# Patient Record
Sex: Male | Born: 1950
Health system: Southern US, Community
[De-identification: ages and names within clinical notes are randomized; demographics above are authoritative.]

## PROBLEM LIST (undated history)

## (undated) DIAGNOSIS — M1990 Unspecified osteoarthritis, unspecified site: Secondary | ICD-10-CM

## (undated) DIAGNOSIS — G629 Polyneuropathy, unspecified: Secondary | ICD-10-CM

## (undated) DIAGNOSIS — G709 Myoneural disorder, unspecified: Secondary | ICD-10-CM

## (undated) DIAGNOSIS — I10 Essential (primary) hypertension: Secondary | ICD-10-CM

## (undated) DIAGNOSIS — J8410 Pulmonary fibrosis, unspecified: Secondary | ICD-10-CM

## (undated) DIAGNOSIS — I4891 Unspecified atrial fibrillation: Secondary | ICD-10-CM

## (undated) DIAGNOSIS — G43909 Migraine, unspecified, not intractable, without status migrainosus: Secondary | ICD-10-CM

## (undated) DIAGNOSIS — K8020 Calculus of gallbladder without cholecystitis without obstruction: Secondary | ICD-10-CM

## (undated) DIAGNOSIS — E785 Hyperlipidemia, unspecified: Secondary | ICD-10-CM

## (undated) DIAGNOSIS — G4733 Obstructive sleep apnea (adult) (pediatric): Secondary | ICD-10-CM

## (undated) DIAGNOSIS — I480 Paroxysmal atrial fibrillation: Secondary | ICD-10-CM

## (undated) DIAGNOSIS — K315 Obstruction of duodenum: Secondary | ICD-10-CM

## (undated) DIAGNOSIS — I5032 Chronic diastolic (congestive) heart failure: Secondary | ICD-10-CM

## (undated) DIAGNOSIS — G894 Chronic pain syndrome: Secondary | ICD-10-CM

## (undated) DIAGNOSIS — I272 Pulmonary hypertension, unspecified: Secondary | ICD-10-CM

## (undated) DIAGNOSIS — A809 Acute poliomyelitis, unspecified: Secondary | ICD-10-CM

## (undated) DIAGNOSIS — R0902 Hypoxemia: Secondary | ICD-10-CM

## (undated) DIAGNOSIS — G14 Postpolio syndrome: Secondary | ICD-10-CM

## (undated) DIAGNOSIS — I214 Non-ST elevation (NSTEMI) myocardial infarction: Secondary | ICD-10-CM

## (undated) DIAGNOSIS — I5020 Unspecified systolic (congestive) heart failure: Secondary | ICD-10-CM

## (undated) DIAGNOSIS — M25511 Pain in right shoulder: Secondary | ICD-10-CM

## (undated) DIAGNOSIS — K8033 Calculus of bile duct with acute cholangitis with obstruction: Secondary | ICD-10-CM

## (undated) DIAGNOSIS — Z8659 Personal history of other mental and behavioral disorders: Secondary | ICD-10-CM

## (undated) DIAGNOSIS — B91 Sequelae of poliomyelitis: Secondary | ICD-10-CM

## (undated) DIAGNOSIS — I255 Ischemic cardiomyopathy: Secondary | ICD-10-CM

## (undated) DIAGNOSIS — Z8612 Personal history of poliomyelitis: Secondary | ICD-10-CM

## (undated) DIAGNOSIS — K760 Fatty (change of) liver, not elsewhere classified: Secondary | ICD-10-CM

## (undated) DIAGNOSIS — G4730 Sleep apnea, unspecified: Secondary | ICD-10-CM

## (undated) DIAGNOSIS — I2510 Atherosclerotic heart disease of native coronary artery without angina pectoris: Secondary | ICD-10-CM

## (undated) DIAGNOSIS — K219 Gastro-esophageal reflux disease without esophagitis: Secondary | ICD-10-CM

## (undated) DIAGNOSIS — Z9861 Coronary angioplasty status: Principal | ICD-10-CM

## (undated) DIAGNOSIS — J189 Pneumonia, unspecified organism: Secondary | ICD-10-CM

## (undated) DIAGNOSIS — Z8739 Personal history of other diseases of the musculoskeletal system and connective tissue: Secondary | ICD-10-CM

## (undated) DIAGNOSIS — K810 Acute cholecystitis: Secondary | ICD-10-CM

## (undated) HISTORY — DX: Personal history of other mental and behavioral disorders: Z86.59

## (undated) HISTORY — DX: Hypoxemia: R09.02

## (undated) HISTORY — DX: Postpolio syndrome: G14

## (undated) HISTORY — DX: Muscle weakness (generalized): B91

## (undated) HISTORY — PX: CORONARY ANGIOPLASTY WITH STENT PLACEMENT: SHX49

## (undated) HISTORY — DX: Acute poliomyelitis, unspecified: A80.9

## (undated) HISTORY — DX: Unspecified osteoarthritis, unspecified site: M19.90

## (undated) HISTORY — DX: Chronic diastolic (congestive) heart failure: I50.32

## (undated) HISTORY — DX: Obstructive sleep apnea (adult) (pediatric): G47.33

## (undated) HISTORY — DX: Pulmonary fibrosis, unspecified: J84.10

## (undated) HISTORY — DX: Polyneuropathy, unspecified: G62.9

## (undated) HISTORY — DX: Calculus of bile duct with acute cholangitis with obstruction: K80.33

## (undated) HISTORY — DX: Paroxysmal atrial fibrillation: I48.0

## (undated) HISTORY — DX: Acute cholecystitis: K81.0

## (undated) HISTORY — DX: Obstruction of duodenum: K31.5

## (undated) HISTORY — DX: Sleep apnea, unspecified: G47.30

## (undated) HISTORY — PX: OTHER SURGICAL HISTORY: SHX169

## (undated) HISTORY — DX: Personal history of other diseases of the musculoskeletal system and connective tissue: Z87.39

## (undated) HISTORY — DX: Non-ST elevation (NSTEMI) myocardial infarction: I21.4

## (undated) HISTORY — DX: Unspecified systolic (congestive) heart failure: I50.20

## (undated) HISTORY — DX: Pain in right shoulder: M25.511

## (undated) HISTORY — DX: Migraine, unspecified, not intractable, without status migrainosus: G43.909

## (undated) HISTORY — DX: Atherosclerotic heart disease of native coronary artery without angina pectoris: I25.10

## (undated) HISTORY — DX: Hyperlipidemia, unspecified: E78.5

## (undated) HISTORY — DX: Personal history of other diseases of the musculoskeletal system and connective tissue: Z86.12

## (undated) HISTORY — DX: Pulmonary hypertension, unspecified: I27.20

## (undated) HISTORY — DX: Coronary angioplasty status: Z98.61

## (undated) HISTORY — DX: Chronic pain syndrome: G89.4

## (undated) HISTORY — DX: Unspecified atrial fibrillation: I48.91

## (undated) HISTORY — DX: Calculus of gallbladder without cholecystitis without obstruction: K80.20

---

## 2000-06-01 HISTORY — PX: UVULOPALATOPHARYNGOPLASTY, TONSILLECTOMY AND SEPTOPLASTY: SHX2632

## 2011-03-05 ENCOUNTER — Encounter: Payer: Self-pay | Admitting: Family Medicine

## 2011-03-05 ENCOUNTER — Ambulatory Visit (INDEPENDENT_AMBULATORY_CARE_PROVIDER_SITE_OTHER): Payer: Medicare Other | Admitting: Family Medicine

## 2011-03-05 VITALS — BP 114/70 | HR 72 | Temp 98.2°F | Ht 70.0 in | Wt 242.8 lb

## 2011-03-05 DIAGNOSIS — G14 Postpolio syndrome: Secondary | ICD-10-CM | POA: Insufficient documentation

## 2011-03-05 DIAGNOSIS — I2510 Atherosclerotic heart disease of native coronary artery without angina pectoris: Secondary | ICD-10-CM

## 2011-03-05 DIAGNOSIS — B91 Sequelae of poliomyelitis: Secondary | ICD-10-CM | POA: Insufficient documentation

## 2011-03-05 DIAGNOSIS — Z125 Encounter for screening for malignant neoplasm of prostate: Secondary | ICD-10-CM

## 2011-03-05 DIAGNOSIS — G43909 Migraine, unspecified, not intractable, without status migrainosus: Secondary | ICD-10-CM | POA: Insufficient documentation

## 2011-03-05 DIAGNOSIS — E785 Hyperlipidemia, unspecified: Secondary | ICD-10-CM | POA: Insufficient documentation

## 2011-03-05 DIAGNOSIS — G894 Chronic pain syndrome: Secondary | ICD-10-CM | POA: Insufficient documentation

## 2011-03-05 DIAGNOSIS — G4733 Obstructive sleep apnea (adult) (pediatric): Secondary | ICD-10-CM | POA: Insufficient documentation

## 2011-03-05 DIAGNOSIS — I10 Essential (primary) hypertension: Secondary | ICD-10-CM

## 2011-03-05 DIAGNOSIS — B3789 Other sites of candidiasis: Secondary | ICD-10-CM | POA: Insufficient documentation

## 2011-03-05 DIAGNOSIS — F329 Major depressive disorder, single episode, unspecified: Secondary | ICD-10-CM | POA: Insufficient documentation

## 2011-03-05 MED ORDER — ASPIRIN-ACETAMINOPHEN-CAFFEINE 250-250-65 MG PO TABS: ORAL_TABLET | ORAL | Status: DC

## 2011-03-05 NOTE — Assessment & Plan Note (Signed)
Chronic. Good control.  Continue meds. Requested records.

## 2011-03-05 NOTE — Assessment & Plan Note (Addendum)
Chronic.  LE weakness L>R. On disability Requests script for new leg braces as has had current one for 33yrs

## 2011-03-05 NOTE — Assessment & Plan Note (Signed)
Chronic. Will await records to fill out oral appliance form.

## 2011-03-05 NOTE — Assessment & Plan Note (Signed)
Requested records. Check FLP when returns fasting for bloodwokr.

## 2011-03-05 NOTE — Assessment & Plan Note (Signed)
Mild groin rash. Treat with lotrimin cream for 2-3 weeks. Update if not improved.

## 2011-03-05 NOTE — Patient Instructions (Addendum)
Call us about cholesterol medicine. I will request records from prior doctor. Return at your convenience for physical, return a few days prior for blood work. Good to meet you today.  Call us with questions. Pass by Marion's office for recommendation on physical therapist for post polio syndrome. For rash - try clotrimazole (lotrimin) cream over the counter for 2 weeks daily, keep area clean and dry as much as able (may use baby powder as well).  Let me know if not imrpoving.

## 2011-03-05 NOTE — Progress Notes (Signed)
Subjective:    Patient ID: Alexander [name], male    DOB: 19-Feb-1951, 60 y.o.   MRN: [id number]  HPI CC: new pt establish  Husband of Robyn Dinino.  Recently moved from [facility].  Previously saw Dr. Vear Clock at [facility] in [place].  Will request blood work.  H/o polio at age 1yo, now with post-polio syndrome bilateral legs.  Trouble walking and falls started 2001.  Retired with long term disability 2002.  Born in [place].  Right leg shorter than left leg (~1 in), right foot larger than left leg.  Chronic pain - lower back, hips, sciatic nerve on left side, neck and shoulders.  Recently established at [facility] pain clinic, Dr. Wyline Beady.  Just switched from methadone/hydrocodone to suboxone.  Evaluated for substance abuse, thought no problems.  Brings evaluation, will scan into system.  H/o migraines.  H/o CAD s/p stents.  Not on aspirin regularly.  Takes excedrin 6/day.  OSA - Received request to fill out OSA form for oral appliance.  States dx with sleep apnea in [place].  Unable to tolerate CPAP machine because feels suffocating when using.  Will request records from sleep study/dx for oral appliance   Preventative: Last CPE - thinks 1 year ago.  Medications and allergies reviewed and updated in chart.  Past histories reviewed and updated if relevant as below. There is no problem list on file for this patient.  Past Medical History  Diagnosis Date  . Arthritis   . Depression   . Headache   . CAD (coronary artery disease)     3 stents  . HTN (hypertension)   . HLD (hyperlipidemia)   . Migraines   . Neuropathy   . Post-polio syndrome   . Shoulder pain, right   . OSA (obstructive sleep apnea)    Past Surgical History  Procedure Date  . Coronary angioplasty with stent placement 1995, 2000, 2005  . Uvulopalatopharyngoplasty, tonsillectomy and septoplasty 2002   History  Substance Use Topics  . Smoking status: Never Smoker   . Smokeless tobacco: Never Used   . Alcohol Use: No   Family History  Problem Relation Age of Onset  . Diabetes Mother   . Cancer Sister 81    male cancer  . Coronary artery disease Neg Hx   . Stroke Neg Hx    No Known Allergies No current outpatient prescriptions on file prior to visit.   Review of Systems  Constitutional: Negative for fever, chills, activity change, appetite change, fatigue and unexpected weight change.  HENT: Negative for hearing loss and neck pain.   Eyes: Negative for visual disturbance.  Respiratory: Negative for cough, chest tightness, shortness of breath and wheezing.   Cardiovascular: Negative for chest pain, palpitations and leg swelling.  Gastrointestinal: Negative for nausea, vomiting, abdominal pain, diarrhea, constipation, blood in stool and abdominal distention.  Genitourinary: Negative for hematuria and difficulty urinating.  Musculoskeletal: Negative for myalgias and arthralgias.  Skin: Positive for rash.  Neurological: Negative for dizziness, seizures, syncope and headaches.  Hematological: Does not bruise/bleed easily.  Psychiatric/Behavioral: Negative for dysphoric mood. The patient is not nervous/anxious.        Objective:   Physical Exam  Nursing note and vitals reviewed. Constitutional: He is oriented to person, place, and time. He appears well-developed and well-nourished. No distress.       Walking with braces  HENT:  Head: Normocephalic and atraumatic.  Right Ear: External ear normal.  Left Ear: External ear normal.  Nose:  Nose normal.  Mouth/Throat: Oropharynx is clear and moist.  Eyes: Conjunctivae and EOM are normal. Pupils are equal, round, and reactive to light.  Neck: Normal range of motion. Neck supple. Carotid bruit is not present. No thyromegaly present.  Cardiovascular: Normal rate, regular rhythm, normal heart sounds and intact distal pulses.   No murmur heard. Pulses:      Radial pulses are 2+ on the right side, and 2+ on the left side.    Pulmonary/Chest: Effort normal and breath sounds normal. No respiratory distress. He has no wheezes. He has no rales.  Abdominal: Soft. Bowel sounds are normal. He exhibits no distension and no mass. There is no tenderness. There is no rebound and no guarding.  Lymphadenopathy:    He has no cervical adenopathy.  Neurological: He is alert and oriented to person, place, and time. No sensory deficit. He displays no seizure activity.       CN grossly intact, atrophy bilateral legs.  RLE 4+/5 strength.  LLE 3/5 strength  Skin: Skin is warm and dry. Rash noted.          Mild erythematous rash in groin at folds of skin, pruritic  Psychiatric: He has a normal mood and affect. His behavior is normal. Judgment and thought content normal.      Assessment & Plan:

## 2011-03-05 NOTE — Assessment & Plan Note (Addendum)
Followed by [facility].

## 2011-03-05 NOTE — Assessment & Plan Note (Signed)
Requested records. States takes regular excedrin for ASA.

## 2011-03-06 ENCOUNTER — Encounter: Payer: Self-pay | Admitting: Family Medicine

## 2011-06-04 ENCOUNTER — Encounter: Payer: Self-pay | Admitting: Family Medicine

## 2011-06-04 ENCOUNTER — Ambulatory Visit (INDEPENDENT_AMBULATORY_CARE_PROVIDER_SITE_OTHER): Payer: Medicare Other | Admitting: Family Medicine

## 2011-06-04 DIAGNOSIS — E785 Hyperlipidemia, unspecified: Secondary | ICD-10-CM

## 2011-06-04 DIAGNOSIS — M542 Cervicalgia: Secondary | ICD-10-CM | POA: Diagnosis not present

## 2011-06-04 DIAGNOSIS — G894 Chronic pain syndrome: Secondary | ICD-10-CM

## 2011-06-04 DIAGNOSIS — G43909 Migraine, unspecified, not intractable, without status migrainosus: Secondary | ICD-10-CM | POA: Diagnosis not present

## 2011-06-04 DIAGNOSIS — Z79899 Other long term (current) drug therapy: Secondary | ICD-10-CM | POA: Diagnosis not present

## 2011-06-04 DIAGNOSIS — H81399 Other peripheral vertigo, unspecified ear: Secondary | ICD-10-CM | POA: Diagnosis not present

## 2011-06-04 DIAGNOSIS — G540 Brachial plexus disorders: Secondary | ICD-10-CM | POA: Diagnosis not present

## 2011-06-04 DIAGNOSIS — I10 Essential (primary) hypertension: Secondary | ICD-10-CM | POA: Diagnosis not present

## 2011-06-04 DIAGNOSIS — G541 Lumbosacral plexus disorders: Secondary | ICD-10-CM | POA: Diagnosis not present

## 2011-06-04 DIAGNOSIS — G608 Other hereditary and idiopathic neuropathies: Secondary | ICD-10-CM | POA: Diagnosis not present

## 2011-06-04 MED ORDER — GABAPENTIN 600 MG PO TABS: 1200.0000 mg | ORAL_TABLET | Freq: Three times a day (TID) | ORAL | Status: DC

## 2011-06-04 MED ORDER — SILDENAFIL CITRATE 100 MG PO TABS: 100.0000 mg | ORAL_TABLET | Freq: Every day | ORAL | Status: DC | PRN

## 2011-06-04 MED ORDER — SIMVASTATIN 40 MG PO TABS: 40.0000 mg | ORAL_TABLET | Freq: Every day | ORAL | Status: DC

## 2011-06-04 MED ORDER — LISINOPRIL 10 MG PO TABS: 10.0000 mg | ORAL_TABLET | Freq: Every day | ORAL | Status: DC

## 2011-06-04 MED ORDER — DULOXETINE HCL 60 MG PO CPEP: 60.0000 mg | ORAL_CAPSULE | Freq: Every day | ORAL | Status: DC

## 2011-06-04 NOTE — Assessment & Plan Note (Signed)
Triggers include smoke On fioricet and excedrin.  Reviewed total aspirin and tylenol dose daily.  Cautioned as taking >1000mg  aspirin daily.

## 2011-06-04 NOTE — Patient Instructions (Signed)
meds refilled today. Return at your convenience fasting for blood work afterwards for physical. Good to see you today, call us with quesitons.

## 2011-06-04 NOTE — Assessment & Plan Note (Signed)
Refilled meds x 1 year, sent to [facility] per pt request

## 2011-06-04 NOTE — Progress Notes (Signed)
Subjective:    Patient ID: Alexander [name], male    DOB: [birth year], 61 y.o.   MRN: [id number]  HPI CC: med refill  Presents for med refills.  Would like all meds except pain meds refilled today, [facility]  H/o post polio syndrome.  Pending new leg braces.  I have still not received records from prior PCP.  Feels stable on current pain med regimen.  [facility].  Reviewed all meds.  On high dose gabapentin for last 8 yrs, tolerating well.  Reviewed dose of aspirin and of tylenol.  Denies any stomach issues, reflux, pain, bleeding.   Constipation - Taking metamucil fiber daily.  H/o CAD s/p stents. Not on aspirin regularly. Takes excedrin 6/day.  OSA - using plastic mouth guard at night.  States dx with sleep apnea in [place]. Unable to tolerate CPAP machine because feels suffocating when using, tried oral appliance but did not like noise.  Due for CPE.  No recent blood work.  No recent physical.  Will return for this.  Medications and allergies reviewed and updated in chart.  Past histories reviewed and updated if relevant as below. Patient Active Problem List  Diagnoses  . Post-polio syndrome  . Chronic pain syndrome  . OSA (obstructive sleep apnea)  . Migraines  . HLD (hyperlipidemia)  . HTN (hypertension)  . CAD (coronary artery disease)  . Depression  . Candida rash of groin   Past Medical History  Diagnosis Date  . Arthritis   . Depression   . Headache   . CAD (coronary artery disease)     3 stents  . HTN (hypertension)   . HLD (hyperlipidemia)   . Migraines   . Neuropathy   . Post-polio syndrome   . Shoulder pain, right   . OSA (obstructive sleep apnea)     does not want to use CPAP, using mouth guard  . Chronic pain syndrome     [facility]   Past Surgical History  Procedure Date  . Coronary angioplasty with stent placement 1995, 2000, 2005  . Uvulopalatopharyngoplasty, tonsillectomy and septoplasty 2002     History  Substance Use Topics  . Smoking status: Never Smoker   . Smokeless tobacco: Never Used  . Alcohol Use: No   Family History  Problem Relation Age of Onset  . Diabetes Mother   . Cancer Sister 49    male cancer  . Coronary artery disease Neg Hx   . Stroke Neg Hx    No Known Allergies Current Outpatient Prescriptions on File Prior to Visit  Medication Sig Dispense Refill  . butalbital-acetaminophen-caffeine (FIORICET WITH CODEINE) 50-325-40-30 MG per capsule Take 1 capsule by mouth 3 (three) times daily as needed.        Marland Kitchen aspirin-acetaminophen-caffeine (EXCEDRIN MIGRAINE) 250-250-65 MG per tablet 6 daily      . baclofen (LIORESAL) 10 MG tablet Take 10 mg by mouth 3 (three) times daily.       . buprenorphine-naloxone (SUBOXONE) 8-2 MG SUBL Place 1 tablet under the tongue 2 (two) times daily.        . psyllium (METAMUCIL) 58.6 % powder Take 1 packet by mouth 2 (two) times daily.         Review of Systems Per HPI    Objective:   Physical Exam  Nursing note and vitals reviewed. Constitutional: He appears well-developed and well-nourished. No distress.  Musculoskeletal:       LE atrophy,  using old leg braces and cuitches  Psychiatric: He has a normal mood and affect.       Assessment & Plan:

## 2011-06-04 NOTE — Assessment & Plan Note (Signed)
Seems doing well.  Followed by [facility].

## 2011-06-04 NOTE — Assessment & Plan Note (Signed)
Check FLP when returns fasting for blood work. Still have not received records from prior PCP.

## 2011-06-08 ENCOUNTER — Telehealth: Payer: Self-pay | Admitting: Family Medicine

## 2011-06-08 NOTE — Telephone Encounter (Signed)
Confirmed with patient's wife. (It should've said "yes"). The form still needed one more item from the patient in order to be completed. Patient's wife came in for appt today. Returned original form to her for patient to complete. Copy made for our records.

## 2011-06-08 NOTE — Telephone Encounter (Signed)
Can we call pt to verify question #1 on form, he answered no but I think meant to answer yes.  Then fax form and scan copy for chart, provide copy for pt if desired.

## 2011-06-30 ENCOUNTER — Other Ambulatory Visit: Payer: Medicare Other

## 2011-07-02 DIAGNOSIS — G540 Brachial plexus disorders: Secondary | ICD-10-CM | POA: Diagnosis not present

## 2011-07-02 DIAGNOSIS — H81399 Other peripheral vertigo, unspecified ear: Secondary | ICD-10-CM | POA: Diagnosis not present

## 2011-07-02 DIAGNOSIS — G541 Lumbosacral plexus disorders: Secondary | ICD-10-CM | POA: Diagnosis not present

## 2011-07-02 DIAGNOSIS — G608 Other hereditary and idiopathic neuropathies: Secondary | ICD-10-CM | POA: Diagnosis not present

## 2011-07-06 ENCOUNTER — Encounter: Payer: Medicare Other | Admitting: Family Medicine

## 2011-07-28 DIAGNOSIS — G541 Lumbosacral plexus disorders: Secondary | ICD-10-CM | POA: Diagnosis not present

## 2011-07-28 DIAGNOSIS — G608 Other hereditary and idiopathic neuropathies: Secondary | ICD-10-CM | POA: Diagnosis not present

## 2011-07-28 DIAGNOSIS — G540 Brachial plexus disorders: Secondary | ICD-10-CM | POA: Diagnosis not present

## 2011-07-28 DIAGNOSIS — H81399 Other peripheral vertigo, unspecified ear: Secondary | ICD-10-CM | POA: Diagnosis not present

## 2011-08-25 DIAGNOSIS — G540 Brachial plexus disorders: Secondary | ICD-10-CM | POA: Diagnosis not present

## 2011-08-25 DIAGNOSIS — H81399 Other peripheral vertigo, unspecified ear: Secondary | ICD-10-CM | POA: Diagnosis not present

## 2011-08-25 DIAGNOSIS — G541 Lumbosacral plexus disorders: Secondary | ICD-10-CM | POA: Diagnosis not present

## 2011-08-25 DIAGNOSIS — G608 Other hereditary and idiopathic neuropathies: Secondary | ICD-10-CM | POA: Diagnosis not present

## 2011-09-29 ENCOUNTER — Telehealth: Payer: Self-pay | Admitting: *Deleted

## 2011-09-29 NOTE — Telephone Encounter (Signed)
Filled and placed in Kim's box. 

## 2011-09-29 NOTE — Telephone Encounter (Signed)
Handicapped tag form in your IN box for completion. Please notify patient when ready.

## 2011-09-30 NOTE — Telephone Encounter (Signed)
Message left notifying patient. Paperwork copied and placed up front for pick up.

## 2011-10-06 DIAGNOSIS — G608 Other hereditary and idiopathic neuropathies: Secondary | ICD-10-CM | POA: Diagnosis not present

## 2011-10-06 DIAGNOSIS — Z79899 Other long term (current) drug therapy: Secondary | ICD-10-CM | POA: Diagnosis not present

## 2011-10-06 DIAGNOSIS — G541 Lumbosacral plexus disorders: Secondary | ICD-10-CM | POA: Diagnosis not present

## 2011-10-06 DIAGNOSIS — M542 Cervicalgia: Secondary | ICD-10-CM | POA: Diagnosis not present

## 2011-10-06 DIAGNOSIS — H81399 Other peripheral vertigo, unspecified ear: Secondary | ICD-10-CM | POA: Diagnosis not present

## 2011-10-06 DIAGNOSIS — G540 Brachial plexus disorders: Secondary | ICD-10-CM | POA: Diagnosis not present

## 2011-11-03 DIAGNOSIS — H81399 Other peripheral vertigo, unspecified ear: Secondary | ICD-10-CM | POA: Diagnosis not present

## 2011-11-03 DIAGNOSIS — G541 Lumbosacral plexus disorders: Secondary | ICD-10-CM | POA: Diagnosis not present

## 2011-11-03 DIAGNOSIS — G608 Other hereditary and idiopathic neuropathies: Secondary | ICD-10-CM | POA: Diagnosis not present

## 2011-11-03 DIAGNOSIS — G540 Brachial plexus disorders: Secondary | ICD-10-CM | POA: Diagnosis not present

## 2011-12-01 DIAGNOSIS — G541 Lumbosacral plexus disorders: Secondary | ICD-10-CM | POA: Diagnosis not present

## 2011-12-01 DIAGNOSIS — H81399 Other peripheral vertigo, unspecified ear: Secondary | ICD-10-CM | POA: Diagnosis not present

## 2011-12-01 DIAGNOSIS — G608 Other hereditary and idiopathic neuropathies: Secondary | ICD-10-CM | POA: Diagnosis not present

## 2011-12-01 DIAGNOSIS — G540 Brachial plexus disorders: Secondary | ICD-10-CM | POA: Diagnosis not present

## 2011-12-29 DIAGNOSIS — G540 Brachial plexus disorders: Secondary | ICD-10-CM | POA: Diagnosis not present

## 2011-12-29 DIAGNOSIS — H81399 Other peripheral vertigo, unspecified ear: Secondary | ICD-10-CM | POA: Diagnosis not present

## 2011-12-29 DIAGNOSIS — G541 Lumbosacral plexus disorders: Secondary | ICD-10-CM | POA: Diagnosis not present

## 2011-12-29 DIAGNOSIS — G608 Other hereditary and idiopathic neuropathies: Secondary | ICD-10-CM | POA: Diagnosis not present

## 2012-01-26 DIAGNOSIS — R269 Unspecified abnormalities of gait and mobility: Secondary | ICD-10-CM | POA: Diagnosis not present

## 2012-01-26 DIAGNOSIS — G540 Brachial plexus disorders: Secondary | ICD-10-CM | POA: Diagnosis not present

## 2012-01-26 DIAGNOSIS — Z79899 Other long term (current) drug therapy: Secondary | ICD-10-CM | POA: Diagnosis not present

## 2012-01-26 DIAGNOSIS — G541 Lumbosacral plexus disorders: Secondary | ICD-10-CM | POA: Diagnosis not present

## 2012-01-26 DIAGNOSIS — G608 Other hereditary and idiopathic neuropathies: Secondary | ICD-10-CM | POA: Diagnosis not present

## 2012-01-26 DIAGNOSIS — Z9181 History of falling: Secondary | ICD-10-CM | POA: Diagnosis not present

## 2012-01-26 DIAGNOSIS — H814 Vertigo of central origin: Secondary | ICD-10-CM | POA: Diagnosis not present

## 2012-01-26 DIAGNOSIS — H81399 Other peripheral vertigo, unspecified ear: Secondary | ICD-10-CM | POA: Diagnosis not present

## 2012-01-27 DIAGNOSIS — G541 Lumbosacral plexus disorders: Secondary | ICD-10-CM | POA: Diagnosis not present

## 2012-01-27 DIAGNOSIS — H81399 Other peripheral vertigo, unspecified ear: Secondary | ICD-10-CM | POA: Diagnosis not present

## 2012-01-27 DIAGNOSIS — G608 Other hereditary and idiopathic neuropathies: Secondary | ICD-10-CM | POA: Diagnosis not present

## 2012-01-27 DIAGNOSIS — G540 Brachial plexus disorders: Secondary | ICD-10-CM | POA: Diagnosis not present

## 2012-02-16 DIAGNOSIS — S344XXA Injury of lumbosacral plexus, initial encounter: Secondary | ICD-10-CM | POA: Diagnosis not present

## 2012-02-16 DIAGNOSIS — G547 Phantom limb syndrome without pain: Secondary | ICD-10-CM | POA: Diagnosis not present

## 2012-02-16 DIAGNOSIS — G8928 Other chronic postprocedural pain: Secondary | ICD-10-CM | POA: Diagnosis not present

## 2012-02-16 DIAGNOSIS — G894 Chronic pain syndrome: Secondary | ICD-10-CM | POA: Diagnosis not present

## 2012-02-23 DIAGNOSIS — G540 Brachial plexus disorders: Secondary | ICD-10-CM | POA: Diagnosis not present

## 2012-02-23 DIAGNOSIS — G894 Chronic pain syndrome: Secondary | ICD-10-CM | POA: Diagnosis not present

## 2012-02-23 DIAGNOSIS — G8928 Other chronic postprocedural pain: Secondary | ICD-10-CM | POA: Diagnosis not present

## 2012-02-23 DIAGNOSIS — G541 Lumbosacral plexus disorders: Secondary | ICD-10-CM | POA: Diagnosis not present

## 2012-03-24 DIAGNOSIS — G540 Brachial plexus disorders: Secondary | ICD-10-CM | POA: Diagnosis not present

## 2012-03-24 DIAGNOSIS — G8928 Other chronic postprocedural pain: Secondary | ICD-10-CM | POA: Diagnosis not present

## 2012-03-24 DIAGNOSIS — G541 Lumbosacral plexus disorders: Secondary | ICD-10-CM | POA: Diagnosis not present

## 2012-03-24 DIAGNOSIS — G894 Chronic pain syndrome: Secondary | ICD-10-CM | POA: Diagnosis not present

## 2012-03-28 DIAGNOSIS — M542 Cervicalgia: Secondary | ICD-10-CM | POA: Diagnosis not present

## 2012-03-28 DIAGNOSIS — B91 Sequelae of poliomyelitis: Secondary | ICD-10-CM | POA: Diagnosis not present

## 2012-03-28 DIAGNOSIS — G894 Chronic pain syndrome: Secondary | ICD-10-CM | POA: Diagnosis not present

## 2012-03-28 DIAGNOSIS — M545 Low back pain, unspecified: Secondary | ICD-10-CM | POA: Diagnosis not present

## 2012-04-07 ENCOUNTER — Other Ambulatory Visit: Payer: Self-pay | Admitting: Anesthesiology

## 2012-04-07 DIAGNOSIS — M542 Cervicalgia: Secondary | ICD-10-CM

## 2012-04-13 ENCOUNTER — Ambulatory Visit
Admission: RE | Admit: 2012-04-13 | Discharge: 2012-04-13 | Disposition: A | Discharge status: Home / Self Care | Payer: Medicare Other | Source: Ambulatory Visit | Attending: Anesthesiology | Admitting: Anesthesiology

## 2012-04-13 DIAGNOSIS — M47812 Spondylosis without myelopathy or radiculopathy, cervical region: Secondary | ICD-10-CM | POA: Diagnosis not present

## 2012-04-13 DIAGNOSIS — M542 Cervicalgia: Secondary | ICD-10-CM

## 2012-04-13 DIAGNOSIS — M5030 Other cervical disc degeneration, unspecified cervical region: Secondary | ICD-10-CM | POA: Diagnosis not present

## 2012-05-03 ENCOUNTER — Ambulatory Visit (INDEPENDENT_AMBULATORY_CARE_PROVIDER_SITE_OTHER): Payer: Medicare Other | Admitting: Family Medicine

## 2012-05-03 ENCOUNTER — Encounter: Payer: Self-pay | Admitting: Family Medicine

## 2012-05-03 VITALS — BP 122/72 | HR 84 | Temp 98.6°F | Wt 236.0 lb

## 2012-05-03 DIAGNOSIS — G540 Brachial plexus disorders: Secondary | ICD-10-CM | POA: Diagnosis not present

## 2012-05-03 DIAGNOSIS — G14 Postpolio syndrome: Secondary | ICD-10-CM

## 2012-05-03 DIAGNOSIS — I10 Essential (primary) hypertension: Secondary | ICD-10-CM

## 2012-05-03 DIAGNOSIS — G541 Lumbosacral plexus disorders: Secondary | ICD-10-CM | POA: Diagnosis not present

## 2012-05-03 DIAGNOSIS — F529 Unspecified sexual dysfunction not due to a substance or known physiological condition: Secondary | ICD-10-CM

## 2012-05-03 DIAGNOSIS — Z23 Encounter for immunization: Secondary | ICD-10-CM | POA: Diagnosis not present

## 2012-05-03 DIAGNOSIS — N539 Unspecified male sexual dysfunction: Secondary | ICD-10-CM | POA: Insufficient documentation

## 2012-05-03 DIAGNOSIS — Z79899 Other long term (current) drug therapy: Secondary | ICD-10-CM | POA: Diagnosis not present

## 2012-05-03 DIAGNOSIS — I2510 Atherosclerotic heart disease of native coronary artery without angina pectoris: Secondary | ICD-10-CM

## 2012-05-03 DIAGNOSIS — G894 Chronic pain syndrome: Secondary | ICD-10-CM | POA: Diagnosis not present

## 2012-05-03 DIAGNOSIS — B91 Sequelae of poliomyelitis: Secondary | ICD-10-CM

## 2012-05-03 DIAGNOSIS — E785 Hyperlipidemia, unspecified: Secondary | ICD-10-CM

## 2012-05-03 DIAGNOSIS — G8928 Other chronic postprocedural pain: Secondary | ICD-10-CM | POA: Diagnosis not present

## 2012-05-03 MED ORDER — BACLOFEN 10 MG PO TABS: 10.0000 mg | ORAL_TABLET | Freq: Three times a day (TID) | ORAL | Status: DC | PRN

## 2012-05-03 MED ORDER — GABAPENTIN 600 MG PO TABS: 1200.0000 mg | ORAL_TABLET | Freq: Three times a day (TID) | ORAL | Status: DC

## 2012-05-03 MED ORDER — SIMVASTATIN 40 MG PO TABS: 40.0000 mg | ORAL_TABLET | Freq: Every day | ORAL | Status: DC

## 2012-05-03 MED ORDER — LISINOPRIL 10 MG PO TABS: 10.0000 mg | ORAL_TABLET | Freq: Every day | ORAL | Status: DC

## 2012-05-03 MED ORDER — DULOXETINE HCL 30 MG PO CPEP: 30.0000 mg | ORAL_CAPSULE | Freq: Every day | ORAL | Status: DC

## 2012-05-03 NOTE — Progress Notes (Signed)
Subjective:    Patient ID: Alexander [name], male    DOB: [birth year], 61 y.o.   MRN: [id number]  HPI CC: med refill  Presents with Alexander [name] today.  Sees Dr. Eather Colas for pain management.  Post polio syndrome L>R.  H/o chronic back pain.  Recently had spinal stimulator done which worked well for lower spine.  Pending scheduling upper spine one  Using excedrin migraine with aspirin up to 12 daily as well as motrin 600mg  about 3 times daily.  Rarely uses fioricet.  HTN - compliant with lisinopril 10mg  daily.  Chronic headaches.  No vision changes, CP/tightness, SOB, leg swelling.  CAD - on aspirin but only amt in excedrin amount (was taking up to 12 daily).  Denies chest pain.  Sexual dysfunction - endorsing about 1 yr h/o inability to achieve orgasm.  No trouble maintaining erection when viagra used.  Has been on cymbalta and lisinopriil long term.  Doesn't bother pt but bothers wife.  Tetanus and flu shot today  Medications and allergies reviewed and updated in chart.  Past histories reviewed and updated if relevant as below. Patient Active Problem List  Diagnosis  . Post-polio syndrome  . Chronic pain syndrome  . OSA (obstructive sleep apnea)  . Migraines  . HLD (hyperlipidemia)  . HTN (hypertension)  . CAD (coronary artery disease)  . Depression  . Candida rash of groin   Past Medical History  Diagnosis Date  . Arthritis   . Depression   . Headache   . CAD (coronary artery disease)     3 stents  . HTN (hypertension)   . HLD (hyperlipidemia)   . Migraines   . Neuropathy   . Post-polio syndrome   . Shoulder pain, right   . OSA (obstructive sleep apnea)     does not want to use CPAP, using mouth guard  . Chronic pain syndrome     [facility]   Past Surgical History  Procedure Date  . Coronary angioplasty with stent placement 1995, 2000, 2005  . Uvulopalatopharyngoplasty, tonsillectomy and septoplasty 2002   History  Substance Use Topics  . Smoking status: Never  Smoker   . Smokeless tobacco: Never Used  . Alcohol Use: No   Family History  Problem Relation Age of Onset  . Diabetes Mother   . Cancer Sister 102    male cancer  . Coronary artery disease Neg Hx   . Stroke Neg Hx    No Known Allergies Current Outpatient Prescriptions on File Prior to Visit  Medication Sig Dispense Refill  . aspirin-acetaminophen-caffeine (EXCEDRIN MIGRAINE) 250-250-65 MG per tablet 6 daily      . baclofen (LIORESAL) 10 MG tablet Take 10 mg by mouth 3 (three) times daily as needed.       . buprenorphine-naloxone (SUBOXONE) 8-2 MG SUBL Place 1 tablet under the tongue 2 (two) times daily.        . butalbital-acetaminophen-caffeine (FIORICET WITH CODEINE) 50-325-40-30 MG per capsule Take 1 capsule by mouth 3 (three) times daily as needed.        . DULoxetine (CYMBALTA) 60 MG capsule Take 1 capsule (60 mg total) by mouth daily.  90 capsule  3  . gabapentin (NEURONTIN) 600 MG tablet Take 2 tablets (1,200 mg total) by mouth 3 (three) times daily.  540 tablet  3  . lisinopril (PRINIVIL,ZESTRIL) 10 MG tablet Take 1 tablet (10 mg total) by mouth daily.  90 tablet  3  . psyllium (METAMUCIL) 58.6 % powder Take 1  packet by mouth 2 (two) times daily.        . sildenafil (VIAGRA) 100 MG tablet Take 1 tablet (100 mg total) by mouth daily as needed.  10 tablet  3  . simvastatin (ZOCOR) 40 MG tablet Take 1 tablet (40 mg total) by mouth at bedtime.  90 tablet  3     Review of Systems Per HPI    Objective:   Physical Exam  Nursing note and vitals reviewed. Constitutional: He appears well-developed and well-nourished. No distress.  HENT:  Head: Normocephalic and atraumatic.  Mouth/Throat: Oropharynx is clear and moist. No oropharyngeal exudate.  Eyes: Conjunctivae normal and EOM are normal. Pupils are equal, round, and reactive to light. No scleral icterus.  Neck: Normal range of motion. Neck supple.  Cardiovascular: Normal rate, regular rhythm, normal heart sounds and intact  distal pulses.   No murmur heard. Pulmonary/Chest: Effort normal and breath sounds normal. No respiratory distress. He has no wheezes. He has no rales.  Musculoskeletal: He exhibits no edema.       BLE muscle atrophy, braces in place  Lymphadenopathy:    He has no cervical adenopathy.  Skin: Skin is warm and dry. No rash noted.  Psychiatric: He has a normal mood and affect.       Assessment & Plan:

## 2012-05-03 NOTE — Assessment & Plan Note (Signed)
Asxs.  Discussed use of NSAID + aspirin decreases effectiveness of antiplatelet function.

## 2012-05-03 NOTE — Assessment & Plan Note (Signed)
Chronic, stable. Continue meds. 

## 2012-05-03 NOTE — Assessment & Plan Note (Signed)
H/o ED, now with anorgasmia. Could be med related - back down on cymbalta to 30mg  daily - reassess at CPE. Consider uro referral. ?post-polio syndrome sequelae?

## 2012-05-03 NOTE — Patient Instructions (Addendum)
Refilled meds today. Try lower dose of cymbalta and let me know how this works. Flu shot, tetanus shots today. Return at your convenience fasting for blood work ,afterwards for physical.

## 2012-05-03 NOTE — Assessment & Plan Note (Signed)
Chronic. LE weakness L>R. Stable. Continue to monitor.

## 2012-05-03 NOTE — Assessment & Plan Note (Addendum)
[facility]ed [facility]. Discussed concerns with significant amt of excedrin he uses (up to 12 per day which is total acetaminophen 3700mg  daily).  Pt will back down.  Check liver today.

## 2012-05-04 ENCOUNTER — Other Ambulatory Visit: Payer: Self-pay | Admitting: Family Medicine

## 2012-05-04 MED ORDER — DULOXETINE HCL 30 MG PO CPEP: 30.0000 mg | ORAL_CAPSULE | Freq: Every day | ORAL | Status: DC

## 2012-05-04 NOTE — Telephone Encounter (Signed)
escribe error - please phone in.  New lower dose is 30mg  daily cymbalta.

## 2012-05-06 ENCOUNTER — Other Ambulatory Visit (INDEPENDENT_AMBULATORY_CARE_PROVIDER_SITE_OTHER): Payer: Medicare Other

## 2012-05-06 DIAGNOSIS — E785 Hyperlipidemia, unspecified: Secondary | ICD-10-CM

## 2012-05-06 DIAGNOSIS — I10 Essential (primary) hypertension: Secondary | ICD-10-CM | POA: Diagnosis not present

## 2012-05-06 LAB — LIPID PANEL: VLDL: 48.2 mg/dL — ABNORMAL HIGH (ref 0.0–40.0)

## 2012-05-06 LAB — LDL CHOLESTEROL, DIRECT: Direct LDL: 137.1 mg/dL

## 2012-05-06 LAB — COMPREHENSIVE METABOLIC PANEL
AST: 26 U/L (ref 0–37)
Albumin: 4.3 g/dL (ref 3.5–5.2)
BUN: 12 mg/dL (ref 6–23)
Calcium: 9.2 mg/dL (ref 8.4–10.5)
Chloride: 103 mEq/L (ref 96–112)
Glucose, Bld: 125 mg/dL — ABNORMAL HIGH (ref 70–99)
Potassium: 4.1 mEq/L (ref 3.5–5.1)

## 2012-05-09 ENCOUNTER — Encounter: Payer: Self-pay | Admitting: Family Medicine

## 2012-05-09 DIAGNOSIS — R7303 Prediabetes: Secondary | ICD-10-CM | POA: Insufficient documentation

## 2012-05-10 ENCOUNTER — Encounter: Payer: Self-pay | Admitting: *Deleted

## 2012-05-31 DIAGNOSIS — G540 Brachial plexus disorders: Secondary | ICD-10-CM | POA: Diagnosis not present

## 2012-05-31 DIAGNOSIS — G541 Lumbosacral plexus disorders: Secondary | ICD-10-CM | POA: Diagnosis not present

## 2012-05-31 DIAGNOSIS — G8928 Other chronic postprocedural pain: Secondary | ICD-10-CM | POA: Diagnosis not present

## 2012-05-31 DIAGNOSIS — G894 Chronic pain syndrome: Secondary | ICD-10-CM | POA: Diagnosis not present

## 2012-06-23 DIAGNOSIS — B91 Sequelae of poliomyelitis: Secondary | ICD-10-CM | POA: Diagnosis not present

## 2012-06-24 DIAGNOSIS — G541 Lumbosacral plexus disorders: Secondary | ICD-10-CM | POA: Diagnosis not present

## 2012-06-24 DIAGNOSIS — G894 Chronic pain syndrome: Secondary | ICD-10-CM | POA: Diagnosis not present

## 2012-06-24 DIAGNOSIS — G540 Brachial plexus disorders: Secondary | ICD-10-CM | POA: Diagnosis not present

## 2012-06-24 DIAGNOSIS — G8928 Other chronic postprocedural pain: Secondary | ICD-10-CM | POA: Diagnosis not present

## 2012-06-30 DIAGNOSIS — B91 Sequelae of poliomyelitis: Secondary | ICD-10-CM | POA: Diagnosis not present

## 2012-08-04 DIAGNOSIS — E785 Hyperlipidemia, unspecified: Secondary | ICD-10-CM | POA: Insufficient documentation

## 2012-08-04 DIAGNOSIS — F419 Anxiety disorder, unspecified: Secondary | ICD-10-CM | POA: Insufficient documentation

## 2012-08-08 DIAGNOSIS — Z79899 Other long term (current) drug therapy: Secondary | ICD-10-CM | POA: Diagnosis not present

## 2012-08-08 DIAGNOSIS — G540 Brachial plexus disorders: Secondary | ICD-10-CM | POA: Diagnosis not present

## 2012-08-08 DIAGNOSIS — G541 Lumbosacral plexus disorders: Secondary | ICD-10-CM | POA: Diagnosis not present

## 2012-08-08 DIAGNOSIS — G8928 Other chronic postprocedural pain: Secondary | ICD-10-CM | POA: Diagnosis not present

## 2012-08-08 DIAGNOSIS — G894 Chronic pain syndrome: Secondary | ICD-10-CM | POA: Diagnosis not present

## 2012-08-16 ENCOUNTER — Other Ambulatory Visit: Payer: Self-pay | Admitting: Family Medicine

## 2012-08-23 DIAGNOSIS — Z9181 History of falling: Secondary | ICD-10-CM | POA: Diagnosis not present

## 2012-08-23 DIAGNOSIS — G8928 Other chronic postprocedural pain: Secondary | ICD-10-CM | POA: Diagnosis not present

## 2012-08-23 DIAGNOSIS — G541 Lumbosacral plexus disorders: Secondary | ICD-10-CM | POA: Diagnosis not present

## 2012-08-23 DIAGNOSIS — R269 Unspecified abnormalities of gait and mobility: Secondary | ICD-10-CM | POA: Diagnosis not present

## 2012-08-23 DIAGNOSIS — H81399 Other peripheral vertigo, unspecified ear: Secondary | ICD-10-CM | POA: Diagnosis not present

## 2012-08-23 DIAGNOSIS — G540 Brachial plexus disorders: Secondary | ICD-10-CM | POA: Diagnosis not present

## 2012-08-23 DIAGNOSIS — G894 Chronic pain syndrome: Secondary | ICD-10-CM | POA: Diagnosis not present

## 2012-08-23 DIAGNOSIS — H8190 Unspecified disorder of vestibular function, unspecified ear: Secondary | ICD-10-CM | POA: Diagnosis not present

## 2012-08-30 HISTORY — PX: OTHER SURGICAL HISTORY: SHX169

## 2012-09-05 DIAGNOSIS — G541 Lumbosacral plexus disorders: Secondary | ICD-10-CM | POA: Diagnosis not present

## 2012-09-05 DIAGNOSIS — G894 Chronic pain syndrome: Secondary | ICD-10-CM | POA: Diagnosis not present

## 2012-09-05 DIAGNOSIS — G540 Brachial plexus disorders: Secondary | ICD-10-CM | POA: Diagnosis not present

## 2012-09-05 DIAGNOSIS — G8928 Other chronic postprocedural pain: Secondary | ICD-10-CM | POA: Diagnosis not present

## 2012-09-09 DIAGNOSIS — G4733 Obstructive sleep apnea (adult) (pediatric): Secondary | ICD-10-CM | POA: Diagnosis not present

## 2012-09-09 DIAGNOSIS — B91 Sequelae of poliomyelitis: Secondary | ICD-10-CM | POA: Diagnosis not present

## 2012-09-09 DIAGNOSIS — Z9861 Coronary angioplasty status: Secondary | ICD-10-CM | POA: Diagnosis not present

## 2012-09-09 DIAGNOSIS — G894 Chronic pain syndrome: Secondary | ICD-10-CM | POA: Diagnosis not present

## 2012-09-09 DIAGNOSIS — F329 Major depressive disorder, single episode, unspecified: Secondary | ICD-10-CM | POA: Diagnosis not present

## 2012-09-09 DIAGNOSIS — Z7982 Long term (current) use of aspirin: Secondary | ICD-10-CM | POA: Diagnosis not present

## 2012-09-09 DIAGNOSIS — M545 Low back pain, unspecified: Secondary | ICD-10-CM | POA: Diagnosis not present

## 2012-09-09 DIAGNOSIS — G43909 Migraine, unspecified, not intractable, without status migrainosus: Secondary | ICD-10-CM | POA: Diagnosis not present

## 2012-09-09 DIAGNOSIS — E785 Hyperlipidemia, unspecified: Secondary | ICD-10-CM | POA: Diagnosis not present

## 2012-09-09 DIAGNOSIS — IMO0002 Reserved for concepts with insufficient information to code with codable children: Secondary | ICD-10-CM | POA: Diagnosis not present

## 2012-09-09 DIAGNOSIS — E669 Obesity, unspecified: Secondary | ICD-10-CM | POA: Diagnosis not present

## 2012-09-09 DIAGNOSIS — I2510 Atherosclerotic heart disease of native coronary artery without angina pectoris: Secondary | ICD-10-CM | POA: Diagnosis not present

## 2012-09-09 DIAGNOSIS — F411 Generalized anxiety disorder: Secondary | ICD-10-CM | POA: Diagnosis not present

## 2012-09-09 DIAGNOSIS — I10 Essential (primary) hypertension: Secondary | ICD-10-CM | POA: Diagnosis not present

## 2012-09-21 DIAGNOSIS — B91 Sequelae of poliomyelitis: Secondary | ICD-10-CM | POA: Diagnosis not present

## 2012-10-03 DIAGNOSIS — G894 Chronic pain syndrome: Secondary | ICD-10-CM | POA: Diagnosis not present

## 2012-10-03 DIAGNOSIS — G541 Lumbosacral plexus disorders: Secondary | ICD-10-CM | POA: Diagnosis not present

## 2012-10-03 DIAGNOSIS — G540 Brachial plexus disorders: Secondary | ICD-10-CM | POA: Diagnosis not present

## 2012-10-03 DIAGNOSIS — G8928 Other chronic postprocedural pain: Secondary | ICD-10-CM | POA: Diagnosis not present

## 2012-10-18 DIAGNOSIS — B91 Sequelae of poliomyelitis: Secondary | ICD-10-CM | POA: Diagnosis not present

## 2012-10-18 DIAGNOSIS — M549 Dorsalgia, unspecified: Secondary | ICD-10-CM | POA: Diagnosis not present

## 2013-02-27 ENCOUNTER — Ambulatory Visit (INDEPENDENT_AMBULATORY_CARE_PROVIDER_SITE_OTHER): Payer: Medicare Other | Admitting: Family Medicine

## 2013-02-27 ENCOUNTER — Encounter: Payer: Self-pay | Admitting: Family Medicine

## 2013-02-27 VITALS — BP 142/84 | HR 88 | Temp 98.6°F | Wt 246.8 lb

## 2013-02-27 DIAGNOSIS — R1013 Epigastric pain: Secondary | ICD-10-CM

## 2013-02-27 DIAGNOSIS — R739 Hyperglycemia, unspecified: Secondary | ICD-10-CM

## 2013-02-27 DIAGNOSIS — Z23 Encounter for immunization: Secondary | ICD-10-CM | POA: Diagnosis not present

## 2013-02-27 DIAGNOSIS — I10 Essential (primary) hypertension: Secondary | ICD-10-CM | POA: Diagnosis not present

## 2013-02-27 DIAGNOSIS — R7309 Other abnormal glucose: Secondary | ICD-10-CM

## 2013-02-27 DIAGNOSIS — B91 Sequelae of poliomyelitis: Secondary | ICD-10-CM

## 2013-02-27 DIAGNOSIS — G14 Postpolio syndrome: Secondary | ICD-10-CM

## 2013-02-27 DIAGNOSIS — R5381 Other malaise: Secondary | ICD-10-CM | POA: Diagnosis not present

## 2013-02-27 DIAGNOSIS — E559 Vitamin D deficiency, unspecified: Secondary | ICD-10-CM | POA: Diagnosis not present

## 2013-02-27 DIAGNOSIS — R5383 Other fatigue: Secondary | ICD-10-CM | POA: Insufficient documentation

## 2013-02-27 MED ORDER — METAXALONE 800 MG PO TABS: 400.0000 mg | ORAL_TABLET | Freq: Three times a day (TID) | ORAL | Status: DC

## 2013-02-27 MED ORDER — OMEPRAZOLE 40 MG PO CPDR: 40.0000 mg | DELAYED_RELEASE_CAPSULE | Freq: Every day | ORAL | Status: DC

## 2013-02-27 NOTE — Patient Instructions (Signed)
Let's back down on ibuprofen and excedrin. Use skelaxin instead (samples provided - let me know for script if it helps). Start omeprazole 40mg  daily for 3 weeks. Good to see you - let me know how this does. If symptoms persist, let meknow.

## 2013-02-27 NOTE — Assessment & Plan Note (Addendum)
Does not have typical GERD sxs but anticipate dyspepsia related pain acutely worsened by amount of NSAID and aspirin/caffeine he takes (?GERD vs gastritis vs ulcer) Did have prolonged discussion about NSAID and ASA use as well as caffeine use Recommended slow taper off excedrin (decrease from 3 tablets TID to 2 tablets TID to start) and start omeprazole 40mg  daily for dsypepsia.

## 2013-02-27 NOTE — Progress Notes (Signed)
  Subjective:    Patient ID: Alexander [name], male    DOB: 1950/06/26, 62 y.o.   MRN: [id number]  HPI CC: trouble tolerating foods  Pleasant 62 yo with h/o postpolio syndrome presents today to discuss concerns with certain solid foods.  Feels "like a rock" discomfort in epigastric region.  Intermittent issue.  ie ate [employer] sub several weeks ago, had sxs.  But denies sxs with recent cold cut sandwiches.  Vegetables seem to worsen sxs.  Trouble with [place] food and spicy foods.  +indigestion. Denies dysphagia.  No weight loss.  No early satiety.  No fevers/chills, nausea/vomiting, diarrhea/constipation.  No reflux symptoms.  08/2012 - implant into upper spine (stimulator) for chronic pain.  Was on suboxone.  This was switched to hydrocodone and now is off narcotics.  [facility].  This is helping significantly.  Migraines have improved.  Currently taking 9 excedrin migraine daily as well as ibuprofen 400mg  tid and gabapentin 1200mg  tid for neuropathic pain as well as general "soreness" worse with activity.  Fatigue - longterm h/o energy level down.  H/o OSA but does not use CPAP.  On caffeine (in excedrin) for years 2/2 this chronic fatigue.   Bowels moving well - takes metamucil daily.  Wt Readings from Last 3 Encounters:  02/27/13 246 lb 12 oz (111.925 kg)  05/03/12 236 lb (107.049 kg)  06/04/11 247 lb 8 oz (112.265 kg)    Past Medical History  Diagnosis Date  . Arthritis   . Depression   . Headache(784.0)   . CAD (coronary artery disease)     3 stents  . HTN (hypertension)   . HLD (hyperlipidemia)   . Migraines   . Neuropathy   . Post-polio syndrome   . Shoulder pain, right   . OSA (obstructive sleep apnea)     does not want to use CPAP, using mouth guard  . Chronic pain syndrome     [facility]    Past Surgical History  Procedure Laterality Date  . Coronary angioplasty with stent placement  1995, 2000, 2005  . Uvulopalatopharyngoplasty, tonsillectomy and  septoplasty  2002   Review of Systems per HPI    Objective:   Physical Exam  Nursing note and vitals reviewed. Constitutional: He appears well-developed and well-nourished. No distress.  HENT:  Head: Normocephalic and atraumatic.  Mouth/Throat: Oropharynx is clear and moist. No oropharyngeal exudate.  Neck: Normal range of motion. Neck supple.  Cardiovascular: Normal rate, regular rhythm, normal heart sounds and intact distal pulses.   No murmur heard. Pulmonary/Chest: Effort normal and breath sounds normal. No respiratory distress. He has no wheezes. He has no rales.  Abdominal: Soft. Normal appearance and bowel sounds are normal. He exhibits no distension and no mass. There is no hepatosplenomegaly. There is no tenderness. There is no rigidity, no rebound, no guarding, no CVA tenderness and negative Murphy's sign.  obese  Musculoskeletal: He exhibits no edema.  Lymphadenopathy:    He has no cervical adenopathy.       Assessment & Plan:

## 2013-02-27 NOTE — Assessment & Plan Note (Addendum)
Check for reversible causes with vit D, B12, CBC and TSH.   Does have h/o untreated OSA (except for use of mouthguard) - discussed this is likely very related to fatigue complaint.  Remains decided against CPAP. Continue to exercise as able at home.

## 2013-02-28 LAB — VITAMIN D 25 HYDROXY (VIT D DEFICIENCY, FRACTURES): Vit D, 25-Hydroxy: 20 ng/mL — ABNORMAL LOW (ref 30–89)

## 2013-02-28 LAB — HEMOGLOBIN A1C: Hgb A1c MFr Bld: 6.1 % (ref 4.6–6.5)

## 2013-02-28 LAB — CBC WITH DIFFERENTIAL/PLATELET
Basophils Absolute: 0.1 10*3/uL (ref 0.0–0.1)
Eosinophils Absolute: 0.3 10*3/uL (ref 0.0–0.7)
MCHC: 34.4 g/dL (ref 30.0–36.0)
MCV: 92.2 fl (ref 78.0–100.0)
Monocytes Absolute: 0.6 10*3/uL (ref 0.1–1.0)
Neutrophils Relative %: 63.5 % (ref 43.0–77.0)
Platelets: 355 10*3/uL (ref 150.0–400.0)
RDW: 13.5 % (ref 11.5–14.6)
WBC: 8.9 10*3/uL (ref 4.5–10.5)

## 2013-02-28 LAB — BASIC METABOLIC PANEL
CO2: 28 mEq/L (ref 19–32)
Calcium: 9.7 mg/dL (ref 8.4–10.5)
Creatinine, Ser: 0.8 mg/dL (ref 0.4–1.5)

## 2013-02-28 NOTE — Assessment & Plan Note (Signed)
For soreness, trial of skelaxin and tylenol (instead of excedrin and ibuprofen).  Sedation precautions discussed.

## 2013-03-09 ENCOUNTER — Other Ambulatory Visit: Payer: Self-pay | Admitting: Family Medicine

## 2013-03-09 MED ORDER — METAXALONE 800 MG PO TABS: 400.0000 mg | ORAL_TABLET | Freq: Three times a day (TID) | ORAL | Status: DC

## 2013-03-09 MED ORDER — OMEPRAZOLE 40 MG PO CPDR: 40.0000 mg | DELAYED_RELEASE_CAPSULE | Freq: Every day | ORAL | Status: DC

## 2013-03-09 NOTE — Telephone Encounter (Signed)
[name] wife came in hoping to get a [facility] and Skelaxin.  Thanks!    Callback number - [phone]

## 2013-03-09 NOTE — Telephone Encounter (Signed)
Refilled and patient's wife notified.

## 2013-03-09 NOTE — Telephone Encounter (Signed)
Ok to refill 90 day supply to [facility]? I've already refilled the Prilosec.

## 2013-03-29 ENCOUNTER — Ambulatory Visit (INDEPENDENT_AMBULATORY_CARE_PROVIDER_SITE_OTHER): Payer: Medicare Other | Admitting: Family Medicine

## 2013-03-29 ENCOUNTER — Encounter: Payer: Self-pay | Admitting: Family Medicine

## 2013-03-29 VITALS — BP 144/96 | HR 88 | Temp 98.2°F | Wt 244.5 lb

## 2013-03-29 DIAGNOSIS — G14 Postpolio syndrome: Secondary | ICD-10-CM

## 2013-03-29 DIAGNOSIS — E559 Vitamin D deficiency, unspecified: Secondary | ICD-10-CM | POA: Diagnosis not present

## 2013-03-29 DIAGNOSIS — L918 Other hypertrophic disorders of the skin: Secondary | ICD-10-CM

## 2013-03-29 DIAGNOSIS — R1013 Epigastric pain: Secondary | ICD-10-CM

## 2013-03-29 DIAGNOSIS — B91 Sequelae of poliomyelitis: Secondary | ICD-10-CM

## 2013-03-29 DIAGNOSIS — L909 Atrophic disorder of skin, unspecified: Secondary | ICD-10-CM | POA: Diagnosis not present

## 2013-03-29 DIAGNOSIS — D235 Other benign neoplasm of skin of trunk: Secondary | ICD-10-CM | POA: Diagnosis not present

## 2013-03-29 MED ORDER — SILDENAFIL CITRATE 100 MG PO TABS: ORAL_TABLET | ORAL | Status: DC

## 2013-03-29 MED ORDER — CHOLECALCIFEROL 50 MCG (2000 UT) PO CAPS: 1.0000 | ORAL_CAPSULE | Freq: Every day | ORAL | Status: DC

## 2013-03-29 NOTE — Assessment & Plan Note (Signed)
Improved significantly with decreased excedrin and NSAID use as well as regular omeprazole. Continue this treatment plan - encouraged avoiding excedrin. If red flags pt to return or notify me (such as dysphagia, early satiety, or unexpected weight loss). Pt agrees with plan.

## 2013-03-29 NOTE — Assessment & Plan Note (Signed)
Discussed starting vit D supplement OTC 2000 IU daily.

## 2013-03-29 NOTE — Addendum Note (Signed)
Addended by: Josph Macho A on: 03/29/2013 09:59 AM   Modules accepted: Orders

## 2013-03-29 NOTE — Patient Instructions (Signed)
Return in 3-4 months fasting for blood work, afterwards for [employer] visit as you're due. Use antibiotic ointment with dressing changes once daily for next 1-2 days then should heal well. Continue omeprazole 40mg  daily.  Keep trying to minimize excedrin.  From mychart: Alexander [name],  Your blood work returned normal - kidneys, sugar, blood count, vitamin B12. Your vitamin D level was low at 20 - I recommend starting vit D supplementation 2000 units daily over the counter.  Your A1c returned at 6.1% - in prediabetes range which increases your risk of developing diabetes.  Fatigue is likely from sleep apnea, but low vitamin D level may be contributing.  You may come in for skin tag removal at your convenience  Let us know if questions,  Dr.G.

## 2013-03-29 NOTE — Progress Notes (Signed)
  Subjective:    Patient ID: Alexander [name], male    DOB: 02/04/1951, 62 y.o.   MRN: [id number]  HPI CC: 1 mo f/u  Seen here last month with concern for dyspepsia without red flags.  Thought related to excessive caffeine/aspirin/nsaid use - discussed slow titration off these meds and transition to skelaxin/tylenol.  Decreased excedrin from 9 /day to 6 /day.  Has also decreased ibuprofen use (from 12/day to 6/day).  Did start omeprazole 40mg  daily.  Has only had one episode of loose bowels (over weekend) but this is dramatic improvement from prior.  Notices that sxs worse with [place] food.  Taking skelaxin.  Not taking extra tylenol.  Also would like skin tag removal today - on right lower abdomen at beltline- irritated when catches on pants.  Present for 20+ years.  Not changing.  HLD - off zocor for the last year.   Lab Results  Component Value Date   CHOL 218* 05/06/2012   HDL 50.80 05/06/2012   LDLDIRECT 137.1 05/06/2012   TRIG 241.0* 05/06/2012   CHOLHDL 4 05/06/2012     Past Medical History  Diagnosis Date  . Arthritis   . Depression   . Headache(784.0)   . CAD (coronary artery disease)     3 stents  . HTN (hypertension)   . HLD (hyperlipidemia)   . Migraines   . Neuropathy   . Post-polio syndrome   . Shoulder pain, right   . OSA (obstructive sleep apnea)     does not want to use CPAP, using mouth guard  . Chronic pain syndrome     [facility]    Review of Systems Per HPI    Objective:   Physical Exam  Nursing note and vitals reviewed. Constitutional: He appears well-developed and well-nourished. No distress.  HENT:  Mouth/Throat: Oropharynx is clear and moist. No oropharyngeal exudate.  Neck: No thyromegaly present.  Cardiovascular: Normal rate, regular rhythm, normal heart sounds and intact distal pulses.   No murmur heard. Pulmonary/Chest: Effort normal and breath sounds normal. No respiratory distress. He has no wheezes. He has no rales.  Abdominal: Soft.  Normal appearance and bowel sounds are normal. He exhibits no distension and no mass. There is no hepatosplenomegaly. There is no tenderness. There is no rigidity, no rebound, no guarding and negative Murphy's sign.  obese  Skin: Skin is warm and dry.  Larger pedunculated tag right lower abdomen. Smaller skin tag left lateral neck       Assessment & Plan:

## 2013-03-29 NOTE — Assessment & Plan Note (Signed)
Removed x 2.  Larger one sent to path.  IC obtained and in chart.  Skin tags are snipped off using alcohol for cleansing and sterile iris scissors. Local anesthesia was used. Silver nitrate used to cauterize oozing.  Pt tolerated procedure well. Aftercare discussed.

## 2013-03-29 NOTE — Assessment & Plan Note (Signed)
Brings disability paperwork which I will review and fill out.

## 2013-05-04 ENCOUNTER — Other Ambulatory Visit: Payer: Self-pay | Admitting: Family Medicine

## 2013-05-09 ENCOUNTER — Other Ambulatory Visit: Payer: Self-pay | Admitting: Family Medicine

## 2013-06-19 ENCOUNTER — Telehealth: Payer: Self-pay | Admitting: *Deleted

## 2013-06-19 NOTE — Telephone Encounter (Signed)
Printed and placed in Kim's box. 

## 2013-06-19 NOTE — Telephone Encounter (Signed)
Needs written script for bilateral leg braces due to polio syndrome. Please fax to [place] Phone # [phone] Fax # [phone]

## 2013-06-20 DIAGNOSIS — H2510 Age-related nuclear cataract, unspecified eye: Secondary | ICD-10-CM | POA: Diagnosis not present

## 2013-06-20 NOTE — Telephone Encounter (Signed)
Script faxed as instructed. Patient notified that script has been faxed to [facility].

## 2013-06-29 ENCOUNTER — Encounter: Payer: Self-pay | Admitting: Family Medicine

## 2013-06-29 ENCOUNTER — Ambulatory Visit (INDEPENDENT_AMBULATORY_CARE_PROVIDER_SITE_OTHER): Payer: Medicare Other | Admitting: Family Medicine

## 2013-06-29 VITALS — BP 140/86 | HR 68 | Temp 97.7°F | Ht 70.0 in | Wt 251.8 lb

## 2013-06-29 DIAGNOSIS — I2510 Atherosclerotic heart disease of native coronary artery without angina pectoris: Secondary | ICD-10-CM | POA: Diagnosis not present

## 2013-06-29 DIAGNOSIS — Z1211 Encounter for screening for malignant neoplasm of colon: Secondary | ICD-10-CM | POA: Diagnosis not present

## 2013-06-29 DIAGNOSIS — Z23 Encounter for immunization: Secondary | ICD-10-CM | POA: Diagnosis not present

## 2013-06-29 DIAGNOSIS — Z0000 Encounter for general adult medical examination without abnormal findings: Secondary | ICD-10-CM | POA: Insufficient documentation

## 2013-06-29 DIAGNOSIS — Z125 Encounter for screening for malignant neoplasm of prostate: Secondary | ICD-10-CM

## 2013-06-29 DIAGNOSIS — I10 Essential (primary) hypertension: Secondary | ICD-10-CM

## 2013-06-29 DIAGNOSIS — Z2911 Encounter for prophylactic immunotherapy for respiratory syncytial virus (RSV): Secondary | ICD-10-CM

## 2013-06-29 DIAGNOSIS — E785 Hyperlipidemia, unspecified: Secondary | ICD-10-CM | POA: Diagnosis not present

## 2013-06-29 DIAGNOSIS — B91 Sequelae of poliomyelitis: Secondary | ICD-10-CM

## 2013-06-29 DIAGNOSIS — G14 Postpolio syndrome: Secondary | ICD-10-CM

## 2013-06-29 LAB — LIPID PANEL
CHOL/HDL RATIO: 5
CHOLESTEROL: 295 mg/dL — AB (ref 0–200)
HDL: 55.5 mg/dL (ref >39.00)
TRIGLYCERIDES: 155 mg/dL — AB (ref 0.0–149.0)
VLDL: 31 mg/dL (ref 0.0–40.0)

## 2013-06-29 LAB — TSH: TSH: 0.82 u[IU]/mL (ref 0.35–5.50)

## 2013-06-29 LAB — PSA, MEDICARE: PSA: 0.9 ng/mL (ref 0.10–4.00)

## 2013-06-29 LAB — LDL CHOLESTEROL, DIRECT: Direct LDL: 213.1 mg/dL

## 2013-06-29 NOTE — Progress Notes (Signed)
Subjective:    Patient ID: Alexander [name], male    DOB: May 14, 1951, 63 y.o.   MRN: [id number]  HPI CC: medicare wellness exam  Here for wellness exam today.  Yesterday had dental work - with general anesthesia - 7 teeth removed.  Passes hearing and vision screens today. + falls - high fall risk due to post polio syndrome.  Uses braces to walk. Last fall was Friday night - late at night and tired, tripped over dogs.  Declines PT for fall prevention.  Rides stationary bike at home. Denies depression/anhedonia, sadness.  Preventative:  Colon cancer screening - states had normal colonoscopy 2004 in [place].  Would like to repeat. Prostate cancer screening - desires continued screening Flu shot 01/2013 Td 2013 zostavax - discussed. Would like today. Advanced directives: to set up at home.  Wife would be HC proxy. Does not want prolonged life support.  Wants to be cremated.  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  Past histories reviewed and updated if relevant as below. Patient Active Problem List   Diagnosis Date Noted  . Unspecified vitamin D deficiency 03/29/2013  . Acrochordon 03/29/2013  . Epigastric discomfort 02/27/2013  . Fatigue 02/27/2013  . Hyperglycemia 05/09/2012  . Male sexual dysfunction 05/03/2012  . Candida rash of groin 03/05/2011  . Post-polio syndrome   . Chronic pain syndrome   . OSA (obstructive sleep apnea)   . Migraines   . HLD (hyperlipidemia)   . HTN (hypertension)   . CAD (coronary artery disease)   . Depression    Past Medical History  Diagnosis Date  . Arthritis   . Depression   . Headache(784.0)   . CAD (coronary artery disease)     3 stents  . HTN  (hypertension)   . HLD (hyperlipidemia)   . Migraines   . Neuropathy   . Post-polio syndrome   . Shoulder pain, right   . OSA (obstructive sleep apnea)     does not want to use CPAP, using mouth guard  . Chronic pain syndrome     [facility]   Past Surgical History  Procedure Laterality Date  . Coronary angioplasty with stent placement  1995, 2000, 2005  . Uvulopalatopharyngoplasty, tonsillectomy and septoplasty  2002  . Lumbar spine stimulator    . Cervical spine stimulator  08/2012   History  Substance Use Topics  . Smoking status: Never Smoker   . Smokeless tobacco: Never Used  . Alcohol Use: No   Family History  Problem Relation Age of Onset  . Diabetes Mother   . Cancer Sister 39    male cancer  . Coronary artery disease Neg Hx   . Stroke Neg Hx    No Known Allergies Current Outpatient Prescriptions on File Prior to Visit  Medication Sig Dispense Refill  . aspirin-acetaminophen-caffeine (EXCEDRIN MIGRAINE) 250-250-65 MG per tablet 12 daily      . Cholecalciferol ([facility] VITAMIN D) 2000 UNITS CAPS Take 1 capsule (2,000 Units total) by mouth daily.      Marland Kitchen gabapentin (NEURONTIN) 600 MG tablet TAKE 2 TABLETS THREE TIMES A DAY  540 tablet  2  . ibuprofen (ADVIL,MOTRIN) 200 MG tablet Take 200 mg by mouth every 6 (six)  hours as needed.      Marland Kitchen lisinopril (PRINIVIL,ZESTRIL) 10 MG tablet TAKE 1 TABLET DAILY  90 tablet  2  . metaxalone (SKELAXIN) 800 MG tablet TAKE ONE-HALF TO ONE TABLET (400 TO 800 MG TOTAL) THREE TIMES A DAY  250 tablet  0  . omeprazole (PRILOSEC) 40 MG capsule Take 1 capsule (40 mg total) by mouth daily.  90 capsule  3  . psyllium (METAMUCIL) 58.6 % powder Take 1 packet by mouth 2 (two) times daily.        . sildenafil (VIAGRA) 100 MG tablet TAKE 1 TABLET DAILY AS NEEDED  10 tablet  2  . simvastatin (ZOCOR) 40 MG tablet Take 1 tablet (40 mg total) by mouth at bedtime.  90 tablet  3   No current facility-administered medications on file prior to visit.      Review of Systems  Constitutional: Negative for fever, chills, activity change, appetite change, fatigue and unexpected weight change.  HENT: Negative for hearing loss.   Eyes: Negative for visual disturbance.  Respiratory: Positive for cough (attributes to PNDrainage). Negative for chest tightness, shortness of breath and wheezing.   Cardiovascular: Negative for chest pain, palpitations and leg swelling.  Gastrointestinal: Negative for nausea, vomiting, abdominal pain, diarrhea, constipation, blood in stool and abdominal distention.  Genitourinary: Negative for hematuria and difficulty urinating.  Musculoskeletal: Negative for arthralgias, myalgias and neck pain.  Skin: Negative for rash.  Neurological: Negative for dizziness, seizures, syncope and headaches.  Hematological: Negative for adenopathy. Does not bruise/bleed easily.  Psychiatric/Behavioral: Negative for dysphoric mood. The patient is not nervous/anxious.        Objective:   Physical Exam  Nursing note and vitals reviewed. Constitutional: He is oriented to person, place, and time. He appears well-developed and well-nourished. No distress.  HENT:  Head: Normocephalic and atraumatic.  Right Ear: Hearing, tympanic membrane, external ear and ear canal normal.  Left Ear: Hearing, tympanic membrane, external ear and ear canal normal.  Nose: Nose normal.  Mouth/Throat: Oropharynx is clear and moist. No oropharyngeal exudate.  Eyes: Conjunctivae and EOM are normal. Pupils are equal, round, and reactive to light. No scleral icterus.  Neck: Normal range of motion. Neck supple. Carotid bruit is not present.  Cardiovascular: Normal rate, regular rhythm, normal heart sounds and intact distal pulses.   No murmur heard. Pulses:      Radial pulses are 2+ on the right side, and 2+ on the left side.  Pulmonary/Chest: Effort normal and breath sounds normal. No respiratory distress. He has no wheezes. He has no rales.  Abdominal: Soft.  Bowel sounds are normal. He exhibits no distension and no mass. There is no tenderness. There is no rebound and no guarding.  Genitourinary: Rectum normal and prostate normal. Rectal exam shows no external hemorrhoid, no internal hemorrhoid, no fissure, no mass, no tenderness and anal tone normal. Prostate is not enlarged (15gm) and not tender.  Musculoskeletal: Normal range of motion. He exhibits no edema.  Lymphadenopathy:    He has no cervical adenopathy.  Neurological: He is alert and oriented to person, place, and time.  CN grossly intact, station and gait intact  Skin: Skin is warm and dry. No rash noted.  Psychiatric: He has a normal mood and affect. His behavior is normal. Judgment and thought content normal.       Assessment & Plan:

## 2013-06-29 NOTE — Addendum Note (Signed)
Addended by: Royann Shivers A on: 06/29/2013 12:28 PM   Modules accepted: Orders

## 2013-06-29 NOTE — Assessment & Plan Note (Signed)
Chronic, mildly elevated but overall stable. Continue lisinopril.

## 2013-06-29 NOTE — Assessment & Plan Note (Signed)
Has not been taking simvastatin - recommended restart.  Check FLP and may recommend increased potency statin as last LDL was too high on simvastatin.

## 2013-06-29 NOTE — Assessment & Plan Note (Addendum)
Overall asxs.  Will obtain baseline EKG today. EKG - NSR rage 75, normal axis, intervals, no acute ST/T changes, good R wave progression, one PAC.

## 2013-06-29 NOTE — Progress Notes (Signed)
Pre-visit discussion using our clinic review tool. No additional management support is needed unless otherwise documented below in the visit note.  

## 2013-06-29 NOTE — Assessment & Plan Note (Signed)
I have personally reviewed the Medicare Annual Wellness questionnaire and have noted 1. [name] medical and social history 2. Their use of alcohol, tobacco or illicit drugs 3. Their current medications and supplements 4. [name] functional ability including ADL's, fall risks, home safety risks and hearing or visual impairment. 5. Diet and physical activity 6. Evidence for depression or mood disorders The patients weight, height, BMI have been recorded in the chart.  Hearing and vision has been addressed. I have made referrals, counseling and provided education to the patient based review of the above and I have provided the pt with a written personalized care plan for preventive services. See scanned questionairre. Advanced directives discussed: pt will set up, would want wife to be [facility] proxy.  Reviewed preventative protocols and updated unless pt declined. DRE reassuring today.  Will order PSA today and schedule colonoscopy. Zostavax per pt request today - aware unsure what copay will be

## 2013-06-29 NOTE — Addendum Note (Signed)
Addended by: Royann Shivers A on: 06/29/2013 01:10 PM   Modules accepted: Orders

## 2013-06-29 NOTE — Assessment & Plan Note (Signed)
Pending approval for new fitted braces. Declines PT.

## 2013-06-29 NOTE — Patient Instructions (Addendum)
Zostavax today (shingles shot). Pass by Marion's office to schedule colonoscopy. Blood work today. Baseline EKG today. Good to see you today, return as needed or in 4-6 months for follow up.

## 2013-06-30 ENCOUNTER — Telehealth: Payer: Self-pay | Admitting: Family Medicine

## 2013-06-30 NOTE — Telephone Encounter (Signed)
Relevant patient education assigned to patient using Emmi. ° °

## 2013-07-02 ENCOUNTER — Other Ambulatory Visit: Payer: Self-pay | Admitting: Family Medicine

## 2013-07-02 MED ORDER — ATORVASTATIN CALCIUM 40 MG PO TABS: 40.0000 mg | ORAL_TABLET | Freq: Every day | ORAL | Status: DC

## 2013-07-04 ENCOUNTER — Encounter: Payer: Self-pay | Admitting: Internal Medicine

## 2013-07-06 ENCOUNTER — Telehealth: Payer: Self-pay | Admitting: *Deleted

## 2013-07-06 NOTE — Telephone Encounter (Signed)
Filled and placed in my out box. 

## 2013-07-06 NOTE — Telephone Encounter (Signed)
Form for medical necessity from [facility] in your IN box for completion.

## 2013-07-21 ENCOUNTER — Encounter: Payer: Self-pay | Admitting: Family Medicine

## 2013-07-21 ENCOUNTER — Telehealth: Payer: Self-pay | Admitting: *Deleted

## 2013-07-21 ENCOUNTER — Ambulatory Visit (INDEPENDENT_AMBULATORY_CARE_PROVIDER_SITE_OTHER): Payer: Medicare Other | Admitting: Family Medicine

## 2013-07-21 VITALS — BP 136/82 | HR 96 | Temp 97.9°F | Wt 234.5 lb

## 2013-07-21 DIAGNOSIS — J069 Acute upper respiratory infection, unspecified: Secondary | ICD-10-CM

## 2013-07-21 DIAGNOSIS — I2510 Atherosclerotic heart disease of native coronary artery without angina pectoris: Secondary | ICD-10-CM | POA: Diagnosis not present

## 2013-07-21 DIAGNOSIS — B9789 Other viral agents as the cause of diseases classified elsewhere: Principal | ICD-10-CM

## 2013-07-21 MED ORDER — GUAIFENESIN-CODEINE 100-10 MG/5ML PO SYRP: 5.0000 mL | ORAL_SOLUTION | Freq: Two times a day (BID) | ORAL | Status: DC | PRN

## 2013-07-21 NOTE — Patient Instructions (Addendum)
Sounds like you have a viral upper respiratory infection with pharyngitis. Antibiotics are not needed for this.  Viral infections usually take 7-10 days to resolve.  The cough can last a few weeks to go away. May use ibuprofen 400-600mg  for throat inflammation.  Salt water gargles. Suck on cold things like popsicles or warm things like herbal teas (whichever soothes the throat better). Use medication as prescribed: cough med for sleep Push fluids and plenty of rest.

## 2013-07-21 NOTE — Telephone Encounter (Signed)
Patient's wife called and said he now has the exact same symptoms that she came to see you for the other day (congestion,cough,low grade temp). She was asking if you would send in abx for him along with the cough med because she can't get him in today. She said her meds have worked great.

## 2013-07-21 NOTE — Telephone Encounter (Signed)
Appt scheduled. He has his brace fitting at 1PM today (not sure how long it will take) and said that if he is running late to the appt here, they will call and cancel and go to Saturday clinic tomorrow.

## 2013-07-21 NOTE — Telephone Encounter (Addendum)
Pt needs eval in office or at [facility].

## 2013-07-21 NOTE — Progress Notes (Signed)
Pre visit review using our clinic review tool, if applicable. No additional management support is needed unless otherwise documented below in the visit note. 

## 2013-07-21 NOTE — Progress Notes (Signed)
BP 136/82  Pulse 96  Temp(Src) 97.9 F (36.6 C) (Oral)  Wt 234 lb 8 oz (106.369 kg)   CC: URI?  Subjective:    Patient ID: Alexander [name], male    DOB: 01/11/1951, 63 y.o.   MRN: [id number]  HPI: Alexander [name] is a 63 y.o. male presenting on 07/21/2013 with URI   5d h/o low grade fever, sinus congestion, drainage, cough, sore throat.  + headache.  In bed all day yesterday.  Fatigued, body aches.  Seems to be deteriorating each night.  Cough becoming more productive.  Cough keeping him up at night time.  No ear pain, abd pain, new rashes.  So far has tried OTC mucinex, cough syrup, nyquil, throat lozenges. + sick contacts at home. No h/o asthma. No smokers at home.  Relevant past medical, surgical, family and social history reviewed and updated. Allergies and medications reviewed and updated. Current Outpatient Prescriptions on File Prior to Visit  Medication Sig  . aspirin-acetaminophen-caffeine (EXCEDRIN MIGRAINE) 250-250-65 MG per tablet 12 daily  . atorvastatin (LIPITOR) 40 MG tablet Take 1 tablet (40 mg total) by mouth daily at 6 PM.  . Cholecalciferol ([facility] VITAMIN D) 2000 UNITS CAPS Take 1 capsule (2,000 Units total) by mouth daily.  Marland Kitchen gabapentin (NEURONTIN) 600 MG tablet TAKE 2 TABLETS THREE TIMES A DAY  . ibuprofen (ADVIL,MOTRIN) 200 MG tablet Take 200 mg by mouth every 6 (six) hours as needed.  Marland Kitchen lisinopril (PRINIVIL,ZESTRIL) 10 MG tablet TAKE 1 TABLET DAILY  . metaxalone (SKELAXIN) 800 MG tablet TAKE ONE-HALF TO ONE TABLET (400 TO 800 MG TOTAL) THREE TIMES A DAY  . omeprazole (PRILOSEC) 40 MG capsule Take 1 capsule (40 mg total) by mouth daily.  . psyllium (METAMUCIL) 58.6 % powder Take 1 packet by mouth 2 (two) times daily.    . sildenafil (VIAGRA) 100 MG tablet TAKE 1 TABLET DAILY AS NEEDED   No current facility-administered medications on file prior to visit.    Review of Systems Per HPI unless specifically indicated above    Objective:    BP 136/82  Pulse  96  Temp(Src) 97.9 F (36.6 C) (Oral)  Wt 234 lb 8 oz (106.369 kg)  Physical Exam  Nursing note and vitals reviewed. Constitutional: He appears well-developed and well-nourished. No distress.  HENT:  Head: Normocephalic and atraumatic.  Right Ear: Hearing, tympanic membrane, external ear and ear canal normal.  Left Ear: Hearing, tympanic membrane, external ear and ear canal normal.  Nose: Mucosal edema present. No rhinorrhea. Right sinus exhibits no maxillary sinus tenderness and no frontal sinus tenderness. Left sinus exhibits no maxillary sinus tenderness and no frontal sinus tenderness.  Mouth/Throat: Uvula is midline and mucous membranes are normal. Posterior oropharyngeal edema and posterior oropharyngeal erythema present. No oropharyngeal exudate or tonsillar abscesses.  Eyes: Conjunctivae and EOM are normal. Pupils are equal, round, and reactive to light. No scleral icterus.  Neck: Normal range of motion. Neck supple.  Cardiovascular: Normal rate, regular rhythm, normal heart sounds and intact distal pulses.   No murmur heard. Pulmonary/Chest: Effort normal and breath sounds normal. No respiratory distress. He has no wheezes. He has no rales.  Lymphadenopathy:    He has no cervical adenopathy.  Skin: Skin is warm and dry. No rash noted.       Assessment & Plan:   Problem List Items Addressed This Visit   Viral URI with cough - Primary     Given short duration anticipate viral process. No evidence of  bacterial infection today. Will treat with supportive care and lots of fluids and cough syrup for night time. Update if sxs persist or worsen for abx course.        Follow up plan: Return if symptoms worsen or fail to improve.

## 2013-07-21 NOTE — Assessment & Plan Note (Signed)
Given short duration anticipate viral process. No evidence of bacterial infection today. Will treat with supportive care and lots of fluids and cough syrup for night time. Update if sxs persist or worsen for abx course.

## 2013-08-10 ENCOUNTER — Ambulatory Visit (AMBULATORY_SURGERY_CENTER): Payer: Self-pay

## 2013-08-10 VITALS — Ht 71.0 in | Wt 232.0 lb

## 2013-08-10 DIAGNOSIS — Z1211 Encounter for screening for malignant neoplasm of colon: Secondary | ICD-10-CM

## 2013-08-10 MED ORDER — MOVIPREP 100 G PO SOLR: 1.0000 | Freq: Once | ORAL | Status: DC

## 2013-08-17 ENCOUNTER — Telehealth: Payer: Self-pay | Admitting: *Deleted

## 2013-08-17 NOTE — Telephone Encounter (Signed)
Patient's wife called and said he is still not any better from his previous visit here. She said you told him to call back if no better for abx and he would like to go ahead with those now. [facility].

## 2013-08-17 NOTE — Telephone Encounter (Signed)
What are sxs? What is worse - productive cough and chest congestion or head congestion/headache?  Any fever?

## 2013-08-18 MED ORDER — AMOXICILLIN-POT CLAVULANATE 875-125 MG PO TABS: 1.0000 | ORAL_TABLET | Freq: Two times a day (BID) | ORAL | Status: DC

## 2013-08-18 NOTE — Telephone Encounter (Signed)
He has a productive cough, sinus congestion and a ST from the drainage.

## 2013-08-18 NOTE — Telephone Encounter (Signed)
Sent in augmentin course and notified wife.

## 2013-08-23 ENCOUNTER — Telehealth: Payer: Self-pay | Admitting: Internal Medicine

## 2013-08-23 NOTE — Telephone Encounter (Signed)
Spoke with pt. Spouse.  States he has been vomiting and having diarrhea all day.  He isn't able to keep fluids down.  They wish to cancel appointment for tomorrow Even though they have mixed the prep.  Pt. Spouse will call third floor to reschedule.

## 2013-08-24 ENCOUNTER — Encounter: Payer: Medicare Other | Admitting: Internal Medicine

## 2013-08-24 NOTE — Telephone Encounter (Signed)
No charge. 

## 2013-09-20 ENCOUNTER — Other Ambulatory Visit: Payer: Self-pay | Admitting: Family Medicine

## 2013-09-20 NOTE — Telephone Encounter (Signed)
Ok to refill?  Not on current med list. Has taken in the past, but was prescribed Skelaxin on 05/09/13.

## 2013-09-21 NOTE — Telephone Encounter (Signed)
Ok to refill in Dr. Synthia Innocent absence? Last filled 05/09/13.

## 2013-09-21 NOTE — Telephone Encounter (Signed)
Sent!

## 2013-09-21 NOTE — Telephone Encounter (Signed)
I would defer this to Dr. Darnell Level.

## 2013-09-22 NOTE — Telephone Encounter (Signed)
plz check with patient which muscle relaxant works better for him.

## 2013-09-27 NOTE — Telephone Encounter (Signed)
The Skelaxin works better. This was an error.

## 2013-09-29 HISTORY — PX: COLONOSCOPY: SHX174

## 2013-10-16 ENCOUNTER — Telehealth: Payer: Self-pay | Admitting: Internal Medicine

## 2013-10-16 DIAGNOSIS — Z1211 Encounter for screening for malignant neoplasm of colon: Secondary | ICD-10-CM

## 2013-10-16 MED ORDER — MOVIPREP 100 G PO SOLR: 1.0000 | Freq: Once | ORAL | Status: DC

## 2013-10-19 ENCOUNTER — Encounter: Payer: Self-pay | Admitting: Internal Medicine

## 2013-10-19 ENCOUNTER — Ambulatory Visit (AMBULATORY_SURGERY_CENTER): Payer: Medicare Other | Admitting: Internal Medicine

## 2013-10-19 VITALS — BP 120/58 | HR 52 | Temp 97.5°F | Resp 17 | Ht 71.0 in | Wt 232.0 lb

## 2013-10-19 DIAGNOSIS — D126 Benign neoplasm of colon, unspecified: Secondary | ICD-10-CM | POA: Diagnosis not present

## 2013-10-19 DIAGNOSIS — I2510 Atherosclerotic heart disease of native coronary artery without angina pectoris: Secondary | ICD-10-CM | POA: Diagnosis not present

## 2013-10-19 DIAGNOSIS — F3289 Other specified depressive episodes: Secondary | ICD-10-CM | POA: Diagnosis not present

## 2013-10-19 DIAGNOSIS — F329 Major depressive disorder, single episode, unspecified: Secondary | ICD-10-CM | POA: Diagnosis not present

## 2013-10-19 DIAGNOSIS — D128 Benign neoplasm of rectum: Secondary | ICD-10-CM | POA: Diagnosis not present

## 2013-10-19 DIAGNOSIS — Z1211 Encounter for screening for malignant neoplasm of colon: Secondary | ICD-10-CM

## 2013-10-19 DIAGNOSIS — Z860100 Personal history of colon polyps, unspecified: Secondary | ICD-10-CM | POA: Diagnosis present

## 2013-10-19 DIAGNOSIS — D129 Benign neoplasm of anus and anal canal: Secondary | ICD-10-CM

## 2013-10-19 DIAGNOSIS — K648 Other hemorrhoids: Secondary | ICD-10-CM | POA: Diagnosis not present

## 2013-10-19 DIAGNOSIS — I10 Essential (primary) hypertension: Secondary | ICD-10-CM | POA: Diagnosis not present

## 2013-10-19 MED ORDER — SODIUM CHLORIDE 0.9 % IV SOLN: 500.0000 mL | INTRAVENOUS | Status: DC

## 2013-10-19 NOTE — Patient Instructions (Signed)
YOU HAD AN ENDOSCOPIC PROCEDURE TODAY AT THE [facility]: Refer to the procedure report that was given to you for any specific questions about what was found during the examination.  If the procedure report does not answer your questions, please call your gastroenterologist to clarify.  If you requested that your care partner not be given the details of your procedure findings, then the procedure report has been included in a sealed envelope for you to review at your convenience later.  YOU SHOULD EXPECT: Some feelings of bloating in the abdomen. Passage of more gas than usual.  Walking can help get rid of the air that was put into your GI tract during the procedure and reduce the bloating. If you had a lower endoscopy (such as a colonoscopy or flexible sigmoidoscopy) you may notice spotting of blood in your stool or on the toilet paper. If you underwent a bowel prep for your procedure, then you may not have a normal bowel movement for a few days.  DIET: Your first meal following the procedure should be a light meal and then it is ok to progress to your normal diet.  A half-sandwich or bowl of soup is an example of a good first meal.  Heavy or fried foods are harder to digest and may make you feel nauseous or bloated.  Likewise meals heavy in dairy and vegetables can cause extra gas to form and this can also increase the bloating.  Drink plenty of fluids but you should avoid alcoholic beverages for 24 hours.  ACTIVITY: Your care partner should take you home directly after the procedure.  You should plan to take it easy, moving slowly for the rest of the day.  You can resume normal activity the day after the procedure however you should NOT DRIVE or use heavy machinery for 24 hours (because of the sedation medicines used during the test).    SYMPTOMS TO REPORT IMMEDIATELY: A gastroenterologist can be reached at any hour.  During normal business hours, 8:30 AM to 5:00 PM Monday through Friday,  call [phone].  After hours and on weekends, please call the GI answering service at [phone] who will take a message and have the physician on call contact you.   Following lower endoscopy (colonoscopy or flexible sigmoidoscopy):  Excessive amounts of blood in the stool  Significant tenderness or worsening of abdominal pains  Swelling of the abdomen that is new, acute  Fever of 100F or higher    FOLLOW UP: If any biopsies were taken you will be contacted by phone or by letter within the next 1-3 weeks.  Call your gastroenterologist if you have not heard about the biopsies in 3 weeks.  Our staff will call the home number listed on your records the next business day following your procedure to check on you and address any questions or concerns that you may have at that time regarding the information given to you following your procedure. This is a courtesy call and so if there is no answer at the home number and we have not heard from you through the emergency physician on call, we will assume that you have returned to your regular daily activities without incident.  SIGNATURES/CONFIDENTIALITY: You and/or your care partner have signed paperwork which will be entered into your electronic medical record.  These signatures attest to the fact that that the information above on your After Visit Summary has been reviewed and is understood.  Full responsibility of the confidentiality   of this discharge information lies with you and/or your care-partner.     

## 2013-10-19 NOTE — Progress Notes (Signed)
Called to room to assist during endoscopic procedure.  Patient ID and intended procedure confirmed with present staff. Received instructions for my participation in the procedure from the performing physician.  

## 2013-10-19 NOTE — Op Note (Signed)
[place]  [address]. [place], [address]   COLONOSCOPY PROCEDURE REPORT  PATIENT: Alexander [name], Alexander [name]  MR#: [id number] BIRTHDATE: 1950-08-28 , 39  yrs. old GENDER: Male ENDOSCOPIST: Jerene Bears, MD REFERRED LY:[id number] [name], M.D. PROCEDURE DATE:  10/19/2013 PROCEDURE:   Colonoscopy with snare polypectomy and Colonoscopy with cold biopsy polypectomy First Screening Colonoscopy - Avg.  risk and is 50 yrs.  old or older - No.  Prior Negative Screening - Now for repeat screening. N/A  History of Adenoma - Now for follow-up colonoscopy & has been > or = to 3 yrs.  Yes hx of adenoma.  Has been 3 or more years since last colonoscopy.  Polyps Removed Today? Yes. ASA CLASS:   Class III INDICATIONS:elevated risk screening, Patient's personal history of colon polyps, and Last colonoscopy performed 5-7 years ago [facility], ). MEDICATIONS: MAC sedation, administered by CRNA and propofol (Diprivan) 350mg  IV  DESCRIPTION OF PROCEDURE:   After the risks benefits and alternatives of the procedure were thoroughly explained, informed consent was obtained.  A digital rectal exam revealed no rectal mass.   The LB [id number]  endoscope was introduced through the anus and advanced to the cecum, which was identified by both the appendix and ileocecal valve. No adverse events experienced. The quality of the prep was Moviprep fair  The instrument was then slowly withdrawn as the colon was fully examined.   COLON FINDINGS: Two sessile polyps measuring 5-7 mm in size were found at the hepatic flexure.  Polypectomy was performed using cold snare.  All resections were complete and all polyp tissue was completely retrieved.   A sessile polyp measuring 3-4 mm in size was found in the transverse colon.  A polypectomy was performed with cold forceps.  The resection was complete and the polyp tissue was completely retrieved.  Retroflexed views revealed internal hemorrhoids. The  time to cecum=2 minutes 23 seconds.  Withdrawal time=12 minutes 11 seconds.  The scope was withdrawn and the procedure completed. COMPLICATIONS: There were no complications.  ENDOSCOPIC IMPRESSION: 1.   Two sessile polyps measuring 5-7 mm in size were found at the hepatic flexure; Polypectomy was performed using cold snare 2.   Sessile polyp measuring 3-4 mm in size was found in the transverse colon; polypectomy was performed with cold forceps  RECOMMENDATIONS: 1.  Await pathology results 2.  Timing of repeat colonoscopy will be determined by pathology findings. 3.  You will receive a letter within 1-2 weeks with the results of your biopsy as well as final recommendations.  Please call my office if you have not received a letter after 3 weeks.   eSigned:  Jerene Bears, MD 10/19/2013 11:51 AM      cc: The Patient and [name] MD

## 2013-10-20 ENCOUNTER — Telehealth: Payer: Self-pay | Admitting: *Deleted

## 2013-10-20 NOTE — Telephone Encounter (Signed)
No answer, message left for the patient. 

## 2013-10-30 ENCOUNTER — Encounter: Payer: Self-pay | Admitting: Internal Medicine

## 2013-10-31 ENCOUNTER — Encounter: Payer: Self-pay | Admitting: Family Medicine

## 2013-12-27 ENCOUNTER — Ambulatory Visit: Payer: Medicare Other | Admitting: Family Medicine

## 2014-01-01 ENCOUNTER — Other Ambulatory Visit: Payer: Self-pay | Admitting: Family Medicine

## 2014-01-07 ENCOUNTER — Other Ambulatory Visit: Payer: Self-pay | Admitting: Family Medicine

## 2014-01-08 NOTE — Telephone Encounter (Signed)
Electronic refill request, please advise  

## 2014-01-09 ENCOUNTER — Encounter: Payer: Self-pay | Admitting: Family Medicine

## 2014-01-09 ENCOUNTER — Ambulatory Visit (INDEPENDENT_AMBULATORY_CARE_PROVIDER_SITE_OTHER): Payer: Medicare Other | Admitting: Family Medicine

## 2014-01-09 VITALS — BP 132/88 | HR 88 | Temp 98.1°F | Wt 227.5 lb

## 2014-01-09 DIAGNOSIS — N539 Unspecified male sexual dysfunction: Secondary | ICD-10-CM

## 2014-01-09 DIAGNOSIS — I2510 Atherosclerotic heart disease of native coronary artery without angina pectoris: Secondary | ICD-10-CM | POA: Diagnosis not present

## 2014-01-09 DIAGNOSIS — I10 Essential (primary) hypertension: Secondary | ICD-10-CM | POA: Diagnosis not present

## 2014-01-09 DIAGNOSIS — R7309 Other abnormal glucose: Secondary | ICD-10-CM

## 2014-01-09 DIAGNOSIS — F529 Unspecified sexual dysfunction not due to a substance or known physiological condition: Secondary | ICD-10-CM

## 2014-01-09 DIAGNOSIS — E785 Hyperlipidemia, unspecified: Secondary | ICD-10-CM | POA: Diagnosis not present

## 2014-01-09 DIAGNOSIS — R7303 Prediabetes: Secondary | ICD-10-CM

## 2014-01-09 LAB — BASIC METABOLIC PANEL
BUN: 19 mg/dL (ref 6–23)
CHLORIDE: 106 meq/L (ref 96–112)
CO2: 21 mEq/L (ref 19–32)
Calcium: 9.7 mg/dL (ref 8.4–10.5)
Creatinine, Ser: 0.7 mg/dL (ref 0.4–1.5)
GFR: 113.23 mL/min (ref >60.00)
GLUCOSE: 98 mg/dL (ref 70–99)
Potassium: 4 mEq/L (ref 3.5–5.1)
SODIUM: 139 meq/L (ref 135–145)

## 2014-01-09 LAB — LIPID PANEL
Cholesterol: 205 mg/dL — ABNORMAL HIGH (ref 0–200)
HDL: 49.1 mg/dL (ref >39.00)
LDL Cholesterol: 127 mg/dL — ABNORMAL HIGH (ref 0–99)
NonHDL: 155.9
Total CHOL/HDL Ratio: 4
Triglycerides: 147 mg/dL (ref 0.0–149.0)
VLDL: 29.4 mg/dL (ref 0.0–40.0)

## 2014-01-09 LAB — HEMOGLOBIN A1C: Hgb A1c MFr Bld: 6.2 % (ref 4.6–6.5)

## 2014-01-09 MED ORDER — TADALAFIL 20 MG PO TABS: 10.0000 mg | ORAL_TABLET | ORAL | Status: DC | PRN

## 2014-01-09 MED ORDER — TADALAFIL 20 MG PO TABS: 20.0000 mg | ORAL_TABLET | ORAL | Status: DC | PRN

## 2014-01-09 NOTE — Progress Notes (Signed)
Pre visit review using our clinic review tool, if applicable. No additional management support is needed unless otherwise documented below in the visit note. 

## 2014-01-09 NOTE — Patient Instructions (Signed)
Blood work today to recheck cholesterol on new lipitor . Try cialis instead of viagra. Good to see you today, return in 6 months for next wellness visit.

## 2014-01-09 NOTE — Assessment & Plan Note (Signed)
Chronic, stable. Continue lisinopril. No cough.

## 2014-01-09 NOTE — Assessment & Plan Note (Signed)
Check A1c today.

## 2014-01-09 NOTE — Assessment & Plan Note (Signed)
Not happy with viagra so will do trial of cialis 10-20mg  daily prn ED. Provided with Rx and coupon for 3 free pills.

## 2014-01-09 NOTE — Assessment & Plan Note (Signed)
Check FLP today given recent switch from simvastatin to lipitor 40mg  daily.

## 2014-01-09 NOTE — Progress Notes (Signed)
BP 132/88  Pulse 88  Temp(Src) 98.1 F (36.7 C) (Oral)  Wt 227 lb 8 oz (103.193 kg)   CC: 6 mo f/u visit  Subjective:    Patient ID: Mansur Patti, male    DOB: [birth year], 63 y.o.   MRN: [id number]  HPI: Sidi Dzikowski is a 63 y.o. male presenting on 01/09/2014 for Follow-up   Lots of traveling recently. Just returned from [facility] then [place] then [place] - mother passed away. All driving. Had trouble with some malaise on this trip. Wt Readings from Last 3 Encounters:  01/09/14 227 lb 8 oz (103.193 kg)  10/19/13 232 lb (105.235 kg)  08/10/13 232 lb (105.235 kg)  Body mass index is 31.74 kg/(m^2).  HLD - compliant with lipitor without myalgias.  HTN - compliant with lisinopril 10mg  daily. No HA, vision changes, CP/tightness, SOB, leg swelling.   GERD - controlled with omeprazole 40mg  daily.  ED - on viagra 100mg , not satisfied with this med. Would like trial of cialis.  Stopping narcotics improved headaches.   Relevant past medical, surgical, family and social history reviewed and updated as indicated.  Allergies and medications reviewed and updated. Current Outpatient Prescriptions on File Prior to Visit  Medication Sig  . aspirin-acetaminophen-caffeine (EXCEDRIN MIGRAINE) 250-250-65 MG per tablet 12 daily  . atorvastatin (LIPITOR) 40 MG tablet Take 1 tablet (40 mg total) by mouth daily at 6 PM.  . gabapentin (NEURONTIN) 600 MG tablet TAKE 2 TABLETS THREE TIMES A DAY  . ibuprofen (ADVIL,MOTRIN) 200 MG tablet Take 200 mg by mouth every 6 (six) hours as needed.  Marland Kitchen lisinopril (PRINIVIL,ZESTRIL) 10 MG tablet TAKE 1 TABLET DAILY  . metaxalone (SKELAXIN) 800 MG tablet TAKE ONE-HALF (1/2) TABLET TO ONE TABLET (400 MG TO 800 MG) THREE TIMES A DAY  . omeprazole (PRILOSEC) 40 MG capsule Take 1 capsule (40 mg total) by mouth daily.  . psyllium (METAMUCIL) 58.6 % powder Take 1 packet by mouth 2 (two) times daily.    . Cholecalciferol (CVS VITAMIN D) 2000 UNITS CAPS Take 1 capsule (2,000 Units  total) by mouth daily.   No current facility-administered medications on file prior to visit.    Review of Systems Per HPI unless specifically indicated above    Objective:    BP 132/88  Pulse 88  Temp(Src) 98.1 F (36.7 C) (Oral)  Wt 227 lb 8 oz (103.193 kg)  Physical Exam  Nursing note and vitals reviewed. Constitutional: He appears well-developed and well-nourished. No distress.  HENT:  Mouth/Throat: Oropharynx is clear and moist. No oropharyngeal exudate.  Cardiovascular: Normal rate, regular rhythm, normal heart sounds and intact distal pulses.   No murmur heard. Pulmonary/Chest: Effort normal and breath sounds normal. No respiratory distress. He has no wheezes. He has no rales.  Musculoskeletal: He exhibits no edema.  Braces in place   Results for orders placed in visit on 06/29/13  LIPID PANEL      Result Value Ref Range   Cholesterol 295 (*) 0 - 200 mg/dL   Triglycerides 155.0 (*) 0.0 - 149.0 mg/dL   HDL 55.50  >39.00 mg/dL   VLDL 31.0  0.0 - 40.0 mg/dL   Total CHOL/HDL Ratio 5    PSA, MEDICARE      Result Value Ref Range   PSA 0.90  0.10 - 4.00 ng/ml  TSH      Result Value Ref Range   TSH 0.82  0.35 - 5.50 uIU/mL  LDL CHOLESTEROL, DIRECT      Result  Value Ref Range   Direct LDL 213.1        Assessment & Plan:   Problem List Items Addressed This Visit   Prediabetes     Check A1c today.    Relevant Orders      Hemoglobin A1c      Basic metabolic panel   Male sexual dysfunction     Not happy with viagra so will do trial of cialis 10-20mg  daily prn ED. Provided with Rx and coupon for 3 free pills.    HTN (hypertension)     Chronic, stable. Continue lisinopril. No cough.    Relevant Medications      tadalafil (CIALIS) tablet      tadalafil (CIALIS) tablet   Other Relevant Orders      Basic metabolic panel   HLD (hyperlipidemia) - Primary     Check FLP today given recent switch from simvastatin to lipitor 40mg  daily.     Relevant Medications       tadalafil (CIALIS) tablet      tadalafil (CIALIS) tablet   Other Relevant Orders      Lipid panel       Follow up plan: Return in about 6 months (around 07/12/2014), or if symptoms worsen or fail to improve, for annual exam, prior fasting for blood work.

## 2014-03-03 ENCOUNTER — Other Ambulatory Visit: Payer: Self-pay | Admitting: Family Medicine

## 2014-04-05 ENCOUNTER — Telehealth: Payer: Self-pay | Admitting: Family Medicine

## 2014-04-05 NOTE — Telephone Encounter (Signed)
Forms placed in kim's inbasket.

## 2014-04-05 NOTE — Telephone Encounter (Signed)
Pts wife dropped off disability form to be filled out. Will put inbox-AS

## 2014-04-09 NOTE — Telephone Encounter (Signed)
In your IN box for completion.  

## 2014-04-15 NOTE — Telephone Encounter (Signed)
Filled and placed in Kim's box. 

## 2014-04-16 NOTE — Telephone Encounter (Signed)
Paperwork faxed °

## 2014-06-12 ENCOUNTER — Other Ambulatory Visit: Payer: Self-pay | Admitting: Family Medicine

## 2014-06-12 ENCOUNTER — Encounter: Payer: Self-pay | Admitting: Family Medicine

## 2014-06-12 ENCOUNTER — Other Ambulatory Visit: Payer: Self-pay | Admitting: *Deleted

## 2014-06-12 ENCOUNTER — Ambulatory Visit (INDEPENDENT_AMBULATORY_CARE_PROVIDER_SITE_OTHER): Payer: Medicare Other | Admitting: Family Medicine

## 2014-06-12 VITALS — BP 118/78 | HR 80 | Temp 98.0°F | Wt 205.5 lb

## 2014-06-12 DIAGNOSIS — M899 Disorder of bone, unspecified: Secondary | ICD-10-CM

## 2014-06-12 DIAGNOSIS — G14 Postpolio syndrome: Secondary | ICD-10-CM

## 2014-06-12 DIAGNOSIS — N539 Unspecified male sexual dysfunction: Secondary | ICD-10-CM

## 2014-06-12 MED ORDER — SILDENAFIL CITRATE 100 MG PO TABS: 50.0000 mg | ORAL_TABLET | Freq: Every day | ORAL | Status: DC | PRN

## 2014-06-12 NOTE — Assessment & Plan Note (Addendum)
Post polio syndrome contributing, but marked dysfunction on left compared to right on exam today. Will refer to PT for further eval/treatment. If not improved with this, will refer to [facility] for further eval. Pt agrees with plan.

## 2014-06-12 NOTE — Progress Notes (Addendum)
BP 118/78 mmHg  Pulse 80  Temp(Src) 98 F (36.7 C) (Oral)  Wt 205 lb 8 oz (93.214 kg)   CC: L shoulderblade pain  Subjective:    Patient ID: Alexander [name], male    DOB: 1950-11-30, 64 y.o.   MRN: [id number]  HPI: Alexander [name] is a 64 y.o. male presenting on 06/12/2014 for Shoulder Pain; Medication Refill; and Fatigue   L shoulderblade pain at scapular area. Noticing ongoing cracking at shoulder blade with elevation of scapula ongoing for last few years that is becoming more painful. Started after fall 3 years ago where he landed on left side. Denies neck pain. Endorses L arm soreness. Worse pain with sleeping on left side. Wears bth suspenders on right shoulder 2/2 L shoulder pain.   Would like viagra refill - 3 pills from local pharmacy and [facility]. cialis was not as effective.   Post polio syndrome. Fatigue - 20lb weight loss over last 4 months, working with wife on healthy diet changes. Body mass index is 28.67 kg/(m^2).   Relevant past medical, surgical, family and social history reviewed and updated as indicated. Interim medical history since our last visit reviewed. Allergies and medications reviewed and updated. Current Outpatient Prescriptions on File Prior to Visit  Medication Sig  . aspirin-acetaminophen-caffeine (EXCEDRIN MIGRAINE) 250-250-65 MG per tablet 12 daily  . atorvastatin (LIPITOR) 40 MG tablet Take 1 tablet (40 mg total) by mouth daily at 6 PM.  . Cholecalciferol (CVS VITAMIN D) 2000 UNITS CAPS Take 1 capsule (2,000 Units total) by mouth daily.  Marland Kitchen ibuprofen (ADVIL,MOTRIN) 200 MG tablet Take 200 mg by mouth every 6 (six) hours as needed.  Marland Kitchen omeprazole (PRILOSEC) 40 MG capsule TAKE 1 CAPSULE DAILY   No current facility-administered medications on file prior to visit.   Past Medical History  Diagnosis Date  . Arthritis   . Depression   . Headache(784.0)   . CAD (coronary artery disease)     3 stents  . HTN (hypertension)   . HLD  (hyperlipidemia)   . Migraines   . Neuropathy   . Post-polio syndrome   . Shoulder pain, right   . OSA (obstructive sleep apnea)     does not want to use CPAP, using mouth guard  . Chronic pain syndrome     [facility]   Review of Systems Per HPI unless specifically indicated above     Objective:    BP 118/78 mmHg  Pulse 80  Temp(Src) 98 F (36.7 C) (Oral)  Wt 205 lb 8 oz (93.214 kg)  Wt Readings from Last 3 Encounters:  06/12/14 205 lb 8 oz (93.214 kg)  01/09/14 227 lb 8 oz (103.193 kg)  10/19/13 232 lb (105.235 kg)    Physical Exam  Constitutional: He appears well-developed and well-nourished. No distress.  Musculoskeletal: He exhibits no edema.  Winging of scapula on left. Tender to palpation throughout L RTC - including supraspinatus, infraspinatus, as well as some discomfort/knot at rhomboid on left. Deteriorated mobility L compared to R on exam.  Skin: Skin is warm and dry. No rash noted.  Nursing note and vitals reviewed.      Assessment & Plan:   Problem List Items Addressed This Visit    Scapular dysfunction - Primary    Post polio syndrome contributing, but marked dysfunction on left compared to right on exam today. Will refer to PT for further eval/treatment. If not improved with this, will refer to [facility] for further eval.  Pt agrees with plan.      Relevant Orders   Ambulatory referral to Physical Therapy   Post-polio syndrome   Male sexual dysfunction    cialis not as effective. Requests return to viagra.  viagra refilled. Coupon provided.          Follow up plan: Return if symptoms worsen or fail to improve.

## 2014-06-12 NOTE — Progress Notes (Signed)
Pre visit review using our clinic review tool, if applicable. No additional management support is needed unless otherwise documented below in the visit note. 

## 2014-06-12 NOTE — Patient Instructions (Signed)
I think you have scapular dysfunction - we will refer you to physical therapy for this. If not better let me know and I will have you see one of our sports medicine doctors. Ok to continue heating pad viagra refilled. Start multivitamin for fatigue.

## 2014-06-12 NOTE — Assessment & Plan Note (Addendum)
cialis not as effective. Requests return to viagra.  viagra refilled. Coupon provided.

## 2014-06-12 NOTE — Addendum Note (Signed)
Addended by: [name] on: 06/12/2014 04:11 PM   Modules accepted: Orders, SmartSet

## 2014-06-14 ENCOUNTER — Encounter: Payer: Self-pay | Admitting: *Deleted

## 2014-06-14 DIAGNOSIS — M25512 Pain in left shoulder: Secondary | ICD-10-CM | POA: Diagnosis not present

## 2014-06-14 DIAGNOSIS — M6281 Muscle weakness (generalized): Secondary | ICD-10-CM | POA: Diagnosis not present

## 2014-06-14 DIAGNOSIS — M25312 Other instability, left shoulder: Secondary | ICD-10-CM | POA: Diagnosis not present

## 2014-06-14 NOTE — Telephone Encounter (Signed)
Encounter opened in error

## 2014-06-15 ENCOUNTER — Telehealth: Payer: Self-pay | Admitting: *Deleted

## 2014-06-15 NOTE — Telephone Encounter (Signed)
Additional info needed for PA for viagra in your IN box for completion.

## 2014-06-15 NOTE — Telephone Encounter (Signed)
Form faxed

## 2014-06-15 NOTE — Telephone Encounter (Signed)
filled and in Kim's box. 

## 2014-06-16 NOTE — Addendum Note (Signed)
Addended by: [name] on: 06/16/2014 11:47 AM   Modules accepted: Miquel Dunn

## 2014-06-18 DIAGNOSIS — M6281 Muscle weakness (generalized): Secondary | ICD-10-CM | POA: Diagnosis not present

## 2014-06-18 DIAGNOSIS — M25312 Other instability, left shoulder: Secondary | ICD-10-CM | POA: Diagnosis not present

## 2014-06-18 DIAGNOSIS — M25512 Pain in left shoulder: Secondary | ICD-10-CM | POA: Diagnosis not present

## 2014-06-20 DIAGNOSIS — M6281 Muscle weakness (generalized): Secondary | ICD-10-CM | POA: Diagnosis not present

## 2014-06-20 DIAGNOSIS — M25512 Pain in left shoulder: Secondary | ICD-10-CM | POA: Diagnosis not present

## 2014-06-20 DIAGNOSIS — M25312 Other instability, left shoulder: Secondary | ICD-10-CM | POA: Diagnosis not present

## 2014-06-27 DIAGNOSIS — M6281 Muscle weakness (generalized): Secondary | ICD-10-CM | POA: Diagnosis not present

## 2014-06-27 DIAGNOSIS — M25512 Pain in left shoulder: Secondary | ICD-10-CM | POA: Diagnosis not present

## 2014-06-27 DIAGNOSIS — M25312 Other instability, left shoulder: Secondary | ICD-10-CM | POA: Diagnosis not present

## 2014-07-01 ENCOUNTER — Other Ambulatory Visit: Payer: Self-pay | Admitting: Family Medicine

## 2014-07-01 DIAGNOSIS — Z125 Encounter for screening for malignant neoplasm of prostate: Secondary | ICD-10-CM

## 2014-07-01 DIAGNOSIS — E785 Hyperlipidemia, unspecified: Secondary | ICD-10-CM

## 2014-07-01 DIAGNOSIS — E559 Vitamin D deficiency, unspecified: Secondary | ICD-10-CM

## 2014-07-01 DIAGNOSIS — I10 Essential (primary) hypertension: Secondary | ICD-10-CM

## 2014-07-01 DIAGNOSIS — R7303 Prediabetes: Secondary | ICD-10-CM

## 2014-07-03 DIAGNOSIS — M25312 Other instability, left shoulder: Secondary | ICD-10-CM | POA: Diagnosis not present

## 2014-07-03 DIAGNOSIS — M6281 Muscle weakness (generalized): Secondary | ICD-10-CM | POA: Diagnosis not present

## 2014-07-03 DIAGNOSIS — M25512 Pain in left shoulder: Secondary | ICD-10-CM | POA: Diagnosis not present

## 2014-07-05 ENCOUNTER — Other Ambulatory Visit (INDEPENDENT_AMBULATORY_CARE_PROVIDER_SITE_OTHER): Payer: Medicare Other

## 2014-07-05 DIAGNOSIS — R7309 Other abnormal glucose: Secondary | ICD-10-CM | POA: Diagnosis not present

## 2014-07-05 DIAGNOSIS — M25512 Pain in left shoulder: Secondary | ICD-10-CM | POA: Diagnosis not present

## 2014-07-05 DIAGNOSIS — M25312 Other instability, left shoulder: Secondary | ICD-10-CM | POA: Diagnosis not present

## 2014-07-05 DIAGNOSIS — E559 Vitamin D deficiency, unspecified: Secondary | ICD-10-CM | POA: Diagnosis not present

## 2014-07-05 DIAGNOSIS — Z125 Encounter for screening for malignant neoplasm of prostate: Secondary | ICD-10-CM

## 2014-07-05 DIAGNOSIS — M6281 Muscle weakness (generalized): Secondary | ICD-10-CM | POA: Diagnosis not present

## 2014-07-05 DIAGNOSIS — E785 Hyperlipidemia, unspecified: Secondary | ICD-10-CM

## 2014-07-05 DIAGNOSIS — R7303 Prediabetes: Secondary | ICD-10-CM

## 2014-07-05 LAB — LIPID PANEL
Cholesterol: 209 mg/dL — ABNORMAL HIGH (ref 0–200)
HDL: 54.8 mg/dL (ref >39.00)
LDL CALC: 134 mg/dL — AB (ref 0–99)
NonHDL: 154.2
Total CHOL/HDL Ratio: 4
Triglycerides: 101 mg/dL (ref 0.0–149.0)
VLDL: 20.2 mg/dL (ref 0.0–40.0)

## 2014-07-05 LAB — COMPREHENSIVE METABOLIC PANEL
ALK PHOS: 56 U/L (ref 39–117)
ALT: 40 U/L (ref 0–53)
AST: 28 U/L (ref 0–37)
Albumin: 4.4 g/dL (ref 3.5–5.2)
BUN: 19 mg/dL (ref 6–23)
CO2: 25 mEq/L (ref 19–32)
CREATININE: 0.82 mg/dL (ref 0.40–1.50)
Calcium: 9.6 mg/dL (ref 8.4–10.5)
Chloride: 103 mEq/L (ref 96–112)
GFR: 100.43 mL/min (ref >60.00)
Glucose, Bld: 99 mg/dL (ref 70–99)
POTASSIUM: 4.1 meq/L (ref 3.5–5.1)
Sodium: 139 mEq/L (ref 135–145)
Total Bilirubin: 0.4 mg/dL (ref 0.2–1.2)
Total Protein: 7.2 g/dL (ref 6.0–8.3)

## 2014-07-05 LAB — HEMOGLOBIN A1C: HEMOGLOBIN A1C: 5.9 % (ref 4.6–6.5)

## 2014-07-05 LAB — VITAMIN D 25 HYDROXY (VIT D DEFICIENCY, FRACTURES): VITD: 26.12 ng/mL — ABNORMAL LOW (ref 30.00–100.00)

## 2014-07-05 LAB — PSA, MEDICARE: PSA: 1.36 ng/ml (ref 0.10–4.00)

## 2014-07-10 DIAGNOSIS — M25512 Pain in left shoulder: Secondary | ICD-10-CM | POA: Diagnosis not present

## 2014-07-10 DIAGNOSIS — M25312 Other instability, left shoulder: Secondary | ICD-10-CM | POA: Diagnosis not present

## 2014-07-10 DIAGNOSIS — M6281 Muscle weakness (generalized): Secondary | ICD-10-CM | POA: Diagnosis not present

## 2014-07-12 ENCOUNTER — Encounter: Payer: Self-pay | Admitting: Family Medicine

## 2014-07-12 ENCOUNTER — Other Ambulatory Visit: Payer: Self-pay | Admitting: *Deleted

## 2014-07-12 ENCOUNTER — Ambulatory Visit (INDEPENDENT_AMBULATORY_CARE_PROVIDER_SITE_OTHER): Payer: Medicare Other | Admitting: Family Medicine

## 2014-07-12 VITALS — BP 128/80 | HR 86 | Temp 97.7°F | Ht 70.0 in | Wt 195.8 lb

## 2014-07-12 DIAGNOSIS — G14 Postpolio syndrome: Secondary | ICD-10-CM | POA: Diagnosis not present

## 2014-07-12 DIAGNOSIS — M25312 Other instability, left shoulder: Secondary | ICD-10-CM | POA: Diagnosis not present

## 2014-07-12 DIAGNOSIS — F329 Major depressive disorder, single episode, unspecified: Secondary | ICD-10-CM | POA: Diagnosis not present

## 2014-07-12 DIAGNOSIS — I10 Essential (primary) hypertension: Secondary | ICD-10-CM

## 2014-07-12 DIAGNOSIS — E663 Overweight: Secondary | ICD-10-CM

## 2014-07-12 DIAGNOSIS — E559 Vitamin D deficiency, unspecified: Secondary | ICD-10-CM | POA: Diagnosis not present

## 2014-07-12 DIAGNOSIS — Z6825 Body mass index (BMI) 25.0-25.9, adult: Secondary | ICD-10-CM | POA: Insufficient documentation

## 2014-07-12 DIAGNOSIS — M899 Disorder of bone, unspecified: Secondary | ICD-10-CM | POA: Diagnosis not present

## 2014-07-12 DIAGNOSIS — Z7189 Other specified counseling: Secondary | ICD-10-CM | POA: Diagnosis not present

## 2014-07-12 DIAGNOSIS — E785 Hyperlipidemia, unspecified: Secondary | ICD-10-CM

## 2014-07-12 DIAGNOSIS — R7303 Prediabetes: Secondary | ICD-10-CM

## 2014-07-12 DIAGNOSIS — R7309 Other abnormal glucose: Secondary | ICD-10-CM

## 2014-07-12 DIAGNOSIS — F32A Depression, unspecified: Secondary | ICD-10-CM

## 2014-07-12 DIAGNOSIS — M25512 Pain in left shoulder: Secondary | ICD-10-CM | POA: Diagnosis not present

## 2014-07-12 DIAGNOSIS — Z0000 Encounter for general adult medical examination without abnormal findings: Secondary | ICD-10-CM | POA: Diagnosis not present

## 2014-07-12 DIAGNOSIS — M6281 Muscle weakness (generalized): Secondary | ICD-10-CM | POA: Diagnosis not present

## 2014-07-12 DIAGNOSIS — I2510 Atherosclerotic heart disease of native coronary artery without angina pectoris: Secondary | ICD-10-CM

## 2014-07-12 DIAGNOSIS — E669 Obesity, unspecified: Secondary | ICD-10-CM | POA: Insufficient documentation

## 2014-07-12 MED ORDER — METHOCARBAMOL 500 MG PO TABS: 500.0000 mg | ORAL_TABLET | Freq: Three times a day (TID) | ORAL | Status: DC | PRN

## 2014-07-12 NOTE — Assessment & Plan Note (Signed)
Continue 2000 IU daily.  

## 2014-07-12 NOTE — Assessment & Plan Note (Signed)

## 2014-07-12 NOTE — Assessment & Plan Note (Signed)
Off lipitor. rec restart given personal CAD hx. Goal LDL <70.

## 2014-07-12 NOTE — Progress Notes (Signed)
BP 128/80 mmHg  Pulse 86  Temp(Src) 97.7 F (36.5 C) (Oral)  Ht 5\' 10"  (1.778 m)  Wt 195 lb 12 oz (88.792 kg)  BMI 28.09 kg/m2   CC: medicare wellness visit Subjective:    Patient ID: Alexander [name], male    DOB: 09/18/1950, 64 y.o.   MRN: [id number]  HPI: Alexander [name] is a 64 y.o. male presenting on 07/12/2014 for Annual Exam   Scapular dysfunction - PT helping significantly.  Weight loss noted over the last year, trying - eating healthier, staying active. Has been partnering with wife on [facility] weight loss program. Target weight is 179lbs. Discussed goal likely 185lbs.  Significant excedrin use for body aches daily (9 to 12 pills daily). Muscle relaxants ineffective (baclofen and skelaxin).  HLD - off lipitor since 12/2013. Has changed diet.   Passes hearing screen today. Recent eye exam screen 11/2013 No falls in last year, but high fall risk due to post polio syndrome. Uses braces to walk. Rides stationary bike at home. Denies depression/anhedonia, sadness.  Preventative: COLONOSCOPY Date: 09/2013 TA x2, rpt 5 yrs (Pyrt[facility]) Prostate cancer screening - desires continued screening Flu shot 05/2014 Td 2013 zostavax - 06/2013 Advanced directives: to set up at home. Wife would be HCPOA. Does not want prolonged life support. Wants to be cremated.  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 Interim medical history since our last visit reviewed. Allergies and medications reviewed and updated. Current Outpatient Prescriptions on File Prior to Visit  Medication Sig  . aspirin-acetaminophen-caffeine (EXCEDRIN MIGRAINE) 250-250-65 MG per tablet 12 daily  .  atorvastatin (LIPITOR) 40 MG tablet Take 1 tablet (40 mg total) by mouth daily at 6 PM.  . Cholecalciferol (CVS VITAMIN D) 2000 UNITS CAPS Take 1 capsule (2,000 Units total) by mouth daily.  Marland Kitchen gabapentin (NEURONTIN) 600 MG tablet TAKE 2 TABLETS THREE TIMES A DAY  . ibuprofen (ADVIL,MOTRIN) 200 MG tablet Take 200 mg by mouth every 6 (six) hours as needed.  Marland Kitchen lisinopril (PRINIVIL,ZESTRIL) 10 MG tablet TAKE 1 TABLET DAILY  . Multiple Vitamins-Minerals (MULTIVITAMIN ADULTS 50+ PO) Take 1 tablet by mouth daily.  . Omega-3 Fatty Acids (FISH OIL) 1000 MG CAPS Take 1 capsule by mouth daily.  Marland Kitchen omeprazole (PRILOSEC) 40 MG capsule TAKE 1 CAPSULE DAILY  . sildenafil (VIAGRA) 100 MG tablet Take 0.5-1 tablets (50-100 mg total) by mouth daily as needed for erectile dysfunction.   No current facility-administered medications on file prior to visit.    Review of Systems Per HPI unless specifically indicated above     Objective:    BP 128/80 mmHg  Pulse 86  Temp(Src) 97.7 F (36.5 C) (Oral)  Ht 5\' 10"  (1.778 m)  Wt 195 lb 12 oz (88.792 kg)  BMI 28.09 kg/m2  Wt Readings from Last 3 Encounters:  07/12/14 195 lb 12 oz (88.792 kg)  06/12/14 205 lb 8 oz (93.214 kg)  01/09/14 227 lb 8 oz (103.193 kg)    Physical Exam  Constitutional: He is oriented to person, place, and time. He appears well-developed and well-nourished. No distress.  HENT:  Head: Normocephalic and atraumatic.  Right Ear: Hearing, tympanic membrane, external ear and ear canal normal.  Left Ear: Hearing,  tympanic membrane, external ear and ear canal normal.  Nose: Nose normal.  Mouth/Throat: Uvula is midline, oropharynx is clear and moist and mucous membranes are normal. No oropharyngeal exudate, posterior oropharyngeal edema or posterior oropharyngeal erythema.  Eyes: Conjunctivae and EOM are normal. Pupils are equal, round, and reactive to light. No scleral icterus.  Neck: Normal range of motion. Neck supple. Carotid bruit is not  present. No thyromegaly present.  Cardiovascular: Normal rate, regular rhythm, normal heart sounds and intact distal pulses.   No murmur heard. Pulses:      Radial pulses are 2+ on the right side, and 2+ on the left side.  Pulmonary/Chest: Effort normal and breath sounds normal. No respiratory distress. He has no wheezes. He has no rales.  Abdominal: Soft. Bowel sounds are normal. He exhibits no distension and no mass. There is no tenderness. There is no rebound and no guarding.  Genitourinary: Rectum normal and prostate normal. Rectal exam shows no external hemorrhoid, no internal hemorrhoid, no fissure, no mass, no tenderness and anal tone normal. Prostate is not enlarged (20gm) and not tender.  Musculoskeletal: Normal range of motion. He exhibits no edema.  Lymphadenopathy:    He has no cervical adenopathy.  Neurological: He is alert and oriented to person, place, and time.  CN grossly intact, station and gait intact Recall 3/3 Calculation 5/5 serial 7s  Skin: Skin is warm and dry. No rash noted.  Psychiatric: He has a normal mood and affect. His behavior is normal. Judgment and thought content normal.  Nursing note and vitals reviewed.  Results for orders placed or performed in visit on 07/05/14  Lipid panel  Result Value Ref Range   Cholesterol 209 (H) 0 - 200 mg/dL   Triglycerides 101.0 0.0 - 149.0 mg/dL   HDL 54.80 >39.00 mg/dL   VLDL 20.2 0.0 - 40.0 mg/dL   LDL Cholesterol 134 (H) 0 - 99 mg/dL   Total CHOL/HDL Ratio 4    NonHDL 154.20   Comprehensive metabolic panel  Result Value Ref Range   Sodium 139 135 - 145 mEq/L   Potassium 4.1 3.5 - 5.1 mEq/L   Chloride 103 96 - 112 mEq/L   CO2 25 19 - 32 mEq/L   Glucose, Bld 99 70 - 99 mg/dL   BUN 19 6 - 23 mg/dL   Creatinine, Ser 0.82 0.40 - 1.50 mg/dL   Total Bilirubin 0.4 0.2 - 1.2 mg/dL   Alkaline Phosphatase 56 39 - 117 U/L   AST 28 0 - 37 U/L   ALT 40 0 - 53 U/L   Total Protein 7.2 6.0 - 8.3 g/dL   Albumin 4.4 3.5 -  5.2 g/dL   Calcium 9.6 8.4 - 10.5 mg/dL   GFR 100.43 >60.00 mL/min  Hemoglobin A1c  Result Value Ref Range   Hgb A1c MFr Bld 5.9 4.6 - 6.5 %  Vit D  25 hydroxy (rtn osteoporosis monitoring)  Result Value Ref Range   VITD 26.12 (L) 30.00 - 100.00 ng/mL  PSA, Medicare  Result Value Ref Range   PSA 1.36 0.10 - 4.00 ng/ml      Assessment & Plan:   Problem List Items Addressed This Visit    Vitamin D deficiency    Continue 2000 IU daily.      Scapular dysfunction    Satisfied with progress with PT. Endorses 75% improvement. Has 1 more session.      Prediabetes    Reviewed improving #s.      Post-polio  syndrome    Chronic, stable.      Overweight    Discussed weight, I'm happy with current weight, recommend 185-190lb as goal weight.      Medicare annual wellness visit, subsequent - Primary    I have personally reviewed the Medicare Annual Wellness questionnaire and have noted 1. [name] medical and social history 2. Their use of alcohol, tobacco or illicit drugs 3. Their current medications and supplements 4. [name] functional ability including ADL's, fall risks, home safety risks and hearing or visual impairment. 5. Diet and physical activity 6. Evidence for depression or mood disorders The patients weight, height, BMI have been recorded in the chart.  Hearing and vision has been addressed. I have made referrals, counseling and provided education to the patient based review of the above and I have provided the pt with a written personalized care plan for preventive services. Provider list updated - see scanned questionairre. Reviewed preventative protocols and updated unless pt declined.       HTN (hypertension)    Chronic, stable on lisinopril 10mg  daily.      HLD (hyperlipidemia)    Off lipitor. rec restart given personal CAD hx. Goal LDL <70.      RESOLVED: Depression    No evidence of this currently. Will remove from active problem list.        CAD (coronary artery disease)    Stable, asxs.      Advanced care planning/counseling discussion    Advanced directives: to set up at home. Wife would be HC proxy. Does not want prolonged life support. Wants to be cremated.          Follow up plan: Return in about 1 year (around 07/13/2015), or as needed, for medicare wellness.

## 2014-07-12 NOTE — Assessment & Plan Note (Signed)
Chronic, stable on lisinopril 10mg daily.  

## 2014-07-12 NOTE — Assessment & Plan Note (Signed)
Advanced directives: to set up at home. Wife would be HC proxy. Does not want prolonged life support. Wants to be cremated.

## 2014-07-12 NOTE — Progress Notes (Signed)
Pre visit review using our clinic review tool, if applicable. No additional management support is needed unless otherwise documented below in the visit note. 

## 2014-07-12 NOTE — Assessment & Plan Note (Signed)
Chronic, stable 

## 2014-07-12 NOTE — Assessment & Plan Note (Signed)
Discussed weight, I'm happy with current weight, recommend 185-190lb as goal weight.

## 2014-07-12 NOTE — Assessment & Plan Note (Signed)
Reviewed improving #s.

## 2014-07-12 NOTE — Assessment & Plan Note (Signed)
Satisfied with progress with PT. Endorses 75% improvement. Has 1 more session.

## 2014-07-12 NOTE — Patient Instructions (Addendum)
Restart lipitor 40mg  daily. Try robaxin three times daily as needed (muscle relaxant). Keep backing off excedrin. Set up advanced directive at home. Packet provided today. Congratulations on weight loss! I'd be happy with goal weight of 185-190lbs. Return as needed or in 1 year for next medicare wellness visit.

## 2014-07-12 NOTE — Assessment & Plan Note (Signed)
No evidence of this currently. Will remove from active problem list.

## 2014-07-12 NOTE — Assessment & Plan Note (Signed)
Stable, asxs.

## 2014-07-19 DIAGNOSIS — E559 Vitamin D deficiency, unspecified: Secondary | ICD-10-CM | POA: Diagnosis not present

## 2014-07-19 DIAGNOSIS — R635 Abnormal weight gain: Secondary | ICD-10-CM | POA: Diagnosis not present

## 2014-07-19 DIAGNOSIS — E291 Testicular hypofunction: Secondary | ICD-10-CM | POA: Diagnosis not present

## 2014-07-19 DIAGNOSIS — E663 Overweight: Secondary | ICD-10-CM | POA: Diagnosis not present

## 2014-07-19 DIAGNOSIS — E8881 Metabolic syndrome: Secondary | ICD-10-CM | POA: Diagnosis not present

## 2014-07-19 DIAGNOSIS — I2510 Atherosclerotic heart disease of native coronary artery without angina pectoris: Secondary | ICD-10-CM | POA: Diagnosis not present

## 2014-07-20 DIAGNOSIS — M25312 Other instability, left shoulder: Secondary | ICD-10-CM | POA: Diagnosis not present

## 2014-07-20 DIAGNOSIS — M25512 Pain in left shoulder: Secondary | ICD-10-CM | POA: Diagnosis not present

## 2014-07-20 DIAGNOSIS — M6281 Muscle weakness (generalized): Secondary | ICD-10-CM | POA: Diagnosis not present

## 2014-07-25 DIAGNOSIS — I2510 Atherosclerotic heart disease of native coronary artery without angina pectoris: Secondary | ICD-10-CM | POA: Diagnosis not present

## 2014-07-25 DIAGNOSIS — E663 Overweight: Secondary | ICD-10-CM | POA: Diagnosis not present

## 2014-07-25 DIAGNOSIS — E8881 Metabolic syndrome: Secondary | ICD-10-CM | POA: Diagnosis not present

## 2014-07-25 DIAGNOSIS — E538 Deficiency of other specified B group vitamins: Secondary | ICD-10-CM | POA: Diagnosis not present

## 2014-07-25 DIAGNOSIS — E291 Testicular hypofunction: Secondary | ICD-10-CM | POA: Diagnosis not present

## 2014-07-25 DIAGNOSIS — E559 Vitamin D deficiency, unspecified: Secondary | ICD-10-CM | POA: Diagnosis not present

## 2014-08-03 ENCOUNTER — Other Ambulatory Visit: Payer: Self-pay | Admitting: Family Medicine

## 2014-08-13 DIAGNOSIS — E663 Overweight: Secondary | ICD-10-CM | POA: Diagnosis not present

## 2014-08-13 DIAGNOSIS — E291 Testicular hypofunction: Secondary | ICD-10-CM | POA: Diagnosis not present

## 2014-08-13 DIAGNOSIS — E538 Deficiency of other specified B group vitamins: Secondary | ICD-10-CM | POA: Diagnosis not present

## 2014-08-13 DIAGNOSIS — R635 Abnormal weight gain: Secondary | ICD-10-CM | POA: Diagnosis not present

## 2014-08-13 DIAGNOSIS — I2510 Atherosclerotic heart disease of native coronary artery without angina pectoris: Secondary | ICD-10-CM | POA: Diagnosis not present

## 2014-08-13 DIAGNOSIS — E8881 Metabolic syndrome: Secondary | ICD-10-CM | POA: Diagnosis not present

## 2014-08-13 DIAGNOSIS — R7309 Other abnormal glucose: Secondary | ICD-10-CM | POA: Diagnosis not present

## 2014-08-13 DIAGNOSIS — E559 Vitamin D deficiency, unspecified: Secondary | ICD-10-CM | POA: Diagnosis not present

## 2014-08-21 DIAGNOSIS — I2510 Atherosclerotic heart disease of native coronary artery without angina pectoris: Secondary | ICD-10-CM | POA: Diagnosis not present

## 2014-08-21 DIAGNOSIS — E8881 Metabolic syndrome: Secondary | ICD-10-CM | POA: Diagnosis not present

## 2014-08-21 DIAGNOSIS — E559 Vitamin D deficiency, unspecified: Secondary | ICD-10-CM | POA: Diagnosis not present

## 2014-08-21 DIAGNOSIS — E663 Overweight: Secondary | ICD-10-CM | POA: Diagnosis not present

## 2014-08-21 DIAGNOSIS — E538 Deficiency of other specified B group vitamins: Secondary | ICD-10-CM | POA: Diagnosis not present

## 2014-09-10 DIAGNOSIS — E559 Vitamin D deficiency, unspecified: Secondary | ICD-10-CM | POA: Diagnosis not present

## 2014-09-10 DIAGNOSIS — E291 Testicular hypofunction: Secondary | ICD-10-CM | POA: Diagnosis not present

## 2014-09-10 DIAGNOSIS — E663 Overweight: Secondary | ICD-10-CM | POA: Diagnosis not present

## 2014-09-10 DIAGNOSIS — I2510 Atherosclerotic heart disease of native coronary artery without angina pectoris: Secondary | ICD-10-CM | POA: Diagnosis not present

## 2014-09-10 DIAGNOSIS — E538 Deficiency of other specified B group vitamins: Secondary | ICD-10-CM | POA: Diagnosis not present

## 2014-09-10 DIAGNOSIS — E8881 Metabolic syndrome: Secondary | ICD-10-CM | POA: Diagnosis not present

## 2014-09-28 ENCOUNTER — Other Ambulatory Visit: Payer: Self-pay | Admitting: Family Medicine

## 2014-09-28 NOTE — Telephone Encounter (Signed)
Ok to refill 

## 2014-10-04 ENCOUNTER — Other Ambulatory Visit: Payer: Self-pay | Admitting: Family Medicine

## 2014-10-04 NOTE — Telephone Encounter (Signed)
Ok to refill 

## 2014-10-23 ENCOUNTER — Other Ambulatory Visit: Payer: Self-pay | Admitting: *Deleted

## 2014-10-23 MED ORDER — SILDENAFIL CITRATE 100 MG PO TABS: 50.0000 mg | ORAL_TABLET | Freq: Every day | ORAL | Status: DC | PRN

## 2014-11-26 ENCOUNTER — Other Ambulatory Visit: Payer: Self-pay | Admitting: Family Medicine

## 2014-12-09 ENCOUNTER — Other Ambulatory Visit: Payer: Self-pay | Admitting: Family Medicine

## 2015-01-03 ENCOUNTER — Encounter: Payer: Self-pay | Admitting: Family Medicine

## 2015-01-03 ENCOUNTER — Ambulatory Visit (INDEPENDENT_AMBULATORY_CARE_PROVIDER_SITE_OTHER): Payer: Medicare Other | Admitting: Family Medicine

## 2015-01-03 VITALS — BP 112/62 | HR 68 | Temp 98.4°F | Wt 193.8 lb

## 2015-01-03 DIAGNOSIS — R229 Localized swelling, mass and lump, unspecified: Secondary | ICD-10-CM | POA: Diagnosis not present

## 2015-01-03 DIAGNOSIS — I2510 Atherosclerotic heart disease of native coronary artery without angina pectoris: Secondary | ICD-10-CM

## 2015-01-03 MED ORDER — SILDENAFIL CITRATE 100 MG PO TABS: 50.0000 mg | ORAL_TABLET | Freq: Every day | ORAL | Status: DC | PRN

## 2015-01-03 NOTE — Progress Notes (Signed)
BP 112/62 mmHg  Pulse 68  Temp(Src) 98.4 F (36.9 C) (Oral)  Wt 193 lb 12 oz (87.884 kg)   CC: L breast knot  Subjective:    Patient ID: Alexander [name], male    DOB: [birth year], 64 y.o.   MRN: [id number]  HPI: Alexander [name] is a 64 y.o. male presenting on 01/03/2015 for Knot on chest   Small spot medial to left nipple present for 8 wks. Thought it may have been bug bite. Getting tender but no itching. Hasn't noticed any other swelling or mass.  Brother with h/o breast cancer, passed away 2023-01-18 this year.  Relevant past medical, surgical, family and social history reviewed and updated as indicated. Interim medical history since our last visit reviewed. Allergies and medications reviewed and updated. Current Outpatient Prescriptions on File Prior to Visit  Medication Sig  . aspirin-acetaminophen-caffeine (EXCEDRIN MIGRAINE) 250-250-65 MG per tablet 12 daily  . atorvastatin (LIPITOR) 40 MG tablet TAKE 1 TABLET DAILY AT 6 P.M.  . Cholecalciferol ([employer] VITAMIN D) 2000 UNITS CAPS Take 1 capsule (2,000 Units total) by mouth daily.  Marland Kitchen gabapentin (NEURONTIN) 600 MG tablet TAKE 2 TABLETS THREE TIMES A DAY  . ibuprofen (ADVIL,MOTRIN) 200 MG tablet Take 200 mg by mouth every 6 (six) hours as needed.  Marland Kitchen lisinopril (PRINIVIL,ZESTRIL) 10 MG tablet TAKE 1 TABLET DAILY  . methocarbamol (ROBAXIN) 500 MG tablet TAKE 1 TABLET THREE TIMES A DAY AS NEEDED FOR MUSCLE SPASM  . Multiple Vitamins-Minerals (MULTIVITAMIN ADULTS 50+ PO) Take 1 tablet by mouth daily.  . Omega-3 Fatty Acids (FISH OIL) 1000 MG CAPS Take 1 capsule by mouth daily.  Marland Kitchen omeprazole (PRILOSEC) 40 MG capsule TAKE 1 CAPSULE DAILY   No current facility-administered medications on file prior to visit.    Review of Systems Per HPI unless specifically indicated above     Objective:    BP 112/62 mmHg  Pulse 68  Temp(Src) 98.4 F (36.9 C) (Oral)  Wt 193 lb 12 oz (87.884 kg)  Wt Readings from Last 3 Encounters:  01/03/15 193 lb 12 oz  (87.884 kg)  07/12/14 195 lb 12 oz (88.792 kg)  06/12/14 205 lb 8 oz (93.214 kg)    Physical Exam  Constitutional: He appears well-developed and well-nourished. No distress.  Pulmonary/Chest: Right breast exhibits no inverted nipple, no mass, no nipple discharge, no skin change and no tenderness. Left breast exhibits mass. Left breast exhibits no inverted nipple, no nipple discharge, no skin change and no tenderness.  65mm nontender subcutaneous knot present medial and superior to left nipple, not involving breast tissue.  Lymphadenopathy:    He has no axillary adenopathy.       Right axillary: No pectoral and no lateral adenopathy present.       Left axillary: No pectoral and no lateral adenopathy present.      Right: No supraclavicular adenopathy present.       Left: No supraclavicular adenopathy present.  Nursing note and vitals reviewed.      Assessment & Plan:   Problem List Items Addressed This Visit    Lump of skin - Primary    Not in breast tissue, started after bug bite. Anticipate scarred nodule after bug bite. Reassured. rec warm compresses to skin for next 2-3 wks to encourage resolution. Update if not improving with this, or if enlarging. Low threshold to check mammo/US given fmhx.          Follow up plan: Return if symptoms worsen or fail to  improve.

## 2015-01-03 NOTE — Assessment & Plan Note (Signed)
Not in breast tissue, started after bug bite. Anticipate scarred nodule after bug bite. Reassured. rec warm compresses to skin for next 2-3 wks to encourage resolution. Update if not improving with this, or if enlarging. Low threshold to check mammo/US given fmhx.

## 2015-01-03 NOTE — Patient Instructions (Signed)
Knot feels like a small scarred nodule on the skin not in the breast. Treat with warm compresses twice to three times daily.Marland Kitchen Update me in 2-3 weeks if not improving, sooner if any enlargement.

## 2015-01-03 NOTE — Progress Notes (Signed)
Pre visit review using our clinic review tool, if applicable. No additional management support is needed unless otherwise documented below in the visit note. 

## 2015-01-05 ENCOUNTER — Other Ambulatory Visit: Payer: Self-pay | Admitting: Family Medicine

## 2015-02-28 DIAGNOSIS — Z23 Encounter for immunization: Secondary | ICD-10-CM | POA: Diagnosis not present

## 2015-03-09 ENCOUNTER — Other Ambulatory Visit: Payer: Self-pay | Admitting: Family Medicine

## 2015-04-02 DIAGNOSIS — I214 Non-ST elevation (NSTEMI) myocardial infarction: Secondary | ICD-10-CM

## 2015-04-02 DIAGNOSIS — J189 Pneumonia, unspecified organism: Secondary | ICD-10-CM

## 2015-04-02 HISTORY — DX: Pneumonia, unspecified organism: J18.9

## 2015-04-02 HISTORY — DX: Non-ST elevation (NSTEMI) myocardial infarction: I21.4

## 2015-04-02 HISTORY — PX: TRANSTHORACIC ECHOCARDIOGRAM: SHX275

## 2015-04-24 DIAGNOSIS — R509 Fever, unspecified: Secondary | ICD-10-CM | POA: Diagnosis not present

## 2015-04-25 ENCOUNTER — Emergency Department (HOSPITAL_COMMUNITY): Payer: Medicare Other

## 2015-04-25 ENCOUNTER — Inpatient Hospital Stay (HOSPITAL_COMMUNITY)
Admission: EM | Admit: 2015-04-25 | Discharge: 2015-05-02 | DRG: 871 | Disposition: A | Discharge status: Home / Self Care | Payer: Medicare Other | Attending: Internal Medicine | Admitting: Internal Medicine

## 2015-04-25 ENCOUNTER — Inpatient Hospital Stay (HOSPITAL_COMMUNITY): Payer: Medicare Other

## 2015-04-25 ENCOUNTER — Encounter (HOSPITAL_COMMUNITY): Payer: Self-pay | Admitting: Emergency Medicine

## 2015-04-25 DIAGNOSIS — A4189 Other specified sepsis: Secondary | ICD-10-CM | POA: Diagnosis present

## 2015-04-25 DIAGNOSIS — I252 Old myocardial infarction: Secondary | ICD-10-CM

## 2015-04-25 DIAGNOSIS — Z955 Presence of coronary angioplasty implant and graft: Secondary | ICD-10-CM

## 2015-04-25 DIAGNOSIS — K5900 Constipation, unspecified: Secondary | ICD-10-CM | POA: Diagnosis present

## 2015-04-25 DIAGNOSIS — G14 Postpolio syndrome: Secondary | ICD-10-CM | POA: Diagnosis present

## 2015-04-25 DIAGNOSIS — I480 Paroxysmal atrial fibrillation: Secondary | ICD-10-CM | POA: Diagnosis not present

## 2015-04-25 DIAGNOSIS — R05 Cough: Secondary | ICD-10-CM

## 2015-04-25 DIAGNOSIS — R1084 Generalized abdominal pain: Secondary | ICD-10-CM | POA: Diagnosis not present

## 2015-04-25 DIAGNOSIS — R079 Chest pain, unspecified: Secondary | ICD-10-CM | POA: Diagnosis not present

## 2015-04-25 DIAGNOSIS — I2489 Other forms of acute ischemic heart disease: Secondary | ICD-10-CM | POA: Insufficient documentation

## 2015-04-25 DIAGNOSIS — I10 Essential (primary) hypertension: Secondary | ICD-10-CM | POA: Diagnosis present

## 2015-04-25 DIAGNOSIS — I214 Non-ST elevation (NSTEMI) myocardial infarction: Secondary | ICD-10-CM | POA: Diagnosis not present

## 2015-04-25 DIAGNOSIS — I25118 Atherosclerotic heart disease of native coronary artery with other forms of angina pectoris: Secondary | ICD-10-CM | POA: Diagnosis present

## 2015-04-25 DIAGNOSIS — I5022 Chronic systolic (congestive) heart failure: Secondary | ICD-10-CM | POA: Diagnosis present

## 2015-04-25 DIAGNOSIS — Z7982 Long term (current) use of aspirin: Secondary | ICD-10-CM | POA: Diagnosis not present

## 2015-04-25 DIAGNOSIS — I255 Ischemic cardiomyopathy: Secondary | ICD-10-CM | POA: Diagnosis present

## 2015-04-25 DIAGNOSIS — E785 Hyperlipidemia, unspecified: Secondary | ICD-10-CM | POA: Diagnosis not present

## 2015-04-25 DIAGNOSIS — J189 Pneumonia, unspecified organism: Secondary | ICD-10-CM | POA: Diagnosis not present

## 2015-04-25 DIAGNOSIS — E872 Acidosis: Secondary | ICD-10-CM | POA: Diagnosis present

## 2015-04-25 DIAGNOSIS — K819 Cholecystitis, unspecified: Secondary | ICD-10-CM | POA: Diagnosis not present

## 2015-04-25 DIAGNOSIS — E876 Hypokalemia: Secondary | ICD-10-CM

## 2015-04-25 DIAGNOSIS — Z79899 Other long term (current) drug therapy: Secondary | ICD-10-CM

## 2015-04-25 DIAGNOSIS — G894 Chronic pain syndrome: Secondary | ICD-10-CM | POA: Diagnosis present

## 2015-04-25 DIAGNOSIS — K219 Gastro-esophageal reflux disease without esophagitis: Secondary | ICD-10-CM | POA: Diagnosis present

## 2015-04-25 DIAGNOSIS — G4733 Obstructive sleep apnea (adult) (pediatric): Secondary | ICD-10-CM | POA: Diagnosis present

## 2015-04-25 DIAGNOSIS — A419 Sepsis, unspecified organism: Secondary | ICD-10-CM | POA: Diagnosis not present

## 2015-04-25 DIAGNOSIS — G43909 Migraine, unspecified, not intractable, without status migrainosus: Secondary | ICD-10-CM | POA: Diagnosis present

## 2015-04-25 DIAGNOSIS — Z809 Family history of malignant neoplasm, unspecified: Secondary | ICD-10-CM

## 2015-04-25 DIAGNOSIS — R509 Fever, unspecified: Secondary | ICD-10-CM | POA: Diagnosis not present

## 2015-04-25 DIAGNOSIS — R1030 Lower abdominal pain, unspecified: Secondary | ICD-10-CM | POA: Diagnosis not present

## 2015-04-25 DIAGNOSIS — Z833 Family history of diabetes mellitus: Secondary | ICD-10-CM | POA: Diagnosis not present

## 2015-04-25 DIAGNOSIS — R739 Hyperglycemia, unspecified: Secondary | ICD-10-CM | POA: Diagnosis present

## 2015-04-25 DIAGNOSIS — B91 Sequelae of poliomyelitis: Secondary | ICD-10-CM

## 2015-04-25 DIAGNOSIS — I2510 Atherosclerotic heart disease of native coronary artery without angina pectoris: Secondary | ICD-10-CM | POA: Diagnosis not present

## 2015-04-25 DIAGNOSIS — A4151 Sepsis due to Escherichia coli [E. coli]: Principal | ICD-10-CM | POA: Diagnosis present

## 2015-04-25 DIAGNOSIS — I4891 Unspecified atrial fibrillation: Secondary | ICD-10-CM | POA: Diagnosis not present

## 2015-04-25 DIAGNOSIS — R0602 Shortness of breath: Secondary | ICD-10-CM

## 2015-04-25 DIAGNOSIS — R918 Other nonspecific abnormal finding of lung field: Secondary | ICD-10-CM | POA: Diagnosis not present

## 2015-04-25 DIAGNOSIS — G629 Polyneuropathy, unspecified: Secondary | ICD-10-CM | POA: Diagnosis present

## 2015-04-25 DIAGNOSIS — R7881 Bacteremia: Secondary | ICD-10-CM

## 2015-04-25 DIAGNOSIS — Y95 Nosocomial condition: Secondary | ICD-10-CM | POA: Diagnosis not present

## 2015-04-25 DIAGNOSIS — Z462 Encounter for fitting and adjustment of other devices related to nervous system and special senses: Secondary | ICD-10-CM | POA: Diagnosis not present

## 2015-04-25 DIAGNOSIS — R6521 Severe sepsis with septic shock: Secondary | ICD-10-CM | POA: Diagnosis not present

## 2015-04-25 DIAGNOSIS — R059 Cough, unspecified: Secondary | ICD-10-CM

## 2015-04-25 DIAGNOSIS — K810 Acute cholecystitis: Secondary | ICD-10-CM | POA: Diagnosis present

## 2015-04-25 DIAGNOSIS — I248 Other forms of acute ischemic heart disease: Secondary | ICD-10-CM | POA: Insufficient documentation

## 2015-04-25 DIAGNOSIS — R0600 Dyspnea, unspecified: Secondary | ICD-10-CM | POA: Diagnosis not present

## 2015-04-25 HISTORY — DX: Ischemic cardiomyopathy: I25.5

## 2015-04-25 HISTORY — DX: Essential (primary) hypertension: I10

## 2015-04-25 LAB — CBC WITH DIFFERENTIAL/PLATELET
BASOS ABS: 0 10*3/uL (ref 0.0–0.1)
BASOS PCT: 0 %
EOS PCT: 0 %
Eosinophils Absolute: 0 10*3/uL (ref 0.0–0.7)
HEMATOCRIT: 37.5 % — AB (ref 39.0–52.0)
Hemoglobin: 13 g/dL (ref 13.0–17.0)
Lymphocytes Relative: 7 %
Lymphs Abs: 0.3 10*3/uL — ABNORMAL LOW (ref 0.7–4.0)
MCH: 32.5 pg (ref 26.0–34.0)
MCHC: 34.7 g/dL (ref 30.0–36.0)
MCV: 93.8 fL (ref 78.0–100.0)
MONO ABS: 0.1 10*3/uL (ref 0.1–1.0)
Monocytes Relative: 1 %
NEUTROS ABS: 3.8 10*3/uL (ref 1.7–7.7)
Neutrophils Relative %: 92 %
PLATELETS: 165 10*3/uL (ref 150–400)
RBC: 4 MIL/uL — AB (ref 4.22–5.81)
RDW: 13.2 % (ref 11.5–15.5)
WBC: 4.2 10*3/uL (ref 4.0–10.5)

## 2015-04-25 LAB — COMPREHENSIVE METABOLIC PANEL
ALBUMIN: 2.9 g/dL — AB (ref 3.5–5.0)
ALK PHOS: 248 U/L — AB (ref 38–126)
ALT: 63 U/L (ref 17–63)
ALT: 51 U/L (ref 17–63)
ANION GAP: 11 (ref 5–15)
ANION GAP: 13 (ref 5–15)
AST: 32 U/L (ref 15–41)
AST: 34 U/L (ref 15–41)
Albumin: 2.3 g/dL — ABNORMAL LOW (ref 3.5–5.0)
Alkaline Phosphatase: 190 U/L — ABNORMAL HIGH (ref 38–126)
BILIRUBIN TOTAL: 0.8 mg/dL (ref 0.3–1.2)
BUN: 16 mg/dL (ref 6–20)
BUN: 19 mg/dL (ref 6–20)
CALCIUM: 7.5 mg/dL — AB (ref 8.9–10.3)
CALCIUM: 8.5 mg/dL — AB (ref 8.9–10.3)
CHLORIDE: 108 mmol/L (ref 101–111)
CO2: 19 mmol/L — ABNORMAL LOW (ref 22–32)
CO2: 17 mmol/L — AB (ref 22–32)
Chloride: 103 mmol/L (ref 101–111)
Creatinine, Ser: 0.88 mg/dL (ref 0.61–1.24)
Creatinine, Ser: 1.07 mg/dL (ref 0.61–1.24)
GFR calc Af Amer: >60 mL/min (ref >60)
GFR calc non Af Amer: >60 mL/min (ref >60)
GLUCOSE: 125 mg/dL — AB (ref 65–99)
Glucose, Bld: 157 mg/dL — ABNORMAL HIGH (ref 65–99)
Potassium: 3 mmol/L — ABNORMAL LOW (ref 3.5–5.1)
Potassium: 2.8 mmol/L — ABNORMAL LOW (ref 3.5–5.1)
SODIUM: 136 mmol/L (ref 135–145)
Sodium: 135 mmol/L (ref 135–145)
TOTAL PROTEIN: 6.5 g/dL (ref 6.5–8.1)
Total Bilirubin: 1.4 mg/dL — ABNORMAL HIGH (ref 0.3–1.2)
Total Protein: 5.5 g/dL — ABNORMAL LOW (ref 6.5–8.1)

## 2015-04-25 LAB — LACTIC ACID, PLASMA
LACTIC ACID, VENOUS: 3.6 mmol/L — AB (ref 0.5–2.0)
LACTIC ACID, VENOUS: 2.9 mmol/L — AB (ref 0.5–2.0)

## 2015-04-25 LAB — I-STAT CHEM 8, ED
BUN: 18 mg/dL (ref 6–20)
CALCIUM ION: 1.1 mmol/L — AB (ref 1.13–1.30)
Chloride: 102 mmol/L (ref 101–111)
Creatinine, Ser: 0.8 mg/dL (ref 0.61–1.24)
Glucose, Bld: 123 mg/dL — ABNORMAL HIGH (ref 65–99)
HCT: 39 % (ref 39.0–52.0)
Hemoglobin: 13.3 g/dL (ref 13.0–17.0)
Potassium: 3 mmol/L — ABNORMAL LOW (ref 3.5–5.1)
SODIUM: 136 mmol/L (ref 135–145)
TCO2: 18 mmol/L (ref 0–100)

## 2015-04-25 LAB — PROTIME-INR
INR: 1.36 (ref 0.00–1.49)
Prothrombin Time: 16.9 seconds — ABNORMAL HIGH (ref 11.6–15.2)

## 2015-04-25 LAB — CBC
HCT: 33 % — ABNORMAL LOW (ref 39.0–52.0)
Hemoglobin: 11.5 g/dL — ABNORMAL LOW (ref 13.0–17.0)
MCH: 32.8 pg (ref 26.0–34.0)
MCHC: 34.8 g/dL (ref 30.0–36.0)
MCV: 94 fL (ref 78.0–100.0)
PLATELETS: 142 10*3/uL — AB (ref 150–400)
RBC: 3.51 MIL/uL — ABNORMAL LOW (ref 4.22–5.81)
RDW: 13.7 % (ref 11.5–15.5)
WBC: 23.7 10*3/uL — ABNORMAL HIGH (ref 4.0–10.5)

## 2015-04-25 LAB — I-STAT CG4 LACTIC ACID, ED
LACTIC ACID, VENOUS: 3.79 mmol/L — AB (ref 0.5–2.0)
Lactic Acid, Venous: 2.92 mmol/L (ref 0.5–2.0)
Lactic Acid, Venous: 3.19 mmol/L (ref 0.5–2.0)

## 2015-04-25 LAB — URINALYSIS, ROUTINE W REFLEX MICROSCOPIC
Bilirubin Urine: NEGATIVE
Glucose, UA: NEGATIVE mg/dL
Glucose, UA: NEGATIVE mg/dL
Ketones, ur: 15 mg/dL — AB
Ketones, ur: 15 mg/dL — AB
LEUKOCYTES UA: NEGATIVE
Leukocytes, UA: NEGATIVE
NITRITE: NEGATIVE
NITRITE: NEGATIVE
PROTEIN: 100 mg/dL — AB
PROTEIN: 100 mg/dL — AB
Specific Gravity, Urine: 1.022 (ref 1.005–1.030)
pH: 8 (ref 5.0–8.0)
pH: 6.5 (ref 5.0–8.0)

## 2015-04-25 LAB — LIPASE, BLOOD: LIPASE: 24 U/L (ref 11–51)

## 2015-04-25 LAB — MRSA PCR SCREENING: MRSA BY PCR: NEGATIVE

## 2015-04-25 LAB — ABO/RH: ABO/RH(D): A POS

## 2015-04-25 LAB — GLUCOSE, CAPILLARY: GLUCOSE-CAPILLARY: 134 mg/dL — AB (ref 65–99)

## 2015-04-25 LAB — PROCALCITONIN: PROCALCITONIN: 62 ng/mL

## 2015-04-25 LAB — URINE MICROSCOPIC-ADD ON
Bacteria, UA: NONE SEEN
SQUAMOUS EPITHELIAL / LPF: NONE SEEN
WBC UA: NONE SEEN WBC/hpf (ref 0–5)

## 2015-04-25 LAB — I-STAT TROPONIN, ED: TROPONIN I, POC: 0.01 ng/mL (ref 0.00–0.08)

## 2015-04-25 LAB — TYPE AND SCREEN
ABO/RH(D): A POS
Antibody Screen: NEGATIVE

## 2015-04-25 LAB — CORTISOL: CORTISOL PLASMA: 67.4 ug/dL

## 2015-04-25 LAB — APTT: aPTT: 42 seconds — ABNORMAL HIGH (ref 24–37)

## 2015-04-25 LAB — GRAM STAIN

## 2015-04-25 LAB — TROPONIN I
TROPONIN I: 0.2 ng/mL — AB (ref <0.031)
Troponin I: 0.08 ng/mL — ABNORMAL HIGH (ref <0.031)

## 2015-04-25 LAB — TSH: TSH: 0.529 u[IU]/mL (ref 0.350–4.500)

## 2015-04-25 MED ORDER — FENTANYL CITRATE (PF) 100 MCG/2ML IJ SOLN
INTRAMUSCULAR | Status: AC
  Administered 2015-04-25: 12.5 ug via INTRAVENOUS
  Filled 2015-04-25: qty 6

## 2015-04-25 MED ORDER — MEPERIDINE HCL 50 MG/ML IJ SOLN
INTRAMUSCULAR | Status: AC
  Administered 2015-04-25: 25 mg
  Filled 2015-04-25: qty 1

## 2015-04-25 MED ORDER — ONDANSETRON HCL 4 MG/2ML IJ SOLN
INTRAMUSCULAR | Status: AC | PRN
  Administered 2015-04-25: 4 mg via INTRAVENOUS

## 2015-04-25 MED ORDER — SODIUM CHLORIDE 0.9 % IV BOLUS (SEPSIS)
1000.0000 mL | INTRAVENOUS | Status: AC
  Administered 2015-04-25 (×2): 1000 mL via INTRAVENOUS

## 2015-04-25 MED ORDER — ASPIRIN 81 MG PO CHEW
324.0000 mg | CHEWABLE_TABLET | Freq: Once | ORAL | Status: AC
  Administered 2015-04-25: 324 mg via ORAL
  Filled 2015-04-25: qty 4

## 2015-04-25 MED ORDER — PANTOPRAZOLE SODIUM 40 MG IV SOLR
40.0000 mg | Freq: Every day | INTRAVENOUS | Status: DC
Start: 2015-04-25 — End: 2015-04-26
  Administered 2015-04-25: 40 mg via INTRAVENOUS
  Filled 2015-04-25 (×2): qty 40

## 2015-04-25 MED ORDER — NITROGLYCERIN 0.4 MG SL SUBL
0.4000 mg | SUBLINGUAL_TABLET | Freq: Once | SUBLINGUAL | Status: DC
  Filled 2015-04-25: qty 1

## 2015-04-25 MED ORDER — FLUCONAZOLE IN SODIUM CHLORIDE 400-0.9 MG/200ML-% IV SOLN
400.0000 mg | INTRAVENOUS | Status: DC
  Administered 2015-04-25: 400 mg via INTRAVENOUS
  Filled 2015-04-25: qty 200

## 2015-04-25 MED ORDER — HYDROCORTISONE NA SUCCINATE PF 100 MG IJ SOLR
50.0000 mg | Freq: Four times a day (QID) | INTRAMUSCULAR | Status: DC
  Administered 2015-04-25 – 2015-04-26 (×5): 50 mg via INTRAVENOUS
  Filled 2015-04-25 (×3): qty 2
  Filled 2015-04-25 (×3): qty 1
  Filled 2015-04-25: qty 2
  Filled 2015-04-25: qty 1
  Filled 2015-04-25: qty 2

## 2015-04-25 MED ORDER — PIPERACILLIN-TAZOBACTAM 3.375 G IVPB 30 MIN
3.3750 g | Freq: Once | INTRAVENOUS | Status: AC
  Administered 2015-04-25: 3.375 g via INTRAVENOUS
  Filled 2015-04-25: qty 50

## 2015-04-25 MED ORDER — SODIUM CHLORIDE 0.9 % IV SOLN
INTRAVENOUS | Status: DC
  Administered 2015-04-25 (×3): via INTRAVENOUS

## 2015-04-25 MED ORDER — SODIUM CHLORIDE 0.9 % IV BOLUS (SEPSIS)
1000.0000 mL | Freq: Once | INTRAVENOUS | Status: AC
  Administered 2015-04-25: 1000 mL via INTRAVENOUS

## 2015-04-25 MED ORDER — FENTANYL CITRATE (PF) 100 MCG/2ML IJ SOLN
12.5000 ug | INTRAMUSCULAR | Status: DC | PRN
  Administered 2015-04-25 – 2015-04-26 (×4): 12.5 ug via INTRAVENOUS
  Filled 2015-04-25 (×4): qty 2

## 2015-04-25 MED ORDER — SODIUM CHLORIDE 0.9 % IV SOLN: 500.0000 mL | INTRAVENOUS | Status: DC | PRN

## 2015-04-25 MED ORDER — IOHEXOL 300 MG/ML  SOLN
100.0000 mL | Freq: Once | INTRAMUSCULAR | Status: AC | PRN
  Administered 2015-04-25: 100 mL via INTRAVENOUS

## 2015-04-25 MED ORDER — LIDOCAINE HCL 1 % IJ SOLN
INTRAMUSCULAR | Status: AC
  Filled 2015-04-25: qty 20

## 2015-04-25 MED ORDER — SODIUM CHLORIDE 0.9 % IV BOLUS (SEPSIS)
500.0000 mL | INTRAVENOUS | Status: AC
  Administered 2015-04-25: 500 mL via INTRAVENOUS

## 2015-04-25 MED ORDER — CHLORHEXIDINE GLUCONATE 0.12 % MT SOLN
15.0000 mL | Freq: Two times a day (BID) | OROMUCOSAL | Status: DC
  Administered 2015-04-25 – 2015-05-01 (×13): 15 mL via OROMUCOSAL
  Filled 2015-04-25 (×12): qty 15

## 2015-04-25 MED ORDER — VANCOMYCIN HCL IN DEXTROSE 1-5 GM/200ML-% IV SOLN
1000.0000 mg | Freq: Three times a day (TID) | INTRAVENOUS | Status: DC
  Administered 2015-04-25: 1000 mg via INTRAVENOUS
  Filled 2015-04-25 (×2): qty 200

## 2015-04-25 MED ORDER — ONDANSETRON HCL 4 MG/2ML IJ SOLN
INTRAMUSCULAR | Status: AC
  Filled 2015-04-25: qty 2

## 2015-04-25 MED ORDER — IOHEXOL 300 MG/ML  SOLN
50.0000 mL | Freq: Once | INTRAMUSCULAR | Status: AC | PRN
Start: 2015-04-25 — End: 2015-04-25
  Administered 2015-04-25: 10 mL via INTRAVENOUS

## 2015-04-25 MED ORDER — NOREPINEPHRINE BITARTRATE 1 MG/ML IV SOLN
5.0000 ug/min | INTRAVENOUS | Status: DC
  Administered 2015-04-25: 25 ug/min via INTRAVENOUS
  Filled 2015-04-25 (×4): qty 4

## 2015-04-25 MED ORDER — DEXTROSE 5 % IV SOLN
0.0000 ug/min | Freq: Once | INTRAVENOUS | Status: AC
  Administered 2015-04-25: 5 ug/min via INTRAVENOUS

## 2015-04-25 MED ORDER — MIDAZOLAM HCL 2 MG/2ML IJ SOLN
INTRAMUSCULAR | Status: AC | PRN
  Administered 2015-04-25: 0.5 mg via INTRAVENOUS

## 2015-04-25 MED ORDER — FENTANYL CITRATE (PF) 100 MCG/2ML IJ SOLN
INTRAMUSCULAR | Status: AC | PRN
  Administered 2015-04-25: 25 ug via INTRAVENOUS

## 2015-04-25 MED ORDER — POTASSIUM CHLORIDE 10 MEQ/50ML IV SOLN
10.0000 meq | INTRAVENOUS | Status: AC
  Administered 2015-04-25 (×4): 10 meq via INTRAVENOUS
  Filled 2015-04-25 (×4): qty 50

## 2015-04-25 MED ORDER — ACETAMINOPHEN 325 MG PO TABS
650.0000 mg | ORAL_TABLET | Freq: Four times a day (QID) | ORAL | Status: DC | PRN
  Administered 2015-04-27: 650 mg via ORAL
  Filled 2015-04-25: qty 2

## 2015-04-25 MED ORDER — HEPARIN SODIUM (PORCINE) 5000 UNIT/ML IJ SOLN
5000.0000 [IU] | Freq: Three times a day (TID) | INTRAMUSCULAR | Status: DC
  Administered 2015-04-25 – 2015-04-27 (×7): 5000 [IU] via SUBCUTANEOUS
  Filled 2015-04-25 (×10): qty 1

## 2015-04-25 MED ORDER — MIDAZOLAM HCL 2 MG/2ML IJ SOLN
INTRAMUSCULAR | Status: AC
  Filled 2015-04-25: qty 4

## 2015-04-25 MED ORDER — CETYLPYRIDINIUM CHLORIDE 0.05 % MT LIQD
7.0000 mL | Freq: Two times a day (BID) | OROMUCOSAL | Status: DC
  Administered 2015-04-27 – 2015-04-30 (×3): 7 mL via OROMUCOSAL

## 2015-04-25 MED ORDER — PIPERACILLIN-TAZOBACTAM 3.375 G IVPB
3.3750 g | Freq: Three times a day (TID) | INTRAVENOUS | Status: DC
  Administered 2015-04-25 – 2015-04-28 (×9): 3.375 g via INTRAVENOUS
  Filled 2015-04-25 (×14): qty 50

## 2015-04-25 MED ORDER — SODIUM CHLORIDE 0.9 % IV SOLN: 250.0000 mL | INTRAVENOUS | Status: DC | PRN

## 2015-04-25 MED ORDER — ONDANSETRON HCL 4 MG/2ML IJ SOLN: 4.0000 mg | Freq: Four times a day (QID) | INTRAMUSCULAR | Status: DC | PRN

## 2015-04-25 NOTE — Progress Notes (Signed)
BC's positive for gram negative rods. Dr. Titus Mould notified.

## 2015-04-25 NOTE — ED Notes (Signed)
Family at bedside. 

## 2015-04-25 NOTE — ED Notes (Signed)
Pt transported to IR 

## 2015-04-25 NOTE — ED Notes (Signed)
ABT after pt returns to room.

## 2015-04-25 NOTE — ED Notes (Signed)
MD Titus Mould  at bedside.

## 2015-04-25 NOTE — Sedation Documentation (Signed)
Patient is resting comfortably. 

## 2015-04-25 NOTE — ED Notes (Addendum)
Pt wife called EMs due to fever. Pt 101.8 Tympanic, 120/87 BP, 114 CBG, 500 bolus with EMs. Sinus Tachy on the montior. 16G LFA 18G RFA. 100mg  of tylenol administered by EMS  Emesis x3 on Sunday. Some ABD pain.   102.9 Oral fever now.

## 2015-04-25 NOTE — ED Notes (Signed)
Spouse Danielle Lara [phone]

## 2015-04-25 NOTE — Progress Notes (Addendum)
ANTIBIOTIC CONSULT NOTE - INITIAL  Pharmacy Consult for Zosyn and Vancocin Indication: Intra-abdominal infection  No Known Allergies  Patient Measurements: Height: 5\' 11"  (180.3 cm) Weight: 185 lb (83.915 kg) IBW/kg (Calculated) : 75.3  Vital Signs: Temp: 102.9 F (39.4 C) (11/24 0042) Temp Source: Oral (11/24 0042) BP: 133/72 mmHg (11/24 0042) Pulse Rate: 136 (11/24 0042)  Labs:  Recent Labs  04/25/15 0045 04/25/15 0100  WBC 4.2  --   HGB 13.0 13.3  PLT 165  --   CREATININE  --  0.80   Estimated Creatinine Clearance: 98 mL/min (by C-G formula based on Cr of 0.8).   Assessment: 64 y/o M brought in by EMS for fever, WBC WNL, renal function good, lactic acid is elevated, other meds/labs reviewed. Starting anti-biotics for r/o intra-abdominal infection.   Plan:  -Zosyn 3.375G IV q8h to be infused over 4 hours -Trend WBC, temp, renal function  -F/U infectious work-up  Narda Bonds 04/25/2015,1:11 AM   ADDENDUM: Now to broaden coverage w/ vanc.  Will start vancomycin 1g IV Q8H for goal trough 15-20 and monitor CBC, Cx, levels prn. Wynona Neat, PharmD, BCPS 04/25/2015 7:19 AM

## 2015-04-25 NOTE — Consult Note (Signed)
Chief Complaint: Sepsis  Referring Physician(s): Dr. Betsey Holiday  History of Present Illness: Alexander [name] is a 64 y.o. male presenting to [facility] emergency department with abdominal pain and fever.  He was found during his emergency department visit to have evidence of acute cholecystitis. Hypotension, fever, and tachycardia indicate sepsis, with need for pharmacologic pressure support.  Imaging evidence of acute cholecystitis. General surgery has been consult to, with concerns for the patient's hemodynamic instability, and is requested vascular and interventional radiology to evaluate the patient.  Cholecystostomy tube is indicated.  Past Medical History  Diagnosis Date  . Arthritis   . History of depression   . Headache(784.0)   . CAD (coronary artery disease)     3 stents  . HTN (hypertension)   . HLD (hyperlipidemia)   . Migraines   . Neuropathy ([place])   . Post-polio syndrome   . Shoulder pain, right   . OSA (obstructive sleep apnea)     does not want to use CPAP, using mouth guard  . Chronic pain syndrome     [facility]    Past Surgical History  Procedure Laterality Date  . Coronary angioplasty with stent placement  1995, 2000, 2005  . Uvulopalatopharyngoplasty, tonsillectomy and septoplasty  2002  . Lumbar spine stimulator    . Cervical spine stimulator  08/2012  . Colonoscopy  09/2013    TA x2, rpt 5 yrs ([place])    Allergies: Review of patient's allergies indicates no known allergies.  Medications: Prior to Admission medications   Medication Sig Start Date End Date Taking? Authorizing Provider  aspirin EC 325 MG tablet Take 975 mg by mouth 2 (two) times daily.   Yes Historical Provider, MD  aspirin-acetaminophen-caffeine (EXCEDRIN MIGRAINE) [phone] MG per tablet Take 1-2 tablets by mouth every 8 (eight) hours as needed for headache. 12 daily 03/05/11  Yes [name], MD  atorvastatin (LIPITOR) 40 MG tablet TAKE 1 TABLET DAILY AT 6 P.M.  08/03/14  Yes [name], MD  Cholecalciferol ([employer] VITAMIN D) 2000 UNITS CAPS Take 1 capsule (2,000 Units total) by mouth daily. 03/29/13  Yes [name], MD  gabapentin (NEURONTIN) 600 MG tablet TAKE 2 TABLETS THREE TIMES A DAY 03/11/15  Yes [name], MD  ibuprofen (ADVIL,MOTRIN) 200 MG tablet Take 400 mg by mouth every 6 (six) hours as needed for moderate pain.    Yes Historical Provider, MD  lisinopril (PRINIVIL,ZESTRIL) 10 MG tablet TAKE 1 TABLET DAILY 06/12/14  Yes [name], MD  methocarbamol (ROBAXIN) 500 MG tablet TAKE 1 TABLET THREE TIMES A DAY AS NEEDED FOR MUSCLE SPASM 10/04/14  Yes [name], MD  Multiple Vitamins-Minerals (MULTIVITAMIN ADULTS 50+ PO) Take 1 tablet by mouth daily.   Yes Historical Provider, MD  Omega-3 Fatty Acids (FISH OIL) 1000 MG CAPS Take 1 capsule by mouth daily.   Yes Historical Provider, MD  omeprazole (PRILOSEC) 40 MG capsule TAKE 1 CAPSULE DAILY 11/26/14  Yes [name], MD  sildenafil (VIAGRA) 100 MG tablet Take 0.5-1 tablets (50-100 mg total) by mouth daily as needed for erectile dysfunction. 01/03/15  Yes [name], MD  vitamin C (ASCORBIC ACID) 500 MG tablet Take 1,000 mg by mouth daily.   Yes Historical Provider, MD  vitamin E 400 UNIT capsule Take 400 Units by mouth daily.   Yes Historical Provider, MD     Family History  Problem Relation Age of Onset  . Diabetes Mother   . Cancer Sister 64    male  cancer  . Coronary artery disease Neg Hx   . Stroke Neg Hx   . Colon cancer Neg Hx   . Pancreatic cancer Neg Hx   . Stomach cancer Neg Hx   . Cancer Brother 6    breast    Social History   Social History  . Marital Status: Married    Spouse Name: N/A  . Number of Children: N/A  . Years of Education: N/A   Social History Main Topics  . Smoking status: Never Smoker   . Smokeless tobacco: Never Used  . Alcohol Use: No  . Drug Use: No  . Sexual Activity: Not Asked   Other Topics Concern  . 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  All other systems are negative.  Review of Systems  Vital Signs: BP 89/64 mmHg  Pulse 113  Temp(Src) 98.7 F (37.1 C) (Oral)  Resp 29  Ht 5\' 11"  (1.803 m)  Wt 185 lb (83.915 kg)  BMI 25.81 kg/m2  SpO2 97%  Physical Exam  Mallampati Score:     Imaging: Dg Chest 2 View  04/25/2015  CLINICAL DATA:  Initial evaluation for possible sepsis. EXAM: CHEST  2 VIEW COMPARISON:  None. FINDINGS: Examination is somewhat limited by technique and lordotic angulation. Cardiac and mediastinal silhouettes grossly within normal limits. Spinal stimulator electrodes noted. Lungs are hypoinflated. Mild right basilar atelectasis. There is diffuse peribronchial thickening with mild prominence of the interstitial markings, greatest within the left mid and lower lung. Finding is nonspecific, but may reflect sequela of bronchiolitis or possible atypical/interstitial pneumonia. No consolidative airspace opacity. No pulmonary edema or pleural effusion. No pneumothorax. No acute osseus abnormality. IMPRESSION: Diffuse peribronchial thickening with prominence of the interstitial markings, greatest within the mid and lower left lung. Finding is nonspecific but may reflect sequela of bronchiolitis or possible atypical/ interstitial pneumonia. No consolidative airspace disease. Electronically Signed   By: Jeannine Boga M.D.   On: 04/25/2015 01:40   Ct Abdomen Pelvis W Contrast  04/25/2015  CLINICAL DATA:  Low abdominal pain and low chest pain since yesterday. Constipation on Tuesday and  Wednesday. Fever. EXAM: CT ABDOMEN AND PELVIS WITH CONTRAST TECHNIQUE: Multidetector CT imaging of the abdomen and pelvis was performed using the standard protocol following bolus administration of intravenous contrast. CONTRAST:  113mL OMNIPAQUE IOHEXOL 300 MG/ML  SOLN COMPARISON:  None. FINDINGS: Interstitial fibrosis in the lung bases with ground-glass alveolitis particularly on the right. Calcified granulomas in the lung bases. Coronary artery calcifications. The gallbladder is distended. No stones are identified but there is evidence of wall thickening and edema with mild inflammatory reaction around the gallbladder. Mild intra and extrahepatic bile duct dilatation. No discrete stone or mass identified. There is infiltration around the distal aspect of the stomach and duodenal bulb region and around the head of the pancreas. This is nonspecific and could represent inflammatory change due to peptic ulcer disease, cholecystitis, or focal pancreatitis. The spleen, inferior vena cava, and retroperitoneal lymph nodes are unremarkable. Calcification of the aorta without aneurysm. Subcentimeter cysts  in the right kidney. No hydronephrosis in the kidneys. Nodule in the left adrenal gland measures 18 mm diameter. Hounsfield unit measurements are indeterminate but statistically this most likely represents benign adenoma. Consider followup in 1 year with noncontrast CT. Stomach, small bowel, and colon are mostly decompressed. No free air or free fluid in the abdomen. Pelvis: Prostate gland is mildly enlarged. Bladder wall is not thickened. No free or loculated pelvic fluid collections. No pelvic mass or lymphadenopathy. Rectosigmoid colon is unremarkable. Appendix is not identified. Degenerative changes in the spine. Spondylolysis with mild spondylolisthesis at L5-S1. No destructive bone lesions. Spinal stimulator device. IMPRESSION: Inflammatory process in the right upper quadrant with distended thick-walled gallbladder,  bile duct dilatation, and infiltration in the adjacent fat. Etiology is nonspecific in could indicate cholecystitis, focal pancreatitis, or peptic ulcer disease. No evidence of bowel perforation or obstruction. Interstitial fibrosis and inflammatory changes in the lung bases. Electronically Signed   By: Lucienne Capers M.D.   On: 04/25/2015 02:44   Dg Chest Portable 1 View  04/25/2015  CLINICAL DATA:  Central line placement.  Initial encounter. EXAM: PORTABLE CHEST 1 VIEW COMPARISON:  Chest radiograph performed earlier today at 1:09 a.m. FINDINGS: The right IJ line is noted ending about the mid SVC. The lungs are hypoexpanded. Vascular crowding and vascular congestion are noted. Patchy bilateral opacities may reflect atelectasis or possibly mild pneumonia. No definite pleural effusion or pneumothorax is seen. The cardiomediastinal silhouette is normal in size. No acute osseous abnormalities are seen. Spinal stimulation leads are partially imaged. IMPRESSION: 1. Right IJ line noted ending about the mid SVC. 2. Lungs hypoexpanded. Vascular congestion noted. Patchy bilateral airspace opacities may reflect atelectasis or possibly mild pneumonia, more prominent than on the prior study. Electronically Signed   By: Garald Balding M.D.   On: 04/25/2015 07:11    Labs:  CBC:  Recent Labs  04/25/15 0045 04/25/15 0100  WBC 4.2  --   HGB 13.0 13.3  HCT 37.5* 39.0  PLT 165  --     COAGS: No results for input(s): INR, APTT in the last 8760 hours.  BMP:  Recent Labs  07/05/14 0847 04/25/15 0045 04/25/15 0100  NA 139 135 136  K 4.1 3.0* 3.0*  CL 103 103 102  CO2 25 19*  --   GLUCOSE 99 125* 123*  BUN 19 16 18   CALCIUM 9.6 8.5*  --   CREATININE 0.82 0.88 0.80  GFRNONAA  --  >60  --   GFRAA  --  >60  --     LIVER FUNCTION TESTS:  Recent Labs  07/05/14 0847 04/25/15 0045  BILITOT 0.4 0.8  AST 28 32  ALT 40 63  ALKPHOS 56 248*  PROT 7.2 6.5  ALBUMIN 4.4 2.9*    TUMOR MARKERS: No  results for input(s): AFPTM, CEA, CA199, CHROMGRNA in the last 8760 hours.  Assessment and Plan:  64 year old gentleman with acute sepsis secondary to acute cholecystitis with hypotension, current pharmacologic pressure support, and pending blood cultures. He has been started on fluid hydration and antibiotics.  Percutaneous cholecystostomy tube is indicated.  The patient does not take anticoagulation and has no history of liver disease. He has a history of prior coronary stents, though there have been none placed recently and he is not taking any antiplatelet medicines.  We will proceed with percutaneous cholecystostomy.  I've spoken to the patient and his family regarding risks and benefits. Specific risks include bleeding, infection, worsening sepsis and hemodynamic collapse  with cardiopulmonary compromise and potentially death. Additionally we have spoken regarding the length of 2 placement, and long-term drainage if he is not a surgical candidate for interval cholecystectomy.  I answered all their questions and they understand the plan of care.  Thank you for this interesting consult.  I greatly enjoyed meeting Bradly Bozzuto and look forward to participating in their care.  A copy of this report was sent to the requesting provider on this date.  SignedCorrie Mckusick 04/25/2015, 9:16 AM

## 2015-04-25 NOTE — H&P (Addendum)
PULMONARY / CRITICAL CARE MEDICINE   Name: Alexander [name] MRN: [id number] DOB: May 16, 1951    ADMISSION DATE:  04/25/2015 CONSULTATION DATE:  04/25/15  REFERRING MD :  Dr. Betsey Holiday   CHIEF COMPLAINT:  Sepsis   HISTORY OF PRESENT ILLNESS:   64 y/o M with PMH of depression, arthritis, headaches, CAD s/p stent, HTN, HLD, post-polio syndrome, OSA, chronic pain, cervical spine stimulator who presented to [facility] on 11/24 via EMS with reports of fever to 101.8, abdominal pain, constipation & nausea / vomiting.    The patient reported symptoms began 48 hours prior to admit.  Initially, he thought it was related to ingestion of "bad salsa" but no other family members became ill.  He became cool and clammy at home prompting evaluation.  On arrival, he was tachycardic but not hypotensive.  While in the ER, he developed chest pain with mild elevation in troponin (0.08).  EKG without evidence of ischemia.  CT of the abdomen / pelvis was completed which showed an inflammatory process in the RUQ with distended thick-walled gallbladder, bile duct dilation and infiltration in the adjacent fat.    At baseline, the patient walks with a walker (post-polio).  Otherwise, is independent of ADL's.   ER work up notable for CT abd / pelvis as above, Na 136, K 3.0, Cl 102, glucose 123, troponin 0.08, lactic acid 3.79.  The patient was volume resuscitated in the ER, cultured and empiric antibiotics were initiated.  He developed hypotension and central line was placed per EDP.  Levophed was initiated to maintain MAP.   PCCM consulted for ICU admission.   PAST MEDICAL HISTORY :  He  has a past medical history of Arthritis; History of depression; Headache(784.0); CAD (coronary artery disease); HTN (hypertension); HLD (hyperlipidemia); Migraines; Neuropathy ([place]); Post-polio syndrome; Shoulder pain, right; OSA (obstructive sleep apnea); and Chronic pain syndrome.  PAST SURGICAL HISTORY: He  has past surgical history that  includes Coronary angioplasty with stent (1995, 2000, 2005); Uvulopalatopharyngoplasty, tonsillectomy and septoplasty (2002); lumbar spine stimulator; cervical spine stimulator (08/2012); and Colonoscopy (09/2013).  No Known Allergies  No current facility-administered medications on file prior to encounter.   Current Outpatient Prescriptions on File Prior to Encounter  Medication Sig  . aspirin-acetaminophen-caffeine (EXCEDRIN MIGRAINE) 250-250-65 MG per tablet Take 1-2 tablets by mouth every 8 (eight) hours as needed for headache. 12 daily  . atorvastatin (LIPITOR) 40 MG tablet TAKE 1 TABLET DAILY AT 6 P.M.  . Cholecalciferol ([employer] VITAMIN D) 2000 UNITS CAPS Take 1 capsule (2,000 Units total) by mouth daily.  Marland Kitchen gabapentin (NEURONTIN) 600 MG tablet TAKE 2 TABLETS THREE TIMES A DAY  . ibuprofen (ADVIL,MOTRIN) 200 MG tablet Take 400 mg by mouth every 6 (six) hours as needed for moderate pain.   Marland Kitchen lisinopril (PRINIVIL,ZESTRIL) 10 MG tablet TAKE 1 TABLET DAILY  . methocarbamol (ROBAXIN) 500 MG tablet TAKE 1 TABLET THREE TIMES A DAY AS NEEDED FOR MUSCLE SPASM  . Multiple Vitamins-Minerals (MULTIVITAMIN ADULTS 50+ PO) Take 1 tablet by mouth daily.  . Omega-3 Fatty Acids (FISH OIL) 1000 MG CAPS Take 1 capsule by mouth daily.  Marland Kitchen omeprazole (PRILOSEC) 40 MG capsule TAKE 1 CAPSULE DAILY  . sildenafil (VIAGRA) 100 MG tablet Take 0.5-1 tablets (50-100 mg total) by mouth daily as needed for erectile dysfunction.    FAMILY HISTORY:  His has no family status information on file.   SOCIAL HISTORY: He  reports that he has never smoked. He has never used smokeless tobacco. He reports that  he does not drink alcohol or use illicit drugs.  REVIEW OF SYSTEMS:   Gen: Denies  weight change, fatigue, night sweats. Reports fever, chills HEENT: Denies blurred vision, double vision, hearing loss, tinnitus, sinus congestion, rhinorrhea, sore throat, neck stiffness, dysphagia PULM: Denies shortness of breath, cough,  sputum production, hemoptysis, wheezing CV: Denies chest pain, edema, orthopnea, paroxysmal nocturnal dyspnea, palpitations GI: Denies diarrhea, hematochezia, melena, constipation, change in bowel habits.  Reports abdominal pain, nausea, vomiting, constipation GU: Denies dysuria, hematuria, polyuria, oliguria, urethral discharge Endocrine: Denies hot or cold intolerance, polyuria, polyphagia or appetite change Derm: Denies rash, dry skin, scaling or peeling skin change Heme: Denies easy bruising, bleeding, bleeding gums Neuro: Denies headache, numbness, weakness, slurred speech, loss of memory or consciousness   SUBJECTIVE:   VITAL SIGNS: BP 75/60 mmHg  Pulse 115  Temp(Src) 102.9 F (39.4 C) (Oral)  Resp 31  Ht 5\' 11"  (1.803 m)  Wt 185 lb (83.915 kg)  BMI 25.81 kg/m2  SpO2 95%  HEMODYNAMICS:    VENTILATOR SETTINGS:    INTAKE / OUTPUT:    PHYSICAL EXAMINATION: General:  wdwn adult male in NAD  Neuro:  AAOx4, MAE, intact HEENT:  MM pink/dry, no jvd Cardiovascular:  s1s2 rrr, no m/r/g, tachy Lungs:  resp's even/non-labored, lungs bilaterally clear  Abdomen:  Non-distended, bsx4 active, diffuse tenderness but increased in RUQ, guarding on exam, + rebound tenderness Musculoskeletal:  No acute deformities, atrophy to BLE (post-polio) Skin:  Warm/dry, no edema, rashes or lesions   Sepsis - Repeat Assessment  Performed at:    0730  Vitals     Blood pressure 88/64, pulse 115, temperature 98.7 F (37.1 C), temperature source Oral, resp. rate 30, height 5\' 11"  (1.803 m), weight 185 lb (83.915 kg), SpO2 95 %.  Heart:     Tachycardic  Lungs:    CTA  Capillary Refill:   <2 sec  Peripheral Pulse:   Radial pulse palpable  Skin:     Normal Color and Dry     LABS:  CBC  Recent Labs Lab 04/25/15 0045 04/25/15 0100  WBC 4.2  --   HGB 13.0 13.3  HCT 37.5* 39.0  PLT 165  --    Coag's No results for input(s): APTT, INR in the last 168 hours.   BMET  Recent  Labs Lab 04/25/15 0045 04/25/15 0100  NA 135 136  K 3.0* 3.0*  CL 103 102  CO2 19*  --   BUN 16 18  CREATININE 0.88 0.80  GLUCOSE 125* 123*   Electrolytes  Recent Labs Lab 04/25/15 0045  CALCIUM 8.5*   Sepsis Markers  Recent Labs Lab 04/25/15 0100 04/25/15 0325 04/25/15 0547  LATICACIDVEN 3.79* 2.92* 3.19*   ABG No results for input(s): PHART, PCO2ART, PO2ART in the last 168 hours.   Liver Enzymes  Recent Labs Lab 04/25/15 0045  AST 32  ALT 63  ALKPHOS 248*  BILITOT 0.8  ALBUMIN 2.9*   Cardiac Enzymes  Recent Labs Lab 04/25/15 0045  TROPONINI 0.08*   Glucose No results for input(s): GLUCAP in the last 168 hours.  Imaging Dg Chest 2 View  04/25/2015  CLINICAL DATA:  Initial evaluation for possible sepsis. EXAM: CHEST  2 VIEW COMPARISON:  None. FINDINGS: Examination is somewhat limited by technique and lordotic angulation. Cardiac and mediastinal silhouettes grossly within normal limits. Spinal stimulator electrodes noted. Lungs are hypoinflated. Mild right basilar atelectasis. There is diffuse peribronchial thickening with mild prominence of the interstitial markings, greatest within the  left mid and lower lung. Finding is nonspecific, but may reflect sequela of bronchiolitis or possible atypical/interstitial pneumonia. No consolidative airspace opacity. No pulmonary edema or pleural effusion. No pneumothorax. No acute osseus abnormality. IMPRESSION: Diffuse peribronchial thickening with prominence of the interstitial markings, greatest within the mid and lower left lung. Finding is nonspecific but may reflect sequela of bronchiolitis or possible atypical/ interstitial pneumonia. No consolidative airspace disease. Electronically Signed   By: Jeannine Boga M.D.   On: 04/25/2015 01:40   Ct Abdomen Pelvis W Contrast  04/25/2015  CLINICAL DATA:  Low abdominal pain and low chest pain since yesterday. Constipation on Tuesday and Wednesday. Fever. EXAM: CT  ABDOMEN AND PELVIS WITH CONTRAST TECHNIQUE: Multidetector CT imaging of the abdomen and pelvis was performed using the standard protocol following bolus administration of intravenous contrast. CONTRAST:  169mL OMNIPAQUE IOHEXOL 300 MG/ML  SOLN COMPARISON:  None. FINDINGS: Interstitial fibrosis in the lung bases with ground-glass alveolitis particularly on the right. Calcified granulomas in the lung bases. Coronary artery calcifications. The gallbladder is distended. No stones are identified but there is evidence of wall thickening and edema with mild inflammatory reaction around the gallbladder. Mild intra and extrahepatic bile duct dilatation. No discrete stone or mass identified. There is infiltration around the distal aspect of the stomach and duodenal bulb region and around the head of the pancreas. This is nonspecific and could represent inflammatory change due to peptic ulcer disease, cholecystitis, or focal pancreatitis. The spleen, inferior vena cava, and retroperitoneal lymph nodes are unremarkable. Calcification of the aorta without aneurysm. Subcentimeter cysts in the right kidney. No hydronephrosis in the kidneys. Nodule in the left adrenal gland measures 18 mm diameter. Hounsfield unit measurements are indeterminate but statistically this most likely represents benign adenoma. Consider followup in 1 year with noncontrast CT. Stomach, small bowel, and colon are mostly decompressed. No free air or free fluid in the abdomen. Pelvis: Prostate gland is mildly enlarged. Bladder wall is not thickened. No free or loculated pelvic fluid collections. No pelvic mass or lymphadenopathy. Rectosigmoid colon is unremarkable. Appendix is not identified. Degenerative changes in the spine. Spondylolysis with mild spondylolisthesis at L5-S1. No destructive bone lesions. Spinal stimulator device. IMPRESSION: Inflammatory process in the right upper quadrant with distended thick-walled gallbladder, bile duct dilatation, and  infiltration in the adjacent fat. Etiology is nonspecific in could indicate cholecystitis, focal pancreatitis, or peptic ulcer disease. No evidence of bowel perforation or obstruction. Interstitial fibrosis and inflammatory changes in the lung bases. Electronically Signed   By: Lucienne Capers M.D.   On: 04/25/2015 02:44   Dg Chest Portable 1 View  04/25/2015  CLINICAL DATA:  Central line placement.  Initial encounter. EXAM: PORTABLE CHEST 1 VIEW COMPARISON:  Chest radiograph performed earlier today at 1:09 a.m. FINDINGS: The right IJ line is noted ending about the mid SVC. The lungs are hypoexpanded. Vascular crowding and vascular congestion are noted. Patchy bilateral opacities may reflect atelectasis or possibly mild pneumonia. No definite pleural effusion or pneumothorax is seen. The cardiomediastinal silhouette is normal in size. No acute osseous abnormalities are seen. Spinal stimulation leads are partially imaged. IMPRESSION: 1. Right IJ line noted ending about the mid SVC. 2. Lungs hypoexpanded. Vascular congestion noted. Patchy bilateral airspace opacities may reflect atelectasis or possibly mild pneumonia, more prominent than on the prior study. Electronically Signed   By: Garald Balding M.D.   On: 04/25/2015 07:11     STUDIES:  11/24  CT ABD/Pelvis >> inflammatory process in the RUQ  with distended thick-walled gallbladder, bile duct dilation and infiltration in the adjacent fat.  No evidence of bowel perforation or obstruction. Fibrosis / inflammatory changes in lung bases.    CULTURES: 11/24  BCx2 >>  11/24  UA >>  11/24  UC >>   ANTIBIOTICS: Vanco 11/24 >>  Zosyn 11/24 >>  Diflucan 11/24 >>   SIGNIFICANT EVENTS: 11/24  Admit with n/v, abd pain  LINES/TUBES: R IJ TLC 11/24 >>   DISCUSSION: 64 y/o M admitted 11/24 with nausea / vomiting & abdominal pain.  CT abd/pelvis concerning for an inflammatory process with a distended thick-walled gallbladder.  Developed septic shock  while in ER requiring levophed.    ASSESSMENT / PLAN:  PULMONARY A: At Risk Atelectasis  Post-Polio Syndrome Hx OSA - does not use CPAP, has mouth guard P:   Monitor in ICU Oxygen as needed to support sats > 92% Intermittent CXR   CARDIOVASCULAR A:  Septic Shock  Elevated Troponin - r/o ACS, suspect in setting of demand ischemia with sepsis  Hx CAD s/p stent, HTN, HLD P:  ICU hemodynamic monitoring  Trend troponin  Levophed for MAP > 65 Assess CVP Q4 Solu-cortef 50 mg IV Q6 See ID  Hold home lisinopril, lipitor  RENAL A:   Lactic Acidosis  Hypokalemia  At Risk AKI - in setting of sepsis / volume depletion with n/v P:   Trend BMP / UOP  NS @ 75 ml/hr Replace electrolytes as indicated  Trend lactic acid   GASTROINTESTINAL A:   Nausea / Vomiting  P:   NPO  PRN zofran CCS consulted for evaluation   HEMATOLOGIC A:   No acute issues  P:  Trend CBC  Heparin for DVT prophylaxis   INFECTIOUS A:   Fever  Nausea / Vomiting / ABD Pain  Septic Shock  P:   Follow cultures as above Abdominal coverage with zosyn / diflucan CCS consulted  PRN tylenol for fever / pain   ENDOCRINE A:   Hyperglycemia   P:   Monitor glucose on BMP  Assess TSH   NEUROLOGIC A:   Chronic Pain  Post-Polio  Migraines  Depression  P:   RASS goal: n/a  Monitor neuro status Hold home neurontin, robaxin 11/24, consider restart in am pending decision regarding surgery etc   FAMILY  - Updates: Updated at bedside per Dr. Titus Mould   - Inter-disciplinary family meet or Palliative Care meeting due by: 12/1   Noe Gens, NP-C [facility] Pgr: [phone] or if no answer [phone] 04/25/2015, 7:18 AM   STAFF NOTE: I, Merrie Roof, MD FACP have personally reviewed patient's available data, including medical history, events of note, physical examination and test results as part of my evaluation. I have discussed with resident/NP and other care providers  such as pharmacist, RN and RRT. In addition, I personally evaluated patient and elicited key findings of: no resp distress,lung clear, abdomen soft but pain ruq and mid abdo, vol guard, rebound?, septic shock, received 30 cc/kg, continued volume, LActic clearing, see re assessment examination above with physical exam, add vanc stat to zosyn, consider empiric antifungals for now, [facility] sent, I called gen surgery , appreciate there recommendations, likely gallbladder is source here, distended, thickened, cvp goal 10-12, to icu, ensure K supp was given, may need a line, has cad, hold asa as likely will require surgical intervention, i personally updated pt and wife and family, follow sepsis bundles The patient is critically ill with multiple organ  systems failure and requires high complexity decision making for assessment and support, frequent evaluation and titration of therapies, application of advanced monitoring technologies and extensive interpretation of multiple databases.   Critical Care Time devoted to patient care services described in this note is35 Minutes. This time reflects time of care of this signee: Merrie Roof, MD FACP. This critical care time does not reflect procedure time, or teaching time or supervisory time of PA/NP/Med student/Med Resident etc but could involve care discussion time. Rest per NP/medical resident whose note is outlined above and that I agree with  Sepsis - Repeat Assessment  Performed at:    730 am on 04/25/15  Vitals     Blood pressure 109/80, pulse 94, temperature 98.6 F (37 C), temperature source Oral, resp. rate 30, height 5\' 11"  (1.803 m), weight 83.915 kg (185 lb), SpO2 95 %.  Heart:     Regular rate and rhythm  Lungs:    CTA  Capillary Refill:   <2 sec  Peripheral Pulse:   Radial pulse palpable  Skin:     Normal Color   Lavon Paganini. Titus Mould, MD, [place] Pgr: [facility] 04/25/2015 8:31 AM

## 2015-04-25 NOTE — Sedation Documentation (Signed)
Patient denies pain and is resting comfortably.  

## 2015-04-25 NOTE — Consult Note (Signed)
Reason for Consult: sepsis, GB concern Referring Physician: ER  Alexander [name] is an 64 y.o. male.  HPI: 64 yo male with 3 days abdominal pain, initially thought to be gastroenteritis, worsened yesterday and presented lastnight, however since then became hypotensive and is now on pressors. No previous similar episodes, no previous abdominal surgeries  Past Medical History  Diagnosis Date  . Arthritis   . History of depression   . Headache(784.0)   . CAD (coronary artery disease)     3 stents  . HTN (hypertension)   . HLD (hyperlipidemia)   . Migraines   . Neuropathy ([place])   . Post-polio syndrome   . Shoulder pain, right   . OSA (obstructive sleep apnea)     does not want to use CPAP, using mouth guard  . Chronic pain syndrome     [facility]    Past Surgical History  Procedure Laterality Date  . Coronary angioplasty with stent placement  1995, 2000, 2005  . Uvulopalatopharyngoplasty, tonsillectomy and septoplasty  2002  . Lumbar spine stimulator    . Cervical spine stimulator  08/2012  . Colonoscopy  09/2013    TA x2, rpt 5 yrs ([facility])    Family History  Problem Relation Age of Onset  . Diabetes Mother   . Cancer Sister 77    male cancer  . Coronary artery disease Neg Hx   . Stroke Neg Hx   . Colon cancer Neg Hx   . Pancreatic cancer Neg Hx   . Stomach cancer Neg Hx   . Cancer Brother 47    breast    Social History:  reports that he has never smoked. He has never used smokeless tobacco. He reports that he does not drink alcohol or use illicit drugs.  Allergies: No Known Allergies  Medications: I have reviewed the patient's current medications.  Results for orders placed or performed during the hospital encounter of 04/25/15 (from the past 48 hour(s))  Comprehensive metabolic panel     Status: Abnormal   Collection Time: 04/25/15 12:45 AM  Result Value Ref Range   Sodium 135 135 - 145 mmol/L   Potassium 3.0 (L) 3.5 - 5.1 mmol/L   Chloride 103 101 - 111  mmol/L   CO2 19 (L) 22 - 32 mmol/L   Glucose, Bld 125 (H) 65 - 99 mg/dL   BUN 16 6 - 20 mg/dL   Creatinine, Ser 0.88 0.61 - 1.24 mg/dL   Calcium 8.5 (L) 8.9 - 10.3 mg/dL   Total Protein 6.5 6.5 - 8.1 g/dL   Albumin 2.9 (L) 3.5 - 5.0 g/dL   AST 32 15 - 41 U/L   ALT 63 17 - 63 U/L   Alkaline Phosphatase 248 (H) 38 - 126 U/L   Total Bilirubin 0.8 0.3 - 1.2 mg/dL   GFR calc non Af Amer >60 >60 mL/min   GFR calc Af Amer >60 >60 mL/min    Comment: (NOTE) The eGFR has been calculated using the CKD EPI equation. This calculation has not been validated in all clinical situations. eGFR's persistently <60 mL/min signify possible Chronic Kidney Disease.    Anion gap 13 5 - 15  CBC with Differential     Status: Abnormal   Collection Time: 04/25/15 12:45 AM  Result Value Ref Range   WBC 4.2 4.0 - 10.5 K/uL   RBC 4.00 (L) 4.22 - 5.81 MIL/uL   Hemoglobin 13.0 13.0 - 17.0 g/dL   HCT 37.5 (L) 39.0 -  52.0 %   MCV 93.8 78.0 - 100.0 fL   MCH 32.5 26.0 - 34.0 pg   MCHC 34.7 30.0 - 36.0 g/dL   RDW 13.2 11.5 - 15.5 %   Platelets 165 150 - 400 K/uL   Neutrophils Relative % 92 %   Neutro Abs 3.8 1.7 - 7.7 K/uL   Lymphocytes Relative 7 %   Lymphs Abs 0.3 (L) 0.7 - 4.0 K/uL   Monocytes Relative 1 %   Monocytes Absolute 0.1 0.1 - 1.0 K/uL   Eosinophils Relative 0 %   Eosinophils Absolute 0.0 0.0 - 0.7 K/uL   Basophils Relative 0 %   Basophils Absolute 0.0 0.0 - 0.1 K/uL  Lipase, blood     Status: None   Collection Time: 04/25/15 12:45 AM  Result Value Ref Range   Lipase 24 11 - 51 U/L  Troponin I     Status: Abnormal   Collection Time: 04/25/15 12:45 AM  Result Value Ref Range   Troponin I 0.08 (H) <0.031 ng/mL    Comment:        PERSISTENTLY INCREASED TROPONIN VALUES IN THE RANGE OF 0.04-0.49 ng/mL CAN BE SEEN IN:       -UNSTABLE ANGINA       -CONGESTIVE HEART FAILURE       -MYOCARDITIS       -CHEST TRAUMA       -ARRYHTHMIAS       -LATE PRESENTING MYOCARDIAL INFARCTION       -COPD    CLINICAL FOLLOW-UP RECOMMENDED.   I-Stat CG4 Lactic Acid, ED (Not at [facility])     Status: Abnormal   Collection Time: 04/25/15  1:00 AM  Result Value Ref Range   Lactic Acid, Venous 3.79 (HH) 0.5 - 2.0 mmol/L   Comment NOTIFIED PHYSICIAN   I-stat chem 8, ed     Status: Abnormal   Collection Time: 04/25/15  1:00 AM  Result Value Ref Range   Sodium 136 135 - 145 mmol/L   Potassium 3.0 (L) 3.5 - 5.1 mmol/L   Chloride 102 101 - 111 mmol/L   BUN 18 6 - 20 mg/dL   Creatinine, Ser 0.80 0.61 - 1.24 mg/dL   Glucose, Bld 123 (H) 65 - 99 mg/dL   Calcium, Ion 1.10 (L) 1.13 - 1.30 mmol/L   TCO2 18 0 - 100 mmol/L   Hemoglobin 13.3 13.0 - 17.0 g/dL   HCT 39.0 39.0 - 52.0 %  Urinalysis, Routine w reflex microscopic (not at [facility])     Status: Abnormal   Collection Time: 04/25/15  1:03 AM  Result Value Ref Range   Color, Urine AMBER (A) YELLOW    Comment: BIOCHEMICALS MAY BE AFFECTED BY COLOR   APPearance CLEAR CLEAR   Specific Gravity, Urine 1.022 1.005 - 1.030   pH 8.0 5.0 - 8.0   Glucose, UA NEGATIVE NEGATIVE mg/dL   Hgb urine dipstick SMALL (A) NEGATIVE   Bilirubin Urine NEGATIVE NEGATIVE   Ketones, ur 15 (A) NEGATIVE mg/dL   Protein, ur 100 (A) NEGATIVE mg/dL   Nitrite NEGATIVE NEGATIVE   Leukocytes, UA NEGATIVE NEGATIVE  Urine microscopic-add on     Status: None   Collection Time: 04/25/15  1:03 AM  Result Value Ref Range   Squamous Epithelial / LPF NONE SEEN NONE SEEN    Comment: Please note change in reference range.   WBC, UA NONE SEEN 0 - 5 WBC/hpf    Comment: Please note change in reference range.   RBC /  HPF 6-30 0 - 5 RBC/hpf    Comment: Please note change in reference range.   Bacteria, UA NONE SEEN NONE SEEN    Comment: Please note change in reference range.  I-Stat CG4 Lactic Acid, ED     Status: Abnormal   Collection Time: 04/25/15  3:25 AM  Result Value Ref Range   Lactic Acid, Venous 2.92 (HH) 0.5 - 2.0 mmol/L   Comment NOTIFIED PHYSICIAN   I-stat troponin, ED      Status: None   Collection Time: 04/25/15  5:31 AM  Result Value Ref Range   Troponin i, poc 0.01 0.00 - 0.08 ng/mL   Comment 3            Comment: Due to the release kinetics of cTnI, a negative result within the first hours of the onset of symptoms does not rule out myocardial infarction with certainty. If myocardial infarction is still suspected, repeat the test at appropriate intervals.   I-Stat CG4 Lactic Acid, ED (Not at [facility])     Status: Abnormal   Collection Time: 04/25/15  5:47 AM  Result Value Ref Range   Lactic Acid, Venous 3.19 (HH) 0.5 - 2.0 mmol/L   Comment NOTIFIED PHYSICIAN     Dg Chest 2 View  04/25/2015  CLINICAL DATA:  Initial evaluation for possible sepsis. EXAM: CHEST  2 VIEW COMPARISON:  None. FINDINGS: Examination is somewhat limited by technique and lordotic angulation. Cardiac and mediastinal silhouettes grossly within normal limits. Spinal stimulator electrodes noted. Lungs are hypoinflated. Mild right basilar atelectasis. There is diffuse peribronchial thickening with mild prominence of the interstitial markings, greatest within the left mid and lower lung. Finding is nonspecific, but may reflect sequela of bronchiolitis or possible atypical/interstitial pneumonia. No consolidative airspace opacity. No pulmonary edema or pleural effusion. No pneumothorax. No acute osseus abnormality. IMPRESSION: Diffuse peribronchial thickening with prominence of the interstitial markings, greatest within the mid and lower left lung. Finding is nonspecific but may reflect sequela of bronchiolitis or possible atypical/ interstitial pneumonia. No consolidative airspace disease. Electronically Signed   By: Jeannine Boga M.D.   On: 04/25/2015 01:40   Ct Abdomen Pelvis W Contrast  04/25/2015  CLINICAL DATA:  Low abdominal pain and low chest pain since yesterday. Constipation on Tuesday and Wednesday. Fever. EXAM: CT ABDOMEN AND PELVIS WITH CONTRAST TECHNIQUE: Multidetector CT  imaging of the abdomen and pelvis was performed using the standard protocol following bolus administration of intravenous contrast. CONTRAST:  174mL OMNIPAQUE IOHEXOL 300 MG/ML  SOLN COMPARISON:  None. FINDINGS: Interstitial fibrosis in the lung bases with ground-glass alveolitis particularly on the right. Calcified granulomas in the lung bases. Coronary artery calcifications. The gallbladder is distended. No stones are identified but there is evidence of wall thickening and edema with mild inflammatory reaction around the gallbladder. Mild intra and extrahepatic bile duct dilatation. No discrete stone or mass identified. There is infiltration around the distal aspect of the stomach and duodenal bulb region and around the head of the pancreas. This is nonspecific and could represent inflammatory change due to peptic ulcer disease, cholecystitis, or focal pancreatitis. The spleen, inferior vena cava, and retroperitoneal lymph nodes are unremarkable. Calcification of the aorta without aneurysm. Subcentimeter cysts in the right kidney. No hydronephrosis in the kidneys. Nodule in the left adrenal gland measures 18 mm diameter. Hounsfield unit measurements are indeterminate but statistically this most likely represents benign adenoma. Consider followup in 1 year with noncontrast CT. Stomach, small bowel, and colon are mostly decompressed. No  free air or free fluid in the abdomen. Pelvis: Prostate gland is mildly enlarged. Bladder wall is not thickened. No free or loculated pelvic fluid collections. No pelvic mass or lymphadenopathy. Rectosigmoid colon is unremarkable. Appendix is not identified. Degenerative changes in the spine. Spondylolysis with mild spondylolisthesis at L5-S1. No destructive bone lesions. Spinal stimulator device. IMPRESSION: Inflammatory process in the right upper quadrant with distended thick-walled gallbladder, bile duct dilatation, and infiltration in the adjacent fat. Etiology is nonspecific in  could indicate cholecystitis, focal pancreatitis, or peptic ulcer disease. No evidence of bowel perforation or obstruction. Interstitial fibrosis and inflammatory changes in the lung bases. Electronically Signed   By: Lucienne Capers M.D.   On: 04/25/2015 02:44   Dg Chest Portable 1 View  04/25/2015  CLINICAL DATA:  Central line placement.  Initial encounter. EXAM: PORTABLE CHEST 1 VIEW COMPARISON:  Chest radiograph performed earlier today at 1:09 a.m. FINDINGS: The right IJ line is noted ending about the mid SVC. The lungs are hypoexpanded. Vascular crowding and vascular congestion are noted. Patchy bilateral opacities may reflect atelectasis or possibly mild pneumonia. No definite pleural effusion or pneumothorax is seen. The cardiomediastinal silhouette is normal in size. No acute osseous abnormalities are seen. Spinal stimulation leads are partially imaged. IMPRESSION: 1. Right IJ line noted ending about the mid SVC. 2. Lungs hypoexpanded. Vascular congestion noted. Patchy bilateral airspace opacities may reflect atelectasis or possibly mild pneumonia, more prominent than on the prior study. Electronically Signed   By: Garald Balding M.D.   On: 04/25/2015 07:11    Review of Systems  Constitutional: Positive for chills and malaise/fatigue. Negative for weight loss.  HENT: Negative for hearing loss.   Eyes: Negative for blurred vision and double vision.  Respiratory: Negative for cough and hemoptysis.   Cardiovascular: Negative for chest pain and palpitations.  Gastrointestinal: Positive for nausea, abdominal pain and constipation. Negative for blood in stool.  Genitourinary: Negative for dysuria and urgency.  Musculoskeletal: Negative for myalgias and neck pain.  Skin: Negative for itching and rash.  Neurological: Negative for tingling, tremors and headaches.  Endo/Heme/Allergies: Negative for environmental allergies. Does not bruise/bleed easily.  Psychiatric/Behavioral: Negative for  depression and hallucinations.   Blood pressure 88/64, pulse 115, temperature 98.7 F (37.1 C), temperature source Oral, resp. rate 30, height $RemoveBe'5\' 11"'[id number]$  (1.803 m), weight 83.915 kg (185 lb), SpO2 95 %. Physical Exam  Constitutional: He is oriented to person, place, and time. He appears well-developed and well-nourished.  HENT:  Head: Normocephalic and atraumatic.  Eyes: EOM are normal. No scleral icterus.  Neck: Normal range of motion. Neck supple.  Respiratory: No respiratory distress. He has no wheezes.  GI: Soft. He exhibits no distension. There is tenderness. There is no rebound and no guarding.  Musculoskeletal: Normal range of motion. He exhibits no edema.  Neurological: He is alert and oriented to person, place, and time.  Skin: Skin is warm and dry.  Psychiatric: He has a normal mood and affect. His behavior is normal.    Assessment/Plan: Sepsis with distended GB with inflammatory changes to wall.  -Given no other identifiable cause, cholecystostomy has a high likelihood to help his sepsis. -continue pressor support -continue broad antibiotics  Arta Bruce Marquita Lias 04/25/2015, 8:14 AM

## 2015-04-25 NOTE — ED Notes (Signed)
Dr. Earleen Newport, IR,  at bedside.

## 2015-04-25 NOTE — ED Notes (Signed)
Per MD Pollina, recently placed right IJ triple lumen IV access is okay to use.

## 2015-04-25 NOTE — Progress Notes (Signed)
CRITICAL VALUE ALERT  Critical value received:  Lactic acid  Date of notification:  04-25-15  Time of notification:  [id number]  Critical value read back:Yes.    Nurse who received alert:  Dorena Cookey  MD notified (1st page):  Titus Mould  Time of first page:  1145  MD notified (2nd page):Feinstein    Time of second page:1215  Responding MD:  Titus Mould  Time MD responded:  1245

## 2015-04-25 NOTE — Progress Notes (Signed)
CRITICAL VALUE ALERT  Critical value received:  Lactic 2.9  Date of notification:  04/25/15  Time of notification:  [id number]  Critical value read back:Yes.    Nurse who received alert:  Dorena Cookey  MD notified (1st page):  Titus Mould  Time of first page:  1450  MD notified (2nd page):  Time of second page:  Responding MD:  Titus Mould  Time MD responded: [id number]

## 2015-04-25 NOTE — ED Notes (Signed)
Wife states pt started shaking tonight in bed he was cool and clammy to touch.

## 2015-04-25 NOTE — Procedures (Signed)
Interventional Radiology Procedure Note  Procedure: Image guided perc chole.  70F drain placed.    Findings: foul-smelling biliary fluid drained.  Sample sent.  Complications: None Recommendations:  - Record output daily - To ICU for monitoring given sepsis - Can initiate tube injection/check after 6 weeks with VIR, or per surgery for interval cholecystectomy.  Dr. Gurney Maxin consulting surgeon.  Signed,  Dulcy Fanny. Earleen Newport, DO

## 2015-04-25 NOTE — Progress Notes (Signed)
ANTIBIOTIC CONSULT NOTE - INITIAL  Pharmacy Consult for Vanco/Zosyn/fluconazole Indication: Sepsis, intra-abdominal infeciton  No Known Allergies  Patient Measurements: Height: 5\' 11"  (180.3 cm) Weight: 185 lb (83.915 kg) IBW/kg (Calculated) : 75.3 Adjusted Body Weight:   Vital Signs: Temp: 98.7 F (37.1 C) (11/24 0716) Temp Source: Oral (11/24 0716) BP: 88/64 mmHg (11/24 0800) Pulse Rate: 115 (11/24 0800) Intake/Output from previous day:   Intake/Output from this shift: Total I/O In: 4000 [I.V.:4000] Out: -   Labs:  Recent Labs  04/25/15 0045 04/25/15 0100  WBC 4.2  --   HGB 13.0 13.3  PLT 165  --   CREATININE 0.88 0.80   Estimated Creatinine Clearance: 98 mL/min (by C-G formula based on Cr of 0.8). No results for input(s): VANCOTROUGH, VANCOPEAK, VANCORANDOM, GENTTROUGH, GENTPEAK, GENTRANDOM, TOBRATROUGH, TOBRAPEAK, TOBRARND, AMIKACINPEAK, AMIKACINTROU, AMIKACIN in the last 72 hours.   Microbiology: No results found for this or any previous visit (from the past 720 hour(s)).  Medical History: Past Medical History  Diagnosis Date  . Arthritis   . History of depression   . Headache(784.0)   . CAD (coronary artery disease)     3 stents  . HTN (hypertension)   . HLD (hyperlipidemia)   . Migraines   . Neuropathy ([place])   . Post-polio syndrome   . Shoulder pain, right   . OSA (obstructive sleep apnea)     does not want to use CPAP, using mouth guard  . Chronic pain syndrome     [facility]    Medications:  F/u med rec  Assessment: 64 y/o M presents with fever, N/V, constipation, worsening abdominal pain, hypotension requiring pressors. Surgery notes sepsis with distended GB with inflammatory changes to wall.   ID: Empiric abx for sepsis and intra-abdominal infection.Tmax 102.9. Currently afebrile. WBC 4.2. CrCl 98  11/24 Vanco>> 11/24 Zosyn>> 11/24 Fluconazole>>  Goal of Therapy:  Vancomycin trough level 15-20 mcg/ml  Plan:  -Zosyn  3.375G IV q8h to be infused over 4 hours - Vanco 1g IV q8hr.Trough at steady state - Fluconazole 400mg  IV daily.   Wilmont Olund S. Alford Highland, PharmD, [place] Clinical Staff Pharmacist Pager [phone]  Eilene Ghazi Stillinger 04/25/2015,8:21 AM

## 2015-04-25 NOTE — ED Provider Notes (Addendum)
CSN: [id number]     Arrival date & time 04/25/15  [id number] History  By signing my name below, I, Sonum Patel, attest that this documentation has been prepared under the direction and in the presence of Orpah Greek, MD. Electronically Signed: Sonum Patel, Education administrator. 04/25/2015. 12:53 AM.    Chief Complaint  Patient presents with  . Code Sepsis    The history is provided by the patient. No language interpreter was used.    HPI Comments: Alexander [name] is a 64 y.o. male with past medical history of CAD, HTN, HLD who presents to the Emergency Department complaining of abdominal pain with associated nausea, vomiting, and fever that began 2 days ago. Patient states the nausea and vomiting have resolved but the abdominal pain persists with constipation. He states initially he thought his symptoms were related to suspicious food intake. He reports a TMAX of over 60 F at home and is 102.9 F in the ED. Per wife, patient felt cool and clammy to touch tonight so brought patient here for evaluation. He denies nausea currently.   Past Medical History  Diagnosis Date  . Arthritis   . History of depression   . Headache(784.0)   . CAD (coronary artery disease)     3 stents  . HTN (hypertension)   . HLD (hyperlipidemia)   . Migraines   . Neuropathy ([place])   . Post-polio syndrome   . Shoulder pain, right   . OSA (obstructive sleep apnea)     does not want to use CPAP, using mouth guard  . Chronic pain syndrome     [facility]   Past Surgical History  Procedure Laterality Date  . Coronary angioplasty with stent placement  1995, 2000, 2005  . Uvulopalatopharyngoplasty, tonsillectomy and septoplasty  2002  . Lumbar spine stimulator    . Cervical spine stimulator  08/2012  . Colonoscopy  09/2013    TA x2, rpt 5 yrs ([facility])   Family History  Problem Relation Age of Onset  . Diabetes Mother   . Cancer Sister 29    male cancer  . Coronary artery disease Neg Hx   . Stroke Neg Hx   .  Colon cancer Neg Hx   . Pancreatic cancer Neg Hx   . Stomach cancer Neg Hx   . Cancer Brother 40    breast   Social History  Substance Use Topics  . Smoking status: Never Smoker   . Smokeless tobacco: Never Used  . Alcohol Use: No    Review of Systems  Constitutional: Positive for fever.  Gastrointestinal: Positive for nausea (resolved), vomiting (resolved), abdominal pain and constipation.  All other systems reviewed and are negative.     Allergies  Review of patient's allergies indicates no known allergies.  Home Medications   Prior to Admission medications   Medication Sig Start Date End Date Taking? Authorizing Provider  aspirin EC 325 MG tablet Take 975 mg by mouth 2 (two) times daily.   Yes Historical Provider, MD  aspirin-acetaminophen-caffeine (EXCEDRIN MIGRAINE) [id number] MG per tablet Take 1-2 tablets by mouth every 8 (eight) hours as needed for headache. 12 daily 03/05/11  Yes [name], MD  atorvastatin (LIPITOR) 40 MG tablet TAKE 1 TABLET DAILY AT 6 P.M. 08/03/14  Yes [name], MD  Cholecalciferol ([employer] VITAMIN D) 2000 UNITS CAPS Take 1 capsule (2,000 Units total) by mouth daily. 03/29/13  Yes [name], MD  gabapentin (NEURONTIN) 600 MG tablet TAKE 2 TABLETS THREE TIMES A  DAY 03/11/15  Yes [name], MD  ibuprofen (ADVIL,MOTRIN) 200 MG tablet Take 400 mg by mouth every 6 (six) hours as needed for moderate pain.    Yes Historical Provider, MD  lisinopril (PRINIVIL,ZESTRIL) 10 MG tablet TAKE 1 TABLET DAILY 06/12/14  Yes [name], MD  methocarbamol (ROBAXIN) 500 MG tablet TAKE 1 TABLET THREE TIMES A DAY AS NEEDED FOR MUSCLE SPASM 10/04/14  Yes [name], MD  Multiple Vitamins-Minerals (MULTIVITAMIN ADULTS 50+ PO) Take 1 tablet by mouth daily.   Yes Historical Provider, MD  Omega-3 Fatty Acids (FISH OIL) 1000 MG CAPS Take 1 capsule by mouth daily.   Yes Historical Provider, MD  omeprazole (PRILOSEC) 40 MG capsule TAKE 1 CAPSULE  DAILY 11/26/14  Yes [name], MD  sildenafil (VIAGRA) 100 MG tablet Take 0.5-1 tablets (50-100 mg total) by mouth daily as needed for erectile dysfunction. 01/03/15  Yes [name], MD  vitamin C (ASCORBIC ACID) 500 MG tablet Take 1,000 mg by mouth daily.   Yes Historical Provider, MD  vitamin E 400 UNIT capsule Take 400 Units by mouth daily.   Yes Historical Provider, MD   BP 75/56 mmHg  Pulse 114  Temp(Src) 102.9 F (39.4 C) (Oral)  Resp 22  Ht 5\' 11"  (1.803 m)  Wt 185 lb (83.915 kg)  BMI 25.81 kg/m2  SpO2 94% Physical Exam  Constitutional: He is oriented to person, place, and time. He appears well-developed and well-nourished. No distress.  HENT:  Head: Normocephalic and atraumatic.  Right Ear: Hearing normal.  Left Ear: Hearing normal.  Nose: Nose normal.  Mouth/Throat: Oropharynx is clear and moist and mucous membranes are normal.  Eyes: Conjunctivae and EOM are normal. Pupils are equal, round, and reactive to light.  Neck: Normal range of motion. Neck supple.  Cardiovascular: Regular rhythm, S1 normal and S2 normal.  Exam reveals no gallop and no friction rub.   No murmur heard. Pulmonary/Chest: Effort normal and breath sounds normal. No respiratory distress. He exhibits no tenderness.  Abdominal: Soft. Normal appearance and bowel sounds are normal. There is no hepatosplenomegaly. There is tenderness. There is no rebound, no guarding, no tenderness at McBurney's point and negative Murphy's sign. No hernia.  Diffuse abdominal tenderness with increased tenderness to the right lower and left lower quadrants.   Musculoskeletal: Normal range of motion.  Neurological: He is alert and oriented to person, place, and time. He has normal strength. No cranial nerve deficit or sensory deficit. Coordination normal. GCS eye subscore is 4. GCS verbal subscore is 5. GCS motor subscore is 6.  Skin: Skin is warm, dry and intact. No rash noted. No cyanosis.  Psychiatric: He has a normal  mood and affect. His speech is normal and behavior is normal. Thought content normal.  Nursing note and vitals reviewed.   ED Course  .Central Line Date/Time: 04/25/2015 7:03 AM Performed by: Orpah Greek Authorized by: Orpah Greek Consent: Verbal consent obtained. Risks and benefits: risks, benefits and alternatives were discussed Consent given by: patient Patient understanding: patient states understanding of the procedure being performed Patient consent: the patient's understanding of the procedure matches consent given Procedure consent: procedure consent matches procedure scheduled Relevant documents: relevant documents present and verified Test results: test results available and properly labeled Site marked: the operative site was marked Imaging studies: imaging studies available Patient identity confirmed: verbally with patient, hospital-assigned identification number and arm band Time out: Immediately prior to procedure a "time out" was called to verify the correct  patient, procedure, equipment, support staff and site/side marked as required. Indications: vascular access Anesthesia: local infiltration Local anesthetic: lidocaine 1% without epinephrine Anesthetic total: 5 ml Patient sedated: no Preparation: skin prepped with 2% chlorhexidine Skin prep agent dried: skin prep agent completely dried prior to procedure Sterile barriers: all five maximum sterile barriers used - cap, mask, sterile gown, sterile gloves, and large sterile sheet Hand hygiene: hand hygiene performed prior to central venous catheter insertion Location details: right internal jugular Patient position: reverse Trendelenburg Catheter type: triple lumen Pre-procedure: landmarks identified Ultrasound guidance: yes Sterile ultrasound techniques: sterile gel and sterile probe covers were used Number of attempts: 3 Successful placement: yes Post-procedure: line sutured Assessment:  blood return through all ports,  free fluid flow,  no pneumothorax on x-ray and placement verified by x-ray   (including critical care time)  DIAGNOSTIC STUDIES: Oxygen Saturation is 98% on [place] 2L/min, normal by my interpretation.    COORDINATION OF CARE: 12:58 AM Discussed treatment plan with pt at bedside and pt agreed to plan.   Labs Review Labs Reviewed  COMPREHENSIVE METABOLIC PANEL - Abnormal; Notable for the following:    Potassium 3.0 (*)    CO2 19 (*)    Glucose, Bld 125 (*)    Calcium 8.5 (*)    Albumin 2.9 (*)    Alkaline Phosphatase 248 (*)    All other components within normal limits  CBC WITH DIFFERENTIAL/PLATELET - Abnormal; Notable for the following:    RBC 4.00 (*)    HCT 37.5 (*)    Lymphs Abs 0.3 (*)    All other components within normal limits  URINALYSIS, ROUTINE W REFLEX MICROSCOPIC (NOT AT [facility]) - Abnormal; Notable for the following:    Color, Urine AMBER (*)    Hgb urine dipstick SMALL (*)    Ketones, ur 15 (*)    Protein, ur 100 (*)    All other components within normal limits  TROPONIN I - Abnormal; Notable for the following:    Troponin I 0.08 (*)    All other components within normal limits  I-STAT CG4 LACTIC ACID, ED - Abnormal; Notable for the following:    Lactic Acid, Venous 3.79 (*)    All other components within normal limits  I-STAT CHEM 8, ED - Abnormal; Notable for the following:    Potassium 3.0 (*)    Glucose, Bld 123 (*)    Calcium, Ion 1.10 (*)    All other components within normal limits  I-STAT CG4 LACTIC ACID, ED - Abnormal; Notable for the following:    Lactic Acid, Venous 2.92 (*)    All other components within normal limits  I-STAT CG4 LACTIC ACID, ED - Abnormal; Notable for the following:    Lactic Acid, Venous 3.19 (*)    All other components within normal limits  CULTURE, BLOOD (ROUTINE X 2)  CULTURE, BLOOD (ROUTINE X 2)  URINE CULTURE  LIPASE, BLOOD  URINE MICROSCOPIC-ADD ON  I-STAT TROPOININ, ED  I-STAT CG4  LACTIC ACID, ED    Imaging Review Dg Chest 2 View  04/25/2015  CLINICAL DATA:  Initial evaluation for possible sepsis. EXAM: CHEST  2 VIEW COMPARISON:  None. FINDINGS: Examination is somewhat limited by technique and lordotic angulation. Cardiac and mediastinal silhouettes grossly within normal limits. Spinal stimulator electrodes noted. Lungs are hypoinflated. Mild right basilar atelectasis. There is diffuse peribronchial thickening with mild prominence of the interstitial markings, greatest within the left mid and lower lung. Finding is nonspecific, but may reflect  sequela of bronchiolitis or possible atypical/interstitial pneumonia. No consolidative airspace opacity. No pulmonary edema or pleural effusion. No pneumothorax. No acute osseus abnormality. IMPRESSION: Diffuse peribronchial thickening with prominence of the interstitial markings, greatest within the mid and lower left lung. Finding is nonspecific but may reflect sequela of bronchiolitis or possible atypical/ interstitial pneumonia. No consolidative airspace disease. Electronically Signed   By: Jeannine Boga M.D.   On: 04/25/2015 01:40   Ct Abdomen Pelvis W Contrast  04/25/2015  CLINICAL DATA:  Low abdominal pain and low chest pain since yesterday. Constipation on Tuesday and Wednesday. Fever. EXAM: CT ABDOMEN AND PELVIS WITH CONTRAST TECHNIQUE: Multidetector CT imaging of the abdomen and pelvis was performed using the standard protocol following bolus administration of intravenous contrast. CONTRAST:  116mL OMNIPAQUE IOHEXOL 300 MG/ML  SOLN COMPARISON:  None. FINDINGS: Interstitial fibrosis in the lung bases with ground-glass alveolitis particularly on the right. Calcified granulomas in the lung bases. Coronary artery calcifications. The gallbladder is distended. No stones are identified but there is evidence of wall thickening and edema with mild inflammatory reaction around the gallbladder. Mild intra and extrahepatic bile duct  dilatation. No discrete stone or mass identified. There is infiltration around the distal aspect of the stomach and duodenal bulb region and around the head of the pancreas. This is nonspecific and could represent inflammatory change due to peptic ulcer disease, cholecystitis, or focal pancreatitis. The spleen, inferior vena cava, and retroperitoneal lymph nodes are unremarkable. Calcification of the aorta without aneurysm. Subcentimeter cysts in the right kidney. No hydronephrosis in the kidneys. Nodule in the left adrenal gland measures 18 mm diameter. Hounsfield unit measurements are indeterminate but statistically this most likely represents benign adenoma. Consider followup in 1 year with noncontrast CT. Stomach, small bowel, and colon are mostly decompressed. No free air or free fluid in the abdomen. Pelvis: Prostate gland is mildly enlarged. Bladder wall is not thickened. No free or loculated pelvic fluid collections. No pelvic mass or lymphadenopathy. Rectosigmoid colon is unremarkable. Appendix is not identified. Degenerative changes in the spine. Spondylolysis with mild spondylolisthesis at L5-S1. No destructive bone lesions. Spinal stimulator device. IMPRESSION: Inflammatory process in the right upper quadrant with distended thick-walled gallbladder, bile duct dilatation, and infiltration in the adjacent fat. Etiology is nonspecific in could indicate cholecystitis, focal pancreatitis, or peptic ulcer disease. No evidence of bowel perforation or obstruction. Interstitial fibrosis and inflammatory changes in the lung bases. Electronically Signed   By: Lucienne Capers M.D.   On: 04/25/2015 02:44   I have personally reviewed and evaluated these images and lab results as part of my medical decision-making.   EKG Interpretation   Date/Time:  Thursday April 25 2015 00:39:00 EST Ventricular Rate:  135 PR Interval:  103 QRS Duration: 105 QT Interval:  421 QTC Calculation: 631 R Axis:   35 Text  Interpretation:  Sinus tachycardia Abnormal R-wave progression, early  transition Borderline repolarization abnormality Prolonged QT interval No  previous tracing Confirmed by Deija Buhrman  MD, [place] [phone]) on  04/25/2015 1:04:16 AM      MDM   Final diagnoses:  None  Sepsis  Presents to emergency department with abdominal pain and fever. He had nausea and vomiting 2 days ago which resolved, then he started to feel constipated. He has been experiencing increasing pain, diffusely in the abdomen. He has felt "like there is a rock in the abdomen".  Patient was febrile 102.9 at arrival. He was expressing tachycardia, tachypnea but was not hypotensive. Code sepsis initiated.  After a period in the ER he began to develop chest pain. He reports a history of coronary artery disease with stents. Initial EKG did not show any ischemia. Repeat EKG was performed was unchanged. Troponin was ordered, was slightly elevated at 0.08.  Patient continued to complain of abdominal pain. CT scan of abdomen and pelvis was performed. Results or inflammatory process in the right upper quadrant including gallbladder wall thickening, but there was no gallstone or obvious cause of the inflammation. Considerations are cholecystitis, pancreatitis, peptic ulcer disease.  Patient had been administered sepsis dosing IV fluids, Zosyn (based on assumed abdominal process for sepsis) at arrival.   Case was discussed with critical care. They will come to see the patient for admission. They asked for surgical consultation. I did discuss the case briefly with Dr. Hulen Skains. After reviewing the patient's labs, presentation, CT scan findings, he did not feel that the patient required any surgical intervention. He felt that if the gallbladder was ultimately felt to be the cause of the patient's symptoms, he would need percutaneous cholecystostomy, not surgery. At this point, however, it is not clear if the gallbladder is a source he does  not require pursuing this yet.  Patient has become hypotensive and tachycardic here in the ER. This is considered likely secondary to sepsis. He had been administered sepsis dosing fluids, additional fluids were given. [facility] recommended initiating levophed. This was started performed dosing initially and then a central line was placed.  CRITICAL CARE Performed by: Orpah Greek   Total critical care time: 45 minutes  Critical care time was exclusive of separately billable procedures and treating other patients.  Critical care was necessary to treat or prevent imminent or life-threatening deterioration.  Critical care was time spent personally by me on the following activities: development of treatment plan with patient and/or surrogate as well as nursing, discussions with consultants, evaluation of patient's response to treatment, examination of patient, obtaining history from patient or surrogate, ordering and performing treatments and interventions, ordering and review of laboratory studies, ordering and review of radiographic studies, pulse oximetry and re-evaluation of patient's condition.   I personally performed the services described in this documentation, which was scribed in my presence. The recorded information has been reviewed and is accurate.   Orpah Greek, MD 04/25/15 [id number]  Orpah Greek, MD 04/25/15 [phone]

## 2015-04-26 LAB — URINE CULTURE: Culture: 4000

## 2015-04-26 LAB — CBC
HCT: 32 % — ABNORMAL LOW (ref 39.0–52.0)
Hemoglobin: 10.9 g/dL — ABNORMAL LOW (ref 13.0–17.0)
MCH: 32 pg (ref 26.0–34.0)
MCHC: 34.1 g/dL (ref 30.0–36.0)
MCV: 93.8 fL (ref 78.0–100.0)
PLATELETS: 145 10*3/uL — AB (ref 150–400)
RBC: 3.41 MIL/uL — AB (ref 4.22–5.81)
RDW: 14 % (ref 11.5–15.5)
WBC: 18.9 10*3/uL — ABNORMAL HIGH (ref 4.0–10.5)

## 2015-04-26 LAB — MAGNESIUM: Magnesium: 2.5 mg/dL — ABNORMAL HIGH (ref 1.7–2.4)

## 2015-04-26 LAB — BASIC METABOLIC PANEL
Anion gap: 6 (ref 5–15)
BUN: 16 mg/dL (ref 6–20)
CHLORIDE: 111 mmol/L (ref 101–111)
CO2: 21 mmol/L — AB (ref 22–32)
Calcium: 7.9 mg/dL — ABNORMAL LOW (ref 8.9–10.3)
Creatinine, Ser: 0.7 mg/dL (ref 0.61–1.24)
GFR calc Af Amer: >60 mL/min (ref >60)
GFR calc non Af Amer: >60 mL/min (ref >60)
GLUCOSE: 112 mg/dL — AB (ref 65–99)
POTASSIUM: 3 mmol/L — AB (ref 3.5–5.1)
Sodium: 138 mmol/L (ref 135–145)

## 2015-04-26 LAB — PHOSPHORUS: Phosphorus: 1.8 mg/dL — ABNORMAL LOW (ref 2.5–4.6)

## 2015-04-26 MED ORDER — POTASSIUM CHLORIDE 10 MEQ/50ML IV SOLN
10.0000 meq | INTRAVENOUS | Status: AC
  Administered 2015-04-26 (×4): 10 meq via INTRAVENOUS
  Filled 2015-04-26 (×4): qty 50

## 2015-04-26 MED ORDER — METHOCARBAMOL 500 MG PO TABS
500.0000 mg | ORAL_TABLET | Freq: Three times a day (TID) | ORAL | Status: DC | PRN
  Filled 2015-04-26: qty 1

## 2015-04-26 MED ORDER — GABAPENTIN 600 MG PO TABS
1200.0000 mg | ORAL_TABLET | Freq: Three times a day (TID) | ORAL | Status: DC
  Administered 2015-04-26 – 2015-05-02 (×18): 1200 mg via ORAL
  Filled 2015-04-26 (×20): qty 2

## 2015-04-26 MED ORDER — POTASSIUM PHOSPHATES 15 MMOLE/5ML IV SOLN
30.0000 mmol | Freq: Once | INTRAVENOUS | Status: AC
  Administered 2015-04-26: 30 mmol via INTRAVENOUS
  Filled 2015-04-26: qty 10

## 2015-04-26 MED ORDER — PNEUMOCOCCAL VAC POLYVALENT 25 MCG/0.5ML IJ INJ: 0.5000 mL | INJECTION | INTRAMUSCULAR | Status: DC

## 2015-04-26 MED ORDER — PNEUMOCOCCAL 13-VAL CONJ VACC IM SUSP
0.5000 mL | INTRAMUSCULAR | Status: AC
  Administered 2015-04-28: 0.5 mL via INTRAMUSCULAR
  Filled 2015-04-26 (×2): qty 0.5

## 2015-04-26 NOTE — Progress Notes (Signed)
Referring Physician(s): Dr. Betsey Holiday  Chief Complaint:  Abdominal pain, fever, acute cholecystitis S/P perc chole by Dr. Earleen Newport 04/25/2015  Subjective:  Alexander [name] is feeling much better today.  He has not had a fever since yesterday afternoon.  He has no complaints. He is tolerating a liquid diet.  Allergies: Review of patient's allergies indicates no known allergies.  Medications: Prior to Admission medications   Medication Sig Start Date End Date Taking? Authorizing Provider  aspirin EC 325 MG tablet Take 975 mg by mouth 2 (two) times daily.   Yes Historical Provider, MD  aspirin-acetaminophen-caffeine (EXCEDRIN MIGRAINE) [phone] MG per tablet Take 1-2 tablets by mouth every 8 (eight) hours as needed for headache. 12 daily 03/05/11  Yes [name], MD  atorvastatin (LIPITOR) 40 MG tablet TAKE 1 TABLET DAILY AT 6 P.M. 08/03/14  Yes [name], MD  Cholecalciferol ([employer] VITAMIN D) 2000 UNITS CAPS Take 1 capsule (2,000 Units total) by mouth daily. 03/29/13  Yes [name], MD  gabapentin (NEURONTIN) 600 MG tablet TAKE 2 TABLETS THREE TIMES A DAY 03/11/15  Yes [name], MD  ibuprofen (ADVIL,MOTRIN) 200 MG tablet Take 400 mg by mouth every 6 (six) hours as needed for moderate pain.    Yes Historical Provider, MD  lisinopril (PRINIVIL,ZESTRIL) 10 MG tablet TAKE 1 TABLET DAILY 06/12/14  Yes [name], MD  methocarbamol (ROBAXIN) 500 MG tablet TAKE 1 TABLET THREE TIMES A DAY AS NEEDED FOR MUSCLE SPASM 10/04/14  Yes [name], MD  Multiple Vitamins-Minerals (MULTIVITAMIN ADULTS 50+ PO) Take 1 tablet by mouth daily.   Yes Historical Provider, MD  Omega-3 Fatty Acids (FISH OIL) 1000 MG CAPS Take 1 capsule by mouth daily.   Yes Historical Provider, MD  omeprazole (PRILOSEC) 40 MG capsule TAKE 1 CAPSULE DAILY 11/26/14  Yes [name], MD  sildenafil (VIAGRA) 100 MG tablet Take 0.5-1 tablets (50-100 mg total) by mouth daily as needed for erectile  dysfunction. 01/03/15  Yes [name], MD  vitamin C (ASCORBIC ACID) 500 MG tablet Take 1,000 mg by mouth daily.   Yes Historical Provider, MD  vitamin E 400 UNIT capsule Take 400 Units by mouth daily.   Yes Historical Provider, MD     Vital Signs: BP 142/89 mmHg  Pulse 89  Temp(Src) 97.7 F (36.5 C) (Oral)  Resp 36  Ht 5\' 11"  (1.803 m)  Wt 184 lb 1.4 oz (83.5 kg)  BMI 25.69 kg/m2  SpO2 95%  Physical Exam  Awake and Alert NAD Abdomen soft, much less tenderness today Perc chole drain in place, site looks good. ~350 mls bilious drainage. Does not appear purulent.  Imaging: Dg Chest 2 View  04/25/2015  CLINICAL DATA:  Initial evaluation for possible sepsis. EXAM: CHEST  2 VIEW COMPARISON:  None. FINDINGS: Examination is somewhat limited by technique and lordotic angulation. Cardiac and mediastinal silhouettes grossly within normal limits. Spinal stimulator electrodes noted. Lungs are hypoinflated. Mild right basilar atelectasis. There is diffuse peribronchial thickening with mild prominence of the interstitial markings, greatest within the left mid and lower lung. Finding is nonspecific, but may reflect sequela of bronchiolitis or possible atypical/interstitial pneumonia. No consolidative airspace opacity. No pulmonary edema or pleural effusion. No pneumothorax. No acute osseus abnormality. IMPRESSION: Diffuse peribronchial thickening with prominence of the interstitial markings, greatest within the mid and lower left lung. Finding is nonspecific but may reflect sequela of bronchiolitis or possible atypical/ interstitial pneumonia. No consolidative airspace disease. Electronically Signed   By: Pincus Badder.D.  On: 04/25/2015 01:40   Ct Abdomen Pelvis W Contrast  04/25/2015  CLINICAL DATA:  Low abdominal pain and low chest pain since yesterday. Constipation on Tuesday and Wednesday. Fever. EXAM: CT ABDOMEN AND PELVIS WITH CONTRAST TECHNIQUE: Multidetector CT imaging of the  abdomen and pelvis was performed using the standard protocol following bolus administration of intravenous contrast. CONTRAST:  165mL OMNIPAQUE IOHEXOL 300 MG/ML  SOLN COMPARISON:  None. FINDINGS: Interstitial fibrosis in the lung bases with ground-glass alveolitis particularly on the right. Calcified granulomas in the lung bases. Coronary artery calcifications. The gallbladder is distended. No stones are identified but there is evidence of wall thickening and edema with mild inflammatory reaction around the gallbladder. Mild intra and extrahepatic bile duct dilatation. No discrete stone or mass identified. There is infiltration around the distal aspect of the stomach and duodenal bulb region and around the head of the pancreas. This is nonspecific and could represent inflammatory change due to peptic ulcer disease, cholecystitis, or focal pancreatitis. The spleen, inferior vena cava, and retroperitoneal lymph nodes are unremarkable. Calcification of the aorta without aneurysm. Subcentimeter cysts in the right kidney. No hydronephrosis in the kidneys. Nodule in the left adrenal gland measures 18 mm diameter. Hounsfield unit measurements are indeterminate but statistically this most likely represents benign adenoma. Consider followup in 1 year with noncontrast CT. Stomach, small bowel, and colon are mostly decompressed. No free air or free fluid in the abdomen. Pelvis: Prostate gland is mildly enlarged. Bladder wall is not thickened. No free or loculated pelvic fluid collections. No pelvic mass or lymphadenopathy. Rectosigmoid colon is unremarkable. Appendix is not identified. Degenerative changes in the spine. Spondylolysis with mild spondylolisthesis at L5-S1. No destructive bone lesions. Spinal stimulator device. IMPRESSION: Inflammatory process in the right upper quadrant with distended thick-walled gallbladder, bile duct dilatation, and infiltration in the adjacent fat. Etiology is nonspecific in could indicate  cholecystitis, focal pancreatitis, or peptic ulcer disease. No evidence of bowel perforation or obstruction. Interstitial fibrosis and inflammatory changes in the lung bases. Electronically Signed   By: Lucienne Capers M.D.   On: 04/25/2015 02:44   Ir Perc Cholecystostomy  04/25/2015  INDICATION: Acute cholecystitis. Patient is septic, with pharmacologic pressure support. EXAM: ULTRASOUND AND FLUOROSCOPIC-GUIDED CHOLECYSTOSTOMY TUBE PLACEMENT COMPARISON:  CT 04/25/2015 MEDICATIONS: Fentanyl 5.0 mcg IV; Versed 25 mg IV; The patient is currently admitted to the hospital and on intravenous antibiotics. Antibiotics were administered within an appropriate time frame prior to skin puncture. ANESTHESIA/SEDATION: Total Moderate Sedation Time Twenty minutes CONTRAST:  4mL OMNIPAQUE IOHEXOL 300 MG/ML  SOLN FLUOROSCOPY TIME:  2 minutes 6 seconds COMPLICATIONS: None PROCEDURE: Informed written consent was obtained from the patient after a discussion of the risks, benefits and alternatives to treatment. Questions regarding the procedure were encouraged and answered. A timeout was performed prior to the initiation of the procedure. The right upper abdominal quadrant was prepped and draped in the usual sterile fashion, and a sterile drape was applied covering the operative field. Maximum barrier sterile technique with sterile gowns and gloves were used for the procedure. A timeout was performed prior to the initiation of the procedure. Local anesthesia was provided with 1% lidocaine with epinephrine. Ultrasound scanning of the right upper quadrant demonstrates a markedly dilated gallbladder. Of note, the patient reported pain with ultrasound imaging over the gallbladder. Utilizing a transhepatic approach, a 22 gauge needle was advanced into the gallbladder under direct ultrasound guidance. An ultrasound image was saved for documentation purposes. Appropriate intraluminal puncture was confirmed with the  efflux of bile and  advancement of an 0.018 wire into the gallbladder lumen. The needle was exchanged for an [place] set. A small amount of contrast was injected to confirm appropriate intraluminal positioning. Over a Benson wire, a 18.2-French Cook cholecystomy tube was advanced into the gallbladder fossa, coiled and locked. Bile was aspirated and a small amount of contrast was injected as several post procedural spot radiographic images were obtained in various obliquities. The catheter was secured to the skin with suture, connected to a drainage bag and a dressing was placed. The patient tolerated the procedure well without immediate post procedural complication. IMPRESSION: Status post image guided percutaneous cholecystostomy tube. Sample sent for analysis. Signed, Dulcy Fanny. Earleen Newport, DO [facility] Electronically Signed   By: Corrie Mckusick D.O.   On: 04/25/2015 12:46   Dg Chest Portable 1 View  04/25/2015  CLINICAL DATA:  Central line placement.  Initial encounter. EXAM: PORTABLE CHEST 1 VIEW COMPARISON:  Chest radiograph performed earlier today at 1:09 a.m. FINDINGS: The right IJ line is noted ending about the mid SVC. The lungs are hypoexpanded. Vascular crowding and vascular congestion are noted. Patchy bilateral opacities may reflect atelectasis or possibly mild pneumonia. No definite pleural effusion or pneumothorax is seen. The cardiomediastinal silhouette is normal in size. No acute osseous abnormalities are seen. Spinal stimulation leads are partially imaged. IMPRESSION: 1. Right IJ line noted ending about the mid SVC. 2. Lungs hypoexpanded. Vascular congestion noted. Patchy bilateral airspace opacities may reflect atelectasis or possibly mild pneumonia, more prominent than on the prior study. Electronically Signed   By: Garald Balding M.D.   On: 04/25/2015 07:11    Labs:  CBC:  Recent Labs  04/25/15 0045 04/25/15 0100 04/25/15 1051 04/26/15 0414    WBC 4.2  --  23.7* 18.9*  HGB 13.0 13.3 11.5* 10.9*  HCT 37.5* 39.0 33.0* 32.0*  PLT 165  --  142* 145*    COAGS:  Recent Labs  04/25/15 1051  INR 1.36  APTT 42*    BMP:  Recent Labs  07/05/14 0847 04/25/15 0045 04/25/15 0100 04/25/15 1051 04/26/15 0414  NA 139 135 136 136 138  K 4.1 3.0* 3.0* 2.8* 3.0*  CL 103 103 102 108 111  CO2 25 19*  --  17* 21*  GLUCOSE 99 125* 123* 157* 112*  BUN 19 16 18 19 16   CALCIUM 9.6 8.5*  --  7.5* 7.9*  CREATININE 0.82 0.88 0.80 1.07 0.70  GFRNONAA  --  >60  --  >60 >60  GFRAA  --  >60  --  >60 >60    LIVER FUNCTION TESTS:  Recent Labs  07/05/14 0847 04/25/15 0045 04/25/15 1051  BILITOT 0.4 0.8 1.4*  AST 28 32 34  ALT 40 63 51  ALKPHOS 56 248* 190*  PROT 7.2 6.5 5.5*  ALBUMIN 4.4 2.9* 2.3*    Assessment and Plan:  Acute Cholecysitis  = S/P perc drain by Dr. Earleen Newport 04/26/2015  Continue to record output daily.  Can initiate tube injection/check after 6 weeks with VIR, or per surgery for interval cholecystectomy.  Dr. Gurney Maxin consulting surgeon  Signed: Murrell Redden PA-C 04/26/2015, 1:31 PM   I spent a total of 15 Minutes at the the patient's bedside AND on the patient's hospital floor or unit, greater than 50% of which was counseling/coordinating care for f/u after perc chole.

## 2015-04-26 NOTE — Progress Notes (Signed)
Utilization review completed.  

## 2015-04-26 NOTE — Progress Notes (Signed)
Subjective: Feeling better after perc chole. Pressure better Less pain  Objective: Vital signs in last 24 hours: Temp:  [97.3 F (36.3 C)-99 F (37.2 C)] 97.3 F (36.3 C) (11/25 0744) Pulse Rate:  [80-115] 80 (11/25 0600) Resp:  [20-33] 26 (11/25 0600) BP: (84-125)/(63-87) 117/86 mmHg (11/25 0600) SpO2:  [91 %-97 %] 96 % (11/25 0600) Weight:  [83.5 kg (184 lb 1.4 oz)] 83.5 kg (184 lb 1.4 oz) (11/25 0138) Last BM Date:  (pta)  Intake/Output from previous day: 11/24 0701 - 11/25 0700 In: 7430.5 [I.V.:6370.5; IV Piggyback:1060] Out: 2375 [Urine:2025; Drains:350] Intake/Output this shift: Total I/O In: -  Out: 175 [Urine:175]  Abdomen soft, mildly tender Drain with bile  Lab Results:   Recent Labs  04/25/15 1051 04/26/15 0414  WBC 23.7* 18.9*  HGB 11.5* 10.9*  HCT 33.0* 32.0*  PLT 142* 145*   BMET  Recent Labs  04/25/15 1051 04/26/15 0414  NA 136 138  K 2.8* 3.0*  CL 108 111  CO2 17* 21*  GLUCOSE 157* 112*  BUN 19 16  CREATININE 1.07 0.70  CALCIUM 7.5* 7.9*   PT/INR  Recent Labs  04/25/15 1051  LABPROT 16.9*  INR 1.36   ABG No results for input(s): PHART, HCO3 in the last 72 hours.  Invalid input(s): PCO2, PO2  Studies/Results: Dg Chest 2 View  04/25/2015  CLINICAL DATA:  Initial evaluation for possible sepsis. EXAM: CHEST  2 VIEW COMPARISON:  None. FINDINGS: Examination is somewhat limited by technique and lordotic angulation. Cardiac and mediastinal silhouettes grossly within normal limits. Spinal stimulator electrodes noted. Lungs are hypoinflated. Mild right basilar atelectasis. There is diffuse peribronchial thickening with mild prominence of the interstitial markings, greatest within the left mid and lower lung. Finding is nonspecific, but may reflect sequela of bronchiolitis or possible atypical/interstitial pneumonia. No consolidative airspace opacity. No pulmonary edema or pleural effusion. No pneumothorax. No acute osseus abnormality.  IMPRESSION: Diffuse peribronchial thickening with prominence of the interstitial markings, greatest within the mid and lower left lung. Finding is nonspecific but may reflect sequela of bronchiolitis or possible atypical/ interstitial pneumonia. No consolidative airspace disease. Electronically Signed   By: Jeannine Boga M.D.   On: 04/25/2015 01:40   Ct Abdomen Pelvis W Contrast  04/25/2015  CLINICAL DATA:  Low abdominal pain and low chest pain since yesterday. Constipation on Tuesday and Wednesday. Fever. EXAM: CT ABDOMEN AND PELVIS WITH CONTRAST TECHNIQUE: Multidetector CT imaging of the abdomen and pelvis was performed using the standard protocol following bolus administration of intravenous contrast. CONTRAST:  147mL OMNIPAQUE IOHEXOL 300 MG/ML  SOLN COMPARISON:  None. FINDINGS: Interstitial fibrosis in the lung bases with ground-glass alveolitis particularly on the right. Calcified granulomas in the lung bases. Coronary artery calcifications. The gallbladder is distended. No stones are identified but there is evidence of wall thickening and edema with mild inflammatory reaction around the gallbladder. Mild intra and extrahepatic bile duct dilatation. No discrete stone or mass identified. There is infiltration around the distal aspect of the stomach and duodenal bulb region and around the head of the pancreas. This is nonspecific and could represent inflammatory change due to peptic ulcer disease, cholecystitis, or focal pancreatitis. The spleen, inferior vena cava, and retroperitoneal lymph nodes are unremarkable. Calcification of the aorta without aneurysm. Subcentimeter cysts in the right kidney. No hydronephrosis in the kidneys. Nodule in the left adrenal gland measures 18 mm diameter. Hounsfield unit measurements are indeterminate but statistically this most likely represents benign adenoma. Consider followup in 1  year with noncontrast CT. Stomach, small bowel, and colon are mostly decompressed.  No free air or free fluid in the abdomen. Pelvis: Prostate gland is mildly enlarged. Bladder wall is not thickened. No free or loculated pelvic fluid collections. No pelvic mass or lymphadenopathy. Rectosigmoid colon is unremarkable. Appendix is not identified. Degenerative changes in the spine. Spondylolysis with mild spondylolisthesis at L5-S1. No destructive bone lesions. Spinal stimulator device. IMPRESSION: Inflammatory process in the right upper quadrant with distended thick-walled gallbladder, bile duct dilatation, and infiltration in the adjacent fat. Etiology is nonspecific in could indicate cholecystitis, focal pancreatitis, or peptic ulcer disease. No evidence of bowel perforation or obstruction. Interstitial fibrosis and inflammatory changes in the lung bases. Electronically Signed   By: Lucienne Capers M.D.   On: 04/25/2015 02:44   Ir Perc Cholecystostomy  04/25/2015  INDICATION: Acute cholecystitis. Patient is septic, with pharmacologic pressure support. EXAM: ULTRASOUND AND FLUOROSCOPIC-GUIDED CHOLECYSTOSTOMY TUBE PLACEMENT COMPARISON:  CT 04/25/2015 MEDICATIONS: Fentanyl 5.0 mcg IV; Versed 25 mg IV; The patient is currently admitted to the hospital and on intravenous antibiotics. Antibiotics were administered within an appropriate time frame prior to skin puncture. ANESTHESIA/SEDATION: Total Moderate Sedation Time Twenty minutes CONTRAST:  32mL OMNIPAQUE IOHEXOL 300 MG/ML  SOLN FLUOROSCOPY TIME:  2 minutes 6 seconds COMPLICATIONS: None PROCEDURE: Informed written consent was obtained from the patient after a discussion of the risks, benefits and alternatives to treatment. Questions regarding the procedure were encouraged and answered. A timeout was performed prior to the initiation of the procedure. The right upper abdominal quadrant was prepped and draped in the usual sterile fashion, and a sterile drape was applied covering the operative field. Maximum barrier sterile technique with sterile  gowns and gloves were used for the procedure. A timeout was performed prior to the initiation of the procedure. Local anesthesia was provided with 1% lidocaine with epinephrine. Ultrasound scanning of the right upper quadrant demonstrates a markedly dilated gallbladder. Of note, the patient reported pain with ultrasound imaging over the gallbladder. Utilizing a transhepatic approach, a 22 gauge needle was advanced into the gallbladder under direct ultrasound guidance. An ultrasound image was saved for documentation purposes. Appropriate intraluminal puncture was confirmed with the efflux of bile and advancement of an 0.018 wire into the gallbladder lumen. The needle was exchanged for an [place] set. A small amount of contrast was injected to confirm appropriate intraluminal positioning. Over a Benson wire, a 31.2-French Cook cholecystomy tube was advanced into the gallbladder fossa, coiled and locked. Bile was aspirated and a small amount of contrast was injected as several post procedural spot radiographic images were obtained in various obliquities. The catheter was secured to the skin with suture, connected to a drainage bag and a dressing was placed. The patient tolerated the procedure well without immediate post procedural complication. IMPRESSION: Status post image guided percutaneous cholecystostomy tube. Sample sent for analysis. Signed, Dulcy Fanny. Earleen Newport, DO [facility] Electronically Signed   By: Corrie Mckusick D.O.   On: 04/25/2015 12:46   Dg Chest Portable 1 View  04/25/2015  CLINICAL DATA:  Central line placement.  Initial encounter. EXAM: PORTABLE CHEST 1 VIEW COMPARISON:  Chest radiograph performed earlier today at 1:09 a.m. FINDINGS: The right IJ line is noted ending about the mid SVC. The lungs are hypoexpanded. Vascular crowding and vascular congestion are noted. Patchy bilateral opacities may reflect atelectasis or possibly mild  pneumonia. No definite pleural effusion or pneumothorax is seen. The cardiomediastinal silhouette is normal in  size. No acute osseous abnormalities are seen. Spinal stimulation leads are partially imaged. IMPRESSION: 1. Right IJ line noted ending about the mid SVC. 2. Lungs hypoexpanded. Vascular congestion noted. Patchy bilateral airspace opacities may reflect atelectasis or possibly mild pneumonia, more prominent than on the prior study. Electronically Signed   By: Garald Balding M.D.   On: 04/25/2015 07:11    Anti-infectives: Anti-infectives    Start     Dose/Rate Route Frequency Ordered Stop   04/25/15 1000  piperacillin-tazobactam (ZOSYN) IVPB 3.375 g     3.375 g 12.5 mL/hr over 240 Minutes Intravenous 3 times per day 04/25/15 0121     04/25/15 0900  fluconazole (DIFLUCAN) IVPB 400 mg  Status:  Discontinued     400 mg 100 mL/hr over 120 Minutes Intravenous Every 24 hours 04/25/15 0821 04/25/15 1333   04/25/15 0800  vancomycin (VANCOCIN) IVPB 1000 mg/200 mL premix  Status:  Discontinued     1,000 mg 200 mL/hr over 60 Minutes Intravenous Every 8 hours 04/25/15 0720 04/25/15 1333   04/25/15 0100  piperacillin-tazobactam (ZOSYN) IVPB 3.375 g     3.375 g 100 mL/hr over 30 Minutes Intravenous  Once 04/25/15 0056 04/25/15 0308      Assessment/Plan: s/p * No surgery found *  Cholecystitis with sepsis s/p percutaneous cholecystostomy insertion by IR  WBC improved Start clear liquid diet Drain will be in approx 6 to 8 weeks  LOS: 1 day    Alexander [name] A 04/26/2015

## 2015-04-26 NOTE — Progress Notes (Signed)
[facility] Progress Note Patient Name: Alexander [name] DOB: 1951-05-17 MRN: [id number]   Date of Service  04/26/2015  HPI/Events of Note  Hypokalemia and hypophosphatemia  eICU Interventions  Potassium and phos replaced        DETERDING,ELIZABETH 04/26/2015, 5:29 AM

## 2015-04-26 NOTE — H&P (Signed)
PULMONARY / CRITICAL CARE MEDICINE   Name: Alexander [name] MRN: [id number] DOB: 1950-06-16    ADMISSION DATE:  04/25/2015 CONSULTATION DATE:  04/25/15  REFERRING MD :  Dr. Betsey Holiday   CHIEF COMPLAINT:  Sepsis   HISTORY OF PRESENT ILLNESS:   64 y/o M with PMH of depression, arthritis, headaches, CAD s/p stent, HTN, HLD, post-polio syndrome, OSA, chronic pain, cervical spine stimulator who presented to [facility] on 11/24 via EMS with reports of fever to 101.8, abdominal pain, constipation & nausea / vomiting.  cholesystitis septic, perc drain placed.  SUBJECTIVE: no pressors since yesterday afternoon  VITAL SIGNS: BP 120/88 mmHg  Pulse 81  Temp(Src) 97.3 F (36.3 C) (Oral)  Resp 33  Ht 5\' 11"  (1.803 m)  Wt 83.5 kg (184 lb 1.4 oz)  BMI 25.69 kg/m2  SpO2 95%  HEMODYNAMICS: CVP:  [14 mmHg-15 mmHg] 14 mmHg  VENTILATOR SETTINGS:    INTAKE / OUTPUT: I/O last 3 completed shifts: In: 7515.5 [I.V.:6455.5; IV Piggyback:1060] Out: 2375 [Urine:2025; Drains:350]  PHYSICAL EXAMINATION: General:  wdwn adult male in NAD  Neuro:  AAOx4, MAE, intact HEENT:  jvd improved from flat day prior Cardiovascular:  s1s2 rrr, no m Lungs:  CTA  Abdomen:  Non-distended, bsx4 active,less tender, drain in place Musculoskeletal:  No acute deformities, atrophy to BLE (post-polio) Skin:  Warm/dry, no edema, rashes or lesions   LABS:  CBC  Recent Labs Lab 04/25/15 0045 04/25/15 0100 04/25/15 1051 04/26/15 0414  WBC 4.2  --  23.7* 18.9*  HGB 13.0 13.3 11.5* 10.9*  HCT 37.5* 39.0 33.0* 32.0*  PLT 165  --  142* 145*   Coag's  Recent Labs Lab 04/25/15 1051  APTT 42*  INR 1.36     BMET  Recent Labs Lab 04/25/15 0045 04/25/15 0100 04/25/15 1051 04/26/15 0414  NA 135 136 136 138  K 3.0* 3.0* 2.8* 3.0*  CL 103 102 108 111  CO2 19*  --  17* 21*  BUN 16 18 19 16   CREATININE 0.88 0.80 1.07 0.70  GLUCOSE 125* 123* 157* 112*   Electrolytes  Recent Labs Lab 04/25/15 0045  04/25/15 1051 04/26/15 0414  CALCIUM 8.5* 7.5* 7.9*  MG  --   --  2.5*  PHOS  --   --  1.8*   Sepsis Markers  Recent Labs Lab 04/25/15 0547 04/25/15 1051 04/25/15 1052 04/25/15 1400  LATICACIDVEN 3.19*  --  3.6* 2.9*  PROCALCITON  --  62.00  --   --    ABG No results for input(s): PHART, PCO2ART, PO2ART in the last 168 hours.   Liver Enzymes  Recent Labs Lab 04/25/15 0045 04/25/15 1051  AST 32 34  ALT 63 51  ALKPHOS 248* 190*  BILITOT 0.8 1.4*  ALBUMIN 2.9* 2.3*   Cardiac Enzymes  Recent Labs Lab 04/25/15 0045 04/25/15 1051  TROPONINI 0.08* 0.20*   Glucose  Recent Labs Lab 04/25/15 1019  GLUCAP 134*    Imaging Ir Perc Cholecystostomy  04/25/2015  INDICATION: Acute cholecystitis. Patient is septic, with pharmacologic pressure support. EXAM: ULTRASOUND AND FLUOROSCOPIC-GUIDED CHOLECYSTOSTOMY TUBE PLACEMENT COMPARISON:  CT 04/25/2015 MEDICATIONS: Fentanyl 5.0 mcg IV; Versed 25 mg IV; The patient is currently admitted to the hospital and on intravenous antibiotics. Antibiotics were administered within an appropriate time frame prior to skin puncture. ANESTHESIA/SEDATION: Total Moderate Sedation Time Twenty minutes CONTRAST:  23mL OMNIPAQUE IOHEXOL 300 MG/ML  SOLN FLUOROSCOPY TIME:  2 minutes 6 seconds COMPLICATIONS: None PROCEDURE: Informed written consent was obtained from the  patient after a discussion of the risks, benefits and alternatives to treatment. Questions regarding the procedure were encouraged and answered. A timeout was performed prior to the initiation of the procedure. The right upper abdominal quadrant was prepped and draped in the usual sterile fashion, and a sterile drape was applied covering the operative field. Maximum barrier sterile technique with sterile gowns and gloves were used for the procedure. A timeout was performed prior to the initiation of the procedure. Local anesthesia was provided with 1% lidocaine with epinephrine. Ultrasound  scanning of the right upper quadrant demonstrates a markedly dilated gallbladder. Of note, the patient reported pain with ultrasound imaging over the gallbladder. Utilizing a transhepatic approach, a 22 gauge needle was advanced into the gallbladder under direct ultrasound guidance. An ultrasound image was saved for documentation purposes. Appropriate intraluminal puncture was confirmed with the efflux of bile and advancement of an 0.018 wire into the gallbladder lumen. The needle was exchanged for an [place] set. A small amount of contrast was injected to confirm appropriate intraluminal positioning. Over a Benson wire, a 46.2-French Cook cholecystomy tube was advanced into the gallbladder fossa, coiled and locked. Bile was aspirated and a small amount of contrast was injected as several post procedural spot radiographic images were obtained in various obliquities. The catheter was secured to the skin with suture, connected to a drainage bag and a dressing was placed. The patient tolerated the procedure well without immediate post procedural complication. IMPRESSION: Status post image guided percutaneous cholecystostomy tube. Sample sent for analysis. Signed, Dulcy Fanny. Earleen Newport, DO [facility] Radiology Electronically Signed   By: Corrie Mckusick D.O.   On: 04/25/2015 12:46     STUDIES:  11/24  CT ABD/Pelvis >> inflammatory process in the RUQ with distended thick-walled gallbladder, bile duct dilation and infiltration in the adjacent fat.  No evidence of bowel perforation or obstruction. Fibrosis / inflammatory changes in lung bases.    CULTURES: 11/24  BCx2 >>gram neg rod 2/2>>>  11/24  UA >>  11/24  UC >>   ANTIBIOTICS: Vanco 11/24 >> 11/24 Zosyn 11/24 >>  Diflucan 11/24 >> 11/24  SIGNIFICANT EVENTS: 11/24  Admit with n/v, abd pain 11/24- perc drain, off pressors  LINES/TUBES: R IJ TLC 11/24 >>   DISCUSSION: 63 y/o M admitted 11/24 with  nausea / vomiting & abdominal pain.  CT abd/pelvis concerning for an inflammatory process with a distended thick-walled gallbladder.  Developed septic shock while in ER requiring levophed.    ASSESSMENT / PLAN:  PULMONARY A: At Risk Atelectasis  Post-Polio Syndrome Hx OSA - does not use CPAP, has mouth guard P:   IS as able  CARDIOVASCULAR A:  Septic Shock  Elevated Troponin - r/o ACS, suspect in setting of demand ischemia with sepsis  Hx CAD s/p stent, HTN, HLD No rel AI P:  Tele remain, trop 0.2 in setting SS and ARF, trop rise with crt rise Will need outpt cardiology for risk stratification Assess CVP Q4- dc Solu-cortef 50 mg IV Q6- dc See ID  Hold home lisinopril, lipitor, sys 120  RENAL A:    Hypokalemia, phos low P:   kvo k supp, supp phos bmet follow up  GASTROINTESTINAL A:   Nausea / Vomiting  P:   PRN zofran CCS managing diet, dc ppi  HEMATOLOGIC A:   DVT prevention  P:  Heparin for DVT prophylaxis , until ambualtion  INFECTIOUS A:   Fever  Nausea / Vomiting / ABD Pain -  acute cholecytsitis Septic Shock resolved P:   Follow cultures to ID gram neg Continued zosyn for now  ENDOCRINE A:   Hyperglycemia   P:   Monitor glucose on BMP  Assess TSH   NEUROLOGIC A:   Chronic Pain  Post-Polio  Migraines  Depression  P:   RASS goal: n/a  Monitor neuro status Restart neurontin, robaxin 11/24   FAMILY  - Updates: Updated at bedside by me  - Inter-disciplinary family meet or Palliative Care meeting due by: 12/1  To triad  Lavon Paganini. Titus Mould, MD, [place] Pgr: [facility] 04/26/2015 8:53 AM

## 2015-04-27 ENCOUNTER — Encounter (HOSPITAL_COMMUNITY): Payer: Self-pay | Admitting: Nurse Practitioner

## 2015-04-27 DIAGNOSIS — E876 Hypokalemia: Secondary | ICD-10-CM

## 2015-04-27 DIAGNOSIS — R7881 Bacteremia: Secondary | ICD-10-CM

## 2015-04-27 DIAGNOSIS — I252 Old myocardial infarction: Secondary | ICD-10-CM

## 2015-04-27 DIAGNOSIS — I214 Non-ST elevation (NSTEMI) myocardial infarction: Secondary | ICD-10-CM

## 2015-04-27 DIAGNOSIS — I4891 Unspecified atrial fibrillation: Secondary | ICD-10-CM

## 2015-04-27 DIAGNOSIS — I10 Essential (primary) hypertension: Secondary | ICD-10-CM | POA: Diagnosis present

## 2015-04-27 LAB — CULTURE, BODY FLUID-BOTTLE

## 2015-04-27 LAB — BASIC METABOLIC PANEL
ANION GAP: 8 (ref 5–15)
Anion gap: 9 (ref 5–15)
BUN: 16 mg/dL (ref 6–20)
BUN: 14 mg/dL (ref 6–20)
CALCIUM: 8 mg/dL — AB (ref 8.9–10.3)
CHLORIDE: 106 mmol/L (ref 101–111)
CO2: 22 mmol/L (ref 22–32)
CO2: 22 mmol/L (ref 22–32)
CREATININE: 0.65 mg/dL (ref 0.61–1.24)
Calcium: 8.1 mg/dL — ABNORMAL LOW (ref 8.9–10.3)
Chloride: 106 mmol/L (ref 101–111)
Creatinine, Ser: 0.6 mg/dL — ABNORMAL LOW (ref 0.61–1.24)
GFR calc Af Amer: >60 mL/min (ref >60)
GLUCOSE: 150 mg/dL — AB (ref 65–99)
Glucose, Bld: 97 mg/dL (ref 65–99)
POTASSIUM: 2.8 mmol/L — AB (ref 3.5–5.1)
Potassium: 2.9 mmol/L — ABNORMAL LOW (ref 3.5–5.1)
SODIUM: 137 mmol/L (ref 135–145)
SODIUM: 136 mmol/L (ref 135–145)

## 2015-04-27 LAB — HEPARIN LEVEL (UNFRACTIONATED)

## 2015-04-27 LAB — TROPONIN I
TROPONIN I: 3.86 ng/mL — AB (ref <0.031)
TROPONIN I: 2.98 ng/mL — AB (ref <0.031)
Troponin I: 5.21 ng/mL (ref <0.031)

## 2015-04-27 LAB — CULTURE, BODY FLUID W GRAM STAIN -BOTTLE

## 2015-04-27 MED ORDER — HEPARIN (PORCINE) IN NACL 100-0.45 UNIT/ML-% IJ SOLN
2300.0000 [IU]/h | INTRAMUSCULAR | Status: DC
  Administered 2015-04-27: 1100 [IU]/h via INTRAVENOUS
  Administered 2015-04-28: 1550 [IU]/h via INTRAVENOUS
  Administered 2015-04-28: 1900 [IU]/h via INTRAVENOUS
  Administered 2015-04-29: 2300 [IU]/h via INTRAVENOUS
  Administered 2015-04-29: 2200 [IU]/h via INTRAVENOUS
  Administered 2015-04-30: 2300 [IU]/h via INTRAVENOUS
  Filled 2015-04-27 (×6): qty 250

## 2015-04-27 MED ORDER — POTASSIUM CHLORIDE 10 MEQ/50ML IV SOLN
10.0000 meq | INTRAVENOUS | Status: DC
  Filled 2015-04-27 (×4): qty 50

## 2015-04-27 MED ORDER — AMIODARONE HCL IN DEXTROSE 360-4.14 MG/200ML-% IV SOLN
30.0000 mg/h | INTRAVENOUS | Status: DC
  Administered 2015-04-27 – 2015-04-29 (×3): 30 mg/h via INTRAVENOUS
  Filled 2015-04-27 (×7): qty 200

## 2015-04-27 MED ORDER — AMIODARONE HCL IN DEXTROSE 360-4.14 MG/200ML-% IV SOLN
60.0000 mg/h | INTRAVENOUS | Status: AC
  Administered 2015-04-27 (×2): 60 mg/h via INTRAVENOUS
  Filled 2015-04-27: qty 200

## 2015-04-27 MED ORDER — POTASSIUM CHLORIDE CRYS ER 20 MEQ PO TBCR
40.0000 meq | EXTENDED_RELEASE_TABLET | Freq: Once | ORAL | Status: AC
  Administered 2015-04-27: 40 meq via ORAL
  Filled 2015-04-27: qty 2

## 2015-04-27 MED ORDER — AMIODARONE LOAD VIA INFUSION
150.0000 mg | Freq: Once | INTRAVENOUS | Status: AC
  Administered 2015-04-27: 150 mg via INTRAVENOUS
  Filled 2015-04-27: qty 83.34

## 2015-04-27 MED ORDER — ASPIRIN 81 MG PO CHEW
81.0000 mg | CHEWABLE_TABLET | Freq: Every day | ORAL | Status: DC
  Administered 2015-04-27 – 2015-05-02 (×6): 81 mg via ORAL
  Filled 2015-04-27 (×6): qty 1

## 2015-04-27 MED ORDER — HEPARIN BOLUS VIA INFUSION
3000.0000 [IU] | Freq: Once | INTRAVENOUS | Status: DC
  Filled 2015-04-27: qty 3000

## 2015-04-27 MED ORDER — ATORVASTATIN CALCIUM 80 MG PO TABS
80.0000 mg | ORAL_TABLET | Freq: Every day | ORAL | Status: DC
  Administered 2015-04-28 – 2015-05-01 (×4): 80 mg via ORAL
  Filled 2015-04-27 (×4): qty 1

## 2015-04-27 MED ORDER — HEPARIN BOLUS VIA INFUSION
3000.0000 [IU] | Freq: Once | INTRAVENOUS | Status: AC
  Administered 2015-04-27: 3000 [IU] via INTRAVENOUS
  Filled 2015-04-27: qty 3000

## 2015-04-27 NOTE — Consult Note (Signed)
CARDIOLOGY CONSULT NOTE   Patient ID: Alexander [name] MRN: [id number], DOB/AGE: Oct 16, 1950   Admit date: 04/25/2015 Date of Consult: 04/27/2015   Primary Physician: [name], MD Primary Cardiologist: new  Pt. Profile  64 y/o male with a h/o CAD s/p prior stenting of unknown vessels in [facility], who was admitted 11/24 with sepsis and acute cholecystitis and has subsequently developed rapid afib and troponin elevation.  Problem List  Past Medical History  Diagnosis Date  . Arthritis   . History of depression   . Headache(784.0)   . CAD (coronary artery disease)     a. 1995 s/p BMS;  b. 2000 s/p BMS;  c. 2005 s/p stent - All stents in LV to unknown vessels.  . Essential hypertension   . HLD (hyperlipidemia)   . Migraines   . Neuropathy ([place])   . Post-polio syndrome     a. ambulates with braces.  . Shoulder pain, right   . OSA (obstructive sleep apnea)     does not want to use CPAP, using mouth guard  . Chronic pain syndrome     a. Followed @ [facility];  b. Uses 12-14 excedrins per day.    Past Surgical History  Procedure Laterality Date  . Coronary angioplasty with stent placement  1995, 2000, 2005  . Uvulopalatopharyngoplasty, tonsillectomy and septoplasty  2002  . Lumbar spine stimulator    . Cervical spine stimulator  08/2012  . Colonoscopy  09/2013    TA x2, rpt 5 yrs (Pyrtle)     Allergies  No Known Allergies  HPI   64 y/o male with the above complex PMH.  He has a h/o polio as a child with difficulty walking since (uses braces or walker).  He also has a h/o CAD s/p stenting in 1995, 2000, and 2005.  He has not had a stress test or cath since 2005.  He and his wife moved to [place] about 4 years ago.  From a cardiac standpoint, he has been doing well and can generally do the things that he likes to do w/o chest pain or dyspnea.  He was in his usoh until Sunday 11/20, when he developed abdominal discomfort, which he thought may have been 2/2  eating "bad salsa."  Ss worsened on 11/21 and 22 and became associated with nausea and anorexia.  He took some milk of mag on 11/23 and had a large BM, however Ss did not improve.  By the evening of 11/23, he was lethargic and confused and c/o of chills.  His wife called EMS.  He was found to be febrile and tachycardic.  He was taken to the [facility] ED where he became hypotensive.  Initial troponin was mildly elevated @ 0.08 and he did c/o mild "band-like" lower chest/upper abd discomfort, which he identifies as an anginal equivalent.  CT of the abd/pelvis revealed cholecystitis.  He was seen by [facility] and felt to be too high risk for surgery and subsequently underwent perc drain.  In the setting of sepsis with hypotension, he was also seen by CCM with central line placement and initiation of pressors and abx.  Pt stabilized and pressor were weaned by the PM of 11/24.  He has continued to have diffuse abdominal discomfort but overall has improved markedly. He has had no further "band-like" chest pain.  This AM @ approximately 3:40, he went from sinus rhythm with frequent PAC's to AFib with rates up to the 160's.  He has been asymptomatic.  We have been asked to eval.   Inpatient Medications  . amiodarone  150 mg Intravenous Once  . antiseptic oral rinse  7 mL Mouth Rinse q12n4p  . aspirin  81 mg Oral Daily  . atorvastatin  80 mg Oral q1800  . chlorhexidine  15 mL Mouth Rinse BID  . gabapentin  1,200 mg Oral TID  . piperacillin-tazobactam (ZOSYN)  IV  3.375 g Intravenous 3 times per day  . pneumococcal 13-valent conjugate vaccine  0.5 mL Intramuscular Tomorrow-1000    Family History Family History  Problem Relation Age of Onset  . Diabetes Mother   . Cancer Sister 28    male cancer  . Coronary artery disease Neg Hx   . Stroke Neg Hx   . Colon cancer Neg Hx   . Pancreatic cancer Neg Hx   . Stomach cancer Neg Hx   . Cancer Brother 65    breast     Social History Social History   Social  History  . Marital Status: Married    Spouse Name: N/A  . Number of Children: N/A  . Years of Education: N/A   Occupational History  . Not on file.   Social History Main Topics  . Smoking status: Never Smoker   . Smokeless tobacco: Never Used  . Alcohol Use: No  . Drug Use: No  . Sexual Activity: Not on file   Other Topics Concern  . 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  Cardiovascular:  +++ "band-like" chest pain, no dyspnea on exertion, edema, orthopnea, palpitations, paroxysmal nocturnal dyspnea. Dermatological: No rash, lesions/masses Respiratory: No cough, dyspnea Urologic: No hematuria, dysuria Abdominal:   +++ nausea and abd pain.  No vomiting, diarrhea, bright red blood per rectum, melena, or hematemesis Neurologic:  No visual changes, wkns, changes in mental status. All other systems reviewed and are otherwise negative except as noted above.  Physical Exam  Blood pressure 111/93, pulse 118, temperature 97.6 F (36.4 C), temperature source Oral, resp. rate 19, height 5\' 11"  (1.803 m), weight 187 lb 9.8 oz (85.1 kg), SpO2 95 %.  General: Pleasant, NAD Psych: Normal affect. Neuro: Alert and oriented X 3. Moves all extremities spontaneously. HEENT: Normal  Neck: Supple without bruits or JVD. Lungs:  Resp regular and unlabored, CTA. Heart: IR, IR, tachy, no s3, s4, or murmurs. Abdomen: Soft, diffusely tender, non-distended, BS + x 4. Perc drain in place on right. Extremities: No clubbing, cyanosis or edema. DP/PT/Radials 2+ and equal bilaterally.  Poor muscle development of  legs.  Labs   Recent Labs  04/25/15 0045 04/25/15 1051 04/27/15 0450  TROPONINI 0.08* 0.20* 5.21*   Lab Results  Component Value Date   WBC 18.9* 04/26/2015   HGB 10.9* 04/26/2015   HCT 32.0* 04/26/2015   MCV 93.8 04/26/2015   PLT 145* 04/26/2015    Recent Labs Lab 04/25/15 1051  04/27/15 0450  NA 136  < > 137  K 2.8*  < > 2.8*  CL 108  < > 106  CO2 17*  < > 22  BUN 19  < > 16  CREATININE 1.07  < > 0.65  CALCIUM 7.5*  < > 8.1*  PROT 5.5*  --   --   BILITOT 1.4*  --   --   ALKPHOS 190*  --   --   ALT 51  --   --   AST 34  --   --   GLUCOSE 157*  < > 97  < > = values in this interval not displayed. Lab Results  Component Value Date   CHOL 209* 07/05/2014   HDL 54.80 07/05/2014   LDLCALC 134* 07/05/2014   TRIG 101.0 07/05/2014   Radiology/Studies  Dg Chest 2 View  04/25/2015  CLINICAL DATA:  Initial evaluation for possible sepsis. EXAM: CHEST  2 VIEW COMPARISON:  None. FINDINGS: Examination is somewhat limited by technique and lordotic angulation. Cardiac and mediastinal silhouettes grossly within normal limits. Spinal stimulator electrodes noted. Lungs are hypoinflated. Mild right basilar atelectasis. There is diffuse peribronchial thickening with mild prominence of the interstitial markings, greatest within the left mid and lower lung. Finding is nonspecific, but may reflect sequela of bronchiolitis or possible atypical/interstitial pneumonia. No consolidative airspace opacity. No pulmonary edema or pleural effusion. No pneumothorax. No acute osseus abnormality. IMPRESSION: Diffuse peribronchial thickening with prominence of the interstitial markings, greatest within the mid and lower left lung. Finding is nonspecific but may reflect sequela of bronchiolitis or possible atypical/ interstitial pneumonia. No consolidative airspace disease. Electronically Signed   By: Jeannine Boga M.D.   On: 04/25/2015 01:40   Ct Abdomen Pelvis W Contrast  04/25/2015  CLINICAL  DATA:  Low abdominal pain and low chest pain since yesterday. Constipation on Tuesday and Wednesday. Fever. EXAM: CT ABDOMEN AND PELVIS WITH CONTRAST TECHNIQUE: Multidetector CT imaging of the abdomen and pelvis was performed using the standard protocol following bolus administration of intravenous contrast. CONTRAST:  166mL OMNIPAQUE IOHEXOL 300 MG/ML  SOLN COMPARISON:  None. FINDINGS: Interstitial fibrosis in the lung bases with ground-glass alveolitis particularly on the right. Calcified granulomas in the lung bases. Coronary artery calcifications. The gallbladder is distended. No stones are identified but there is evidence of wall thickening and edema with mild inflammatory reaction around the gallbladder. Mild intra and extrahepatic bile duct dilatation. No discrete stone or mass identified. There is infiltration around the distal aspect of the stomach and duodenal bulb region and around the head of the pancreas. This is nonspecific and could represent inflammatory change due to peptic ulcer disease, cholecystitis, or focal pancreatitis. The spleen, inferior vena cava, and retroperitoneal lymph nodes are unremarkable. Calcification of the aorta without aneurysm. Subcentimeter cysts in the right kidney. No hydronephrosis in the kidneys. Nodule in the left adrenal gland measures 18 mm diameter. Hounsfield unit measurements are indeterminate but statistically this most likely represents benign adenoma. Consider followup in 1 year with noncontrast CT. Stomach, small bowel, and colon are mostly decompressed. No free air or free fluid in the abdomen. Pelvis: Prostate gland is mildly enlarged. Bladder wall is not thickened. No free or loculated pelvic fluid collections. No pelvic mass or lymphadenopathy. Rectosigmoid colon is unremarkable. Appendix is not identified. Degenerative changes in the spine. Spondylolysis with mild spondylolisthesis at L5-S1. No destructive bone lesions. Spinal stimulator device. IMPRESSION:  Inflammatory process in the right upper quadrant with distended thick-walled gallbladder, bile duct dilatation, and infiltration in the adjacent fat. Etiology is nonspecific in could indicate cholecystitis, focal pancreatitis, or peptic ulcer disease. No evidence of bowel perforation or obstruction. Interstitial fibrosis and  inflammatory changes in the lung bases. Electronically Signed   By: Lucienne Capers M.D.   On: 04/25/2015 02:44   Ir Perc Cholecystostomy  04/25/2015  INDICATION: Acute cholecystitis. Patient is septic, with pharmacologic pressure support. EXAM: ULTRASOUND AND FLUOROSCOPIC-GUIDED CHOLECYSTOSTOMY TUBE PLACEMENT COMPARISON:  CT 04/25/2015 MEDICATIONS: Fentanyl 5.0 mcg IV; Versed 25 mg IV; The patient is currently admitted to the hospital and on intravenous antibiotics. Antibiotics were administered within an appropriate time frame prior to skin puncture. ANESTHESIA/SEDATION: Total Moderate Sedation Time Twenty minutes CONTRAST:  28mL OMNIPAQUE IOHEXOL 300 MG/ML  SOLN FLUOROSCOPY TIME:  2 minutes 6 seconds COMPLICATIONS: None PROCEDURE: Informed written consent was obtained from the patient after a discussion of the risks, benefits and alternatives to treatment. Questions regarding the procedure were encouraged and answered. A timeout was performed prior to the initiation of the procedure. The right upper abdominal quadrant was prepped and draped in the usual sterile fashion, and a sterile drape was applied covering the operative field. Maximum barrier sterile technique with sterile gowns and gloves were used for the procedure. A timeout was performed prior to the initiation of the procedure. Local anesthesia was provided with 1% lidocaine with epinephrine. Ultrasound scanning of the right upper quadrant demonstrates a markedly dilated gallbladder. Of note, the patient reported pain with ultrasound imaging over the gallbladder. Utilizing a transhepatic approach, a 22 gauge needle was advanced  into the gallbladder under direct ultrasound guidance. An ultrasound image was saved for documentation purposes. Appropriate intraluminal puncture was confirmed with the efflux of bile and advancement of an 0.018 wire into the gallbladder lumen. The needle was exchanged for an [place] set. A small amount of contrast was injected to confirm appropriate intraluminal positioning. Over a Benson wire, a 57.2-French Cook cholecystomy tube was advanced into the gallbladder fossa, coiled and locked. Bile was aspirated and a small amount of contrast was injected as several post procedural spot radiographic images were obtained in various obliquities. The catheter was secured to the skin with suture, connected to a drainage bag and a dressing was placed. The patient tolerated the procedure well without immediate post procedural complication. IMPRESSION: Status post image guided percutaneous cholecystostomy tube. Sample sent for analysis. Signed, Dulcy Fanny. Earleen Newport, DO [facility] Radiology Electronically Signed   By: Corrie Mckusick D.O.   On: 04/25/2015 12:46   Dg Chest Portable 1 View  04/25/2015  CLINICAL DATA:  Central line placement.  Initial encounter. EXAM: PORTABLE CHEST 1 VIEW COMPARISON:  Chest radiograph performed earlier today at 1:09 a.m. FINDINGS: The right IJ line is noted ending about the mid SVC. The lungs are hypoexpanded. Vascular crowding and vascular congestion are noted. Patchy bilateral opacities may reflect atelectasis or possibly mild pneumonia. No definite pleural effusion or pneumothorax is seen. The cardiomediastinal silhouette is normal in size. No acute osseous abnormalities are seen. Spinal stimulation leads are partially imaged. IMPRESSION: 1. Right IJ line noted ending about the mid SVC. 2. Lungs hypoexpanded. Vascular congestion noted. Patchy bilateral airspace opacities may reflect atelectasis or possibly mild pneumonia, more prominent than  on the prior study. Electronically Signed   By: Garald Balding M.D.   On: 04/25/2015 07:11    ECG  Afib, 126, anterior ST depression.  ASSESSMENT AND PLAN  1.  Acute cholecystitis:  S/p percutaneous drain by [facility] with plan for further eval in 6 wks.  Remains on abx.  No current plans for surgery.  I spoke with surgical PA and they are ok  with [place] using heparin/asa and pt will also need preop cardiac clearance.  2.  Sepsis/bacteremia:  BC returned with Gram + and Gram neg flora.  Abx per IM.  Hemodynamically stable.  3.  Type II NSTEMI/CAD:  Prior h/o CAD with stenting x 3 of unknown vessels in LV (1995, 2000, 2005).  Has not seen cardiology locally prior to today and last eval (MV & cath) would have been 2005.  He did c/o c/p which he identified as a possible anginal equivalent upon presentation to the ED, though also says with the amount of abdominal pain he was having, it was hard to tell.  Trop was mildly elevated on 11/24 but was found to be 5.21 this AM in the setting of rapid AF.  He was not having chest pain or dyspnea this AM and is currently feeling well and in good spirits, despite ongoing tachycardia in the setting of AF.  As above, I have discussed his case with surgery and medicine.  I will add ASA 81, heparin, and high potency statin (LFT's ok).  In the setting of relative hypotension up to this point, I will hold off on bb (adding amio for AF).  He will require and ischemic evaluation prior to his surgery in six wks.  Follow troponin and check echo.  If trop continues to rise and/or EF is down (we do not know prior EF), we will need to strongly consider cath once cleared from a bacteremia standpoint.  If nl EF and trop trends down, would likely plan lexiscan MV prior to d/c.  4.  Afib RVR:  New onset this AM @ ~ 3:40.  Asymptomatic, though rates into the 160's @ times.  BP stable.  In setting of above, I will load with IV amio and tx to stepdown for closer monitoring.  So long as  abdominal pain and bacteremia/sepsis are present, he is likely to have more AF.  Heparin as above in setting of NSTEMI.  CHA2DS2VASc = 3.  Not clear at this point if he will require long term anticoagulation.  Supplement K+.  TSH nl.  Mg nl yesterday morning.  5.  Hypokalemia:  Supp.  6.  H/O Essential HTN:  Currently stable.  Follow.  Was on ACEI @ home.  7. HL:  Resume statin in setting of NSTEMI.  Signed, Murray Hodgkins, NP 04/27/2015, 9:39 AM   Attending Note Patient seen and discussed with NP Nash Mantis, I agree with his documentation. 64 yo male history of CAD with prior stenting in [facility] (details are not complete), history of polio, HTN, HL, OSA, chronic pain admitted with fever, abdominal pain, and N/V. Found to have cholecystitis with septic shock with transient pressor requirment, had percutaneous drain placed. He developed afib with RVR and has been started on amio gtt and started on hep gtt for CHADS2Vasc score of 3. Trop initialyl on admit were mildly elevated but today have significantly increased to 5.2, have not peaked.   Echo pending CXR vascular congestion, patchy bilateral airspace opacities EKG afib, mild TWI and ST depressions anteroseptal leads in setting of tachycardia, new compared to Jan 2015 EKG  Patient with prior hx of CAD with multiple stents in the past admitted with cholecystitis and septic shock. Infection, hypotension, and tachycardia (both sinus tach and now afib with RVR) have created significant increased cardiac demand, however his degree of troponin elevation is quite significant for demand ischemia alone and he does have anterior TWI and depressions. I suspect combination of  chronic obstructive CAD in the setting of severe increase in demand. He will need further coronary evalatuation for possible intervention as well as risk stratification for cholecystectomy that is planned in 6 weeks. Given his CAD history and degree of troponin elevation would likely  favor cath. Agree with medical therapy today, we will f/u his echo, but would anticipate likely cath Monday if other medical issues remain stable. Anticipate short course of amio treatment, likely convert to AV nodal agents once medically more stable. Decision for long term anticoag will need to reevaluated at follow up.     Zandra Abts MD

## 2015-04-27 NOTE — Progress Notes (Signed)
ANTICOAGULATION CONSULT NOTE - Initial Consult  Pharmacy Consult for heparin Indication: chest pain/ACS  No Known Allergies  Patient Measurements: Height: 5\' 11"  (180.3 cm) Weight: 187 lb 9.8 oz (85.1 kg) IBW/kg (Calculated) : 75.3 Heparin Dosing Weight: 85kg  Vital Signs: Temp: 97.6 F (36.4 C) (11/26 0524) Temp Source: Oral (11/26 0524) BP: 111/93 mmHg (11/26 0524) Pulse Rate: 118 (11/26 0524)  Labs:  Recent Labs  04/25/15 0045 04/25/15 0100 04/25/15 1051 04/26/15 0414 04/27/15 0450  HGB 13.0 13.3 11.5* 10.9*  --   HCT 37.5* 39.0 33.0* 32.0*  --   PLT 165  --  142* 145*  --   APTT  --   --  42*  --   --   LABPROT  --   --  16.9*  --   --   INR  --   --  1.36  --   --   CREATININE 0.88 0.80 1.07 0.70 0.65  TROPONINI 0.08*  --  0.20*  --  5.21*    Estimated Creatinine Clearance: 98 mL/min (by C-G formula based on Cr of 0.65).   Assessment: 44 YOM here acute cholecystitis s/p perc chole. Now with positive troponins and in AFib. To start IV heparin. Was on prophylactic subq heparin- last dose this morning ~0530. Not on anticoagulation PTA.  Baseline Hgb 10.9, plts 145. No overt bleeding noted from surgical site.    Goal of Therapy:  Heparin level 0.3-0.7 units/ml Monitor platelets by anticoagulation protocol: Yes   Plan:  -heparin bolus with 3000 units IV x1, then start infusion at 1100 units/hr -heparin level in 6 hours -daily HL and CBC -follow for cardiology plans -follow for s/s bleeding  Olon Russ D. Jacaden Forbush, PharmD, BCPS Clinical Pharmacist Pager: [phone] 04/27/2015 9:40 AM

## 2015-04-27 NOTE — Progress Notes (Signed)
[place] Progress Note Patient Name: Alexander [name] DOB: [birth year] MRN: [id number]   Date of Service  04/27/2015  HPI/Events of Note  Call from nurse reporting AF with rate in the 110s to 120s.  Patient is alert, HD stable and in NAD.  No documented h/o of AF.  eICU Interventions  Plan: 12 lead EKG Cycle trop     Intervention Category Intermediate Interventions: Arrhythmia - evaluation and management  Annalee Meyerhoff 04/27/2015, 4:34 AM

## 2015-04-27 NOTE — Progress Notes (Signed)
Patient has been having irregular heart beats/A-fib with HR ranging from 118 - 125.  Patient is on telemetry.  Patient is asymptomatic, alert and verbal.  On call contacted.  New orders for 12 lead EKG and Troponin levels drawn.  Current HR is going up to 145.  Patient denies pain, still alert and verbal.

## 2015-04-27 NOTE — Progress Notes (Signed)
PATIENT DETAILS Name: Alexander [name] Age: 64 y.o. Sex: male Date of Birth: [birth year] Admit Date: 04/25/2015 Admitting Physician Raylene Miyamoto, MD [id number] [name], MD  Brief narrative: 64 year old male with history of poliomyelitis-CAD status post remote PCI-admitted with acute cholecystitis and septic shock. Admitted to the intensive care unit, started on pressors and broad-spectrum antibiotics. Seen by general surgery, subsequently had percutaneous cholecystostomy drain placed. Once improved, transferred to the hospitalist service on 11/27. Hospital course has been complicated by development of atrial fibrillation with RVR and non-STEMI.  Subjective: No further abdominal pain-no nausea or vomiting.  Assessment/Plan: Active Problems: Septic shock: Secondary to acute cholecystitis. Required pressors on admission. With antibiotics and supportive care, sepsis pathophysiology has now resolved.  Acute acalculous cholecystitis with Escherichia coli bacteremia: Seen by general surgery, recommendations were to place a percutaneous drain-this was subsequently done on 11/24. Leukocytosis slowly down trending-blood cultures positive for Escherichia coli-sensitivity pending. Continue Zosyn, and narrow antibiotics depending on sensitivity results. Repeat blood cultures.  A. fib RVR: Cardiology consulted,started on amiodarone infusion for rate control, and on heparin infusion CHADS2Vasc score of 3. Echocardiogram pending.  Non-STEMI: Has underlying CAD-suspect that A. fib RVR/hypotension probably precipitated non-STEMI. Continue aspirin, statin, IV heparin. Cardiology contemplating cardiac catheterization vs Nuc stress test at some point-defer management to cardiology..  Essential hypertension: Blood pressure controlled without antihypertensives-resume lisinopril when able  GERD: Continue PPI  History of poliomyelitis with postpolio syndrome: Significant muscle atrophy in  bilateral lower extremities. Continue Neurontin. PT eval when more stable. Normally ambulates with basis.  Hypokalemia: Given 40 mEq of potassium 2-recheck in am.  Chronic pain syndrome/chronic headaches: Currently stable-takes Excedrin Migraine (up to 12 times daily!!).   Disposition: Remain inpatient  Antimicrobial agents  See below  Anti-infectives    Start     Dose/Rate Route Frequency Ordered Stop   04/25/15 1000  piperacillin-tazobactam (ZOSYN) IVPB 3.375 g     3.375 g 12.5 mL/hr over 240 Minutes Intravenous 3 times per day 04/25/15 0121     04/25/15 0900  fluconazole (DIFLUCAN) IVPB 400 mg  Status:  Discontinued     400 mg 100 mL/hr over 120 Minutes Intravenous Every 24 hours 04/25/15 0821 04/25/15 1333   04/25/15 0800  vancomycin (VANCOCIN) IVPB 1000 mg/200 mL premix  Status:  Discontinued     1,000 mg 200 mL/hr over 60 Minutes Intravenous Every 8 hours 04/25/15 0720 04/25/15 1333   04/25/15 0100  piperacillin-tazobactam (ZOSYN) IVPB 3.375 g     3.375 g 100 mL/hr over 30 Minutes Intravenous  Once 04/25/15 0056 04/25/15 0308      DVT Prophylaxis: IV Heparin gtt  Code Status: Full code   Family Communication Family member at bedside  Procedures: 11/24>>biliary perc drain  CONSULTS:  cardiology, pulmonary/intensive care and general surgery  Time spent 30 minutes-Greater than 50% of this time was spent in counseling, explanation of diagnosis, planning of further management, and coordination of care.  MEDICATIONS: Scheduled Meds: . antiseptic oral rinse  7 mL Mouth Rinse q12n4p  . aspirin  81 mg Oral Daily  . atorvastatin  80 mg Oral q1800  . chlorhexidine  15 mL Mouth Rinse BID  . gabapentin  1,200 mg Oral TID  . piperacillin-tazobactam (ZOSYN)  IV  3.375 g Intravenous 3 times per day  . pneumococcal 13-valent conjugate vaccine  0.5 mL Intramuscular Tomorrow-1000  . potassium chloride  40 mEq Oral Once   Continuous Infusions: .  amiodarone 60 mg/hr  (04/27/15 1219)   Followed by  . amiodarone    . heparin 1,100 Units/hr (04/27/15 1213)   PRN Meds:.sodium chloride, sodium chloride, acetaminophen, fentaNYL (SUBLIMAZE) injection, methocarbamol, ondansetron (ZOFRAN) IV    PHYSICAL EXAM: Vital signs in last 24 hours: Filed Vitals:   04/26/15 1356 04/26/15 1643 04/26/15 2156 04/27/15 0524  BP: 134/88 132/85 142/90 111/93  Pulse: 89 88 100 118  Temp: 98 F (36.7 C) 98.2 F (36.8 C) 97.5 F (36.4 C) 97.6 F (36.4 C)  TempSrc: Oral Oral Oral Oral  Resp: 20 20 20 19   Height:      Weight:    85.1 kg (187 lb 9.8 oz)  SpO2: 95% 92% 94% 95%    Weight change: 1.6 kg (3 lb 8.4 oz) Filed Weights   04/25/15 0042 04/26/15 0138 04/27/15 0524  Weight: 83.915 kg (185 lb) 83.5 kg (184 lb 1.4 oz) 85.1 kg (187 lb 9.8 oz)   Body mass index is 26.18 kg/(m^2).   Gen Exam: Awake and alert with clear speech.  Neck: Supple, No JVD.   Chest: B/L Clear.   CVS: S1 S2 Regular, no murmurs.  Abdomen: soft, BS +, non tender, non distended.  Extremities: no edema, lower extremities warm to touch. Neurologic: Non Focal.   Skin: No Rash.   Wounds: N/A.    Intake/Output from previous day:  Intake/Output Summary (Last 24 hours) at 04/27/15 1327 Last data filed at 04/27/15 0900  Gross per 24 hour  Intake 6131.83 ml  Output   1425 ml  Net 4706.83 ml     LAB RESULTS: CBC  Recent Labs Lab 04/25/15 0045 04/25/15 0100 04/25/15 1051 04/26/15 0414  WBC 4.2  --  23.7* 18.9*  HGB 13.0 13.3 11.5* 10.9*  HCT 37.5* 39.0 33.0* 32.0*  PLT 165  --  142* 145*  MCV 93.8  --  94.0 93.8  MCH 32.5  --  32.8 32.0  MCHC 34.7  --  34.8 34.1  RDW 13.2  --  13.7 14.0  LYMPHSABS 0.3*  --   --   --   MONOABS 0.1  --   --   --   EOSABS 0.0  --   --   --   BASOSABS 0.0  --   --   --     Chemistries   Recent Labs Lab 04/25/15 0045 04/25/15 0100 04/25/15 1051 04/26/15 0414 04/27/15 0450  NA 135 136 136 138 137  K 3.0* 3.0* 2.8* 3.0* 2.8*  CL 103  102 108 111 106  CO2 19*  --  17* 21* 22  GLUCOSE 125* 123* 157* 112* 97  BUN 16 18 19 16 16   CREATININE 0.88 0.80 1.07 0.70 0.65  CALCIUM 8.5*  --  7.5* 7.9* 8.1*  MG  --   --   --  2.5*  --     CBG:  Recent Labs Lab 04/25/15 1019  GLUCAP 134*    GFR Estimated Creatinine Clearance: 98 mL/min (by C-G formula based on Cr of 0.65).  Coagulation profile  Recent Labs Lab 04/25/15 1051  INR 1.36    Cardiac Enzymes  Recent Labs Lab 04/25/15 1051 04/27/15 0450 04/27/15 1041  TROPONINI 0.20* 5.21* 3.86*    Invalid input(s): POCBNP No results for input(s): DDIMER in the last 72 hours. No results for input(s): HGBA1C in the last 72 hours. No results for input(s): CHOL, HDL, LDLCALC, TRIG, CHOLHDL, LDLDIRECT in the last 72 hours.  Recent Labs  04/25/15 1050  TSH 0.529   No results for input(s): VITAMINB12, FOLATE, FERRITIN, TIBC, IRON, RETICCTPCT in the last 72 hours.  Recent Labs  04/25/15 0045  LIPASE 24    Urine Studies No results for input(s): UHGB, CRYS in the last 72 hours.  Invalid input(s): UACOL, UAPR, USPG, UPH, UTP, UGL, UKET, UBIL, UNIT, UROB, ULEU, UEPI, UWBC, URBC, UBAC, CAST, UCOM, BILUA  MICROBIOLOGY: Recent Results (from the past 240 hour(s))  Culture, blood (routine x 2)     Status: None (Preliminary result)   Collection Time: 04/25/15 12:45 AM  Result Value Ref Range Status   Specimen Description BLOOD LEFT ARM  Final   Special Requests BOTTLES DRAWN AEROBIC AND ANAEROBIC 5ML  Final   Culture  Setup Time   Final    GRAM NEGATIVE RODS IN BOTH AEROBIC AND ANAEROBIC BOTTLES CRITICAL RESULT CALLED TO, READ BACK BY AND VERIFIED WITH: L FLOOD 04/25/15 @ 8 M VESTAL    Culture ESCHERICHIA COLI SUSCEPTIBILITIES TO FOLLOW   Final   Report Status PENDING  Incomplete  Culture, blood (routine x 2)     Status: None (Preliminary result)   Collection Time: 04/25/15 12:53 AM  Result Value Ref Range Status   Specimen Description BLOOD RIGHT ARM   Final   Special Requests BOTTLES DRAWN AEROBIC AND ANAEROBIC 5CC  Final   Culture  Setup Time   Final    GRAM NEGATIVE RODS IN BOTH AEROBIC AND ANAEROBIC BOTTLES CRITICAL RESULT CALLED TO, READ BACK BY AND VERIFIED WITH: L FLOOD 04/25/15 @ 33 M VESTAL    Culture ESCHERICHIA COLI SUSCEPTIBILITIES TO FOLLOW   Final   Report Status PENDING  Incomplete  Urine culture     Status: None   Collection Time: 04/25/15  1:03 AM  Result Value Ref Range Status   Specimen Description URINE, CLEAN CATCH  Final   Special Requests NONE  Final   Culture 4,000 COLONIES/mL INSIGNIFICANT GROWTH  Final   Report Status 04/26/2015 FINAL  Final  MRSA PCR Screening     Status: None   Collection Time: 04/25/15 10:36 AM  Result Value Ref Range Status   MRSA by PCR NEGATIVE NEGATIVE Final    Comment:        The GeneXpert MRSA Assay (FDA approved for NASAL specimens only), is one component of a comprehensive MRSA colonization surveillance program. It is not intended to diagnose MRSA infection nor to guide or monitor treatment for MRSA infections.   Culture, body fluid-bottle     Status: None   Collection Time: 04/25/15 12:21 PM  Result Value Ref Range Status   Specimen Description BILE  Final   Special Requests NONE  Final   Gram Stain MULTIPLE SPECIES PRESENT  Final   Culture MULTIPLE ORGANISMS PRESENT, NONE PREDOMINANT  Final   Report Status 04/27/2015 FINAL  Final  Gram stain     Status: None   Collection Time: 04/25/15 12:21 PM  Result Value Ref Range Status   Specimen Description BILE  Final   Special Requests NONE  Final   Gram Stain   Final    DEGENERATED CELLULAR MATERIAL PRESENT ABUNDANT GRAM POSITIVE AND GRAM NEGATIVE BACTERIA PRESENT CONSISTENT WITH LOWER GI FLORA RARE WBC PRESENT,BOTH PMN AND MONONUCLEAR NO YEAST OR FUNGAL ELEMENTS SEEN    Report Status 04/25/2015 FINAL  Final    RADIOLOGY STUDIES/RESULTS: Dg Chest 2 View  04/25/2015  CLINICAL DATA:  Initial evaluation for  possible sepsis. EXAM: CHEST  2 VIEW COMPARISON:  None. FINDINGS: Examination is somewhat limited by technique and lordotic angulation. Cardiac and mediastinal silhouettes grossly within normal limits. Spinal stimulator electrodes noted. Lungs are hypoinflated. Mild right basilar atelectasis. There is diffuse peribronchial thickening with mild prominence of the interstitial markings, greatest within the left mid and lower lung. Finding is nonspecific, but may reflect sequela of bronchiolitis or possible atypical/interstitial pneumonia. No consolidative airspace opacity. No pulmonary edema or pleural effusion. No pneumothorax. No acute osseus abnormality. IMPRESSION: Diffuse peribronchial thickening with prominence of the interstitial markings, greatest within the mid and lower left lung. Finding is nonspecific but may reflect sequela of bronchiolitis or possible atypical/ interstitial pneumonia. No consolidative airspace disease. Electronically Signed   By: Jeannine Boga M.D.   On: 04/25/2015 01:40   Ct Abdomen Pelvis W Contrast  04/25/2015  CLINICAL DATA:  Low abdominal pain and low chest pain since yesterday. Constipation on Tuesday and Wednesday. Fever. EXAM: CT ABDOMEN AND PELVIS WITH CONTRAST TECHNIQUE: Multidetector CT imaging of the abdomen and pelvis was performed using the standard protocol following bolus administration of intravenous contrast. CONTRAST:  1103mL OMNIPAQUE IOHEXOL 300 MG/ML  SOLN COMPARISON:  None. FINDINGS: Interstitial fibrosis in the lung bases with ground-glass alveolitis particularly on the right. Calcified granulomas in the lung bases. Coronary artery calcifications. The gallbladder is distended. No stones are identified but there is evidence of wall thickening and edema with mild inflammatory reaction around the gallbladder. Mild intra and extrahepatic bile duct dilatation. No discrete stone or mass identified. There is infiltration around the distal aspect of the stomach  and duodenal bulb region and around the head of the pancreas. This is nonspecific and could represent inflammatory change due to peptic ulcer disease, cholecystitis, or focal pancreatitis. The spleen, inferior vena cava, and retroperitoneal lymph nodes are unremarkable. Calcification of the aorta without aneurysm. Subcentimeter cysts in the right kidney. No hydronephrosis in the kidneys. Nodule in the left adrenal gland measures 18 mm diameter. Hounsfield unit measurements are indeterminate but statistically this most likely represents benign adenoma. Consider followup in 1 year with noncontrast CT. Stomach, small bowel, and colon are mostly decompressed. No free air or free fluid in the abdomen. Pelvis: Prostate gland is mildly enlarged. Bladder wall is not thickened. No free or loculated pelvic fluid collections. No pelvic mass or lymphadenopathy. Rectosigmoid colon is unremarkable. Appendix is not identified. Degenerative changes in the spine. Spondylolysis with mild spondylolisthesis at L5-S1. No destructive bone lesions. Spinal stimulator device. IMPRESSION: Inflammatory process in the right upper quadrant with distended thick-walled gallbladder, bile duct dilatation, and infiltration in the adjacent fat. Etiology is nonspecific in could indicate cholecystitis, focal pancreatitis, or peptic ulcer disease. No evidence of bowel perforation or obstruction. Interstitial fibrosis and inflammatory changes in the lung bases. Electronically Signed   By: Lucienne Capers M.D.   On: 04/25/2015 02:44   Ir Perc Cholecystostomy  04/25/2015  INDICATION: Acute cholecystitis. Patient is septic, with pharmacologic pressure support. EXAM: ULTRASOUND AND FLUOROSCOPIC-GUIDED CHOLECYSTOSTOMY TUBE PLACEMENT COMPARISON:  CT 04/25/2015 MEDICATIONS: Fentanyl 5.0 mcg IV; Versed 25 mg IV; The patient is currently admitted to the hospital and on intravenous antibiotics. Antibiotics were administered within an appropriate time frame  prior to skin puncture. ANESTHESIA/SEDATION: Total Moderate Sedation Time Twenty minutes CONTRAST:  24mL OMNIPAQUE IOHEXOL 300 MG/ML  SOLN FLUOROSCOPY TIME:  2 minutes 6 seconds COMPLICATIONS: None PROCEDURE: Informed written consent was obtained from the patient after a discussion of the risks, benefits and alternatives to treatment. Questions regarding the procedure were encouraged and  answered. A timeout was performed prior to the initiation of the procedure. The right upper abdominal quadrant was prepped and draped in the usual sterile fashion, and a sterile drape was applied covering the operative field. Maximum barrier sterile technique with sterile gowns and gloves were used for the procedure. A timeout was performed prior to the initiation of the procedure. Local anesthesia was provided with 1% lidocaine with epinephrine. Ultrasound scanning of the right upper quadrant demonstrates a markedly dilated gallbladder. Of note, the patient reported pain with ultrasound imaging over the gallbladder. Utilizing a transhepatic approach, a 22 gauge needle was advanced into the gallbladder under direct ultrasound guidance. An ultrasound image was saved for documentation purposes. Appropriate intraluminal puncture was confirmed with the efflux of bile and advancement of an 0.018 wire into the gallbladder lumen. The needle was exchanged for an [place] set. A small amount of contrast was injected to confirm appropriate intraluminal positioning. Over a [place] wire, a 41.2-[place] cholecystomy tube was advanced into the gallbladder fossa, coiled and locked. Bile was aspirated and a small amount of contrast was injected as several post procedural spot radiographic images were obtained in various obliquities. The catheter was secured to the skin with suture, connected to a drainage bag and a dressing was placed. The patient tolerated the procedure well without immediate post procedural complication. IMPRESSION: Status  post image guided percutaneous cholecystostomy tube. Sample sent for analysis. Signed, Dulcy Fanny. Earleen Newport, DO [facility] Electronically Signed   By: Corrie Mckusick D.O.   On: 04/25/2015 12:46   Dg Chest Portable 1 View  04/25/2015  CLINICAL DATA:  Central line placement.  Initial encounter. EXAM: PORTABLE CHEST 1 VIEW COMPARISON:  Chest radiograph performed earlier today at 1:09 a.m. FINDINGS: The right IJ line is noted ending about the mid SVC. The lungs are hypoexpanded. Vascular crowding and vascular congestion are noted. Patchy bilateral opacities may reflect atelectasis or possibly mild pneumonia. No definite pleural effusion or pneumothorax is seen. The cardiomediastinal silhouette is normal in size. No acute osseous abnormalities are seen. Spinal stimulation leads are partially imaged. IMPRESSION: 1. Right IJ line noted ending about the mid SVC. 2. Lungs hypoexpanded. Vascular congestion noted. Patchy bilateral airspace opacities may reflect atelectasis or possibly mild pneumonia, more prominent than on the prior study. Electronically Signed   By: Garald Balding M.D.   On: 04/25/2015 07:11    Oren Binet, MD  [facility] Pager:[phone]  If 7PM-7AM, please contact night-coverage [email or web address] Password [email or web address] 04/27/2015, 1:27 PM   LOS: 2 days

## 2015-04-27 NOTE — Progress Notes (Signed)
ANTICOAGULATION CONSULT NOTE - Follow Up Consult  Pharmacy Consult for heparin Indication: chest pain/ACS  No Known Allergies  Patient Measurements: Height: 5\' 11"  (180.3 cm) Weight: 187 lb 9.8 oz (85.1 kg) IBW/kg (Calculated) : 75.3 Heparin Dosing Weight: 85 kg  Vital Signs: Temp: 97.5 F (36.4 C) (11/26 1604) Temp Source: Oral (11/26 1604)  Labs:  Recent Labs  04/25/15 0045 04/25/15 0100 04/25/15 1051 04/26/15 0414 04/27/15 0450 04/27/15 1041 04/27/15 1632  HGB 13.0 13.3 11.5* 10.9*  --   --   --   HCT 37.5* 39.0 33.0* 32.0*  --   --   --   PLT 165  --  142* 145*  --   --   --   APTT  --   --  42*  --   --   --   --   LABPROT  --   --  16.9*  --   --   --   --   INR  --   --  1.36  --   --   --   --   HEPARINUNFRC  --   --   --   --   --   --  <0.10*  CREATININE 0.88 0.80 1.07 0.70 0.65  --  0.60*  TROPONINI 0.08*  --  0.20*  --  5.21* 3.86* 2.98*    Estimated Creatinine Clearance: 98 mL/min (by C-G formula based on Cr of 0.6).   Medications:  Scheduled:  . antiseptic oral rinse  7 mL Mouth Rinse q12n4p  . aspirin  81 mg Oral Daily  . atorvastatin  80 mg Oral q1800  . chlorhexidine  15 mL Mouth Rinse BID  . gabapentin  1,200 mg Oral TID  . piperacillin-tazobactam (ZOSYN)  IV  3.375 g Intravenous 3 times per day  . pneumococcal 13-valent conjugate vaccine  0.5 mL Intramuscular Tomorrow-1000   Infusions:  . amiodarone    . heparin 1,100 Units/hr (04/27/15 1213)    Assessment: 64 yo male with ACS is currently on subtherapeutic heparin.  Heparin level is <0.1 (RN said it was started at ~1200, but I don't expect it being therapeutic if the level was drawn on time) Goal of Therapy:  Heparin level 0.3-0.7 units/ml Monitor platelets by anticoagulation protocol: Yes   Plan:  - Increase heparin to 1300 units/hr.  - 6hr heparin level  Kyeisha Janowicz, Tsz-Yin 04/27/2015,6:13 PM

## 2015-04-27 NOTE — Progress Notes (Signed)
On call notified of troponin level and 12 lead EKG.  On call stated "will follow up".

## 2015-04-27 NOTE — Progress Notes (Signed)
Referring Physician(s): Dr Marlou Starks  Chief Complaint:  Percutaneous cholecystostomy placed in IR 11/24   Subjective:  Abdominal pain --resolving afeb Output of perc chole drain is good  Pt has new finding of Afib---being transferred to Heart floor  Allergies: Review of patient's allergies indicates no known allergies.  Medications: Prior to Admission medications   Medication Sig Start Date End Date Taking? Authorizing Provider  aspirin EC 325 MG tablet Take 975 mg by mouth 2 (two) times daily.   Yes Historical Provider, MD  aspirin-acetaminophen-caffeine (EXCEDRIN MIGRAINE) [phone] MG per tablet Take 1-2 tablets by mouth every 8 (eight) hours as needed for headache. 12 daily 03/05/11  Yes [name], MD  atorvastatin (LIPITOR) 40 MG tablet TAKE 1 TABLET DAILY AT 6 P.M. 08/03/14  Yes [name], MD  Cholecalciferol ([employer] VITAMIN D) 2000 UNITS CAPS Take 1 capsule (2,000 Units total) by mouth daily. 03/29/13  Yes [name], MD  gabapentin (NEURONTIN) 600 MG tablet TAKE 2 TABLETS THREE TIMES A DAY 03/11/15  Yes [name], MD  ibuprofen (ADVIL,MOTRIN) 200 MG tablet Take 400 mg by mouth every 6 (six) hours as needed for moderate pain.    Yes Historical Provider, MD  lisinopril (PRINIVIL,ZESTRIL) 10 MG tablet TAKE 1 TABLET DAILY 06/12/14  Yes [name], MD  methocarbamol (ROBAXIN) 500 MG tablet TAKE 1 TABLET THREE TIMES A DAY AS NEEDED FOR MUSCLE SPASM 10/04/14  Yes [name], MD  Multiple Vitamins-Minerals (MULTIVITAMIN ADULTS 50+ PO) Take 1 tablet by mouth daily.   Yes Historical Provider, MD  Omega-3 Fatty Acids (FISH OIL) 1000 MG CAPS Take 1 capsule by mouth daily.   Yes Historical Provider, MD  omeprazole (PRILOSEC) 40 MG capsule TAKE 1 CAPSULE DAILY 11/26/14  Yes [name], MD  sildenafil (VIAGRA) 100 MG tablet Take 0.5-1 tablets (50-100 mg total) by mouth daily as needed for erectile dysfunction. 01/03/15  Yes [name], MD  vitamin C  (ASCORBIC ACID) 500 MG tablet Take 1,000 mg by mouth daily.   Yes Historical Provider, MD  vitamin E 400 UNIT capsule Take 400 Units by mouth daily.   Yes Historical Provider, MD     Vital Signs: BP 111/93 mmHg  Pulse 118  Temp(Src) 97.6 F (36.4 C) (Oral)  Resp 19  Ht 5\' 11"  (1.803 m)  Wt 187 lb 9.8 oz (85.1 kg)  BMI 26.18 kg/m2  SpO2 95%  Physical Exam  Constitutional: He is oriented to person, place, and time.  Abdominal: Soft. Bowel sounds are normal. There is no tenderness.  Site of drain NT; no bleeding No sign of infection Output bilious 275 cc yesterday 40 cc in bag now Cx: multiple organisms afeb  Neurological: He is alert and oriented to person, place, and time.  Skin: Skin is warm and dry.  Psychiatric: He has a normal mood and affect. His behavior is normal. Judgment and thought content normal.  Nursing note and vitals reviewed.   Imaging: Dg Chest 2 View  04/25/2015  CLINICAL DATA:  Initial evaluation for possible sepsis. EXAM: CHEST  2 VIEW COMPARISON:  None. FINDINGS: Examination is somewhat limited by technique and lordotic angulation. Cardiac and mediastinal silhouettes grossly within normal limits. Spinal stimulator electrodes noted. Lungs are hypoinflated. Mild right basilar atelectasis. There is diffuse peribronchial thickening with mild prominence of the interstitial markings, greatest within the left mid and lower lung. Finding is nonspecific, but may reflect sequela of bronchiolitis or possible atypical/interstitial pneumonia. No consolidative airspace opacity. No pulmonary edema or pleural  effusion. No pneumothorax. No acute osseus abnormality. IMPRESSION: Diffuse peribronchial thickening with prominence of the interstitial markings, greatest within the mid and lower left lung. Finding is nonspecific but may reflect sequela of bronchiolitis or possible atypical/ interstitial pneumonia. No consolidative airspace disease. Electronically Signed   By: Jeannine Boga M.D.   On: 04/25/2015 01:40   Ct Abdomen Pelvis W Contrast  04/25/2015  CLINICAL DATA:  Low abdominal pain and low chest pain since yesterday. Constipation on Tuesday and Wednesday. Fever. EXAM: CT ABDOMEN AND PELVIS WITH CONTRAST TECHNIQUE: Multidetector CT imaging of the abdomen and pelvis was performed using the standard protocol following bolus administration of intravenous contrast. CONTRAST:  157mL OMNIPAQUE IOHEXOL 300 MG/ML  SOLN COMPARISON:  None. FINDINGS: Interstitial fibrosis in the lung bases with ground-glass alveolitis particularly on the right. Calcified granulomas in the lung bases. Coronary artery calcifications. The gallbladder is distended. No stones are identified but there is evidence of wall thickening and edema with mild inflammatory reaction around the gallbladder. Mild intra and extrahepatic bile duct dilatation. No discrete stone or mass identified. There is infiltration around the distal aspect of the stomach and duodenal bulb region and around the head of the pancreas. This is nonspecific and could represent inflammatory change due to peptic ulcer disease, cholecystitis, or focal pancreatitis. The spleen, inferior vena cava, and retroperitoneal lymph nodes are unremarkable. Calcification of the aorta without aneurysm. Subcentimeter cysts in the right kidney. No hydronephrosis in the kidneys. Nodule in the left adrenal gland measures 18 mm diameter. Hounsfield unit measurements are indeterminate but statistically this most likely represents benign adenoma. Consider followup in 1 year with noncontrast CT. Stomach, small bowel, and colon are mostly decompressed. No free air or free fluid in the abdomen. Pelvis: Prostate gland is mildly enlarged. Bladder wall is not thickened. No free or loculated pelvic fluid collections. No pelvic mass or lymphadenopathy. Rectosigmoid colon is unremarkable. Appendix is not identified. Degenerative changes in the spine. Spondylolysis with  mild spondylolisthesis at L5-S1. No destructive bone lesions. Spinal stimulator device. IMPRESSION: Inflammatory process in the right upper quadrant with distended thick-walled gallbladder, bile duct dilatation, and infiltration in the adjacent fat. Etiology is nonspecific in could indicate cholecystitis, focal pancreatitis, or peptic ulcer disease. No evidence of bowel perforation or obstruction. Interstitial fibrosis and inflammatory changes in the lung bases. Electronically Signed   By: Lucienne Capers M.D.   On: 04/25/2015 02:44   Ir Perc Cholecystostomy  04/25/2015  INDICATION: Acute cholecystitis. Patient is septic, with pharmacologic pressure support. EXAM: ULTRASOUND AND FLUOROSCOPIC-GUIDED CHOLECYSTOSTOMY TUBE PLACEMENT COMPARISON:  CT 04/25/2015 MEDICATIONS: Fentanyl 5.0 mcg IV; Versed 25 mg IV; The patient is currently admitted to the hospital and on intravenous antibiotics. Antibiotics were administered within an appropriate time frame prior to skin puncture. ANESTHESIA/SEDATION: Total Moderate Sedation Time Twenty minutes CONTRAST:  17mL OMNIPAQUE IOHEXOL 300 MG/ML  SOLN FLUOROSCOPY TIME:  2 minutes 6 seconds COMPLICATIONS: None PROCEDURE: Informed written consent was obtained from the patient after a discussion of the risks, benefits and alternatives to treatment. Questions regarding the procedure were encouraged and answered. A timeout was performed prior to the initiation of the procedure. The right upper abdominal quadrant was prepped and draped in the usual sterile fashion, and a sterile drape was applied covering the operative field. Maximum barrier sterile technique with sterile gowns and gloves were used for the procedure. A timeout was performed prior to the initiation of the procedure. Local anesthesia was provided with 1% lidocaine with epinephrine. Ultrasound scanning  of the right upper quadrant demonstrates a markedly dilated gallbladder. Of note, the patient reported pain with  ultrasound imaging over the gallbladder. Utilizing a transhepatic approach, a 22 gauge needle was advanced into the gallbladder under direct ultrasound guidance. An ultrasound image was saved for documentation purposes. Appropriate intraluminal puncture was confirmed with the efflux of bile and advancement of an 0.018 wire into the gallbladder lumen. The needle was exchanged for an [place] set. A small amount of contrast was injected to confirm appropriate intraluminal positioning. Over a Benson wire, a 69.2-French [place] cholecystomy tube was advanced into the gallbladder fossa, coiled and locked. Bile was aspirated and a small amount of contrast was injected as several post procedural spot radiographic images were obtained in various obliquities. The catheter was secured to the skin with suture, connected to a drainage bag and a dressing was placed. The patient tolerated the procedure well without immediate post procedural complication. IMPRESSION: Status post image guided percutaneous cholecystostomy tube. Sample sent for analysis. Signed, Dulcy Fanny. Earleen Newport, DO [facility] Rad[facility] Electronically Signed   By: Corrie Mckusick D.O.   On: 04/25/2015 12:46   Dg Chest Portable 1 View  04/25/2015  CLINICAL DATA:  Central line placement.  Initial encounter. EXAM: PORTABLE CHEST 1 VIEW COMPARISON:  Chest radiograph performed earlier today at 1:09 a.m. FINDINGS: The right IJ line is noted ending about the mid SVC. The lungs are hypoexpanded. Vascular crowding and vascular congestion are noted. Patchy bilateral opacities may reflect atelectasis or possibly mild pneumonia. No definite pleural effusion or pneumothorax is seen. The cardiomediastinal silhouette is normal in size. No acute osseous abnormalities are seen. Spinal stimulation leads are partially imaged. IMPRESSION: 1. Right IJ line noted ending about the mid SVC. 2. Lungs hypoexpanded. Vascular congestion noted.  Patchy bilateral airspace opacities may reflect atelectasis or possibly mild pneumonia, more prominent than on the prior study. Electronically Signed   By: Garald Balding M.D.   On: 04/25/2015 07:11    Labs:  CBC:  Recent Labs  04/25/15 0045 04/25/15 0100 04/25/15 1051 04/26/15 0414  WBC 4.2  --  23.7* 18.9*  HGB 13.0 13.3 11.5* 10.9*  HCT 37.5* 39.0 33.0* 32.0*  PLT 165  --  142* 145*    COAGS:  Recent Labs  04/25/15 1051  INR 1.36  APTT 42*    BMP:  Recent Labs  04/25/15 0045 04/25/15 0100 04/25/15 1051 04/26/15 0414 04/27/15 0450  NA 135 136 136 138 137  K 3.0* 3.0* 2.8* 3.0* 2.8*  CL 103 102 108 111 106  CO2 19*  --  17* 21* 22  GLUCOSE 125* 123* 157* 112* 97  BUN 16 18 19 16 16   CALCIUM 8.5*  --  7.5* 7.9* 8.1*  CREATININE 0.88 0.80 1.07 0.70 0.65  GFRNONAA >60  --  >60 >60 >60  GFRAA >60  --  >60 >60 >60    LIVER FUNCTION TESTS:  Recent Labs  07/05/14 0847 04/25/15 0045 04/25/15 1051  BILITOT 0.4 0.8 1.4*  AST 28 32 34  ALT 40 63 51  ALKPHOS 56 248* 190*  PROT 7.2 6.5 5.5*  ALBUMIN 4.4 2.9* 2.3*    Assessment and Plan:  Percutaneous chole drain placed 11/24 Stays in for 6 weks Follow with [facility] for plan If need drain clinic as OP please contact [phone]    Signed: [place]il[place] Mcphee A 04/27/2015, 9:38 AM   I spent a total of 15 Minutes at the the patient's bedside AND  on the patient's hospital floor or unit, greater than 50% of which was counseling/coordinating care for perc chole drain

## 2015-04-27 NOTE — Progress Notes (Signed)
Subjective: Now in AF with positive troponin.  No chest pain but he has SOB per family with talking.  Abdomen is soft, and not  tender now.    Objective: Vital signs in last 24 hours: Temp:  [97.5 F (36.4 C)-98.2 F (36.8 C)] 97.6 F (36.4 C) (11/26 0524) Pulse Rate:  [81-118] 118 (11/26 0524) Resp:  [19-36] 19 (11/26 0524) BP: (111-142)/(85-93) 111/93 mmHg (11/26 0524) SpO2:  [87 %-96 %] 95 % (11/26 0524) Weight:  [85.1 kg (187 lb 9.8 oz)] 85.1 kg (187 lb 9.8 oz) (11/26 0524) Last BM Date: 04/26/15 240 PO  Drain 275 Stool x 1 Afebrile, VSS K+ 2.8/magnesium 2.5 Troponin up to 5.21 No CBC this AM.   Blood cultures positive Intake/Output from previous day: 11/25 0701 - 11/26 0700 In: 5966.8 [P.O.:240; I.V.:1993.5; IV Piggyback:3733.3] Out: 1325 [Urine:1050; Drains:275] Intake/Output this shift:    General appearance: alert, cooperative and no distress Resp: clear to auscultation bilaterally GI: soft drain with bile, in it.  Looks better today  Lab Results:   Recent Labs  04/25/15 1051 04/26/15 0414  WBC 23.7* 18.9*  HGB 11.5* 10.9*  HCT 33.0* 32.0*  PLT 142* 145*    BMET  Recent Labs  04/26/15 0414 04/27/15 0450  NA 138 137  K 3.0* 2.8*  CL 111 106  CO2 21* 22  GLUCOSE 112* 97  BUN 16 16  CREATININE 0.70 0.65  CALCIUM 7.9* 8.1*   PT/INR  Recent Labs  04/25/15 1051  LABPROT 16.9*  INR 1.36     Recent Labs Lab 04/25/15 0045 04/25/15 1051  AST 32 34  ALT 63 51  ALKPHOS 248* 190*  BILITOT 0.8 1.4*  PROT 6.5 5.5*  ALBUMIN 2.9* 2.3*     Lipase     Component Value Date/Time   LIPASE 24 04/25/2015 0045     Studies/Results: Ir Perc Cholecystostomy  04/25/2015  INDICATION: Acute cholecystitis. Patient is septic, with pharmacologic pressure support. EXAM: ULTRASOUND AND FLUOROSCOPIC-GUIDED CHOLECYSTOSTOMY TUBE PLACEMENT COMPARISON:  CT 04/25/2015 MEDICATIONS: Fentanyl 5.0 mcg IV; Versed 25 mg IV; The patient is currently admitted to  the hospital and on intravenous antibiotics. Antibiotics were administered within an appropriate time frame prior to skin puncture. ANESTHESIA/SEDATION: Total Moderate Sedation Time Twenty minutes CONTRAST:  38mL OMNIPAQUE IOHEXOL 300 MG/ML  SOLN FLUOROSCOPY TIME:  2 minutes 6 seconds COMPLICATIONS: None PROCEDURE: Informed written consent was obtained from the patient after a discussion of the risks, benefits and alternatives to treatment. Questions regarding the procedure were encouraged and answered. A timeout was performed prior to the initiation of the procedure. The right upper abdominal quadrant was prepped and draped in the usual sterile fashion, and a sterile drape was applied covering the operative field. Maximum barrier sterile technique with sterile gowns and gloves were used for the procedure. A timeout was performed prior to the initiation of the procedure. Local anesthesia was provided with 1% lidocaine with epinephrine. Ultrasound scanning of the right upper quadrant demonstrates a markedly dilated gallbladder. Of note, the patient reported pain with ultrasound imaging over the gallbladder. Utilizing a transhepatic approach, a 22 gauge needle was advanced into the gallbladder under direct ultrasound guidance. An ultrasound image was saved for documentation purposes. Appropriate intraluminal puncture was confirmed with the efflux of bile and advancement of an 0.018 wire into the gallbladder lumen. The needle was exchanged for an [place] set. A small amount of contrast was injected to confirm appropriate intraluminal positioning. Over a Benson wire, a 24.2-French [place] Axon cholecystomy  tube was advanced into the gallbladder fossa, coiled and locked. Bile was aspirated and a small amount of contrast was injected as several post procedural spot radiographic images were obtained in various obliquities. The catheter was secured to the skin with suture, connected to a drainage bag and a dressing was placed.  The patient tolerated the procedure well without immediate post procedural complication. IMPRESSION: Status post image guided percutaneous cholecystostomy tube. Sample sent for analysis. Signed, Dulcy Fanny. Earleen Newport, DO [facility]iology Electronically Signed   By: Corrie Mckusick D.O.   On: 04/25/2015 12:46    Medications: . antiseptic oral rinse  7 mL Mouth Rinse q12n4p  . chlorhexidine  15 mL Mouth Rinse BID  . gabapentin  1,200 mg Oral TID  . heparin  5,000 Units Subcutaneous 3 times per day  . piperacillin-tazobactam (ZOSYN)  IV  3.375 g Intravenous 3 times per day  . pneumococcal 13-valent conjugate vaccine  0.5 mL Intramuscular Tomorrow-1000    Assessment/Plan Cholecystitis with sepsis s/p percutaneous cholecystostomy insertion by IR, 04/25/15 Sepsis with shock BActeremia Atrial fibrillation with RVR Elevated Troponin Hx of CAD with stent Hypertension Hx of post polio syndrom OSA Chronic pain with cervical spine stimulator. Hx of neuropathy Migraines Antibiotics:  Day 4 Zosyn DVT:  Heparin/SCD   Plan:  From our standpoint whatever medical treatment he needs can go forward.  He will keep drain for 6 weeks, he can be heparinized and we will plan on drain removal and interval cholecystectomy in 6 weeks.     LOS: 2 days    Sedalia Greeson 04/27/2015

## 2015-04-27 NOTE — Progress Notes (Signed)
Patient's troponin level 5.21 this am.  On call paged.  Awaiting call back.

## 2015-04-28 ENCOUNTER — Inpatient Hospital Stay (HOSPITAL_COMMUNITY): Payer: Medicare Other

## 2015-04-28 DIAGNOSIS — R079 Chest pain, unspecified: Secondary | ICD-10-CM

## 2015-04-28 DIAGNOSIS — I2510 Atherosclerotic heart disease of native coronary artery without angina pectoris: Secondary | ICD-10-CM

## 2015-04-28 DIAGNOSIS — A4151 Sepsis due to Escherichia coli [E. coli]: Secondary | ICD-10-CM | POA: Diagnosis not present

## 2015-04-28 LAB — CULTURE, BLOOD (ROUTINE X 2)

## 2015-04-28 LAB — CBC
HEMATOCRIT: 35.2 % — AB (ref 39.0–52.0)
HEMOGLOBIN: 12.2 g/dL — AB (ref 13.0–17.0)
MCH: 32.1 pg (ref 26.0–34.0)
MCHC: 34.7 g/dL (ref 30.0–36.0)
MCV: 92.6 fL (ref 78.0–100.0)
Platelets: 194 10*3/uL (ref 150–400)
RBC: 3.8 MIL/uL — ABNORMAL LOW (ref 4.22–5.81)
RDW: 13.9 % (ref 11.5–15.5)
WBC: 15.9 10*3/uL — ABNORMAL HIGH (ref 4.0–10.5)

## 2015-04-28 LAB — BASIC METABOLIC PANEL
Anion gap: 7 (ref 5–15)
BUN: 9 mg/dL (ref 6–20)
CHLORIDE: 106 mmol/L (ref 101–111)
CO2: 24 mmol/L (ref 22–32)
CREATININE: 0.52 mg/dL — AB (ref 0.61–1.24)
Calcium: 7.9 mg/dL — ABNORMAL LOW (ref 8.9–10.3)
GFR calc Af Amer: >60 mL/min (ref >60)
GFR calc non Af Amer: >60 mL/min (ref >60)
GLUCOSE: 104 mg/dL — AB (ref 65–99)
POTASSIUM: 3.2 mmol/L — AB (ref 3.5–5.1)
SODIUM: 137 mmol/L (ref 135–145)

## 2015-04-28 LAB — HEPARIN LEVEL (UNFRACTIONATED)
HEPARIN UNFRACTIONATED: 0.16 [IU]/mL — AB (ref 0.30–0.70)
Heparin Unfractionated: <0.1 IU/mL — ABNORMAL LOW (ref 0.30–0.70)
Heparin Unfractionated: <0.1 IU/mL — ABNORMAL LOW (ref 0.30–0.70)

## 2015-04-28 LAB — MAGNESIUM: MAGNESIUM: 1.9 mg/dL (ref 1.7–2.4)

## 2015-04-28 MED ORDER — SODIUM CHLORIDE 0.9 % WEIGHT BASED INFUSION
3.0000 mL/kg/h | INTRAVENOUS | Status: AC
  Administered 2015-04-29: 3 mL/kg/h via INTRAVENOUS

## 2015-04-28 MED ORDER — POTASSIUM CHLORIDE CRYS ER 20 MEQ PO TBCR
40.0000 meq | EXTENDED_RELEASE_TABLET | Freq: Once | ORAL | Status: AC
  Administered 2015-04-28: 40 meq via ORAL
  Filled 2015-04-28: qty 2

## 2015-04-28 MED ORDER — SODIUM CHLORIDE 0.9 % WEIGHT BASED INFUSION
1.0000 mL/kg/h | INTRAVENOUS | Status: DC
  Administered 2015-04-29: 1 mL/kg/h via INTRAVENOUS

## 2015-04-28 MED ORDER — OFF THE BEAT BOOK
Freq: Once | Status: AC
  Administered 2015-04-28: 02:00:00
  Filled 2015-04-28: qty 1

## 2015-04-28 MED ORDER — METOPROLOL TARTRATE 12.5 MG HALF TABLET
12.5000 mg | ORAL_TABLET | Freq: Two times a day (BID) | ORAL | Status: DC
  Administered 2015-04-28 – 2015-05-02 (×9): 12.5 mg via ORAL
  Filled 2015-04-28 (×9): qty 1

## 2015-04-28 MED ORDER — HEPARIN BOLUS VIA INFUSION
2750.0000 [IU] | Freq: Once | INTRAVENOUS | Status: AC
  Administered 2015-04-28: 2750 [IU] via INTRAVENOUS
  Filled 2015-04-28: qty 2750

## 2015-04-28 MED ORDER — MAGNESIUM SULFATE IN D5W 10-5 MG/ML-% IV SOLN
1.0000 g | Freq: Once | INTRAVENOUS | Status: AC
  Administered 2015-04-28: 1 g via INTRAVENOUS
  Filled 2015-04-28: qty 100

## 2015-04-28 MED ORDER — CEFTRIAXONE SODIUM 2 G IJ SOLR
2.0000 g | INTRAMUSCULAR | Status: DC
  Administered 2015-04-28: 2 g via INTRAVENOUS
  Filled 2015-04-28 (×3): qty 2

## 2015-04-28 MED ORDER — ASPIRIN 81 MG PO CHEW
81.0000 mg | CHEWABLE_TABLET | ORAL | Status: AC
  Administered 2015-04-29: 81 mg via ORAL
  Filled 2015-04-28: qty 1

## 2015-04-28 MED ORDER — POTASSIUM CHLORIDE CRYS ER 20 MEQ PO TBCR
40.0000 meq | EXTENDED_RELEASE_TABLET | Freq: Every day | ORAL | Status: DC
  Administered 2015-04-29 – 2015-05-02 (×4): 40 meq via ORAL
  Filled 2015-04-28 (×4): qty 2

## 2015-04-28 MED ORDER — SODIUM CHLORIDE 0.9 % IV SOLN: 250.0000 mL | INTRAVENOUS | Status: DC | PRN

## 2015-04-28 MED ORDER — HEPARIN BOLUS VIA INFUSION
2000.0000 [IU] | Freq: Once | INTRAVENOUS | Status: AC
  Administered 2015-04-28: 2000 [IU] via INTRAVENOUS
  Filled 2015-04-28: qty 2000

## 2015-04-28 MED ORDER — SODIUM CHLORIDE 0.9 % IJ SOLN: 3.0000 mL | INTRAMUSCULAR | Status: DC | PRN

## 2015-04-28 MED ORDER — SODIUM CHLORIDE 0.9 % IJ SOLN: 3.0000 mL | Freq: Two times a day (BID) | INTRAMUSCULAR | Status: DC

## 2015-04-28 NOTE — Progress Notes (Signed)
ANTICOAGULATION CONSULT NOTE - Follow Up Consult  Pharmacy Consult for Heparin  Indication: chest pain/ACS  No Known Allergies  Patient Measurements: Height: 5\' 11"  (180.3 cm) Weight: 197 lb 12.8 oz (89.721 kg) IBW/kg (Calculated) : 75.3   Vital Signs: Temp: 98 F (36.7 C) (11/27 1123) Temp Source: Oral (11/27 1123) BP: 115/81 mmHg (11/27 0424) Pulse Rate: 75 (11/27 0424)  Labs:  Recent Labs  04/26/15 0414 04/27/15 0450 04/27/15 1041 04/27/15 1632 04/28/15 0053 04/28/15 1110  HGB 10.9*  --   --   --  12.2*  --   HCT 32.0*  --   --   --  35.2*  --   PLT 145*  --   --   --  194  --   HEPARINUNFRC  --   --   --  <0.10* <0.10* <0.10*  CREATININE 0.70 0.65  --  0.60* 0.52*  --   TROPONINI  --  5.21* 3.86* 2.98*  --   --     Estimated Creatinine Clearance: 98 mL/min (by C-G formula based on Cr of 0.52).   Assessment: HL undectable after 2000 unit bolus followed by rate increase to 1550 units/hr. Verified with RN heparin still infusing and no loss of access. Hgb 12.2, plt 194. No bleeding reported.  Goal of Therapy:  Heparin level 0.3-0.7 units/ml Monitor platelets by anticoagulation protocol: Yes   Plan:  Heparin 2750 unit bolus Increase heparin gtt to 1900 units/hr 6hr HL Daily CBC/HL Monitor s/sx bleeding  Judieth Keens, PharmD. PGY1 Resident 04/28/2015,1:44 PM

## 2015-04-28 NOTE — Progress Notes (Signed)
Report received via Ray RN in patient's room using SBAR format, reviewed chart, orders, labs, VS, meds and patient's general condition, assumed care of patient.

## 2015-04-28 NOTE — Progress Notes (Signed)
ANTICOAGULATION CONSULT NOTE - Follow Up Consult  Pharmacy Consult for Heparin  Indication: chest pain/ACS  No Known Allergies  Patient Measurements: Height: 5\' 11"  (180.3 cm) Weight: 187 lb 9.8 oz (85.1 kg) IBW/kg (Calculated) : 75.3   Vital Signs: Temp: 97.5 F (36.4 C) (11/26 1604) Temp Source: Oral (11/26 1604) BP: 120/84 mmHg (11/26 2352) Pulse Rate: 81 (11/26 2352)  Labs:  Recent Labs  04/25/15 1051 04/26/15 0414 04/27/15 0450 04/27/15 1041 04/27/15 1632 04/28/15 0053  HGB 11.5* 10.9*  --   --   --  12.2*  HCT 33.0* 32.0*  --   --   --  35.2*  PLT 142* 145*  --   --   --  194  APTT 42*  --   --   --   --   --   LABPROT 16.9*  --   --   --   --   --   INR 1.36  --   --   --   --   --   HEPARINUNFRC  --   --   --   --  <0.10* <0.10*  CREATININE 1.07 0.70 0.65  --  0.60* 0.52*  TROPONINI 0.20*  --  5.21* 3.86* 2.98*  --     Estimated Creatinine Clearance: 98 mL/min (by C-G formula based on Cr of 0.52).   Assessment: Undetectable heparin level despite rate increase, no issues per RN.   Goal of Therapy:  Heparin level 0.3-0.7 units/ml Monitor platelets by anticoagulation protocol: Yes   Plan:  -Heparin 2000 units BOLUS -Increase heparin drip to 1550 units/hr -1000 HL -Daily CBC/HL -Monitor for bleeding  Narda Bonds 04/28/2015,2:25 AM

## 2015-04-28 NOTE — Progress Notes (Signed)
  Subjective: Minimal to no abdominal discomfort  Objective: Vital signs in last 24 hours: Temp:  [97.5 F (36.4 C)-98.7 F (37.1 C)] 98 F (36.7 C) (11/27 0724) Pulse Rate:  [75-81] 75 (11/27 0424) Resp:  [20-24] 24 (11/27 0424) BP: (115-129)/(78-87) 115/81 mmHg (11/27 0424) SpO2:  [92 %-96 %] 96 % (11/27 0424) Weight:  [89.721 kg (197 lb 12.8 oz)] 89.721 kg (197 lb 12.8 oz) (11/27 0424) Last BM Date: 04/27/15  Intake/Output from previous day: 11/26 0701 - 11/27 0700 In: 2202.1 [P.O.:1440; I.V.:662.1; IV Piggyback:100] Out: 2650 [Urine:2400; Drains:250] Intake/Output this shift: Total I/O In: 120 [P.O.:120] Out: -   Abdomen soft, drain with bile  Lab Results:   Recent Labs  04/26/15 0414 04/28/15 0053  WBC 18.9* 15.9*  HGB 10.9* 12.2*  HCT 32.0* 35.2*  PLT 145* 194   BMET  Recent Labs  04/27/15 1632 04/28/15 0053  NA 136 137  K 2.9* 3.2*  CL 106 106  CO2 22 24  GLUCOSE 150* 104*  BUN 14 9  CREATININE 0.60* 0.52*  CALCIUM 8.0* 7.9*   PT/INR  Recent Labs  04/25/15 1051  LABPROT 16.9*  INR 1.36   ABG No results for input(s): PHART, HCO3 in the last 72 hours.  Invalid input(s): PCO2, PO2  Studies/Results: No results found.  Anti-infectives: Anti-infectives    Start     Dose/Rate Route Frequency Ordered Stop   04/25/15 1000  piperacillin-tazobactam (ZOSYN) IVPB 3.375 g     3.375 g 12.5 mL/hr over 240 Minutes Intravenous 3 times per day 04/25/15 0121     04/25/15 0900  fluconazole (DIFLUCAN) IVPB 400 mg  Status:  Discontinued     400 mg 100 mL/hr over 120 Minutes Intravenous Every 24 hours 04/25/15 0821 04/25/15 1333   04/25/15 0800  vancomycin (VANCOCIN) IVPB 1000 mg/200 mL premix  Status:  Discontinued     1,000 mg 200 mL/hr over 60 Minutes Intravenous Every 8 hours 04/25/15 0720 04/25/15 1333   04/25/15 0100  piperacillin-tazobactam (ZOSYN) IVPB 3.375 g     3.375 g 100 mL/hr over 30 Minutes Intravenous  Once 04/25/15 0056 04/25/15  0308      Assessment/Plan: s/p * No surgery found *  Cholecystitis s/p perc drain by IR  Continuing antibiotics Advance diet Possible heart cath tomorrow per Cards  LOS: 3 days    Alexander [name] A 04/28/2015

## 2015-04-28 NOTE — Progress Notes (Signed)
PATIENT DETAILS Name: Alexander [name] Age: 64 y.o. Sex: male Date of Birth: 07/07/1950 Admit Date: 04/25/2015 Admitting Physician Raylene Miyamoto, MD [id number] [name], MD  Brief narrative:  64 year old male with history of poliomyelitis-CAD status post remote PCI-admitted with acute cholecystitis and septic shock. Admitted to the intensive care unit, started on pressors and broad-spectrum antibiotics. Seen by general surgery, subsequently had percutaneous cholecystostomy drain placed. Once improved, transferred to the hospitalist service on 11/27. Hospital course has been complicated by development of atrial fibrillation with RVR and non-STEMI.  Subjective:  Patient in bed, denies any headache, no chest pain. Currently no shortness of breath. Does have some right upper quadrant discomfort which is improving as well.  Assessment/Plan:  Septic shock due to acute acalculous cholecystitis with Escherichia coli and Klebsiella bacteremia.:  Required pressors on admission. With antibiotics and supportive care, sepsis pathophysiology has now resolved. We'll transition down to Rocephin IV on 04/28/2015 based on his culture and sensitivity data, we'll likely give for a total of 14 days. General surgery on board, he has cholecystectomy drain placed by IR on 04/25/2015 which will be continued. Further management per general surgery likely will have drain for 4-6 weeks. Follow repeat blood cultures.   A. fib RVR: Cardiology consulted,started on amiodarone infusion for rate control, and on heparin infusion CHADS2Vasc score of 3. Echocardiogram pending. Cardiology following.  Non-STEMI: Has underlying CAD-suspect that A. fib RVR/hypotension probably precipitated non-STEMI. Continue aspirin, statin, IV heparin. Cardiology contemplating cardiac catheterization on 04/29/2015.  Essential hypertension: Blood pressure controlled without antihypertensives-resume lisinopril when  able  GERD: Continue PPI  History of poliomyelitis with postpolio syndrome: Significant muscle atrophy in bilateral lower extremities. Continue Neurontin. PT eval when more stable. Normally ambulates with braces and walker as needed.  Hypokalemia: Replace Will monitor.  Chronic pain syndrome/chronic headaches: Currently stable-takes Excedrin Migraine (up to 12 times daily!!).     Disposition: Remain inpatient  Antimicrobial agents  See below  Anti-infectives    Start     Dose/Rate Route Frequency Ordered Stop   04/28/15 1300  cefTRIAXone (ROCEPHIN) 1 g in dextrose 5 % 50 mL IVPB     1 g 100 mL/hr over 30 Minutes Intravenous Every 24 hours 04/28/15 1252     04/25/15 1000  piperacillin-tazobactam (ZOSYN) IVPB 3.375 g  Status:  Discontinued     3.375 g 12.5 mL/hr over 240 Minutes Intravenous 3 times per day 04/25/15 0121 04/28/15 1252   04/25/15 0900  fluconazole (DIFLUCAN) IVPB 400 mg  Status:  Discontinued     400 mg 100 mL/hr over 120 Minutes Intravenous Every 24 hours 04/25/15 0821 04/25/15 1333   04/25/15 0800  vancomycin (VANCOCIN) IVPB 1000 mg/200 mL premix  Status:  Discontinued     1,000 mg 200 mL/hr over 60 Minutes Intravenous Every 8 hours 04/25/15 0720 04/25/15 1333   04/25/15 0100  piperacillin-tazobactam (ZOSYN) IVPB 3.375 g     3.375 g 100 mL/hr over 30 Minutes Intravenous  Once 04/25/15 0056 04/25/15 0308      DVT Prophylaxis: IV Heparin gtt  Code Status: Full code   Family Communication Family member at bedside  Procedures: 11/24>>biliary perc drain  CONSULTS:  cardiology, pulmonary/intensive care and general surgery  Time spent 30 minutes-Greater than 50% of this time was spent in counseling, explanation of diagnosis, planning of further management, and coordination of care.  MEDICATIONS: Scheduled Meds: . antiseptic oral rinse  7 mL  Mouth Rinse q12n4p  . aspirin  81 mg Oral Daily  . [START ON 04/29/2015] aspirin  81 mg Oral Pre-Cath  .  atorvastatin  80 mg Oral q1800  . cefTRIAXone (ROCEPHIN)  IV  1 g Intravenous Q24H  . chlorhexidine  15 mL Mouth Rinse BID  . gabapentin  1,200 mg Oral TID  . metoprolol tartrate  12.5 mg Oral BID  . pneumococcal 13-valent conjugate vaccine  0.5 mL Intramuscular Tomorrow-1000  . [START ON 04/29/2015] potassium chloride  40 mEq Oral Daily   Continuous Infusions: . [START ON 04/29/2015] sodium chloride     Followed by  . [START ON 04/29/2015] sodium chloride    . amiodarone 30 mg/hr (04/27/15 2316)  . heparin 1,550 Units/hr (04/28/15 0230)   PRN Meds:.sodium chloride, sodium chloride, acetaminophen, fentaNYL (SUBLIMAZE) injection, methocarbamol, ondansetron (ZOFRAN) IV    PHYSICAL EXAM: Vital signs in last 24 hours: Filed Vitals:   04/28/15 0052 04/28/15 0424 04/28/15 0724 04/28/15 1123  BP: 129/78 115/81    Pulse: 79 75    Temp: 98.3 F (36.8 C) 98.7 F (37.1 C) 98 F (36.7 C) 98 F (36.7 C)  TempSrc: Oral Oral Oral Oral  Resp: 20 24    Height:      Weight:  89.721 kg (197 lb 12.8 oz)    SpO2: 92% 96%      Weight change: 4.622 kg (10 lb 3 oz) Filed Weights   04/26/15 0138 04/27/15 0524 04/28/15 0424  Weight: 83.5 kg (184 lb 1.4 oz) 85.1 kg (187 lb 9.8 oz) 89.721 kg (197 lb 12.8 oz)   Body mass index is 27.6 kg/(m^2).   Gen Exam: Awake and alert with clear speech.  Neck: Supple, No JVD.   Chest: B/L Clear.   CVS: S1 S2 Regular, no murmurs.  Abdomen: soft, BS +, non tender, non distended. Gallbladder drain in place Extremities: no edema, lower extremities warm to touch. Neurologic: Non Focal.  Chronic lower extremity weakness due to polio Skin: No Rash.   Wounds: N/A.    Intake/Output from previous day:  Intake/Output Summary (Last 24 hours) at 04/28/15 1253 Last data filed at 04/28/15 0758  Gross per 24 hour  Intake 1842.06 ml  Output   2250 ml  Net -407.94 ml     LAB RESULTS: CBC  Recent Labs Lab 04/25/15 0045 04/25/15 0100 04/25/15 1051  04/26/15 0414 04/28/15 0053  WBC 4.2  --  23.7* 18.9* 15.9*  HGB 13.0 13.3 11.5* 10.9* 12.2*  HCT 37.5* 39.0 33.0* 32.0* 35.2*  PLT 165  --  142* 145* 194  MCV 93.8  --  94.0 93.8 92.6  MCH 32.5  --  32.8 32.0 32.1  MCHC 34.7  --  34.8 34.1 34.7  RDW 13.2  --  13.7 14.0 13.9  LYMPHSABS 0.3*  --   --   --   --   MONOABS 0.1  --   --   --   --   EOSABS 0.0  --   --   --   --   BASOSABS 0.0  --   --   --   --     Chemistries   Recent Labs Lab 04/25/15 1051 04/26/15 0414 04/27/15 0450 04/27/15 1632 04/28/15 0053  NA 136 138 137 136 137  K 2.8* 3.0* 2.8* 2.9* 3.2*  CL 108 111 106 106 106  CO2 17* 21* 22 22 24   GLUCOSE 157* 112* 97 150* 104*  BUN 19 16 16  14  9  CREATININE 1.07 0.70 0.65 0.60* 0.52*  CALCIUM 7.5* 7.9* 8.1* 8.0* 7.9*  MG  --  2.5*  --   --  1.9    CBG:  Recent Labs Lab 04/25/15 1019  GLUCAP 134*    GFR Estimated Creatinine Clearance: 98 mL/min (by C-G formula based on Cr of 0.52).  Coagulation profile  Recent Labs Lab 04/25/15 1051  INR 1.36    Cardiac Enzymes  Recent Labs Lab 04/27/15 0450 04/27/15 1041 04/27/15 1632  TROPONINI 5.21* 3.86* 2.98*    Invalid input(s): POCBNP No results for input(s): DDIMER in the last 72 hours. No results for input(s): HGBA1C in the last 72 hours. No results for input(s): CHOL, HDL, LDLCALC, TRIG, CHOLHDL, LDLDIRECT in the last 72 hours. No results for input(s): TSH, T4TOTAL, T3FREE, THYROIDAB in the last 72 hours.  Invalid input(s): FREET3 No results for input(s): VITAMINB12, FOLATE, FERRITIN, TIBC, IRON, RETICCTPCT in the last 72 hours. No results for input(s): LIPASE, AMYLASE in the last 72 hours.  Urine Studies No results for input(s): UHGB, CRYS in the last 72 hours.  Invalid input(s): UACOL, UAPR, USPG, UPH, UTP, UGL, UKET, UBIL, UNIT, UROB, ULEU, UEPI, UWBC, URBC, UBAC, CAST, UCOM, BILUA  MICROBIOLOGY: Recent Results (from the past 240 hour(s))  Culture, blood (routine x 2)     Status:  None   Collection Time: 04/25/15 12:45 AM  Result Value Ref Range Status   Specimen Description BLOOD LEFT ARM  Final   Special Requests BOTTLES DRAWN AEROBIC AND ANAEROBIC 5ML  Final   Culture  Setup Time   Final    GRAM NEGATIVE RODS IN BOTH AEROBIC AND ANAEROBIC BOTTLES CRITICAL RESULT CALLED TO, READ BACK BY AND VERIFIED WITH: L FLOOD 04/25/15 @ May OXYTOCA   Final   Report Status 04/28/2015 FINAL  Final   Organism ID, Bacteria ESCHERICHIA COLI  Final   Organism ID, Bacteria KLEBSIELLA OXYTOCA  Final      Susceptibility   Escherichia coli - MIC*    AMPICILLIN >=32 RESISTANT Resistant     CEFAZOLIN <=4 SENSITIVE Sensitive     CEFEPIME <=1 SENSITIVE Sensitive     CEFTAZIDIME <=1 SENSITIVE Sensitive     CEFTRIAXONE <=1 SENSITIVE Sensitive     CIPROFLOXACIN <=0.25 SENSITIVE Sensitive     GENTAMICIN <=1 SENSITIVE Sensitive     IMIPENEM <=0.25 SENSITIVE Sensitive     TRIMETH/SULFA <=20 SENSITIVE Sensitive     AMPICILLIN/SULBACTAM 16 INTERMEDIATE Intermediate     PIP/TAZO <=4 SENSITIVE Sensitive     * ESCHERICHIA COLI   Klebsiella oxytoca - MIC*    AMPICILLIN >=32 RESISTANT Resistant     CEFAZOLIN 8 SENSITIVE Sensitive     CEFEPIME <=1 SENSITIVE Sensitive     CEFTAZIDIME <=1 SENSITIVE Sensitive     CEFTRIAXONE <=1 SENSITIVE Sensitive     CIPROFLOXACIN <=0.25 SENSITIVE Sensitive     GENTAMICIN <=1 SENSITIVE Sensitive     IMIPENEM 0.5 SENSITIVE Sensitive     TRIMETH/SULFA <=20 SENSITIVE Sensitive     AMPICILLIN/SULBACTAM 8 SENSITIVE Sensitive     PIP/TAZO <=4 SENSITIVE Sensitive     * KLEBSIELLA OXYTOCA  Culture, blood (routine x 2)     Status: None (Preliminary result)   Collection Time: 04/25/15 12:53 AM  Result Value Ref Range Status   Specimen Description BLOOD RIGHT ARM  Final   Special Requests BOTTLES DRAWN AEROBIC AND ANAEROBIC 5CC  Final   Culture  Setup Time  Final    GRAM NEGATIVE RODS IN BOTH AEROBIC AND  ANAEROBIC BOTTLES CRITICAL RESULT CALLED TO, READ BACK BY AND VERIFIED WITH: L FLOOD 04/25/15 @ 65 M VESTAL    Culture   Final    ESCHERICHIA COLI CULTURE REINCUBATED FOR BETTER GROWTH    Report Status PENDING  Incomplete  Urine culture     Status: None   Collection Time: 04/25/15  1:03 AM  Result Value Ref Range Status   Specimen Description URINE, CLEAN CATCH  Final   Special Requests NONE  Final   Culture 4,000 COLONIES/mL INSIGNIFICANT GROWTH  Final   Report Status 04/26/2015 FINAL  Final  MRSA PCR Screening     Status: None   Collection Time: 04/25/15 10:36 AM  Result Value Ref Range Status   MRSA by PCR NEGATIVE NEGATIVE Final    Comment:        The GeneXpert MRSA Assay (FDA approved for NASAL specimens only), is one component of a comprehensive MRSA colonization surveillance program. It is not intended to diagnose MRSA infection nor to guide or monitor treatment for MRSA infections.   Culture, body fluid-bottle     Status: None   Collection Time: 04/25/15 12:21 PM  Result Value Ref Range Status   Specimen Description BILE  Final   Special Requests NONE  Final   Gram Stain MULTIPLE SPECIES PRESENT  Final   Culture MULTIPLE ORGANISMS PRESENT, NONE PREDOMINANT  Final   Report Status 04/27/2015 FINAL  Final  Gram stain     Status: None   Collection Time: 04/25/15 12:21 PM  Result Value Ref Range Status   Specimen Description BILE  Final   Special Requests NONE  Final   Gram Stain   Final    DEGENERATED CELLULAR MATERIAL PRESENT ABUNDANT GRAM POSITIVE AND GRAM NEGATIVE BACTERIA PRESENT CONSISTENT WITH LOWER GI FLORA RARE WBC PRESENT,BOTH PMN AND MONONUCLEAR NO YEAST OR FUNGAL ELEMENTS SEEN    Report Status 04/25/2015 FINAL  Final    RADIOLOGY STUDIES/RESULTS: Dg Chest 2 View  04/25/2015  CLINICAL DATA:  Initial evaluation for possible sepsis. EXAM: CHEST  2 VIEW COMPARISON:  None. FINDINGS: Examination is somewhat limited by technique and lordotic  angulation. Cardiac and mediastinal silhouettes grossly within normal limits. Spinal stimulator electrodes noted. Lungs are hypoinflated. Mild right basilar atelectasis. There is diffuse peribronchial thickening with mild prominence of the interstitial markings, greatest within the left mid and lower lung. Finding is nonspecific, but may reflect sequela of bronchiolitis or possible atypical/interstitial pneumonia. No consolidative airspace opacity. No pulmonary edema or pleural effusion. No pneumothorax. No acute osseus abnormality. IMPRESSION: Diffuse peribronchial thickening with prominence of the interstitial markings, greatest within the mid and lower left lung. Finding is nonspecific but may reflect sequela of bronchiolitis or possible atypical/ interstitial pneumonia. No consolidative airspace disease. Electronically Signed   By: Jeannine Boga M.D.   On: 04/25/2015 01:40   Ct Abdomen Pelvis W Contrast  04/25/2015  CLINICAL DATA:  Low abdominal pain and low chest pain since yesterday. Constipation on Tuesday and Wednesday. Fever. EXAM: CT ABDOMEN AND PELVIS WITH CONTRAST TECHNIQUE: Multidetector CT imaging of the abdomen and pelvis was performed using the standard protocol following bolus administration of intravenous contrast. CONTRAST:  147mL OMNIPAQUE IOHEXOL 300 MG/ML  SOLN COMPARISON:  None. FINDINGS: Interstitial fibrosis in the lung bases with ground-glass alveolitis particularly on the right. Calcified granulomas in the lung bases. Coronary artery calcifications. The gallbladder is distended. No stones are identified but there is evidence  of wall thickening and edema with mild inflammatory reaction around the gallbladder. Mild intra and extrahepatic bile duct dilatation. No discrete stone or mass identified. There is infiltration around the distal aspect of the stomach and duodenal bulb region and around the head of the pancreas. This is nonspecific and could represent inflammatory change due  to peptic ulcer disease, cholecystitis, or focal pancreatitis. The spleen, inferior vena cava, and retroperitoneal lymph nodes are unremarkable. Calcification of the aorta without aneurysm. Subcentimeter cysts in the right kidney. No hydronephrosis in the kidneys. Nodule in the left adrenal gland measures 18 mm diameter. Hounsfield unit measurements are indeterminate but statistically this most likely represents benign adenoma. Consider followup in 1 year with noncontrast CT. Stomach, small bowel, and colon are mostly decompressed. No free air or free fluid in the abdomen. Pelvis: Prostate gland is mildly enlarged. Bladder wall is not thickened. No free or loculated pelvic fluid collections. No pelvic mass or lymphadenopathy. Rectosigmoid colon is unremarkable. Appendix is not identified. Degenerative changes in the spine. Spondylolysis with mild spondylolisthesis at L5-S1. No destructive bone lesions. Spinal stimulator device. IMPRESSION: Inflammatory process in the right upper quadrant with distended thick-walled gallbladder, bile duct dilatation, and infiltration in the adjacent fat. Etiology is nonspecific in could indicate cholecystitis, focal pancreatitis, or peptic ulcer disease. No evidence of bowel perforation or obstruction. Interstitial fibrosis and inflammatory changes in the lung bases. Electronically Signed   By: Lucienne Capers M.D.   On: 04/25/2015 02:44   Ir Perc Cholecystostomy  04/25/2015  INDICATION: Acute cholecystitis. Patient is septic, with pharmacologic pressure support. EXAM: ULTRASOUND AND FLUOROSCOPIC-GUIDED CHOLECYSTOSTOMY TUBE PLACEMENT COMPARISON:  CT 04/25/2015 MEDICATIONS: Fentanyl 5.0 mcg IV; Versed 25 mg IV; The patient is currently admitted to the hospital and on intravenous antibiotics. Antibiotics were administered within an appropriate time frame prior to skin puncture. ANESTHESIA/SEDATION: Total Moderate Sedation Time Twenty minutes CONTRAST:  64mL OMNIPAQUE IOHEXOL 300  MG/ML  SOLN FLUOROSCOPY TIME:  2 minutes 6 seconds COMPLICATIONS: None PROCEDURE: Informed written consent was obtained from the patient after a discussion of the risks, benefits and alternatives to treatment. Questions regarding the procedure were encouraged and answered. A timeout was performed prior to the initiation of the procedure. The right upper abdominal quadrant was prepped and draped in the usual sterile fashion, and a sterile drape was applied covering the operative field. Maximum barrier sterile technique with sterile gowns and gloves were used for the procedure. A timeout was performed prior to the initiation of the procedure. Local anesthesia was provided with 1% lidocaine with epinephrine. Ultrasound scanning of the right upper quadrant demonstrates a markedly dilated gallbladder. Of note, the patient reported pain with ultrasound imaging over the gallbladder. Utilizing a transhepatic approach, a 22 gauge needle was advanced into the gallbladder under direct ultrasound guidance. An ultrasound image was saved for documentation purposes. Appropriate intraluminal puncture was confirmed with the efflux of bile and advancement of an 0.018 wire into the gallbladder lumen. The needle was exchanged for an Keenes set. A small amount of contrast was injected to confirm appropriate intraluminal positioning. Over a Benson wire, a 39.2-French Cook cholecystomy tube was advanced into the gallbladder fossa, coiled and locked. Bile was aspirated and a small amount of contrast was injected as several post procedural spot radiographic images were obtained in various obliquities. The catheter was secured to the skin with suture, connected to a drainage bag and a dressing was placed. The patient tolerated the procedure well without immediate post procedural complication. IMPRESSION: Status  post image guided percutaneous cholecystostomy tube. Sample sent for analysis. Signed, Dulcy Fanny. Earleen Newport, DO [facility] Electronically Signed   By: Corrie Mckusick D.O.   On: 04/25/2015 12:46   Dg Chest Portable 1 View  04/25/2015  CLINICAL DATA:  Central line placement.  Initial encounter. EXAM: PORTABLE CHEST 1 VIEW COMPARISON:  Chest radiograph performed earlier today at 1:09 a.m. FINDINGS: The right IJ line is noted ending about the mid SVC. The lungs are hypoexpanded. Vascular crowding and vascular congestion are noted. Patchy bilateral opacities may reflect atelectasis or possibly mild pneumonia. No definite pleural effusion or pneumothorax is seen. The cardiomediastinal silhouette is normal in size. No acute osseous abnormalities are seen. Spinal stimulation leads are partially imaged. IMPRESSION: 1. Right IJ line noted ending about the mid SVC. 2. Lungs hypoexpanded. Vascular congestion noted. Patchy bilateral airspace opacities may reflect atelectasis or possibly mild pneumonia, more prominent than on the prior study. Electronically Signed   By: Garald Balding M.D.   On: 04/25/2015 07:11    Thurnell Lose, MD  [facility] Pager:[phone]  If 7PM-7AM, please contact night-coverage [email or web address] Password [email or web address] 04/28/2015, 12:53 PM   LOS: 3 days

## 2015-04-28 NOTE — Progress Notes (Signed)
  Echocardiogram 2D Echocardiogram has been performed.  Alexander [name] 04/28/2015, 1:19 PM

## 2015-04-28 NOTE — Progress Notes (Signed)
Patient Name: Alexander [name] Date of Encounter: 04/28/2015     Principal Problem:   Cholecystitis Active Problems:   Septic shock (HCC)   NSTEMI (non-ST elevated myocardial infarction) ([place])   Bacteremia   HLD (hyperlipidemia)   CAD (coronary artery disease)   Hypokalemia   Atrial fibrillation with RVR (HCC)   Post-polio syndrome   Chronic pain syndrome   OSA (obstructive sleep apnea)   Essential hypertension    SUBJECTIVE  No chest pain or sob.  Mild right sided abd pain though overall improved.  Converted to sinus yesterday at 11am.  Troponin trending down.  CURRENT MEDS . antiseptic oral rinse  7 mL Mouth Rinse q12n4p  . aspirin  81 mg Oral Daily  . atorvastatin  80 mg Oral q1800  . chlorhexidine  15 mL Mouth Rinse BID  . gabapentin  1,200 mg Oral TID  . magnesium sulfate 1 - 4 g bolus IVPB  1 g Intravenous Once  . piperacillin-tazobactam (ZOSYN)  IV  3.375 g Intravenous 3 times per day  . pneumococcal 13-valent conjugate vaccine  0.5 mL Intramuscular Tomorrow-1000    OBJECTIVE  Filed Vitals:   04/27/15 2352 04/28/15 0052 04/28/15 0424 04/28/15 0724  BP: 120/84 129/78 115/81   Pulse: 81 79 75   Temp: 98.6 F (37 C) 98.3 F (36.8 C) 98.7 F (37.1 C) 98 F (36.7 C)  TempSrc: Oral Oral Oral Oral  Resp: 21 20 24    Height:      Weight:   197 lb 12.8 oz (89.721 kg)   SpO2: 93% 92% 96%     Intake/Output Summary (Last 24 hours) at 04/28/15 0854 Last data filed at 04/28/15 0758  Gross per 24 hour  Intake 2322.06 ml  Output   2650 ml  Net -327.94 ml   Filed Weights   04/26/15 0138 04/27/15 0524 04/28/15 0424  Weight: 184 lb 1.4 oz (83.5 kg) 187 lb 9.8 oz (85.1 kg) 197 lb 12.8 oz (89.721 kg)    PHYSICAL EXAM  General: Pleasant, NAD. Neuro: Alert and oriented X 3. Moves all extremities spontaneously. Psych: Normal affect. HEENT:  Normal  Neck: Supple without bruits or JVD. Lungs:  Resp regular and unlabored, diminished breath sounds in bilat bases  with assoc crackles - right worse than left.  OTW CTA. Heart: RRR no s3, s4, or murmurs. Abdomen: Soft, mild ruq tenderness, non-distended, BS + x 4.  Extremities: No clubbing, cyanosis or edema. DP/PT/Radials 2+ and equal bilaterally.  Accessory Clinical Findings  CBC  Recent Labs  04/26/15 0414 04/28/15 0053  WBC 18.9* 15.9*  HGB 10.9* 12.2*  HCT 32.0* 35.2*  MCV 93.8 92.6  PLT 145* [id number]   Basic Metabolic Panel  Recent Labs  04/26/15 0414  04/27/15 1632 04/28/15 0053  NA 138  < > 136 137  K 3.0*  < > 2.9* 3.2*  CL 111  < > 106 106  CO2 21*  < > 22 24  GLUCOSE 112*  < > 150* 104*  BUN 16  < > 14 9  CREATININE 0.70  < > 0.60* 0.52*  CALCIUM 7.9*  < > 8.0* 7.9*  MG 2.5*  --   --  1.9  PHOS 1.8*  --   --   --   < > = values in this interval not displayed. Liver Function Tests  Recent Labs  04/25/15 1051  AST 34  ALT 51  ALKPHOS 190*  BILITOT 1.4*  PROT 5.5*  ALBUMIN 2.3*  Cardiac Enzymes  Recent Labs  04/27/15 0450 04/27/15 1041 04/27/15 1632  TROPONINI 5.21* 3.86* 2.98*   Thyroid Function Tests  Recent Labs  04/25/15 1050  TSH 0.529    TELE  Converted to sinus rhythm @ 1500 on 11/26.  ECG  Rsr, 87, lae, inc rbbb, no acute st/t changes.  Radiology/Studies  Dg Chest 2 View  04/25/2015  CLINICAL DATA:  Initial evaluation for possible sepsis. EXAM: CHEST  2 VIEW COMPARISON:  None. FINDINGS: Examination is somewhat limited by technique and lordotic angulation. Cardiac and mediastinal silhouettes grossly within normal limits. Spinal stimulator electrodes noted. Lungs are hypoinflated. Mild right basilar atelectasis. There is diffuse peribronchial thickening with mild prominence of the interstitial markings, greatest within the left mid and lower lung. Finding is nonspecific, but may reflect sequela of bronchiolitis or possible atypical/interstitial pneumonia. No consolidative airspace opacity. No pulmonary edema or pleural effusion. No  pneumothorax. No acute osseus abnormality. IMPRESSION: Diffuse peribronchial thickening with prominence of the interstitial markings, greatest within the mid and lower left lung. Finding is nonspecific but may reflect sequela of bronchiolitis or possible atypical/ interstitial pneumonia. No consolidative airspace disease. Electronically Signed   By: Jeannine Boga M.D.   On: 04/25/2015 01:40   Ct Abdomen Pelvis W Contrast  04/25/2015  CLINICAL DATA:  Low abdominal pain and low chest pain since yesterday. Constipation on Tuesday and Wednesday. Fever. EXAM: CT ABDOMEN AND PELVIS WITH CONTRAST TECHNIQUE: Multidetector CT imaging of the abdomen and pelvis was performed using the standard protocol following bolus administration of intravenous contrast. CONTRAST:  131mL OMNIPAQUE IOHEXOL 300 MG/ML  SOLN COMPARISON:  None. FINDINGS: Interstitial fibrosis in the lung bases with ground-glass alveolitis particularly on the right. Calcified granulomas in the lung bases. Coronary artery calcifications. The gallbladder is distended. No stones are identified but there is evidence of wall thickening and edema with mild inflammatory reaction around the gallbladder. Mild intra and extrahepatic bile duct dilatation. No discrete stone or mass identified. There is infiltration around the distal aspect of the stomach and duodenal bulb region and around the head of the pancreas. This is nonspecific and could represent inflammatory change due to peptic ulcer disease, cholecystitis, or focal pancreatitis. The spleen, inferior vena cava, and retroperitoneal lymph nodes are unremarkable. Calcification of the aorta without aneurysm. Subcentimeter cysts in the right kidney. No hydronephrosis in the kidneys. Nodule in the left adrenal gland measures 18 mm diameter. Hounsfield unit measurements are indeterminate but statistically this most likely represents benign adenoma. Consider followup in 1 year with noncontrast CT. Stomach, small  bowel, and colon are mostly decompressed. No free air or free fluid in the abdomen. Pelvis: Prostate gland is mildly enlarged. Bladder wall is not thickened. No free or loculated pelvic fluid collections. No pelvic mass or lymphadenopathy. Rectosigmoid colon is unremarkable. Appendix is not identified. Degenerative changes in the spine. Spondylolysis with mild spondylolisthesis at L5-S1. No destructive bone lesions. Spinal stimulator device. IMPRESSION: Inflammatory process in the right upper quadrant with distended thick-walled gallbladder, bile duct dilatation, and infiltration in the adjacent fat. Etiology is nonspecific in could indicate cholecystitis, focal pancreatitis, or peptic ulcer disease. No evidence of bowel perforation or obstruction. Interstitial fibrosis and inflammatory changes in the lung bases. Electronically Signed   By: Lucienne Capers M.D.   On: 04/25/2015 02:44   Dg Chest Portable 1 View  04/25/2015  CLINICAL DATA:  Central line placement.  Initial encounter. EXAM: PORTABLE CHEST 1 VIEW COMPARISON:  Chest radiograph performed earlier today at 1:09  a.m. FINDINGS: The right IJ line is noted ending about the mid SVC. The lungs are hypoexpanded. Vascular crowding and vascular congestion are noted. Patchy bilateral opacities may reflect atelectasis or possibly mild pneumonia. No definite pleural effusion or pneumothorax is seen. The cardiomediastinal silhouette is normal in size. No acute osseous abnormalities are seen. Spinal stimulation leads are partially imaged. IMPRESSION: 1. Right IJ line noted ending about the mid SVC. 2. Lungs hypoexpanded. Vascular congestion noted. Patchy bilateral airspace opacities may reflect atelectasis or possibly mild pneumonia, more prominent than on the prior study. Electronically Signed   By: Garald Balding M.D.   On: 04/25/2015 07:11    ASSESSMENT AND PLAN  1.  Acute cholecystitis: s/p percutaneous drain.  No plan for surgery currently - to be  considered in 6 wks.  Abx per IM.  Preop cardiac eval/cath tomorrow.  Will need to keep pending surgery in mind if he were to require stenting.  2.  NSTEMI/CAD:  Prior h/o stenting in 1995, 2000, and 2005.  Trop mildly elevated in setting of #1 along with sepsis/hypotn on admission and then peaked @ 5.21 yesterday after going into rapid AF on the morning of 11/26.  He did have chest tightness on admission but none since.  Echo ordered yesterday - not yet done.  Cont asa, statin.  Add bb as bp stable.  Plan cath tomorrow as troponin higher than expected with demand ischemia.  WBC coming down, remains afebrile and on abx for bacteremia.   3.  PAF:  Went into AF on 11/26 @ approx 4:30 AM.  Amio added yesterday and now in sinus.  Echo pending.  Cont IV amio for now given ongoing recovery and risk of going back into AF.  Add bb today.  Hopefully can d/c amio w/in next 24-48 hrs.  CHA2DS2VASc = 3.  On heparin currently in setting of ACS.  Not yet clear if he will require long term [place].  4.  Sepsis/Bacteremia:  Hemodynamically stable.  Abx per IM.  5.  Hypokalemia:  Persistent.  Supp.  6.  Essential HTN:  Stable.  Was on ACEI @ home.  7.  HL:  Cont statin.  8.  Diminished breath sounds:  R worse than L.  F/u CXR.    Signed, Murray Hodgkins NP

## 2015-04-28 NOTE — Progress Notes (Signed)
ANTICOAGULATION CONSULT NOTE - Follow Up Consult  Pharmacy Consult for Heparin  Indication: chest pain/ACS  No Known Allergies  Patient Measurements: Height: 5\' 11"  (180.3 cm) Weight: 197 lb 12.8 oz (89.721 kg) IBW/kg (Calculated) : 75.3   Vital Signs: Temp: 98 F (36.7 C) (11/27 1123) Temp Source: Oral (11/27 1123) BP: 122/84 mmHg (11/27 1355) Pulse Rate: 84 (11/27 1355)  Labs:  Recent Labs  04/26/15 0414 04/27/15 0450 04/27/15 1041  04/27/15 1632 04/28/15 0053 04/28/15 1110 04/28/15 2037  HGB 10.9*  --   --   --   --  12.2*  --   --   HCT 32.0*  --   --   --   --  35.2*  --   --   PLT 145*  --   --   --   --  194  --   --   HEPARINUNFRC  --   --   --   < > <0.10* <0.10* <0.10* 0.16*  CREATININE 0.70 0.65  --   --  0.60* 0.52*  --   --   TROPONINI  --  5.21* 3.86*  --  2.98*  --   --   --   < > = values in this interval not displayed.  Estimated Creatinine Clearance: 98 mL/min (by C-G formula based on Cr of 0.52).   Assessment: HL still low rate increase to 1900 units/hr. Verified with RN heparin still infusing and no loss of access. Hgb 12.2, plt 194. No bleeding reported.  Goal of Therapy:  Heparin level 0.3-0.7 units/ml Monitor platelets by anticoagulation protocol: Yes   Plan:  Increase heparin gtt to 2200 units/hr 6hr HL Daily CBC/HL Monitor s/sx bleeding  Erin Hearing PharmD., BCPS Clinical Pharmacist Pager [phone] 04/28/2015 9:22 PM

## 2015-04-29 ENCOUNTER — Inpatient Hospital Stay (HOSPITAL_COMMUNITY): Payer: Medicare Other

## 2015-04-29 ENCOUNTER — Other Ambulatory Visit (HOSPITAL_COMMUNITY): Payer: Medicare Other

## 2015-04-29 DIAGNOSIS — E785 Hyperlipidemia, unspecified: Secondary | ICD-10-CM

## 2015-04-29 DIAGNOSIS — I4891 Unspecified atrial fibrillation: Secondary | ICD-10-CM

## 2015-04-29 DIAGNOSIS — I10 Essential (primary) hypertension: Secondary | ICD-10-CM

## 2015-04-29 LAB — HEPARIN LEVEL (UNFRACTIONATED)
HEPARIN UNFRACTIONATED: 0.36 [IU]/mL (ref 0.30–0.70)
HEPARIN UNFRACTIONATED: 0.26 [IU]/mL — AB (ref 0.30–0.70)

## 2015-04-29 LAB — LACTIC ACID, PLASMA
Lactic Acid, Venous: 0.9 mmol/L (ref 0.5–2.0)
Lactic Acid, Venous: 2 mmol/L (ref 0.5–2.0)

## 2015-04-29 LAB — COMPREHENSIVE METABOLIC PANEL
ALBUMIN: 2.2 g/dL — AB (ref 3.5–5.0)
ALK PHOS: 84 U/L (ref 38–126)
ALT: 26 U/L (ref 17–63)
AST: 15 U/L (ref 15–41)
Anion gap: 9 (ref 5–15)
BILIRUBIN TOTAL: 0.8 mg/dL (ref 0.3–1.2)
BUN: 8 mg/dL (ref 6–20)
CALCIUM: 8.3 mg/dL — AB (ref 8.9–10.3)
CO2: 22 mmol/L (ref 22–32)
CREATININE: 0.52 mg/dL — AB (ref 0.61–1.24)
Chloride: 106 mmol/L (ref 101–111)
GFR calc Af Amer: >60 mL/min (ref >60)
GLUCOSE: 117 mg/dL — AB (ref 65–99)
POTASSIUM: 3.5 mmol/L (ref 3.5–5.1)
Sodium: 137 mmol/L (ref 135–145)
TOTAL PROTEIN: 5.9 g/dL — AB (ref 6.5–8.1)

## 2015-04-29 LAB — URINALYSIS, ROUTINE W REFLEX MICROSCOPIC
Bilirubin Urine: NEGATIVE
GLUCOSE, UA: NEGATIVE mg/dL
KETONES UR: NEGATIVE mg/dL
LEUKOCYTES UA: NEGATIVE
Nitrite: NEGATIVE
PH: 7.5 (ref 5.0–8.0)
PROTEIN: NEGATIVE mg/dL
Specific Gravity, Urine: 1.005 (ref 1.005–1.030)

## 2015-04-29 LAB — CBC
HEMATOCRIT: 36.5 % — AB (ref 39.0–52.0)
HEMOGLOBIN: 12.9 g/dL — AB (ref 13.0–17.0)
MCH: 32.8 pg (ref 26.0–34.0)
MCHC: 35.3 g/dL (ref 30.0–36.0)
MCV: 92.9 fL (ref 78.0–100.0)
Platelets: 246 10*3/uL (ref 150–400)
RBC: 3.93 MIL/uL — ABNORMAL LOW (ref 4.22–5.81)
RDW: 14 % (ref 11.5–15.5)
WBC: 28 10*3/uL — AB (ref 4.0–10.5)

## 2015-04-29 LAB — CULTURE, BLOOD (ROUTINE X 2)

## 2015-04-29 LAB — URINE MICROSCOPIC-ADD ON

## 2015-04-29 LAB — PROCALCITONIN: PROCALCITONIN: 4.12 ng/mL

## 2015-04-29 LAB — BRAIN NATRIURETIC PEPTIDE: B Natriuretic Peptide: 305.1 pg/mL — ABNORMAL HIGH (ref 0.0–100.0)

## 2015-04-29 MED ORDER — PIPERACILLIN-TAZOBACTAM 3.375 G IVPB
3.3750 g | Freq: Three times a day (TID) | INTRAVENOUS | Status: DC
  Administered 2015-04-29 – 2015-05-02 (×9): 3.375 g via INTRAVENOUS
  Filled 2015-04-29 (×11): qty 50

## 2015-04-29 MED ORDER — PIPERACILLIN-TAZOBACTAM 3.375 G IVPB
3.3750 g | Freq: Three times a day (TID) | INTRAVENOUS | Status: DC
Start: 2015-04-29 — End: 2015-04-29
  Filled 2015-04-29 (×2): qty 50

## 2015-04-29 MED ORDER — VANCOMYCIN HCL IN DEXTROSE 1-5 GM/200ML-% IV SOLN
1000.0000 mg | Freq: Three times a day (TID) | INTRAVENOUS | Status: DC
  Administered 2015-04-29 – 2015-05-02 (×8): 1000 mg via INTRAVENOUS
  Filled 2015-04-29 (×10): qty 200

## 2015-04-29 MED ORDER — SODIUM CHLORIDE 0.9 % IV SOLN
1750.0000 mg | Freq: Once | INTRAVENOUS | Status: AC
  Administered 2015-04-29: 1750 mg via INTRAVENOUS
  Filled 2015-04-29: qty 1750

## 2015-04-29 NOTE — Progress Notes (Signed)
ANTIBIOTIC CONSULT NOTE - INITIAL & ANTICOAG Follow up  Pharmacy Consult for Zosyn / Vancomycin and Heparin Indication: HCAP / E. Coli / Kleb bacteremia and   No Known Allergies  Patient Measurements: Height: 5\' 11"  (180.3 cm) Weight: 192 lb 3.Alexander oz (87.2 kg) IBW/kg (Calculated) : 75.3  Vital Signs: Temp: 98.4 F (36.Alexander C) (11/28 0832) Temp Source: Oral (11/28 0832) BP: 107/70 mmHg (11/28 0955) Pulse Rate: 71 (11/28 0955) Intake/Output from previous day: 11/27 0701 - 11/28 0700 In: 856.6 [P.O.:360; I.V.:496.6] Out: 3050 [Urine:2800; Drains:250] Intake/Output from this shift:    Labs:  Recent Labs  04/27/15 1632 04/28/15 0053 04/29/15 0458  WBC  --  15.Alexander* 28.0*  HGB  --  12.2* 12.Alexander*  PLT  --  194 246  CREATININE 0.60* 0.52* 0.52*   Estimated Creatinine Clearance: 98 mL/min (by C-G formula based on Cr of 0.52). No results for input(s): VANCOTROUGH, VANCOPEAK, VANCORANDOM, GENTTROUGH, GENTPEAK, GENTRANDOM, TOBRATROUGH, TOBRAPEAK, TOBRARND, AMIKACINPEAK, AMIKACINTROU, AMIKACIN in the last 72 hours.   Microbiology: Recent Results (from the past 720 hour(s))  Culture, blood (routine x 2)     Status: None   Collection Time: 04/25/15 12:45 AM  Result Value Ref Range Status   Specimen Description BLOOD LEFT ARM  Final   Special Requests BOTTLES DRAWN AEROBIC AND ANAEROBIC 5ML  Final   Culture  Setup Time   Final    GRAM NEGATIVE RODS IN BOTH AEROBIC AND ANAEROBIC BOTTLES CRITICAL RESULT CALLED TO, READ BACK BY AND VERIFIED WITH: L FLOOD 04/25/15 @ [place] OXYTOCA   Final   Report Status 04/28/2015 FINAL  Final   Organism ID, Bacteria ESCHERICHIA COLI  Final   Organism ID, Bacteria KLEBSIELLA OXYTOCA  Final      Susceptibility   Escherichia coli - MIC*    AMPICILLIN >=32 RESISTANT Resistant     CEFAZOLIN <=4 SENSITIVE Sensitive     CEFEPIME <=1 SENSITIVE Sensitive     CEFTAZIDIME <=1 SENSITIVE Sensitive     CEFTRIAXONE  <=1 SENSITIVE Sensitive     CIPROFLOXACIN <=0.25 SENSITIVE Sensitive     GENTAMICIN <=1 SENSITIVE Sensitive     IMIPENEM <=0.25 SENSITIVE Sensitive     TRIMETH/SULFA <=20 SENSITIVE Sensitive     AMPICILLIN/SULBACTAM 16 INTERMEDIATE Intermediate     PIP/TAZO <=4 SENSITIVE Sensitive     * ESCHERICHIA COLI   Klebsiella oxytoca - MIC*    AMPICILLIN >=32 RESISTANT Resistant     CEFAZOLIN 8 SENSITIVE Sensitive     CEFEPIME <=1 SENSITIVE Sensitive     CEFTAZIDIME <=1 SENSITIVE Sensitive     CEFTRIAXONE <=1 SENSITIVE Sensitive     CIPROFLOXACIN <=0.25 SENSITIVE Sensitive     GENTAMICIN <=1 SENSITIVE Sensitive     IMIPENEM 0.5 SENSITIVE Sensitive     TRIMETH/SULFA <=20 SENSITIVE Sensitive     AMPICILLIN/SULBACTAM 8 SENSITIVE Sensitive     PIP/TAZO <=4 SENSITIVE Sensitive     * KLEBSIELLA OXYTOCA  Culture, blood (routine x 2)     Status: None (Preliminary result)   Collection Time: 04/25/15 12:53 AM  Result Value Ref Range Status   Specimen Description BLOOD RIGHT ARM  Final   Special Requests BOTTLES DRAWN AEROBIC AND ANAEROBIC 5CC  Final   Culture  Setup Time   Final    GRAM NEGATIVE RODS IN BOTH AEROBIC AND ANAEROBIC BOTTLES CRITICAL RESULT CALLED TO, READ BACK BY AND VERIFIED WITH: L FLOOD 04/25/15 @ 1110 Alexander [name] Alexander [name]  Culture   Final    ESCHERICHIA COLI CULTURE REINCUBATED FOR BETTER GROWTH    Report Status PENDING  Incomplete  Urine culture     Status: None   Collection Time: 04/25/15  1:03 AM  Result Value Ref Range Status   Specimen Description URINE, CLEAN CATCH  Final   Special Requests NONE  Final   Culture 4,000 COLONIES/mL INSIGNIFICANT GROWTH  Final   Report Status 04/26/2015 FINAL  Final  MRSA PCR Screening     Status: None   Collection Time: 04/25/15 10:36 AM  Result Value Ref Range Status   MRSA by PCR NEGATIVE NEGATIVE Final    Comment:        The GeneXpert MRSA Assay (FDA approved for NASAL specimens only), is one component of a comprehensive MRSA  colonization surveillance program. It is not intended to diagnose MRSA infection nor to guide or monitor treatment for MRSA infections.   Culture, body fluid-bottle     Status: None   Collection Time: 04/25/15 12:21 PM  Result Value Ref Range Status   Specimen Description BILE  Final   Special Requests NONE  Final   Gram Stain MULTIPLE SPECIES PRESENT  Final   Culture MULTIPLE ORGANISMS PRESENT, NONE PREDOMINANT  Final   Report Status 04/27/2015 FINAL  Final  Gram stain     Status: None   Collection Time: 04/25/15 12:21 PM  Result Value Ref Range Status   Specimen Description BILE  Final   Special Requests NONE  Final   Gram Stain   Final    DEGENERATED CELLULAR MATERIAL PRESENT ABUNDANT GRAM POSITIVE AND GRAM NEGATIVE BACTERIA PRESENT CONSISTENT WITH LOWER GI FLORA RARE WBC PRESENT,BOTH PMN AND MONONUCLEAR NO YEAST OR FUNGAL ELEMENTS SEEN    Report Status 04/25/2015 FINAL  Final    Medical History: Past Medical History  Diagnosis Date  . Arthritis   . History of depression   . Headache(784.0)   . CAD (coronary artery disease)     a. 1995 s/p BMS;  b. 2000 s/p BMS;  c. 2005 s/p stent - All stents in LV to unknown vessels.  . Essential hypertension   . HLD (hyperlipidemia)   . Migraines   . Neuropathy ([place])   . Post-polio syndrome     a. ambulates with braces.  . Shoulder pain, right   . OSA (obstructive sleep apnea)     does not want to use CPAP, using mouth guard  . Chronic pain syndrome     a. Followed @ [facility];  b. Uses 12-14 excedrins per day.    Assessment: 64 yo Alexander [name] presents on 11/24 with sepsis. Found to have acute cholecystitis with E. Coli and Kleb bacteremia. 11/24 Blood cx showed E. Coli and Kleb. Was de-escalated to ceftriaxone until today when WBC spiked up and CXR could not rule out PNA. Pharmacy consulted to broaden abx with Zosyn and vancomycin. Now day #5 of abx for E. Coli and Kleb bacteremia. Afebrile, WBC up to 28. New blood cx's sent and  urine cx pending. SCr stable, CrCl ~35ml/min. UOP good at 1.3 ml/kg/hr.  Also on heparin for chest pain. Has been in and out of afib as well. Last HL therapeutic at 0.36. Cath planned for today.  Goal of Therapy:  Vancomycin trough level 15-20 mcg/ml  Resolution of infection  Plan:  Start Zosyn 3.375 gm IV q8h (4 hour infusion) Give vancomycin 1,750mg  IV x 1, then start vancomycin 1g IV Q8 Monitor clinical picture, renal  function, VT prn F/U C&S, abx deescalation / LOT  Continue heparin gtt at 2,200 units/hr Monitor daily HL, CBC, s/s of bleed F/U s/p cath  Elenor Quinones, PharmD, [facility] Clinical Pharmacist Pager [phone] 04/29/2015 10:29 AM

## 2015-04-29 NOTE — Plan of Care (Signed)
Problem: Infection Goal: Hemodynamic stability will return to baseline for the patient by discharge Outcome: Progressing Patient will return to normal level of WBC's on IV antibiotics prior to discharge and will be able to remain free from signs and symptoms of infection, elevated temp, HR and respirations with a drop in blood pressure, any redness or drainage at his drain site and any foul odor, patient instructed to call for any of these signs and we will continue to monitor labs, Blood cultures, urine cultures, CXR and treat appropriately. Patient instructed on the importance of proper handwashing and to tell his health care providers if he doesn't see them foam in or wash their hands, will continue to monitor

## 2015-04-29 NOTE — Plan of Care (Signed)
Problem: Pain Managment: Goal: General experience of comfort will improve Outcome: Progressing Patient is able to rate his pain, duration of his pain, quality of pain and radiation of pain appropriately and ask for pain medication appropriately. He also uses diversional activities to assist with discomfort

## 2015-04-29 NOTE — Progress Notes (Signed)
[facility] Progress Note     Subjective: Pt doing very well.  No N/V, tolerated 2 meals of HH diet yesterday before being made NPO for cath today.  Urinating well.  Had a BM on 11/26.  Drain draining light clear bile.  No abdominal pain or distension.    Objective: Vital signs in last 24 hours: Temp:  [98 F (36.7 C)-98.7 F (37.1 C)] 98.4 F (36.9 C) (11/28 0832) Pulse Rate:  [77-84] 81 (11/27 2340) Resp:  [21-24] 21 (11/27 2340) BP: (118-128)/(74-84) 118/74 mmHg (11/28 0500) SpO2:  [95 %] 95 % (11/27 2340) Weight:  [87.2 kg (192 lb 3.9 oz)] 87.2 kg (192 lb 3.9 oz) (11/28 0500) Last BM Date: 04/27/15  Intake/Output from previous day: 11/27 0701 - 11/28 0700 In: 856.6 [P.O.:360; I.V.:496.6] Out: 3050 [Urine:2800; Drains:250] Intake/Output this shift:    PE: Gen:  Alert, NAD, pleasant Card:  Normal rate, irregular rhythm, no murmurs heard Pulm:  CTA, no W/R/R, great effort Abd: Soft, NT/ND, +BS, no HSM, perc chole drain with green thin clear bilious drainage   Lab Results:   Recent Labs  04/28/15 0053 04/29/15 0458  WBC 15.9* 28.0*  HGB 12.2* 12.9*  HCT 35.2* 36.5*  PLT 194 246   BMET  Recent Labs  04/28/15 0053 04/29/15 0458  NA 137 137  K 3.2* 3.5  CL 106 106  CO2 24 22  GLUCOSE 104* 117*  BUN 9 8  CREATININE 0.52* 0.52*  CALCIUM 7.9* 8.3*   PT/INR No results for input(s): LABPROT, INR in the last 72 hours. CMP     Component Value Date/Time   NA 137 04/29/2015 0458   K 3.5 04/29/2015 0458   CL 106 04/29/2015 0458   CO2 22 04/29/2015 0458   GLUCOSE 117* 04/29/2015 0458   BUN 8 04/29/2015 0458   CREATININE 0.52* 04/29/2015 0458   CALCIUM 8.3* 04/29/2015 0458   PROT 5.9* 04/29/2015 0458   ALBUMIN 2.2* 04/29/2015 0458   AST 15 04/29/2015 0458   ALT 26 04/29/2015 0458   ALKPHOS 84 04/29/2015 0458   BILITOT 0.8 04/29/2015 0458   GFRNONAA >60 04/29/2015 0458   GFRAA >60 04/29/2015 0458   Lipase     Component Value Date/Time    LIPASE 24 04/25/2015 0045       Studies/Results: Dg Chest Port 1 View  04/29/2015  CLINICAL DATA:  Shortness of breath and cough today. EXAM: PORTABLE CHEST 1 VIEW COMPARISON:  04/25/2015. FINDINGS: Trachea is midline. Heart size within normal limits. Right IJ central line has been removed. Mild patchy airspace opacification is seen bilaterally, worst in the left upper lobe. Diagnostic quality is somewhat limited by apical lordotic view. No definite pleural fluid. IMPRESSION: Mild patchy bilateral airspace opacification, worst in the left upper lobe. Findings may be due to edema or pneumonia. Electronically Signed   By: Lorin Picket M.D.   On: 04/29/2015 08:05    Anti-infectives: Anti-infectives    Start     Dose/Rate Route Frequency Ordered Stop   04/28/15 1330  cefTRIAXone (ROCEPHIN) 2 g in dextrose 5 % 50 mL IVPB     2 g 100 mL/hr over 30 Minutes Intravenous Every 24 hours 04/28/15 1252     04/25/15 1000  piperacillin-tazobactam (ZOSYN) IVPB 3.375 g  Status:  Discontinued     3.375 g 12.5 mL/hr over 240 Minutes Intravenous 3 times per day 04/25/15 0121 04/28/15 1252   04/25/15 0900  fluconazole (DIFLUCAN) IVPB 400 mg  Status:  Discontinued     400 mg 100 mL/hr over 120 Minutes Intravenous Every 24 hours 04/25/15 0821 04/25/15 1333   04/25/15 0800  vancomycin (VANCOCIN) IVPB 1000 mg/200 mL premix  Status:  Discontinued     1,000 mg 200 mL/hr over 60 Minutes Intravenous Every 8 hours 04/25/15 0720 04/25/15 1333   04/25/15 0100  piperacillin-tazobactam (ZOSYN) IVPB 3.375 g     3.375 g 100 mL/hr over 30 Minutes Intravenous  Once 04/25/15 0056 04/25/15 0308       Assessment/Plan Acute Cholecystitis s/p perc drain by IR Sepsis/Leukocytosis - 28.0 -LFT's normalized 04/28/15, no pain, perc chole draining well.  Don't feel his leukocytosis is due to an abdominal source since he's doing so well from this standpoint. -Continuing antibiotics - cultures are NGTD - final, was on  Zosyn now on rocephin -Tolerated HH diet -Consider surgery in 6-8 weeks from now.  Drain may need to stay in longer if no surgery is indicated.  There is a possibility it can be removed if tube cholangiogram became normal, but this wouldn't be done any earlier than 6-8 weeks. -Will need to follow up with Dr. Kieth Brightly at discharge to determine if chole is indicated.  Will arrange for follow up.   -Will need to be taught how to empty drain and document drainage daily -Will sign off, call with questions/concerns  NSTEMI/CAD -Pending echo, possible heart cath today per Cards -On aspirin and heparin drip    LOS: 4 days    Nat Christen 04/29/2015, 9:03 AM Pager: [phone]

## 2015-04-29 NOTE — Plan of Care (Signed)
Problem: Pain Managment: Goal: General experience of comfort will improve Outcome: Progressing Patient has some discomfort from the drain in his abdomen with some swelling since he was started on regular solid food but is able to let me know if he needs anything for pain and actually tries to fo some things to divert his attention away from the pain, will continue to monitor and patient is aware to call if he becomes uncomfortable

## 2015-04-29 NOTE — Progress Notes (Signed)
ANTICOAGULATION CONSULT NOTE - Follow Up Consult  Pharmacy Consult for Heparin  Indication: chest pain/ACS  No Known Allergies  Patient Measurements: Height: 5\' 11"  (180.3 cm) Weight: 192 lb 3.9 oz (87.2 kg) IBW/kg (Calculated) : 75.3   Vital Signs: Temp: 97.8 F (36.6 C) (11/28 1549) Temp Source: Oral (11/28 1549) BP: 117/79 mmHg (11/28 1549) Pulse Rate: 71 (11/28 1549)  Labs:  Recent Labs  04/27/15 0450 04/27/15 1041  04/27/15 1632 04/28/15 0053  04/28/15 2037 04/29/15 0458 04/29/15 1650  HGB  --   --   --   --  12.2*  --   --  12.9*  --   HCT  --   --   --   --  35.2*  --   --  36.5*  --   PLT  --   --   --   --  194  --   --  246  --   HEPARINUNFRC  --   --   < > <0.10* <0.10*  < > 0.16* 0.36 0.26*  CREATININE 0.65  --   --  0.60* 0.52*  --   --  0.52*  --   TROPONINI 5.21* 3.86*  --  2.98*  --   --   --   --   --   < > = values in this interval not displayed.  Estimated Creatinine Clearance: 98 mL/min (by C-G formula based on Cr of 0.52).   Assessment: On heparin for CP and afib.  No bleeding reported.  HL this morning was therapeutic at 0.36 at a rate of 2200 units/hr.   Repeat confirmatory HL tonight is a little low at 0.26 on 2200 units/hr. RN confirms heparin drip is running without problems. On cath lab schedule for 11/29 at 0900 am.   Goal of Therapy:  Heparin level 0.3-0.7 units/ml Monitor platelets by anticoagulation protocol: Yes   Plan:  Increase heparin gtt to 2300 units/hr Am HL Daily CBC/HL Monitor s/sx bleeding  Eudelia Bunch, Pharm.D. [id number] 04/29/2015 5:46 PM

## 2015-04-29 NOTE — Care Management Important Message (Signed)
Important Message  Patient Details  Name: Alexander [name] MRN: [id number] Date of Birth: [birth year]   Medicare Important Message Given:  Yes    Nathen May 04/29/2015, 3:38 PM

## 2015-04-29 NOTE — Progress Notes (Signed)
PATIENT DETAILS Name: Alexander [name] Age: 64 y.o. Sex: male Date of Birth: [birth year] Admit Date: 04/25/2015 Admitting Physician Raylene Miyamoto, MD [id number] [name], MD  Brief narrative:  64 year old male with history of poliomyelitis-CAD status post remote PCI-admitted with acute cholecystitis and septic shock. Admitted to the intensive care unit, started on pressors and broad-spectrum antibiotics. Seen by general surgery, subsequently had percutaneous cholecystostomy drain placed. Once improved, transferred to the hospitalist service on 11/27. Hospital course has been complicated by development of atrial fibrillation with RVR and non-STEMI.  Subjective:  Patient in bed, denies any headache, no chest pain. Currently no shortness of breath. Does have some right upper quadrant discomfort which is improving as well.  Assessment/Plan:  Septic shock due to acute acalculous cholecystitis with Escherichia coli and Klebsiella bacteremia.:  Required pressors on admission. With antibiotics and supportive care, sepsis pathophysiology has now resolved. Antibiotics changed to vancomycin and Zosyn on 04/29/2015 due to possibility of pneumonia, we'll likely give antibiotics for bacteremia for a total of 14 days. Leukocytosis has worsened on 04/29/2015, we'll defer any further GI imaging to Gen. surgery.  General surgery on board, he has cholecystectomy drain placed by IR on 04/25/2015 which will be continued. Further management per general surgery likely will have drain for 4-6 weeks. Follow repeat blood cultures.  Leukocytosis on 04/29/2015. Could be due to #1 above, chest x-ray inconclusive for pneumonia versus fluid overload, have switched antibiotics to vancomycin and Zosyn to cover for HCAP on 04/29/2015. Add flutter valve, aspiration precautions. Check lactic acid and pro-calcitonin, if okay by cardiology will give a trial of Lasix. Await echogram. UA stable. Repeat blood  cultures.  A. fib RVR: Cardiology consulted,started on amiodarone infusion for rate control, and on heparin infusion CHADS2Vasc score of 3. Echocardiogram pending. Cardiology following.  Non-STEMI: Has underlying CAD-suspect that A. fib RVR/hypotension probably precipitated non-STEMI. Continue aspirin, statin, IV heparin. Cardiology contemplating cardiac catheterization on 04/29/2015, but with leukocytosis probably prudent to wait 48 hours until a worsening infectious source is clearly ruled out.  Essential hypertension: Blood pressure controlled without antihypertensives-resume lisinopril when able  GERD: Continue PPI  History of poliomyelitis with postpolio syndrome: Significant muscle atrophy in bilateral lower extremities. Continue Neurontin. PT eval when more stable. Normally ambulates with braces and walker as needed.  Hypokalemia: Replace Will monitor.  Chronic pain syndrome/chronic headaches: Currently stable-takes Excedrin Migraine (up to 12 times daily!!).     Disposition: Remain inpatient  Antimicrobial agents  See below  Anti-infectives    Start     Dose/Rate Route Frequency Ordered Stop   04/28/15 1330  cefTRIAXone (ROCEPHIN) 2 g in dextrose 5 % 50 mL IVPB  Status:  Discontinued     2 g 100 mL/hr over 30 Minutes Intravenous Every 24 hours 04/28/15 1252 04/29/15 1006   04/25/15 1000  piperacillin-tazobactam (ZOSYN) IVPB 3.375 g  Status:  Discontinued     3.375 g 12.5 mL/hr over 240 Minutes Intravenous 3 times per day 04/25/15 0121 04/28/15 1252   04/25/15 0900  fluconazole (DIFLUCAN) IVPB 400 mg  Status:  Discontinued     400 mg 100 mL/hr over 120 Minutes Intravenous Every 24 hours 04/25/15 0821 04/25/15 1333   04/25/15 0800  vancomycin (VANCOCIN) IVPB 1000 mg/200 mL premix  Status:  Discontinued     1,000 mg 200 mL/hr over 60 Minutes Intravenous Every 8 hours 04/25/15 0720 04/25/15 1333   04/25/15 0100  piperacillin-tazobactam (  ZOSYN) IVPB 3.375 g     3.375 g 100  mL/hr over 30 Minutes Intravenous  Once 04/25/15 0056 04/25/15 0308      DVT Prophylaxis: IV Heparin gtt  Code Status: Full code   Family Communication Wife at bedside  Procedures: 11/24>>biliary perc drain  CONSULTS:  cardiology, pulmonary/intensive care and general surgery  Time spent 30 minutes-Greater than 50% of this time was spent in counseling, explanation of diagnosis, planning of further management, and coordination of care.  MEDICATIONS: Scheduled Meds: . antiseptic oral rinse  7 mL Mouth Rinse q12n4p  . aspirin  81 mg Oral Daily  . atorvastatin  80 mg Oral q1800  . chlorhexidine  15 mL Mouth Rinse BID  . gabapentin  1,200 mg Oral TID  . metoprolol tartrate  12.5 mg Oral BID  . potassium chloride  40 mEq Oral Daily   Continuous Infusions: . sodium chloride 1 mL/kg/hr (04/29/15 0657)  . amiodarone 30 mg/hr (04/29/15 0600)  . heparin 2,200 Units/hr (04/29/15 0656)   PRN Meds:.sodium chloride, sodium chloride, acetaminophen, fentaNYL (SUBLIMAZE) injection, methocarbamol, ondansetron (ZOFRAN) IV    PHYSICAL EXAM: Vital signs in last 24 hours: Filed Vitals:   04/28/15 2340 04/29/15 0500 04/29/15 0832 04/29/15 0955  BP: 124/80 118/74  107/70  Pulse: 81   71  Temp: 98.5 F (36.9 C) 98.2 F (36.8 C) 98.4 F (36.9 C)   TempSrc: Oral Oral Oral   Resp: 21     Height:  5\' 11"  (1.803 m)    Weight:  87.2 kg (192 lb 3.9 oz)    SpO2: 95%       Weight change: -2.521 kg (-5 lb 8.9 oz) Filed Weights   04/27/15 0524 04/28/15 0424 04/29/15 0500  Weight: 85.1 kg (187 lb 9.8 oz) 89.721 kg (197 lb 12.8 oz) 87.2 kg (192 lb 3.9 oz)   Body mass index is 26.82 kg/(m^2).   Gen Exam: Awake and alert with clear speech.  Neck: Supple, No JVD.   Chest: B/L Clear.   CVS: S1 S2 Regular, no murmurs.  Abdomen: soft, BS +, non tender, non distended. Gallbladder drain in place Extremities: no edema, lower extremities warm to touch. Neurologic: Non Focal.  Chronic lower  extremity weakness due to polio Skin: No Rash.   Wounds: N/A.    Intake/Output from previous day:  Intake/Output Summary (Last 24 hours) at 04/29/15 1009 Last data filed at 04/29/15 0600  Gross per 24 hour  Intake  736.6 ml  Output   3050 ml  Net -2313.4 ml     LAB RESULTS: CBC  Recent Labs Lab 04/25/15 0045 04/25/15 0100 04/25/15 1051 04/26/15 0414 04/28/15 0053 04/29/15 0458  WBC 4.2  --  23.7* 18.9* 15.9* 28.0*  HGB 13.0 13.3 11.5* 10.9* 12.2* 12.9*  HCT 37.5* 39.0 33.0* 32.0* 35.2* 36.5*  PLT 165  --  142* 145* 194 246  MCV 93.8  --  94.0 93.8 92.6 92.9  MCH 32.5  --  32.8 32.0 32.1 32.8  MCHC 34.7  --  34.8 34.1 34.7 35.3  RDW 13.2  --  13.7 14.0 13.9 14.0  LYMPHSABS 0.3*  --   --   --   --   --   MONOABS 0.1  --   --   --   --   --   EOSABS 0.0  --   --   --   --   --   BASOSABS 0.0  --   --   --   --   --  Chemistries   Recent Labs Lab 04/26/15 0414 04/27/15 0450 04/27/15 1632 04/28/15 0053 04/29/15 0458  NA 138 137 136 137 137  K 3.0* 2.8* 2.9* 3.2* 3.5  CL 111 106 106 106 106  CO2 21* 22 22 24 22   GLUCOSE 112* 97 150* 104* 117*  BUN 16 16 14 9 8   CREATININE 0.70 0.65 0.60* 0.52* 0.52*  CALCIUM 7.9* 8.1* 8.0* 7.9* 8.3*  MG 2.5*  --   --  1.9  --     CBG:  Recent Labs Lab 04/25/15 1019  GLUCAP 134*    GFR Estimated Creatinine Clearance: 98 mL/min (by C-G formula based on Cr of 0.52).  Coagulation profile  Recent Labs Lab 04/25/15 1051  INR 1.36    Cardiac Enzymes  Recent Labs Lab 04/27/15 0450 04/27/15 1041 04/27/15 1632  TROPONINI 5.21* 3.86* 2.98*    Invalid input(s): POCBNP No results for input(s): DDIMER in the last 72 hours. No results for input(s): HGBA1C in the last 72 hours. No results for input(s): CHOL, HDL, LDLCALC, TRIG, CHOLHDL, LDLDIRECT in the last 72 hours. No results for input(s): TSH, T4TOTAL, T3FREE, THYROIDAB in the last 72 hours.  Invalid input(s): FREET3 No results for input(s): VITAMINB12,  FOLATE, FERRITIN, TIBC, IRON, RETICCTPCT in the last 72 hours. No results for input(s): LIPASE, AMYLASE in the last 72 hours.  Urine Studies No results for input(s): UHGB, CRYS in the last 72 hours.  Invalid input(s): UACOL, UAPR, USPG, UPH, UTP, UGL, UKET, UBIL, UNIT, UROB, ULEU, UEPI, UWBC, URBC, UBAC, CAST, UCOM, BILUA  MICROBIOLOGY: Recent Results (from the past 240 hour(s))  Culture, blood (routine x 2)     Status: None   Collection Time: 04/25/15 12:45 AM  Result Value Ref Range Status   Specimen Description BLOOD LEFT ARM  Final   Special Requests BOTTLES DRAWN AEROBIC AND ANAEROBIC 5ML  Final   Culture  Setup Time   Final    GRAM NEGATIVE RODS IN BOTH AEROBIC AND ANAEROBIC BOTTLES CRITICAL RESULT CALLED TO, READ BACK BY AND VERIFIED WITH: L FLOOD 04/25/15 @ [place] OXYTOCA   Final   Report Status 04/28/2015 FINAL  Final   Organism ID, Bacteria ESCHERICHIA COLI  Final   Organism ID, Bacteria KLEBSIELLA OXYTOCA  Final      Susceptibility   Escherichia coli - MIC*    AMPICILLIN >=32 RESISTANT Resistant     CEFAZOLIN <=4 SENSITIVE Sensitive     CEFEPIME <=1 SENSITIVE Sensitive     CEFTAZIDIME <=1 SENSITIVE Sensitive     CEFTRIAXONE <=1 SENSITIVE Sensitive     CIPROFLOXACIN <=0.25 SENSITIVE Sensitive     GENTAMICIN <=1 SENSITIVE Sensitive     IMIPENEM <=0.25 SENSITIVE Sensitive     TRIMETH/SULFA <=20 SENSITIVE Sensitive     AMPICILLIN/SULBACTAM 16 INTERMEDIATE Intermediate     PIP/TAZO <=4 SENSITIVE Sensitive     * ESCHERICHIA COLI   Klebsiella oxytoca - MIC*    AMPICILLIN >=32 RESISTANT Resistant     CEFAZOLIN 8 SENSITIVE Sensitive     CEFEPIME <=1 SENSITIVE Sensitive     CEFTAZIDIME <=1 SENSITIVE Sensitive     CEFTRIAXONE <=1 SENSITIVE Sensitive     CIPROFLOXACIN <=0.25 SENSITIVE Sensitive     GENTAMICIN <=1 SENSITIVE Sensitive     IMIPENEM 0.5 SENSITIVE Sensitive     TRIMETH/SULFA <=20 SENSITIVE Sensitive      AMPICILLIN/SULBACTAM 8 SENSITIVE Sensitive     PIP/TAZO <=4 SENSITIVE Sensitive     *  KLEBSIELLA OXYTOCA  Culture, blood (routine x 2)     Status: None (Preliminary result)   Collection Time: 04/25/15 12:53 AM  Result Value Ref Range Status   Specimen Description BLOOD RIGHT ARM  Final   Special Requests BOTTLES DRAWN AEROBIC AND ANAEROBIC 5CC  Final   Culture  Setup Time   Final    GRAM NEGATIVE RODS IN BOTH AEROBIC AND ANAEROBIC BOTTLES CRITICAL RESULT CALLED TO, READ BACK BY AND VERIFIED WITH: L FLOOD 04/25/15 @ 70 M VESTAL    Culture   Final    ESCHERICHIA COLI CULTURE REINCUBATED FOR BETTER GROWTH    Report Status PENDING  Incomplete  Urine culture     Status: None   Collection Time: 04/25/15  1:03 AM  Result Value Ref Range Status   Specimen Description URINE, CLEAN CATCH  Final   Special Requests NONE  Final   Culture 4,000 COLONIES/mL INSIGNIFICANT GROWTH  Final   Report Status 04/26/2015 FINAL  Final  MRSA PCR Screening     Status: None   Collection Time: 04/25/15 10:36 AM  Result Value Ref Range Status   MRSA by PCR NEGATIVE NEGATIVE Final    Comment:        The GeneXpert MRSA Assay (FDA approved for NASAL specimens only), is one component of a comprehensive MRSA colonization surveillance program. It is not intended to diagnose MRSA infection nor to guide or monitor treatment for MRSA infections.   Culture, body fluid-bottle     Status: None   Collection Time: 04/25/15 12:21 PM  Result Value Ref Range Status   Specimen Description BILE  Final   Special Requests NONE  Final   Gram Stain MULTIPLE SPECIES PRESENT  Final   Culture MULTIPLE ORGANISMS PRESENT, NONE PREDOMINANT  Final   Report Status 04/27/2015 FINAL  Final  Gram stain     Status: None   Collection Time: 04/25/15 12:21 PM  Result Value Ref Range Status   Specimen Description BILE  Final   Special Requests NONE  Final   Gram Stain   Final    DEGENERATED CELLULAR MATERIAL PRESENT ABUNDANT  GRAM POSITIVE AND GRAM NEGATIVE BACTERIA PRESENT CONSISTENT WITH LOWER GI FLORA RARE WBC PRESENT,BOTH PMN AND MONONUCLEAR NO YEAST OR FUNGAL ELEMENTS SEEN    Report Status 04/25/2015 FINAL  Final    RADIOLOGY STUDIES/RESULTS: Dg Chest 2 View  04/25/2015  CLINICAL DATA:  Initial evaluation for possible sepsis. EXAM: CHEST  2 VIEW COMPARISON:  None. FINDINGS: Examination is somewhat limited by technique and lordotic angulation. Cardiac and mediastinal silhouettes grossly within normal limits. Spinal stimulator electrodes noted. Lungs are hypoinflated. Mild right basilar atelectasis. There is diffuse peribronchial thickening with mild prominence of the interstitial markings, greatest within the left mid and lower lung. Finding is nonspecific, but may reflect sequela of bronchiolitis or possible atypical/interstitial pneumonia. No consolidative airspace opacity. No pulmonary edema or pleural effusion. No pneumothorax. No acute osseus abnormality. IMPRESSION: Diffuse peribronchial thickening with prominence of the interstitial markings, greatest within the mid and lower left lung. Finding is nonspecific but may reflect sequela of bronchiolitis or possible atypical/ interstitial pneumonia. No consolidative airspace disease. Electronically Signed   By: Jeannine Boga M.D.   On: 04/25/2015 01:40   Ct Abdomen Pelvis W Contrast  04/25/2015  CLINICAL DATA:  Low abdominal pain and low chest pain since yesterday. Constipation on Tuesday and Wednesday. Fever. EXAM: CT ABDOMEN AND PELVIS WITH CONTRAST TECHNIQUE: Multidetector CT imaging of the abdomen and pelvis was performed  using the standard protocol following bolus administration of intravenous contrast. CONTRAST:  144mL OMNIPAQUE IOHEXOL 300 MG/ML  SOLN COMPARISON:  None. FINDINGS: Interstitial fibrosis in the lung bases with ground-glass alveolitis particularly on the right. Calcified granulomas in the lung bases. Coronary artery calcifications. The  gallbladder is distended. No stones are identified but there is evidence of wall thickening and edema with mild inflammatory reaction around the gallbladder. Mild intra and extrahepatic bile duct dilatation. No discrete stone or mass identified. There is infiltration around the distal aspect of the stomach and duodenal bulb region and around the head of the pancreas. This is nonspecific and could represent inflammatory change due to peptic ulcer disease, cholecystitis, or focal pancreatitis. The spleen, inferior vena cava, and retroperitoneal lymph nodes are unremarkable. Calcification of the aorta without aneurysm. Subcentimeter cysts in the right kidney. No hydronephrosis in the kidneys. Nodule in the left adrenal gland measures 18 mm diameter. Hounsfield unit measurements are indeterminate but statistically this most likely represents benign adenoma. Consider followup in 1 year with noncontrast CT. Stomach, small bowel, and colon are mostly decompressed. No free air or free fluid in the abdomen. Pelvis: Prostate gland is mildly enlarged. Bladder wall is not thickened. No free or loculated pelvic fluid collections. No pelvic mass or lymphadenopathy. Rectosigmoid colon is unremarkable. Appendix is not identified. Degenerative changes in the spine. Spondylolysis with mild spondylolisthesis at L5-S1. No destructive bone lesions. Spinal stimulator device. IMPRESSION: Inflammatory process in the right upper quadrant with distended thick-walled gallbladder, bile duct dilatation, and infiltration in the adjacent fat. Etiology is nonspecific in could indicate cholecystitis, focal pancreatitis, or peptic ulcer disease. No evidence of bowel perforation or obstruction. Interstitial fibrosis and inflammatory changes in the lung bases. Electronically Signed   By: Lucienne Capers M.D.   On: 04/25/2015 02:44   Ir Perc Cholecystostomy  04/25/2015  INDICATION: Acute cholecystitis. Patient is septic, with pharmacologic  pressure support. EXAM: ULTRASOUND AND FLUOROSCOPIC-GUIDED CHOLECYSTOSTOMY TUBE PLACEMENT COMPARISON:  CT 04/25/2015 MEDICATIONS: Fentanyl 5.0 mcg IV; Versed 25 mg IV; The patient is currently admitted to the hospital and on intravenous antibiotics. Antibiotics were administered within an appropriate time frame prior to skin puncture. ANESTHESIA/SEDATION: Total Moderate Sedation Time Twenty minutes CONTRAST:  63mL OMNIPAQUE IOHEXOL 300 MG/ML  SOLN FLUOROSCOPY TIME:  2 minutes 6 seconds COMPLICATIONS: None PROCEDURE: Informed written consent was obtained from the patient after a discussion of the risks, benefits and alternatives to treatment. Questions regarding the procedure were encouraged and answered. A timeout was performed prior to the initiation of the procedure. The right upper abdominal quadrant was prepped and draped in the usual sterile fashion, and a sterile drape was applied covering the operative field. Maximum barrier sterile technique with sterile gowns and gloves were used for the procedure. A timeout was performed prior to the initiation of the procedure. Local anesthesia was provided with 1% lidocaine with epinephrine. Ultrasound scanning of the right upper quadrant demonstrates a markedly dilated gallbladder. Of note, the patient reported pain with ultrasound imaging over the gallbladder. Utilizing a transhepatic approach, a 22 gauge needle was advanced into the gallbladder under direct ultrasound guidance. An ultrasound image was saved for documentation purposes. Appropriate intraluminal puncture was confirmed with the efflux of bile and advancement of an 0.018 wire into the gallbladder lumen. The needle was exchanged for an [place] set. A small amount of contrast was injected to confirm appropriate intraluminal positioning. Over a Benson wire, a 58.2-French Cook cholecystomy tube was advanced into the gallbladder fossa, coiled  and locked. Bile was aspirated and a small amount of contrast was  injected as several post procedural spot radiographic images were obtained in various obliquities. The catheter was secured to the skin with suture, connected to a drainage bag and a dressing was placed. The patient tolerated the procedure well without immediate post procedural complication. IMPRESSION: Status post image guided percutaneous cholecystostomy tube. Sample sent for analysis. Signed, Dulcy Fanny. Earleen Newport, DO [facility] Electronically Signed   By: Corrie Mckusick D.O.   On: 04/25/2015 12:46   Dg Chest Port 1 View  04/29/2015  CLINICAL DATA:  Shortness of breath and cough today. EXAM: PORTABLE CHEST 1 VIEW COMPARISON:  04/25/2015. FINDINGS: Trachea is midline. Heart size within normal limits. Right IJ central line has been removed. Mild patchy airspace opacification is seen bilaterally, worst in the left upper lobe. Diagnostic quality is somewhat limited by apical lordotic view. No definite pleural fluid. IMPRESSION: Mild patchy bilateral airspace opacification, worst in the left upper lobe. Findings may be due to edema or pneumonia. Electronically Signed   By: Lorin Picket M.D.   On: 04/29/2015 08:05   Dg Chest Portable 1 View  04/25/2015  CLINICAL DATA:  Central line placement.  Initial encounter. EXAM: PORTABLE CHEST 1 VIEW COMPARISON:  Chest radiograph performed earlier today at 1:09 a.m. FINDINGS: The right IJ line is noted ending about the mid SVC. The lungs are hypoexpanded. Vascular crowding and vascular congestion are noted. Patchy bilateral opacities may reflect atelectasis or possibly mild pneumonia. No definite pleural effusion or pneumothorax is seen. The cardiomediastinal silhouette is normal in size. No acute osseous abnormalities are seen. Spinal stimulation leads are partially imaged. IMPRESSION: 1. Right IJ line noted ending about the mid SVC. 2. Lungs hypoexpanded. Vascular congestion noted. Patchy bilateral airspace  opacities may reflect atelectasis or possibly mild pneumonia, more prominent than on the prior study. Electronically Signed   By: Garald Balding M.D.   On: 04/25/2015 07:11    Thurnell Lose, MD  [facility] Pager:[phone]  If 7PM-7AM, please contact night-coverage [email or web address] Password [email or web address] 04/29/2015, 10:09 AM   LOS: 4 days

## 2015-04-29 NOTE — Plan of Care (Signed)
Problem: Education: Goal: Knowledge of [facility] Education information/materials will improve Outcome: Progressing Patient has been given information about his diagnosis and will receive new information regarding any new testing, procedures and medications throughout his hospital stay, will continue to monitor his progress but as of now he is able to vebally explain what's going on in his care along with his wife who is an active participant in his care

## 2015-04-29 NOTE — Care Management Note (Addendum)
Case Management Note  Patient Details  Name: Alexander [name] MRN: [id number] Date of Birth: 1951-05-20  Subjective/Objective:  63 year old male with history of poliomyelitis-CAD status post remote PCI-admitted with acute cholecystitis and septic shock. Admitted to the intensive care unit, started on pressors and broad-spectrum antibiotics. Seen by general surgery, subsequently had percutaneous cholecystostomy drain placed. Once improved, transferred to the hospitalist service on 11/27. Hospital course has been complicated by development of atrial fibrillation with RVR and non-STEMI. 04/29/2015 he developed left upper lobe HCAP. He has underwent left heart catheterization on 04/30/2015 which was unremarkable with nonocclusive CAD.                  Action/Plan: Pt will benefit from [facility] once stable for d/c.   Expected Discharge Date:                  Expected Discharge Plan:  [place]  In-House Referral:     Discharge planning Services  CM Consult  Post Acute Care Choice:   Home with Self Care Choice offered to:     DME Arranged:    DME Agency:     HH Arranged:   N/A HH Agency:   N/A  Status of Service:  Completed.  Medicare Important Message Given:    Date Medicare IM Given:    Medicare IM give by:    Date Additional Medicare IM Given:    Additional Medicare Important Message give by:     If discussed at [place] of Stay Meetings, dates discussed:    Additional Comments:  1326 05-01-15 Jacqlyn Krauss, RN,BSN [phone] CM did speak with pt in regards to [place]. Per pt his wife is competent enough to care for the drain. Pt states if he needs HHRN he will call his PCP and he can order. CM did provide pt with the Eliquis 30 day free card. Pt will need Rx to go along with 30 day free card. [facility] has Eliquis in stock. No further needs from CM at this time.   1150 05-01-15 Jacqlyn Krauss, RN,BSN [phone] CO-PAY- $134.50. Will  make pt aware of cost. CM will provide 30 day free card. Will see if pt will be able to afford cost.   Bethena Roys, RN 04/29/2015, 2:57 PM

## 2015-04-29 NOTE — Progress Notes (Signed)
Patient Name: Alexander [name] Date of Encounter: 04/29/2015  Primary Cardiologist: new   Principal Problem:   Cholecystitis Active Problems:   Post-polio syndrome   Chronic pain syndrome   OSA (obstructive sleep apnea)   HLD (hyperlipidemia)   CAD (coronary artery disease)   Septic shock (HCC)   Hypokalemia   Essential hypertension   NSTEMI (non-ST elevated myocardial infarction) ([place])   Atrial fibrillation with RVR (HCC)   Bacteremia    SUBJECTIVE  Denies any CP or SOB. Abdominal pain improving. Denies significant pain.   CURRENT MEDS . antiseptic oral rinse  7 mL Mouth Rinse q12n4p  . aspirin  81 mg Oral Daily  . atorvastatin  80 mg Oral q1800  . cefTRIAXone (ROCEPHIN)  IV  2 g Intravenous Q24H  . chlorhexidine  15 mL Mouth Rinse BID  . gabapentin  1,200 mg Oral TID  . metoprolol tartrate  12.5 mg Oral BID  . potassium chloride  40 mEq Oral Daily    OBJECTIVE  Filed Vitals:   04/28/15 2208 04/28/15 2340 04/29/15 0500 04/29/15 0832  BP: 126/78 124/80 118/74   Pulse: 77 81    Temp:  98.5 F (36.9 C) 98.2 F (36.8 C) 98.4 F (36.9 C)  TempSrc:  Oral Oral Oral  Resp:  21    Height:   5\' 11"  (1.803 m)   Weight:   192 lb 3.9 oz (87.2 kg)   SpO2:  95%      Intake/Output Summary (Last 24 hours) at 04/29/15 0904 Last data filed at 04/29/15 0600  Gross per 24 hour  Intake  736.6 ml  Output   3050 ml  Net -2313.4 ml   Filed Weights   04/27/15 0524 04/28/15 0424 04/29/15 0500  Weight: 187 lb 9.8 oz (85.1 kg) 197 lb 12.8 oz (89.721 kg) 192 lb 3.9 oz (87.2 kg)    PHYSICAL EXAM  General: Pleasant, NAD. Neuro: Alert and oriented X 3. Moves all extremities spontaneously. Psych: Normal affect. HEENT:  Normal  Neck: Supple without bruits or JVD. Lungs:  Resp regular and unlabored. Decreased breath sound in bibasilar area Heart: RRR no s3, s4, or murmurs. Abdomen: Soft, non-tender, non-distended, BS + x 4.  Extremities: No clubbing, cyanosis or edema.  DP/PT/Radials 2+ and equal bilaterally.  Accessory Clinical Findings  CBC  Recent Labs  04/28/15 0053 04/29/15 0458  WBC 15.9* 28.0*  HGB 12.2* 12.9*  HCT 35.2* 36.5*  MCV 92.6 92.9  PLT 194 0000000   Basic Metabolic Panel  Recent Labs  04/28/15 0053 04/29/15 0458  NA 137 137  K 3.2* 3.5  CL 106 106  CO2 24 22  GLUCOSE 104* 117*  BUN 9 8  CREATININE 0.52* 0.52*  CALCIUM 7.9* 8.3*  MG 1.9  --    Liver Function Tests  Recent Labs  04/29/15 0458  AST 15  ALT 26  ALKPHOS 84  BILITOT 0.8  PROT 5.9*  ALBUMIN 2.2*   Cardiac Enzymes  Recent Labs  04/27/15 0450 04/27/15 1041 04/27/15 1632  TROPONINI 5.21* 3.86* 2.98*    TELE NSR without recurrence of afib    ECG  No new EKG  Echocardiogram  Pending echo    Radiology/Studies  Dg Chest 2 View  04/25/2015  CLINICAL DATA:  Initial evaluation for possible sepsis. EXAM: CHEST  2 VIEW COMPARISON:  None. FINDINGS: Examination is somewhat limited by technique and lordotic angulation. Cardiac and mediastinal silhouettes grossly within normal limits. Spinal stimulator electrodes noted. Lungs are hypoinflated.  Mild right basilar atelectasis. There is diffuse peribronchial thickening with mild prominence of the interstitial markings, greatest within the left mid and lower lung. Finding is nonspecific, but may reflect sequela of bronchiolitis or possible atypical/interstitial pneumonia. No consolidative airspace opacity. No pulmonary edema or pleural effusion. No pneumothorax. No acute osseus abnormality. IMPRESSION: Diffuse peribronchial thickening with prominence of the interstitial markings, greatest within the mid and lower left lung. Finding is nonspecific but may reflect sequela of bronchiolitis or possible atypical/ interstitial pneumonia. No consolidative airspace disease. Electronically Signed   By: Jeannine Boga M.D.   On: 04/25/2015 01:40   Ct Abdomen Pelvis W Contrast  04/25/2015  CLINICAL DATA:  Low  abdominal pain and low chest pain since yesterday. Constipation on Tuesday and Wednesday. Fever. EXAM: CT ABDOMEN AND PELVIS WITH CONTRAST TECHNIQUE: Multidetector CT imaging of the abdomen and pelvis was performed using the standard protocol following bolus administration of intravenous contrast. CONTRAST:  161mL OMNIPAQUE IOHEXOL 300 MG/ML  SOLN COMPARISON:  None. FINDINGS: Interstitial fibrosis in the lung bases with ground-glass alveolitis particularly on the right. Calcified granulomas in the lung bases. Coronary artery calcifications. The gallbladder is distended. No stones are identified but there is evidence of wall thickening and edema with mild inflammatory reaction around the gallbladder. Mild intra and extrahepatic bile duct dilatation. No discrete stone or mass identified. There is infiltration around the distal aspect of the stomach and duodenal bulb region and around the head of the pancreas. This is nonspecific and could represent inflammatory change due to peptic ulcer disease, cholecystitis, or focal pancreatitis. The spleen, inferior vena cava, and retroperitoneal lymph nodes are unremarkable. Calcification of the aorta without aneurysm. Subcentimeter cysts in the right kidney. No hydronephrosis in the kidneys. Nodule in the left adrenal gland measures 18 mm diameter. Hounsfield unit measurements are indeterminate but statistically this most likely represents benign adenoma. Consider followup in 1 year with noncontrast CT. Stomach, small bowel, and colon are mostly decompressed. No free air or free fluid in the abdomen. Pelvis: Prostate gland is mildly enlarged. Bladder wall is not thickened. No free or loculated pelvic fluid collections. No pelvic mass or lymphadenopathy. Rectosigmoid colon is unremarkable. Appendix is not identified. Degenerative changes in the spine. Spondylolysis with mild spondylolisthesis at L5-S1. No destructive bone lesions. Spinal stimulator device. IMPRESSION:  Inflammatory process in the right upper quadrant with distended thick-walled gallbladder, bile duct dilatation, and infiltration in the adjacent fat. Etiology is nonspecific in could indicate cholecystitis, focal pancreatitis, or peptic ulcer disease. No evidence of bowel perforation or obstruction. Interstitial fibrosis and inflammatory changes in the lung bases. Electronically Signed   By: Lucienne Capers M.D.   On: 04/25/2015 02:44   Ir Perc Cholecystostomy  04/25/2015  INDICATION: Acute cholecystitis. Patient is septic, with pharmacologic pressure support. EXAM: ULTRASOUND AND FLUOROSCOPIC-GUIDED CHOLECYSTOSTOMY TUBE PLACEMENT COMPARISON:  CT 04/25/2015 MEDICATIONS: Fentanyl 5.0 mcg IV; Versed 25 mg IV; The patient is currently admitted to the hospital and on intravenous antibiotics. Antibiotics were administered within an appropriate time frame prior to skin puncture. ANESTHESIA/SEDATION: Total Moderate Sedation Time Twenty minutes CONTRAST:  19mL OMNIPAQUE IOHEXOL 300 MG/ML  SOLN FLUOROSCOPY TIME:  2 minutes 6 seconds COMPLICATIONS: None PROCEDURE: Informed written consent was obtained from the patient after a discussion of the risks, benefits and alternatives to treatment. Questions regarding the procedure were encouraged and answered. A timeout was performed prior to the initiation of the procedure. The right upper abdominal quadrant was prepped and draped in the usual sterile fashion,  and a sterile drape was applied covering the operative field. Maximum barrier sterile technique with sterile gowns and gloves were used for the procedure. A timeout was performed prior to the initiation of the procedure. Local anesthesia was provided with 1% lidocaine with epinephrine. Ultrasound scanning of the right upper quadrant demonstrates a markedly dilated gallbladder. Of note, the patient reported pain with ultrasound imaging over the gallbladder. Utilizing a transhepatic approach, a 22 gauge needle was advanced  into the gallbladder under direct ultrasound guidance. An ultrasound image was saved for documentation purposes. Appropriate intraluminal puncture was confirmed with the efflux of bile and advancement of an 0.018 wire into the gallbladder lumen. The needle was exchanged for an [place] set. A small amount of contrast was injected to confirm appropriate intraluminal positioning. Over a Benson wire, a 68.2-French Cook cholecystomy tube was advanced into the gallbladder fossa, coiled and locked. Bile was aspirated and a small amount of contrast was injected as several post procedural spot radiographic images were obtained in various obliquities. The catheter was secured to the skin with suture, connected to a drainage bag and a dressing was placed. The patient tolerated the procedure well without immediate post procedural complication. IMPRESSION: Status post image guided percutaneous cholecystostomy tube. Sample sent for analysis. Signed, Dulcy Fanny. Earleen Newport, DO [facility] Radiology Electronically Signed   By: Corrie Mckusick D.O.   On: 04/25/2015 12:46   Dg Chest Port 1 View  04/29/2015  CLINICAL DATA:  Shortness of breath and cough today. EXAM: PORTABLE CHEST 1 VIEW COMPARISON:  04/25/2015. FINDINGS: Trachea is midline. Heart size within normal limits. Right IJ central line has been removed. Mild patchy airspace opacification is seen bilaterally, worst in the left upper lobe. Diagnostic quality is somewhat limited by apical lordotic view. No definite pleural fluid. IMPRESSION: Mild patchy bilateral airspace opacification, worst in the left upper lobe. Findings may be due to edema or pneumonia. Electronically Signed   By: Lorin Picket M.D.   On: 04/29/2015 08:05   Dg Chest Portable 1 View  04/25/2015  CLINICAL DATA:  Central line placement.  Initial encounter. EXAM: PORTABLE CHEST 1 VIEW COMPARISON:  Chest radiograph performed earlier today at 1:09 a.m.  FINDINGS: The right IJ line is noted ending about the mid SVC. The lungs are hypoexpanded. Vascular crowding and vascular congestion are noted. Patchy bilateral opacities may reflect atelectasis or possibly mild pneumonia. No definite pleural effusion or pneumothorax is seen. The cardiomediastinal silhouette is normal in size. No acute osseous abnormalities are seen. Spinal stimulation leads are partially imaged. IMPRESSION: 1. Right IJ line noted ending about the mid SVC. 2. Lungs hypoexpanded. Vascular congestion noted. Patchy bilateral airspace opacities may reflect atelectasis or possibly mild pneumonia, more prominent than on the prior study. Electronically Signed   By: Garald Balding M.D.   On: 04/25/2015 07:11    ASSESSMENT AND PLAN  1. Acute cholecystitis: s/p percutaneous drain. No plan for surgery currently - to be considered in 6 wks. Abx per IM.   - plan for diagnostic cardiac cath today.   - Will need to keep pending surgery in mind if he were to require stenting. Probably BMS or PTCA if needed  2. NSTEMI/CAD: Prior h/o stenting in 1995, 2000, and 2005.   - Trop mildly elevated in setting of #1 along with sepsis/hypotn on admission and then peaked @ 5.21 after going into rapid AF on the morning of 11/26.   - Cont asa, statin. Add bb as  bp stable.   - cath today as trop elevation higher than expected for demand ischemia. Risk and benefit of procedure explained to the patient who display clear understanding and agree to proceed. Discussed with patient possible procedural risk include bleeding, vascular injury, renal injury, arrythmia, MI, stroke and loss of limb or life.  - pending echo, done yesterday, will discuss with echo reader  3. PAF: Went into AF on 11/26 @ approx 4:30 AM. Amio added yesterday and now in sinus. Echo pending. Cont IV amio for now given ongoing recovery and risk of going back into AF. Add bb today. Hopefully can d/c amio w/in next 24-48 hrs.  CHA2DS2VASc = 3. On heparin currently in setting of ACS. Not yet clear if he will require long term [place].  - still on IV amiodarone, potentially transition to PO amio today 400mg  BID. Taper down to 200mg  BID after 7 days. Hopefully afib only occurred in the setting of sepsis and he can eventually come out of the amio once acute cholecystitis is resolved.   4. Sepsis/Bacteremia: Hemodynamically stable. Abx per IM.  - WBC went up to 28 this morning from 15, unclear cause as patient looks and feel better. Did not see any steroid given yesterday. Afebrile.   5. Hypokalemia: Persistent. Supp.  6. Essential HTN: Stable. Was on ACEI @ home.  7. HL: Cont statin.  8. Possible LUL PNA on CXR: diminished breath sound in bibasilar area.  Hilbert Corrigan PA-C Pager: [phone]   I have seen, examined and evaluated the patient this AM along with Mr. Eulas Post, [place].  After reviewing all the available data and chart,  I agree with his findings, examination as well as impression recommendations.  Overall improved today - but concerning jump in WBC (leukocytosis) today -- I do agree with Dr.Singh - would be prudent to postpone CATH until more stable.  Anticipate Cath tomorrow pending f/u of labs.  No active anginal Sx - just difficulty with breathing.   Stable in NSR on Amiodarone - agree with converting to PO 400 mg BID today - x 7 days, 200 mg BID x 7 days then 200 mg daily until post-OP Chole.  On BB (low dose), ASA & statin for CAD.  Will reassess in AM to determine +/- Cath - ok to feed today, but make NPO after MN.    Leonie Man, M.D., M.S. Interventional Cardiologist   Pager # [phone]

## 2015-04-29 NOTE — Plan of Care (Signed)
Problem: Safety: Goal: Ability to remain free from injury will improve Outcome: Completed/Met Date Met:  04/29/15 Patient utilizes his call bell appropriately and waits for assistance before attempting to get OOB as per instructions

## 2015-04-30 ENCOUNTER — Encounter (HOSPITAL_COMMUNITY)
Admission: EM | Disposition: A | Discharge status: Home / Self Care | Payer: Self-pay | Source: Home / Self Care | Attending: Internal Medicine

## 2015-04-30 ENCOUNTER — Inpatient Hospital Stay (HOSPITAL_COMMUNITY): Payer: Medicare Other

## 2015-04-30 DIAGNOSIS — Z9861 Coronary angioplasty status: Secondary | ICD-10-CM

## 2015-04-30 HISTORY — PX: CARDIAC CATHETERIZATION: SHX172

## 2015-04-30 LAB — COMPREHENSIVE METABOLIC PANEL
ALBUMIN: 2.2 g/dL — AB (ref 3.5–5.0)
ALK PHOS: 82 U/L (ref 38–126)
ALT: 24 U/L (ref 17–63)
AST: 15 U/L (ref 15–41)
Anion gap: 8 (ref 5–15)
BUN: 8 mg/dL (ref 6–20)
CALCIUM: 8.2 mg/dL — AB (ref 8.9–10.3)
CHLORIDE: 107 mmol/L (ref 101–111)
CO2: 23 mmol/L (ref 22–32)
CREATININE: 0.6 mg/dL — AB (ref 0.61–1.24)
GFR calc non Af Amer: >60 mL/min (ref >60)
GLUCOSE: 147 mg/dL — AB (ref 65–99)
Potassium: 3.3 mmol/L — ABNORMAL LOW (ref 3.5–5.1)
SODIUM: 138 mmol/L (ref 135–145)
Total Bilirubin: 0.8 mg/dL (ref 0.3–1.2)
Total Protein: 5.9 g/dL — ABNORMAL LOW (ref 6.5–8.1)

## 2015-04-30 LAB — CBC
HCT: 34.7 % — ABNORMAL LOW (ref 39.0–52.0)
HEMOGLOBIN: 11.7 g/dL — AB (ref 13.0–17.0)
MCH: 31.6 pg (ref 26.0–34.0)
MCHC: 33.7 g/dL (ref 30.0–36.0)
MCV: 93.8 fL (ref 78.0–100.0)
PLATELETS: 336 10*3/uL (ref 150–400)
RBC: 3.7 MIL/uL — ABNORMAL LOW (ref 4.22–5.81)
RDW: 14.1 % (ref 11.5–15.5)
WBC: 23.9 10*3/uL — AB (ref 4.0–10.5)

## 2015-04-30 LAB — MAGNESIUM: Magnesium: 1.9 mg/dL (ref 1.7–2.4)

## 2015-04-30 LAB — URINE CULTURE: Culture: NO GROWTH

## 2015-04-30 LAB — HEPARIN LEVEL (UNFRACTIONATED): HEPARIN UNFRACTIONATED: 0.34 [IU]/mL (ref 0.30–0.70)

## 2015-04-30 SURGERY — LEFT HEART CATH AND CORONARY ANGIOGRAPHY
Anesthesia: LOCAL

## 2015-04-30 MED ORDER — MIDAZOLAM HCL 2 MG/2ML IJ SOLN
INTRAMUSCULAR | Status: DC | PRN
  Administered 2015-04-30: 1 mg via INTRAVENOUS

## 2015-04-30 MED ORDER — SODIUM CHLORIDE 0.9 % WEIGHT BASED INFUSION: 1.0000 mL/kg/h | INTRAVENOUS | Status: DC

## 2015-04-30 MED ORDER — HEPARIN SODIUM (PORCINE) 1000 UNIT/ML IJ SOLN
INTRAMUSCULAR | Status: DC | PRN
  Administered 2015-04-30: 4400 [IU] via INTRAVENOUS

## 2015-04-30 MED ORDER — MIDAZOLAM HCL 2 MG/2ML IJ SOLN
INTRAMUSCULAR | Status: AC
  Filled 2015-04-30: qty 2

## 2015-04-30 MED ORDER — VERAPAMIL HCL 2.5 MG/ML IV SOLN
INTRAVENOUS | Status: AC
  Filled 2015-04-30: qty 2

## 2015-04-30 MED ORDER — SODIUM CHLORIDE 0.9 % IV SOLN: 250.0000 mL | INTRAVENOUS | Status: DC | PRN

## 2015-04-30 MED ORDER — VERAPAMIL HCL 2.5 MG/ML IV SOLN
INTRAVENOUS | Status: DC | PRN
  Administered 2015-04-30: 16:00:00 via INTRA_ARTERIAL

## 2015-04-30 MED ORDER — SODIUM CHLORIDE 0.9 % IJ SOLN: 3.0000 mL | Freq: Two times a day (BID) | INTRAMUSCULAR | Status: DC

## 2015-04-30 MED ORDER — IOHEXOL 350 MG/ML SOLN
INTRAVENOUS | Status: DC | PRN
  Administered 2015-04-30: 55 mL via INTRA_ARTERIAL

## 2015-04-30 MED ORDER — ONDANSETRON HCL 4 MG/2ML IJ SOLN: 4.0000 mg | Freq: Four times a day (QID) | INTRAMUSCULAR | Status: DC | PRN

## 2015-04-30 MED ORDER — SODIUM CHLORIDE 0.9 % IJ SOLN: 3.0000 mL | INTRAMUSCULAR | Status: DC | PRN

## 2015-04-30 MED ORDER — ASPIRIN 81 MG PO CHEW
81.0000 mg | CHEWABLE_TABLET | ORAL | Status: AC
  Administered 2015-04-30: 81 mg via ORAL
  Filled 2015-04-30: qty 1

## 2015-04-30 MED ORDER — FENTANYL CITRATE (PF) 100 MCG/2ML IJ SOLN
INTRAMUSCULAR | Status: DC | PRN
  Administered 2015-04-30: 25 ug via INTRAVENOUS

## 2015-04-30 MED ORDER — POTASSIUM CHLORIDE CRYS ER 20 MEQ PO TBCR
40.0000 meq | EXTENDED_RELEASE_TABLET | Freq: Once | ORAL | Status: AC
  Administered 2015-04-30: 40 meq via ORAL
  Filled 2015-04-30: qty 2

## 2015-04-30 MED ORDER — AMIODARONE HCL 200 MG PO TABS: 200.0000 mg | ORAL_TABLET | Freq: Two times a day (BID) | ORAL | Status: DC

## 2015-04-30 MED ORDER — POTASSIUM CHLORIDE 10 MEQ/100ML IV SOLN: 10.0000 meq | INTRAVENOUS | Status: DC

## 2015-04-30 MED ORDER — HEPARIN SODIUM (PORCINE) 1000 UNIT/ML IJ SOLN
INTRAMUSCULAR | Status: AC
  Filled 2015-04-30: qty 1

## 2015-04-30 MED ORDER — SODIUM CHLORIDE 0.9 % WEIGHT BASED INFUSION: 3.0000 mL/kg/h | INTRAVENOUS | Status: DC

## 2015-04-30 MED ORDER — SODIUM CHLORIDE 0.9 % IV SOLN
INTRAVENOUS | Status: AC
  Administered 2015-04-30: 17:00:00 via INTRAVENOUS

## 2015-04-30 MED ORDER — NITROGLYCERIN 1 MG/10 ML FOR IR/CATH LAB
INTRA_ARTERIAL | Status: AC
  Filled 2015-04-30: qty 10

## 2015-04-30 MED ORDER — FENTANYL CITRATE (PF) 100 MCG/2ML IJ SOLN
INTRAMUSCULAR | Status: AC
  Filled 2015-04-30: qty 2

## 2015-04-30 MED ORDER — ACETAMINOPHEN 325 MG PO TABS: 650.0000 mg | ORAL_TABLET | ORAL | Status: DC | PRN

## 2015-04-30 MED ORDER — FUROSEMIDE 10 MG/ML IJ SOLN
20.0000 mg | Freq: Once | INTRAMUSCULAR | Status: AC
  Administered 2015-04-30: 20 mg via INTRAVENOUS
  Filled 2015-04-30: qty 2

## 2015-04-30 MED ORDER — NITROGLYCERIN 1 MG/10 ML FOR IR/CATH LAB
INTRA_ARTERIAL | Status: DC | PRN
  Administered 2015-04-30: 16:00:00

## 2015-04-30 MED ORDER — HEPARIN (PORCINE) IN NACL 2-0.9 UNIT/ML-% IJ SOLN
INTRAMUSCULAR | Status: AC
  Filled 2015-04-30: qty 1000

## 2015-04-30 MED ORDER — LIDOCAINE HCL (PF) 1 % IJ SOLN
INTRAMUSCULAR | Status: AC
  Filled 2015-04-30: qty 30

## 2015-04-30 MED ORDER — AMIODARONE HCL 200 MG PO TABS
400.0000 mg | ORAL_TABLET | Freq: Two times a day (BID) | ORAL | Status: DC
  Administered 2015-04-30 – 2015-05-02 (×5): 400 mg via ORAL
  Filled 2015-04-30 (×5): qty 2

## 2015-04-30 MED ORDER — HEPARIN (PORCINE) IN NACL 100-0.45 UNIT/ML-% IJ SOLN
2300.0000 [IU]/h | INTRAMUSCULAR | Status: DC
Start: 2015-05-01 — End: 2015-05-01
  Administered 2015-04-30: 2300 [IU]/h via INTRAVENOUS
  Filled 2015-04-30: qty 250

## 2015-04-30 SURGICAL SUPPLY — 10 items
CATH INFINITI 5FR ANG PIGTAIL (CATHETERS) ×2 IMPLANT
CATH OPTITORQUE TIG 4.0 5F (CATHETERS) ×2 IMPLANT
DEVICE RAD COMP TR BAND LRG (VASCULAR PRODUCTS) ×2 IMPLANT
GLIDESHEATH SLEND A-KIT 6F 22G (SHEATH) ×2 IMPLANT
KIT HEART LEFT (KITS) ×2 IMPLANT
PACK CARDIAC CATHETERIZATION (CUSTOM PROCEDURE TRAY) ×2 IMPLANT
SYR MEDRAD MARK V 150ML (SYRINGE) ×2 IMPLANT
TRANSDUCER W/STOPCOCK (MISCELLANEOUS) ×2 IMPLANT
TUBING CIL FLEX 10 FLL-RA (TUBING) ×2 IMPLANT
WIRE SAFE-T 1.5MM-J .035X260CM (WIRE) ×2 IMPLANT

## 2015-04-30 NOTE — Progress Notes (Signed)
PATIENT DETAILS Name: Alexander [name] Age: 64 y.o. Sex: male Date of Birth: [birth year] Admit Date: 04/25/2015 Admitting Physician Raylene Miyamoto, MD [id number] [name], MD  Brief narrative:  64 year old male with history of poliomyelitis-CAD status post remote PCI-admitted with acute cholecystitis and septic shock. Admitted to the intensive care unit, started on pressors and broad-spectrum antibiotics. Seen by general surgery, subsequently had percutaneous cholecystostomy drain placed. Once improved, transferred to the hospitalist service on 11/27. Hospital course has been complicated by development of atrial fibrillation with RVR and non-STEMI.  While in hospital he was seen by general surgery, they recommended cholecystectomy drain which was placed by IR on 04/25/2015, he was also seen by cardiology and they are contemplating left heart catheterization once he is stable from infectious process. In the meantime on 04/29/2015 he had elevation in his WBC count which appears to be most likely due to left upper lobe pneumonia likely HCAP.   Subjective:  Patient in bed, denies any headache, no chest pain. Currently no shortness of breath. Does have some right upper quadrant discomfort which is improving as well.  Assessment/Plan:  Septic shock due to acute acalculous cholecystitis with Escherichia coli and Klebsiella bacteremia.:  Required pressors on admission. With antibiotics and supportive care, sepsis pathophysiology has now resolved. Antibiotics changed to vancomycin and Zosyn on 04/29/2015 due to possibility of pneumonia, we'll likely give antibiotics for bacteremia for a total of 14 days. Leukocytosis has worsened on 04/29/2015, we'll defer any further GI imaging to Gen. surgery.  General surgery on board, he has cholecystectomy drain placed by IR on 04/25/2015 which will be continued. Further management per general surgery likely will have drain for 4-6 weeks.  Follow repeat blood cultures.   Leukocytosis on 04/29/2015. Could be due to #1 above, chest x-ray suspicious for HCAP LUL versus fluid overload, have switched antibiotics to vancomycin and Zosyn to cover for HCAP on 04/29/2015. Added flutter valve, aspiration precautions. Stable lactic acid and pro-calcitonin, BNP elevated + rales & orthopnea will try low dose Lasix as well. Await echogram. UA stable. Repeat blood cultures.   A. fib RVR: Cardiology consulted,started on amiodarone infusion for rate control, and on heparin infusion CHADS2Vasc score of 3. Echocardiogram pending. Cardiology following.  Non-STEMI: Has underlying CAD-suspect that A. fib RVR/hypotension probably precipitated non-STEMI. Continue aspirin, statin, IV heparin. Cardiology contemplating cardiac catheterization .  Essential hypertension: Blood pressure controlled without antihypertensives-resume lisinopril when able  GERD: Continue PPI  History of poliomyelitis with postpolio syndrome: Significant muscle atrophy in bilateral lower extremities. Continue Neurontin. PT eval when more stable. Normally ambulates with braces and walker as needed.  Hypokalemia: Replaced Will monitor.  Chronic pain syndrome/chronic headaches: Currently stable-takes Excedrin Migraine (up to 12 times daily!!).     Disposition: Remain inpatient  Antimicrobial agents  See below  Anti-infectives    Start     Dose/Rate Route Frequency Ordered Stop   04/29/15 2000  vancomycin (VANCOCIN) IVPB 1000 mg/200 mL premix     1,000 mg 200 mL/hr over 60 Minutes Intravenous Every 8 hours 04/29/15 1033     04/29/15 1400  piperacillin-tazobactam (ZOSYN) IVPB 3.375 g     3.375 g 12.5 mL/hr over 240 Minutes Intravenous Every 8 hours 04/29/15 1133     04/29/15 1100  vancomycin (VANCOCIN) 1,750 mg in sodium chloride 0.9 % 500 mL IVPB     1,750 mg 250 mL/hr over 120 Minutes Intravenous  Once 04/29/15  1015 04/29/15 1323   04/29/15 1030   piperacillin-tazobactam (ZOSYN) IVPB 3.375 g  Status:  Discontinued     3.375 g 12.5 mL/hr over 240 Minutes Intravenous Every 8 hours 04/29/15 1015 04/29/15 1133   04/28/15 1330  cefTRIAXone (ROCEPHIN) 2 g in dextrose 5 % 50 mL IVPB  Status:  Discontinued     2 g 100 mL/hr over 30 Minutes Intravenous Every 24 hours 04/28/15 1252 04/29/15 1006   04/25/15 1000  piperacillin-tazobactam (ZOSYN) IVPB 3.375 g  Status:  Discontinued     3.375 g 12.5 mL/hr over 240 Minutes Intravenous 3 times per day 04/25/15 0121 04/28/15 1252   04/25/15 0900  fluconazole (DIFLUCAN) IVPB 400 mg  Status:  Discontinued     400 mg 100 mL/hr over 120 Minutes Intravenous Every 24 hours 04/25/15 0821 04/25/15 1333   04/25/15 0800  vancomycin (VANCOCIN) IVPB 1000 mg/200 mL premix  Status:  Discontinued     1,000 mg 200 mL/hr over 60 Minutes Intravenous Every 8 hours 04/25/15 0720 04/25/15 1333   04/25/15 0100  piperacillin-tazobactam (ZOSYN) IVPB 3.375 g     3.375 g 100 mL/hr over 30 Minutes Intravenous  Once 04/25/15 0056 04/25/15 0308      DVT Prophylaxis: IV Heparin gtt  Code Status: Full code   Family Communication Wife at bedside  Procedures: 11/24>>biliary perc drain TTE  CONSULTS:  cardiology, pulmonary/intensive care and general surgery  Time spent 30 minutes-Greater than 50% of this time was spent in counseling, explanation of diagnosis, planning of further management, and coordination of care.  MEDICATIONS: Scheduled Meds: . [START ON 05/07/2015] amiodarone  200 mg Oral BID  . amiodarone  400 mg Oral BID  . antiseptic oral rinse  7 mL Mouth Rinse q12n4p  . aspirin  81 mg Oral Daily  . atorvastatin  80 mg Oral q1800  . chlorhexidine  15 mL Mouth Rinse BID  . gabapentin  1,200 mg Oral TID  . metoprolol tartrate  12.5 mg Oral BID  . piperacillin-tazobactam (ZOSYN)  IV  3.375 g Intravenous Q8H  . potassium chloride  40 mEq Oral Daily  . potassium chloride  40 mEq Oral Once  . vancomycin   1,000 mg Intravenous Q8H   Continuous Infusions: . heparin 2,300 Units/hr (04/30/15 0600)   PRN Meds:.sodium chloride, acetaminophen, fentaNYL (SUBLIMAZE) injection, methocarbamol, ondansetron (ZOFRAN) IV    PHYSICAL EXAM: Vital signs in last 24 hours: Filed Vitals:   04/30/15 0000 04/30/15 0400 04/30/15 0709 04/30/15 0835  BP:  118/70 114/75 114/68  Pulse:  70 70 72  Temp:  98.6 F (37 C) 98.2 F (36.8 C)   TempSrc:  Oral Oral   Resp:  16 27   Height:      Weight:  86.637 kg (191 lb)    SpO2: 90% 90% 93%     Weight change: -0.563 kg (-1 lb 3.9 oz) Filed Weights   04/28/15 0424 04/29/15 0500 04/30/15 0400  Weight: 89.721 kg (197 lb 12.8 oz) 87.2 kg (192 lb 3.9 oz) 86.637 kg (191 lb)   Body mass index is 26.65 kg/(m^2).   Gen Exam: Awake and alert with clear speech.  Neck: Supple, No JVD.   Chest: B/L Clear.   CVS: S1 S2 Regular, no murmurs.  Abdomen: soft, BS +, non tender, non distended. Gallbladder drain in place Extremities: no edema, lower extremities warm to touch. Neurologic: Non Focal.  Chronic lower extremity weakness due to polio Skin: No Rash.   Wounds: N/A.  Intake/Output from previous day:  Intake/Output Summary (Last 24 hours) at 04/30/15 0952 Last data filed at 04/30/15 0600  Gross per 24 hour  Intake 4444.99 ml  Output   3655 ml  Net 789.99 ml     LAB RESULTS: CBC  Recent Labs Lab 04/25/15 0045  04/25/15 1051 04/26/15 0414 04/28/15 0053 04/29/15 0458 04/30/15 0250  WBC 4.2  --  23.7* 18.9* 15.9* 28.0* 23.9*  HGB 13.0  < > 11.5* 10.9* 12.2* 12.9* 11.7*  HCT 37.5*  < > 33.0* 32.0* 35.2* 36.5* 34.7*  PLT 165  --  142* 145* 194 246 336  MCV 93.8  --  94.0 93.8 92.6 92.9 93.8  MCH 32.5  --  32.8 32.0 32.1 32.8 31.6  MCHC 34.7  --  34.8 34.1 34.7 35.3 33.7  RDW 13.2  --  13.7 14.0 13.9 14.0 14.1  LYMPHSABS 0.3*  --   --   --   --   --   --   MONOABS 0.1  --   --   --   --   --   --   EOSABS 0.0  --   --   --   --   --   --    BASOSABS 0.0  --   --   --   --   --   --   < > = values in this interval not displayed.  Chemistries   Recent Labs Lab 04/26/15 0414 04/27/15 0450 04/27/15 1632 04/28/15 0053 04/29/15 0458 04/30/15 0250  NA 138 137 136 137 137 138  K 3.0* 2.8* 2.9* 3.2* 3.5 3.3*  CL 111 106 106 106 106 107  CO2 21* 22 22 24 22 23   GLUCOSE 112* 97 150* 104* 117* 147*  BUN 16 16 14 9 8 8   CREATININE 0.70 0.65 0.60* 0.52* 0.52* 0.60*  CALCIUM 7.9* 8.1* 8.0* 7.9* 8.3* 8.2*  MG 2.5*  --   --  1.9  --  1.9    CBG:  Recent Labs Lab 04/25/15 1019  GLUCAP 134*    GFR Estimated Creatinine Clearance: 98 mL/min (by C-G formula based on Cr of 0.6).  Coagulation profile  Recent Labs Lab 04/25/15 1051  INR 1.36    Cardiac Enzymes  Recent Labs Lab 04/27/15 0450 04/27/15 1041 04/27/15 1632  TROPONINI 5.21* 3.86* 2.98*    Invalid input(s): POCBNP No results for input(s): DDIMER in the last 72 hours. No results for input(s): HGBA1C in the last 72 hours. No results for input(s): CHOL, HDL, LDLCALC, TRIG, CHOLHDL, LDLDIRECT in the last 72 hours. No results for input(s): TSH, T4TOTAL, T3FREE, THYROIDAB in the last 72 hours.  Invalid input(s): FREET3 No results for input(s): VITAMINB12, FOLATE, FERRITIN, TIBC, IRON, RETICCTPCT in the last 72 hours. No results for input(s): LIPASE, AMYLASE in the last 72 hours.  Urine Studies No results for input(s): UHGB, CRYS in the last 72 hours.  Invalid input(s): UACOL, UAPR, USPG, UPH, UTP, UGL, UKET, UBIL, UNIT, UROB, ULEU, UEPI, UWBC, URBC, UBAC, CAST, UCOM, BILUA  MICROBIOLOGY: Recent Results (from the past 240 hour(s))  Culture, blood (routine x 2)     Status: None   Collection Time: 04/25/15 12:45 AM  Result Value Ref Range Status   Specimen Description BLOOD LEFT ARM  Final   Special Requests BOTTLES DRAWN AEROBIC AND ANAEROBIC 5ML  Final   Culture  Setup Time   Final    GRAM NEGATIVE RODS IN BOTH AEROBIC AND ANAEROBIC  BOTTLES  CRITICAL RESULT CALLED TO, READ BACK BY AND VERIFIED WITH: L FLOOD 04/25/15 @ [place] OXYTOCA   Final   Report Status 04/28/2015 FINAL  Final   Organism ID, Bacteria ESCHERICHIA COLI  Final   Organism ID, Bacteria KLEBSIELLA OXYTOCA  Final      Susceptibility   Escherichia coli - MIC*    AMPICILLIN >=32 RESISTANT Resistant     CEFAZOLIN <=4 SENSITIVE Sensitive     CEFEPIME <=1 SENSITIVE Sensitive     CEFTAZIDIME <=1 SENSITIVE Sensitive     CEFTRIAXONE <=1 SENSITIVE Sensitive     CIPROFLOXACIN <=0.25 SENSITIVE Sensitive     GENTAMICIN <=1 SENSITIVE Sensitive     IMIPENEM <=0.25 SENSITIVE Sensitive     TRIMETH/SULFA <=20 SENSITIVE Sensitive     AMPICILLIN/SULBACTAM 16 INTERMEDIATE Intermediate     PIP/TAZO <=4 SENSITIVE Sensitive     * ESCHERICHIA COLI   Klebsiella oxytoca - MIC*    AMPICILLIN >=32 RESISTANT Resistant     CEFAZOLIN 8 SENSITIVE Sensitive     CEFEPIME <=1 SENSITIVE Sensitive     CEFTAZIDIME <=1 SENSITIVE Sensitive     CEFTRIAXONE <=1 SENSITIVE Sensitive     CIPROFLOXACIN <=0.25 SENSITIVE Sensitive     GENTAMICIN <=1 SENSITIVE Sensitive     IMIPENEM 0.5 SENSITIVE Sensitive     TRIMETH/SULFA <=20 SENSITIVE Sensitive     AMPICILLIN/SULBACTAM 8 SENSITIVE Sensitive     PIP/TAZO <=4 SENSITIVE Sensitive     * KLEBSIELLA OXYTOCA  Culture, blood (routine x 2)     Status: None   Collection Time: 04/25/15 12:53 AM  Result Value Ref Range Status   Specimen Description BLOOD RIGHT ARM  Final   Special Requests BOTTLES DRAWN AEROBIC AND ANAEROBIC 5CC  Final   Culture  Setup Time   Final    GRAM NEGATIVE RODS IN BOTH AEROBIC AND ANAEROBIC BOTTLES CRITICAL RESULT CALLED TO, READ BACK BY AND VERIFIED WITH: L FLOOD 04/25/15 @ 1110 M VESTAL    Culture   Final    ESCHERICHIA COLI SUSCEPTIBILITIES PERFORMED ON PREVIOUS CULTURE WITHIN THE LAST 5 DAYS.    Report Status 04/29/2015 FINAL  Final  Urine culture     Status: None    Collection Time: 04/25/15  1:03 AM  Result Value Ref Range Status   Specimen Description URINE, CLEAN CATCH  Final   Special Requests NONE  Final   Culture 4,000 COLONIES/mL INSIGNIFICANT GROWTH  Final   Report Status 04/26/2015 FINAL  Final  MRSA PCR Screening     Status: None   Collection Time: 04/25/15 10:36 AM  Result Value Ref Range Status   MRSA by PCR NEGATIVE NEGATIVE Final    Comment:        The GeneXpert MRSA Assay (FDA approved for NASAL specimens only), is one component of a comprehensive MRSA colonization surveillance program. It is not intended to diagnose MRSA infection nor to guide or monitor treatment for MRSA infections.   Culture, body fluid-bottle     Status: None   Collection Time: 04/25/15 12:21 PM  Result Value Ref Range Status   Specimen Description BILE  Final   Special Requests NONE  Final   Gram Stain MULTIPLE SPECIES PRESENT  Final   Culture MULTIPLE ORGANISMS PRESENT, NONE PREDOMINANT  Final   Report Status 04/27/2015 FINAL  Final  Gram stain     Status: None   Collection Time: 04/25/15 12:21 PM  Result Value Ref Range Status  Specimen Description BILE  Final   Special Requests NONE  Final   Gram Stain   Final    DEGENERATED CELLULAR MATERIAL PRESENT ABUNDANT GRAM POSITIVE AND GRAM NEGATIVE BACTERIA PRESENT CONSISTENT WITH LOWER GI FLORA RARE WBC PRESENT,BOTH PMN AND MONONUCLEAR NO YEAST OR FUNGAL ELEMENTS SEEN    Report Status 04/25/2015 FINAL  Final    RADIOLOGY STUDIES/RESULTS: Dg Chest 2 View  04/25/2015  CLINICAL DATA:  Initial evaluation for possible sepsis. EXAM: CHEST  2 VIEW COMPARISON:  None. FINDINGS: Examination is somewhat limited by technique and lordotic angulation. Cardiac and mediastinal silhouettes grossly within normal limits. Spinal stimulator electrodes noted. Lungs are hypoinflated. Mild right basilar atelectasis. There is diffuse peribronchial thickening with mild prominence of the interstitial markings, greatest  within the left mid and lower lung. Finding is nonspecific, but may reflect sequela of bronchiolitis or possible atypical/interstitial pneumonia. No consolidative airspace opacity. No pulmonary edema or pleural effusion. No pneumothorax. No acute osseus abnormality. IMPRESSION: Diffuse peribronchial thickening with prominence of the interstitial markings, greatest within the mid and lower left lung. Finding is nonspecific but may reflect sequela of bronchiolitis or possible atypical/ interstitial pneumonia. No consolidative airspace disease. Electronically Signed   By: Jeannine Boga M.D.   On: 04/25/2015 01:40   Ct Abdomen Pelvis W Contrast  04/25/2015  CLINICAL DATA:  Low abdominal pain and low chest pain since yesterday. Constipation on Tuesday and Wednesday. Fever. EXAM: CT ABDOMEN AND PELVIS WITH CONTRAST TECHNIQUE: Multidetector CT imaging of the abdomen and pelvis was performed using the standard protocol following bolus administration of intravenous contrast. CONTRAST:  150mL OMNIPAQUE IOHEXOL 300 MG/ML  SOLN COMPARISON:  None. FINDINGS: Interstitial fibrosis in the lung bases with ground-glass alveolitis particularly on the right. Calcified granulomas in the lung bases. Coronary artery calcifications. The gallbladder is distended. No stones are identified but there is evidence of wall thickening and edema with mild inflammatory reaction around the gallbladder. Mild intra and extrahepatic bile duct dilatation. No discrete stone or mass identified. There is infiltration around the distal aspect of the stomach and duodenal bulb region and around the head of the pancreas. This is nonspecific and could represent inflammatory change due to peptic ulcer disease, cholecystitis, or focal pancreatitis. The spleen, inferior vena cava, and retroperitoneal lymph nodes are unremarkable. Calcification of the aorta without aneurysm. Subcentimeter cysts in the right kidney. No hydronephrosis in the kidneys. Nodule  in the left adrenal gland measures 18 mm diameter. Hounsfield unit measurements are indeterminate but statistically this most likely represents benign adenoma. Consider followup in 1 year with noncontrast CT. Stomach, small bowel, and colon are mostly decompressed. No free air or free fluid in the abdomen. Pelvis: Prostate gland is mildly enlarged. Bladder wall is not thickened. No free or loculated pelvic fluid collections. No pelvic mass or lymphadenopathy. Rectosigmoid colon is unremarkable. Appendix is not identified. Degenerative changes in the spine. Spondylolysis with mild spondylolisthesis at L5-S1. No destructive bone lesions. Spinal stimulator device. IMPRESSION: Inflammatory process in the right upper quadrant with distended thick-walled gallbladder, bile duct dilatation, and infiltration in the adjacent fat. Etiology is nonspecific in could indicate cholecystitis, focal pancreatitis, or peptic ulcer disease. No evidence of bowel perforation or obstruction. Interstitial fibrosis and inflammatory changes in the lung bases. Electronically Signed   By: Lucienne Capers M.D.   On: 04/25/2015 02:44   Ir Perc Cholecystostomy  04/25/2015  INDICATION: Acute cholecystitis. Patient is septic, with pharmacologic pressure support. EXAM: ULTRASOUND AND FLUOROSCOPIC-GUIDED CHOLECYSTOSTOMY TUBE PLACEMENT COMPARISON:  CT 04/25/2015 MEDICATIONS: Fentanyl 5.0 mcg IV; Versed 25 mg IV; The patient is currently admitted to the hospital and on intravenous antibiotics. Antibiotics were administered within an appropriate time frame prior to skin puncture. ANESTHESIA/SEDATION: Total Moderate Sedation Time Twenty minutes CONTRAST:  3mL OMNIPAQUE IOHEXOL 300 MG/ML  SOLN FLUOROSCOPY TIME:  2 minutes 6 seconds COMPLICATIONS: None PROCEDURE: Informed written consent was obtained from the patient after a discussion of the risks, benefits and alternatives to treatment. Questions regarding the procedure were encouraged and answered.  A timeout was performed prior to the initiation of the procedure. The right upper abdominal quadrant was prepped and draped in the usual sterile fashion, and a sterile drape was applied covering the operative field. Maximum barrier sterile technique with sterile gowns and gloves were used for the procedure. A timeout was performed prior to the initiation of the procedure. Local anesthesia was provided with 1% lidocaine with epinephrine. Ultrasound scanning of the right upper quadrant demonstrates a markedly dilated gallbladder. Of note, the patient reported pain with ultrasound imaging over the gallbladder. Utilizing a transhepatic approach, a 22 gauge needle was advanced into the gallbladder under direct ultrasound guidance. An ultrasound image was saved for documentation purposes. Appropriate intraluminal puncture was confirmed with the efflux of bile and advancement of an 0.018 wire into the gallbladder lumen. The needle was exchanged for an [place] set. A small amount of contrast was injected to confirm appropriate intraluminal positioning. Over a Benson wire, a 30.2-French Cook cholecystomy tube was advanced into the gallbladder fossa, coiled and locked. Bile was aspirated and a small amount of contrast was injected as several post procedural spot radiographic images were obtained in various obliquities. The catheter was secured to the skin with suture, connected to a drainage bag and a dressing was placed. The patient tolerated the procedure well without immediate post procedural complication. IMPRESSION: Status post image guided percutaneous cholecystostomy tube. Sample sent for analysis. Signed, Dulcy Fanny. Earleen Newport, DO [facility] Electronically Signed   By: Corrie Mckusick D.O.   On: 04/25/2015 12:46   Dg Chest Port 1 View  04/30/2015  CLINICAL DATA:  Shortness of breath, septic gallbladder EXAM: PORTABLE CHEST 1 VIEW COMPARISON:  04/29/2015  FINDINGS: Cardiomediastinal silhouette is stable. Thoracic and cervical spinal stimulators again noted. Persistent streaky airspace opacifications in left upper lobe. Mild basilar atelectasis no convincing pulmonary edema. IMPRESSION: Persistent streaky airspace opacification in left upper lobe suspicious for infiltrate/pneumonia. No convincing pulmonary edema. Electronically Signed   By: Lahoma Crocker M.D.   On: 04/30/2015 08:17   Dg Chest Port 1 View  04/29/2015  CLINICAL DATA:  Shortness of breath and cough today. EXAM: PORTABLE CHEST 1 VIEW COMPARISON:  04/25/2015. FINDINGS: Trachea is midline. Heart size within normal limits. Right IJ central line has been removed. Mild patchy airspace opacification is seen bilaterally, worst in the left upper lobe. Diagnostic quality is somewhat limited by apical lordotic view. No definite pleural fluid. IMPRESSION: Mild patchy bilateral airspace opacification, worst in the left upper lobe. Findings may be due to edema or pneumonia. Electronically Signed   By: Lorin Picket M.D.   On: 04/29/2015 08:05   Dg Chest Portable 1 View  04/25/2015  CLINICAL DATA:  Central line placement.  Initial encounter. EXAM: PORTABLE CHEST 1 VIEW COMPARISON:  Chest radiograph performed earlier today at 1:09 a.m. FINDINGS: The right IJ line is noted ending about the mid SVC. The lungs are hypoexpanded. Vascular crowding and vascular congestion are noted.  Patchy bilateral opacities may reflect atelectasis or possibly mild pneumonia. No definite pleural effusion or pneumothorax is seen. The cardiomediastinal silhouette is normal in size. No acute osseous abnormalities are seen. Spinal stimulation leads are partially imaged. IMPRESSION: 1. Right IJ line noted ending about the mid SVC. 2. Lungs hypoexpanded. Vascular congestion noted. Patchy bilateral airspace opacities may reflect atelectasis or possibly mild pneumonia, more prominent than on the prior study. Electronically Signed   By:  Garald Balding M.D.   On: 04/25/2015 07:11    Thurnell Lose, MD  [facility] Pager:[phone]  If 7PM-7AM, please contact night-coverage [email or web address] Password [email or web address] 04/30/2015, 9:52 AM   LOS: 5 days

## 2015-04-30 NOTE — Progress Notes (Signed)
ANTICOAGULATION CONSULT NOTE - Follow Up Consult  Pharmacy Consult for Heparin  Indication: chest pain/ACS  No Known Allergies  Patient Measurements: Height: 5\' 11"  (180.3 cm) Weight: 191 lb (86.637 kg) IBW/kg (Calculated) : 75.3   Vital Signs: Temp: 98.8 F (37.1 C) (11/29 1210) Temp Source: Oral (11/29 1210) BP: 100/66 mmHg (11/29 1614) Pulse Rate: 0 (11/29 1619)  Labs:  Recent Labs  04/28/15 0053  04/29/15 0458 04/29/15 1650 04/30/15 0250  HGB 12.2*  --  12.9*  --  11.7*  HCT 35.2*  --  36.5*  --  34.7*  PLT 194  --  246  --  336  HEPARINUNFRC <0.10*  < > 0.36 0.26* 0.34  CREATININE 0.52*  --  0.52*  --  0.60*  < > = values in this interval not displayed.  Estimated Creatinine Clearance: 98 mL/min (by C-G formula based on Cr of 0.6).   Assessment:  64 YOM on heparin for CP and afib. Restarting heparin after cath- 8 hours after sheath removal. Sheath out at 1614 per log. Last HL this am was therapeutic at 0.34units/mL on 2300 units/hr.  No excessive bleeding noted during cath. Hgb stable at 11.7, plts wnl.  Goal of Therapy:  Heparin level 0.3-0.7 units/ml Monitor platelets by anticoagulation protocol: Yes   Plan:  -restart heparin gtt at 2300 units/hr at midnight -first HL at 0600 -Monitor daily HL, CBC, s/s of bleed   Huma Imhoff D. Ger Nicks, PharmD, BCPS Clinical Pharmacist Pager: [phone] 04/30/2015 4:47 PM

## 2015-04-30 NOTE — Progress Notes (Signed)
Patient Name: Alexander [name] Date of Encounter: 04/30/2015  Primary Cardiologist: new   Principal Problem:   Cholecystitis Active Problems:   Post-polio syndrome   Chronic pain syndrome   OSA (obstructive sleep apnea)   HLD (hyperlipidemia)   CAD (coronary artery disease)   Septic shock (HCC)   Hypokalemia   Essential hypertension   NSTEMI (non-ST elevated myocardial infarction) ([place])   Atrial fibrillation with RVR (HCC)   Bacteremia    SUBJECTIVE  Denies any CP or SOB. Abdominal pain improving. Ambulated this morning without significant discomfort.   CURRENT MEDS . [START ON 05/07/2015] amiodarone  200 mg Oral BID  . amiodarone  400 mg Oral BID  . antiseptic oral rinse  7 mL Mouth Rinse q12n4p  . aspirin  81 mg Oral Daily  . atorvastatin  80 mg Oral q1800  . chlorhexidine  15 mL Mouth Rinse BID  . gabapentin  1,200 mg Oral TID  . metoprolol tartrate  12.5 mg Oral BID  . piperacillin-tazobactam (ZOSYN)  IV  3.375 g Intravenous Q8H  . potassium chloride  40 mEq Oral Daily  . potassium chloride  40 mEq Oral Once  . vancomycin  1,000 mg Intravenous Q8H    OBJECTIVE  Filed Vitals:   04/30/15 0000 04/30/15 0400 04/30/15 0709 04/30/15 0835  BP:  118/70 114/75 114/68  Pulse:  70 70 72  Temp:  98.6 F (37 C) 98.2 F (36.8 C)   TempSrc:  Oral Oral   Resp:  16 27   Height:      Weight:  191 lb (86.637 kg)    SpO2: 90% 90% 93%     Intake/Output Summary (Last 24 hours) at 04/30/15 0927 Last data filed at 04/30/15 0600  Gross per 24 hour  Intake 4444.99 ml  Output   3955 ml  Net 489.99 ml   Filed Weights   04/28/15 0424 04/29/15 0500 04/30/15 0400  Weight: 197 lb 12.8 oz (89.721 kg) 192 lb 3.9 oz (87.2 kg) 191 lb (86.637 kg)    PHYSICAL EXAM  General: Pleasant, NAD. Neuro: Alert and oriented X 3. Moves all extremities spontaneously. Psych: Normal affect. HEENT:  Normal  Neck: Supple without bruits or JVD. Lungs:  Resp regular and unlabored. Decreased  breath sound in bibasilar area Heart: RRR no s3, s4, or murmurs. Abdomen: Soft, non-tender, non-distended, BS + x 4.  Extremities: No clubbing, cyanosis or edema. DP/PT/Radials 2+ and equal bilaterally.  Accessory Clinical Findings  CBC  Recent Labs  04/29/15 0458 04/30/15 0250  WBC 28.0* 23.9*  HGB 12.9* 11.7*  HCT 36.5* 34.7*  MCV 92.9 93.8  PLT 246 [id number]   Basic Metabolic Panel  Recent Labs  04/28/15 0053 04/29/15 0458 04/30/15 0250  NA 137 137 138  K 3.2* 3.5 3.3*  CL 106 106 107  CO2 24 22 23   GLUCOSE 104* 117* 147*  BUN 9 8 8   CREATININE 0.52* 0.52* 0.60*  CALCIUM 7.9* 8.3* 8.2*  MG 1.9  --  1.9   Liver Function Tests  Recent Labs  04/29/15 0458 04/30/15 0250  AST 15 15  ALT 26 24  ALKPHOS 84 82  BILITOT 0.8 0.8  PROT 5.9* 5.9*  ALBUMIN 2.2* 2.2*   Cardiac Enzymes  Recent Labs  04/27/15 1041 04/27/15 1632  TROPONINI 3.86* 2.98*    TELE NSR without recurrence of afib    ECG  No new EKG  Echocardiogram  Pending echo    Radiology/Studies  Dg Chest 2 View  04/25/2015  CLINICAL DATA:  Initial evaluation for possible sepsis. EXAM: CHEST  2 VIEW COMPARISON:  None. FINDINGS: Examination is somewhat limited by technique and lordotic angulation. Cardiac and mediastinal silhouettes grossly within normal limits. Spinal stimulator electrodes noted. Lungs are hypoinflated. Mild right basilar atelectasis. There is diffuse peribronchial thickening with mild prominence of the interstitial markings, greatest within the left mid and lower lung. Finding is nonspecific, but may reflect sequela of bronchiolitis or possible atypical/interstitial pneumonia. No consolidative airspace opacity. No pulmonary edema or pleural effusion. No pneumothorax. No acute osseus abnormality. IMPRESSION: Diffuse peribronchial thickening with prominence of the interstitial markings, greatest within the mid and lower left lung. Finding is nonspecific but may reflect sequela of  bronchiolitis or possible atypical/ interstitial pneumonia. No consolidative airspace disease. Electronically Signed   By: Jeannine Boga M.D.   On: 04/25/2015 01:40   Ct Abdomen Pelvis W Contrast  04/25/2015  CLINICAL DATA:  Low abdominal pain and low chest pain since yesterday. Constipation on Tuesday and Wednesday. Fever. EXAM: CT ABDOMEN AND PELVIS WITH CONTRAST TECHNIQUE: Multidetector CT imaging of the abdomen and pelvis was performed using the standard protocol following bolus administration of intravenous contrast. CONTRAST:  147mL OMNIPAQUE IOHEXOL 300 MG/ML  SOLN COMPARISON:  None. FINDINGS: Interstitial fibrosis in the lung bases with ground-glass alveolitis particularly on the right. Calcified granulomas in the lung bases. Coronary artery calcifications. The gallbladder is distended. No stones are identified but there is evidence of wall thickening and edema with mild inflammatory reaction around the gallbladder. Mild intra and extrahepatic bile duct dilatation. No discrete stone or mass identified. There is infiltration around the distal aspect of the stomach and duodenal bulb region and around the head of the pancreas. This is nonspecific and could represent inflammatory change due to peptic ulcer disease, cholecystitis, or focal pancreatitis. The spleen, inferior vena cava, and retroperitoneal lymph nodes are unremarkable. Calcification of the aorta without aneurysm. Subcentimeter cysts in the right kidney. No hydronephrosis in the kidneys. Nodule in the left adrenal gland measures 18 mm diameter. Hounsfield unit measurements are indeterminate but statistically this most likely represents benign adenoma. Consider followup in 1 year with noncontrast CT. Stomach, small bowel, and colon are mostly decompressed. No free air or free fluid in the abdomen. Pelvis: Prostate gland is mildly enlarged. Bladder wall is not thickened. No free or loculated pelvic fluid collections. No pelvic mass or  lymphadenopathy. Rectosigmoid colon is unremarkable. Appendix is not identified. Degenerative changes in the spine. Spondylolysis with mild spondylolisthesis at L5-S1. No destructive bone lesions. Spinal stimulator device. IMPRESSION: Inflammatory process in the right upper quadrant with distended thick-walled gallbladder, bile duct dilatation, and infiltration in the adjacent fat. Etiology is nonspecific in could indicate cholecystitis, focal pancreatitis, or peptic ulcer disease. No evidence of bowel perforation or obstruction. Interstitial fibrosis and inflammatory changes in the lung bases. Electronically Signed   By: Lucienne Capers M.D.   On: 04/25/2015 02:44   Ir Perc Cholecystostomy  04/25/2015  INDICATION: Acute cholecystitis. Patient is septic, with pharmacologic pressure support. EXAM: ULTRASOUND AND FLUOROSCOPIC-GUIDED CHOLECYSTOSTOMY TUBE PLACEMENT COMPARISON:  CT 04/25/2015 MEDICATIONS: Fentanyl 5.0 mcg IV; Versed 25 mg IV; The patient is currently admitted to the hospital and on intravenous antibiotics. Antibiotics were administered within an appropriate time frame prior to skin puncture. ANESTHESIA/SEDATION: Total Moderate Sedation Time Twenty minutes CONTRAST:  41mL OMNIPAQUE IOHEXOL 300 MG/ML  SOLN FLUOROSCOPY TIME:  2 minutes 6 seconds COMPLICATIONS: None PROCEDURE: Informed written consent was obtained from the patient after  a discussion of the risks, benefits and alternatives to treatment. Questions regarding the procedure were encouraged and answered. A timeout was performed prior to the initiation of the procedure. The right upper abdominal quadrant was prepped and draped in the usual sterile fashion, and a sterile drape was applied covering the operative field. Maximum barrier sterile technique with sterile gowns and gloves were used for the procedure. A timeout was performed prior to the initiation of the procedure. Local anesthesia was provided with 1% lidocaine with epinephrine.  Ultrasound scanning of the right upper quadrant demonstrates a markedly dilated gallbladder. Of note, the patient reported pain with ultrasound imaging over the gallbladder. Utilizing a transhepatic approach, a 22 gauge needle was advanced into the gallbladder under direct ultrasound guidance. An ultrasound image was saved for documentation purposes. Appropriate intraluminal puncture was confirmed with the efflux of bile and advancement of an 0.018 wire into the gallbladder lumen. The needle was exchanged for an [place] set. A small amount of contrast was injected to confirm appropriate intraluminal positioning. Over a Benson wire, a 22.2-French Cook cholecystomy tube was advanced into the gallbladder fossa, coiled and locked. Bile was aspirated and a small amount of contrast was injected as several post procedural spot radiographic images were obtained in various obliquities. The catheter was secured to the skin with suture, connected to a drainage bag and a dressing was placed. The patient tolerated the procedure well without immediate post procedural complication. IMPRESSION: Status post image guided percutaneous cholecystostomy tube. Sample sent for analysis. Signed, Dulcy Fanny. Earleen Newport, DO [facility] Radiology Electronically Signed   By: Corrie Mckusick D.O.   On: 04/25/2015 12:46   Dg Chest Port 1 View  04/30/2015  CLINICAL DATA:  Shortness of breath, septic gallbladder EXAM: PORTABLE CHEST 1 VIEW COMPARISON:  04/29/2015 FINDINGS: Cardiomediastinal silhouette is stable. Thoracic and cervical spinal stimulators again noted. Persistent streaky airspace opacifications in left upper lobe. Mild basilar atelectasis no convincing pulmonary edema. IMPRESSION: Persistent streaky airspace opacification in left upper lobe suspicious for infiltrate/pneumonia. No convincing pulmonary edema. Electronically Signed   By: Lahoma Crocker M.D.   On: 04/30/2015 08:17   Dg Chest Port  1 View  04/29/2015  CLINICAL DATA:  Shortness of breath and cough today. EXAM: PORTABLE CHEST 1 VIEW COMPARISON:  04/25/2015. FINDINGS: Trachea is midline. Heart size within normal limits. Right IJ central line has been removed. Mild patchy airspace opacification is seen bilaterally, worst in the left upper lobe. Diagnostic quality is somewhat limited by apical lordotic view. No definite pleural fluid. IMPRESSION: Mild patchy bilateral airspace opacification, worst in the left upper lobe. Findings may be due to edema or pneumonia. Electronically Signed   By: Lorin Picket M.D.   On: 04/29/2015 08:05   Dg Chest Portable 1 View  04/25/2015  CLINICAL DATA:  Central line placement.  Initial encounter. EXAM: PORTABLE CHEST 1 VIEW COMPARISON:  Chest radiograph performed earlier today at 1:09 a.m. FINDINGS: The right IJ line is noted ending about the mid SVC. The lungs are hypoexpanded. Vascular crowding and vascular congestion are noted. Patchy bilateral opacities may reflect atelectasis or possibly mild pneumonia. No definite pleural effusion or pneumothorax is seen. The cardiomediastinal silhouette is normal in size. No acute osseous abnormalities are seen. Spinal stimulation leads are partially imaged. IMPRESSION: 1. Right IJ line noted ending about the mid SVC. 2. Lungs hypoexpanded. Vascular congestion noted. Patchy bilateral airspace opacities may reflect atelectasis or possibly mild pneumonia, more prominent than on the  prior study. Electronically Signed   By: Garald Balding M.D.   On: 04/25/2015 07:11    ASSESSMENT AND PLAN  1. Acute cholecystitis: s/p percutaneous drain. No plan for surgery currently - to be considered in 6 wks. Abx per IM.   - plan for diagnostic cardiac cath today after delayed yesterday given WBC jump.   - Will need to keep pending surgery in mind if he were to require stenting. Probably BMS or PTCA if needed  2. NSTEMI/CAD: Prior h/o stenting in 1995, 2000, and 2005.     - Trop mildly elevated in setting of #1 along with sepsis/hypotn on admission and then peaked @ 5.21 after going into rapid AF on the morning of 11/26.   - Cont asa, statin. Add bb as bp stable.   - cath today as trop elevation higher than expected for demand ischemia. Risk and benefit of procedure explained to the patient who display clear understanding and agree to proceed. Discussed with patient possible procedural risk include bleeding, vascular injury, renal injury, arrythmia, MI, stroke and loss of limb or life.  - pending echo, done 11/27, will discuss with echo reader  3. PAF: Went into AF on 11/26 @ approx 4:30 AM. Amio added yesterday and now in sinus. Echo pending. Cont IV amio for now given ongoing recovery and risk of going back into AF. Add bb today. Hopefully can convert to by mouth amio w/in next 24-48 hrs. CHA2DS2VASc = 3. On heparin currently in setting of ACS. Not yet clear if he will require long term [place].  - convert to PO amiodarone 400mg  BID for 7 days, then 200mg  BID for 7 days, then 200mg  daily until post op   4. Sepsis/Bacteremia: Hemodynamically stable. Abx per IM.  - WBC 12 --> 28 --> 23, unclear cause as patient looks and feel better.   5. Hypokalemia: Persistent. Supp.  6. Essential HTN: Stable. Was on ACEI @ home.  7. HL: Cont statin.  8. Possible LUL PNA on CXR: diminished breath sound in bibasilar area, otherwise CTA without obvious rale.   Hilbert Corrigan PA-C Pager: [phone]  I have seen, examined and evaluated the patient this AM along with Mr. Eulas Post, [place].  After reviewing all the available data and chart,  I agree with his findings, examination as well as impression recommendations.  Patient continues to look and feel well. Is ambulating without any difficulty. No further A. Fib. White count is now improved. Antibiotics were changed around to treat possible pneumonia. This may be the reason why the white count is decreased. At  this point it is fine for catheterization. There may be some concern about his volume status tolerating diuresis which may be auto diuresis. Catheterization shows left ventricle end-diastolic pressures gives a volume status assessment.  Plan at this point his cardiac catheterization with possible PCI. If PCI is indicated would recommend bare-metal stent in order to further delay cholecystectomy.  As for the atrial fibrillation, my recommendation would be to switch to oral amiodarone taper as described once he is stable post catheterization. Plan would be to continue oral amiodarone for atrial fibrillation suppression until post-op chole.  Still awaiting read on Echo -- will review myself   HARDING, Leonie Green, M.D., M.S. Interventional Cardiologist   Pager # [phone]

## 2015-04-30 NOTE — Plan of Care (Signed)
Problem: Pain Managment: Goal: General experience of comfort will improve Outcome: Completed/Met Date Met:  04/30/15 Discussed pain with patient and need to notify RN if any pain arises

## 2015-04-30 NOTE — Interval H&P Note (Signed)
Cath Lab Visit (complete for each Cath Lab visit)  Clinical Evaluation Leading to the Procedure:   ACS: Yes.    Non-ACS:    Anginal Classification: CCS III  Anti-ischemic medical therapy: Maximal Therapy (2 or more classes of medications)  Non-Invasive Test Results: No non-invasive testing performed  Prior CABG: No previous CABG      History and Physical Interval Note:  04/30/2015 3:42 PM  Alexander [name]  has presented today for surgery, with the diagnosis of NSTEMI  The various methods of treatment have been discussed with the patient and family. After consideration of risks, benefits and other options for treatment, the patient has consented to  Procedure(s): Left Heart Cath and Coronary Angiography (N/A) as a surgical intervention .  [name] history has been reviewed, patient examined, no change in status, stable for surgery.  I have reviewed the patient's chart and labs.  Questions were answered to the patient's satisfaction.     Dayan Kreis A

## 2015-04-30 NOTE — H&P (View-Only) (Signed)
Patient Name: Alexander [name] Date of Encounter: 04/30/2015  Primary Cardiologist: new   Principal Problem:   Cholecystitis Active Problems:   Post-polio syndrome   Chronic pain syndrome   OSA (obstructive sleep apnea)   HLD (hyperlipidemia)   CAD (coronary artery disease)   Septic shock (HCC)   Hypokalemia   Essential hypertension   NSTEMI (non-ST elevated myocardial infarction) ([place])   Atrial fibrillation with RVR (HCC)   Bacteremia    SUBJECTIVE  Denies any CP or SOB. Abdominal pain improving. Ambulated this morning without significant discomfort.   CURRENT MEDS . [START ON 05/07/2015] amiodarone  200 mg Oral BID  . amiodarone  400 mg Oral BID  . antiseptic oral rinse  7 mL Mouth Rinse q12n4p  . aspirin  81 mg Oral Daily  . atorvastatin  80 mg Oral q1800  . chlorhexidine  15 mL Mouth Rinse BID  . gabapentin  1,200 mg Oral TID  . metoprolol tartrate  12.5 mg Oral BID  . piperacillin-tazobactam (ZOSYN)  IV  3.375 g Intravenous Q8H  . potassium chloride  40 mEq Oral Daily  . potassium chloride  40 mEq Oral Once  . vancomycin  1,000 mg Intravenous Q8H    OBJECTIVE  Filed Vitals:   04/30/15 0000 04/30/15 0400 04/30/15 0709 04/30/15 0835  BP:  118/70 114/75 114/68  Pulse:  70 70 72  Temp:  98.6 F (37 C) 98.2 F (36.8 C)   TempSrc:  Oral Oral   Resp:  16 27   Height:      Weight:  191 lb (86.637 kg)    SpO2: 90% 90% 93%     Intake/Output Summary (Last 24 hours) at 04/30/15 0927 Last data filed at 04/30/15 0600  Gross per 24 hour  Intake 4444.99 ml  Output   3955 ml  Net 489.99 ml   Filed Weights   04/28/15 0424 04/29/15 0500 04/30/15 0400  Weight: 197 lb 12.8 oz (89.721 kg) 192 lb 3.9 oz (87.2 kg) 191 lb (86.637 kg)    PHYSICAL EXAM  General: Pleasant, NAD. Neuro: Alert and oriented X 3. Moves all extremities spontaneously. Psych: Normal affect. HEENT:  Normal  Neck: Supple without bruits or JVD. Lungs:  Resp regular and unlabored. Decreased  breath sound in bibasilar area Heart: RRR no s3, s4, or murmurs. Abdomen: Soft, non-tender, non-distended, BS + x 4.  Extremities: No clubbing, cyanosis or edema. DP/PT/Radials 2+ and equal bilaterally.  Accessory Clinical Findings  CBC  Recent Labs  04/29/15 0458 04/30/15 0250  WBC 28.0* 23.9*  HGB 12.9* 11.7*  HCT 36.5* 34.7*  MCV 92.9 93.8  PLT 246 [id number]   Basic Metabolic Panel  Recent Labs  04/28/15 0053 04/29/15 0458 04/30/15 0250  NA 137 137 138  K 3.2* 3.5 3.3*  CL 106 106 107  CO2 24 22 23   GLUCOSE 104* 117* 147*  BUN 9 8 8   CREATININE 0.52* 0.52* 0.60*  CALCIUM 7.9* 8.3* 8.2*  MG 1.9  --  1.9   Liver Function Tests  Recent Labs  04/29/15 0458 04/30/15 0250  AST 15 15  ALT 26 24  ALKPHOS 84 82  BILITOT 0.8 0.8  PROT 5.9* 5.9*  ALBUMIN 2.2* 2.2*   Cardiac Enzymes  Recent Labs  04/27/15 1041 04/27/15 1632  TROPONINI 3.86* 2.98*    TELE NSR without recurrence of afib    ECG  No new EKG  Echocardiogram  Pending echo    Radiology/Studies  Dg Chest 2 View  04/25/2015  CLINICAL DATA:  Initial evaluation for possible sepsis. EXAM: CHEST  2 VIEW COMPARISON:  None. FINDINGS: Examination is somewhat limited by technique and lordotic angulation. Cardiac and mediastinal silhouettes grossly within normal limits. Spinal stimulator electrodes noted. Lungs are hypoinflated. Mild right basilar atelectasis. There is diffuse peribronchial thickening with mild prominence of the interstitial markings, greatest within the left mid and lower lung. Finding is nonspecific, but may reflect sequela of bronchiolitis or possible atypical/interstitial pneumonia. No consolidative airspace opacity. No pulmonary edema or pleural effusion. No pneumothorax. No acute osseus abnormality. IMPRESSION: Diffuse peribronchial thickening with prominence of the interstitial markings, greatest within the mid and lower left lung. Finding is nonspecific but may reflect sequela of  bronchiolitis or possible atypical/ interstitial pneumonia. No consolidative airspace disease. Electronically Signed   By: Jeannine Boga M.D.   On: 04/25/2015 01:40   Ct Abdomen Pelvis W Contrast  04/25/2015  CLINICAL DATA:  Low abdominal pain and low chest pain since yesterday. Constipation on Tuesday and Wednesday. Fever. EXAM: CT ABDOMEN AND PELVIS WITH CONTRAST TECHNIQUE: Multidetector CT imaging of the abdomen and pelvis was performed using the standard protocol following bolus administration of intravenous contrast. CONTRAST:  18mL OMNIPAQUE IOHEXOL 300 MG/ML  SOLN COMPARISON:  None. FINDINGS: Interstitial fibrosis in the lung bases with ground-glass alveolitis particularly on the right. Calcified granulomas in the lung bases. Coronary artery calcifications. The gallbladder is distended. No stones are identified but there is evidence of wall thickening and edema with mild inflammatory reaction around the gallbladder. Mild intra and extrahepatic bile duct dilatation. No discrete stone or mass identified. There is infiltration around the distal aspect of the stomach and duodenal bulb region and around the head of the pancreas. This is nonspecific and could represent inflammatory change due to peptic ulcer disease, cholecystitis, or focal pancreatitis. The spleen, inferior vena cava, and retroperitoneal lymph nodes are unremarkable. Calcification of the aorta without aneurysm. Subcentimeter cysts in the right kidney. No hydronephrosis in the kidneys. Nodule in the left adrenal gland measures 18 mm diameter. Hounsfield unit measurements are indeterminate but statistically this most likely represents benign adenoma. Consider followup in 1 year with noncontrast CT. Stomach, small bowel, and colon are mostly decompressed. No free air or free fluid in the abdomen. Pelvis: Prostate gland is mildly enlarged. Bladder wall is not thickened. No free or loculated pelvic fluid collections. No pelvic mass or  lymphadenopathy. Rectosigmoid colon is unremarkable. Appendix is not identified. Degenerative changes in the spine. Spondylolysis with mild spondylolisthesis at L5-S1. No destructive bone lesions. Spinal stimulator device. IMPRESSION: Inflammatory process in the right upper quadrant with distended thick-walled gallbladder, bile duct dilatation, and infiltration in the adjacent fat. Etiology is nonspecific in could indicate cholecystitis, focal pancreatitis, or peptic ulcer disease. No evidence of bowel perforation or obstruction. Interstitial fibrosis and inflammatory changes in the lung bases. Electronically Signed   By: Lucienne Capers M.D.   On: 04/25/2015 02:44   Ir Perc Cholecystostomy  04/25/2015  INDICATION: Acute cholecystitis. Patient is septic, with pharmacologic pressure support. EXAM: ULTRASOUND AND FLUOROSCOPIC-GUIDED CHOLECYSTOSTOMY TUBE PLACEMENT COMPARISON:  CT 04/25/2015 MEDICATIONS: Fentanyl 5.0 mcg IV; Versed 25 mg IV; The patient is currently admitted to the hospital and on intravenous antibiotics. Antibiotics were administered within an appropriate time frame prior to skin puncture. ANESTHESIA/SEDATION: Total Moderate Sedation Time Twenty minutes CONTRAST:  48mL OMNIPAQUE IOHEXOL 300 MG/ML  SOLN FLUOROSCOPY TIME:  2 minutes 6 seconds COMPLICATIONS: None PROCEDURE: Informed written consent was obtained from the patient after  a discussion of the risks, benefits and alternatives to treatment. Questions regarding the procedure were encouraged and answered. A timeout was performed prior to the initiation of the procedure. The right upper abdominal quadrant was prepped and draped in the usual sterile fashion, and a sterile drape was applied covering the operative field. Maximum barrier sterile technique with sterile gowns and gloves were used for the procedure. A timeout was performed prior to the initiation of the procedure. Local anesthesia was provided with 1% lidocaine with epinephrine.  Ultrasound scanning of the right upper quadrant demonstrates a markedly dilated gallbladder. Of note, the patient reported pain with ultrasound imaging over the gallbladder. Utilizing a transhepatic approach, a 22 gauge needle was advanced into the gallbladder under direct ultrasound guidance. An ultrasound image was saved for documentation purposes. Appropriate intraluminal puncture was confirmed with the efflux of bile and advancement of an 0.018 wire into the gallbladder lumen. The needle was exchanged for an [place] set. A small amount of contrast was injected to confirm appropriate intraluminal positioning. Over a [place] wire, a 70.2-French [place] cholecystomy tube was advanced into the gallbladder fossa, coiled and locked. Bile was aspirated and a small amount of contrast was injected as several post procedural spot radiographic images were obtained in various obliquities. The catheter was secured to the skin with suture, connected to a drainage bag and a dressing was placed. The patient tolerated the procedure well without immediate post procedural complication. IMPRESSION: Status post image guided percutaneous cholecystostomy tube. Sample sent for analysis. Signed, Dulcy Fanny. Earleen Newport, DO [facility] Rad[facility] Electronically Signed   By: Corrie Mckusick D.O.   On: 04/25/2015 12:46   Dg Chest Port 1 View  04/30/2015  CLINICAL DATA:  Shortness of breath, septic gallbladder EXAM: PORTABLE CHEST 1 VIEW COMPARISON:  04/29/2015 FINDINGS: Cardiomediastinal silhouette is stable. Thoracic and cervical spinal stimulators again noted. Persistent streaky airspace opacifications in left upper lobe. Mild basilar atelectasis no convincing pulmonary edema. IMPRESSION: Persistent streaky airspace opacification in left upper lobe suspicious for infiltrate/pneumonia. No convincing pulmonary edema. Electronically Signed   By: Lahoma Crocker M.D.   On: 04/30/2015 08:17   Dg Chest Port  1 View  04/29/2015  CLINICAL DATA:  Shortness of breath and cough today. EXAM: PORTABLE CHEST 1 VIEW COMPARISON:  04/25/2015. FINDINGS: Trachea is midline. Heart size within normal limits. Right IJ central line has been removed. Mild patchy airspace opacification is seen bilaterally, worst in the left upper lobe. Diagnostic quality is somewhat limited by apical lordotic view. No definite pleural fluid. IMPRESSION: Mild patchy bilateral airspace opacification, worst in the left upper lobe. Findings may be due to edema or pneumonia. Electronically Signed   By: Lorin Picket M.D.   On: 04/29/2015 08:05   Dg Chest Portable 1 View  04/25/2015  CLINICAL DATA:  Central line placement.  Initial encounter. EXAM: PORTABLE CHEST 1 VIEW COMPARISON:  Chest radiograph performed earlier today at 1:09 a.m. FINDINGS: The right IJ line is noted ending about the mid SVC. The lungs are hypoexpanded. Vascular crowding and vascular congestion are noted. Patchy bilateral opacities may reflect atelectasis or possibly mild pneumonia. No definite pleural effusion or pneumothorax is seen. The cardiomediastinal silhouette is normal in size. No acute osseous abnormalities are seen. Spinal stimulation leads are partially imaged. IMPRESSION: 1. Right IJ line noted ending about the mid SVC. 2. Lungs hypoexpanded. Vascular congestion noted. Patchy bilateral airspace opacities may reflect atelectasis or possibly mild pneumonia, more prominent than on the  prior study. Electronically Signed   By: Garald Balding M.D.   On: 04/25/2015 07:11    ASSESSMENT AND PLAN  1. Acute cholecystitis: s/p percutaneous drain. No plan for surgery currently - to be considered in 6 wks. Abx per IM.   - plan for diagnostic cardiac cath today after delayed yesterday given WBC jump.   - Will need to keep pending surgery in mind if he were to require stenting. Probably BMS or PTCA if needed  2. NSTEMI/CAD: Prior h/o stenting in 1995, 2000, and 2005.     - Trop mildly elevated in setting of #1 along with sepsis/hypotn on admission and then peaked @ 5.21 after going into rapid AF on the morning of 11/26.   - Cont asa, statin. Add bb as bp stable.   - cath today as trop elevation higher than expected for demand ischemia. Risk and benefit of procedure explained to the patient who display clear understanding and agree to proceed. Discussed with patient possible procedural risk include bleeding, vascular injury, renal injury, arrythmia, MI, stroke and loss of limb or life.  - pending echo, done 11/27, will discuss with echo reader  3. PAF: Went into AF on 11/26 @ approx 4:30 AM. Amio added yesterday and now in sinus. Echo pending. Cont IV amio for now given ongoing recovery and risk of going back into AF. Add bb today. Hopefully can convert to by mouth amio w/in next 24-48 hrs. CHA2DS2VASc = 3. On heparin currently in setting of ACS. Not yet clear if he will require long term [place].  - convert to PO amiodarone 400mg  BID for 7 days, then 200mg  BID for 7 days, then 200mg  daily until post op   4. Sepsis/Bacteremia: Hemodynamically stable. Abx per IM.  - WBC 12 --> 28 --> 23, unclear cause as patient looks and feel better.   5. Hypokalemia: Persistent. Supp.  6. Essential HTN: Stable. Was on ACEI @ home.  7. HL: Cont statin.  8. Possible LUL PNA on CXR: diminished breath sound in bibasilar area, otherwise CTA without obvious rale.   Hilbert Corrigan PA-C Pager: [phone]  I have seen, examined and evaluated the patient this AM along with Mr. Eulas Post, [place].  After reviewing all the available data and chart,  I agree with his findings, examination as well as impression recommendations.  Patient continues to look and feel well. Is ambulating without any difficulty. No further A. Fib. White count is now improved. Antibiotics were changed around to treat possible pneumonia. This may be the reason why the white count is decreased. At  this point it is fine for catheterization. There may be some concern about his volume status tolerating diuresis which may be auto diuresis. Catheterization shows left ventricle end-diastolic pressures gives a volume status assessment.  Plan at this point his cardiac catheterization with possible PCI. If PCI is indicated would recommend bare-metal stent in order to further delay cholecystectomy.  As for the atrial fibrillation, my recommendation would be to switch to oral amiodarone taper as described once he is stable post catheterization. Plan would be to continue oral amiodarone for atrial fibrillation suppression until post-op chole.  Still awaiting read on Echo -- will review myself   Lekisha Mcghee, Leonie Green, M.D., M.S. Interventional Cardiologist   Pager # [phone]

## 2015-04-30 NOTE — Progress Notes (Signed)
ANTICOAGULATION CONSULT NOTE - Follow Up Consult  Pharmacy Consult for Heparin  Indication: chest pain/ACS  No Known Allergies  Patient Measurements: Height: 5\' 11"  (180.3 cm) Weight: 191 lb (86.637 kg) IBW/kg (Calculated) : 75.3   Vital Signs: Temp: 98.2 F (36.8 C) (11/29 0709) Temp Source: Oral (11/29 0709) BP: 114/75 mmHg (11/29 0709) Pulse Rate: 70 (11/29 0709)  Labs:  Recent Labs  04/27/15 1041  04/27/15 1632  04/28/15 0053  04/29/15 0458 04/29/15 1650 04/30/15 0250  HGB  --   --   --   < > 12.2*  --  12.9*  --  11.7*  HCT  --   --   --   --  35.2*  --  36.5*  --  34.7*  PLT  --   --   --   --  194  --  246  --  336  HEPARINUNFRC  --   < > <0.10*  --  <0.10*  < > 0.36 0.26* 0.34  CREATININE  --   < > 0.60*  --  0.52*  --  0.52*  --  0.60*  TROPONINI 3.86*  --  2.98*  --   --   --   --   --   --   < > = values in this interval not displayed.  Estimated Creatinine Clearance: 98 mL/min (by C-G formula based on Cr of 0.6).   Assessment: On heparin for CP and afib. Cath postponed for today at 0900. Last HL this am was therapeutic at 0.34. Hgb stable at 11.7, plts wnl. No s/s of bleed.  Goal of Therapy:  Heparin level 0.3-0.7 units/ml Monitor platelets by anticoagulation protocol: Yes   Plan:  Continue heparin gtt at 2,300 units/hr Monitor daily HL, CBC, s/s of bleed F/U s/p cath  Elenor Quinones, PharmD, BCPS Clinical Pharmacist Pager [phone] 04/30/2015 8:00 AM

## 2015-05-01 ENCOUNTER — Encounter (HOSPITAL_COMMUNITY): Payer: Self-pay | Admitting: Cardiovascular Disease

## 2015-05-01 ENCOUNTER — Inpatient Hospital Stay (HOSPITAL_COMMUNITY): Payer: Medicare Other

## 2015-05-01 DIAGNOSIS — I255 Ischemic cardiomyopathy: Secondary | ICD-10-CM | POA: Diagnosis present

## 2015-05-01 LAB — COMPREHENSIVE METABOLIC PANEL
ALBUMIN: 2.5 g/dL — AB (ref 3.5–5.0)
ALK PHOS: 90 U/L (ref 38–126)
ALT: 26 U/L (ref 17–63)
AST: 17 U/L (ref 15–41)
Anion gap: 11 (ref 5–15)
BUN: 8 mg/dL (ref 6–20)
CALCIUM: 8.3 mg/dL — AB (ref 8.9–10.3)
CHLORIDE: 106 mmol/L (ref 101–111)
CO2: 18 mmol/L — AB (ref 22–32)
CREATININE: 0.62 mg/dL (ref 0.61–1.24)
GFR calc Af Amer: >60 mL/min (ref >60)
GFR calc non Af Amer: >60 mL/min (ref >60)
GLUCOSE: 130 mg/dL — AB (ref 65–99)
Potassium: 3.9 mmol/L (ref 3.5–5.1)
SODIUM: 135 mmol/L (ref 135–145)
Total Bilirubin: 1 mg/dL (ref 0.3–1.2)
Total Protein: 6.2 g/dL — ABNORMAL LOW (ref 6.5–8.1)

## 2015-05-01 LAB — CBC
HCT: 37.8 % — ABNORMAL LOW (ref 39.0–52.0)
HEMOGLOBIN: 12.7 g/dL — AB (ref 13.0–17.0)
MCH: 32.2 pg (ref 26.0–34.0)
MCHC: 33.6 g/dL (ref 30.0–36.0)
MCV: 95.7 fL (ref 78.0–100.0)
Platelets: 417 10*3/uL — ABNORMAL HIGH (ref 150–400)
RBC: 3.95 MIL/uL — AB (ref 4.22–5.81)
RDW: 14.6 % (ref 11.5–15.5)
WBC: 22.8 10*3/uL — AB (ref 4.0–10.5)

## 2015-05-01 LAB — PROCALCITONIN: Procalcitonin: 1.24 ng/mL

## 2015-05-01 LAB — MAGNESIUM: Magnesium: 2.1 mg/dL (ref 1.7–2.4)

## 2015-05-01 LAB — VANCOMYCIN, TROUGH: Vancomycin Tr: 14 ug/mL (ref 10.0–20.0)

## 2015-05-01 LAB — HEPARIN LEVEL (UNFRACTIONATED): Heparin Unfractionated: >2.2 IU/mL — ABNORMAL HIGH (ref 0.30–0.70)

## 2015-05-01 MED ORDER — APIXABAN 5 MG PO TABS
5.0000 mg | ORAL_TABLET | Freq: Two times a day (BID) | ORAL | Status: DC
  Administered 2015-05-01 – 2015-05-02 (×3): 5 mg via ORAL
  Filled 2015-05-01 (×3): qty 1

## 2015-05-01 MED ORDER — LISINOPRIL 2.5 MG PO TABS
2.5000 mg | ORAL_TABLET | Freq: Every day | ORAL | Status: DC
  Administered 2015-05-01 – 2015-05-02 (×2): 2.5 mg via ORAL
  Filled 2015-05-01 (×2): qty 1

## 2015-05-01 NOTE — Progress Notes (Signed)
Patient Name: Alexander [name] Date of Encounter: 05/01/2015   Principal Problem:   Cholecystitis Active Problems:   Septic shock (HCC)   NSTEMI (non-ST elevated myocardial infarction) ([place])   Bacteremia   HLD (hyperlipidemia)   CAD (coronary artery disease)   Hypokalemia   Atrial fibrillation with RVR (HCC)   Post-polio syndrome   Chronic pain syndrome   OSA (obstructive sleep apnea)   Essential hypertension   CAD S/P percutaneous coronary angioplasty    SUBJECTIVE  No c/p or sob.  Maintaining sinus rhythm.  CURRENT MEDS . [START ON 05/07/2015] amiodarone  200 mg Oral BID  . amiodarone  400 mg Oral BID  . antiseptic oral rinse  7 mL Mouth Rinse q12n4p  . aspirin  81 mg Oral Daily  . atorvastatin  80 mg Oral q1800  . chlorhexidine  15 mL Mouth Rinse BID  . gabapentin  1,200 mg Oral TID  . lisinopril  2.5 mg Oral Daily  . metoprolol tartrate  12.5 mg Oral BID  . piperacillin-tazobactam (ZOSYN)  IV  3.375 g Intravenous Q8H  . potassium chloride  40 mEq Oral Daily  . vancomycin  1,000 mg Intravenous Q8H    OBJECTIVE  Filed Vitals:   05/01/15 0027 05/01/15 0445 05/01/15 0500 05/01/15 0830  BP: 111/64 115/64  111/60  Pulse: 66 73  68  Temp: 98.4 F (36.9 C) 98.7 F (37.1 C)  98.8 F (37.1 C)  TempSrc: Oral Oral  Oral  Resp: 21 22  22   Height:      Weight:   186 lb 8 oz (84.596 kg)   SpO2: 91% 90%  91%    Intake/Output Summary (Last 24 hours) at 05/01/15 0955 Last data filed at 05/01/15 0730  Gross per 24 hour  Intake 3433.17 ml  Output   6270 ml  Net -2836.83 ml   Filed Weights   04/29/15 0500 04/30/15 0400 05/01/15 0500  Weight: 192 lb 3.9 oz (87.2 kg) 191 lb (86.637 kg) 186 lb 8 oz (84.596 kg)    PHYSICAL EXAM  General: Pleasant, NAD. Neuro: Alert and oriented X 3. Moves all extremities spontaneously. Psych: Normal affect. HEENT:  Normal  Neck: Supple without bruits or JVD. Lungs:  Resp regular and unlabored, diminished breath sounds bilat  bases. Heart: RRR no s3, s4, or murmurs. Abdomen: Soft, non-tender, non-distended, BS + x 4.  Extremities: No clubbing, cyanosis or edema. DP/PT/Radials 2+ and equal bilaterally.  Accessory Clinical Findings  CBC  Recent Labs  04/30/15 0250 05/01/15 0600  WBC 23.9* 22.8*  HGB 11.7* 12.7*  HCT 34.7* 37.8*  MCV 93.8 95.7  PLT 336 [id number]*   Basic Metabolic Panel  Recent Labs  04/30/15 0250 05/01/15 0600  NA 138 135  K 3.3* 3.9  CL 107 106  CO2 23 18*  GLUCOSE 147* 130*  BUN 8 8  CREATININE 0.60* 0.62  CALCIUM 8.2* 8.3*  MG 1.9 2.1   Liver Function Tests  Recent Labs  04/30/15 0250 05/01/15 0600  AST 15 17  ALT 24 26  ALKPHOS 82 90  BILITOT 0.8 1.0  PROT 5.9* 6.2*  ALBUMIN 2.2* 2.5*   TELE  rsr  Radiology/Studies  Dg Chest Port 1 View  05/01/2015  CLINICAL DATA:  DYSPNEA.  HYPERTENSION. EXAM: PORTABLE CHEST 1 VIEW COMPARISON:  04/30/2015 FINDINGS: STIMULATING LEADS ARE SUPERIMPOSED ON THE SPINE. PARTIAL CLEARANCE OF STREAKY LEFT UPPER LOBE OPACITY. THE RIGHT LUNG IS CLEAR. NO LARGE EFFUSIONS. NO PNEUMOTHORAX. HILAR AND MEDIASTINAL CONTOURS ARE  UNREMARKABLE AND UNCHANGED. IMPRESSION: PARTIAL CLEARANCE OF LEFT UPPER LOBE OPACITIES. Electronically Signed   By: Andreas Newport M.D.   On: 05/01/2015 06:55   Dg Chest Port 1 View  04/30/2015  CLINICAL DATA:  Shortness of breath, septic gallbladder EXAM: PORTABLE CHEST 1 VIEW COMPARISON:  04/29/2015 FINDINGS: Cardiomediastinal silhouette is stable. Thoracic and cervical spinal stimulators again noted. Persistent streaky airspace opacifications in left upper lobe. Mild basilar atelectasis no convincing pulmonary edema. IMPRESSION: Persistent streaky airspace opacification in left upper lobe suspicious for infiltrate/pneumonia. No convincing pulmonary edema. Electronically Signed   By: Lahoma Crocker M.D.   On: 04/30/2015 08:17   2D Echocardiogram 06/27/2014  Left ventricle: The cavity size was normal. Systolic function  was   mildly to moderately reduced. The estimated ejection fraction was   in the range of 40% to 45%. Probable moderate hypokinesis of the   anteroseptal, anterior, anterolateral, and apical myocardium.   Left ventricular diastolic function parameters were normal. - Aortic valve: There was mild regurgitation. - Mitral valve: There was mild regurgitation. - Pulmonary arteries: PA peak pressure: 31 mm Hg (S). _____________   ASSESSMENT AND PLAN  1. Acute cholecystitis: s/p percutaneous drain. No plan for surgery currently - to be considered in 6 wks. Abx per IM. Cath yesterday w/o significant obstructive dzs.  No intervention required.  No further ischemic w/u planned.     2. NSTEMI/CAD: Prior h/o stenting in 1995, 2000, and 2005. Trop mildly elevated in setting of #1 along with sepsis/hypotn on admission and then peaked @ 5.21 after going into rapid AF on the morning of 11/26. He did have chest tightness on admission but none since. Echo 11/27 showed EF 40-45% with wma's as outlined above. Cath w/o significant dzs.  Cont bb/acei/statin/asa.   3. PAF: Went into AF on 11/26 @ approx 4:30 AM. Converted with IV Amio after just a few hours.  No further AF.  Amio switched to PO on 11/29.  Cont amio 400 BID x 1 wk (day 2/7) to be followed by amio 200 bid.  Plan to keep on amio throughout perioperative period in a few wks. CHA2DS2VASc = 3. D/C heparin today.  Add eliquis 5mg  BID today.  Will be ok to come off of eliquis 72 hrs prior to GI surgery.  Not yet clear if he will require long term [place].  4. Sepsis/Bacteremia: Hemodynamically stable. Abx per IM.  5. Hypokalemia: Stable this AM.  6. Essential HTN: Stable on bb/acei.  7. HL: Cont statin.  8.  ICM:  Euvolemic.  Cont bb/acei.  Signed, Murray Hodgkins NP   I have seen, examined and evaluated the patient this AM along with Mr. Sharolyn Douglas, NP-C.  After reviewing all the available data and chart,  I agree with his  findings, examination as well as impression recommendations.  Looks better this AM. I personally reviewed angiography yesterday with Dr.Kelly - great news.   No further Afib - converting to PO amiodarone today & agree with Eliquis (actually had long discussion with Pharmacists & colleagues - IV heparin d/c'd)).  Cover Afib with amio & Eliquis until at least post-op chole. Continue BB.  WBC improved & lungs relatively clear.  Should be OK for d/c from Cardiology perspective by tomorrow, provided he tolerates PO amio & Eliquis.    Leonie Man, M.D., M.S. Interventional Cardiologist   Pager # [phone]

## 2015-05-01 NOTE — Progress Notes (Signed)
ANTIBIOTIC CONSULT NOTE - INITIAL & ANTICOAG Follow up  Pharmacy Consult for Zosyn and Vancomycin Indication: HCAP / E. Coli / Kleb bacteremia  No Known Allergies  Patient Measurements: Height: 5\' 11"  (180.3 cm) Weight: 186 lb 8 oz (84.596 kg) IBW/kg (Calculated) : 75.3  Vital Signs: Temp: 98.2 F (36.8 C) (11/30 1956) Temp Source: Oral (11/30 1956) BP: 117/70 mmHg (11/30 1956) Pulse Rate: 66 (11/30 1956) Intake/Output from previous day: 11/29 0701 - 11/30 0700 In: 3433.2 [P.O.:1200; I.V.:1733.2; IV Piggyback:500] Out: [id number] [Urine:5950; Drains:120]  Labs:  Recent Labs  04/29/15 0458 04/30/15 0250 05/01/15 0600  WBC 28.0* 23.9* 22.8*  HGB 12.9* 11.7* 12.7*  PLT 246 336 417*  CREATININE 0.52* 0.60* 0.62     Microbiology: Cx data: 11/24   BCx2: 2/2 EColi, K.oxytoca   E.coli: Amp-R, Unasyn-I, sensitive to all others   K.oxytoca: Amp-R, sensitive all others  11/28 Urine cx > ngF  11/28 Blood cx > ngtd   Anti- infective's  Vanco11/24 >> 11/24 11/28 >>  Zosyn 11/24 >> 11/27 11/28 >>  Fluconazole 11/24 >> 11/24  Ceftriaxone 11/27 >> 11/28  Assessment: 64 yo M presents on 11/24 with sepsis. Found to have acute cholecystitis with E. Coli and Kleb bacteremia. 11/24 Blood cx showed E. Coli and Kleb. Increase leukocytosis and abx broadened to Zosyn and vancomycin on 11/28 for potential HCAP.  VT: 14 on 1000 mg q8H and SCr 0.62   Goal of Therapy:  Vancomycin trough level 15-20 mcg/ml  Resolution of infection  Plan:  1. VT of 14 is slightly below desired range of 15-20 but expect some accumulation across continued dosing interval so will continue current dose of 1000 mg Q8H 2. Zosyn 3.375 gm IV q8h (4 hour infusion) 3. F/U C&S, abx deescalation / LOT   Vincenza Hews, PharmD, BCPS 05/01/2015, 8:35 PM Pager: [phone]

## 2015-05-01 NOTE — Progress Notes (Signed)
PATIENT DETAILS Name: Alexander [name] Age: 64 y.o. Sex: male Date of Birth: 03-May-1951 Admit Date: 04/25/2015 Admitting Physician Raylene Miyamoto, MD [id number] [name], MD  Brief narrative:  64 year old male with history of poliomyelitis-CAD status post remote PCI-admitted with acute cholecystitis and septic shock. Admitted to the intensive care unit, started on pressors and broad-spectrum antibiotics. Seen by general surgery, subsequently had percutaneous cholecystostomy drain placed. Once improved, transferred to the hospitalist service on 11/27. Hospital course has been complicated by development of atrial fibrillation with RVR and non-STEMI.  While in hospital he was seen by general surgery, they recommended cholecystectomy drain which was placed by IR on 04/25/2015, he was also seen by cardiology. In the meantime on 04/29/2015 he developed left upper lobe HCAP.  He has underwent left heart catheterization on 04/30/2015 which was unremarkable with nonocclusive CAD.   Subjective:  Patient in bed, denies any headache, no chest pain. Currently no shortness of breath. Does have some right upper quadrant discomfort which is improving as well. Minimal dry cough at this time.  Assessment/Plan:  Septic shock due to acute acalculous cholecystitis with Escherichia coli and Klebsiella bacteremia.:  Required pressors on admission. With antibiotics and supportive care, sepsis pathophysiology has now resolved. Antibiotics changed to vancomycin and Zosyn on 04/29/2015 due to HCAP, we'll likely give antibiotics for bacteremia for a total of 14 days. Clinically improving now continue present antibiotics for another few days.  General surgery on board, he has cholecystectomy drain placed by IR on 04/25/2015 which will be continued. Further management per general surgery likely will have drain for 4-6 weeks. So far repeat blood cultures have stayed negative.   Leukocytosis on  04/29/2015. Due to HCAP LUL, had switched antibiotics to vancomycin and Zosyn to cover for HCAP on 04/29/2015. Added flutter valve, aspiration precautions. Stable lactic acid and pro-calcitonin, negative Repeat blood cultures. Clinically improving on present antibiotics continue for another 24-48 hours then switch to oral therapy based on cultures extremity for a total of 14 days.   A. fib RVR: Cardiology consulted,started on amiodarone and oral beta blocker for rate control, and on heparin infusion CHADS2Vasc score of 3, request cardiology to consider Eliquis. TSH stable, Echogram noted. Cardiology following.  Non-STEMI: Has underlying CAD-suspect that A. fib RVR/hypotension probably precipitated non-STEMI. Continue aspirin, statin, IV heparin. [facility] onboard underwent left heart catheterization on 04/30/2015 which showed nonocclusive CAD, medical management per cardiology at this time. Continue beta blocker, statin and aspirin for secondary prevention.   Chronic systolic heart failure with EF 45% on echogram. Currently compensated. On beta blocker, will add low-dose ACE inhibitor.   Essential hypertension: Blood pressure controlled without antihypertensives-resume lisinopril when able  GERD: Continue PPI  History of poliomyelitis with postpolio syndrome: Significant muscle atrophy in bilateral lower extremities. Continue Neurontin. PT eval when more stable. Normally ambulates with braces and walker as needed.  Hypokalemia: Replaced Will monitor.  Chronic pain syndrome/chronic headaches: Currently stable-takes Excedrin Migraine (up to 12 times daily!!).     Disposition: Remain inpatient  Antimicrobial agents  See below  Anti-infectives    Start     Dose/Rate Route Frequency Ordered Stop   04/29/15 2000  vancomycin (VANCOCIN) IVPB 1000 mg/200 mL premix     1,000 mg 200 mL/hr over 60 Minutes Intravenous Every 8 hours 04/29/15 1033     04/29/15 1400  piperacillin-tazobactam (ZOSYN)  IVPB 3.375 g     3.375 g 12.5  mL/hr over 240 Minutes Intravenous Every 8 hours 04/29/15 1133     04/29/15 1100  vancomycin (VANCOCIN) 1,750 mg in sodium chloride 0.9 % 500 mL IVPB     1,750 mg 250 mL/hr over 120 Minutes Intravenous  Once 04/29/15 1015 04/29/15 1323   04/29/15 1030  piperacillin-tazobactam (ZOSYN) IVPB 3.375 g  Status:  Discontinued     3.375 g 12.5 mL/hr over 240 Minutes Intravenous Every 8 hours 04/29/15 1015 04/29/15 1133   04/28/15 1330  cefTRIAXone (ROCEPHIN) 2 g in dextrose 5 % 50 mL IVPB  Status:  Discontinued     2 g 100 mL/hr over 30 Minutes Intravenous Every 24 hours 04/28/15 1252 04/29/15 1006   04/25/15 1000  piperacillin-tazobactam (ZOSYN) IVPB 3.375 g  Status:  Discontinued     3.375 g 12.5 mL/hr over 240 Minutes Intravenous 3 times per day 04/25/15 0121 04/28/15 1252   04/25/15 0900  fluconazole (DIFLUCAN) IVPB 400 mg  Status:  Discontinued     400 mg 100 mL/hr over 120 Minutes Intravenous Every 24 hours 04/25/15 0821 04/25/15 1333   04/25/15 0800  vancomycin (VANCOCIN) IVPB 1000 mg/200 mL premix  Status:  Discontinued     1,000 mg 200 mL/hr over 60 Minutes Intravenous Every 8 hours 04/25/15 0720 04/25/15 1333   04/25/15 0100  piperacillin-tazobactam (ZOSYN) IVPB 3.375 g     3.375 g 100 mL/hr over 30 Minutes Intravenous  Once 04/25/15 0056 04/25/15 0308      DVT Prophylaxis: IV Heparin gtt  Code Status: Full code   Family Communication Wife at bedside  Procedures: 11/24>>biliary perc drain  TTE  Left ventricle: The cavity size was normal. Systolic function wasmildly to moderately reduced. The estimated ejection fraction was in the range of 40% to 45%. Probable moderate hypokinesis of theanteroseptal, anterior, anterolateral, and apical myocardium. Left ventricular diastolic function parameters were normal. - Aortic valve: There was mild regurgitation. - Mitral valve: There was mild regurgitation. - Pulmonary arteries: PA peak  pressure: 31 mm Hg (S).   L Heart Cath 04-30-15 - Dr Ellyn Hack   Prox RCA-1 lesion, 20% stenosed.  Ost LAD to Prox LAD lesion, 20% stenosed.  There is mild left ventricular systolic dysfunction.  Mild LV dysfunction with very subtle minimal mid anterolateral hypocontractility with a global ejection fraction of 45-50%.  No significant residual CAD with 10-20% luminal irregularity of the proximal LAD, and evidence for widely patent LAD stent after this region of irregularity; dominant left circumflex coronary artery with a widely patent stent in the marginal branch; nondominant RCA with widely pain proximal stent with smooth 20% narrowing immediately proximal to the stented segment.  Recommendations: The present study did not demonstrate significant residual CAD, and all stents are widely patent. Heparin will be resumed 8 hours post sheath removal in light of his atrial fibrillation and troponin elevation of 5.   CONSULTS:  cardiology, pulmonary/intensive care and general surgery  Time spent 30 minutes-Greater than 50% of this time was spent in counseling, explanation of diagnosis, planning of further management, and coordination of care.  MEDICATIONS: Scheduled Meds: . [START ON 05/07/2015] amiodarone  200 mg Oral BID  . amiodarone  400 mg Oral BID  . antiseptic oral rinse  7 mL Mouth Rinse q12n4p  . aspirin  81 mg Oral Daily  . atorvastatin  80 mg Oral q1800  . chlorhexidine  15 mL Mouth Rinse BID  . gabapentin  1,200 mg Oral TID  . metoprolol tartrate  12.5 mg Oral BID  .  piperacillin-tazobactam (ZOSYN)  IV  3.375 g Intravenous Q8H  . potassium chloride  40 mEq Oral Daily  . vancomycin  1,000 mg Intravenous Q8H   Continuous Infusions: . heparin 2,300 Units/hr (04/30/15 2349)   PRN Meds:.sodium chloride, acetaminophen, fentaNYL (SUBLIMAZE) injection, methocarbamol, ondansetron (ZOFRAN) IV    PHYSICAL EXAM: Vital signs in last 24 hours: Filed Vitals:   05/01/15 0027  05/01/15 0445 05/01/15 0500 05/01/15 0830  BP: 111/64 115/64  111/60  Pulse: 66 73  68  Temp: 98.4 F (36.9 C) 98.7 F (37.1 C)  98.8 F (37.1 C)  TempSrc: Oral Oral  Oral  Resp: 21 22  22   Height:      Weight:   84.596 kg (186 lb 8 oz)   SpO2: 91% 90%  91%    Weight change: -2.041 kg (-4 lb 8 oz) Filed Weights   04/29/15 0500 04/30/15 0400 05/01/15 0500  Weight: 87.2 kg (192 lb 3.9 oz) 86.637 kg (191 lb) 84.596 kg (186 lb 8 oz)   Body mass index is 26.02 kg/(m^2).   Gen Exam: Awake and alert with clear speech.  Neck: Supple, No JVD.   Chest: B/L Clear.   CVS: S1 S2 Regular, no murmurs.  Abdomen: soft, BS +, non tender, non distended. Gallbladder drain in place Extremities: no edema, lower extremities warm to touch. Neurologic: Non Focal.  Chronic lower extremity weakness due to polio Skin: No Rash.   Wounds: N/A.    Intake/Output from previous day:  Intake/Output Summary (Last 24 hours) at 05/01/15 0856 Last data filed at 05/01/15 0730  Gross per 24 hour  Intake 3433.17 ml  Output   6270 ml  Net -2836.83 ml     LAB RESULTS: CBC  Recent Labs Lab 04/25/15 0045  04/26/15 0414 04/28/15 0053 04/29/15 0458 04/30/15 0250 05/01/15 0600  WBC 4.2  < > 18.9* 15.9* 28.0* 23.9* 22.8*  HGB 13.0  < > 10.9* 12.2* 12.9* 11.7* 12.7*  HCT 37.5*  < > 32.0* 35.2* 36.5* 34.7* 37.8*  PLT 165  < > 145* 194 246 336 417*  MCV 93.8  < > 93.8 92.6 92.9 93.8 95.7  MCH 32.5  < > 32.0 32.1 32.8 31.6 32.2  MCHC 34.7  < > 34.1 34.7 35.3 33.7 33.6  RDW 13.2  < > 14.0 13.9 14.0 14.1 14.6  LYMPHSABS 0.3*  --   --   --   --   --   --   MONOABS 0.1  --   --   --   --   --   --   EOSABS 0.0  --   --   --   --   --   --   BASOSABS 0.0  --   --   --   --   --   --   < > = values in this interval not displayed.  Chemistries   Recent Labs Lab 04/26/15 0414  04/27/15 1632 04/28/15 0053 04/29/15 0458 04/30/15 0250 05/01/15 0600  NA 138  < > 136 137 137 138 135  K 3.0*  < > 2.9* 3.2*  3.5 3.3* 3.9  CL 111  < > 106 106 106 107 106  CO2 21*  < > 22 24 22 23  18*  GLUCOSE 112*  < > 150* 104* 117* 147* 130*  BUN 16  < > 14 9 8 8 8   CREATININE 0.70  < > 0.60* 0.52* 0.52* 0.60* 0.62  CALCIUM 7.9*  < >  8.0* 7.9* 8.3* 8.2* 8.3*  MG 2.5*  --   --  1.9  --  1.9 2.1  < > = values in this interval not displayed.  CBG:  Recent Labs Lab 04/25/15 1019  GLUCAP 134*    GFR Estimated Creatinine Clearance: 98 mL/min (by C-G formula based on Cr of 0.62).  Coagulation profile  Recent Labs Lab 04/25/15 1051  INR 1.36    Cardiac Enzymes  Recent Labs Lab 04/27/15 0450 04/27/15 1041 04/27/15 1632  TROPONINI 5.21* 3.86* 2.98*    Invalid input(s): POCBNP No results for input(s): DDIMER in the last 72 hours. No results for input(s): HGBA1C in the last 72 hours. No results for input(s): CHOL, HDL, LDLCALC, TRIG, CHOLHDL, LDLDIRECT in the last 72 hours. No results for input(s): TSH, T4TOTAL, T3FREE, THYROIDAB in the last 72 hours.  Invalid input(s): FREET3 No results for input(s): VITAMINB12, FOLATE, FERRITIN, TIBC, IRON, RETICCTPCT in the last 72 hours. No results for input(s): LIPASE, AMYLASE in the last 72 hours.  Urine Studies No results for input(s): UHGB, CRYS in the last 72 hours.  Invalid input(s): UACOL, UAPR, USPG, UPH, UTP, UGL, UKET, UBIL, UNIT, UROB, ULEU, UEPI, UWBC, URBC, UBAC, CAST, UCOM, BILUA  MICROBIOLOGY: Recent Results (from the past 240 hour(s))  Culture, blood (routine x 2)     Status: None   Collection Time: 04/25/15 12:45 AM  Result Value Ref Range Status   Specimen Description BLOOD LEFT ARM  Final   Special Requests BOTTLES DRAWN AEROBIC AND ANAEROBIC 5ML  Final   Culture  Setup Time   Final    GRAM NEGATIVE RODS IN BOTH AEROBIC AND ANAEROBIC BOTTLES CRITICAL RESULT CALLED TO, READ BACK BY AND VERIFIED WITH: L FLOOD 04/25/15 @ [place] OXYTOCA   Final   Report Status 04/28/2015 FINAL   Final   Organism ID, Bacteria ESCHERICHIA COLI  Final   Organism ID, Bacteria KLEBSIELLA OXYTOCA  Final      Susceptibility   Escherichia coli - MIC*    AMPICILLIN >=32 RESISTANT Resistant     CEFAZOLIN <=4 SENSITIVE Sensitive     CEFEPIME <=1 SENSITIVE Sensitive     CEFTAZIDIME <=1 SENSITIVE Sensitive     CEFTRIAXONE <=1 SENSITIVE Sensitive     CIPROFLOXACIN <=0.25 SENSITIVE Sensitive     GENTAMICIN <=1 SENSITIVE Sensitive     IMIPENEM <=0.25 SENSITIVE Sensitive     TRIMETH/SULFA <=20 SENSITIVE Sensitive     AMPICILLIN/SULBACTAM 16 INTERMEDIATE Intermediate     PIP/TAZO <=4 SENSITIVE Sensitive     * ESCHERICHIA COLI   Klebsiella oxytoca - MIC*    AMPICILLIN >=32 RESISTANT Resistant     CEFAZOLIN 8 SENSITIVE Sensitive     CEFEPIME <=1 SENSITIVE Sensitive     CEFTAZIDIME <=1 SENSITIVE Sensitive     CEFTRIAXONE <=1 SENSITIVE Sensitive     CIPROFLOXACIN <=0.25 SENSITIVE Sensitive     GENTAMICIN <=1 SENSITIVE Sensitive     IMIPENEM 0.5 SENSITIVE Sensitive     TRIMETH/SULFA <=20 SENSITIVE Sensitive     AMPICILLIN/SULBACTAM 8 SENSITIVE Sensitive     PIP/TAZO <=4 SENSITIVE Sensitive     * KLEBSIELLA OXYTOCA  Culture, blood (routine x 2)     Status: None   Collection Time: 04/25/15 12:53 AM  Result Value Ref Range Status   Specimen Description BLOOD RIGHT ARM  Final   Special Requests BOTTLES DRAWN AEROBIC AND ANAEROBIC 5CC  Final   Culture  Setup Time  Final    GRAM NEGATIVE RODS IN BOTH AEROBIC AND ANAEROBIC BOTTLES CRITICAL RESULT CALLED TO, READ BACK BY AND VERIFIED WITH: L FLOOD 04/25/15 @ 38 M VESTAL    Culture   Final    ESCHERICHIA COLI SUSCEPTIBILITIES PERFORMED ON PREVIOUS CULTURE WITHIN THE LAST 5 DAYS.    Report Status 04/29/2015 FINAL  Final  Urine culture     Status: None   Collection Time: 04/25/15  1:03 AM  Result Value Ref Range Status   Specimen Description URINE, CLEAN CATCH  Final   Special Requests NONE  Final   Culture 4,000 COLONIES/mL INSIGNIFICANT  GROWTH  Final   Report Status 04/26/2015 FINAL  Final  MRSA PCR Screening     Status: None   Collection Time: 04/25/15 10:36 AM  Result Value Ref Range Status   MRSA by PCR NEGATIVE NEGATIVE Final    Comment:        The GeneXpert MRSA Assay (FDA approved for NASAL specimens only), is one component of a comprehensive MRSA colonization surveillance program. It is not intended to diagnose MRSA infection nor to guide or monitor treatment for MRSA infections.   Culture, body fluid-bottle     Status: None   Collection Time: 04/25/15 12:21 PM  Result Value Ref Range Status   Specimen Description BILE  Final   Special Requests NONE  Final   Gram Stain MULTIPLE SPECIES PRESENT  Final   Culture MULTIPLE ORGANISMS PRESENT, NONE PREDOMINANT  Final   Report Status 04/27/2015 FINAL  Final  Gram stain     Status: None   Collection Time: 04/25/15 12:21 PM  Result Value Ref Range Status   Specimen Description BILE  Final   Special Requests NONE  Final   Gram Stain   Final    DEGENERATED CELLULAR MATERIAL PRESENT ABUNDANT GRAM POSITIVE AND GRAM NEGATIVE BACTERIA PRESENT CONSISTENT WITH LOWER GI FLORA RARE WBC PRESENT,BOTH PMN AND MONONUCLEAR NO YEAST OR FUNGAL ELEMENTS SEEN    Report Status 04/25/2015 FINAL  Final  Urine culture     Status: None   Collection Time: 04/29/15  8:44 AM  Result Value Ref Range Status   Specimen Description URINE, RANDOM  Final   Special Requests NONE  Final   Culture NO GROWTH 1 DAY  Final   Report Status 04/30/2015 FINAL  Final  Culture, blood (routine x 2)     Status: None (Preliminary result)   Collection Time: 04/29/15 10:45 AM  Result Value Ref Range Status   Specimen Description BLOOD LEFT HAND  Final   Special Requests BOTTLES DRAWN AEROBIC AND ANAEROBIC  10CC  Final   Culture NO GROWTH 1 DAY  Final   Report Status PENDING  Incomplete  Culture, blood (routine x 2)     Status: None (Preliminary result)   Collection Time: 04/29/15 10:55 AM  Result  Value Ref Range Status   Specimen Description BLOOD RIGHT HAND  Final   Special Requests BOTTLES DRAWN AEROBIC AND ANAEROBIC  10CC  Final   Culture NO GROWTH 1 DAY  Final   Report Status PENDING  Incomplete    RADIOLOGY STUDIES/RESULTS: Dg Chest 2 View  04/25/2015  CLINICAL DATA:  Initial evaluation for possible sepsis. EXAM: CHEST  2 VIEW COMPARISON:  None. FINDINGS: Examination is somewhat limited by technique and lordotic angulation. Cardiac and mediastinal silhouettes grossly within normal limits. Spinal stimulator electrodes noted. Lungs are hypoinflated. Mild right basilar atelectasis. There is diffuse peribronchial thickening with mild prominence of the interstitial  markings, greatest within the left mid and lower lung. Finding is nonspecific, but may reflect sequela of bronchiolitis or possible atypical/interstitial pneumonia. No consolidative airspace opacity. No pulmonary edema or pleural effusion. No pneumothorax. No acute osseus abnormality. IMPRESSION: Diffuse peribronchial thickening with prominence of the interstitial markings, greatest within the mid and lower left lung. Finding is nonspecific but may reflect sequela of bronchiolitis or possible atypical/ interstitial pneumonia. No consolidative airspace disease. Electronically Signed   By: Jeannine Boga M.D.   On: 04/25/2015 01:40   Ct Abdomen Pelvis W Contrast  04/25/2015  CLINICAL DATA:  Low abdominal pain and low chest pain since yesterday. Constipation on Tuesday and Wednesday. Fever. EXAM: CT ABDOMEN AND PELVIS WITH CONTRAST TECHNIQUE: Multidetector CT imaging of the abdomen and pelvis was performed using the standard protocol following bolus administration of intravenous contrast. CONTRAST:  164mL OMNIPAQUE IOHEXOL 300 MG/ML  SOLN COMPARISON:  None. FINDINGS: Interstitial fibrosis in the lung bases with ground-glass alveolitis particularly on the right. Calcified granulomas in the lung bases. Coronary artery  calcifications. The gallbladder is distended. No stones are identified but there is evidence of wall thickening and edema with mild inflammatory reaction around the gallbladder. Mild intra and extrahepatic bile duct dilatation. No discrete stone or mass identified. There is infiltration around the distal aspect of the stomach and duodenal bulb region and around the head of the pancreas. This is nonspecific and could represent inflammatory change due to peptic ulcer disease, cholecystitis, or focal pancreatitis. The spleen, inferior vena cava, and retroperitoneal lymph nodes are unremarkable. Calcification of the aorta without aneurysm. Subcentimeter cysts in the right kidney. No hydronephrosis in the kidneys. Nodule in the left adrenal gland measures 18 mm diameter. Hounsfield unit measurements are indeterminate but statistically this most likely represents benign adenoma. Consider followup in 1 year with noncontrast CT. Stomach, small bowel, and colon are mostly decompressed. No free air or free fluid in the abdomen. Pelvis: Prostate gland is mildly enlarged. Bladder wall is not thickened. No free or loculated pelvic fluid collections. No pelvic mass or lymphadenopathy. Rectosigmoid colon is unremarkable. Appendix is not identified. Degenerative changes in the spine. Spondylolysis with mild spondylolisthesis at L5-S1. No destructive bone lesions. Spinal stimulator device. IMPRESSION: Inflammatory process in the right upper quadrant with distended thick-walled gallbladder, bile duct dilatation, and infiltration in the adjacent fat. Etiology is nonspecific in could indicate cholecystitis, focal pancreatitis, or peptic ulcer disease. No evidence of bowel perforation or obstruction. Interstitial fibrosis and inflammatory changes in the lung bases. Electronically Signed   By: Lucienne Capers M.D.   On: 04/25/2015 02:44   Ir Perc Cholecystostomy  04/25/2015  INDICATION: Acute cholecystitis. Patient is septic, with  pharmacologic pressure support. EXAM: ULTRASOUND AND FLUOROSCOPIC-GUIDED CHOLECYSTOSTOMY TUBE PLACEMENT COMPARISON:  CT 04/25/2015 MEDICATIONS: Fentanyl 5.0 mcg IV; Versed 25 mg IV; The patient is currently admitted to the hospital and on intravenous antibiotics. Antibiotics were administered within an appropriate time frame prior to skin puncture. ANESTHESIA/SEDATION: Total Moderate Sedation Time Twenty minutes CONTRAST:  8mL OMNIPAQUE IOHEXOL 300 MG/ML  SOLN FLUOROSCOPY TIME:  2 minutes 6 seconds COMPLICATIONS: None PROCEDURE: Informed written consent was obtained from the patient after a discussion of the risks, benefits and alternatives to treatment. Questions regarding the procedure were encouraged and answered. A timeout was performed prior to the initiation of the procedure. The right upper abdominal quadrant was prepped and draped in the usual sterile fashion, and a sterile drape was applied covering the operative field. Maximum barrier sterile technique with  sterile gowns and gloves were used for the procedure. A timeout was performed prior to the initiation of the procedure. Local anesthesia was provided with 1% lidocaine with epinephrine. Ultrasound scanning of the right upper quadrant demonstrates a markedly dilated gallbladder. Of note, the patient reported pain with ultrasound imaging over the gallbladder. Utilizing a transhepatic approach, a 22 gauge needle was advanced into the gallbladder under direct ultrasound guidance. An ultrasound image was saved for documentation purposes. Appropriate intraluminal puncture was confirmed with the efflux of bile and advancement of an 0.018 wire into the gallbladder lumen. The needle was exchanged for an [place] set. A small amount of contrast was injected to confirm appropriate intraluminal positioning. Over a [place] wire, a 37.2-[place] cholecystomy tube was advanced into the gallbladder fossa, coiled and locked. Bile was aspirated and a small amount of  contrast was injected as several post procedural spot radiographic images were obtained in various obliquities. The catheter was secured to the skin with suture, connected to a drainage bag and a dressing was placed. The patient tolerated the procedure well without immediate post procedural complication. IMPRESSION: Status post image guided percutaneous cholecystostomy tube. Sample sent for analysis. Signed, Dulcy Fanny. Earleen Newport, DO [facility] Electronically Signed   By: Corrie Mckusick D.O.   On: 04/25/2015 12:46   Dg Chest Port 1 View  05/01/2015  CLINICAL DATA:  DYSPNEA.  HYPERTENSION. EXAM: PORTABLE CHEST 1 VIEW COMPARISON:  04/30/2015 FINDINGS: STIMULATING LEADS ARE SUPERIMPOSED ON THE SPINE. PARTIAL CLEARANCE OF STREAKY LEFT UPPER LOBE OPACITY. THE RIGHT LUNG IS CLEAR. NO LARGE EFFUSIONS. NO PNEUMOTHORAX. HILAR AND MEDIASTINAL CONTOURS ARE UNREMARKABLE AND UNCHANGED. IMPRESSION: PARTIAL CLEARANCE OF LEFT UPPER LOBE OPACITIES. Electronically Signed   By: Andreas Newport M.D.   On: 05/01/2015 06:55   Dg Chest Port 1 View  04/30/2015  CLINICAL DATA:  Shortness of breath, septic gallbladder EXAM: PORTABLE CHEST 1 VIEW COMPARISON:  04/29/2015 FINDINGS: Cardiomediastinal silhouette is stable. Thoracic and cervical spinal stimulators again noted. Persistent streaky airspace opacifications in left upper lobe. Mild basilar atelectasis no convincing pulmonary edema. IMPRESSION: Persistent streaky airspace opacification in left upper lobe suspicious for infiltrate/pneumonia. No convincing pulmonary edema. Electronically Signed   By: Lahoma Crocker M.D.   On: 04/30/2015 08:17   Dg Chest Port 1 View  04/29/2015  CLINICAL DATA:  Shortness of breath and cough today. EXAM: PORTABLE CHEST 1 VIEW COMPARISON:  04/25/2015. FINDINGS: Trachea is midline. Heart size within normal limits. Right IJ central line has been removed. Mild patchy airspace opacification is seen  bilaterally, worst in the left upper lobe. Diagnostic quality is somewhat limited by apical lordotic view. No definite pleural fluid. IMPRESSION: Mild patchy bilateral airspace opacification, worst in the left upper lobe. Findings may be due to edema or pneumonia. Electronically Signed   By: Lorin Picket M.D.   On: 04/29/2015 08:05   Dg Chest Portable 1 View  04/25/2015  CLINICAL DATA:  Central line placement.  Initial encounter. EXAM: PORTABLE CHEST 1 VIEW COMPARISON:  Chest radiograph performed earlier today at 1:09 a.m. FINDINGS: The right IJ line is noted ending about the mid SVC. The lungs are hypoexpanded. Vascular crowding and vascular congestion are noted. Patchy bilateral opacities may reflect atelectasis or possibly mild pneumonia. No definite pleural effusion or pneumothorax is seen. The cardiomediastinal silhouette is normal in size. No acute osseous abnormalities are seen. Spinal stimulation leads are partially imaged. IMPRESSION: 1. Right IJ line noted ending about the mid SVC.  2. Lungs hypoexpanded. Vascular congestion noted. Patchy bilateral airspace opacities may reflect atelectasis or possibly mild pneumonia, more prominent than on the prior study. Electronically Signed   By: Garald Balding M.D.   On: 04/25/2015 07:11    Thurnell Lose, MD  [facility] Pager:[phone]  If 7PM-7AM, please contact night-coverage [email or web address] Password [email or web address] 05/01/2015, 8:56 AM   LOS: 6 days

## 2015-05-01 NOTE — Plan of Care (Signed)
Problem: Cardiac: Goal: Ability to achieve and maintain adequate cardiopulmonary perfusion will improve Outcome: Progressing Patient has converted to sinus rhythm with 1st degree AV block on telemetry post-cardiac catheterization. Right radial catheterization site is level 0. Radial Allen's test passed at time of TR band removal and dressing applied per protocol.  Recent filed vital signs: Filed Vitals:    04/30/15 2100 04/30/15 2200 04/30/15 2300 05/01/15 0027  BP: 113/61 113/64 113/67 111/64  Pulse: 69 73 67 66  Temp:       98.4 F (36.9 C)  TempSrc:       Oral  Resp: 15 20 24 21   Height:          Weight:          SpO2: 91% 95% 92% 91%    Discussed plan of care, medications and treatments. Patient educated re:     Basic plan of care  Post cardiac-catheterization radial site care and management  Signs/symptoms of common site problems  Signs/symptoms of site infection  Signs/symptoms of distal thrombus  What to do/who to call  Activity progression and limb restrictions  Current medications for atrial fibrillation  Amiodarone (Pacerone)  Metoprolol tartrate  Current medications for infection management  Vancomycin (Vancocin) IV  Piperacillin-tazobactam (Zosyn) IV  Current anticoagulant medication (to resume post-cath)  Heparin IV  General activity progression  Safety goals  Patient declined to get out of bed to chair this evening, otherwise, appears to be progressing well. Patient has limited knowledge of atrial arrythmia and sepsis as related to his cholecystitis. He will need ongoing education and reinforcement leading up to discharge to ensure safety and compliance with therapy at home. Case management consult initiated per protocol.

## 2015-05-02 ENCOUNTER — Telehealth: Payer: Self-pay | Admitting: Physician Assistant

## 2015-05-02 DIAGNOSIS — I248 Other forms of acute ischemic heart disease: Secondary | ICD-10-CM | POA: Insufficient documentation

## 2015-05-02 LAB — CBC
HCT: 38.1 % — ABNORMAL LOW (ref 39.0–52.0)
HEMOGLOBIN: 13 g/dL (ref 13.0–17.0)
MCH: 32.7 pg (ref 26.0–34.0)
MCHC: 34.1 g/dL (ref 30.0–36.0)
MCV: 95.7 fL (ref 78.0–100.0)
Platelets: 479 10*3/uL — ABNORMAL HIGH (ref 150–400)
RBC: 3.98 MIL/uL — ABNORMAL LOW (ref 4.22–5.81)
RDW: 14.3 % (ref 11.5–15.5)
WBC: 20.6 10*3/uL — ABNORMAL HIGH (ref 4.0–10.5)

## 2015-05-02 MED ORDER — AMIODARONE HCL 200 MG PO TABS: 200.0000 mg | ORAL_TABLET | Freq: Two times a day (BID) | ORAL | Status: DC

## 2015-05-02 MED ORDER — K PHOS MONO-SOD PHOS DI & MONO 155-852-130 MG PO TABS
500.0000 mg | ORAL_TABLET | Freq: Once | ORAL | Status: AC
  Administered 2015-05-02: 500 mg via ORAL
  Filled 2015-05-02: qty 2

## 2015-05-02 MED ORDER — ATORVASTATIN CALCIUM 80 MG PO TABS: 80.0000 mg | ORAL_TABLET | Freq: Every day | ORAL | Status: DC

## 2015-05-02 MED ORDER — LISINOPRIL 2.5 MG PO TABS: 2.5000 mg | ORAL_TABLET | Freq: Every day | ORAL | Status: DC

## 2015-05-02 MED ORDER — LEVOFLOXACIN 750 MG PO TABS: 750.0000 mg | ORAL_TABLET | Freq: Every day | ORAL | Status: DC

## 2015-05-02 MED ORDER — APIXABAN 5 MG PO TABS: 5.0000 mg | ORAL_TABLET | Freq: Two times a day (BID) | ORAL | Status: DC

## 2015-05-02 MED ORDER — METOPROLOL TARTRATE 25 MG PO TABS: 12.5000 mg | ORAL_TABLET | Freq: Two times a day (BID) | ORAL | Status: DC

## 2015-05-02 MED ORDER — LEVOFLOXACIN 750 MG PO TABS
750.0000 mg | ORAL_TABLET | Freq: Every day | ORAL | Status: DC
  Administered 2015-05-02: 750 mg via ORAL
  Filled 2015-05-02: qty 1

## 2015-05-02 MED ORDER — ASPIRIN 81 MG PO CHEW: 81.0000 mg | CHEWABLE_TABLET | Freq: Every day | ORAL | Status: DC

## 2015-05-02 NOTE — Progress Notes (Signed)
Patient Name: Alexander [name] Date of Encounter: 05/02/2015  Primary Cardiologist: new   Principal Problem:   Cholecystitis Active Problems:   Post-polio syndrome   Chronic pain syndrome   OSA (obstructive sleep apnea)   HLD (hyperlipidemia)   CAD (coronary artery disease)   Septic shock (HCC)   Hypokalemia   Essential hypertension   NSTEMI (non-ST elevated myocardial infarction) ([place])   Atrial fibrillation with RVR ([place])   Bacteremia   CAD S/P percutaneous coronary angioplasty   Ischemic cardiomyopathy    SUBJECTIVE  Denies any CP or SOB. Feeling good.   CURRENT MEDS . [START ON 05/07/2015] amiodarone  200 mg Oral BID  . amiodarone  400 mg Oral BID  . antiseptic oral rinse  7 mL Mouth Rinse q12n4p  . apixaban  5 mg Oral BID  . aspirin  81 mg Oral Daily  . atorvastatin  80 mg Oral q1800  . chlorhexidine  15 mL Mouth Rinse BID  . gabapentin  1,200 mg Oral TID  . lisinopril  2.5 mg Oral Daily  . metoprolol tartrate  12.5 mg Oral BID  . piperacillin-tazobactam (ZOSYN)  IV  3.375 g Intravenous Q8H  . potassium chloride  40 mEq Oral Daily  . vancomycin  1,000 mg Intravenous Q8H    OBJECTIVE  Filed Vitals:   05/01/15 1956 05/02/15 0000 05/02/15 0416 05/02/15 0558  BP: 117/70 97/63 96/65    Pulse: 66 60 71   Temp: 98.2 F (36.8 C) 98.7 F (37.1 C) 99.4 F (37.4 C)   TempSrc: Oral Oral Oral   Resp:  15 16   Height:    5\' 11"  (1.803 m)  Weight:    180 lb 4.8 oz (81.784 kg)  SpO2: 99% 95% 94%     Intake/Output Summary (Last 24 hours) at 05/02/15 0743 Last data filed at 05/02/15 0548  Gross per 24 hour  Intake   1215 ml  Output   1910 ml  Net   -695 ml   Filed Weights   04/30/15 0400 05/01/15 0500 05/02/15 0558  Weight: 191 lb (86.637 kg) 186 lb 8 oz (84.596 kg) 180 lb 4.8 oz (81.784 kg)    PHYSICAL EXAM  General: Pleasant, NAD. Neuro: Alert and oriented X 3. Moves all extremities spontaneously. Psych: Normal affect. HEENT:  Normal  Neck: Supple  without bruits or JVD. Lungs:  Resp regular and unlabored. Decreased breath sound in bibasilar area, no rale, rhonchi or wheezing Heart: RRR no s3, s4, or murmurs. Abdomen: Soft, non-tender, non-distended, BS + x 4. Drain in place. Extremities: No clubbing, cyanosis or edema. DP/PT/Radials 2+ and equal bilaterally.  Accessory Clinical Findings  CBC  Recent Labs  05/01/15 0600 05/02/15 0355  WBC 22.8* 20.6*  HGB 12.7* 13.0  HCT 37.8* 38.1*  MCV 95.7 95.7  PLT 417* 123456*   Basic Metabolic Panel  Recent Labs  04/30/15 0250 05/01/15 0600  NA 138 135  K 3.3* 3.9  CL 107 106  CO2 23 18*  GLUCOSE 147* 130*  BUN 8 8  CREATININE 0.60* 0.62  CALCIUM 8.2* 8.3*  MG 1.9 2.1   Liver Function Tests  Recent Labs  04/30/15 0250 05/01/15 0600  AST 15 17  ALT 24 26  ALKPHOS 82 90  BILITOT 0.8 1.0  PROT 5.9* 6.2*  ALBUMIN 2.2* 2.5*    TELE NSR without recurrence of afib    ECG  No new EKG  Echocardiogram 04/28/2015  LV EF: 40% -  45%  ------------------------------------------------------------------- Indications:  Chest pain 786.51.  ------------------------------------------------------------------- History:  PMH:  Bacteremia. PMH:  Myocardial infarction. Risk factors: Hypertension. Dyslipidemia.  ------------------------------------------------------------------- Study Conclusions  - Left ventricle: The cavity size was normal. Systolic function was mildly to moderately reduced. The estimated ejection fraction was in the range of 40% to 45%. Probable moderate hypokinesis of the anteroseptal, anterior, anterolateral, and apical myocardium. Left ventricular diastolic function parameters were normal. - Aortic valve: There was mild regurgitation. - Mitral valve: There was mild regurgitation. - Pulmonary arteries: PA peak pressure: 31 mm Hg (S).    Radiology/Studies  Dg Chest 2 View  04/25/2015  CLINICAL DATA:  Initial evaluation for  possible sepsis. EXAM: CHEST  2 VIEW COMPARISON:  None. FINDINGS: Examination is somewhat limited by technique and lordotic angulation. Cardiac and mediastinal silhouettes grossly within normal limits. Spinal stimulator electrodes noted. Lungs are hypoinflated. Mild right basilar atelectasis. There is diffuse peribronchial thickening with mild prominence of the interstitial markings, greatest within the left mid and lower lung. Finding is nonspecific, but may reflect sequela of bronchiolitis or possible atypical/interstitial pneumonia. No consolidative airspace opacity. No pulmonary edema or pleural effusion. No pneumothorax. No acute osseus abnormality. IMPRESSION: Diffuse peribronchial thickening with prominence of the interstitial markings, greatest within the mid and lower left lung. Finding is nonspecific but may reflect sequela of bronchiolitis or possible atypical/ interstitial pneumonia. No consolidative airspace disease. Electronically Signed   By: Jeannine Boga M.D.   On: 04/25/2015 01:40   Ct Abdomen Pelvis W Contrast  04/25/2015  CLINICAL DATA:  Low abdominal pain and low chest pain since yesterday. Constipation on Tuesday and Wednesday. Fever. EXAM: CT ABDOMEN AND PELVIS WITH CONTRAST TECHNIQUE: Multidetector CT imaging of the abdomen and pelvis was performed using the standard protocol following bolus administration of intravenous contrast. CONTRAST:  119mL OMNIPAQUE IOHEXOL 300 MG/ML  SOLN COMPARISON:  None. FINDINGS: Interstitial fibrosis in the lung bases with ground-glass alveolitis particularly on the right. Calcified granulomas in the lung bases. Coronary artery calcifications. The gallbladder is distended. No stones are identified but there is evidence of wall thickening and edema with mild inflammatory reaction around the gallbladder. Mild intra and extrahepatic bile duct dilatation. No discrete stone or mass identified. There is infiltration around the distal aspect of the stomach  and duodenal bulb region and around the head of the pancreas. This is nonspecific and could represent inflammatory change due to peptic ulcer disease, cholecystitis, or focal pancreatitis. The spleen, inferior vena cava, and retroperitoneal lymph nodes are unremarkable. Calcification of the aorta without aneurysm. Subcentimeter cysts in the right kidney. No hydronephrosis in the kidneys. Nodule in the left adrenal gland measures 18 mm diameter. Hounsfield unit measurements are indeterminate but statistically this most likely represents benign adenoma. Consider followup in 1 year with noncontrast CT. Stomach, small bowel, and colon are mostly decompressed. No free air or free fluid in the abdomen. Pelvis: Prostate gland is mildly enlarged. Bladder wall is not thickened. No free or loculated pelvic fluid collections. No pelvic mass or lymphadenopathy. Rectosigmoid colon is unremarkable. Appendix is not identified. Degenerative changes in the spine. Spondylolysis with mild spondylolisthesis at L5-S1. No destructive bone lesions. Spinal stimulator device. IMPRESSION: Inflammatory process in the right upper quadrant with distended thick-walled gallbladder, bile duct dilatation, and infiltration in the adjacent fat. Etiology is nonspecific in could indicate cholecystitis, focal pancreatitis, or peptic ulcer disease. No evidence of bowel perforation or obstruction. Interstitial fibrosis and inflammatory changes in the lung bases. Electronically Signed   By: Oren Beckmann.D.  On: 04/25/2015 02:44   Ir Perc Cholecystostomy  04/25/2015  INDICATION: Acute cholecystitis. Patient is septic, with pharmacologic pressure support. EXAM: ULTRASOUND AND FLUOROSCOPIC-GUIDED CHOLECYSTOSTOMY TUBE PLACEMENT COMPARISON:  CT 04/25/2015 MEDICATIONS: Fentanyl 5.0 mcg IV; Versed 25 mg IV; The patient is currently admitted to the hospital and on intravenous antibiotics. Antibiotics were administered within an appropriate time frame  prior to skin puncture. ANESTHESIA/SEDATION: Total Moderate Sedation Time Twenty minutes CONTRAST:  7mL OMNIPAQUE IOHEXOL 300 MG/ML  SOLN FLUOROSCOPY TIME:  2 minutes 6 seconds COMPLICATIONS: None PROCEDURE: Informed written consent was obtained from the patient after a discussion of the risks, benefits and alternatives to treatment. Questions regarding the procedure were encouraged and answered. A timeout was performed prior to the initiation of the procedure. The right upper abdominal quadrant was prepped and draped in the usual sterile fashion, and a sterile drape was applied covering the operative field. Maximum barrier sterile technique with sterile gowns and gloves were used for the procedure. A timeout was performed prior to the initiation of the procedure. Local anesthesia was provided with 1% lidocaine with epinephrine. Ultrasound scanning of the right upper quadrant demonstrates a markedly dilated gallbladder. Of note, the patient reported pain with ultrasound imaging over the gallbladder. Utilizing a transhepatic approach, a 22 gauge needle was advanced into the gallbladder under direct ultrasound guidance. An ultrasound image was saved for documentation purposes. Appropriate intraluminal puncture was confirmed with the efflux of bile and advancement of an 0.018 wire into the gallbladder lumen. The needle was exchanged for an [place] set. A small amount of contrast was injected to confirm appropriate intraluminal positioning. Over a Benson wire, a 60.2-French Cook cholecystomy tube was advanced into the gallbladder fossa, coiled and locked. Bile was aspirated and a small amount of contrast was injected as several post procedural spot radiographic images were obtained in various obliquities. The catheter was secured to the skin with suture, connected to a drainage bag and a dressing was placed. The patient tolerated the procedure well without immediate post procedural complication. IMPRESSION: Status  post image guided percutaneous cholecystostomy tube. Sample sent for analysis. Signed, Dulcy Fanny. Earleen Newport, DO [facility] Electronically Signed   By: Corrie Mckusick D.O.   On: 04/25/2015 12:46   Dg Chest Port 1 View  05/01/2015  CLINICAL DATA:  DYSPNEA.  HYPERTENSION. EXAM: PORTABLE CHEST 1 VIEW COMPARISON:  04/30/2015 FINDINGS: STIMULATING LEADS ARE SUPERIMPOSED ON THE SPINE. PARTIAL CLEARANCE OF STREAKY LEFT UPPER LOBE OPACITY. THE RIGHT LUNG IS CLEAR. NO LARGE EFFUSIONS. NO PNEUMOTHORAX. HILAR AND MEDIASTINAL CONTOURS ARE UNREMARKABLE AND UNCHANGED. IMPRESSION: PARTIAL CLEARANCE OF LEFT UPPER LOBE OPACITIES. Electronically Signed   By: Andreas Newport M.D.   On: 05/01/2015 06:55   Dg Chest Port 1 View  04/30/2015  CLINICAL DATA:  Shortness of breath, septic gallbladder EXAM: PORTABLE CHEST 1 VIEW COMPARISON:  04/29/2015 FINDINGS: Cardiomediastinal silhouette is stable. Thoracic and cervical spinal stimulators again noted. Persistent streaky airspace opacifications in left upper lobe. Mild basilar atelectasis no convincing pulmonary edema. IMPRESSION: Persistent streaky airspace opacification in left upper lobe suspicious for infiltrate/pneumonia. No convincing pulmonary edema. Electronically Signed   By: Lahoma Crocker M.D.   On: 04/30/2015 08:17   Dg Chest Port 1 View  04/29/2015  CLINICAL DATA:  Shortness of breath and cough today. EXAM: PORTABLE CHEST 1 VIEW COMPARISON:  04/25/2015. FINDINGS: Trachea is midline. Heart size within normal limits. Right IJ central line has been removed. Mild patchy airspace opacification is seen bilaterally,  worst in the left upper lobe. Diagnostic quality is somewhat limited by apical lordotic view. No definite pleural fluid. IMPRESSION: Mild patchy bilateral airspace opacification, worst in the left upper lobe. Findings may be due to edema or pneumonia. Electronically Signed   By: Lorin Picket M.D.   On:  04/29/2015 08:05   Dg Chest Portable 1 View  04/25/2015  CLINICAL DATA:  Central line placement.  Initial encounter. EXAM: PORTABLE CHEST 1 VIEW COMPARISON:  Chest radiograph performed earlier today at 1:09 a.m. FINDINGS: The right IJ line is noted ending about the mid SVC. The lungs are hypoexpanded. Vascular crowding and vascular congestion are noted. Patchy bilateral opacities may reflect atelectasis or possibly mild pneumonia. No definite pleural effusion or pneumothorax is seen. The cardiomediastinal silhouette is normal in size. No acute osseous abnormalities are seen. Spinal stimulation leads are partially imaged. IMPRESSION: 1. Right IJ line noted ending about the mid SVC. 2. Lungs hypoexpanded. Vascular congestion noted. Patchy bilateral airspace opacities may reflect atelectasis or possibly mild pneumonia, more prominent than on the prior study. Electronically Signed   By: Garald Balding M.D.   On: 04/25/2015 07:11    ASSESSMENT AND PLAN  1. Acute cholecystitis: s/p percutaneous drain. No plan for surgery currently - to be considered in 6 wks. Abx per IM.   - Ok to discharge from cardiac perspective, I will make 7 day transition of care followup.  2. NSTEMI/CAD: Prior h/o stenting in 1995, 2000, and 2005.   - Trop mildly elevated in setting of #1 along with sepsis/hypotn on admission and then peaked @ 5.21 after going into rapid AF on the morning of 11/26.   - Cont asa, statin. Add bb as bp stable.   - echo 04/28/2015 EF 40-45%, moderate hypokinesis of anteroseptal, anterior, anterolateral and apical myocardium, mild AI/MR, peak PA pressure 3mmhg  - cath 04/30/2015 20% prox RCA lesion, 20% ost to prox LAD lesion, EF 45-50%.  - on ASA. If bleeding risk high, may stop ASA later given need for eliquis.   3. PAF: Went into AF on 11/26 @ approx 4:30 AM. Amio added yesterday and now in sinus. Echo pending. Cont IV amio for now given ongoing recovery and risk of going back into  AF. Add bb today. Hopefully can convert to by mouth amio w/in next 24-48 hrs. CHA2DS2VASc = 3. On heparin currently in setting of ACS. Not yet clear if he will require long term [place].  - amio converted to PO on 04/30/2015, plan 400mg  BID for 7 days (currenrtly day 3/7), then 200mg  BID for 7 days (start on 12/6, end 12/12), then 200mg  daily until post op   - ok to stop eliquis 72 hours preop. Eliquis started 05/01/2015 post cath.  4. Sepsis/Bacteremia: Hemodynamically stable. Abx per IM.  - WBC 12 --> 28 --> 23, unclear cause as patient looks and feel better.   5. Hypokalemia: Persistent. Supp.  6. Essential HTN: Stable. Was on ACEI @ home.  7. HL: Cont statin.  8. Possible LUL PNA on CXR: diminished breath sound in bibasilar area, otherwise CTA without obvious rale.   Hilbert Corrigan PA-C Pager: [phone]  I have seen, examined and evaluated the patient this AM along with Mr. Meng-PA-C.  After reviewing all the available data and chart,  I agree with his findings, examination as well as impression recommendations.  Looks great today - was transitioned to PO Amiodarone & Eliquis (see description above).  + Troponin/NSTEMI with no obstructive CAD on  cath - ? Demand Ischemia  Plan from [facility] is d/c today  Per his request - will arrange f/u @ [employer] with APP (Mr. Sharolyn Douglas) then long term f/u with me @ [place].  Leonie Man, M.D., M.S. Interventional Cardiologist   Pager # [phone]

## 2015-05-02 NOTE — Discharge Instructions (Signed)
Follow with Primary MD [name], MD in 2-3 days   Get CBC, CMP, 2 view Chest X ray checked  by Primary MD next visit.    Activity: As tolerated with Full fall precautions use walker/cane & assistance as needed   Disposition Home     Diet: Heart Healthy    For Heart failure patients - Check your Weight same time everyday, if you gain over 2 pounds, or you develop in leg swelling, experience more shortness of breath or chest pain, call your Primary MD immediately. Follow Cardiac Low Salt Diet and 1.5 lit/day fluid restriction.   On your next visit with your primary care physician please Get Medicines reviewed and adjusted.   Please request your Prim.MD to go over all Hospital Tests and Procedure/Radiological results at the follow up, please get all Hospital records sent to your Prim MD by signing hospital release before you go home.   If you experience worsening of your admission symptoms, develop shortness of breath, life threatening emergency, suicidal or homicidal thoughts you must seek medical attention immediately by calling 911 or calling your MD immediately  if symptoms less severe.  You Must read complete instructions/literature along with all the possible adverse reactions/side effects for all the Medicines you take and that have been prescribed to you. Take any new Medicines after you have completely understood and accpet all the possible adverse reactions/side effects.   Do not drive, operating heavy machinery, perform activities at heights, swimming or participation in water activities or provide baby sitting services if your were admitted for syncope or siezures until you have seen by Primary MD or a Neurologist and advised to do so again.  Do not drive when taking Pain medications.    Do not take more than prescribed Pain, Sleep and Anxiety Medications  Special Instructions: If you have smoked or chewed Tobacco  in the last 2 yrs please stop smoking, stop any  regular Alcohol  and or any Recreational drug use.  Wear Seat belts while driving.   Please note  You were cared for by a hospitalist during your hospital stay. If you have any questions about your discharge medications or the care you received while you were in the hospital after you are discharged, you can call the unit and asked to speak with the hospitalist on call if the hospitalist that took care of you is not available. Once you are discharged, your primary care physician will handle any further medical issues. Please note that NO REFILLS for any discharge medications will be authorized once you are discharged, as it is imperative that you return to your primary care physician (or establish a relationship with a primary care physician if you do not have one) for your aftercare needs so that they can reassess your need for medications and monitor your lab values.   Information on my medicine - ELIQUIS (apixaban)  Why was Eliquis prescribed for you? Eliquis was prescribed for you to reduce the risk of a blood clot forming that can cause a stroke if you have a medical condition called atrial fibrillation (a type of irregular heartbeat).  What do You need to know about Eliquis ? Take your Eliquis TWICE DAILY - one tablet in the morning and one tablet in the evening with or without food. If you have difficulty swallowing the tablet whole please discuss with your pharmacist how to take the medication safely.  Take Eliquis exactly as prescribed by your doctor and DO NOT stop taking  Eliquis without talking to the doctor who prescribed the medication.  Stopping may increase your risk of developing a stroke.  Refill your prescription before you run out.  After discharge, you should have regular check-up appointments with your healthcare provider that is prescribing your Eliquis.  In the future your dose may need to be changed if your kidney function or weight changes by a significant amount  or as you get older.  What do you do if you miss a dose? If you miss a dose, take it as soon as you remember on the same day and resume taking twice daily.  Do not take more than one dose of ELIQUIS at the same time to make up a missed dose.  Important Safety Information A possible side effect of Eliquis is bleeding. You should call your healthcare provider right away if you experience any of the following: ? Bleeding from an injury or your nose that does not stop. ? Unusual colored urine (red or dark brown) or unusual colored stools (red or black). ? Unusual bruising for unknown reasons. ? A serious fall or if you hit your head (even if there is no bleeding).  Some medicines may interact with Eliquis and might increase your risk of bleeding or clotting while on Eliquis. To help avoid this, consult your healthcare provider or pharmacist prior to using any new prescription or non-prescription medications, including herbals, vitamins, non-steroidal anti-inflammatory drugs (NSAIDs) and supplements.  This website has more information on Eliquis (apixaban): http://www.eliquis.com/eliquis/home

## 2015-05-02 NOTE — Discharge Summary (Addendum)
Alexander [name], is a 64 y.o. male  DOB 1951/03/27  MRN [id number].  Admission date:  04/25/2015  Admitting Physician  Raylene Miyamoto, MD  Discharge Date:  05/02/2015   Primary MD  [name], MD  Recommendations for primary care physician for things to follow:   Check CBC CMP in a week, close outpatient follow-up with IR, general surgery and cardiology.   Admission Diagnosis  Cholecystitis [K81.9]   Discharge Diagnosis  Cholecystitis [K81.9]     Principal Problem:   Cholecystitis Active Problems:   Post-polio syndrome   Chronic pain syndrome   OSA (obstructive sleep apnea)   HLD (hyperlipidemia)   CAD (coronary artery disease)   Septic shock (HCC)   Hypokalemia   Essential hypertension   NSTEMI (non-ST elevated myocardial infarction) (HCC)   Atrial fibrillation with RVR (HCC)   Bacteremia   CAD S/P percutaneous coronary angioplasty   Ischemic cardiomyopathy      Past Medical History  Diagnosis Date  . Arthritis   . History of depression   . Headache(784.0)   . CAD (coronary artery disease)     a. 1995 s/p BMS;  b. 2000 s/p BMS;  c. 2005 s/p stent - All stents in [facility] to unknown vessels;  d. 04/2015 NSTEMI/Cath: LM nl, LAD 20ost/p, patent mLAD stent, RI small, OM2 patent stent, RCA 20p, patent stent, EF 45-50%-->Med Rx.  . Essential hypertension   . HLD (hyperlipidemia)   . Migraines   . Neuropathy ([place])   . Post-polio syndrome     a. ambulates with braces.  . Shoulder pain, right   . OSA (obstructive sleep apnea)     does not want to use CPAP, using mouth guard  . Chronic pain syndrome     a. Followed @ [facility];  b. Uses 12-14 excedrins per day.  . Ischemic cardiomyopathy     a. 04/2015 Echo: EF 40-45%, mod antsept, ant, antlat, apical HK, mild AI/MR, PASP 7mmHg.     Past Surgical History  Procedure Laterality Date  . Coronary angioplasty with stent placement  1995, 2000, 2005  . Uvulopalatopharyngoplasty, tonsillectomy and septoplasty  2002  . Lumbar spine stimulator    . Cervical spine stimulator  08/2012  . Colonoscopy  09/2013    TA x2, rpt 5 yrs ([place])  . Cardiac catheterization N/A 04/30/2015    Procedure: Left Heart Cath and Coronary Angiography;  Surgeon: Troy Sine, MD;  Location: [facility];  Service: Cardiovascular;  Laterality: N/A;       HPI   :    64 year old male with history of poliomyelitis-CAD status post remote PCI-admitted with acute cholecystitis and septic shock. Admitted to the intensive care unit, started on pressors and broad-spectrum antibiotics. Seen by general surgery, subsequently had percutaneous cholecystostomy drain placed. Once improved, transferred to the hospitalist service on 11/27. Hospital course has been complicated by development of atrial fibrillation with RVR and non-STEMI.  While in hospital he was seen by general surgery, they recommended cholecystectomy  drain which was placed by IR on 04/25/2015, he was also seen by cardiology. In the meantime on 04/29/2015 he developed left upper lobe HCAP.  He has underwent left heart catheterization on 04/30/2015 which was unremarkable with nonocclusive CAD.     Hospital Course:     Septic shock due to acute acalculous cholecystitis with Escherichia coli and Klebsiella bacteremia.: Required pressors on admission. With antibiotics and supportive care, sepsis pathophysiology has now resolved. Antibiotics changed to vancomycin and Zosyn on 04/29/2015 due to HCAP, will be switched to Levaquin upon discharge, antibiotics for bacteremia for a total of 14 days. Will follow with general surgery and IR closely eventually may require cholecystectomy.  General surgery saw the patient in the hospital, he had cholecystectomy drain placed by IR on 04/25/2015 which  will be continued. Repeat blood cultures negative as above complete total of 14 days of antibiotics, switched to oral Levaquin upon discharge, cultures extremity noted.   Leukocytosis on 04/29/2015. Due to HCAP LUL, had switched antibiotics to vancomycin and Zosyn to cover for HCAP on 04/29/2015. Added flutter valve, aspiration precautions. Stable lactic acid and pro-calcitonin, negative Repeat blood cultures. Likely much better this morning, no cough or shortness of breath, no oxygen demand. Will be placed on Levaquin for 8 more days.   A. fib RVR: Cardiology consulted,started on amiodarone and oral beta blocker for rate control, and on heparin infusion CHADS2Vasc score of 3, request cardiology to consider Eliquis. TSH stable, Echogram noted. Cardiology following.  Non-STEMI: Has underlying CAD-suspect that A. fib RVR/hypotension probably precipitated non-STEMI. Continue aspirin, statin, IV heparin. [facility] onboard underwent left heart catheterization on 04/30/2015 which showed nonocclusive CAD, medical management per cardiology at this time. Continue beta blocker, statin and aspirin for secondary prevention.   Chronic systolic heart failure with EF 45% on echogram. Currently compensated. On beta blocker and low-dose ACE inhibitor monitor blood pressure in the outpatient setting closely.  Essential hypertension: Blood pressure controlled on low-dose ACE inhibitor and beta blocker.   GERD: Continue PPI  History of poliomyelitis with postpolio syndrome: Significant muscle atrophy in bilateral lower extremities. Continue Neurontin. PT eval when more stable. Normally ambulates with braces and walker as needed.      Discharge Condition: Stable  Follow UP  Follow-up Information    Follow up with Mickeal Skinner, MD. Call in 4 weeks.   Specialty:  General Surgery   Why:  For post-hospital follow up with Dr. Kieth Brightly the general surgeon to discuss your gallbladder tube and possible  surgery.  Call to confirm an appointment date/time.   Contact information:   [place] [address] [phone]       Follow up with [name], MD. Go on 05/03/2015.   Specialty:  Family Medicine   Why:  Follow up @130    Contact information:   [place] [address] [phone]       Follow up with Richardson Dopp, PA-C On 05/08/2015.   Specialties:  Physician Assistant, Radiology, Interventional Cardiology   Why:  2:40pm. Cardiology followup with Dr. Collier Salina Nishan's PA Richardson Dopp   Contact information:   [id number] [place] [address] [phone]       Follow up with Corrie Mckusick, DO. Call in 1 week.   Specialty:  Interventional Radiology   Contact information:   [address] [place] [address] [phone]        Consults obtained -  cardiology, pulmonary/intensive care and general surgery  Diet and Activity recommendation: See Discharge Instructions below  Discharge Instructions           Discharge Instructions    Discharge instructions    Complete by:  As directed   Follow with Primary MD [name], MD in 2-3 days   Get CBC, CMP, 2 view Chest X ray checked  by Primary MD next visit.    Activity: As tolerated with Full fall precautions use walker/cane & assistance as needed   Disposition Home     Diet: Heart Healthy    For Heart failure patients - Check your Weight same time everyday, if you gain over 2 pounds, or you develop in leg swelling, experience more shortness of breath or chest pain, call your Primary MD immediately. Follow Cardiac Low Salt Diet and 1.5 lit/day fluid restriction.   On your next visit with your primary care physician please Get Medicines reviewed and adjusted.   Please request your Prim.MD to go over all Hospital Tests and Procedure/Radiological results at the follow up, please get all Hospital records sent to your Prim MD by  signing hospital release before you go home.   If you experience worsening of your admission symptoms, develop shortness of breath, life threatening emergency, suicidal or homicidal thoughts you must seek medical attention immediately by calling 911 or calling your MD immediately  if symptoms less severe.  You Must read complete instructions/literature along with all the possible adverse reactions/side effects for all the Medicines you take and that have been prescribed to you. Take any new Medicines after you have completely understood and accpet all the possible adverse reactions/side effects.   Do not drive, operating heavy machinery, perform activities at heights, swimming or participation in water activities or provide baby sitting services if your were admitted for syncope or siezures until you have seen by Primary MD or a Neurologist and advised to do so again.  Do not drive when taking Pain medications.    Do not take more than prescribed Pain, Sleep and Anxiety Medications  Special Instructions: If you have smoked or chewed Tobacco  in the last 2 yrs please stop smoking, stop any regular Alcohol  and or any Recreational drug use.  Wear Seat belts while driving.   Please note  You were cared for by a hospitalist during your hospital stay. If you have any questions about your discharge medications or the care you received while you were in the hospital after you are discharged, you can call the unit and asked to speak with the hospitalist on call if the hospitalist that took care of you is not available. Once you are discharged, your primary care physician will handle any further medical issues. Please note that NO REFILLS for any discharge medications will be authorized once you are discharged, as it is imperative that you return to your primary care physician (or establish a relationship with a primary care physician if you do not have one) for your aftercare needs so that they can  reassess your need for medications and monitor your lab values.     Increase activity slowly    Complete by:  As directed              Discharge Medications       Medication List    STOP taking these medications        aspirin EC 325 MG tablet  Replaced by:  aspirin 81 MG chewable tablet  aspirin-acetaminophen-caffeine 250-250-65 MG tablet  Commonly known as:  EXCEDRIN MIGRAINE     ibuprofen 200 MG tablet  Commonly known as:  ADVIL,MOTRIN      TAKE these medications        amiodarone 200 MG tablet  Commonly known as:  PACERONE  Take 1 tablet (200 mg total) by mouth 2 (two) times daily.     apixaban 5 MG Tabs tablet  Commonly known as:  ELIQUIS  Take 1 tablet (5 mg total) by mouth 2 (two) times daily.     aspirin 81 MG chewable tablet  Chew 1 tablet (81 mg total) by mouth daily.     atorvastatin 80 MG tablet  Commonly known as:  LIPITOR  Take 1 tablet (80 mg total) by mouth daily at 6 PM.     Cholecalciferol 2000 UNITS Caps  Commonly known as:  [employer] VITAMIN D  Take 1 capsule (2,000 Units total) by mouth daily.     Fish Oil 1000 MG Caps  Take 1 capsule by mouth daily.     gabapentin 600 MG tablet  Commonly known as:  NEURONTIN  TAKE 2 TABLETS THREE TIMES A DAY     levofloxacin 750 MG tablet  Commonly known as:  LEVAQUIN  Take 1 tablet (750 mg total) by mouth daily.     lisinopril 2.5 MG tablet  Commonly known as:  PRINIVIL  Take 1 tablet (2.5 mg total) by mouth daily.     methocarbamol 500 MG tablet  Commonly known as:  ROBAXIN  TAKE 1 TABLET THREE TIMES A DAY AS NEEDED FOR MUSCLE SPASM     metoprolol tartrate 25 MG tablet  Commonly known as:  LOPRESSOR  Take 0.5 tablets (12.5 mg total) by mouth 2 (two) times daily.     MULTIVITAMIN ADULTS 50+ PO  Take 1 tablet by mouth daily.     omeprazole 40 MG capsule  Commonly known as:  PRILOSEC  TAKE 1 CAPSULE DAILY     sildenafil 100 MG tablet  Commonly known as:  VIAGRA  Take 0.5-1 tablets  (50-100 mg total) by mouth daily as needed for erectile dysfunction.     vitamin C 500 MG tablet  Commonly known as:  ASCORBIC ACID  Take 1,000 mg by mouth daily.     vitamin E 400 UNIT capsule  Take 400 Units by mouth daily.        Major procedures and Radiology Reports - PLEASE review detailed and final reports for all details, in brief -    11/24>>biliary perc drain  TTE  Left ventricle: The cavity size was normal. Systolic function wasmildly to moderately reduced. The estimated ejection fraction was in the range of 40% to 45%. Probable moderate hypokinesis of theanteroseptal, anterior, anterolateral, and apical myocardium. Left ventricular diastolic function parameters were normal. - Aortic valve: There was mild regurgitation. - Mitral valve: There was mild regurgitation. - Pulmonary arteries: PA peak pressure: 31 mm Hg (S).   L Heart Cath 04-30-15 - Dr Ellyn Hack   Prox RCA-1 lesion, 20% stenosed.  Ost LAD to Prox LAD lesion, 20% stenosed.  There is mild left ventricular systolic dysfunction.  Mild LV dysfunction with very subtle minimal mid anterolateral hypocontractility with a global ejection fraction of 45-50%.  No significant residual CAD with 10-20% luminal irregularity of the proximal LAD, and evidence for widely patent LAD stent after this region of irregularity; dominant left circumflex coronary artery with a widely patent stent in the marginal branch; nondominant RCA with  widely pain proximal stent with smooth 20% narrowing immediately proximal to the stented segment.  Recommendations: The present study did not demonstrate significant residual CAD, and all stents are widely patent. Heparin will be resumed 8 hours post sheath removal in light of his atrial fibrillation and troponin elevation of 5.     Dg Chest 2 View  04/25/2015  CLINICAL DATA:  Initial evaluation for possible sepsis. EXAM: CHEST  2 VIEW COMPARISON:  None. FINDINGS: Examination is  somewhat limited by technique and lordotic angulation. Cardiac and mediastinal silhouettes grossly within normal limits. Spinal stimulator electrodes noted. Lungs are hypoinflated. Mild right basilar atelectasis. There is diffuse peribronchial thickening with mild prominence of the interstitial markings, greatest within the left mid and lower lung. Finding is nonspecific, but may reflect sequela of bronchiolitis or possible atypical/interstitial pneumonia. No consolidative airspace opacity. No pulmonary edema or pleural effusion. No pneumothorax. No acute osseus abnormality. IMPRESSION: Diffuse peribronchial thickening with prominence of the interstitial markings, greatest within the mid and lower left lung. Finding is nonspecific but may reflect sequela of bronchiolitis or possible atypical/ interstitial pneumonia. No consolidative airspace disease. Electronically Signed   By: Jeannine Boga M.D.   On: 04/25/2015 01:40   Ct Abdomen Pelvis W Contrast  04/25/2015  CLINICAL DATA:  Low abdominal pain and low chest pain since yesterday. Constipation on Tuesday and Wednesday. Fever. EXAM: CT ABDOMEN AND PELVIS WITH CONTRAST TECHNIQUE: Multidetector CT imaging of the abdomen and pelvis was performed using the standard protocol following bolus administration of intravenous contrast. CONTRAST:  137mL OMNIPAQUE IOHEXOL 300 MG/ML  SOLN COMPARISON:  None. FINDINGS: Interstitial fibrosis in the lung bases with ground-glass alveolitis particularly on the right. Calcified granulomas in the lung bases. Coronary artery calcifications. The gallbladder is distended. No stones are identified but there is evidence of wall thickening and edema with mild inflammatory reaction around the gallbladder. Mild intra and extrahepatic bile duct dilatation. No discrete stone or mass identified. There is infiltration around the distal aspect of the stomach and duodenal bulb region and around the head of the pancreas. This is nonspecific  and could represent inflammatory change due to peptic ulcer disease, cholecystitis, or focal pancreatitis. The spleen, inferior vena cava, and retroperitoneal lymph nodes are unremarkable. Calcification of the aorta without aneurysm. Subcentimeter cysts in the right kidney. No hydronephrosis in the kidneys. Nodule in the left adrenal gland measures 18 mm diameter. Hounsfield unit measurements are indeterminate but statistically this most likely represents benign adenoma. Consider followup in 1 year with noncontrast CT. Stomach, small bowel, and colon are mostly decompressed. No free air or free fluid in the abdomen. Pelvis: Prostate gland is mildly enlarged. Bladder wall is not thickened. No free or loculated pelvic fluid collections. No pelvic mass or lymphadenopathy. Rectosigmoid colon is unremarkable. Appendix is not identified. Degenerative changes in the spine. Spondylolysis with mild spondylolisthesis at L5-S1. No destructive bone lesions. Spinal stimulator device. IMPRESSION: Inflammatory process in the right upper quadrant with distended thick-walled gallbladder, bile duct dilatation, and infiltration in the adjacent fat. Etiology is nonspecific in could indicate cholecystitis, focal pancreatitis, or peptic ulcer disease. No evidence of bowel perforation or obstruction. Interstitial fibrosis and inflammatory changes in the lung bases. Electronically Signed   By: Lucienne Capers M.D.   On: 04/25/2015 02:44   Ir Perc Cholecystostomy  04/25/2015  INDICATION: Acute cholecystitis. Patient is septic, with pharmacologic pressure support. EXAM: ULTRASOUND AND FLUOROSCOPIC-GUIDED CHOLECYSTOSTOMY TUBE PLACEMENT COMPARISON:  CT 04/25/2015 MEDICATIONS: Fentanyl 5.0 mcg IV; Versed 25 mg  IV; The patient is currently admitted to the hospital and on intravenous antibiotics. Antibiotics were administered within an appropriate time frame prior to skin puncture. ANESTHESIA/SEDATION: Total Moderate Sedation Time Twenty  minutes CONTRAST:  75mL OMNIPAQUE IOHEXOL 300 MG/ML  SOLN FLUOROSCOPY TIME:  2 minutes 6 seconds COMPLICATIONS: None PROCEDURE: Informed written consent was obtained from the patient after a discussion of the risks, benefits and alternatives to treatment. Questions regarding the procedure were encouraged and answered. A timeout was performed prior to the initiation of the procedure. The right upper abdominal quadrant was prepped and draped in the usual sterile fashion, and a sterile drape was applied covering the operative field. Maximum barrier sterile technique with sterile gowns and gloves were used for the procedure. A timeout was performed prior to the initiation of the procedure. Local anesthesia was provided with 1% lidocaine with epinephrine. Ultrasound scanning of the right upper quadrant demonstrates a markedly dilated gallbladder. Of note, the patient reported pain with ultrasound imaging over the gallbladder. Utilizing a transhepatic approach, a 22 gauge needle was advanced into the gallbladder under direct ultrasound guidance. An ultrasound image was saved for documentation purposes. Appropriate intraluminal puncture was confirmed with the efflux of bile and advancement of an 0.018 wire into the gallbladder lumen. The needle was exchanged for an [place] set. A small amount of contrast was injected to confirm appropriate intraluminal positioning. Over a Benson wire, a 84.2-French Cook cholecystomy tube was advanced into the gallbladder fossa, coiled and locked. Bile was aspirated and a small amount of contrast was injected as several post procedural spot radiographic images were obtained in various obliquities. The catheter was secured to the skin with suture, connected to a drainage bag and a dressing was placed. The patient tolerated the procedure well without immediate post procedural complication. IMPRESSION: Status post image guided percutaneous cholecystostomy tube. Sample sent for analysis.  Signed, Dulcy Fanny. Earleen Newport, DO [facility] Electronically Signed   By: Corrie Mckusick D.O.   On: 04/25/2015 12:46   Dg Chest Port 1 View  05/01/2015  CLINICAL DATA:  DYSPNEA.  HYPERTENSION. EXAM: PORTABLE CHEST 1 VIEW COMPARISON:  04/30/2015 FINDINGS: STIMULATING LEADS ARE SUPERIMPOSED ON THE SPINE. PARTIAL CLEARANCE OF STREAKY LEFT UPPER LOBE OPACITY. THE RIGHT LUNG IS CLEAR. NO LARGE EFFUSIONS. NO PNEUMOTHORAX. HILAR AND MEDIASTINAL CONTOURS ARE UNREMARKABLE AND UNCHANGED. IMPRESSION: PARTIAL CLEARANCE OF LEFT UPPER LOBE OPACITIES. Electronically Signed   By: Andreas Newport M.D.   On: 05/01/2015 06:55   Dg Chest Port 1 View  04/30/2015  CLINICAL DATA:  Shortness of breath, septic gallbladder EXAM: PORTABLE CHEST 1 VIEW COMPARISON:  04/29/2015 FINDINGS: Cardiomediastinal silhouette is stable. Thoracic and cervical spinal stimulators again noted. Persistent streaky airspace opacifications in left upper lobe. Mild basilar atelectasis no convincing pulmonary edema. IMPRESSION: Persistent streaky airspace opacification in left upper lobe suspicious for infiltrate/pneumonia. No convincing pulmonary edema. Electronically Signed   By: Lahoma Crocker M.D.   On: 04/30/2015 08:17   Dg Chest Port 1 View  04/29/2015  CLINICAL DATA:  Shortness of breath and cough today. EXAM: PORTABLE CHEST 1 VIEW COMPARISON:  04/25/2015. FINDINGS: Trachea is midline. Heart size within normal limits. Right IJ central line has been removed. Mild patchy airspace opacification is seen bilaterally, worst in the left upper lobe. Diagnostic quality is somewhat limited by apical lordotic view. No definite pleural fluid. IMPRESSION: Mild patchy bilateral airspace opacification, worst in the left upper lobe. Findings may be due to edema or pneumonia. Electronically  Signed   By: Lorin Picket M.D.   On: 04/29/2015 08:05   Dg Chest Portable 1 View  04/25/2015  CLINICAL DATA:  Central  line placement.  Initial encounter. EXAM: PORTABLE CHEST 1 VIEW COMPARISON:  Chest radiograph performed earlier today at 1:09 a.m. FINDINGS: The right IJ line is noted ending about the mid SVC. The lungs are hypoexpanded. Vascular crowding and vascular congestion are noted. Patchy bilateral opacities may reflect atelectasis or possibly mild pneumonia. No definite pleural effusion or pneumothorax is seen. The cardiomediastinal silhouette is normal in size. No acute osseous abnormalities are seen. Spinal stimulation leads are partially imaged. IMPRESSION: 1. Right IJ line noted ending about the mid SVC. 2. Lungs hypoexpanded. Vascular congestion noted. Patchy bilateral airspace opacities may reflect atelectasis or possibly mild pneumonia, more prominent than on the prior study. Electronically Signed   By: Garald Balding M.D.   On: 04/25/2015 07:11    Micro Results      Recent Results (from the past 240 hour(s))  Culture, blood (routine x 2)     Status: None   Collection Time: 04/25/15 12:45 AM  Result Value Ref Range Status   Specimen Description BLOOD LEFT ARM  Final   Special Requests BOTTLES DRAWN AEROBIC AND ANAEROBIC 5ML  Final   Culture  Setup Time   Final    GRAM NEGATIVE RODS IN BOTH AEROBIC AND ANAEROBIC BOTTLES CRITICAL RESULT CALLED TO, READ BACK BY AND VERIFIED WITH: L FLOOD 04/25/15 @ [facility] OXYTOCA   Final   Report Status 04/28/2015 FINAL  Final   Organism ID, Bacteria ESCHERICHIA COLI  Final   Organism ID, Bacteria KLEBSIELLA OXYTOCA  Final      Susceptibility   Escherichia coli - MIC*    AMPICILLIN >=32 RESISTANT Resistant     CEFAZOLIN <=4 SENSITIVE Sensitive     CEFEPIME <=1 SENSITIVE Sensitive     CEFTAZIDIME <=1 SENSITIVE Sensitive     CEFTRIAXONE <=1 SENSITIVE Sensitive     CIPROFLOXACIN <=0.25 SENSITIVE Sensitive     GENTAMICIN <=1 SENSITIVE Sensitive     IMIPENEM <=0.25 SENSITIVE Sensitive     TRIMETH/SULFA <=20  SENSITIVE Sensitive     AMPICILLIN/SULBACTAM 16 INTERMEDIATE Intermediate     PIP/TAZO <=4 SENSITIVE Sensitive     * ESCHERICHIA COLI   Klebsiella oxytoca - MIC*    AMPICILLIN >=32 RESISTANT Resistant     CEFAZOLIN 8 SENSITIVE Sensitive     CEFEPIME <=1 SENSITIVE Sensitive     CEFTAZIDIME <=1 SENSITIVE Sensitive     CEFTRIAXONE <=1 SENSITIVE Sensitive     CIPROFLOXACIN <=0.25 SENSITIVE Sensitive     GENTAMICIN <=1 SENSITIVE Sensitive     IMIPENEM 0.5 SENSITIVE Sensitive     TRIMETH/SULFA <=20 SENSITIVE Sensitive     AMPICILLIN/SULBACTAM 8 SENSITIVE Sensitive     PIP/TAZO <=4 SENSITIVE Sensitive     * KLEBSIELLA OXYTOCA  Culture, blood (routine x 2)     Status: None   Collection Time: 04/25/15 12:53 AM  Result Value Ref Range Status   Specimen Description BLOOD RIGHT ARM  Final   Special Requests BOTTLES DRAWN AEROBIC AND ANAEROBIC 5CC  Final   Culture  Setup Time   Final    GRAM NEGATIVE RODS IN BOTH AEROBIC AND ANAEROBIC BOTTLES CRITICAL RESULT CALLED TO, READ BACK BY AND VERIFIED WITH: L FLOOD 04/25/15 @ 1110 M [facility]    Culture   Final    ESCHERICHIA COLI SUSCEPTIBILITIES PERFORMED  ON PREVIOUS CULTURE WITHIN THE LAST 5 DAYS.    Report Status 04/29/2015 FINAL  Final  Urine culture     Status: None   Collection Time: 04/25/15  1:03 AM  Result Value Ref Range Status   Specimen Description URINE, CLEAN CATCH  Final   Special Requests NONE  Final   Culture 4,000 COLONIES/mL INSIGNIFICANT GROWTH  Final   Report Status 04/26/2015 FINAL  Final  MRSA PCR Screening     Status: None   Collection Time: 04/25/15 10:36 AM  Result Value Ref Range Status   MRSA by PCR NEGATIVE NEGATIVE Final    Comment:        The GeneXpert MRSA Assay (FDA approved for NASAL specimens only), is one component of a comprehensive MRSA colonization surveillance program. It is not intended to diagnose MRSA infection nor to guide or monitor treatment for MRSA infections.   Culture, body  fluid-bottle     Status: None   Collection Time: 04/25/15 12:21 PM  Result Value Ref Range Status   Specimen Description BILE  Final   Special Requests NONE  Final   Gram Stain MULTIPLE SPECIES PRESENT  Final   Culture MULTIPLE ORGANISMS PRESENT, NONE PREDOMINANT  Final   Report Status 04/27/2015 FINAL  Final  Gram stain     Status: None   Collection Time: 04/25/15 12:21 PM  Result Value Ref Range Status   Specimen Description BILE  Final   Special Requests NONE  Final   Gram Stain   Final    DEGENERATED CELLULAR MATERIAL PRESENT ABUNDANT GRAM POSITIVE AND GRAM NEGATIVE BACTERIA PRESENT CONSISTENT WITH LOWER GI FLORA RARE WBC PRESENT,BOTH PMN AND MONONUCLEAR NO YEAST OR FUNGAL ELEMENTS SEEN    Report Status 04/25/2015 FINAL  Final  Urine culture     Status: None   Collection Time: 04/29/15  8:44 AM  Result Value Ref Range Status   Specimen Description URINE, RANDOM  Final   Special Requests NONE  Final   Culture NO GROWTH 1 DAY  Final   Report Status 04/30/2015 FINAL  Final  Culture, blood (routine x 2)     Status: None (Preliminary result)   Collection Time: 04/29/15 10:45 AM  Result Value Ref Range Status   Specimen Description BLOOD LEFT HAND  Final   Special Requests BOTTLES DRAWN AEROBIC AND ANAEROBIC  10CC  Final   Culture NO GROWTH 2 DAYS  Final   Report Status PENDING  Incomplete  Culture, blood (routine x 2)     Status: None (Preliminary result)   Collection Time: 04/29/15 10:55 AM  Result Value Ref Range Status   Specimen Description BLOOD RIGHT HAND  Final   Special Requests BOTTLES DRAWN AEROBIC AND ANAEROBIC  10CC  Final   Culture NO GROWTH 2 DAYS  Final   Report Status PENDING  Incomplete    Today   Subjective    Alexander [name] today has no headache,no chest abdominal pain,no new weakness tingling or numbness, feels much better wants to go home today.     Objective   Blood pressure 110/61, pulse 64, temperature 98.2 F (36.8 C), temperature source  Oral, resp. rate 16, height 5\' 11"  (1.803 m), weight 81.784 kg (180 lb 4.8 oz), SpO2 98 %.   Intake/Output Summary (Last 24 hours) at 05/02/15 1155 Last data filed at 05/02/15 0900  Gross per 24 hour  Intake   1215 ml  Output   3285 ml  Net  -2070 ml    Exam  Awake Alert, Oriented x 3, No new F.N deficits, chronic bilateral lower extremity weakness due to polio, Normal affect [place].AT,PERRAL Supple Neck,No JVD, No cervical lymphadenopathy appriciated.  Symmetrical Chest wall movement, Good air movement bilaterally, CTAB RRR,No Gallops,Rubs or new Murmurs, No Parasternal Heave +ve B.Sounds, Abd Soft, Non tender, No organomegaly appriciated, No rebound -guarding or rigidity. Cholecystectomy drain in place No Cyanosis, Clubbing or edema, No new Rash or bruise   Data Review   CBC w Diff:  Lab Results  Component Value Date   WBC 20.6* 05/02/2015   HGB 13.0 05/02/2015   HCT 38.1* 05/02/2015   PLT 479* 05/02/2015   LYMPHOPCT 7 04/25/2015   MONOPCT 1 04/25/2015   EOSPCT 0 04/25/2015   BASOPCT 0 04/25/2015    CMP:  Lab Results  Component Value Date   NA 135 05/01/2015   K 3.9 05/01/2015   CL 106 05/01/2015   CO2 18* 05/01/2015   BUN 8 05/01/2015   CREATININE 0.62 05/01/2015   PROT 6.2* 05/01/2015   ALBUMIN 2.5* 05/01/2015   BILITOT 1.0 05/01/2015   ALKPHOS 90 05/01/2015   AST 17 05/01/2015   ALT 26 05/01/2015  .   Total Time in preparing paper work, data evaluation and todays exam - 35 minutes  Thurnell Lose M.D on 05/02/2015 at 11:55 AM  [facility]   Office  [phone]

## 2015-05-02 NOTE — Progress Notes (Signed)
Reviewed discharge instructions with patient and family. Iv removed. No issues at present. Awaiting transport via wheelchair.   Marielis Samara, Mervin Kung RN

## 2015-05-02 NOTE — Progress Notes (Signed)
Occupational Therapy Evaluation Patient Details Name: Alexander [name] MRN: [id number] DOB: 23-Jul-1950 Today's Date: 05/02/2015    History of Present Illness 64 year old male with history of poliomyelitis-CAD status post remote PCI-admitted with acute cholecystitis and septic shock. Admitted to the intensive care unit, started on pressors and broad-spectrum antibiotics. Seen by general surgery, subsequently had percutaneous cholecystostomy drain placed. Once improved, transferred to the hospitalist service on 11/27. Hospital course has been complicated by development of atrial fibrillation with RVR and non-STEMI. Cholecystectomy drain was placed by IR on 04/25/2015,04/29/2015 he developed left upper lobe HCAP. He has underwent left heart catheterization on 04/30/2015 which was unremarkable with nonocclusive CAD.   Clinical Impression   Pt admitted with the above diagnoses and presents with below problem list. PTA pt was mod I with ADLs. Pt is currently min guard with LB ADLs, functional transfers and mobility. Spouse present for session. ADL education provided including home setup and energy conservation techniques. Pt waiting d/c home. No further OT needs indicated. OT signing off.     Follow Up Recommendations  No OT follow up;Supervision - Intermittent;Other (comment) (OOB/mobility)    Equipment Recommendations  None recommended by OT    Recommendations for Other Services PT consult     Precautions / Restrictions Precautions Precautions: Fall Restrictions Weight Bearing Restrictions: No      Mobility Bed Mobility               General bed mobility comments: received sitting EOB  Transfers Overall transfer level: Needs assistance Equipment used: Rolling walker (2 wheeled) Transfers: Sit to/from Stand Sit to Stand: Min guard         General transfer comment: from EOB and commode. Min guard A for safety as pt did not have leg braces.     Balance Overall balance  assessment: Needs assistance Sitting-balance support: No upper extremity supported;Feet supported Sitting balance-Leahy Scale: Good Sitting balance - Comments: sitting EOB at start of session   Standing balance support: Bilateral upper extremity supported;During functional activity Standing balance-Leahy Scale: Poor                              ADL Overall ADL's : Needs assistance/impaired Eating/Feeding: Set up;Sitting   Grooming: Set up;Sitting   Upper Body Bathing: Set up;Sitting   Lower Body Bathing: Min guard;Sit to/from stand   Upper Body Dressing : Set up;Sitting   Lower Body Dressing: Min guard;Sit to/from stand Lower Body Dressing Details (indicate cue type and reason): able to access feet in unsupported sitting with some extra time Toilet Transfer: Min guard;Ambulation;Regular Toilet;Grab bars;RW   Toileting- Clothing Manipulation and Hygiene: Set up;Sitting/lateral lean   Tub/ Shower Transfer: Walk-in shower;Min guard;Ambulation;Rolling walker;Shower seat   Functional mobility during ADLs: Min guard;Rolling walker General ADL Comments: Pt completed in-room functional mobility and toilet trasnfer as detailed above. Pt in unsupported sitting position upon therapist arrival. Spouse present for session. RW utilized  in lieu of crutches due to line management. Close min guard as pr did not have leg braces.     Vision     Perception     Praxis      Pertinent Vitals/Pain Pain Assessment: Faces Faces Pain Scale: Hurts a little bit Pain Location: did not state Pain Intervention(s): Monitored during session     Hand Dominance     Extremity/Trunk Assessment Upper Extremity Assessment Upper Extremity Assessment: Overall WFL for tasks assessed;Generalized weakness   Lower Extremity Assessment Lower Extremity  Assessment: Defer to PT evaluation       Communication Communication Communication: No difficulties   Cognition Arousal/Alertness:  Awake/alert Behavior During Therapy: WFL for tasks assessed/performed Overall Cognitive Status: Within Functional Limits for tasks assessed                     General Comments       Exercises       Shoulder Instructions      Home Living Family/patient expects to be discharged to:: Private residence Living Arrangements: Spouse/significant other Available Help at Discharge: Family Type of Home: House Home Access: Stairs to enter Technical brewer of Steps: 4 Entrance Stairs-Rails: Can reach both;Right;Left Home Layout: One level     Bathroom Shower/Tub: Occupational psychologist: Handicapped height Bathroom Accessibility: Yes How Accessible: Accessible via walker [place];Shower seat;Toilet riser;Hand held shower head;Wheelchair - Press photographer          Prior Functioning/Environment Level of Independence: Independent with assistive device(s)        Comments: ambulates with braces on BLE and crutches    OT Diagnosis: Generalized weakness;Acute pain   OT Problem List:     OT Treatment/Interventions:      OT Goals(Current goals can be found in the care plan section) Acute Rehab OT Goals Patient Stated Goal: not stated, awaiting d/c home at time of eval OT Goal Formulation: With patient/family  OT Frequency:     Barriers to D/C:            Co-evaluation              End of Session Equipment Utilized During Treatment: Gait belt;Rolling walker  Activity Tolerance: Patient tolerated treatment well Patient left: with call bell/phone within reach;in bed;with family/visitor present   Time: 1130-1143 OT Time Calculation (min): 13 min Charges:  OT General Charges $OT Visit: 1 Procedure OT Evaluation $Initial OT Evaluation Tier I: 1 Procedure G-Codes:    Alexander [name] 05-14-2015, 12:07 PM

## 2015-05-02 NOTE — Progress Notes (Signed)
ANTIBIOTIC CONSULT NOTE - INITIAL & ANTICOAG Follow up  Pharmacy Consult for Levaquin Indication: HCAP and E. Coli / Kleb bacteremia  No Known Allergies  Patient Measurements: Height: 5\' 11"  (180.3 cm) Weight: 180 lb 4.8 oz (81.784 kg) IBW/kg (Calculated) : 75.3  Vital Signs: Temp: 98.2 F (36.8 C) (12/01 0759) Temp Source: Oral (12/01 0759) BP: 110/61 mmHg (12/01 0759) Pulse Rate: 64 (12/01 0759) Intake/Output from previous day: 11/30 0701 - 12/01 0700 In: [id number] [P.O.:1205; I.V.:10] Out: 3260 [Urine:3190; Drains:70]  Labs:  Recent Labs  04/30/15 0250 05/01/15 0600 05/02/15 0355  WBC 23.9* 22.8* 20.6*  HGB 11.7* 12.7* 13.0  PLT 336 417* 479*  CREATININE 0.60* 0.62  --      Microbiology: Cx data: 11/24   BCx2: 2/2 EColi, K.oxytoca   E.coli: Amp-R, Unasyn-I, sensitive to all others   K.oxytoca: Amp-R, sensitive all others  11/28 Urine cx > ngF  11/28 Blood cx > ngtd  Anti- infective's  Vanco11/24 >> 11/24 11/28 >>  Zosyn 11/24 >> 11/27 11/28 >>  Fluconazole 11/24 >> 11/24  Ceftriaxone 11/27 >> 11/28  Assessment: 64 yo M presents on 11/24 with sepsis. Found to have acute cholecystitis with E. Coli and Kleb bacteremia. Then with increase in WBC and CXR can not rule out PNA so broadened abx back to Zosyn and vancomycin. Afebrile, WBC back down to 20.6 (peaked at 28).  Now pharmacy consulted to transition to [place]. Day #8 of total abx. SCr stable, CrCl ~56ml/min.  Goal of Therapy:  Resolution of infection  Plan:  Stop vancomycin and Zosyn Start Levaquin 750mg  PO Q24 Monitor clinical picture, renal function F/U LOT (most likely total of 14 days)

## 2015-05-02 NOTE — Evaluation (Signed)
Physical Therapy Evaluation Patient Details Name: Alexander [name] MRN: [id number] DOB: 1950-08-19 Today's Date: 05/02/2015   History of Present Illness  64 year old male with history of poliomyelitis-CAD status post remote PCI-admitted with acute cholecystitis and septic shock. Admitted to the intensive care unit, started on pressors and broad-spectrum antibiotics. Seen by general surgery, subsequently had percutaneous cholecystostomy drain placed. Once improved, transferred to the hospitalist service on 11/27. Hospital course has been complicated by development of atrial fibrillation with RVR and non-STEMI. Cholecystectomy drain was placed by IR on 04/25/2015,04/29/2015 he developed left upper lobe HCAP. He has underwent left heart catheterization on 04/30/2015 which was unremarkable with nonocclusive CAD.  Clinical Impression  Pt admitted with above diagnosis. Pt currently with functional limitations due to the deficits listed below (see PT Problem List). Mr. Stoen declined ambulation into hallway as he did not have his Bil LE braces with him and he would like to conserve his energy for his anticipated return home today.  He has 24/7 assist from his wife at home and has all equipment currently needed. Pt ambulated in room w/ min guard assist provided from PT and min A to stabilize RW during sit<>stand. Pt will benefit from skilled PT to increase their independence and safety with mobility to allow discharge to the venue listed below.      Follow Up Recommendations No PT follow up    Equipment Recommendations  None recommended by PT    Recommendations for Other Services       Precautions / Restrictions Precautions Precautions: Fall Precaution Comments: post polio syndrome Restrictions Weight Bearing Restrictions: No      Mobility  Bed Mobility               General bed mobility comments: Pt sitting EOB upon PT arrival  Transfers Overall transfer level: Needs  assistance Equipment used: Rolling walker (2 wheeled) Transfers: Sit to/from Stand Sit to Stand: Min assist         General transfer comment: from EOB w/ min A from wife to stabilize RW.  Rocking to achieve momentum  Ambulation/Gait Ambulation/Gait assistance: Min guard Ambulation Distance (Feet): 10 Feet Assistive device: Rolling walker (2 wheeled) Gait Pattern/deviations: Step-through pattern;Antalgic;Decreased dorsiflexion - left;Decreased dorsiflexion - right;Decreased stride length;Steppage   Gait velocity interpretation: Below normal speed for age/gender General Gait Details: Pt uses momentum to swing into knee extension and performs steppage gait.    Stairs Stairs:  (Pt declines for energy conservation for returning home today)          Wheelchair Mobility    Modified Rankin (Stroke Patients Only)       Balance Overall balance assessment: Needs assistance Sitting-balance support: Feet supported Sitting balance-Leahy Scale: Good Sitting balance - Comments: sitting EOB at start of session   Standing balance support: Bilateral upper extremity supported;During functional activity Standing balance-Leahy Scale: Poor Standing balance comment: Relies on RW for support                             Pertinent Vitals/Pain Pain Assessment: No/denies pain Faces Pain Scale: Hurts a little bit Pain Location: did not state Pain Intervention(s): Monitored during session    Home Living Family/patient expects to be discharged to:: Private residence Living Arrangements: Spouse/significant other Available Help at Discharge: Family;Available 24 hours/day Type of Home: House Home Access: Stairs to enter Entrance Stairs-Rails: Can reach both;Right;Left Entrance Stairs-Number of Steps: 4 Home Layout: One level Home Equipment: Crutches;Shower seat;Toilet riser;Hand  held shower head;Wheelchair - Press photographer ([place] crutches)      Prior Function  Level of Independence: Independent with assistive device(s)         Comments: ambulates with braces on BLE and [place]ft[place] crutches     Hand Dominance        Extremity/Trunk Assessment   Upper Extremity Assessment: Defer to OT evaluation           Lower Extremity Assessment: RLE deficits/detail;LLE deficits/detail RLE Deficits / Details: 3/5 knee extension/hip add/hip abd, 2/5 knee flexion, 0/5 DF LLE Deficits / Details: 1/5 knee extension, 1/5 knee flexion, 0/5 DF, 3/5 hip abd/hip add     Communication   Communication: No difficulties  Cognition Arousal/Alertness: Awake/alert Behavior During Therapy: WFL for tasks assessed/performed Overall Cognitive Status: Within Functional Limits for tasks assessed                      General Comments      Exercises        Assessment/Plan    PT Assessment Patient needs continued PT services  PT Diagnosis Difficulty walking;Abnormality of gait   PT Problem List Decreased strength;Decreased range of motion;Decreased activity tolerance;Decreased balance;Decreased mobility  PT Treatment Interventions DME instruction;Gait training;Stair training;Functional mobility training;Therapeutic activities;Therapeutic exercise;Balance training;Neuromuscular re-education;Patient/family education;Wheelchair mobility training   PT Goals (Current goals can be found in the Care Plan section) Acute Rehab PT Goals Patient Stated Goal: to go home today PT Goal Formulation: With patient/family Time For Goal Achievement: 05/16/15 Potential to Achieve Goals: Good    Frequency Min 3X/week   Barriers to discharge        Co-evaluation               End of Session Equipment Utilized During Treatment: Gait belt Activity Tolerance: Patient tolerated treatment well Patient left: in bed;with call bell/phone within reach (sitting EOB) Nurse Communication: Mobility status;Precautions         Time: [id number] PT Time Calculation  (min) (ACUTE ONLY): 16 min   Charges:         PT G CodesJoslyn Hy PT, DPT [phone] Pager: [phone] 05/02/2015, 2:14 PM

## 2015-05-02 NOTE — Progress Notes (Signed)
Followup rescheduled with Dr. Ellyn Hack at [facility] as patient wish to follow up with Dr. Ellyn Hack. He lives in [place].  Hilbert Corrigan PA Pager: [phone]

## 2015-05-02 NOTE — Telephone Encounter (Signed)
New problem    Pt has TCM w/Scott 12.7.16 per Isaac Laud.

## 2015-05-03 ENCOUNTER — Ambulatory Visit (INDEPENDENT_AMBULATORY_CARE_PROVIDER_SITE_OTHER): Payer: Medicare Other | Admitting: Internal Medicine

## 2015-05-03 ENCOUNTER — Encounter: Payer: Self-pay | Admitting: Internal Medicine

## 2015-05-03 VITALS — BP 108/62 | HR 75 | Temp 98.1°F

## 2015-05-03 DIAGNOSIS — I2510 Atherosclerotic heart disease of native coronary artery without angina pectoris: Secondary | ICD-10-CM

## 2015-05-03 DIAGNOSIS — R6521 Severe sepsis with septic shock: Secondary | ICD-10-CM

## 2015-05-03 DIAGNOSIS — I214 Non-ST elevation (NSTEMI) myocardial infarction: Secondary | ICD-10-CM | POA: Diagnosis not present

## 2015-05-03 DIAGNOSIS — K810 Acute cholecystitis: Secondary | ICD-10-CM | POA: Diagnosis not present

## 2015-05-03 DIAGNOSIS — I4891 Unspecified atrial fibrillation: Secondary | ICD-10-CM | POA: Diagnosis not present

## 2015-05-03 DIAGNOSIS — A419 Sepsis, unspecified organism: Secondary | ICD-10-CM

## 2015-05-03 NOTE — Telephone Encounter (Signed)
Patient contacted regarding discharge from [facility] on Thursday 05/02/15.  Patient understands to follow up with Dr. Ellyn Hack on 05/15/15 at 2:30 pm [facility]. Patient understands discharge instructions? Yes Patient understands medications and regiment? Yes Patient understands to bring all medications to this visit? Yes

## 2015-05-03 NOTE — Progress Notes (Signed)
Subjective:    Patient ID: Alexander [name], male    DOB: 11-28-1950, 64 y.o.   MRN: [id number]  HPI  Pt presents to the clinic today for hospital followup. He went to the ER via EMS 11/24 with c/o fever and chills. He was found to have cholecystitis with septic shock. He was in the ICU on vasopressors and IV antibiotics. He grew Ecoli and Klebsiella in his blood cultures. He had a drain placed in his gallbladder in IR with plans to have it removed with cholecystectomy as an outpatient. His hospital course was complicated by development of Afib/flutter with an NSTEMI. He was eventually discharged 12/1 on Amiodarone, Eliquis and Levaquin. Since his discharge, he reports he feels weak. He is sleeping good and his appetite  Is back to normal. His bowels are moving normally, and he denies blood in his stool. He is urinating normally. He has some discomfort around the site of tube insertion but he denies abdominal pain. He has a follow up with cardiology in 10 days. He has a followup with the surgeon in January.  Review of Systems  Past Medical History  Diagnosis Date  . Arthritis   . History of depression   . Headache(784.0)   . CAD (coronary artery disease)     a. 1995 s/p BMS;  b. 2000 s/p BMS;  c. 2005 s/p stent - All stents in [facility] to unknown vessels;  d. 04/2015 NSTEMI/Cath: LM nl, LAD 20ost/p, patent mLAD stent, RI small, OM2 patent stent, RCA 20p, patent stent, EF 45-50%-->Med Rx.  . Essential hypertension   . HLD (hyperlipidemia)   . Migraines   . Neuropathy ([place])   . Post-polio syndrome     a. ambulates with braces.  . Shoulder pain, right   . OSA (obstructive sleep apnea)     does not want to use CPAP, using mouth guard  . Chronic pain syndrome     a. Followed @ [facility];  b. Uses 12-14 excedrins per day.  . Ischemic cardiomyopathy     a. 04/2015 Echo: EF 40-45%, mod antsept, ant, antlat, apical HK, mild AI/MR, PASP 63mmHg.    Current Outpatient Prescriptions    Medication Sig Dispense Refill  . amiodarone (PACERONE) 200 MG tablet Take 1 tablet (200 mg total) by mouth 2 (two) times daily. 60 tablet 0  . apixaban (ELIQUIS) 5 MG TABS tablet Take 1 tablet (5 mg total) by mouth 2 (two) times daily. 60 tablet 0  . aspirin 81 MG chewable tablet Chew 1 tablet (81 mg total) by mouth daily. 30 tablet 0  . atorvastatin (LIPITOR) 80 MG tablet Take 1 tablet (80 mg total) by mouth daily at 6 PM. 30 tablet 0  . Cholecalciferol (CVS VITAMIN D) 2000 UNITS CAPS Take 1 capsule (2,000 Units total) by mouth daily.    Marland Kitchen gabapentin (NEURONTIN) 600 MG tablet TAKE 2 TABLETS THREE TIMES A DAY 540 tablet 3  . levofloxacin (LEVAQUIN) 750 MG tablet Take 1 tablet (750 mg total) by mouth daily. 8 tablet 0  . lisinopril (PRINIVIL,ZESTRIL) 2.5 MG tablet Take 1 tablet (2.5 mg total) by mouth daily. 30 tablet 0  . methocarbamol (ROBAXIN) 500 MG tablet TAKE 1 TABLET THREE TIMES A DAY AS NEEDED FOR MUSCLE SPASM 90 tablet 1  . metoprolol tartrate (LOPRESSOR) 25 MG tablet Take 0.5 tablets (12.5 mg total) by mouth 2 (two) times daily. 30 tablet 0  . Multiple Vitamins-Minerals (MULTIVITAMIN ADULTS 50+ PO) Take 1 tablet by  mouth daily.    . Omega-3 Fatty Acids (FISH OIL) 1000 MG CAPS Take 1 capsule by mouth daily.    Marland Kitchen omeprazole (PRILOSEC) 40 MG capsule TAKE 1 CAPSULE DAILY 90 capsule 2  . sildenafil (VIAGRA) 100 MG tablet Take 0.5-1 tablets (50-100 mg total) by mouth daily as needed for erectile dysfunction. 18 tablet 3  . vitamin C (ASCORBIC ACID) 500 MG tablet Take 1,000 mg by mouth daily.    . vitamin E 400 UNIT capsule Take 400 Units by mouth daily.     No current facility-administered medications for this visit.    No Known Allergies  Family History  Problem Relation Age of Onset  . Diabetes Mother   . Cancer Sister 56    male cancer  . Coronary artery disease Neg Hx   . Stroke Neg Hx   . Colon cancer Neg Hx   . Pancreatic cancer Neg Hx   . Stomach cancer Neg Hx   .  Cancer Brother 7    breast    Social History   Social History  . Marital Status: Married    Spouse Name: N/A  . Number of Children: N/A  . Years of Education: N/A   Occupational History  . Not on file.   Social History Main Topics  . Smoking status: Never Smoker   . Smokeless tobacco: Never Used  . Alcohol Use: No  . Drug Use: No  . Sexual Activity: Not on file   Other Topics Concern  . 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 Denies fever, malaise, headache or abrupt weight changes.  Respiratory: Denies difficulty breathing, shortness of breath, cough or sputum production.   Cardiovascular: Denies chest pain, chest tightness, palpitations or swelling in the hands or feet.  Gastrointestinal: Denies abdominal pain, bloating, constipation, diarrhea or blood in the stool.  Skin: Denies redness, rashes, lesions or ulcercations.  Neurological: Denies dizziness, difficulty with memory, difficulty with speech.  Psych: Denies anxiety, depression, SI/HI.  No other specific complaints in a complete review of systems (except as listed in HPI above).     Objective:   Physical Exam  BP 108/62 mmHg  Pulse 75  Temp(Src) 98.1 F (36.7 C) (Oral)  Wt   SpO2 98% Wt Readings from Last 3 Encounters:  05/02/15 180 lb 4.8 oz (81.784 kg)  01/03/15 193 lb 12 oz (87.884 kg)  07/12/14 195 lb 12 oz (88.792 kg)    General: Appears his stated age, in NAD. Skin: Warm, dry and intact. Minimal redness noted around drain insertion site, but no warmth or drainage noted.  Cardiovascular: Normal rate and rhythm. S1,S2 noted.  No murmur, rubs or gallops noted.    Pulmonary/Chest: Normal effort and positive vesicular breath sounds. No respiratory distress. No wheezes, rales or ronchi noted.  Abdomen: Soft and nontender. Normal bowel sounds. Drain noted in RUQ. Neurological: Alert and oriented.   BMET    Component Value Date/Time   NA 135 05/01/2015 0600   K 3.9 05/01/2015 0600   CL 106 05/01/2015 0600   CO2 18* 05/01/2015 0600   GLUCOSE 130* 05/01/2015 0600  BUN 8 05/01/2015 0600   CREATININE 0.62 05/01/2015 0600   CALCIUM 8.3* 05/01/2015 0600   GFRNONAA >60 05/01/2015 0600   GFRAA >60 05/01/2015 0600    Lipid Panel     Component Value Date/Time   CHOL 209* 07/05/2014 0847   TRIG 101.0 07/05/2014 0847   HDL 54.80 07/05/2014 0847   CHOLHDL 4 07/05/2014 0847   VLDL 20.2 07/05/2014 0847   LDLCALC 134* 07/05/2014 0847    CBC    Component Value Date/Time   WBC 20.6* 05/02/2015 0355   RBC 3.98* 05/02/2015 0355   HGB 13.0 05/02/2015 0355   HCT 38.1* 05/02/2015 0355   PLT 479* 05/02/2015 0355   MCV 95.7 05/02/2015 0355   MCH 32.7 05/02/2015 0355   MCHC 34.1 05/02/2015 0355   RDW 14.3 05/02/2015 0355   LYMPHSABS 0.3* 04/25/2015 0045   MONOABS 0.1 04/25/2015 0045   EOSABS 0.0 04/25/2015 0045   BASOSABS 0.0 04/25/2015 0045    Hgb A1C Lab Results  Component Value Date   HGBA1C 5.9 07/05/2014         Assessment & Plan:   Hospital follow up for cholecystitis, septic shock, afib/flutter and nonSTEMI:  No medication changes for now Advised him to take things easy and not overdo himself Warning signs given for s/s of infection of drain site They will keep flushing drain as instructed priro CBC and CMET in 1 week They will follow up with cardiology and surgery as outpatient  Follow up with PCP as needed

## 2015-05-03 NOTE — Telephone Encounter (Signed)
Patient rescheduled to [facility] office. Will route to their office triage pool to call patient and address TCM call.

## 2015-05-04 LAB — CULTURE, BLOOD (ROUTINE X 2)
Culture: NO GROWTH
Culture: NO GROWTH

## 2015-05-05 ENCOUNTER — Encounter: Payer: Self-pay | Admitting: Internal Medicine

## 2015-05-05 NOTE — Patient Instructions (Signed)

## 2015-05-08 ENCOUNTER — Encounter: Payer: Medicare Other | Admitting: Physician Assistant

## 2015-05-10 ENCOUNTER — Other Ambulatory Visit (INDEPENDENT_AMBULATORY_CARE_PROVIDER_SITE_OTHER): Payer: Medicare Other

## 2015-05-10 DIAGNOSIS — A419 Sepsis, unspecified organism: Secondary | ICD-10-CM

## 2015-05-10 DIAGNOSIS — I214 Non-ST elevation (NSTEMI) myocardial infarction: Secondary | ICD-10-CM

## 2015-05-10 DIAGNOSIS — K810 Acute cholecystitis: Secondary | ICD-10-CM

## 2015-05-10 DIAGNOSIS — I4891 Unspecified atrial fibrillation: Secondary | ICD-10-CM | POA: Diagnosis not present

## 2015-05-10 DIAGNOSIS — R6521 Severe sepsis with septic shock: Secondary | ICD-10-CM

## 2015-05-10 LAB — CBC
HCT: 39.6 % (ref 39.0–52.0)
HEMOGLOBIN: 13.2 g/dL (ref 13.0–17.0)
MCHC: 33.2 g/dL (ref 30.0–36.0)
MCV: 95.6 fl (ref 78.0–100.0)
PLATELETS: 695 10*3/uL — AB (ref 150.0–400.0)
RBC: 4.15 Mil/uL — AB (ref 4.22–5.81)
RDW: 13.3 % (ref 11.5–15.5)
WBC: 10.8 10*3/uL — AB (ref 4.0–10.5)

## 2015-05-10 LAB — COMPREHENSIVE METABOLIC PANEL
ALBUMIN: 3.7 g/dL (ref 3.5–5.2)
ALK PHOS: 80 U/L (ref 39–117)
ALT: 15 U/L (ref 0–53)
AST: 12 U/L (ref 0–37)
BILIRUBIN TOTAL: 0.5 mg/dL (ref 0.2–1.2)
BUN: 14 mg/dL (ref 6–23)
CO2: 30 mEq/L (ref 19–32)
Calcium: 9.3 mg/dL (ref 8.4–10.5)
Chloride: 98 mEq/L (ref 96–112)
Creatinine, Ser: 0.66 mg/dL (ref 0.40–1.50)
GFR: 128.67 mL/min (ref >60.00)
GLUCOSE: 94 mg/dL (ref 70–99)
Potassium: 4.2 mEq/L (ref 3.5–5.1)
Sodium: 135 mEq/L (ref 135–145)
TOTAL PROTEIN: 7.4 g/dL (ref 6.0–8.3)

## 2015-05-13 ENCOUNTER — Ambulatory Visit (INDEPENDENT_AMBULATORY_CARE_PROVIDER_SITE_OTHER): Payer: Medicare Other | Admitting: Nurse Practitioner

## 2015-05-13 ENCOUNTER — Encounter: Payer: Self-pay | Admitting: Nurse Practitioner

## 2015-05-13 VITALS — BP 100/54 | HR 51 | Ht 71.0 in | Wt 192.2 lb

## 2015-05-13 DIAGNOSIS — I255 Ischemic cardiomyopathy: Secondary | ICD-10-CM

## 2015-05-13 DIAGNOSIS — I4891 Unspecified atrial fibrillation: Secondary | ICD-10-CM | POA: Insufficient documentation

## 2015-05-13 DIAGNOSIS — Z8679 Personal history of other diseases of the circulatory system: Secondary | ICD-10-CM | POA: Insufficient documentation

## 2015-05-13 DIAGNOSIS — I2510 Atherosclerotic heart disease of native coronary artery without angina pectoris: Secondary | ICD-10-CM

## 2015-05-13 DIAGNOSIS — I214 Non-ST elevation (NSTEMI) myocardial infarction: Secondary | ICD-10-CM

## 2015-05-13 DIAGNOSIS — E785 Hyperlipidemia, unspecified: Secondary | ICD-10-CM

## 2015-05-13 DIAGNOSIS — I480 Paroxysmal atrial fibrillation: Secondary | ICD-10-CM

## 2015-05-13 DIAGNOSIS — I10 Essential (primary) hypertension: Secondary | ICD-10-CM

## 2015-05-13 DIAGNOSIS — Z008 Encounter for other general examination: Secondary | ICD-10-CM

## 2015-05-13 MED ORDER — ATORVASTATIN CALCIUM 80 MG PO TABS: 80.0000 mg | ORAL_TABLET | Freq: Every day | ORAL | Status: DC

## 2015-05-13 MED ORDER — APIXABAN 5 MG PO TABS: 5.0000 mg | ORAL_TABLET | Freq: Two times a day (BID) | ORAL | Status: DC

## 2015-05-13 MED ORDER — METOPROLOL TARTRATE 25 MG PO TABS
12.5000 mg | ORAL_TABLET | Freq: Two times a day (BID) | ORAL | Status: DC
Start: 2015-05-13 — End: 2015-05-30

## 2015-05-13 MED ORDER — NORMAL SALINE FLUSH 0.9 % IV SOLN: 10.0000 mL | Freq: Every morning | INTRAVENOUS | Status: DC

## 2015-05-13 MED ORDER — LISINOPRIL 2.5 MG PO TABS
2.5000 mg | ORAL_TABLET | Freq: Every day | ORAL | Status: DC
Start: 2015-05-13 — End: 2015-05-30

## 2015-05-13 NOTE — Patient Instructions (Signed)
Medication Instructions:  Your physician recommends that you continue on your current medications as directed. Please refer to the Current Medication list given to you today.   Labwork: none  Testing/Procedures: none  Follow-Up: Your physician wants you to follow-up in: six months with Dr. Harding.  You will receive a reminder letter in the mail two months in advance. If you don't receive a letter, please call our office to schedule the follow-up appointment.   Any Other Special Instructions Will Be Listed Below (If Applicable).     If you need a refill on your cardiac medications before your next appointment, please call your pharmacy.   

## 2015-05-13 NOTE — Progress Notes (Signed)
Patient Name: Alexander [name] Date of Encounter: 05/13/2015  Primary Care Provider:  [name], MD Primary Cardiologist:  Roni Bread, MD [facility] office)  Chief Complaint  64 y/o male with a h/o CAD s/p recent hospitalization for cholecystitis complicated by sepsis, AF, and NSTEMI, who presents for f/u.  Past Medical History   Past Medical History  Diagnosis Date  . Arthritis   . History of depression   . Headache(784.0)   . CAD (coronary artery disease)     a. 1995 s/p BMS;  b. 2000 s/p BMS;  c. 2005 s/p stent - All stents in [facility] to unknown vessels;  d. 04/2015 NSTEMI/Cath: LM nl, LAD 20ost/p, patent mLAD stent, RI small, OM2 patent stent, RCA 20p, patent stent, EF 45-50%-->Med Rx.  . Essential hypertension   . HLD (hyperlipidemia)   . Migraines   . Neuropathy ([place])   . Post-polio syndrome     a. ambulates with braces.  . Shoulder pain, right   . OSA (obstructive sleep apnea)     does not want to use CPAP, using mouth guard  . Chronic pain syndrome     a. Followed @ [facility];  b. Uses 12-14 excedrins per day.  . Ischemic cardiomyopathy     a. 04/2015 Echo: EF 40-45%, mod antsept, ant, antlat, apical HK, mild AI/MR, PASP 80mmHg.   Past Surgical History  Procedure Laterality Date  . Uvulopalatopharyngoplasty, tonsillectomy and septoplasty  2002  . Lumbar spine stimulator    . Cervical spine stimulator  08/2012  . Colonoscopy  09/2013    TA x2, rpt 5 yrs ([place])  . Coronary angioplasty with stent placement  1995, 2000, 2005  . Cardiac catheterization N/A 04/30/2015    Procedure: Left Heart Cath and Coronary Angiography;  Surgeon: Troy Sine, MD;  Location: [place] [facility];  Service: Cardiovascular;  Laterality: N/A;   Allergies  No Known Allergies  HPI  64 y/o male with the above complex PMH.  He has a h/o polio as a child with difficulty walking since (uses braces or walker). He also has a h/o CAD s/p stenting in 1995, 2000, and 2005 -  all in [place]. He was recently admitted to [facility] with abd pain and acute cholecystitis.  In that setting, he was hypotensive and also c/o chest pain.  He was felt to be too high risk for surgery and subsequently underwent perc drain. In the setting of sepsis with hypotension, he was also seen by CCM with central line placement and initiation of pressors and abx. Pt stabilized and pressor were weaned by the PM of 11/24. He later developed AF with RVR, which was asymptomatic and converted after initiating amio.  Troponin rose to 5.21 and echo showed an EF of 40-45%.  Once stabilized, we performed diagnostic cath revealing non-obs dzs and medical therapy was recommended and he has been maintained on asa, eliquis, amio, bb, acei, and statin.  Eliquis was added post-cath.  Since d/c, pt has done well.  He says that his strength is steadily coming back to him.  He denies any palpitations, c/p, or dyspnea and is tolerating his medications well.  He still has the percutaneous drain and has f/u with [facility] on 06/07/2015 with plan for cholecystectomy at a later date in January.  He denies pnd, orthopnea, n, v, dizziness, syncope, edema, weight gain, or early satiety.   Home Medications  Prior to Admission medications   Medication Sig Start Date End Date  Taking? Authorizing Provider  amiodarone (PACERONE) 200 MG tablet Take 1 tablet (200 mg total) by mouth 2 (two) times daily. 05/02/15  Yes Thurnell Lose, MD  apixaban (ELIQUIS) 5 MG TABS tablet Take 1 tablet (5 mg total) by mouth 2 (two) times daily. 05/02/15  Yes Thurnell Lose, MD  aspirin 81 MG chewable tablet Chew 1 tablet (81 mg total) by mouth daily. 05/02/15  Yes Thurnell Lose, MD  atorvastatin (LIPITOR) 80 MG tablet Take 1 tablet (80 mg total) by mouth daily at 6 PM. 05/02/15  Yes Thurnell Lose, MD  Cholecalciferol (CVS VITAMIN D) 2000 UNITS CAPS Take 1 capsule (2,000 Units total) by mouth daily. 03/29/13  Yes [name], MD  gabapentin  (NEURONTIN) 600 MG tablet TAKE 2 TABLETS THREE TIMES A DAY 03/11/15  Yes [name], MD  lisinopril (PRINIVIL,ZESTRIL) 2.5 MG tablet Take 1 tablet (2.5 mg total) by mouth daily. 05/02/15  Yes Thurnell Lose, MD  methocarbamol (ROBAXIN) 500 MG tablet TAKE 1 TABLET THREE TIMES A DAY AS NEEDED FOR MUSCLE SPASM 10/04/14  Yes [name], MD  metoprolol tartrate (LOPRESSOR) 25 MG tablet Take 0.5 tablets (12.5 mg total) by mouth 2 (two) times daily. 05/02/15  Yes Thurnell Lose, MD  Multiple Vitamins-Minerals (MULTIVITAMIN ADULTS 50+ PO) Take 1 tablet by mouth daily.   Yes Historical Provider, MD  Omega-3 Fatty Acids (FISH OIL) 1000 MG CAPS Take 1 capsule by mouth daily.   Yes Historical Provider, MD  omeprazole (PRILOSEC) 40 MG capsule TAKE 1 CAPSULE DAILY 11/26/14  Yes [name], MD  sildenafil (VIAGRA) 100 MG tablet Take 0.5-1 tablets (50-100 mg total) by mouth daily as needed for erectile dysfunction. 01/03/15  Yes [name], MD  vitamin C (ASCORBIC ACID) 500 MG tablet Take 1,000 mg by mouth daily.   Yes Historical Provider, MD  vitamin E 400 UNIT capsule Take 400 Units by mouth daily.   Yes Historical Provider, MD    Review of Systems  As above, doing well.  He denies chest pain, palpitations, dyspnea, pnd, orthopnea, n, v, dizziness, syncope, edema, weight gain, or early satiety.  All other systems reviewed and are otherwise negative except as noted above.  Physical Exam  VS:  BP 100/54 mmHg  Pulse 51  Ht 5\' 11"  (1.803 m)  Wt 192 lb 4 oz (87.204 kg)  BMI 26.83 kg/m2 , BMI Body mass index is 26.83 kg/(m^2). GEN: Well nourished, well developed, in no acute distress. HEENT: normal. Neck: Supple, no JVD, carotid bruits, or masses. Cardiac: RRR, no murmurs, rubs, or gallops. No clubbing, cyanosis, edema.  Radials/DP/PT 2+ and equal bilaterally.  Respiratory:  Respirations regular and unlabored, clear to auscultation bilaterally. GI: Soft, nontender, nondistended, BS + x  4. Perc drain from RUQ. MS: no deformity or atrophy. Skin: warm and dry, no rash. Neuro:  Strength and sensation are intact. Psych: Normal affect.  Accessory Clinical Findings  ECG - SB, 51, no acute st/t changes.  Assessment & Plan  1.  NSTEMI, subsequent episode of care/CAD:  S/p recent hospitalization for cholecystitis and sepsis complicated nstemi and AF.  Cath revealed non-obs dzs and he has since been medically managed on asa, bb, acei, statin.  No chest pain since hospitalization.  He will not need any additional ischemic testing prior to cholecystectomy and should remain on bb/statin throughout the perioperative period.  2.  PAF:  In setting of cholecystitis and sepsis during recent hospitalization, pt developed AF.  This converted with  amio and he was also placed on eliquis 5 mg bid with a CHA2DS2VASc of 4.  He remains on amio 200 bid and I will leave him on this dose at least to get him through his surgery in January.  Following that, we will likely reduce his amio dose to 200 mg daily.  He may come off of eliquis 3 days prior to surgery and will not require bridging.  I will arrange for a baseline PFT given amio therapy.  3.  ICM:  EF 40-45% by recent echo.  Euvolemic on exam today. Cont low-dose bb and acei.  Doses of both are limited by soft BP and bradycardia.  He will require close attention to his volume status and IVF in the perioperative period surrounding his pending cholecystectomy.  4.  Essential HTN:  BP is soft today.  He is asymptomatic.  Cont bb/acei.  5.  HL:  LDL 134 earlier this year.  LFT's nl on 05/10/2015.  F/u lipids at a later date.  6.  Cholecystitis:  Recent hospitalization as above with plan for [facility] f/u in early January. Perc drain in place and pt doing well overall.  7.  Disposition:  F/u Dr. Ellyn Hack in ~ 2 mos or sooner if necessary.   Murray Hodgkins, NP 05/13/2015, 11:32 AM

## 2015-05-15 ENCOUNTER — Emergency Department (HOSPITAL_COMMUNITY)
Admission: EM | Admit: 2015-05-15 | Discharge: 2015-05-15 | Disposition: A | Discharge status: Home / Self Care | Payer: Medicare Other | Attending: Emergency Medicine | Admitting: Emergency Medicine

## 2015-05-15 ENCOUNTER — Encounter (HOSPITAL_COMMUNITY): Payer: Self-pay | Admitting: Emergency Medicine

## 2015-05-15 ENCOUNTER — Emergency Department (HOSPITAL_COMMUNITY): Payer: Medicare Other

## 2015-05-15 ENCOUNTER — Encounter: Payer: Medicare Other | Admitting: Cardiology

## 2015-05-15 DIAGNOSIS — Z79899 Other long term (current) drug therapy: Secondary | ICD-10-CM | POA: Diagnosis not present

## 2015-05-15 DIAGNOSIS — Z9861 Coronary angioplasty status: Secondary | ICD-10-CM | POA: Insufficient documentation

## 2015-05-15 DIAGNOSIS — M1990 Unspecified osteoarthritis, unspecified site: Secondary | ICD-10-CM | POA: Insufficient documentation

## 2015-05-15 DIAGNOSIS — I10 Essential (primary) hypertension: Secondary | ICD-10-CM | POA: Insufficient documentation

## 2015-05-15 DIAGNOSIS — E785 Hyperlipidemia, unspecified: Secondary | ICD-10-CM | POA: Diagnosis not present

## 2015-05-15 DIAGNOSIS — R1012 Left upper quadrant pain: Secondary | ICD-10-CM | POA: Insufficient documentation

## 2015-05-15 DIAGNOSIS — Z8659 Personal history of other mental and behavioral disorders: Secondary | ICD-10-CM | POA: Diagnosis not present

## 2015-05-15 DIAGNOSIS — G894 Chronic pain syndrome: Secondary | ICD-10-CM | POA: Diagnosis not present

## 2015-05-15 DIAGNOSIS — D72829 Elevated white blood cell count, unspecified: Secondary | ICD-10-CM | POA: Diagnosis not present

## 2015-05-15 DIAGNOSIS — Z7982 Long term (current) use of aspirin: Secondary | ICD-10-CM | POA: Insufficient documentation

## 2015-05-15 DIAGNOSIS — Z7901 Long term (current) use of anticoagulants: Secondary | ICD-10-CM | POA: Diagnosis not present

## 2015-05-15 DIAGNOSIS — G43909 Migraine, unspecified, not intractable, without status migrainosus: Secondary | ICD-10-CM | POA: Insufficient documentation

## 2015-05-15 DIAGNOSIS — R110 Nausea: Secondary | ICD-10-CM | POA: Diagnosis not present

## 2015-05-15 DIAGNOSIS — Z8719 Personal history of other diseases of the digestive system: Secondary | ICD-10-CM | POA: Insufficient documentation

## 2015-05-15 DIAGNOSIS — R6883 Chills (without fever): Secondary | ICD-10-CM | POA: Insufficient documentation

## 2015-05-15 DIAGNOSIS — I2510 Atherosclerotic heart disease of native coronary artery without angina pectoris: Secondary | ICD-10-CM | POA: Diagnosis not present

## 2015-05-15 DIAGNOSIS — R109 Unspecified abdominal pain: Secondary | ICD-10-CM

## 2015-05-15 DIAGNOSIS — Z978 Presence of other specified devices: Secondary | ICD-10-CM | POA: Diagnosis not present

## 2015-05-15 DIAGNOSIS — Z9889 Other specified postprocedural states: Secondary | ICD-10-CM | POA: Insufficient documentation

## 2015-05-15 DIAGNOSIS — Z8612 Personal history of poliomyelitis: Secondary | ICD-10-CM | POA: Insufficient documentation

## 2015-05-15 DIAGNOSIS — R1013 Epigastric pain: Secondary | ICD-10-CM | POA: Diagnosis present

## 2015-05-15 LAB — COMPREHENSIVE METABOLIC PANEL
ALBUMIN: 3.4 g/dL — AB (ref 3.5–5.0)
ALK PHOS: 101 U/L (ref 38–126)
ALT: 27 U/L (ref 17–63)
AST: 35 U/L (ref 15–41)
Anion gap: 9 (ref 5–15)
BILIRUBIN TOTAL: 0.6 mg/dL (ref 0.3–1.2)
BUN: 11 mg/dL (ref 6–20)
CALCIUM: 8.9 mg/dL (ref 8.9–10.3)
CO2: 26 mmol/L (ref 22–32)
Chloride: 100 mmol/L — ABNORMAL LOW (ref 101–111)
Creatinine, Ser: 0.69 mg/dL (ref 0.61–1.24)
GFR calc Af Amer: >60 mL/min (ref >60)
GFR calc non Af Amer: >60 mL/min (ref >60)
GLUCOSE: 146 mg/dL — AB (ref 65–99)
Potassium: 3.7 mmol/L (ref 3.5–5.1)
Sodium: 135 mmol/L (ref 135–145)
TOTAL PROTEIN: 7.2 g/dL (ref 6.5–8.1)

## 2015-05-15 LAB — CBC WITH DIFFERENTIAL/PLATELET
BASOS ABS: 0.1 10*3/uL (ref 0.0–0.1)
BASOS PCT: 0 %
Eosinophils Absolute: 0.5 10*3/uL (ref 0.0–0.7)
Eosinophils Relative: 3 %
HEMATOCRIT: 37.1 % — AB (ref 39.0–52.0)
HEMOGLOBIN: 12.3 g/dL — AB (ref 13.0–17.0)
Lymphocytes Relative: 17 %
Lymphs Abs: 2.4 10*3/uL (ref 0.7–4.0)
MCH: 31.5 pg (ref 26.0–34.0)
MCHC: 33.2 g/dL (ref 30.0–36.0)
MCV: 95.1 fL (ref 78.0–100.0)
Monocytes Absolute: 0.9 10*3/uL (ref 0.1–1.0)
Monocytes Relative: 6 %
NEUTROS ABS: 10.5 10*3/uL — AB (ref 1.7–7.7)
NEUTROS PCT: 74 %
Platelets: 366 10*3/uL (ref 150–400)
RBC: 3.9 MIL/uL — ABNORMAL LOW (ref 4.22–5.81)
RDW: 13.3 % (ref 11.5–15.5)
WBC: 14.4 10*3/uL — ABNORMAL HIGH (ref 4.0–10.5)

## 2015-05-15 LAB — URINALYSIS, ROUTINE W REFLEX MICROSCOPIC
BILIRUBIN URINE: NEGATIVE
Glucose, UA: NEGATIVE mg/dL
HGB URINE DIPSTICK: NEGATIVE
Ketones, ur: NEGATIVE mg/dL
Leukocytes, UA: NEGATIVE
NITRITE: NEGATIVE
PH: 7.5 (ref 5.0–8.0)
Protein, ur: NEGATIVE mg/dL
SPECIFIC GRAVITY, URINE: 1.015 (ref 1.005–1.030)

## 2015-05-15 LAB — I-STAT CG4 LACTIC ACID, ED: Lactic Acid, Venous: 1.66 mmol/L (ref 0.5–2.0)

## 2015-05-15 LAB — LIPASE, BLOOD: Lipase: 44 U/L (ref 11–51)

## 2015-05-15 MED ORDER — IOHEXOL 300 MG/ML  SOLN
100.0000 mL | Freq: Once | INTRAMUSCULAR | Status: AC | PRN
  Administered 2015-05-15: 100 mL via INTRAVENOUS

## 2015-05-15 MED ORDER — ONDANSETRON HCL 4 MG/2ML IJ SOLN
4.0000 mg | Freq: Once | INTRAMUSCULAR | Status: AC
  Administered 2015-05-15: 4 mg via INTRAVENOUS
  Filled 2015-05-15: qty 2

## 2015-05-15 MED ORDER — SODIUM CHLORIDE 0.9 % IV SOLN
1000.0000 mL | INTRAVENOUS | Status: DC
  Administered 2015-05-15: 1000 mL via INTRAVENOUS

## 2015-05-15 MED ORDER — SODIUM CHLORIDE 0.9 % IV BOLUS (SEPSIS)
1000.0000 mL | Freq: Once | INTRAVENOUS | Status: AC
Start: 2015-05-15 — End: 2015-05-15
  Administered 2015-05-15: 1000 mL via INTRAVENOUS

## 2015-05-15 MED ORDER — CIPROFLOXACIN HCL 500 MG PO TABS: 500.0000 mg | ORAL_TABLET | Freq: Two times a day (BID) | ORAL | Status: DC

## 2015-05-15 NOTE — ED Notes (Signed)
MD at bedside. 

## 2015-05-15 NOTE — ED Notes (Addendum)
Pt to ED via [facility] for upper abd burning x 1 hour.  Pt states symptoms started immediately after drinking [employer].  Initially c/o nausea, but currently denies.

## 2015-05-15 NOTE — Discharge Instructions (Signed)
1. Medications: cipro, usual home medications 2. Treatment: rest, drink plenty of fluids 3. Follow Up: please followup with general surgery for discussion of your diagnoses and further evaluation after today's visit as well as for proctoscopy; please return to the ER for high fever, severe pain, new or worsening symptoms   Abdominal Pain, Adult Many things can cause abdominal pain. Usually, abdominal pain is not caused by a disease and will improve without treatment. It can often be observed and treated at home. Your health care provider will do a physical exam and possibly order blood tests and X-rays to help determine the seriousness of your pain. However, in many cases, more time must pass before a clear cause of the pain can be found. Before that point, your health care provider may not know if you need more testing or further treatment. HOME CARE INSTRUCTIONS Monitor your abdominal pain for any changes. The following actions may help to alleviate any discomfort you are experiencing:  Only take over-the-counter or prescription medicines as directed by your health care provider.  Do not take laxatives unless directed to do so by your health care provider.  Try a clear liquid diet (broth, tea, or water) as directed by your health care provider. Slowly move to a bland diet as tolerated. SEEK MEDICAL CARE IF:  You have unexplained abdominal pain.  You have abdominal pain associated with nausea or diarrhea.  You have pain when you urinate or have a bowel movement.  You experience abdominal pain that wakes you in the night.  You have abdominal pain that is worsened or improved by eating food.  You have abdominal pain that is worsened with eating fatty foods.  You have a fever. SEEK IMMEDIATE MEDICAL CARE IF:  Your pain does not go away within 2 hours.  You keep throwing up (vomiting).  Your pain is felt only in portions of the abdomen, such as the right side or the left lower  portion of the abdomen.  You pass bloody or black tarry stools. MAKE SURE YOU:  Understand these instructions.  Will watch your condition.  Will get help right away if you are not doing well or get worse.   This information is not intended to replace advice given to you by your health care provider. Make sure you discuss any questions you have with your health care provider.   Document Released: 02/25/2005 Document Revised: 02/06/2015 Document Reviewed: 01/25/2013 Elsevier Interactive Patient Education [employer].

## 2015-05-15 NOTE — ED Provider Notes (Signed)
CSN: [id number]     Arrival date & time 05/15/15  1719 History   First MD Initiated Contact with Patient 05/15/15 1839     Chief Complaint  Patient presents with  . Abdominal Pain    HPI   Alexander [name] is a 64 y.o. male with a PMH of CAD, HTN, HLD, paroxysmal atrial fibrillation on eliquis who presents to the ED with epigastric abdominal pain. He reports his symptoms started earlier today prior to arrival and lasted for approximately 15 minutes. He characterizes his pain as burning. He denies exacerbating or alleviating factors, though states he ate approximately 1 hour prior to the onset of his symptoms and tried pepto bismol in an attempt to alleviate his pain. He denies fever, though reports chills. He reports nausea. He denies emesis. He denies chest pain, shortness of breath, diarrhea, constipation, dysuria, urgency, frequency.   Past Medical History  Diagnosis Date  . Arthritis   . History of depression   . Headache(784.0)   . CAD (coronary artery disease)     a. 1995 s/p BMS;  b. 2000 s/p BMS;  c. 2005 s/p stent - All stents in [facility] to unknown vessels;  d. 04/2015 NSTEMI/Cath: LM nl, LAD 20ost/p, patent mLAD stent, RI small, OM2 patent stent, RCA 20p, patent stent, EF 45-50%-->Med Rx.  . Essential hypertension   . HLD (hyperlipidemia)   . Migraines   . Neuropathy ([place])   . Post-polio syndrome     a. ambulates with braces.  . Shoulder pain, right   . OSA (obstructive sleep apnea)     does not want to use CPAP, using mouth guard  . Chronic pain syndrome     a. Followed @ [facility];  b. Uses 12-14 excedrins per day.  . Ischemic cardiomyopathy     a. 04/2015 Echo: EF 40-45%, mod antsept, ant, antlat, apical HK, mild AI/MR, PASP 66mmHg.  Marland Kitchen PAF (paroxysmal atrial fibrillation) ([place])     a. 04/2015 in setting of cholecystits and sepsis -->Amio;  b. CHA2DS2VASc = 4--> Eliquis.   Past Surgical History  Procedure Laterality Date  . Uvulopalatopharyngoplasty,  tonsillectomy and septoplasty  2002  . Lumbar spine stimulator    . Cervical spine stimulator  08/2012  . Colonoscopy  09/2013    TA x2, rpt 5 yrs ([place])  . Coronary angioplasty with stent placement  1995, 2000, 2005  . Cardiac catheterization N/A 04/30/2015    Procedure: Left Heart Cath and Coronary Angiography;  Surgeon: Troy Sine, MD;  Location: [facility];  Service: Cardiovascular;  Laterality: N/A;   Family History  Problem Relation Age of Onset  . Diabetes Mother   . Cancer Sister 30    male cancer  . Coronary artery disease Neg Hx   . Stroke Neg Hx   . Colon cancer Neg Hx   . Pancreatic cancer Neg Hx   . Stomach cancer Neg Hx   . Cancer Brother 29    breast   Social History  Substance Use Topics  . Smoking status: Never Smoker   . Smokeless tobacco: Never Used  . Alcohol Use: No     Review of Systems  Constitutional: Positive for chills. Negative for fever.  Gastrointestinal: Positive for nausea and abdominal pain. Negative for vomiting, diarrhea, constipation and blood in stool.  Genitourinary: Negative for dysuria, urgency and frequency.  All other systems reviewed and are negative.     Allergies  Review of patient's allergies indicates no  known allergies.  Home Medications   Prior to Admission medications   Medication Sig Start Date End Date Taking? Authorizing Provider  amiodarone (PACERONE) 200 MG tablet Take 1 tablet (200 mg total) by mouth 2 (two) times daily. Patient taking differently: Take 200 mg by mouth daily.  05/02/15  Yes Thurnell Lose, MD  apixaban (ELIQUIS) 5 MG TABS tablet Take 1 tablet (5 mg total) by mouth 2 (two) times daily. 05/13/15  Yes Rogelia Mire, NP  aspirin 81 MG chewable tablet Chew 1 tablet (81 mg total) by mouth daily. 05/02/15  Yes Thurnell Lose, MD  atorvastatin (LIPITOR) 80 MG tablet Take 1 tablet (80 mg total) by mouth daily at 6 PM. 05/13/15  Yes Rogelia Mire, NP  Cholecalciferol ([employer]  VITAMIN D) 2000 UNITS CAPS Take 1 capsule (2,000 Units total) by mouth daily. 03/29/13  Yes [name], MD  gabapentin (NEURONTIN) 600 MG tablet TAKE 2 TABLETS THREE TIMES A DAY 03/11/15  Yes [name], MD  lisinopril (PRINIVIL,ZESTRIL) 2.5 MG tablet Take 1 tablet (2.5 mg total) by mouth daily. 05/13/15  Yes Rogelia Mire, NP  metoprolol tartrate (LOPRESSOR) 25 MG tablet Take 0.5 tablets (12.5 mg total) by mouth 2 (two) times daily. 05/13/15  Yes Rogelia Mire, NP  Multiple Vitamins-Minerals (MULTIVITAMIN ADULTS 50+ PO) Take 1 tablet by mouth daily.   Yes Historical Provider, MD  Omega-3 Fatty Acids (FISH OIL) 1000 MG CAPS Take 1 capsule by mouth daily.   Yes Historical Provider, MD  sildenafil (VIAGRA) 100 MG tablet Take 0.5-1 tablets (50-100 mg total) by mouth daily as needed for erectile dysfunction. 01/03/15  Yes [name], MD  Sodium Chloride Flush (NORMAL SALINE FLUSH) 0.9 % SOLN Inject 10 mLs into the vein every morning. 05/13/15  Yes Rogelia Mire, NP  vitamin C (ASCORBIC ACID) 500 MG tablet Take 1,000 mg by mouth daily.   Yes Historical Provider, MD  vitamin E 400 UNIT capsule Take 400 Units by mouth daily.   Yes Historical Provider, MD  ciprofloxacin (CIPRO) 500 MG tablet Take 1 tablet (500 mg total) by mouth every 12 (twelve) hours. 05/15/15   Marella Chimes, PA-C  methocarbamol (ROBAXIN) 500 MG tablet TAKE 1 TABLET THREE TIMES A DAY AS NEEDED FOR MUSCLE SPASM Patient not taking: Reported on 05/15/2015 10/04/14   [name], MD  omeprazole (PRILOSEC) 40 MG capsule TAKE 1 CAPSULE DAILY Patient not taking: Reported on 05/15/2015 11/26/14   [name], MD    BP 101/58 mmHg  Pulse 54  Temp(Src) 98.8 F (37.1 C) (Oral)  Resp 18  Ht 5\' 11"  (1.803 m)  Wt 79.833 kg  BMI 24.56 kg/m2  SpO2 98% Physical Exam  Constitutional: He is oriented to person, place, and time. He appears well-developed and well-nourished. No distress.  HENT:   Head: Normocephalic and atraumatic.  Right Ear: External ear normal.  Left Ear: External ear normal.  Nose: Nose normal.  Mouth/Throat: Uvula is midline, oropharynx is clear and moist and mucous membranes are normal.  Eyes: Conjunctivae, EOM and lids are normal. Pupils are equal, round, and reactive to light. Right eye exhibits no discharge. Left eye exhibits no discharge. No scleral icterus.  Neck: Normal range of motion. Neck supple.  Cardiovascular: Normal rate, regular rhythm, normal heart sounds, intact distal pulses and normal pulses.   Pulmonary/Chest: Effort normal and breath sounds normal. No respiratory distress. He has no wheezes. He has no rales.  Abdominal: Soft. Normal appearance and bowel  sounds are normal. He exhibits no distension and no mass. There is tenderness. There is no rigidity, no rebound and no guarding.  Tenderness to palpation in epigastrium and left upper quadrant. No rebound, guarding, or masses.  Musculoskeletal: Normal range of motion. He exhibits no edema or tenderness.  Neurological: He is alert and oriented to person, place, and time. He has normal strength. No sensory deficit.  Skin: Skin is warm, dry and intact. No rash noted. He is not diaphoretic. No erythema. No pallor.  Psychiatric: He has a normal mood and affect. His speech is normal and behavior is normal.  Nursing note and vitals reviewed.   ED Course  Procedures (including critical care time)  Labs Review Labs Reviewed  CBC WITH DIFFERENTIAL/PLATELET - Abnormal; Notable for the following:    WBC 14.4 (*)    RBC 3.90 (*)    Hemoglobin 12.3 (*)    HCT 37.1 (*)    Neutro Abs 10.5 (*)    All other components within normal limits  COMPREHENSIVE METABOLIC PANEL - Abnormal; Notable for the following:    Chloride 100 (*)    Glucose, Bld 146 (*)    Albumin 3.4 (*)    All other components within normal limits  URINALYSIS, ROUTINE W REFLEX MICROSCOPIC (NOT AT [facility]) - Abnormal; Notable for the  following:    APPearance CLOUDY (*)    All other components within normal limits  LIPASE, BLOOD  I-STAT CG4 LACTIC ACID, ED    Imaging Review Ct Abdomen Pelvis W Contrast  05/15/2015  CLINICAL DATA:  Upper abdominal pain today, post cholecystostomy tube placement, hypertension, ischemic cardiomyopathy, paroxysmal atrial fibrillation EXAM: CT ABDOMEN AND PELVIS WITH CONTRAST TECHNIQUE: Multidetector CT imaging of the abdomen and pelvis was performed using the standard protocol following bolus administration of intravenous contrast. Sagittal and coronal MPR images reconstructed from axial data set. CONTRAST:  163mL OMNIPAQUE IOHEXOL 300 MG/ML SOLN IV. Oral contrast not administered. COMPARISON:  04/25/2015 FINDINGS: Bibasilar chronic interstitial lung disease changes with improved bibasilar atelectasis. Cholecystostomy tube identified within gallbladder. Decreased gallbladder wall thickening and pericholecystic inflammation. Pneumobilia which may be seen post biliary drainage. Stable tiny LEFT renal cyst. Liver, spleen, pancreas, kidneys, and RIGHT adrenal gland otherwise normal. Stable LEFT adrenal nodule 19 x 16 mm. Unremarkable ureters and bladder. Prominent stool in rectum. Distal rectal wall thickening versus contraction, unable to exclude rectal tumor. Stomach and bowel loops otherwise normal appearance. Scattered atherosclerotic calcifications without aneurysm. No mass, adenopathy, free air, or free fluid. BILATERAL spondylolysis L5. Scattered degenerative disc disease changes thoracolumbar spine with significant spinal stenosis at L4-L5, less at L3-L4. IMPRESSION: Gallbladder is decompressed since prior exam with decreased wall thickening and pericholecystic infiltrative changes. Stable LEFT adrenal nodule. Severe spinal stenosis L4-L5. Unable to exclude rectal wall thickening or tumor though this could represent an artifact from underdistention; followup proctoscopy recommended. Electronically  Signed   By: Lavonia Dana M.D.   On: 05/15/2015 20:47   I have personally reviewed and evaluated these images and lab results as part of my medical decision-making.   EKG Interpretation None      MDM   Final diagnoses:  Abdominal pain  Abdominal pain    Per record review, patient was admitted November 24 with septic shock secondary to acute cholecystitis. He required pressors and broad spectrum antibiotics, and underwent percutaneous cholecystotomy tube placement on the 24th by IR. His hospital course was complicated by atrial fibrillation with RVR as well as NSTEMI. He was also found  to have HCAP 11/28. He underwent cardiac 11/29, which was remarkable for nonocclusive CAD. He was discharged 12/2 and is scheduled to follow up with surgery in January.  Patient presents today with abdominal pain, which he characterizes as burning. He states his symptoms started proximal than 1 hour after eating and lasted for 15 minutes. He reports associated chills and nausea.  Patient is afebrile. On exam, he has tenderness to palpation in epigastrium and left upper quadrant. No rebound, guarding, or masses.   Given fluids and zofran for nausea. CBC remarkable for leukocytosis of 14.4, hemoglobin 12.3. CMP unremarkable. Lactic acid within normal limits. Lipase within normal limits. UA negative for infection. Will obtain CT abdomen pelvis.  Imaging reveals gallbladder decompressed since prior exam with decreased wall thickening and pericholecystic infiltrative changes; stable left adrenal nodule; spinal stenosis at L4-L5; unable to exclude rectal wall thickening or tumor, this could represent artifact from underdistention, follow-up proctoscopy recommended.  Patient seen by and discussed with Dr. Lacinda Axon.  General surgery consulted for recommendation regarding acute management. Spoke with Dr. Hulen Skains, who advised patient needs antibiotic and outpatient follow-up. Advised to start patient on cipro.  Will give  1L NS bolus and reassess BP. Patient signed out to Okey Regal, PA-C at discharge. Pending improvement in BP, patient stable for discharge.  BP 101/58 mmHg  Pulse 54  Temp(Src) 98.8 F (37.1 C) (Oral)  Resp 18  Ht 5\' 11"  (1.803 m)  Wt 79.833 kg  BMI 24.56 kg/m2  SpO2 98%   Marella Chimes, PA-C 05/15/15 2134  Nat Christen, MD 05/15/15 2316

## 2015-05-15 NOTE — ED Provider Notes (Signed)
Alexander [name] is a 64 y.o. male who presents c/o epigastric abd pain.  Pt with recent sepsis from cholecystectomy; drain in place.  Onset severe pain approx 1 hour ago after eating. Pt reports chills, but no measured fever.  Nausea without vomiting.  Pt with Hx of a-fib.  Rate controlled, no anticoags.  No CP, SOB.    General: Awake, alert  HEENT: Atraumatic  Resp: Normal effort  Abd: Nondistended  MSK: Moves all extremities  Skin: Warm and dry, pale  BP 117/63 mmHg  Pulse 61  Temp(Src) 98.8 F (37.1 C) (Oral)  Resp 18  Ht 5\' 11"  (1.803 m)  Wt 79.833 kg  BMI 24.56 kg/m2  SpO2 99%  MSE was initiated and I personally evaluated the patient and placed orders (if any) at  5:26 PM on May 15, 2015.    The patient appears stable so that the remainder of the MSE may be completed by another provider.  Discussed with the patient that exiting the department prior to completion of the work-up is AMA and there is no guarantee that there are no emergency medical conditions present.     Jarrett Soho Hewitt Garner, PA-C 05/15/15 [place], MD 05/16/15 [phone]

## 2015-05-17 ENCOUNTER — Encounter: Payer: Self-pay | Admitting: Pulmonary Disease

## 2015-05-17 ENCOUNTER — Ambulatory Visit: Payer: Self-pay | Admitting: General Surgery

## 2015-05-17 ENCOUNTER — Ambulatory Visit (INDEPENDENT_AMBULATORY_CARE_PROVIDER_SITE_OTHER): Payer: Medicare Other | Admitting: Pulmonary Disease

## 2015-05-17 VITALS — BP 126/72 | HR 54 | Wt 174.0 lb

## 2015-05-17 DIAGNOSIS — R918 Other nonspecific abnormal finding of lung field: Secondary | ICD-10-CM | POA: Diagnosis not present

## 2015-05-17 DIAGNOSIS — I255 Ischemic cardiomyopathy: Secondary | ICD-10-CM

## 2015-05-17 DIAGNOSIS — K8020 Calculus of gallbladder without cholecystitis without obstruction: Secondary | ICD-10-CM | POA: Diagnosis not present

## 2015-05-17 NOTE — H&P (Signed)
History of Present Illness Geoffery Spruce MD; 05/17/2015 10:55 AM) Patient words: New-s/p perc drain/gallbladder.  The patient is a 64 year old male who presents for evaluation of gall stones. 64 year old male who is in the ER last month for abdominal pain found to have acute cholecystitis treated with percutaneous cholecystostomy tube. At same time he also had an STEMI. He has since followed up with cardiology as continue him on amiodarone as well as Rexene Agent for having A. fib episode of RVR in the hospital, which appears which appears resolved. He was in the ER once last week with abdominal pain and underwent imaging found to have no admission a gallbladder with tube in appropriate position. He since has not had any such issues. Family does note that a few days ago the drainage of the tube was more cloudy in nature and had to be flushed and irrigated more frequently. He denies vomiting but does have occasional nausea with certain foods. He denies fevers or chills. He is having bowel movements approximately once per day and they are lighter than prior to this attack.   Other Problems Malachi Bonds, CMA; 05/17/2015 9:48 AM) Arthritis Enlarged Prostate High blood pressure Hypercholesterolemia Other disease, cancer, significant illness  Past Surgical History Malachi Bonds, CMA; 05/17/2015 9:48 AM) Colon Polyp Removal - Colonoscopy  Diagnostic Studies History Malachi Bonds, CMA; 05/17/2015 9:48 AM) Colonoscopy 1-5 years ago  Allergies Malachi Bonds, CMA; 05/17/2015 9:48 AM) No Known Drug Allergies12/16/2016  Medication History Malachi Bonds, CMA; 05/17/2015 9:50 AM) Amiodarone HCl (200MG  Tablet, Oral) Active. Atorvastatin Calcium (80MG  Tablet, Oral) Active. Ciprofloxacin HCl (500MG  Tablet, Oral) Active. Eliquis (5MG  Tablet, Oral) Active. Lisinopril (2.5MG  Tablet, Oral) Active. Methocarbamol (500MG  Tablet, Oral) Active. Metoprolol Tartrate (25MG  Tablet,  Oral) Active. Viagra (100MG  Tablet, Oral) Active. Aspirin (81MG  Tablet, Oral) Active. Vitamin D (2000UNIT Capsule, Oral) Active. Gabapentin (600MG  Tablet, Oral) Active. Multivitamin Adult (Oral) Active. Fish Oil (1000MG  Capsule, Oral) Active. Omeprazole (40MG  Capsule DR, Oral) Active. Normal Saline Flush (0.9% Solution, Injection) Active. Vitamin C (500MG  Tablet, Oral) Active. Vitamin E (400UNIT Capsule, Oral) Active. Medications Reconciled  Social History Malachi Bonds, CMA; 05/17/2015 9:48 AM) Alcohol use Occasional alcohol use. Caffeine use Carbonated beverages, Coffee. No drug use Tobacco use Never smoker.  Family History Malachi Bonds, CMA; 05/17/2015 9:48 AM) Alcohol Abuse Brother, Sister. Arthritis Mother. Breast Cancer Brother, Sister. Diabetes Mellitus Mother. Heart Disease Brother. Hypertension Brother, Sister.    Review of Systems Malachi Bonds CMA; 05/17/2015 9:48 AM) General Present- Fatigue and Weight Loss. Not Present- Appetite Loss, Chills, Fever, Night Sweats and Weight Gain. Skin Not Present- Change in Wart/Mole, Dryness, Hives, Jaundice, New Lesions, Non-Healing Wounds, Rash and Ulcer. HEENT Present- Wears glasses/contact lenses. Not Present- Earache, Hearing Loss, Hoarseness, Nose Bleed, Oral Ulcers, Ringing in the Ears, Seasonal Allergies, Sinus Pain, Sore Throat, Visual Disturbances and Yellow Eyes. Respiratory Not Present- Bloody sputum, Chronic Cough, Difficulty Breathing, Snoring and Wheezing. Breast Not Present- Breast Mass, Breast Pain, Nipple Discharge and Skin Changes. Cardiovascular Not Present- Chest Pain, Difficulty Breathing Lying Down, Leg Cramps, Palpitations, Rapid Heart Rate, Shortness of Breath and Swelling of Extremities. Gastrointestinal Present- Change in Bowel Habits and Gets full quickly at meals. Not Present- Abdominal Pain, Bloating, Bloody Stool, Chronic diarrhea, Constipation, Difficulty Swallowing, Excessive  gas, Hemorrhoids, Indigestion, Nausea, Rectal Pain and Vomiting. Male Genitourinary Present- Frequency, Impotence, Nocturia and Urgency. Not Present- Blood in Urine, Change in Urinary Stream, Painful Urination and Urine Leakage. Musculoskeletal Present- Joint Stiffness, Muscle Pain and Muscle Weakness. Not Present-  Back Pain, Joint Pain and Swelling of Extremities. Neurological Present- Trouble walking and Weakness. Not Present- Decreased Memory, Fainting, Headaches, Numbness, Seizures, Tingling and Tremor. Psychiatric Not Present- Anxiety, Bipolar, Change in Sleep Pattern, Depression, Fearful and Frequent crying. Endocrine Present- Cold Intolerance. Not Present- Excessive Hunger, Hair Changes, Heat Intolerance, Hot flashes and New Diabetes. Hematology Not Present- Easy Bruising, Excessive bleeding, Gland problems, HIV and Persistent Infections.  Vitals (Chemira Jones CMA; 05/17/2015 9:47 AM) 05/17/2015 9:47 AM Weight: 174 lb Height: 71in Body Surface Area: 1.99 m Body Mass Index: 24.27 kg/m  Pulse: 62 (Regular)  BP: 106/68 (Sitting, Left Arm, Standard)       Physical Exam Geoffery Spruce MD; 05/17/2015 10:56 AM) General Mental Status-Alert. General Appearance-Cooperative. Orientation-Oriented X4. Posture-Normal posture.  Integumentary Global Assessment Normal Exam - Head/Face: no rashes, ulcers, lesions or evidence of photo damage. No palpable nodules or masses and Neck: no visible lesions or palpable masses.  Head and Neck Head-normocephalic, atraumatic with no lesions or palpable masses. Face Global Assessment - atraumatic. Thyroid Gland Characteristics - normal size and consistency.  Eye Eyeball - Bilateral-Extraocular movements intact. Sclera/Conjunctiva - Bilateral-No scleral icterus, No Discharge.  ENMT Nose and Sinuses Nose - no deformities observed, no swelling present.  Chest and Lung Exam Palpation Normal exam -  Non-tender. Auscultation Breath sounds - Normal.  Cardiovascular Auscultation Rhythm - Regular. Heart Sounds - S1 WNL and S2 WNL. Carotid arteries - No Carotid bruit.  Abdomen Inspection Normal Exam - No Visible peristalsis, No Abnormal pulsations and No Paradoxical movements. Palpation/Percussion Normal exam - Soft, No Rebound tenderness, No Rigidity (guarding), No hepatosplenomegaly and No Palpable abdominal masses. Tenderness - Right Upper Quadrant. Gallbladder - Negative Murphy's sign. Note: Right upper quadrant drain in place draining gold liquid with some sedimentation.   Peripheral Vascular Upper Extremity Palpation - Pulses bilaterally normal. Lower Extremity Palpation - Edema - Bilateral - No edema.  Neurologic Neurologic evaluation reveals -normal sensation and normal coordination.  Neuropsychiatric Mental status exam performed with findings of-able to articulate well with normal speech/language, rate, volume and coherence and thought content normal with ability to perform basic computations and apply abstract reasoning.  Musculoskeletal Normal Exam - Bilateral-Upper Extremity Strength Normal and Lower Extremity Strength Normal.    Assessment & Plan Geoffery Spruce MD; 05/17/2015 10:59 AM) GALL BLADDER STONES (K80.20) Impression: 64 year old male who had acute cholecystitis treated with cholecystostomy tube at time of NST EMI. Cardiology has cleared him for procedure however I would like to continue to wait and give at least 2 months since his heart strain for proceeding with procedure. I continue to recommend a low-fat diet to avoid issues of this pain. We discussed the procedure in detail including gallbladder and its physiologic functions performing laparoscopic cholecystectomy with removal of cholecystostomy tube with possible need for open procedure as well as intraoperative cholangiogram to assess the rest of the biliary tree. Discussed the risks of the  procedure as injury to liver, leak from where the drain is entering the liver, leak from the cystic duct clipped, injury to common bile duct, infection, injury to intestines or other abdominal structures. Patient and family should be understanding one to proceed. -I will schedule this at the end of January or beginning of February. -Plan for inpatient admission for monitoring -Plan for drain placement. Current Plans The anatomy & physiology of hepatobiliary & pancreatic function was discussed. The pathophysiology of gallbladder dysfunction was discussed. Natural history risks without surgery was discussed. I feel the risks of no  intervention will lead to serious problems that outweigh the operative risks; therefore, I recommended cholecystectomy to remove the pathology. I explained laparoscopic techniques with possible need for an open approach. Probable cholangiogram to evaluate the bilary tract was explained as well.  Risks such as bleeding, infection, abscess, leak, injury to other organs, need for further treatment, heart attack, death, and other risks were discussed. I noted a good likelihood this will help address the problem. Possibility that this will not correct all abdominal symptoms was explained. Goals of post-operative recovery were discussed as well. We will work to minimize complications. An educational handout further explaining the pathology and treatment options was given as well. Questions were answered. The patient expresses understanding & wishes to proceed with surgery.  Pt Education - Laparoscopic Cholecystectomy: gallbladder You are being scheduled for surgery - Our schedulers will call you.  You should hear from our office's scheduling department within 5 working days about the location, date, and time of surgery. We try to make accommodations for patient's preferences in scheduling surgery, but sometimes the OR schedule or the surgeon's schedule prevents [place] from making those  accommodations.  If you have not heard from our office [phone]) in 5 working days, call the office and ask for your surgeon's nurse.  If you have other questions about your diagnosis, plan, or surgery, call the office and ask for your surgeon's nurse.

## 2015-05-19 NOTE — Progress Notes (Signed)
PULMONARY OFFICE CONSULT NOTE  Requesting MD/Service: Ellyn Hack Date of consult: 05/17/15 Reason for consultation: Pulmonary assessment for amiodarone therapy  INITIAL HPI:  64 M recently hospitalized @ [facility] with severe sepsis/septic shock due to cholecystitis. He was admitted to ICU and required vasopressors. He underwent percutaneous drainage of his gall bladder with plans for a cholecystectomy in the near future. His hospitalization was complicated by [facility] which was treated with amiodarone. He has a PMH of CAD and his troponin I was elevated in the 2-3 range. An echocardiogram revealed LVEF of 40-45%. A cardiac catheterization revealed no critical stenosis with LVEF 45-50%. Since discharge, he has been to the ED (05/14/15) for an episode of severe abdominal pain. Repeat hospitalization was not necessary. He remains on amiodarone and is referred for pulmonary assessment. He has no prior pulmonary problems or diagnoses. He has never smoked. He has a history of post polio syndrome and his exertional limitations are due to the neuromuscular consequences of this illness. He denies DOE, CP, cough, hemoptysis, orthopnea, PND, LE edema, calf tenderness  Past Medical History  Diagnosis Date  . Arthritis   . History of depression   . Headache(784.0)   . CAD (coronary artery disease)     a. 1995 s/p BMS;  b. 2000 s/p BMS;  c. 2005 s/p stent - All stents in [facility] to unknown vessels;  d. 04/2015 NSTEMI/Cath: LM nl, LAD 20ost/p, patent mLAD stent, RI small, OM2 patent stent, RCA 20p, patent stent, EF 45-50%-->Med Rx.  . Essential hypertension   . HLD (hyperlipidemia)   . Migraines   . Neuropathy ([place])   . Post-polio syndrome     a. ambulates with braces.  . Shoulder pain, right   . OSA (obstructive sleep apnea)     does not want to use CPAP, using mouth guard  . Chronic pain syndrome     a. Followed @ [facility];  b. Uses 12-14 excedrins per day.  . Ischemic cardiomyopathy     a. 04/2015  Echo: EF 40-45%, mod antsept, ant, antlat, apical HK, mild AI/MR, PASP 45mmHg.  Marland Kitchen PAF (paroxysmal atrial fibrillation) ([place])     a. 04/2015 in setting of cholecystits and sepsis -->Amio;  b. CHA2DS2VASc = 4--> Eliquis.    Past Surgical History  Procedure Laterality Date  . Uvulopalatopharyngoplasty, tonsillectomy and septoplasty  2002  . Lumbar spine stimulator    . Cervical spine stimulator  08/2012  . Colonoscopy  09/2013    TA x2, rpt 5 yrs ([place])  . Coronary angioplasty with stent placement  1995, 2000, 2005  . Cardiac catheterization N/A 04/30/2015    Procedure: Left Heart Cath and Coronary Angiography;  Surgeon: Troy Sine, MD;  Location: [facility];  Service: Cardiovascular;  Laterality: N/A;    MEDICATIONS: I have reviewed all medications and confirmed regimen as documented. No pulmonary medications. Amiodarone 200 mg daily  Social History   Social History  . Marital Status: Married    Spouse Name: N/A  . Number of Children: N/A  . Years of Education: N/A   Occupational History  . Not on file.   Social History Main Topics  . Smoking status: Never Smoker   . Smokeless tobacco: Never Used  . Alcohol Use: No  . Drug Use: No  . Sexual Activity: Not on file   Other Topics Concern  . 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 Diabetes Mother   . Cancer Sister 52    male cancer  . Coronary artery disease Neg Hx   . Stroke Neg Hx   . Colon cancer Neg Hx   . Pancreatic cancer Neg Hx   . Stomach cancer Neg Hx   . Cancer Brother 60    breast    ROS: No fever, myalgias/arthralgias, unexplained weight loss or weight  gain No new focal weakness or sensory deficits No otalgia, hearing loss, visual changes, nasal and sinus symptoms, mouth and throat problems No neck pain or adenopathy No abdominal pain, N/V/D, diarrhea, change in bowel pattern No dysuria, change in urinary pattern No LE edema or calf tenderness   Filed Vitals:   05/17/15 1351  BP: 126/72  Pulse: 54  Weight: 174 lb (78.926 kg)  SpO2: 95%     EXAM:  Gen: WDWN, No overt respiratory distress HEENT: NCAT, TMs and canals normal, sclera white, nares and nasal mucosa normal, oropharynx normal Neck: Supple without LAN, thyromegaly, JVD Lungs: breath sounds full, percussion note normal throughout, No adventitious sounds Cardiovascular: Reg rhythm, rate normal, no murmurs noted Abdomen: Soft, nontender, normal BS, perc choly drain present Ext: without clubbing, cyanosis. LE braces prohibit assessment for edema Neuro: CNs grossly intact, sensory intact, DTRs not tested, chronic LE weakness noted Skin: Limited exam, no lesions noted  DATA:   BMP Latest Ref Rng 05/15/2015 05/10/2015 05/01/2015  Glucose 65 - 99 mg/dL 146(H) 94 130(H)  BUN 6 - 20 mg/dL 11 14 8   Creatinine 0.61 - 1.24 mg/dL 0.69 0.66 0.62  Sodium 135 - 145 mmol/L 135 135 135  Potassium 3.5 - 5.1 mmol/L 3.7 4.2 3.9  Chloride 101 - 111 mmol/L 100(L) 98 106  CO2 22 - 32 mmol/L 26 30 18(L)  Calcium 8.9 - 10.3 mg/dL 8.9 9.3 8.3(L)    CBC Latest Ref Rng 05/15/2015 05/10/2015 05/02/2015  WBC 4.0 - 10.5 K/uL 14.4(H) 10.8(H) 20.6(H)  Hemoglobin 13.0 - 17.0 g/dL 12.3(L) 13.2 13.0  Hematocrit 39.0 - 52.0 % 37.1(L) 39.6 38.1(L)  Platelets 150 - 400 K/uL 366 695.0(H) 479(H)    CXR (05/01/15): clearing pulmonary infiltrates  CT abdomen 05/15/15 reveals vague interstitial infiltrates in L>R bases that are partially cleared compared to prior study of 04/25/15    IMPRESSION:   Cardiomyopathy, recent AFRVR, amiodarone therapy Vague pulmonary infiltrates - seem to be resolving.  Likely represent resolving ALI changes. Not likely to represent a contra-indication to amiodarone therapy. Not likely to be a reason for postponing cholecystectomy   PLAN:  Cont current mgmt ROV 3-4 weeks with CXR and PFTs prior   Wilhelmina Mcardle, MD [facility], Critical [facility] Medicine

## 2015-05-21 ENCOUNTER — Other Ambulatory Visit: Payer: Self-pay | Admitting: Pulmonary Disease

## 2015-05-21 DIAGNOSIS — R918 Other nonspecific abnormal finding of lung field: Secondary | ICD-10-CM

## 2015-05-28 NOTE — Pre-Procedure Instructions (Signed)
    Rameen Gasparyan  05/28/2015      [id number] [place], [place] [phone] [facility] [place] [address] [address] Phone: [phone] Fax: [phone]  [facility] [address], [place] - [address] [place] [address] Phone: [phone] Fax: [place] [address] [place] [address] Phone: [phone] Fax: [phone]    Your procedure is scheduled on 06/04/14.  Report to [facility] Admitting at 630 A.M.  Call this number if you have problems the morning of surgery:  [phone]   Remember:  Do not eat food or drink liquids after midnight.  Take these medicines the morning of surgery with A SIP OF WATER --amidarone,neurontin,lopressor,levaquin,prilosec,robaxin   Do not wear jewelry, make-up or nail polish.  Do not wear lotions, powders, or perfumes.  You may wear deodorant.  Do not shave 48 hours prior to surgery.  Men may shave face and neck.  Do not bring valuables to the hospital.  [facility] is not responsible for any belongings or valuables.  Contacts, dentures or bridgework may not be worn into surgery.  Leave your suitcase in the car.  After surgery it may be brought to your room.  For patients admitted to the hospital, discharge time will be determined by your treatment team.  Patients discharged the day of surgery will not be allowed to drive home.   Name and phone number of your driver:  Special instructions:   Please read over the following fact sheets that you were given. Pain Booklet, Coughing and Deep Breathing and Surgical Site Infection Prevention

## 2015-05-29 ENCOUNTER — Inpatient Hospital Stay (HOSPITAL_COMMUNITY)
Admission: RE | Admit: 2015-05-29 | Discharge: 2015-05-29 | Disposition: A | Discharge status: Home / Self Care | Payer: Medicare Other | Source: Ambulatory Visit

## 2015-05-30 ENCOUNTER — Telehealth: Payer: Self-pay

## 2015-05-30 MED ORDER — LISINOPRIL 2.5 MG PO TABS: 2.5000 mg | ORAL_TABLET | Freq: Every day | ORAL | Status: DC

## 2015-05-30 MED ORDER — ATORVASTATIN CALCIUM 80 MG PO TABS: 80.0000 mg | ORAL_TABLET | Freq: Every day | ORAL | Status: DC

## 2015-05-30 MED ORDER — APIXABAN 5 MG PO TABS: 5.0000 mg | ORAL_TABLET | Freq: Two times a day (BID) | ORAL | Status: DC

## 2015-05-30 MED ORDER — METOPROLOL TARTRATE 25 MG PO TABS: 12.5000 mg | ORAL_TABLET | Freq: Two times a day (BID) | ORAL | Status: DC

## 2015-05-30 NOTE — Telephone Encounter (Signed)
Pt called stated that insurance changed and need RX send to a new pharmacy.  [place], Rx resent to preferred location

## 2015-06-04 DIAGNOSIS — K8020 Calculus of gallbladder without cholecystitis without obstruction: Secondary | ICD-10-CM | POA: Diagnosis not present

## 2015-06-06 DIAGNOSIS — K8020 Calculus of gallbladder without cholecystitis without obstruction: Secondary | ICD-10-CM | POA: Diagnosis not present

## 2015-06-07 ENCOUNTER — Encounter (HOSPITAL_COMMUNITY): Payer: Self-pay | Admitting: *Deleted

## 2015-06-07 DIAGNOSIS — K219 Gastro-esophageal reflux disease without esophagitis: Secondary | ICD-10-CM | POA: Diagnosis present

## 2015-06-07 DIAGNOSIS — N400 Enlarged prostate without lower urinary tract symptoms: Secondary | ICD-10-CM | POA: Diagnosis present

## 2015-06-07 DIAGNOSIS — I2510 Atherosclerotic heart disease of native coronary artery without angina pectoris: Secondary | ICD-10-CM | POA: Diagnosis present

## 2015-06-07 DIAGNOSIS — G14 Postpolio syndrome: Secondary | ICD-10-CM | POA: Diagnosis present

## 2015-06-07 DIAGNOSIS — G4733 Obstructive sleep apnea (adult) (pediatric): Secondary | ICD-10-CM | POA: Diagnosis present

## 2015-06-07 DIAGNOSIS — Z955 Presence of coronary angioplasty implant and graft: Secondary | ICD-10-CM

## 2015-06-07 DIAGNOSIS — Z8249 Family history of ischemic heart disease and other diseases of the circulatory system: Secondary | ICD-10-CM

## 2015-06-07 DIAGNOSIS — I10 Essential (primary) hypertension: Secondary | ICD-10-CM | POA: Diagnosis present

## 2015-06-07 DIAGNOSIS — I5022 Chronic systolic (congestive) heart failure: Secondary | ICD-10-CM | POA: Diagnosis present

## 2015-06-07 DIAGNOSIS — E7800 Pure hypercholesterolemia, unspecified: Secondary | ICD-10-CM | POA: Diagnosis present

## 2015-06-07 DIAGNOSIS — K810 Acute cholecystitis: Principal | ICD-10-CM | POA: Diagnosis present

## 2015-06-07 DIAGNOSIS — M1990 Unspecified osteoarthritis, unspecified site: Secondary | ICD-10-CM | POA: Diagnosis present

## 2015-06-07 DIAGNOSIS — I252 Old myocardial infarction: Secondary | ICD-10-CM

## 2015-06-07 DIAGNOSIS — I255 Ischemic cardiomyopathy: Secondary | ICD-10-CM | POA: Diagnosis present

## 2015-06-07 NOTE — Progress Notes (Signed)
Anesthesia Chart Review: SAME DAY WORK-UP.  Patient is a 66 year old male scheduled for laparoscopic cholecystectomy with removal of cholecystostomy drain, possible open procedure on 06/10/15 by Dr. Kieth Brightly.  History includes known CAD s/p stents '95 '00 '05 [facility]) with hospitalization 04/25/15 for severe sepsis/septic shock due to cholecystitis requiring percutaneous drain and vasopressors. Hospital course was complicated by afib with RVR 04/27/15 and NSTEMI. LHC showed no critical stenosis with patent stents. He was discharged home on 05/02/15 with plans for future cholecystectomy after 6 weeks (cardiology aware). Other history includes chronic systolic CHF, ischemic cardiomyopathy, GERD, HTN, poliomyelitis with postpolio syndrome (muscle atrophy in BLE), OSA (s/p UPPP, no CPAP, using mouth guard), non-smoker, lumbar and cervical spine stimulator, migraines.  PCP is Dr. [name]. Cardiologist is Dr. Ellyn Hack [facility] office). Last visit with Murray Hodgkins, NP on 12/12/6. No further ischemic testing recommended prior to cholecystectomy, but he should remain on b-blocker and statin therapy throughout the perioperatively period. He was given permission to hold Eliquis for 3 days prior to surgery and will not require bridging. Close attention to his volume status and IVF recommended in the perioperative period. He was in SR at that time. Pulmonologist is Dr. Merton Border, last visit 05/19/15. Seen for pulmonary assessment on amiodarone. He did not feel surgery needed to be postponed further from a pulmonary standpoint.  Meds include amiodarone, Eliquis, ASA 81mg , Lipitor, Neurontin, lisinopril, Robaxin, Lopressor, fish oil, Prilosec, Viagra.   05/13/15 EKG: SB at 51 bpm.  04/30/15 LHC (Dr. Claiborne Billings):  Prox RCA-1 lesion, 20% stenosed.  Ost LAD to Prox LAD lesion, 20% stenosed.  There is mild left ventricular systolic dysfunction. Mild LV dysfunction with very subtle minimal mid  anterolateral hypocontractility with a global ejection fraction of 45-50%. No significant residual CAD with 10-20% luminal irregularity of the proximal LAD, and evidence for widely patent LAD stent after this region of irregularity; dominant left circumflex coronary artery with a widely patent stent in the marginal branch; nondominant RCA with widely pain proximal stent with smooth 20% narrowing immediately proximal to the stented segment. Recommendations: The present study did not demonstrate significant residual CAD, and all stents are widely patent. Heparin will be resumed 8 hours post sheath removal in light of his atrial fibrillation and troponin elevation of 5.  04/28/15 Echo: Study Conclusions - Left ventricle: The cavity size was normal. Systolic function was mildly to moderately reduced. The estimated ejection fraction was in the range of 40% to 45%. Probable moderate hypokinesis of the anteroseptal, anterior, anterolateral, and apical myocardium. Left ventricular diastolic function parameters were normal. - Aortic valve: There was mild regurgitation. - Mitral valve: There was mild regurgitation. - Pulmonary arteries: PA peak pressure: 31 mm Hg (S).  05/01/15 PCXR: FINDINGS: STIMULATING LEADS ARE SUPERIMPOSED ON THE SPINE. PARTIAL CLEARANCE OF STREAKY LEFT UPPER LOBE OPACITY. THE RIGHT LUNG IS CLEAR. NO LARGE EFFUSIONS. NO PNEUMOTHORAX. HILAR AND MEDIASTINAL CONTOURS ARE UNREMARKABLE AND UNCHANGED. IMPRESSION: PARTIAL CLEARANCE OF LEFT UPPER LOBE OPACITIES.  (At his 05/19/15 pulmonology visit, Dr. Alva Garnet wrote, "Vague pulmonary infiltrates - seem to be resolving. Likely represent resolving ALI changes. Not likely to represent a contra-indication to amiodarone therapy. Not likely to be a reason for postponing cholecystectomy.")  He is 6 weeks out from his NSTEMI in the setting of rapid afib and sepsis. Medical therapy recommended after cath. Cardiology is on board with plans for  cholecystectomy. He will need labs on arrival. Would not plan on repeating his CXR unless acute pulmonary  symptoms/findings. Defer additional orders to surgeon and/or anesthesiologist.   Myra Gianotti, PA-C [facility] Short Stay Center/Anesthesiology Phone [phone] 06/07/2015 2:23 PM

## 2015-06-07 NOTE — Progress Notes (Signed)
Pt has a long cardiac history with stents placed 1995, 2000 and 2005. Most recently had NSTEMI in November, 2016 and diagnosed with A-fib. Cardiologist is Dr. Ellyn Hack. Pt has cardiac clearance in [facility] 05/13/15. Pt was instructed to stop Eliquis and Aspirin 3 days prior to surgery. He states he has taken his doses today, but will not take anymore until after surgery. Pt denies any recent chest pain or sob. States he feels fatigued due to not being able to get out and do things because of gallbladder pain.  Instructed pt to stop his Multivitamins, Vitamin E and Fish Oil as of today.   Echo 04/28/15 - in EPIC Cath 04/30/15 - in EPIC EKG 05/13/15 - in [facility]

## 2015-06-09 MED ORDER — CEFAZOLIN SODIUM-DEXTROSE 2-3 GM-% IV SOLR
2.0000 g | INTRAVENOUS | Status: DC
  Filled 2015-06-09: qty 50

## 2015-06-09 MED ORDER — CHLORHEXIDINE GLUCONATE 4 % EX LIQD: 1.0000 "application " | Freq: Once | CUTANEOUS | Status: DC

## 2015-06-09 MED ORDER — HEPARIN SODIUM (PORCINE) 5000 UNIT/ML IJ SOLN
5000.0000 [IU] | Freq: Once | INTRAMUSCULAR | Status: AC
  Administered 2015-06-10: 5000 [IU] via SUBCUTANEOUS
  Filled 2015-06-09: qty 1

## 2015-06-10 ENCOUNTER — Inpatient Hospital Stay (HOSPITAL_COMMUNITY): Payer: Medicare Other | Admitting: Vascular Surgery

## 2015-06-10 ENCOUNTER — Inpatient Hospital Stay (HOSPITAL_COMMUNITY)
Admission: RE | Admit: 2015-06-10 | Discharge: 2015-06-11 | DRG: 418 | Disposition: A | Discharge status: Home / Self Care | Payer: Medicare Other | Source: Ambulatory Visit | Attending: General Surgery | Admitting: General Surgery

## 2015-06-10 ENCOUNTER — Encounter (HOSPITAL_COMMUNITY): Payer: Self-pay

## 2015-06-10 ENCOUNTER — Encounter (HOSPITAL_COMMUNITY)
Admission: RE | Disposition: A | Discharge status: Home / Self Care | Payer: Self-pay | Source: Ambulatory Visit | Attending: General Surgery

## 2015-06-10 DIAGNOSIS — I255 Ischemic cardiomyopathy: Secondary | ICD-10-CM | POA: Diagnosis present

## 2015-06-10 DIAGNOSIS — K819 Cholecystitis, unspecified: Secondary | ICD-10-CM | POA: Diagnosis not present

## 2015-06-10 DIAGNOSIS — G14 Postpolio syndrome: Secondary | ICD-10-CM | POA: Diagnosis present

## 2015-06-10 DIAGNOSIS — I10 Essential (primary) hypertension: Secondary | ICD-10-CM | POA: Diagnosis present

## 2015-06-10 DIAGNOSIS — R1011 Right upper quadrant pain: Secondary | ICD-10-CM | POA: Diagnosis not present

## 2015-06-10 DIAGNOSIS — K812 Acute cholecystitis with chronic cholecystitis: Secondary | ICD-10-CM | POA: Diagnosis not present

## 2015-06-10 DIAGNOSIS — G4733 Obstructive sleep apnea (adult) (pediatric): Secondary | ICD-10-CM | POA: Diagnosis present

## 2015-06-10 DIAGNOSIS — I5022 Chronic systolic (congestive) heart failure: Secondary | ICD-10-CM | POA: Diagnosis present

## 2015-06-10 DIAGNOSIS — Z955 Presence of coronary angioplasty implant and graft: Secondary | ICD-10-CM | POA: Diagnosis not present

## 2015-06-10 DIAGNOSIS — M1990 Unspecified osteoarthritis, unspecified site: Secondary | ICD-10-CM | POA: Diagnosis present

## 2015-06-10 DIAGNOSIS — I2510 Atherosclerotic heart disease of native coronary artery without angina pectoris: Secondary | ICD-10-CM | POA: Diagnosis present

## 2015-06-10 DIAGNOSIS — I480 Paroxysmal atrial fibrillation: Secondary | ICD-10-CM | POA: Diagnosis not present

## 2015-06-10 DIAGNOSIS — K810 Acute cholecystitis: Secondary | ICD-10-CM | POA: Diagnosis present

## 2015-06-10 DIAGNOSIS — E7800 Pure hypercholesterolemia, unspecified: Secondary | ICD-10-CM | POA: Diagnosis present

## 2015-06-10 DIAGNOSIS — Z01818 Encounter for other preprocedural examination: Secondary | ICD-10-CM

## 2015-06-10 DIAGNOSIS — K219 Gastro-esophageal reflux disease without esophagitis: Secondary | ICD-10-CM | POA: Diagnosis present

## 2015-06-10 DIAGNOSIS — N400 Enlarged prostate without lower urinary tract symptoms: Secondary | ICD-10-CM | POA: Diagnosis present

## 2015-06-10 DIAGNOSIS — I252 Old myocardial infarction: Secondary | ICD-10-CM | POA: Diagnosis not present

## 2015-06-10 DIAGNOSIS — Z8249 Family history of ischemic heart disease and other diseases of the circulatory system: Secondary | ICD-10-CM | POA: Diagnosis not present

## 2015-06-10 HISTORY — PX: CHOLECYSTECTOMY: SHX55

## 2015-06-10 HISTORY — DX: Myoneural disorder, unspecified: G70.9

## 2015-06-10 HISTORY — DX: Pneumonia, unspecified organism: J18.9

## 2015-06-10 LAB — CBC WITH DIFFERENTIAL/PLATELET
BASOS ABS: 0.1 10*3/uL (ref 0.0–0.1)
BASOS PCT: 1 %
Eosinophils Absolute: 0.2 10*3/uL (ref 0.0–0.7)
Eosinophils Relative: 3 %
HEMATOCRIT: 39 % (ref 39.0–52.0)
Hemoglobin: 13.1 g/dL (ref 13.0–17.0)
Lymphocytes Relative: 26 %
Lymphs Abs: 2.1 10*3/uL (ref 0.7–4.0)
MCH: 32 pg (ref 26.0–34.0)
MCHC: 33.6 g/dL (ref 30.0–36.0)
MCV: 95.4 fL (ref 78.0–100.0)
MONO ABS: 0.5 10*3/uL (ref 0.1–1.0)
Monocytes Relative: 5 %
NEUTROS ABS: 5.4 10*3/uL (ref 1.7–7.7)
Neutrophils Relative %: 65 %
PLATELETS: 269 10*3/uL (ref 150–400)
RBC: 4.09 MIL/uL — AB (ref 4.22–5.81)
RDW: 13.7 % (ref 11.5–15.5)
WBC: 8.3 10*3/uL (ref 4.0–10.5)

## 2015-06-10 LAB — COMPREHENSIVE METABOLIC PANEL
ALBUMIN: 3.7 g/dL (ref 3.5–5.0)
ALT: 28 U/L (ref 17–63)
AST: 22 U/L (ref 15–41)
Alkaline Phosphatase: 73 U/L (ref 38–126)
Anion gap: 10 (ref 5–15)
BILIRUBIN TOTAL: 0.7 mg/dL (ref 0.3–1.2)
BUN: 10 mg/dL (ref 6–20)
CHLORIDE: 102 mmol/L (ref 101–111)
CO2: 27 mmol/L (ref 22–32)
Calcium: 9.2 mg/dL (ref 8.9–10.3)
Creatinine, Ser: 0.57 mg/dL — ABNORMAL LOW (ref 0.61–1.24)
GFR calc Af Amer: >60 mL/min (ref >60)
GFR calc non Af Amer: >60 mL/min (ref >60)
GLUCOSE: 94 mg/dL (ref 65–99)
POTASSIUM: 4.1 mmol/L (ref 3.5–5.1)
Sodium: 139 mmol/L (ref 135–145)
Total Protein: 7.1 g/dL (ref 6.5–8.1)

## 2015-06-10 LAB — PROTIME-INR
INR: 1.12 (ref 0.00–1.49)
Prothrombin Time: 14.5 seconds (ref 11.6–15.2)

## 2015-06-10 SURGERY — LAPAROSCOPIC CHOLECYSTECTOMY
Anesthesia: General

## 2015-06-10 MED ORDER — ROCURONIUM BROMIDE 100 MG/10ML IV SOLN
INTRAVENOUS | Status: DC | PRN
  Administered 2015-06-10: 10 mg via INTRAVENOUS
  Administered 2015-06-10: 40 mg via INTRAVENOUS

## 2015-06-10 MED ORDER — PROPOFOL 10 MG/ML IV BOLUS
INTRAVENOUS | Status: DC | PRN
  Administered 2015-06-10 (×3): 20 mg via INTRAVENOUS
  Administered 2015-06-10: 180 mg via INTRAVENOUS

## 2015-06-10 MED ORDER — LIDOCAINE HCL (CARDIAC) 20 MG/ML IV SOLN
INTRAVENOUS | Status: DC | PRN
  Administered 2015-06-10: 50 mg via INTRATRACHEAL
  Administered 2015-06-10: 50 mg via INTRAVENOUS

## 2015-06-10 MED ORDER — VITAMIN C 500 MG PO TABS: 1000.0000 mg | ORAL_TABLET | Freq: Every day | ORAL | Status: DC

## 2015-06-10 MED ORDER — SODIUM CHLORIDE 0.9 % IV SOLN
INTRAVENOUS | Status: DC | PRN
  Administered 2015-06-10: 14:00:00

## 2015-06-10 MED ORDER — LIDOCAINE HCL (CARDIAC) 20 MG/ML IV SOLN
INTRAVENOUS | Status: AC
  Filled 2015-06-10: qty 5

## 2015-06-10 MED ORDER — EPHEDRINE SULFATE 50 MG/ML IJ SOLN
INTRAMUSCULAR | Status: AC
  Filled 2015-06-10: qty 1

## 2015-06-10 MED ORDER — PROMETHAZINE HCL 25 MG/ML IJ SOLN: 6.2500 mg | INTRAMUSCULAR | Status: DC | PRN

## 2015-06-10 MED ORDER — ROCURONIUM BROMIDE 50 MG/5ML IV SOLN
INTRAVENOUS | Status: AC
  Filled 2015-06-10: qty 1

## 2015-06-10 MED ORDER — ONDANSETRON HCL 4 MG/2ML IJ SOLN: 4.0000 mg | Freq: Four times a day (QID) | INTRAMUSCULAR | Status: DC | PRN

## 2015-06-10 MED ORDER — PROPOFOL 10 MG/ML IV BOLUS
INTRAVENOUS | Status: AC
  Filled 2015-06-10: qty 20

## 2015-06-10 MED ORDER — MEPERIDINE HCL 25 MG/ML IJ SOLN: 6.2500 mg | INTRAMUSCULAR | Status: DC | PRN

## 2015-06-10 MED ORDER — ARTIFICIAL TEARS OP OINT
TOPICAL_OINTMENT | OPHTHALMIC | Status: AC
  Filled 2015-06-10: qty 3.5

## 2015-06-10 MED ORDER — KCL IN DEXTROSE-NACL 20-5-0.45 MEQ/L-%-% IV SOLN
INTRAVENOUS | Status: DC
Start: 2015-06-10 — End: 2015-06-11
  Administered 2015-06-10: 18:00:00 via INTRAVENOUS
  Filled 2015-06-10 (×2): qty 1000

## 2015-06-10 MED ORDER — ONDANSETRON 4 MG PO TBDP: 4.0000 mg | ORAL_TABLET | Freq: Four times a day (QID) | ORAL | Status: DC | PRN

## 2015-06-10 MED ORDER — BUPIVACAINE-EPINEPHRINE 0.25% -1:200000 IJ SOLN
INTRAMUSCULAR | Status: DC | PRN
  Administered 2015-06-10: 30 mL

## 2015-06-10 MED ORDER — ATORVASTATIN CALCIUM 80 MG PO TABS
80.0000 mg | ORAL_TABLET | Freq: Every day | ORAL | Status: DC
  Administered 2015-06-10: 80 mg via ORAL
  Filled 2015-06-10: qty 1

## 2015-06-10 MED ORDER — PHENYLEPHRINE HCL 10 MG/ML IJ SOLN
10.0000 mg | INTRAVENOUS | Status: DC | PRN
  Administered 2015-06-10: 40 ug/min via INTRAVENOUS

## 2015-06-10 MED ORDER — ACETAMINOPHEN 500 MG PO TABS
1000.0000 mg | ORAL_TABLET | Freq: Four times a day (QID) | ORAL | Status: DC
  Administered 2015-06-10 – 2015-06-11 (×3): 1000 mg via ORAL
  Filled 2015-06-10 (×2): qty 2

## 2015-06-10 MED ORDER — SODIUM CHLORIDE 0.9 % IR SOLN
Status: DC | PRN
  Administered 2015-06-10: 1000 mL

## 2015-06-10 MED ORDER — 0.9 % SODIUM CHLORIDE (POUR BTL) OPTIME
TOPICAL | Status: DC | PRN
Start: 2015-06-10 — End: 2015-06-10
  Administered 2015-06-10: 1000 mL

## 2015-06-10 MED ORDER — PHENYLEPHRINE HCL 10 MG/ML IJ SOLN
INTRAMUSCULAR | Status: DC | PRN
  Administered 2015-06-10: 40 ug via INTRAVENOUS
  Administered 2015-06-10: 80 ug via INTRAVENOUS

## 2015-06-10 MED ORDER — MORPHINE SULFATE (PF) 2 MG/ML IV SOLN
2.0000 mg | INTRAVENOUS | Status: DC | PRN
  Administered 2015-06-10: 2 mg via INTRAVENOUS
  Filled 2015-06-10: qty 1

## 2015-06-10 MED ORDER — STERILE WATER FOR INJECTION IJ SOLN
INTRAMUSCULAR | Status: AC
  Filled 2015-06-10: qty 10

## 2015-06-10 MED ORDER — ONDANSETRON HCL 4 MG/2ML IJ SOLN
INTRAMUSCULAR | Status: AC
  Filled 2015-06-10: qty 2

## 2015-06-10 MED ORDER — GLYCOPYRROLATE 0.2 MG/ML IJ SOLN
INTRAMUSCULAR | Status: AC
Start: 2015-06-10 — End: 2015-06-10
  Filled 2015-06-10: qty 2

## 2015-06-10 MED ORDER — MIDAZOLAM HCL 2 MG/2ML IJ SOLN
INTRAMUSCULAR | Status: AC
  Filled 2015-06-10: qty 2

## 2015-06-10 MED ORDER — NEOSTIGMINE METHYLSULFATE 10 MG/10ML IV SOLN
INTRAVENOUS | Status: AC
  Filled 2015-06-10: qty 1

## 2015-06-10 MED ORDER — METHOCARBAMOL 750 MG PO TABS: 750.0000 mg | ORAL_TABLET | Freq: Four times a day (QID) | ORAL | Status: DC | PRN

## 2015-06-10 MED ORDER — METOPROLOL TARTRATE 12.5 MG HALF TABLET
12.5000 mg | ORAL_TABLET | Freq: Two times a day (BID) | ORAL | Status: DC
  Administered 2015-06-10: 12.5 mg via ORAL
  Filled 2015-06-10: qty 1

## 2015-06-10 MED ORDER — PANTOPRAZOLE SODIUM 40 MG PO TBEC: 40.0000 mg | DELAYED_RELEASE_TABLET | Freq: Every day | ORAL | Status: DC

## 2015-06-10 MED ORDER — HEPARIN SODIUM (PORCINE) 5000 UNIT/ML IJ SOLN
5000.0000 [IU] | Freq: Three times a day (TID) | INTRAMUSCULAR | Status: DC
  Administered 2015-06-10 – 2015-06-11 (×2): 5000 [IU] via SUBCUTANEOUS
  Filled 2015-06-10 (×2): qty 1

## 2015-06-10 MED ORDER — GLYCOPYRROLATE 0.2 MG/ML IJ SOLN
INTRAMUSCULAR | Status: DC | PRN
  Administered 2015-06-10: 0.2 mg via INTRAVENOUS
  Administered 2015-06-10: .6 mg via INTRAVENOUS
  Administered 2015-06-10: 0.2 mg via INTRAVENOUS

## 2015-06-10 MED ORDER — MIDAZOLAM HCL 5 MG/5ML IJ SOLN
INTRAMUSCULAR | Status: DC | PRN
  Administered 2015-06-10: 2 mg via INTRAVENOUS

## 2015-06-10 MED ORDER — OXYCODONE HCL 5 MG PO TABS
5.0000 mg | ORAL_TABLET | ORAL | Status: DC | PRN
  Administered 2015-06-10 (×2): 10 mg via ORAL
  Filled 2015-06-10 (×2): qty 2

## 2015-06-10 MED ORDER — NEOSTIGMINE METHYLSULFATE 10 MG/10ML IV SOLN
INTRAVENOUS | Status: DC | PRN
  Administered 2015-06-10: 4 mg via INTRAVENOUS

## 2015-06-10 MED ORDER — LACTATED RINGERS IV SOLN
INTRAVENOUS | Status: DC
  Administered 2015-06-10 (×2): via INTRAVENOUS

## 2015-06-10 MED ORDER — SIMETHICONE 80 MG PO CHEW: 40.0000 mg | CHEWABLE_TABLET | Freq: Four times a day (QID) | ORAL | Status: DC | PRN

## 2015-06-10 MED ORDER — FENTANYL CITRATE (PF) 100 MCG/2ML IJ SOLN
INTRAMUSCULAR | Status: DC | PRN
  Administered 2015-06-10: 100 ug via INTRAVENOUS
  Administered 2015-06-10: 50 ug via INTRAVENOUS

## 2015-06-10 MED ORDER — BUPIVACAINE-EPINEPHRINE (PF) 0.25% -1:200000 IJ SOLN
INTRAMUSCULAR | Status: AC
  Filled 2015-06-10: qty 30

## 2015-06-10 MED ORDER — LISINOPRIL 5 MG PO TABS: 2.5000 mg | ORAL_TABLET | Freq: Every day | ORAL | Status: DC

## 2015-06-10 MED ORDER — HYDROMORPHONE HCL 1 MG/ML IJ SOLN
INTRAMUSCULAR | Status: AC
  Filled 2015-06-10: qty 1

## 2015-06-10 MED ORDER — FENTANYL CITRATE (PF) 250 MCG/5ML IJ SOLN
INTRAMUSCULAR | Status: AC
  Filled 2015-06-10: qty 5

## 2015-06-10 MED ORDER — HYDROMORPHONE HCL 1 MG/ML IJ SOLN
0.2500 mg | INTRAMUSCULAR | Status: DC | PRN
  Administered 2015-06-10: 0.5 mg via INTRAVENOUS

## 2015-06-10 MED ORDER — ONDANSETRON HCL 4 MG/2ML IJ SOLN
INTRAMUSCULAR | Status: DC | PRN
  Administered 2015-06-10: 4 mg via INTRAVENOUS

## 2015-06-10 MED ORDER — CEFAZOLIN SODIUM-DEXTROSE 2-3 GM-% IV SOLR
INTRAVENOUS | Status: DC | PRN
  Administered 2015-06-10: 2 g via INTRAVENOUS

## 2015-06-10 MED ORDER — GABAPENTIN 600 MG PO TABS
600.0000 mg | ORAL_TABLET | Freq: Three times a day (TID) | ORAL | Status: DC
  Administered 2015-06-10: 600 mg via ORAL
  Filled 2015-06-10: qty 1

## 2015-06-10 SURGICAL SUPPLY — 44 items
APPLIER CLIP ROT 10 11.4 M/L (STAPLE) ×3
BANDAGE ADH SHEER 1  50/CT (GAUZE/BANDAGES/DRESSINGS) ×12 IMPLANT
BENZOIN TINCTURE PRP APPL 2/3 (GAUZE/BANDAGES/DRESSINGS) ×3 IMPLANT
BLADE SURG ROTATE 9660 (MISCELLANEOUS) ×3 IMPLANT
CANISTER SUCTION 2500CC (MISCELLANEOUS) ×3 IMPLANT
CHLORAPREP W/TINT 26ML (MISCELLANEOUS) ×3 IMPLANT
CLIP APPLIE ROT 10 11.4 M/L (STAPLE) ×2 IMPLANT
COVER MAYO STAND STRL (DRAPES) ×3 IMPLANT
COVER SURGICAL LIGHT HANDLE (MISCELLANEOUS) ×3 IMPLANT
DECANTER SPIKE VIAL GLASS SM (MISCELLANEOUS) ×3 IMPLANT
DEVICE TROCAR PUNCTURE CLOSURE (ENDOMECHANICALS) ×3 IMPLANT
DRAPE C-ARM 42X72 X-RAY (DRAPES) ×3 IMPLANT
ELECT REM PT RETURN 9FT ADLT (ELECTROSURGICAL) ×3
ELECTRODE REM PT RTRN 9FT ADLT (ELECTROSURGICAL) ×2 IMPLANT
ENDOLOOP SUT PDS II  0 18 (SUTURE) ×1
ENDOLOOP SUT PDS II 0 18 (SUTURE) ×2 IMPLANT
GLOVE BIOGEL M STRL SZ7.5 (GLOVE) ×3 IMPLANT
GLOVE BIOGEL PI IND STRL 7.0 (GLOVE) ×8 IMPLANT
GLOVE BIOGEL PI IND STRL 8 (GLOVE) ×2 IMPLANT
GLOVE BIOGEL PI INDICATOR 7.0 (GLOVE) ×4
GLOVE BIOGEL PI INDICATOR 8 (GLOVE) ×1
GLOVE SURG SS PI 6.5 STRL IVOR (GLOVE) ×3 IMPLANT
GLOVE SURG SS PI 7.0 STRL IVOR (GLOVE) ×9 IMPLANT
GOWN STRL REUS W/ TWL LRG LVL3 (GOWN DISPOSABLE) ×8 IMPLANT
GOWN STRL REUS W/TWL LRG LVL3 (GOWN DISPOSABLE) ×4
KIT BASIN OR (CUSTOM PROCEDURE TRAY) ×3 IMPLANT
KIT ROOM TURNOVER OR (KITS) ×3 IMPLANT
NS IRRIG 1000ML POUR BTL (IV SOLUTION) ×3 IMPLANT
PAD ARMBOARD 7.5X6 YLW CONV (MISCELLANEOUS) ×6 IMPLANT
POUCH RETRIEVAL ECOSAC 10 (ENDOMECHANICALS) ×2 IMPLANT
POUCH RETRIEVAL ECOSAC 10MM (ENDOMECHANICALS) ×1
SCISSORS LAP 5X35 DISP (ENDOMECHANICALS) ×3 IMPLANT
SET CHOLANGIOGRAPH 5 50 .035 (SET/KITS/TRAYS/PACK) ×3 IMPLANT
SET IRRIG TUBING LAPAROSCOPIC (IRRIGATION / IRRIGATOR) ×3 IMPLANT
SLEEVE ENDOPATH XCEL 5M (ENDOMECHANICALS) ×6 IMPLANT
SPECIMEN JAR SMALL (MISCELLANEOUS) ×3 IMPLANT
STRIP CLOSURE SKIN 1/2X4 (GAUZE/BANDAGES/DRESSINGS) ×3 IMPLANT
SUT MNCRL AB 4-0 PS2 18 (SUTURE) ×3 IMPLANT
TOWEL OR 17X24 6PK STRL BLUE (TOWEL DISPOSABLE) IMPLANT
TOWEL OR 17X26 10 PK STRL BLUE (TOWEL DISPOSABLE) ×3 IMPLANT
TRAY LAPAROSCOPIC MC (CUSTOM PROCEDURE TRAY) ×3 IMPLANT
TROCAR XCEL 12X100 BLDLESS (ENDOMECHANICALS) ×3 IMPLANT
TROCAR XCEL NON-BLD 5MMX100MML (ENDOMECHANICALS) ×3 IMPLANT
TUBING INSUFFLATION (TUBING) ×3 IMPLANT

## 2015-06-10 NOTE — Op Note (Signed)
Preoperative diagnosis: acute cholecystitis  Postoperative diagnosis: Same   Procedure: laparoscopic cholecystectomy  Surgeon: Gurney Maxin, M.D.  Asst: Greer Pickerel, M.D.  Anesthesia: Gen.   Indications for procedure: Alexander [name] is a 65 y.o. male with symptoms of Abdominal pain and RUQ pain consistent with gallbladder disease, Confirmed by Ultrasound and CT. He initially was treated with cholecystostomy tube because of septic shock requiring pressors and heart strain with elevated troponins. He did recover and now presents for removal of gallbladder  Description of procedure: The patient was brought into the operative suite, placed supine. Anesthesia was administered with endotracheal tube. Patient was strapped in place and foot board was secured. All pressure points were offloaded by foam padding. The patient was prepped and draped in the usual sterile fashion.  A small incision was made to the right of the umbilicus. A 55mm trocar was inserted into the peritoneal cavity with optical entry. Pneumoperitoneum was applied with high flow low pressure. 2 69mm trocars were placed in the RUQ. A 44mm trocar was placed in the subxiphoid space. All trocars sites were first anesthesized with 0.25% marcaine with epinephrine in the subcutaneous and preperitoneal layers. Next the patient was placed in reverse trendelenberg. The omentum was adhered to the gallbladder and cholecystostomy tube. The omentum was dissected free with cautery. The gallbladder was retracted cephalad and lateral. The peritoneum was reflected off the infundibulum working lateral to medial.   A cystic node area was identified and dissected and a vessel was ligated. Further dissection was able to create a window below the gallbladder. It appeared the common bile duct was very near the inferior edge of gallbladder with a 1-2cm cystic duct. The cystic duct was then cut and a 0 PDS endoloop was placed around the cystic duct stump.  Dissecting the gallbladder off the liver bed led to entering the gallbladder with identification of the cholecystostomy tube. Some of the back wall of the gallbladder was left in place during the dissection. This area was completely cauterized.  The Gallbladder was placed in a specimen bag. The gallbladder fossa was irrigated and hemostasis was applied with cautery. The gallbladder was removed via the 69mm trocar. A single 0 vicryl was used to close the fascial defect using a suture passer. Pneumoperitoneum was removed, all trocar were removed. All incisions were closed with 4-0 monocryl subcuticular stitch. The patient woke from anesthesia and was brought to PACU in stable condition.  Findings: Acute cholecystitis  Specimen: gallbldadder  Blood loss: 10cc  Local anesthesia: 28cc 0.25% marcaine with epi  Complications: none  Gurney Maxin, M.D. General, Bariatric, & Minimally Invasive Surgery [facility]

## 2015-06-10 NOTE — Interval H&P Note (Signed)
History and Physical Interval Note:  I have evaluated the patient and reviewed the recent history and physical. cholecystostomy drain has stopped draining are the only changes to his current health presentation.   06/10/2015 1:46 PM  Alexander [name]  has presented today for surgery, with the diagnosis of acute cholecystitis, status post cholecystostomy tube  The various methods of treatment have been discussed with the patient and family. After consideration of risks, benefits and other options for treatment, the patient has consented to  Procedure(s): LAPAROSCOPIC CHOLECYSTECTOMY WITH INTRAOPERATIVE CHOLANGIOGRAM (N/A) as a surgical intervention .  [name] history has been reviewed, patient examined, no change in status, stable for surgery.  I have reviewed the patient's chart and labs.  Questions were answered to the patient's satisfaction.     Arta Bruce Kinsinger

## 2015-06-10 NOTE — Anesthesia Preprocedure Evaluation (Addendum)
Anesthesia Evaluation  Patient identified by MRN, date of birth, ID band Patient awake    Reviewed: Allergy & Precautions, NPO status , Patient's Chart, lab work & pertinent test results, reviewed documented beta blocker date and time   Airway Mallampati: I  TM Distance: >3 FB Neck ROM: Full    Dental no notable dental hx. (+) Poor Dentition, Partial Upper   Pulmonary sleep apnea , pneumonia, resolved,  Uses mouth guard for OSA   Pulmonary exam normal breath sounds clear to auscultation       Cardiovascular hypertension, Pt. on medications and Pt. on home beta blockers + CAD and + Past MI  Normal cardiovascular exam+ dysrhythmias Atrial Fibrillation  Rhythm:Regular Rate:Normal  Stent x3 LVEF 45-50% with anteroseptal, anterolateral and apical hypkinesis MI 04/2015 due to septic shock from Acute cholecystitis   Neuro/Psych  Headaches, Depression Post polio syndrome  Neuromuscular disease    GI/Hepatic negative GI ROS, Neg liver ROS, Acute cholecystitis with indwelling cholecystostomy tube   Endo/Other  negative endocrine ROShyperlipidemia  Renal/GU negative Renal ROS  negative genitourinary   Musculoskeletal  (+) Arthritis ,   Abdominal   Peds  Hematology   Anesthesia Other Findings   Reproductive/Obstetrics ED                            Anesthesia Physical Anesthesia Plan  ASA: III  Anesthesia Plan: General   Post-op Pain Management:    Induction:   Airway Management Planned: Oral ETT  Additional Equipment:   Intra-op Plan:   Post-operative Plan: Extubation in OR  Informed Consent: I have reviewed the patients History and Physical, chart, labs and discussed the procedure including the risks, benefits and alternatives for the proposed anesthesia with the patient or authorized representative who has indicated his/her understanding and acceptance.   Dental advisory given  Plan  Discussed with: CRNA, Anesthesiologist and Surgeon  Anesthesia Plan Comments:         Anesthesia Quick Evaluation

## 2015-06-10 NOTE — Anesthesia Procedure Notes (Signed)
Procedure Name: Intubation Date/Time: 06/10/2015 2:30 PM Performed by: [place] Pink Pre-anesthesia Checklist: Patient identified, Emergency Drugs available, Suction available, Patient being monitored and Timeout performed Patient Re-evaluated:Patient Re-evaluated prior to inductionOxygen Delivery Method: Circle system utilized Preoxygenation: Pre-oxygenation with 100% oxygen Intubation Type: IV induction Ventilation: Mask ventilation without difficulty Laryngoscope Size: Mac and 4 Grade View: Grade I Tube type: Oral Tube size: 7.5 mm Number of attempts: 1 Airway Equipment and Method: Stylet and LTA kit utilized Placement Confirmation: ETT inserted through vocal cords under direct vision,  positive ETCO2 and breath sounds checked- equal and bilateral Secured at: 22 cm Tube secured with: Tape Dental Injury: Teeth and Oropharynx as per pre-operative assessment

## 2015-06-10 NOTE — H&P (View-Only) (Signed)
History of Present Illness Alexander Spruce MD; 05/17/2015 10:55 AM) Patient words: New-s/p perc drain/gallbladder.  The patient is a 65 year old male who presents for evaluation of gall stones. 65 year old male who is in the ER last month for abdominal pain found to have acute cholecystitis treated with percutaneous cholecystostomy tube. At same time he also had an STEMI. He has since followed up with cardiology as continue him on amiodarone as well as Rexene Agent for having A. fib episode of RVR in the hospital, which appears which appears resolved. He was in the ER once last week with abdominal pain and underwent imaging found to have no admission a gallbladder with tube in appropriate position. He since has not had any such issues. Family does note that a few days ago the drainage of the tube was more cloudy in nature and had to be flushed and irrigated more frequently. He denies vomiting but does have occasional nausea with certain foods. He denies fevers or chills. He is having bowel movements approximately once per day and they are lighter than prior to this attack.   Other Problems Malachi Bonds, CMA; 05/17/2015 9:48 AM) Arthritis Enlarged Prostate High blood pressure Hypercholesterolemia Other disease, cancer, significant illness  Past Surgical History Malachi Bonds, CMA; 05/17/2015 9:48 AM) Colon Polyp Removal - Colonoscopy  Diagnostic Studies History Malachi Bonds, CMA; 05/17/2015 9:48 AM) Colonoscopy 1-5 years ago  Allergies Malachi Bonds, CMA; 05/17/2015 9:48 AM) No Known Drug Allergies12/16/2016  Medication History Malachi Bonds, CMA; 05/17/2015 9:50 AM) Amiodarone HCl (200MG  Tablet, Oral) Active. Atorvastatin Calcium (80MG  Tablet, Oral) Active. Ciprofloxacin HCl (500MG  Tablet, Oral) Active. Eliquis (5MG  Tablet, Oral) Active. Lisinopril (2.5MG  Tablet, Oral) Active. Methocarbamol (500MG  Tablet, Oral) Active. Metoprolol Tartrate (25MG  Tablet,  Oral) Active. Viagra (100MG  Tablet, Oral) Active. Aspirin (81MG  Tablet, Oral) Active. Vitamin D (2000UNIT Capsule, Oral) Active. Gabapentin (600MG  Tablet, Oral) Active. Multivitamin Adult (Oral) Active. Fish Oil (1000MG  Capsule, Oral) Active. Omeprazole (40MG  Capsule DR, Oral) Active. Normal Saline Flush (0.9% Solution, Injection) Active. Vitamin C (500MG  Tablet, Oral) Active. Vitamin E (400UNIT Capsule, Oral) Active. Medications Reconciled  Social History Malachi Bonds, CMA; 05/17/2015 9:48 AM) Alcohol use Occasional alcohol use. Caffeine use Carbonated beverages, Coffee. No drug use Tobacco use Never smoker.  Family History Malachi Bonds, CMA; 05/17/2015 9:48 AM) Alcohol Abuse Brother, Sister. Arthritis Mother. Breast Cancer Brother, Sister. Diabetes Mellitus Mother. Heart Disease Brother. Hypertension Brother, Sister.    Review of Systems Malachi Bonds CMA; 05/17/2015 9:48 AM) General Present- Fatigue and Weight Loss. Not Present- Appetite Loss, Chills, Fever, Night Sweats and Weight Gain. Skin Not Present- Change in Wart/Mole, Dryness, Hives, Jaundice, New Lesions, Non-Healing Wounds, Rash and Ulcer. HEENT Present- Wears glasses/contact lenses. Not Present- Earache, Hearing Loss, Hoarseness, Nose Bleed, Oral Ulcers, Ringing in the Ears, Seasonal Allergies, Sinus Pain, Sore Throat, Visual Disturbances and Yellow Eyes. Respiratory Not Present- Bloody sputum, Chronic Cough, Difficulty Breathing, Snoring and Wheezing. Breast Not Present- Breast Mass, Breast Pain, Nipple Discharge and Skin Changes. Cardiovascular Not Present- Chest Pain, Difficulty Breathing Lying Down, Leg Cramps, Palpitations, Rapid Heart Rate, Shortness of Breath and Swelling of Extremities. Gastrointestinal Present- Change in Bowel Habits and Gets full quickly at meals. Not Present- Abdominal Pain, Bloating, Bloody Stool, Chronic diarrhea, Constipation, Difficulty Swallowing, Excessive  gas, Hemorrhoids, Indigestion, Nausea, Rectal Pain and Vomiting. Male Genitourinary Present- Frequency, Impotence, Nocturia and Urgency. Not Present- Blood in Urine, Change in Urinary Stream, Painful Urination and Urine Leakage. Musculoskeletal Present- Joint Stiffness, Muscle Pain and Muscle Weakness. Not Present-  Back Pain, Joint Pain and Swelling of Extremities. Neurological Present- Trouble walking and Weakness. Not Present- Decreased Memory, Fainting, Headaches, Numbness, Seizures, Tingling and Tremor. Psychiatric Not Present- Anxiety, Bipolar, Change in Sleep Pattern, Depression, Fearful and Frequent crying. Endocrine Present- Cold Intolerance. Not Present- Excessive Hunger, Hair Changes, Heat Intolerance, Hot flashes and New Diabetes. Hematology Not Present- Easy Bruising, Excessive bleeding, Gland problems, HIV and Persistent Infections.  Vitals (Chemira Jones CMA; 05/17/2015 9:47 AM) 05/17/2015 9:47 AM Weight: 174 lb Height: 71in Body Surface Area: 1.99 m Body Mass Index: 24.27 kg/m  Pulse: 62 (Regular)  BP: 106/68 (Sitting, Left Arm, Standard)       Physical Exam Alexander Spruce MD; 05/17/2015 10:56 AM) General Mental Status-Alert. General Appearance-Cooperative. Orientation-Oriented X4. Posture-Normal posture.  Integumentary Global Assessment Normal Exam - Head/Face: no rashes, ulcers, lesions or evidence of photo damage. No palpable nodules or masses and Neck: no visible lesions or palpable masses.  Head and Neck Head-normocephalic, atraumatic with no lesions or palpable masses. Face Global Assessment - atraumatic. Thyroid Gland Characteristics - normal size and consistency.  Eye Eyeball - Bilateral-Extraocular movements intact. Sclera/Conjunctiva - Bilateral-No scleral icterus, No Discharge.  ENMT Nose and Sinuses Nose - no deformities observed, no swelling present.  Chest and Lung Exam Palpation Normal exam -  Non-tender. Auscultation Breath sounds - Normal.  Cardiovascular Auscultation Rhythm - Regular. Heart Sounds - S1 WNL and S2 WNL. Carotid arteries - No Carotid bruit.  Abdomen Inspection Normal Exam - No Visible peristalsis, No Abnormal pulsations and No Paradoxical movements. Palpation/Percussion Normal exam - Soft, No Rebound tenderness, No Rigidity (guarding), No hepatosplenomegaly and No Palpable abdominal masses. Tenderness - Right Upper Quadrant. Gallbladder - Negative Murphy's sign. Note: Right upper quadrant drain in place draining gold liquid with some sedimentation.   Peripheral Vascular Upper Extremity Palpation - Pulses bilaterally normal. Lower Extremity Palpation - Edema - Bilateral - No edema.  Neurologic Neurologic evaluation reveals -normal sensation and normal coordination.  Neuropsychiatric Mental status exam performed with findings of-able to articulate well with normal speech/language, rate, volume and coherence and thought content normal with ability to perform basic computations and apply abstract reasoning.  Musculoskeletal Normal Exam - Bilateral-Upper Extremity Strength Normal and Lower Extremity Strength Normal.    Assessment & Plan Alexander Spruce MD; 05/17/2015 10:59 AM) GALL BLADDER STONES (K80.20) Impression: 64 year old male who had acute cholecystitis treated with cholecystostomy tube at time of NST EMI. Cardiology has cleared him for procedure however I would like to continue to wait and give at least 2 months since his heart strain for proceeding with procedure. I continue to recommend a low-fat diet to avoid issues of this pain. We discussed the procedure in detail including gallbladder and its physiologic functions performing laparoscopic cholecystectomy with removal of cholecystostomy tube with possible need for open procedure as well as intraoperative cholangiogram to assess the rest of the biliary tree. Discussed the risks of the  procedure as injury to liver, leak from where the drain is entering the liver, leak from the cystic duct clipped, injury to common bile duct, infection, injury to intestines or other abdominal structures. Patient and family should be understanding one to proceed. -I will schedule this at the end of January or beginning of February. -Plan for inpatient admission for monitoring -Plan for drain placement. Current Plans The anatomy & physiology of hepatobiliary & pancreatic function was discussed. The pathophysiology of gallbladder dysfunction was discussed. Natural history risks without surgery was discussed. I feel the risks of no  intervention will lead to serious problems that outweigh the operative risks; therefore, I recommended cholecystectomy to remove the pathology. I explained laparoscopic techniques with possible need for an open approach. Probable cholangiogram to evaluate the bilary tract was explained as well.  Risks such as bleeding, infection, abscess, leak, injury to other organs, need for further treatment, heart attack, death, and other risks were discussed. I noted a good likelihood this will help address the problem. Possibility that this will not correct all abdominal symptoms was explained. Goals of post-operative recovery were discussed as well. We will work to minimize complications. An educational handout further explaining the pathology and treatment options was given as well. Questions were answered. The patient expresses understanding & wishes to proceed with surgery.  Pt Education - Laparoscopic Cholecystectomy: gallbladder You are being scheduled for surgery - Our schedulers will call you.  You should hear from our office's scheduling department within 5 working days about the location, date, and time of surgery. We try to make accommodations for patient's preferences in scheduling surgery, but sometimes the OR schedule or the surgeon's schedule prevents [place] from making those  accommodations.  If you have not heard from our office [phone]) in 5 working days, call the office and ask for your surgeon's nurse.  If you have other questions about your diagnosis, plan, or surgery, call the office and ask for your surgeon's nurse.

## 2015-06-10 NOTE — Anesthesia Postprocedure Evaluation (Signed)
Anesthesia Post Note  Patient: Alexander [name]  Procedure(s) Performed: Procedure(s): LAPAROSCOPIC CHOLECYSTECTOMY  Patient location during evaluation: PACU Anesthesia Type: General Level of consciousness: awake and alert Pain management: pain level controlled Vital Signs Assessment: post-procedure vital signs reviewed and stable Respiratory status: spontaneous breathing, nonlabored ventilation, respiratory function stable and patient connected to nasal cannula oxygen Cardiovascular status: blood pressure returned to baseline and stable Postop Assessment: no signs of nausea or vomiting Anesthetic complications: no    Last Vitals:  Filed Vitals:   06/10/15 1126 06/10/15 1610  BP: 111/73 148/82  Pulse: 53 64  Temp: 36.7 C 36.3 C  Resp: 16 15    Last Pain:  Filed Vitals:   06/10/15 1628  PainSc: 5                  Jodie Leiner S

## 2015-06-10 NOTE — Transfer of Care (Signed)
Immediate Anesthesia Transfer of Care Note  Patient: Alexander [name]  Procedure(s) Performed: Procedure(s): LAPAROSCOPIC CHOLECYSTECTOMY  Patient Location: PACU  Anesthesia Type:General  Level of Consciousness: awake, alert , oriented, patient cooperative and responds to stimulation  Airway & Oxygen Therapy: Patient Spontanous Breathing and Patient connected to nasal cannula oxygen  Post-op Assessment: Report given to RN, Post -op Vital signs reviewed and stable and Patient moving all extremities X 4  Post vital signs: Reviewed and stable  Last Vitals:  Filed Vitals:   06/10/15 1126 06/10/15 1610  BP: 111/73 148/82  Pulse: 53 64  Temp: 36.7 C   Resp: 16 15    Complications: No apparent anesthesia complications

## 2015-06-11 ENCOUNTER — Encounter (HOSPITAL_COMMUNITY): Payer: Self-pay | Admitting: General Surgery

## 2015-06-11 LAB — CBC
HCT: 40.2 % (ref 39.0–52.0)
Hemoglobin: 13.5 g/dL (ref 13.0–17.0)
MCH: 32.5 pg (ref 26.0–34.0)
MCHC: 33.6 g/dL (ref 30.0–36.0)
MCV: 96.9 fL (ref 78.0–100.0)
PLATELETS: 294 10*3/uL (ref 150–400)
RBC: 4.15 MIL/uL — AB (ref 4.22–5.81)
RDW: 14 % (ref 11.5–15.5)
WBC: 18 10*3/uL — AB (ref 4.0–10.5)

## 2015-06-11 MED ORDER — OXYCODONE HCL 5 MG PO TABS: 5.0000 mg | ORAL_TABLET | ORAL | Status: DC | PRN

## 2015-06-11 NOTE — Progress Notes (Signed)
Discussed discharge summary with patient. Reviewed all medications with patient. Patient received Rx. Patient ready for discharge. 

## 2015-06-11 NOTE — Discharge Summary (Signed)
Physician Discharge Summary  Patient ID: Alexander [name] MRN: [id number] DOB/AGE: 1950-08-11 65 y.o.  Admit date: 06/10/2015 Discharge date: 06/11/2015  Admission Diagnoses:  Discharge Diagnoses:  Active Problems:   Acute cholecystitis   Discharged Condition: good  Hospital Course: Alexander [name] was admitted after undergoing laparoscopic cholecystectomy. He did well, tolerated a diet, pain improved and he was discharged home.  Consults: None  Significant Diagnostic Studies: labs: HGB 13.5  Treatments: surgery: lap chole  Discharge Exam: Blood pressure 109/70, pulse 73, temperature 98.7 F (37.1 C), temperature source Oral, resp. rate 17, height 5\' 11"  (1.803 m), weight 80.74 kg (178 lb), SpO2 96 %. General appearance: alert and cooperative Head: Normocephalic, without obvious abnormality, atraumatic Resp: clear to auscultation bilaterally Cardio: S1, S2 normal GI: soft, non-tender; bowel sounds normal; no masses,  no organomegaly and incisions c/d/i  Disposition: 01-Home or Self Care  Discharge Instructions    Call MD for:  persistant nausea and vomiting    Complete by:  As directed      Call MD for:  redness, tenderness, or signs of infection (pain, swelling, redness, odor or green/yellow discharge around incision site)    Complete by:  As directed      Call MD for:  severe uncontrolled pain    Complete by:  As directed      Call MD for:  temperature >100.4    Complete by:  As directed      Diet - low sodium heart healthy    Complete by:  As directed      Increase activity slowly    Complete by:  As directed             Medication List    STOP taking these medications        apixaban 5 MG Tabs tablet  Commonly known as:  ELIQUIS     ciprofloxacin 500 MG tablet  Commonly known as:  CIPRO     levofloxacin 750 MG tablet  Commonly known as:  LEVAQUIN      TAKE these medications        amiodarone 200 MG tablet  Commonly known as:  PACERONE  Take 1 tablet (200 mg  total) by mouth 2 (two) times daily.     aspirin 81 MG chewable tablet  Chew 1 tablet (81 mg total) by mouth daily.     atorvastatin 80 MG tablet  Commonly known as:  LIPITOR  Take 1 tablet (80 mg total) by mouth daily at 6 PM.     Cholecalciferol 2000 units Caps  Commonly known as:  [employer] VITAMIN D  Take 1 capsule (2,000 Units total) by mouth daily.     Fish Oil 1000 MG Caps  Take 1 capsule by mouth daily.     gabapentin 600 MG tablet  Commonly known as:  NEURONTIN  TAKE 2 TABLETS THREE TIMES A DAY     lisinopril 2.5 MG tablet  Commonly known as:  PRINIVIL,ZESTRIL  Take 1 tablet (2.5 mg total) by mouth daily.     methocarbamol 500 MG tablet  Commonly known as:  ROBAXIN  TAKE 1 TABLET THREE TIMES A DAY AS NEEDED FOR MUSCLE SPASM     metoprolol tartrate 25 MG tablet  Commonly known as:  LOPRESSOR  Take 0.5 tablets (12.5 mg total) by mouth 2 (two) times daily.     MULTIVITAMIN ADULTS 50+ PO  Take 1 tablet by mouth daily.     Normal Saline Flush 0.9 % Soln  Inject  10 mLs into the vein every morning.     omeprazole 40 MG capsule  Commonly known as:  PRILOSEC  TAKE 1 CAPSULE DAILY     oxyCODONE 5 MG immediate release tablet  Commonly known as:  Oxy IR/ROXICODONE  Take 1-2 tablets (5-10 mg total) by mouth every 4 (four) hours as needed for moderate pain.     sildenafil 100 MG tablet  Commonly known as:  VIAGRA  Take 0.5-1 tablets (50-100 mg total) by mouth daily as needed for erectile dysfunction.     vitamin C 500 MG tablet  Commonly known as:  ASCORBIC ACID  Take 1,000 mg by mouth daily.     vitamin E 400 UNIT capsule  Take 400 Units by mouth daily.           Follow-up Information    Follow up with Mickeal Skinner, MD In 3 weeks.   Specialty:  General Surgery   Contact information:   [address] [place] [address] [phone]       Signed: Arta Bruce Kinsinger 06/11/2015, 7:40 AM

## 2015-06-14 ENCOUNTER — Telehealth: Payer: Self-pay | Admitting: Family Medicine

## 2015-06-14 NOTE — Telephone Encounter (Signed)
error 

## 2015-06-17 ENCOUNTER — Ambulatory Visit: Payer: Medicare Other | Admitting: Pulmonary Disease

## 2015-06-20 ENCOUNTER — Telehealth: Payer: Self-pay | Admitting: Cardiology

## 2015-06-20 MED ORDER — AMIODARONE HCL 200 MG PO TABS: 200.0000 mg | ORAL_TABLET | Freq: Every day | ORAL | Status: DC

## 2015-06-20 NOTE — Telephone Encounter (Signed)
Pt's wife called in stating that Alexander [name] was prescribed for the pt in the hospital by Dr. Ellyn Hack. The pt now has 1 pill left and she wanted to know if this is a medication that will need to be refilled. Please f/u with her  Thanks

## 2015-06-20 NOTE — Telephone Encounter (Signed)
Confirmed dose of amiodarone, refilled - wife aware. Also scheduled for f/u visit for April - wife aware may be able to add to wait list for sooner visit dependent on need, they will wait on this for now - Dr. Ellyn Hack had referred pt for pulmonology eval to be done prior to return here. Pt had emergency lap chole last week - wife wanted to have pt cleared by GI for a pulmonology f/u - he will not be able to do PFT or other studies adequately until OK'd by GI.  Pt aware to call for any needs.

## 2015-07-15 ENCOUNTER — Telehealth: Payer: Self-pay

## 2015-07-15 NOTE — Telephone Encounter (Signed)
Pt's wife thought pt needed to schedule the CXR. In[facility] her [facility] and order has already been placed. Nothing further needed.

## 2015-07-15 NOTE — Telephone Encounter (Signed)
Pt wife called, and needs to r/s pt chest xray. Please call.

## 2015-07-15 NOTE — Telephone Encounter (Signed)
LMOM for wife to return call. Looked for CXR results and called radiology. Radiology states pt hasn't been to have it done. Will await call back.

## 2015-07-16 ENCOUNTER — Other Ambulatory Visit: Payer: Self-pay | Admitting: *Deleted

## 2015-07-16 MED ORDER — METHOCARBAMOL 500 MG PO TABS: ORAL_TABLET | ORAL | Status: DC

## 2015-07-16 NOTE — Telephone Encounter (Signed)
Last filled #90 x 1 refill on 10/04/14.  Last CPE was 07/12/14, patient last seen for hospital follow up with Webb Silversmith on 05/03/15.  Okay to refill?

## 2015-07-24 ENCOUNTER — Telehealth: Payer: Self-pay | Admitting: *Deleted

## 2015-07-24 NOTE — Telephone Encounter (Signed)
Received letter from ins that Methocarbamol isn't on it formulary, and will require PA. Forms placed in your inbox.

## 2015-07-25 NOTE — Telephone Encounter (Signed)
Filled and in Kim's box. no list of formulary alternatives.

## 2015-07-29 NOTE — Telephone Encounter (Signed)
PA faxed. Will await determination. 

## 2015-07-31 DIAGNOSIS — H40033 Anatomical narrow angle, bilateral: Secondary | ICD-10-CM | POA: Diagnosis not present

## 2015-07-31 DIAGNOSIS — H04123 Dry eye syndrome of bilateral lacrimal glands: Secondary | ICD-10-CM | POA: Diagnosis not present

## 2015-07-31 NOTE — Telephone Encounter (Signed)
Rx approved. Pharmacy notified. 

## 2015-08-01 ENCOUNTER — Ambulatory Visit
Admission: RE | Admit: 2015-08-01 | Discharge: 2015-08-01 | Disposition: A | Discharge status: Home / Self Care | Payer: Medicare Other | Source: Ambulatory Visit | Attending: Pulmonary Disease | Admitting: Pulmonary Disease

## 2015-08-01 ENCOUNTER — Ambulatory Visit (INDEPENDENT_AMBULATORY_CARE_PROVIDER_SITE_OTHER): Payer: Medicare Other | Admitting: *Deleted

## 2015-08-01 DIAGNOSIS — R918 Other nonspecific abnormal finding of lung field: Secondary | ICD-10-CM | POA: Insufficient documentation

## 2015-08-01 DIAGNOSIS — M5134 Other intervertebral disc degeneration, thoracic region: Secondary | ICD-10-CM | POA: Diagnosis not present

## 2015-08-01 LAB — PULMONARY FUNCTION TEST
DL/VA % pred: 57 %
DL/VA: 2.7 ml/min/mmHg/L
DLCO UNC: 20.37 ml/min/mmHg
DLCO unc % pred: 60 %
FEF 25-75 POST: 5.8 L/s
FEF 25-75 Pre: 5.75 L/sec
FEF2575-%Change-Post: 0 %
FEF2575-%Pred-Post: 207 %
FEF2575-%Pred-Pre: 205 %
FEV1-%Change-Post: 1 %
FEV1-%PRED-PRE: 129 %
FEV1-%Pred-Post: 131 %
FEV1-POST: 4.65 L
FEV1-Pre: 4.58 L
FEV1FVC-%Change-Post: 2 %
FEV1FVC-%Pred-Pre: 112 %
FEV6-%Change-Post: -1 %
FEV6-%PRED-POST: 118 %
FEV6-%PRED-PRE: 119 %
FEV6-POST: 5.35 L
FEV6-Pre: 5.42 L
FEV6FVC-%PRED-POST: 105 %
FEV6FVC-%Pred-Pre: 105 %
FVC-%Change-Post: -1 %
FVC-%PRED-PRE: 113 %
FVC-%Pred-Post: 112 %
FVC-POST: 5.35 L
FVC-Pre: 5.42 L
POST FEV6/FVC RATIO: 100 %
PRE FEV1/FVC RATIO: 85 %
Post FEV1/FVC ratio: 87 %
Pre FEV6/FVC Ratio: 100 %
RV % PRED: 82 %
RV: 1.99 L
TLC % PRED: 104 %
TLC: 7.55 L

## 2015-08-01 NOTE — Progress Notes (Signed)
PFT performed today. 

## 2015-08-27 ENCOUNTER — Encounter: Payer: Self-pay | Admitting: Pulmonary Disease

## 2015-08-27 ENCOUNTER — Ambulatory Visit (INDEPENDENT_AMBULATORY_CARE_PROVIDER_SITE_OTHER): Payer: Medicare Other | Admitting: Pulmonary Disease

## 2015-08-27 VITALS — BP 110/64 | HR 54 | Ht 71.0 in | Wt 182.0 lb

## 2015-08-27 DIAGNOSIS — R918 Other nonspecific abnormal finding of lung field: Secondary | ICD-10-CM | POA: Diagnosis not present

## 2015-08-27 DIAGNOSIS — I255 Ischemic cardiomyopathy: Secondary | ICD-10-CM | POA: Diagnosis not present

## 2015-08-27 DIAGNOSIS — R942 Abnormal results of pulmonary function studies: Secondary | ICD-10-CM

## 2015-09-02 ENCOUNTER — Ambulatory Visit (INDEPENDENT_AMBULATORY_CARE_PROVIDER_SITE_OTHER): Payer: Medicare Other | Admitting: Cardiology

## 2015-09-02 ENCOUNTER — Encounter: Payer: Self-pay | Admitting: Cardiology

## 2015-09-02 VITALS — BP 118/82 | HR 48 | Ht 71.0 in | Wt 186.0 lb

## 2015-09-02 DIAGNOSIS — Z9861 Coronary angioplasty status: Secondary | ICD-10-CM | POA: Diagnosis not present

## 2015-09-02 DIAGNOSIS — I2510 Atherosclerotic heart disease of native coronary artery without angina pectoris: Secondary | ICD-10-CM | POA: Insufficient documentation

## 2015-09-02 DIAGNOSIS — I255 Ischemic cardiomyopathy: Secondary | ICD-10-CM | POA: Diagnosis not present

## 2015-09-02 DIAGNOSIS — I214 Non-ST elevation (NSTEMI) myocardial infarction: Secondary | ICD-10-CM

## 2015-09-02 DIAGNOSIS — I10 Essential (primary) hypertension: Secondary | ICD-10-CM

## 2015-09-02 DIAGNOSIS — E785 Hyperlipidemia, unspecified: Secondary | ICD-10-CM | POA: Diagnosis not present

## 2015-09-02 DIAGNOSIS — I480 Paroxysmal atrial fibrillation: Secondary | ICD-10-CM | POA: Diagnosis not present

## 2015-09-02 NOTE — Assessment & Plan Note (Signed)
Widely patent stents on recent cath. He is on aspirin and statin as well as low-dose ACE inhibitor and beta blocker. No active anginal symptoms. Stable.

## 2015-09-02 NOTE — Assessment & Plan Note (Signed)
EF seemed to improved by LV gram from 40-45 up to 45-50%.  I would hope that if we are to reassess with an echo to medications, and while in normal sinus rhythm, his EF is probably could be 45-50% based on wall motion and amount is noted on echo. He probably had a prior LAD infarct. Not showing any signs of heart failure. Not requiring any diuretic. He is on a beta blocker and ACE inhibitor.

## 2015-09-02 NOTE — Assessment & Plan Note (Addendum)
History of MI with wall motion abnormality in the anterior wall suggesting that this may in LAD infarct in the past. However the most recent episode was probably more consistent with demand ischemia. No ischemic lesions noted on cath. EF by echo was 40-45% but by cath when he was feeling better was oriented 45 -50% suggesting improvement.

## 2015-09-02 NOTE — Progress Notes (Signed)
PROBLEMS: Recent severe sepsis due to acute cholecystitis c/b AFRVR and acute lung injury/ARDS S/P laparoscopic cholecystectomy 06/10/15  INTERVAL HISTORY: Lap choly 06/10/15  SUBJ: No new complaints. Continues to recover and improve. Denies CP, fever, purulent sputum, hemoptysis, LE edema and calf tenderness  OBJ: Filed Vitals:   08/27/15 1048  BP: 110/64  Pulse: 54  Height: 5\' 11"  (1.803 m)  Weight: 182 lb (82.555 kg)  SpO2: 97%    Gen: WDWN in NAD HEENT: All WNL Neck: NO LAN, no JVD noted Lungs: full BS, normal percussion note throughout, no adventitious sounds Cardiovascular: Reg rate, normal rhythm, no M noted Abdomen: Soft, NT +BS Ext: no C/C/E Neuro: CNs intact, motor/sens grossly intact Skin: No lesions noted   DATA: CXR 08/01/15: mild chronic appearing interstitial prominence. NAD  PFTs 08/01/15: normal spirometry, normal lung volumes, mild to moderate decrease in DLCO   IMPRESSION: Recovering well from recent critical illness. He has mild persistent CXR findings of interstitial prominence and mild decrease in DLCO which are likely sequelae of prior ALI/ARDS. It is noted that he is on amiodarone therapy under the direction of Dr Ellyn Hack but he reports that this might be stopped in the near future. Presently, he has no respiratory or pulmonary symptoms  PLAN: Based on CXR and PFTs, I think it is OK to continue amiodarone if Dr Ellyn Hack believes it is necessary. If so, we will have to monitor CXR and PFTs periodically.  ROV 3-4 months with CXR    Wilhelmina Mcardle, MD [facility]

## 2015-09-02 NOTE — Patient Instructions (Addendum)
Stop  Amiodarone   Keep a eye on your heart rate, if resting heart rate stays above 80 - start taking a whole Tablet of your metoprolol twice a day.  Your physician wants you to follow-up in 6 months with Dr Ellyn Hack.  You will receive a reminder letter in the mail two months in advance. If you don't receive a letter, please call our office to schedule the follow-up appointment.  If you need a refill on your cardiac medications before your next appointment, please call your pharmacy.

## 2015-09-02 NOTE — Assessment & Plan Note (Addendum)
The main plan was to remain on amiodarone perioperatively to avoid recurrence of A. fib during his operation multiple still sick from cholecystitis. I think wearing now far enough out the operation to begin weaning off amiodarone. He basically take a pill every other day for the next week and then stop. I wanted to monitor his heart rate at home. If his resting heart rate raise above 80, I would like for him to increase his metoprolol dose to a full 25 mg twice a day.  CHA2DS2VASc of 4 --> although it does seem that the episode may have well been triggered by his sepsis, he still now has a substrate. I think is probably reasonable to protect him for stroke because with his prior neurologic injury from polio, he cannot tolerate a stroke. Would continue ELIQUIS 5 mg twice a day. Not having bleeding issues.

## 2015-09-02 NOTE — Progress Notes (Signed)
PCP: [name], MD  Clinic Note: Chief Complaint  Patient presents with  . Follow-up    pt wants to discuss meds  . Coronary Artery Disease  . Atrial Fibrillation    HPI: Alexander [name] is a 65 y.o. male with a PMH below (Notable for recent hospitalization with cholecystitis, acute by sepsis with a 2 fibrillation then non-ST elevation MI) who presents today for 3 month follow-up and post surgical follow-up. He does have a relatively long history of CAD dating back to 20. In November 2016, he was admitted for acute cholecystitis and became septic. There is also apparently some chest pain involved with a hypotension episode. Initially was treated with a percutaneous drain, but did become septic and this was complicated by atrial fibrillation requiring amiodarone assisted cardioversion. His troponin elevation to 5.2 and reduced EF on echo, we felt it prudent to perform cardiac catheterization prior to discharge. Thankfully this should reveal all of the stents were patent. It also showed some improvement in EF.  Alexander [name] was last seen on 05/13/2015 by Murray Hodgkins, NP -- this was shortly after his hospital stay. He actually been readmitted to [facility] with acute pain in the abdomen that was found to be benign. He was stable on amiodarone and the time.  Recent Hospitalizations:  he underwent cholecystitis in January   Studies Reviewed:  * Echo: Reviewed. Results in [place] * Cath: Reviewed. Results in PSH/PMH * Cholecystectomy - Jan @ [facility].No complications * PFTs: Results have not been reviewed yet by pulmonologist   Interval History: He presents today feeling well without any major complaints. His only question is about what medicines he is taking. He is disabled and walks with 2 canes, he has bilateral leg braces but despite this he still walks around his backyard. To make a full circuit of the backyard he wants to go downhill part of the way. In doing this he may get a little bit  tired and a bit short of breath while walking around the full circuit, but states that he still has not fully gotten back his strength from his recent illness and operation.  He denies any  chest painor pressure with rest or exertion. He does have some  shortness of breath withvigorous  exertion.  He has not had any heart failure symptoms of PND, orthopnea or edema. Since discharge on amiodarone, he has not had any  palpitations, lightheadedness, dizziness, weakness or syncope/near syncope. No TIA/amaurosis fugax symptoms. No melena, hematochezia, hematuria, or epstaxis. No claudication.  ROS: A comprehensive was performed. Review of Systems  Constitutional: Negative for weight loss and malaise/fatigue.  Eyes: Negative for blurred vision.  Respiratory: Negative for cough, sputum production and wheezing.   Cardiovascular: Negative for claudication.  Gastrointestinal: Negative for heartburn, abdominal pain, constipation, blood in stool and melena.  Musculoskeletal:       Walks with bilateral braces and 2 canes  Neurological: Negative for headaches (No more than his usual).  Endo/Heme/Allergies: Does not bruise/bleed easily.  Psychiatric/Behavioral: Negative.   All other systems reviewed and are negative.   Past Medical History  Diagnosis Date  . NSTEMI (non-ST elevated myocardial infarction) ([place]) 04/2015    - Demand Ischemia in setting of Sepsis (also with prior MI history  . CAD S/P percutaneous coronary angioplasty 1995, 2000, 2005    a. 1995 s/p BMS;  b. 2000 s/p BMS;  c. 2005 s/p stent - All stents in [facility] (RCA, LAD & OM - unknown on which date);  d. 04/2015 NSTEMI/Cath: LM nl, ost/p LAD 20%, patent mLAD stent, RI small, OM2 patent stent, pRCA 20%, patent stent, EF 45-50%-->Med Rx.  . Ischemic cardiomyopathy     a. 04/2015 Echo: EF 40-45%, mod antsept, ant, antlat, apical HK, mild AI/MR, PASP 78mmHg.  Marland Kitchen PAF (paroxysmal atrial fibrillation) ([place])     a. 04/2015 in setting of  cholecystits and sepsis -->Amio;  b. CHA2DS2VASc = 4--> Eliquis.  . Essential hypertension   . HLD (hyperlipidemia)   . History of post poliomyelitis muscular atrophy   . Post-polio syndrome     a. ambulates with braces.  . Neuropathy ([place])   . OSA (obstructive sleep apnea)     does not want to use CPAP, using mouth guard  . Chronic pain syndrome     a. Followed @ [facility];  b. Uses 12-14 excedrins per day.  . Pneumonia 04/2015  . Neuropathy ([place])     due to post polio syndrome  . Migraines   . History of depression   . Arthritis   . Shoulder pain, right     Past Surgical History  Procedure Laterality Date  . Uvulopalatopharyngoplasty, tonsillectomy and septoplasty  2002  . Lumbar spine stimulator    . Cervical spine stimulator  08/2012  . Colonoscopy  09/2013    TA x2, rpt 5 yrs (Pyrtle)  . Coronary angioplasty with stent placement  1995, 2000, 2005    stents in RCA, LAD & Cx-OM.  Marland Kitchen Cardiac catheterization N/A 04/30/2015    Procedure: Left Heart Cath and Coronary Angiography;  Surgeon: Troy Sine, MD;  Location: [place] [facility];  Service: Cardiovascular; LM nl, ost/p LAD 20%, patent mLAD stent, RI small, OM2 patent stent, pRCA 20%, patent stent, EF 45-50%-->Med Rx   . Cholecystectomy  06/10/2015    Procedure: LAPAROSCOPIC CHOLECYSTECTOMY;  Surgeon: Arta Bruce Kinsinger, MD;  Location: [place];  Service: General;;   Prior to Admission medications   Medication Sig Start Date End Date Taking? Authorizing Provider  amiodarone (PACERONE) 200 MG tablet Take 1 tablet (200 mg total) by mouth daily. 06/20/15  Yes Leonie Man, MD  aspirin 81 MG chewable tablet Chew 1 tablet (81 mg total) by mouth daily. 05/02/15  Yes Thurnell Lose, MD  atorvastatin (LIPITOR) 80 MG tablet Take 1 tablet (80 mg total) by mouth daily at 6 PM. 05/30/15  Yes Rogelia Mire, NP  Cholecalciferol (CVS VITAMIN D) 2000 UNITS CAPS Take 1 capsule (2,000 Units total) by mouth daily. 03/29/13  Yes  [name], MD  gabapentin (NEURONTIN) 600 MG tablet TAKE 2 TABLETS THREE TIMES A DAY 03/11/15  Yes [name], MD  lisinopril (PRINIVIL,ZESTRIL) 2.5 MG tablet Take 1 tablet (2.5 mg total) by mouth daily. 05/30/15  Yes Rogelia Mire, NP  methocarbamol (ROBAXIN) 500 MG tablet TAKE 1 TABLET THREE TIMES A DAY AS NEEDED FOR MUSCLE SPASM 07/16/15  Yes [name], MD  metoprolol tartrate (LOPRESSOR) 25 MG tablet Take 0.5 tablets (12.5 mg total) by mouth 2 (two) times daily. 05/30/15  Yes Rogelia Mire, NP  Multiple Vitamins-Minerals (MULTIVITAMIN ADULTS 50+ PO) Take 1 tablet by mouth daily.   Yes Historical Provider, MD  Omega-3 Fatty Acids (FISH OIL) 1000 MG CAPS Take 1 capsule by mouth daily.   Yes Historical Provider, MD  sildenafil (VIAGRA) 100 MG tablet Take 0.5-1 tablets (50-100 mg total) by mouth daily as needed for erectile dysfunction. 01/03/15  Yes [name], MD  vitamin C (ASCORBIC  ACID) 500 MG tablet Take 1,000 mg by mouth daily.   Yes Historical Provider, MD  vitamin E 400 UNIT capsule Take 400 Units by mouth daily.   Yes Historical Provider, MD    No Known Allergies   Social History   Social History  . Marital Status: Married    Spouse Name: N/A  . Number of Children: N/A  . Years of Education: N/A   Social History Main Topics  . Smoking status: Never Smoker   . Smokeless tobacco: Never Used  . Alcohol Use: No  . Drug Use: No  . Sexual Activity: Not Asked   Other Topics Concern  . 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 Diabetes Mother   . Cancer Sister 18    male cancer  . Coronary artery disease Neg Hx     . Stroke Neg Hx   . Colon cancer Neg Hx   . Pancreatic cancer Neg Hx   . Stomach cancer Neg Hx   . Cancer Brother 60    breast     Wt Readings from Last 3 Encounters:  09/02/15 186 lb (84.369 kg)  08/27/15 182 lb (82.555 kg)  06/10/15 178 lb (80.74 kg)    PHYSICAL EXAM BP 118/82 mmHg  Pulse 48  Ht 5\' 11"  (1.803 m)  Wt 186 lb (84.369 kg)  BMI 25.95 kg/m2 General appearance: alert, cooperative, appears stated age, no distress and Very robust appearing gentleman who is wearing bilateral leg braces to the thighs and walks with 2 canes because of postpolio syndrome. But he has very robust upper body strength Neck: no adenopathy, no carotid bruit and no JVD Lungs:CTA B, normal percussion bilaterally and non-labored Heart:Somewhat bradycardicewith normal rhythm but some ectopy. Normal S1 and S2.  No murmur, click, rub or gallop  Abdomen: soft, non-tender; bowel sounds normal; no masses,  no HSM; no HJR. No more tenderness in RUQ Extremities: extremities normal, atraumatic, no cyanosis, and edema; bilateral leg braces  Pulses: 2+ and symmetric;  Skin: mobility and turgor normal  Neurologic: Mental status: Alert, oriented, thought content appropriate Cranial nerves: normal (II-XII grossly intact)  Bilateral Leg braces - bilateral canes.   Adult ECG Report  Rate: 48 ;  Rhythm: sinus bradycardia; corrected QTC is normal. Otherwise normal axis and intervals/durations. Normal voltage.  Narrative Interpretation: Stable EKG   Other studies Reviewed: Additional studies/ records that were reviewed today include:  Recent Labs:  Due to get checked by PCP Lab Results  Component Value Date   CHOL 209* 07/05/2014   HDL 54.80 07/05/2014   LDLCALC 134* 07/05/2014   LDLDIRECT 213.1 06/29/2013   TRIG 101.0 07/05/2014   CHOLHDL 4 07/05/2014    ASSESSMENT / PLAN: Problem List Items Addressed This Visit    PAF (paroxysmal atrial fibrillation) (HCC) (Chronic)    The main plan was to remain  on amiodarone perioperatively to avoid recurrence of A. fib during his operation multiple still sick from cholecystitis. I think wearing now far enough out the operation to begin weaning off amiodarone. He basically take a pill every other day for the next week and  then stop. I wanted to monitor his heart rate at home. If his resting heart rate raise above 80, I would like for him to increase his metoprolol dose to a full 25 mg twice a day.  CHA2DS2VASc of 4 --> although it does seem that the episode may have well been triggered by his sepsis, he still now has a substrate. I think is probably reasonable to protect him for stroke because with his prior neurologic injury from polio, he cannot tolerate a stroke. Would continue ELIQUIS 5 mg twice a day. Not having bleeding issues.      NSTEMI (non-ST elevated myocardial infarction) (HCC) (Chronic)    History of MI with wall motion abnormality in the anterior wall suggesting that this may in LAD infarct in the past. However the most recent episode was probably more consistent with demand ischemia. No ischemic lesions noted on cath. EF by echo was 40-45% but by cath when he was feeling better was oriented 45 -50% suggesting improvement.      Ischemic cardiomyopathy (Chronic)    EF seemed to improved by LV gram from 40-45 up to 45-50%.  I would hope that if we are to reassess with an echo to medications, and while in normal sinus rhythm, his EF is probably could be 45-50% based on wall motion and amount is noted on echo. He probably had a prior LAD infarct. Not showing any signs of heart failure. Not requiring any diuretic. He is on a beta blocker and ACE inhibitor.      Relevant Orders   EKG 12-Lead   Hyperlipidemia with target LDL less than 70 (Chronic)   Essential hypertension (Chronic)    Stable blood pressure today on low-dose ACE inhibitor and beta blocker. No need for change.      Relevant Orders   EKG 12-Lead   CAD S/P percutaneous coronary  angioplasty - Primary (Chronic)    Widely patent stents on recent cath. He is on aspirin and statin as well as low-dose ACE inhibitor and beta blocker. No active anginal symptoms. Stable.      Relevant Orders   EKG 12-Lead      Current medicines are reviewed at length with the patient today. (+/- concerns) How long does need to take amiodarone. Will he need to continue to take ELIQUIS.  The following changes have been made:    Studies Ordered:   Orders Placed This Encounter  Procedures  . EKG 12-Lead      Leonie Man, M.D., M.S. Interventional Cardiologist   Pager # [phone] Phone # [phone] [address]. [place], [place] [address]

## 2015-09-02 NOTE — Assessment & Plan Note (Signed)
Stable blood pressure today on low-dose ACE inhibitor and beta blocker. No need for change.

## 2015-09-06 ENCOUNTER — Telehealth: Payer: Self-pay | Admitting: Cardiology

## 2015-09-06 NOTE — Telephone Encounter (Signed)
°  New message   Wife at the pharmacy now the cost of eliquis is  $ 198.00 a month with insurance . Need an alternative medication.

## 2015-09-09 NOTE — Telephone Encounter (Signed)
SPOKE TO PATIENT AND WIFE INFORMED BOTH - THAT A FREE SAVING CARD AND  $10 SAVING CARD AVAILABLE   WIFE STATES SHE HAD ALREADY PAID FOR MEDICATION TODAY, BUT SHE WILL COME BY AN PICK UP THE SAVING CARDS.

## 2015-10-02 ENCOUNTER — Telehealth: Payer: Self-pay | Admitting: Cardiology

## 2015-10-02 MED ORDER — RIVAROXABAN 20 MG PO TABS: 20.0000 mg | ORAL_TABLET | Freq: Every day | ORAL | Status: DC

## 2015-10-02 NOTE — Telephone Encounter (Signed)
Change to Xarelto. [name], Alexander W

## 2015-10-02 NOTE — Telephone Encounter (Signed)
Spoke left message to call back xarelto  20 mg  Daily at meal time ( e-sent to pharmacy)  stop eliquis

## 2015-10-02 NOTE — Telephone Encounter (Signed)
Alexander [name] is calling about Alexander [name] , they went to get the medication Eliquis and their co-pay is $200 and the discount card is only good for one use a lifetime and the $10 copay card  does not work because he has Medicare Part D prescription coverage and was told it can't be a government sponsored prescription coverage. Would like to know is there something else in which he can take that is more cost effective for them. Please call.  Thanks

## 2015-10-02 NOTE — Telephone Encounter (Signed)
Spoke to wife medication -- Eliquis is not on formulary PATIENT HAS [place] is on SILVERSCRIPT  INFORMED WIFE WILL DEFER TO DR HARDING AND CONTACT HER BACK

## 2015-10-03 NOTE — Telephone Encounter (Signed)
Returned call to patient.She stated she was returning Sharon's call.Dr.Harding advised to stop Eliquis due to high cost.Advised to start Xarelto 20 mg daily with supper.Ivin Booty already sent prescription to pharmacy.Advised I will leave samples at front desk.

## 2015-10-03 NOTE — Telephone Encounter (Signed)
Follow-up   Patient wife [name])  is returning the phone nurse left yesterday evening a message for a change in medications.

## 2015-10-28 ENCOUNTER — Other Ambulatory Visit: Payer: Self-pay | Admitting: Family Medicine

## 2015-10-28 DIAGNOSIS — E559 Vitamin D deficiency, unspecified: Secondary | ICD-10-CM

## 2015-10-28 DIAGNOSIS — K810 Acute cholecystitis: Secondary | ICD-10-CM

## 2015-10-28 DIAGNOSIS — Z114 Encounter for screening for human immunodeficiency virus [HIV]: Secondary | ICD-10-CM

## 2015-10-28 DIAGNOSIS — R7303 Prediabetes: Secondary | ICD-10-CM

## 2015-10-28 DIAGNOSIS — Z1159 Encounter for screening for other viral diseases: Secondary | ICD-10-CM

## 2015-10-28 DIAGNOSIS — I10 Essential (primary) hypertension: Secondary | ICD-10-CM

## 2015-10-28 DIAGNOSIS — E785 Hyperlipidemia, unspecified: Secondary | ICD-10-CM

## 2015-10-28 DIAGNOSIS — Z125 Encounter for screening for malignant neoplasm of prostate: Secondary | ICD-10-CM

## 2015-10-29 ENCOUNTER — Telehealth: Payer: Self-pay

## 2015-10-29 ENCOUNTER — Other Ambulatory Visit (INDEPENDENT_AMBULATORY_CARE_PROVIDER_SITE_OTHER): Payer: Medicare Other

## 2015-10-29 ENCOUNTER — Other Ambulatory Visit: Payer: Self-pay | Admitting: Family Medicine

## 2015-10-29 ENCOUNTER — Ambulatory Visit (INDEPENDENT_AMBULATORY_CARE_PROVIDER_SITE_OTHER): Payer: Medicare Other

## 2015-10-29 VITALS — BP 100/68 | HR 54 | Temp 97.4°F | Ht 70.0 in | Wt 195.0 lb

## 2015-10-29 DIAGNOSIS — Z1159 Encounter for screening for other viral diseases: Secondary | ICD-10-CM

## 2015-10-29 DIAGNOSIS — E785 Hyperlipidemia, unspecified: Secondary | ICD-10-CM

## 2015-10-29 DIAGNOSIS — I10 Essential (primary) hypertension: Secondary | ICD-10-CM

## 2015-10-29 DIAGNOSIS — K810 Acute cholecystitis: Secondary | ICD-10-CM | POA: Diagnosis not present

## 2015-10-29 DIAGNOSIS — Z125 Encounter for screening for malignant neoplasm of prostate: Secondary | ICD-10-CM

## 2015-10-29 DIAGNOSIS — Z114 Encounter for screening for human immunodeficiency virus [HIV]: Secondary | ICD-10-CM | POA: Diagnosis not present

## 2015-10-29 DIAGNOSIS — E559 Vitamin D deficiency, unspecified: Secondary | ICD-10-CM

## 2015-10-29 DIAGNOSIS — Z0000 Encounter for general adult medical examination without abnormal findings: Secondary | ICD-10-CM

## 2015-10-29 DIAGNOSIS — R7303 Prediabetes: Secondary | ICD-10-CM | POA: Diagnosis not present

## 2015-10-29 LAB — CBC WITH DIFFERENTIAL/PLATELET
Basophils Absolute: 0 10*3/uL (ref 0.0–0.1)
Basophils Relative: 0.4 % (ref 0.0–3.0)
EOS PCT: 2.3 % (ref 0.0–5.0)
Eosinophils Absolute: 0.2 10*3/uL (ref 0.0–0.7)
HCT: 40.8 % (ref 39.0–52.0)
Hemoglobin: 13.6 g/dL (ref 13.0–17.0)
LYMPHS ABS: 2.6 10*3/uL (ref 0.7–4.0)
Lymphocytes Relative: 31.2 % (ref 12.0–46.0)
MCHC: 33.5 g/dL (ref 30.0–36.0)
MCV: 95.6 fl (ref 78.0–100.0)
MONO ABS: 0.5 10*3/uL (ref 0.1–1.0)
Monocytes Relative: 6 % (ref 3.0–12.0)
NEUTROS ABS: 5.1 10*3/uL (ref 1.4–7.7)
Neutrophils Relative %: 60.1 % (ref 43.0–77.0)
PLATELETS: 268 10*3/uL (ref 150.0–400.0)
RBC: 4.27 Mil/uL (ref 4.22–5.81)
RDW: 14.3 % (ref 11.5–15.5)
WBC: 8.5 10*3/uL (ref 4.0–10.5)

## 2015-10-29 LAB — LIPID PANEL
CHOL/HDL RATIO: 3
Cholesterol: 134 mg/dL (ref 0–200)
HDL: 52.4 mg/dL (ref >39.00)
LDL Cholesterol: 62 mg/dL (ref 0–99)
NONHDL: 81.88
TRIGLYCERIDES: 97 mg/dL (ref 0.0–149.0)
VLDL: 19.4 mg/dL (ref 0.0–40.0)

## 2015-10-29 LAB — VITAMIN D 25 HYDROXY (VIT D DEFICIENCY, FRACTURES): VITD: 24.7 ng/mL — ABNORMAL LOW (ref 30.00–100.00)

## 2015-10-29 LAB — HEMOGLOBIN A1C: HEMOGLOBIN A1C: 5.9 % (ref 4.6–6.5)

## 2015-10-29 LAB — BASIC METABOLIC PANEL
BUN: 17 mg/dL (ref 6–23)
CHLORIDE: 105 meq/L (ref 96–112)
CO2: 31 meq/L (ref 19–32)
CREATININE: 0.6 mg/dL (ref 0.40–1.50)
Calcium: 9.5 mg/dL (ref 8.4–10.5)
GFR: 143.42 mL/min (ref >60.00)
GLUCOSE: 89 mg/dL (ref 70–99)
Potassium: 4.4 mEq/L (ref 3.5–5.1)
Sodium: 140 mEq/L (ref 135–145)

## 2015-10-29 LAB — PSA, MEDICARE: PSA: 0.61 ng/ml (ref 0.10–4.00)

## 2015-10-29 LAB — HIV ANTIBODY (ROUTINE TESTING W REFLEX): HIV: NONREACTIVE

## 2015-10-29 NOTE — Telephone Encounter (Signed)
Pt was in today for AWV. Pt requested a trial generic version of Viagra.

## 2015-10-29 NOTE — Progress Notes (Signed)
I reviewed health advisor's note, was available for consultation, and agree with documentation and plan.  

## 2015-10-29 NOTE — Progress Notes (Signed)
Subjective:   Alexander [name] is a 65 y.o. male who presents for Medicare Annual/Subsequent preventive examination.  Review of Systems:  N/A Cardiac Risk Factors include: advanced age (>37men, >41 women);male gender;hypertension;dyslipidemia     Objective:    Vitals: BP 100/68 mmHg  Pulse 54  Temp(Src) 97.4 F (36.3 C) (Oral)  Ht 5\' 10"  (1.778 m)  Wt 195 lb (88.451 kg)  BMI 27.98 kg/m2  SpO2 97%  Body mass index is 27.98 kg/(m^2).  Tobacco History  Smoking status  . Never Smoker   Smokeless tobacco  . Never Used     Counseling given: No   Past Medical History  Diagnosis Date  . NSTEMI (non-ST elevated myocardial infarction) ([place]) 04/2015    - Demand Ischemia in setting of Sepsis (also with prior MI history  . CAD S/P percutaneous coronary angioplasty 1995, 2000, 2005    a. 1995 s/p BMS;  b. 2000 s/p BMS;  c. 2005 s/p stent - All stents in [facility] (RCA, LAD & OM - unknown on which date);  d. 04/2015 NSTEMI/Cath: LM nl, ost/p LAD 20%, patent mLAD stent, RI small, OM2 patent stent, pRCA 20%, patent stent, EF 45-50%-->Med Rx.  . Ischemic cardiomyopathy     a. 04/2015 Echo: EF 40-45%, mod antsept, ant, antlat, apical HK, mild AI/MR, PASP 95mmHg.  Marland Kitchen PAF (paroxysmal atrial fibrillation) ([place])     a. 04/2015 in setting of cholecystits and sepsis -->Amio;  b. CHA2DS2VASc = 4--> Eliquis.  . Essential hypertension   . HLD (hyperlipidemia)   . History of post poliomyelitis muscular atrophy   . Post-polio syndrome     a. ambulates with braces.  . Neuropathy ([place])   . OSA (obstructive sleep apnea)     does not want to use CPAP, using mouth guard  . Chronic pain syndrome     a. Followed @ [facility];  b. Uses 12-14 excedrins per day.  . Pneumonia 04/2015  . Neuropathy ([place])     due to post polio syndrome  . Migraines   . History of depression   . Arthritis   . Shoulder pain, right    Past Surgical History  Procedure Laterality Date  . Uvulopalatopharyngoplasty,  tonsillectomy and septoplasty  2002  . Lumbar spine stimulator    . Cervical spine stimulator  08/2012  . Colonoscopy  09/2013    TA x2, rpt 5 yrs (Pyrt[facility])  . Coronary angioplasty with stent placement  1995, 2000, 2005    stents in RCA, LAD & Cx-OM.  Marland Kitchen Cardiac catheterization N/A 04/30/2015    Procedure: Left Heart Cath and Coronary Angiography;  Surgeon: Troy Sine, MD;  Location: [place] [facility];  Service: Cardiovascular; LM nl, ost/p LAD 20%, patent mLAD stent, RI small, OM2 patent stent, pRCA 20%, patent stent, EF 45-50%-->Med Rx   . Cholecystectomy  06/10/2015    Procedure: LAPAROSCOPIC CHOLECYSTECTOMY;  Surgeon: Mickeal Skinner, MD;  [facility];  Service: General;;   Family History  Problem Relation Age of Onset  . Diabetes Mother   . Cancer Sister 69    male cancer  . Coronary artery disease Neg Hx   . Stroke Neg Hx   . Colon cancer Neg Hx   . Pancreatic cancer Neg Hx   . Stomach cancer Neg Hx   . Cancer Brother 34    breast   History  Sexual Activity  . Sexual Activity: Yes    Outpatient Encounter Prescriptions as of 10/29/2015  Medication Sig  . aspirin 81 MG chewable tablet Chew 1 tablet (81 mg total) by mouth daily.  Marland Kitchen atorvastatin (LIPITOR) 80 MG tablet Take 1 tablet (80 mg total) by mouth daily at 6 PM.  . Cholecalciferol (CVS VITAMIN D) 2000 UNITS CAPS Take 1 capsule (2,000 Units total) by mouth daily.  Marland Kitchen gabapentin (NEURONTIN) 600 MG tablet TAKE 2 TABLETS THREE TIMES A DAY  . lisinopril (PRINIVIL,ZESTRIL) 2.5 MG tablet Take 1 tablet (2.5 mg total) by mouth daily.  . methocarbamol (ROBAXIN) 500 MG tablet TAKE 1 TABLET THREE TIMES A DAY AS NEEDED FOR MUSCLE SPASM  . metoprolol tartrate (LOPRESSOR) 25 MG tablet Take 0.5 tablets (12.5 mg total) by mouth 2 (two) times daily.  . Multiple Vitamins-Minerals (MULTIVITAMIN ADULTS 50+ PO) Take 1 tablet by mouth daily.  . Omega-3 Fatty Acids (FISH OIL) 1000 MG CAPS Take 1 capsule by mouth daily.  .  rivaroxaban (XARELTO) 20 MG TABS tablet Take 1 tablet (20 mg total) by mouth daily with supper.  . sildenafil (VIAGRA) 100 MG tablet Take 0.5-1 tablets (50-100 mg total) by mouth daily as needed for erectile dysfunction.  . vitamin C (ASCORBIC ACID) 500 MG tablet Take 1,000 mg by mouth daily.  . vitamin E 400 UNIT capsule Take 400 Units by mouth daily.   No facility-administered encounter medications on file as of 10/29/2015.    Activities of Daily Living In your present state of health, do you have any difficulty performing the following activities: 10/29/2015 06/11/2015  Hearing? N N  Vision? N N  Difficulty concentrating or making decisions? N N  Walking or climbing stairs? Y Y  Dressing or bathing? N N  Doing errands, shopping? N -  Preparing Food and eating ? N -  Using the Toilet? N -  In the past six months, have you accidently leaked urine? N -  Do you have problems with loss of bowel control? N -  Managing your Medications? N -  Managing your Finances? N -  Housekeeping or managing your Housekeeping? N -    Patient Care Team: [name], MD as PCP - General (Family Medicine) Leonie Man, MD as Consulting Physician (Cardiology) Wilhelmina Mcardle, MD as Consulting Physician (Pulmonary Disease)   Assessment:      Hearing Screening   125Hz  250Hz  500Hz  1000Hz  2000Hz  4000Hz  8000Hz   Right ear:   40 40 40 40   Left ear:   40 40 40 40   Vision Screening Comments: Last eye exam at [facility] and Dietary recommendations Current Exercise Habits: Home exercise routine, Type of exercise: Other - see comments;strength training/weights (yard and house work; upper body exercises), Time (Minutes): 45, Frequency (Times/Week): 7, Weekly Exercise (Minutes/Week): 315, Intensity: Mild, Exercise limited by: orthopedic condition(s)  Goals    . Increase physical activity     Starting 10/29/2015, I will continue to exercise and do yard work for 30-60 min daily.         Fall Risk Fall Risk  10/29/2015 07/12/2014 06/29/2013  Falls in the past year? Yes No Yes  Number falls in past yr: 2 or more - 2 or more  Injury with Fall? No - No  Risk Factor Category  High Fall Risk - High Fall Risk  Risk for fall due to : - - Impaired balance/gait  Risk for fall due to (comments): - - Post-polio syndrome; uses bilateral leg braces  Follow up Falls evaluation completed - -   Depression  Screen PHQ 2/9 Scores 10/29/2015 07/12/2014 06/29/2013  PHQ - 2 Score 0 0 0    Cognitive Testing MMSE - Mini Mental State Exam 10/29/2015  Orientation to time 5  Orientation to Place 5  Registration 3  Attention/ Calculation 0  Recall 3  Language- name 2 objects 0  Language- repeat 1  Language- follow 3 step command 3  Language- read & follow direction 0  Write a sentence 0  Copy design 0  Total score 20   PLEASE NOTE: A Mini-Cog screen was completed. Maximum score is 20. A value of 0 denotes this part of [place] MMSE was not completed or the patient failed this part of the Mini-Cog screening.   Mini-Cog Screening Orientation to Time - Max 5 pts Orientation to Place - Max 5 pts Registration - Max 3 pts Recall - Max 3 pts Language Repeat - Max 1 pts Language Follow 3 Step Command - Max 3 pts  Immunization History  Administered Date(s) Administered  . Influenza, Seasonal, Injecte, Preservative Fre 05/03/2012, 05/05/2014, 02/28/2015  . Influenza,inj,Quad PF,36+ Mos 02/27/2013  . Pneumococcal Conjugate-13 04/28/2015  . Td 05/03/2012  . Zoster 06/29/2013   Screening Tests Health Maintenance  Topic Date Due  . INFLUENZA VACCINE  12/31/2015  . PNA vac Low Risk Adult (2 of 2 - PPSV23) 04/27/2016  . COLONOSCOPY  10/20/2018  . TETANUS/TDAP  05/03/2022  . DTaP/Tdap/Td  Completed  . ZOSTAVAX  Completed  . Hepatitis C Screening  Completed  . HIV Screening  Completed      Plan:     I have personally reviewed and addressed the Medicare Annual Wellness questionnaire and  have noted the following in the patient's chart:  A. Medical and social history B. Use of alcohol, tobacco or illicit drugs  C. Current medications and supplements D. Functional ability and status E.  Nutritional status F.  Physical activity G. Advance directives H. List of other physicians I.  Hospitalizations, surgeries, and ER visits in previous 12 months J.  [place] to include hearing, vision, cognitive, depression L. Referrals and appointments - none  In addition, I have reviewed and discussed with patient certain preventive protocols, quality metrics, and best practice recommendations. A written personalized care plan for preventive services as well as general preventive health recommendations were provided to patient.  See attached scanned questionnaire for additional information.   Signed,   Lindell Noe, MHA, BS, LPN Health Advisor

## 2015-10-29 NOTE — Patient Instructions (Addendum)
Alexander [name] , Thank you for taking time to come for your Medicare Wellness Visit. I appreciate your ongoing commitment to your health goals. Please review the following plan we discussed and let me know if I can assist you in the future.   These are the goals we discussed: Goals    . Increase physical activity     Starting 10/29/2015, I will continue to exercise and do yard work for 30-60 min daily.        This is a list of the screening recommended for you and due dates:  Health Maintenance  Topic Date Due  . Flu Shot  12/31/2015  . Pneumonia vaccines (2 of 2 - PPSV23) 04/27/2016  . Colon Cancer Screening  10/20/2018  . Tetanus Vaccine  05/03/2022  . DTaP/Tdap/Td vaccine  Completed  . Shingles Vaccine  Completed  .  Hepatitis C: One time screening is recommended by Center for Disease Control  (CDC) for  adults born from [birth year] through 1966.   Completed  . HIV Screening  Completed    Preventive Care for Adults  A healthy lifestyle and preventive care can promote health and wellness. Preventive health guidelines for adults include the following key practices.  . A routine yearly physical is a good way to check with your health care provider about your health and preventive screening. It is a chance to share any concerns and updates on your health and to receive a thorough exam.  . Visit your dentist for a routine exam and preventive care every 6 months. Brush your teeth twice a day and floss once a day. Good oral hygiene prevents tooth decay and gum disease.  . The frequency of eye exams is based on your age, health, family medical history, use  of contact lenses, and other factors. Follow your health care provider's ecommendations for frequency of eye exams.  . Eat a healthy diet. Foods like vegetables, fruits, whole grains, low-fat dairy products, and lean protein foods contain the nutrients you need without too many calories. Decrease your intake of foods high in solid fats, added  sugars, and salt. Eat the right amount of calories for you. Get information about a proper diet from your health care provider, if necessary.  . Regular physical exercise is one of the most important things you can do for your health. Most adults should get at least 150 minutes of moderate-intensity exercise (any activity that increases your heart rate and causes you to sweat) each week. In addition, most adults need muscle-strengthening exercises on 2 or more days a week.  Silver Sneakers may be a benefit available to you. To determine eligibility, you may visit the website: www.silversneakers.com or contact program at [phone] Mon-Fri between 8AM-8PM.   . Maintain a healthy weight. The body mass index (BMI) is a screening tool to identify possible weight problems. It provides an estimate of body fat based on height and weight. Your health care provider can find your BMI and can help you achieve or maintain a healthy weight.   For adults 20 years and older: ? A BMI below 18.5 is considered underweight. ? A BMI of 18.5 to 24.9 is normal. ? A BMI of 25 to 29.9 is considered overweight. ? A BMI of 30 and above is considered obese.   . Maintain normal blood lipids and cholesterol levels by exercising and minimizing your intake of saturated fat. Eat a balanced diet with plenty of fruit and vegetables. Blood tests for lipids and cholesterol should  begin at age 27 and be repeated every 5 years. If your lipid or cholesterol levels are high, you are over 50, or you are at high risk for heart disease, you may need your cholesterol levels checked more frequently. Ongoing high lipid and cholesterol levels should be treated with medicines if diet and exercise are not working.  . If you smoke, find out from your health care provider how to quit. If you do not use tobacco, please do not start.  . If you choose to drink alcohol, please do not consume more than 2 drinks per day. One drink is considered to  be 12 ounces (355 mL) of beer, 5 ounces (148 mL) of wine, or 1.5 ounces (44 mL) of liquor.  . If you are 39-75 years old, ask your health care provider if you should take aspirin to prevent strokes.  . Use sunscreen. Apply sunscreen liberally and repeatedly throughout the day. You should seek shade when your shadow is shorter than you. Protect yourself by wearing long sleeves, pants, a wide-brimmed hat, and sunglasses year round, whenever you are outdoors.  . Once a month, do a whole body skin exam, using a mirror to look at the skin on your back. Tell your health care provider of new moles, moles that have irregular borders, moles that are larger than a pencil eraser, or moles that have changed in shape or color.

## 2015-10-29 NOTE — Progress Notes (Signed)
PCP notes:  Health maintenance:  Hep C screening - completed HIV screening - completed  Abnormal screenings:  Fall risk - hx of multiple falls without injury within previous 12 months  Patient concerns: Pt has concerns with affording one of his medications. This concern was shared with PCP during encounter.  Nurse concerns: None  Next PCP appt: 11/07/15 @ 1245

## 2015-10-29 NOTE — Progress Notes (Signed)
Pre visit review using our clinic review tool, if applicable. No additional management support is needed unless otherwise documented below in the visit note. 

## 2015-10-30 LAB — HEPATITIS C ANTIBODY: HCV Ab: NEGATIVE

## 2015-10-30 MED ORDER — SILDENAFIL CITRATE 20 MG PO TABS: ORAL_TABLET | ORAL | Status: DC

## 2015-10-30 NOTE — Telephone Encounter (Signed)
plz notify generic viagra sent to pharmacy to price out.

## 2015-10-30 NOTE — Telephone Encounter (Signed)
Patient notified

## 2015-11-07 ENCOUNTER — Encounter: Payer: Self-pay | Admitting: Family Medicine

## 2015-11-07 ENCOUNTER — Ambulatory Visit (INDEPENDENT_AMBULATORY_CARE_PROVIDER_SITE_OTHER): Payer: Medicare Other | Admitting: Family Medicine

## 2015-11-07 VITALS — BP 106/70 | HR 72 | Temp 98.1°F | Ht 70.0 in

## 2015-11-07 DIAGNOSIS — E785 Hyperlipidemia, unspecified: Secondary | ICD-10-CM

## 2015-11-07 DIAGNOSIS — I480 Paroxysmal atrial fibrillation: Secondary | ICD-10-CM | POA: Diagnosis not present

## 2015-11-07 DIAGNOSIS — G14 Postpolio syndrome: Secondary | ICD-10-CM | POA: Diagnosis not present

## 2015-11-07 DIAGNOSIS — I255 Ischemic cardiomyopathy: Secondary | ICD-10-CM | POA: Diagnosis not present

## 2015-11-07 DIAGNOSIS — I10 Essential (primary) hypertension: Secondary | ICD-10-CM | POA: Diagnosis not present

## 2015-11-07 DIAGNOSIS — E559 Vitamin D deficiency, unspecified: Secondary | ICD-10-CM

## 2015-11-07 DIAGNOSIS — R7303 Prediabetes: Secondary | ICD-10-CM

## 2015-11-07 MED ORDER — METHOCARBAMOL 500 MG PO TABS: ORAL_TABLET | ORAL | Status: DC

## 2015-11-07 MED ORDER — SILDENAFIL CITRATE 20 MG PO TABS: ORAL_TABLET | ORAL | Status: DC

## 2015-11-07 NOTE — Assessment & Plan Note (Signed)
Minimal - continue low dose ACEI and beta blocker.

## 2015-11-07 NOTE — Assessment & Plan Note (Signed)
Has not been regular with this - rec restart 2000 IU daily.

## 2015-11-07 NOTE — Progress Notes (Signed)
BP 106/70 mmHg  Pulse 72  Temp(Src) 98.1 F (36.7 C) (Oral)  Ht 5\' 10"  (1.778 m)  Wt   SpO2 95%   CC: f/u visit  Subjective:    Patient ID: Alexander [name], male    DOB: 1950/07/31, 65 y.o.   MRN: [id number]  HPI: Alexander [name] is a 65 y.o. male presenting on 11/07/2015 for Annual Exam   Medicare wellness visit with Katha Cabal last week, note reviewed.  Ongoing fall risk due to post-polio syndrome.  Recent hospitalization earlier this year for cholecystitis with sepsis s/p cholecystectomy with post op afib - now off amiodarone. xarelto was continued due to high risk of stroke (chadsvasc2 = 4). Also found to likely have had prior LAD MI.   Had eval by Dr Alva Garnet - overall stable. Amiodarone was stopped.   All chart notes reviewed since I last saw patient (12/2014).  Relevant past medical, surgical, family and social history reviewed and updated as indicated. Interim medical history since our last visit reviewed. Allergies and medications reviewed and updated. Current Outpatient Prescriptions on File Prior to Visit  Medication Sig  . aspirin 81 MG chewable tablet Chew 1 tablet (81 mg total) by mouth daily.  Marland Kitchen atorvastatin (LIPITOR) 80 MG tablet Take 1 tablet (80 mg total) by mouth daily at 6 PM.  . Cholecalciferol ([employer] VITAMIN D) 2000 UNITS CAPS Take 1 capsule (2,000 Units total) by mouth daily.  Marland Kitchen gabapentin (NEURONTIN) 600 MG tablet TAKE 2 TABLETS THREE TIMES A DAY  . lisinopril (PRINIVIL,ZESTRIL) 2.5 MG tablet Take 1 tablet (2.5 mg total) by mouth daily.  . metoprolol tartrate (LOPRESSOR) 25 MG tablet Take 0.5 tablets (12.5 mg total) by mouth 2 (two) times daily.  . Multiple Vitamins-Minerals (MULTIVITAMIN ADULTS 50+ PO) Take 1 tablet by mouth daily.  . Omega-3 Fatty Acids (FISH OIL) 1000 MG CAPS Take 1 capsule by mouth daily.  . rivaroxaban (XARELTO) 20 MG TABS tablet Take 1 tablet (20 mg total) by mouth daily with supper.  . vitamin C (ASCORBIC ACID) 500 MG tablet Take 1,000 mg by  mouth daily.  . vitamin E 400 UNIT capsule Take 400 Units by mouth daily.   No current facility-administered medications on file prior to visit.    Review of Systems Per HPI unless specifically indicated in ROS section     Objective:    BP 106/70 mmHg  Pulse 72  Temp(Src) 98.1 F (36.7 C) (Oral)  Ht 5\' 10"  (1.778 m)  Wt   SpO2 95%  Wt Readings from Last 3 Encounters:  10/29/15 195 lb (88.451 kg)  09/02/15 186 lb (84.369 kg)  08/27/15 182 lb (82.555 kg)    Physical Exam  Constitutional: He appears well-developed and well-nourished. No distress.  HENT:  Mouth/Throat: Oropharynx is clear and moist. No oropharyngeal exudate.  Eyes: Conjunctivae and EOM are normal. Pupils are equal, round, and reactive to light. No scleral icterus.  Neck: Normal range of motion. Neck supple.  Cardiovascular: Normal rate, regular rhythm, normal heart sounds and intact distal pulses.   No murmur heard. Sounds regular today  Pulmonary/Chest: Effort normal and breath sounds normal. No respiratory distress. He has no wheezes. He has no rales.  Abdominal: Soft. Normal appearance and bowel sounds are normal. He exhibits no distension and no mass. There is no hepatosplenomegaly. There is no tenderness. There is no rebound, no guarding and no CVA tenderness.  Musculoskeletal: He exhibits no edema.  Leges in braces  Lymphadenopathy:    He has no  cervical adenopathy.  Skin: Skin is warm and dry. No rash noted.  Psychiatric: He has a normal mood and affect.  Nursing note and vitals reviewed.  Results for orders placed or performed in visit on 10/29/15  Hepatitis C antibody  Result Value Ref Range   HCV Ab NEGATIVE NEGATIVE      Assessment & Plan:   Problem List Items Addressed This Visit    Hyperlipidemia with target LDL less than 70 (Chronic)    Wonderful control on lipitor 80mg  daily - continue.       Relevant Medications   sildenafil (REVATIO) 20 MG tablet   Essential hypertension  (Chronic)    Minimal - continue low dose ACEI and beta blocker.      Relevant Medications   sildenafil (REVATIO) 20 MG tablet   PAF (paroxysmal atrial fibrillation) ([place]) - Primary (Chronic)    Sounds regular today. Continues xarelto. Off amiodarone, only on low dose metoprolol. Continue.       Relevant Medications   sildenafil (REVATIO) 20 MG tablet   Post-polio syndrome   Prediabetes    Discussed diet changes to keep good glycemic control      Vitamin D deficiency    Has not been regular with this - rec restart 2000 IU daily.          Follow up plan: Return in about 6 months (around 05/08/2016), or as needed, for follow up visit.  [name], MD

## 2015-11-07 NOTE — Assessment & Plan Note (Signed)
Discussed diet changes to keep good glycemic control

## 2015-11-07 NOTE — Patient Instructions (Addendum)
Restart vitamin D 2000 units daily. No need for calcium.  Good to see you today, call us with questions. Return as needed or in 6 months for follow up.

## 2015-11-07 NOTE — Assessment & Plan Note (Signed)
Sounds regular today. Continues xarelto. Off amiodarone, only on low dose metoprolol. Continue.

## 2015-11-07 NOTE — Assessment & Plan Note (Signed)
Wonderful control on lipitor 80mg  daily - continue.

## 2016-02-11 ENCOUNTER — Telehealth: Payer: Self-pay

## 2016-02-11 MED ORDER — SILDENAFIL CITRATE 100 MG PO TABS: 50.0000 mg | ORAL_TABLET | Freq: Every day | ORAL | 11 refills | Status: DC | PRN

## 2016-02-11 NOTE — Telephone Encounter (Signed)
Name brand viagra sent to pharmacy.

## 2016-02-11 NOTE — Telephone Encounter (Signed)
Pt said the generic viagra is not effective; it is missing something and pt request namebrand viagra to [facility] [place]. Pt request cb when done.

## 2016-02-12 NOTE — Telephone Encounter (Signed)
Patient notified

## 2016-03-01 DIAGNOSIS — K810 Acute cholecystitis: Secondary | ICD-10-CM

## 2016-03-01 HISTORY — DX: Acute cholecystitis: K81.0

## 2016-03-04 DIAGNOSIS — Z23 Encounter for immunization: Secondary | ICD-10-CM | POA: Diagnosis not present

## 2016-03-06 ENCOUNTER — Encounter: Payer: Self-pay | Admitting: Cardiology

## 2016-03-06 ENCOUNTER — Ambulatory Visit (INDEPENDENT_AMBULATORY_CARE_PROVIDER_SITE_OTHER): Payer: Medicare Other | Admitting: Cardiology

## 2016-03-06 VITALS — BP 120/70 | HR 54 | Ht 71.0 in | Wt 196.0 lb

## 2016-03-06 DIAGNOSIS — I2510 Atherosclerotic heart disease of native coronary artery without angina pectoris: Secondary | ICD-10-CM | POA: Diagnosis not present

## 2016-03-06 DIAGNOSIS — I10 Essential (primary) hypertension: Secondary | ICD-10-CM | POA: Diagnosis not present

## 2016-03-06 DIAGNOSIS — I255 Ischemic cardiomyopathy: Secondary | ICD-10-CM

## 2016-03-06 DIAGNOSIS — Z9861 Coronary angioplasty status: Secondary | ICD-10-CM

## 2016-03-06 DIAGNOSIS — E785 Hyperlipidemia, unspecified: Secondary | ICD-10-CM

## 2016-03-06 DIAGNOSIS — I480 Paroxysmal atrial fibrillation: Secondary | ICD-10-CM

## 2016-03-06 NOTE — Patient Instructions (Signed)
NO CHANGES WITH CURRENT MEDICATIONS   Your physician wants you to follow-up in 12 MONTHS WITH DR HARDING. You will receive a reminder letter in the mail two months in advance. If you don't receive a letter, please call our office to schedule the follow-up appointment.   If you need a refill on your cardiac medications before your next appointment, please call your pharmacy.  

## 2016-03-06 NOTE — Progress Notes (Signed)
PCP: [name], MD  Clinic Note: Chief Complaint  Patient presents with  . Follow-up    See problem list below   1. CAD S/P percutaneous coronary angioplasty   2. PAF (paroxysmal atrial fibrillation) ([place])   3. Ischemic cardiomyopathy   4. Essential hypertension   5. Hyperlipidemia with target LDL less than 70     HPI: Alexander [name] is a 65 y.o. male with a PMH below (Notable for recent hospitalization with cholecystitis, acute by sepsis with atrial fibrillation then non-ST elevation MI) who presents today for 3 month follow-up and post surgical follow-up. He does have a relatively long history of CAD dating back to 1995. 04/2015: Sepsis from Cholecystitis --> Septic --> Afib RVR with + Troponin & reduced LVEF --> DCCV & Amiod, Cath revealed patent stents & some improvement in EF. He is disabled and walks with 2 canes, he has bilateral leg braces but despite this he still walks around his backyard. To make a full circuit of the backyard he wants to go downhill part of the way.  Alexander [name] was last seen in April -- stopped Amio Recent Hospitalizations:  None since cholecystectomy in January.  Studies Reviewed: No recent studies   Interval History: He presents today feeling well without any major complaints.  He really denies any significant symptoms. He had maybe one episode of going short of breath when his allergies are acting up. A lot of it is more related to the mammographic takes for him to move around abusing his 2 canes. Does a lot of effort, so if he has along with a walk, he will be short of breath. But he is able to do all the work he wants to do in the backyard without any chest tightness or pressure. No significant dyspnea. He feels it is getting close to fully recovering from his illness/year. He has not had any symptoms of rapid irregular heartbeats/palpitations. No chest pain or pressure with rest or exertion. He does have some  shortness of breath withvigorous   exertion.  He has not had any heart failure symptoms of PND, orthopnea or edema. Since stopping amiodarone, he has not had any palpitations, lightheadedness, dizziness, weakness or syncope/near syncope. No TIA/amaurosis fugax symptoms. No melena, hematochezia, hematuria, or epstaxis. No claudication.  ROS: A comprehensive was performed. Review of Systems  Constitutional: Negative for malaise/fatigue and weight loss.  Eyes: Negative for blurred vision.  Respiratory: Negative for cough, sputum production and wheezing.   Cardiovascular: Negative for claudication.  Gastrointestinal: Negative for abdominal pain, blood in stool, constipation, heartburn and melena.  Musculoskeletal:       Walks with bilateral braces and 2 canes  Neurological: Negative for headaches (No more than his usual).  Endo/Heme/Allergies: Does not bruise/bleed easily.  Psychiatric/Behavioral: Negative.   All other systems reviewed and are negative.   Past Medical History:  Diagnosis Date  . Acute cholecystitis 03/2016   with sepsis  . Arthritis   . CAD S/P percutaneous coronary angioplasty 1995, 2000, 2005   a. 1995 s/p BMS;  b. 2000 s/p BMS;  c. 2005 s/p stent - All stents in [facility] (RCA, LAD & OM - unknown on which date);  d. 04/2015 NSTEMI/Cath: LM nl, ost/p LAD 20%, patent mLAD stent, RI small, OM2 patent stent, pRCA 20%, patent stent, EF 45-50%-->Med Rx.  . Chronic pain syndrome    a. Followed @ [facility];  b. Uses 12-14 excedrins per day.  . Essential hypertension   .  History of depression   . History of post poliomyelitis muscular atrophy   . HLD (hyperlipidemia)   . Ischemic cardiomyopathy    a. 04/2015 Echo: EF 40-45%, mod antsept, ant, antlat, apical HK, mild AI/MR, PASP 16mmHg.  . Migraines   . Neuropathy ([place])   . Neuropathy ([place])    due to post polio syndrome  . NSTEMI (non-ST elevated myocardial infarction) ([place]) 04/2015   - Demand Ischemia in setting of Sepsis (also with prior MI  history  . OSA (obstructive sleep apnea)    does not want to use CPAP, using mouth guard  . PAF (paroxysmal atrial fibrillation) ([place])    a. 04/2015 in setting of cholecystits and sepsis -->Amio;  b. CHA2DS2VASc = 4--> Eliquis.  . Pneumonia 04/2015  . Post-polio syndrome    a. ambulates with braces.  . Shoulder pain, right     Past Surgical History:  Procedure Laterality Date  . CARDIAC CATHETERIZATION N/A 04/30/2015   Procedure: Left Heart Cath and Coronary Angiography;  Surgeon: Troy Sine, MD;  Location: [facility];  Service: Cardiovascular; LM nl, ost/p LAD 20%, patent mLAD stent, RI small, OM2 patent stent, pRCA 20%, patent stent, EF 45-50%-->Med Rx   . cervical spine stimulator  08/2012  . CHOLECYSTECTOMY  06/10/2015   Procedure: LAPAROSCOPIC CHOLECYSTECTOMY;  Surgeon: Mickeal Skinner, MD;  Location: [facility];  Service: General;;  . COLONOSCOPY  09/2013   TA x2, rpt 5 yrs ([place])  . CORONARY ANGIOPLASTY WITH STENT PLACEMENT  1995, 2000, 2005   stents in RCA, LAD & Cx-OM.  Marland Kitchen lumbar spine stimulator    . TRANSTHORACIC ECHOCARDIOGRAM  04/2015   EF 40-45%, mod antsept, ant, antlat, apical HK, mild AI/MR, PASP 85mmHg.  Marland Kitchen UVULOPALATOPHARYNGOPLASTY, TONSILLECTOMY AND SEPTOPLASTY  2002   Prior to Admission medications   Medication Sig Start Date Taking? Authorizing Provider  amiodarone (PACERONE) 200 MG tablet Take 1 tablet (200 mg total) by mouth daily. 06/20/15 Yes Leonie Man, MD  aspirin 81 MG chewable tablet Chew 1 tablet (81 mg total) by mouth daily. 05/02/15 Yes Thurnell Lose, MD  atorvastatin (LIPITOR) 80 MG tablet Take 1 tablet (80 mg total) by mouth daily at 6 PM. 05/30/15 Yes Rogelia Mire, NP  Cholecalciferol (CVS VITAMIN D) 2000 UNITS CAPS Take 1 capsule (2,000 Units total) by mouth daily. 03/29/13 Yes [name], MD  gabapentin (NEURONTIN) 600 MG tablet TAKE 2 TABLETS THREE TIMES A DAY 03/11/15 Yes [name], MD  lisinopril  (PRINIVIL,ZESTRIL) 2.5 MG tablet Take 1 tablet (2.5 mg total) by mouth daily. 05/30/15 Yes Rogelia Mire, NP  methocarbamol (ROBAXIN) 500 MG tablet TAKE 1 TABLET THREE TIMES A DAY AS NEEDED FOR MUSCLE SPASM 07/16/15 Yes [name], MD  metoprolol tartrate (LOPRESSOR) 25 MG tablet Take 0.5 tablets (12.5 mg total) by mouth 2 (two) times daily. 05/30/15 Yes Rogelia Mire, NP  Multiple Vitamins-Minerals (MULTIVITAMIN ADULTS 50+ PO) Take 1 tablet by mouth daily.  Yes Historical Provider, MD  Omega-3 Fatty Acids (FISH OIL) 1000 MG CAPS Take 1 capsule by mouth daily.  Yes Historical Provider, MD  sildenafil (VIAGRA) 100 MG tablet Take 0.5-1 tablets (50-100 mg total) by mouth daily as needed for erectile dysfunction. 01/03/15 Yes [name], MD  vitamin C (ASCORBIC ACID) 500 MG tablet Take 1,000 mg by mouth daily.  Yes Historical Provider, MD  vitamin E 400 UNIT capsule Take 400 Units by mouth daily.  Yes Historical Provider, MD  No Known Allergies   Social History   Social History  . Marital status: Married    Spouse name: N/A  . Number of children: N/A  . Years of education: N/A   Social History Main Topics  . Smoking status: Never Smoker  . Smokeless tobacco: Never Used  . Alcohol use No  . Drug use: No  . Sexual activity: Yes   Other Topics Concern  . 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 Diabetes Mother   . Cancer Sister 68    male cancer  . Cancer Brother 60    breast  . Coronary artery disease Neg Hx   . Stroke Neg Hx   . Colon cancer Neg Hx   . Pancreatic cancer Neg Hx   . Stomach cancer Neg Hx      Wt Readings from  Last 3 Encounters:  03/06/16 88.9 kg (196 lb)  10/29/15 88.5 kg (195 lb)  09/02/15 84.4 kg (186 lb)    PHYSICAL EXAM BP 120/70   Pulse (!) 54   Ht 5\' 11"  (1.803 m)   Wt 88.9 kg (196 lb)   BMI 27.34 kg/m  General appearance: alert, cooperative, appears stated age, no distress and Very robust appearing gentleman who is wearing bilateral leg braces to the thighs and walks with 2 canes because of postpolio syndrome. But he has very robust upper body strength Neck: no adenopathy, no carotid bruit and no JVD Lungs:CTA B, normal percussion bilaterally and non-labored Heart:Somewhat bradycardicewith normal rhythm but some ectopy. Normal S1 and S2.  No murmur, click, rub or gallop  Abdomen: soft, non-tender; bowel sounds normal; no masses,  no HSM; no HJR. No more tenderness in RUQ Extremities: extremities normal, atraumatic, no cyanosis, and edema; bilateral leg braces  Pulses: 2+ and symmetric;  Skin: mobility and turgor normal  Neurologic: Mental status: Alert, oriented, thought content appropriate Cranial nerves: normal (II-XII grossly intact)  Bilateral Leg braces - bilateral canes.   Adult ECG Report  Rate: 54;  Rhythm: sinus bradycardia;. Otherwise normal axis and intervals/durations. Normal voltage.  Narrative Interpretation: Stable EKG   Other studies Reviewed: Additional studies/ records that were reviewed today include:  Recent Labs:  Due to get checked by PCP Lab Results  Component Value Date   CHOL 134 10/29/2015   HDL 52.40 10/29/2015   LDLCALC 62 10/29/2015   LDLDIRECT 213.1 06/29/2013   TRIG 97.0 10/29/2015   CHOLHDL 3 10/29/2015    ASSESSMENT / PLAN: Problem List Items Addressed This Visit    PAF (paroxysmal atrial fibrillation) ([place]); CHA2DS2VASc = 4--> Eliquis. (Chronic)    Normal sinus rhythm now. As far as I can tell, no recurrent episodes since being off amiodarone. His only requiring low-dose beta blocker for rate control. Is on Xarelto for  anticoagulation      Relevant Orders   EKG 12-Lead   Ischemic cardiomyopathy (Chronic)    Mild to moderately reduced EF by LV gram. With no active heart failure symptoms, no need to reevaluate. . No active heart failure symptoms. No diuretic. On stable dose of beta blocker and ACE inhibitor.  Relevant Orders   EKG 12-Lead   Hyperlipidemia with target LDL less than 70 (Chronic)    Has been well controlled on current dose of atorvastatin. Labs from May looked well controlled, within goal..      Relevant Orders   EKG 12-Lead   Essential hypertension (Chronic)    Well-controlled on current dose of ACE inhibitor and beta blocker. No change.      Relevant Orders   EKG 12-Lead   CAD S/P percutaneous coronary angioplasty - Primary (Chronic)    Widely patent stents back in November last year. No active anginal symptoms.  Plan: Continue aspirin, statin, ACE inhibitor and beta blocker.       Other Visit Diagnoses   None.     Current medicines are reviewed at length with the patient today. (+/- concerns) How long does need to take amiodarone. Will he need to continue to take ELIQUIS.  The following changes have been made:    Studies Ordered:   Orders Placed This Encounter  Procedures  . EKG 12-Lead      Glenetta Hew, M.D., M.S. Interventional Cardiologist   Pager # [phone] Phone # [phone] [place], [place] [address]

## 2016-03-08 ENCOUNTER — Encounter: Payer: Self-pay | Admitting: Cardiology

## 2016-03-08 NOTE — Assessment & Plan Note (Signed)
Normal sinus rhythm now. As far as I can tell, no recurrent episodes since being off amiodarone. His only requiring low-dose beta blocker for rate control. Is on Xarelto for anticoagulation

## 2016-03-08 NOTE — Assessment & Plan Note (Signed)
Well-controlled on current dose of ACE inhibitor and beta blocker. No change.

## 2016-03-08 NOTE — Assessment & Plan Note (Signed)
Has been well controlled on current dose of atorvastatin. Labs from May looked well controlled, within goal..

## 2016-03-08 NOTE — Assessment & Plan Note (Signed)
Mild to moderately reduced EF by LV gram. With no active heart failure symptoms, no need to reevaluate. . No active heart failure symptoms. No diuretic. On stable dose of beta blocker and ACE inhibitor.

## 2016-03-08 NOTE — Assessment & Plan Note (Signed)
Widely patent stents back in November last year. No active anginal symptoms.  Plan: Continue aspirin, statin, ACE inhibitor and beta blocker.

## 2016-03-12 ENCOUNTER — Other Ambulatory Visit: Payer: Self-pay | Admitting: Family Medicine

## 2016-03-30 ENCOUNTER — Other Ambulatory Visit: Payer: Self-pay | Admitting: Nurse Practitioner

## 2016-03-31 ENCOUNTER — Other Ambulatory Visit: Payer: Self-pay

## 2016-03-31 MED ORDER — ATORVASTATIN CALCIUM 80 MG PO TABS: 80.0000 mg | ORAL_TABLET | Freq: Every day | ORAL | 3 refills | Status: DC

## 2016-05-29 ENCOUNTER — Other Ambulatory Visit: Payer: Self-pay | Admitting: Nurse Practitioner

## 2016-06-08 ENCOUNTER — Other Ambulatory Visit: Payer: Self-pay | Admitting: Nurse Practitioner

## 2016-08-14 ENCOUNTER — Telehealth: Payer: Self-pay | Admitting: Family Medicine

## 2016-08-14 NOTE — Telephone Encounter (Signed)
Dropped off application to renew disability placard. Placed in [place] tower. [place]

## 2016-08-14 NOTE — Telephone Encounter (Signed)
Received and in physician's basket awaiting signature.

## 2016-08-17 NOTE — Telephone Encounter (Signed)
Patient's wife notified form is ready for pick up. 

## 2016-09-28 ENCOUNTER — Other Ambulatory Visit: Payer: Self-pay | Admitting: Family Medicine

## 2016-09-28 NOTE — Telephone Encounter (Signed)
Pt overdue for f/u, but also due for mcr wellness and cpx in 1 month. Message sent to [facility] to get pt scheduled for this.

## 2016-09-30 ENCOUNTER — Telehealth: Payer: Self-pay | Admitting: Family Medicine

## 2016-09-30 NOTE — Telephone Encounter (Signed)
Scheduled 10/30/16

## 2016-09-30 NOTE — Telephone Encounter (Signed)
Spoke to pt. He will call me back to schedule AWV and CPE. Pt not due until after 10/28/16

## 2016-10-09 ENCOUNTER — Ambulatory Visit: Payer: Medicare Other

## 2016-10-29 ENCOUNTER — Other Ambulatory Visit: Payer: Self-pay | Admitting: Family Medicine

## 2016-10-29 DIAGNOSIS — E785 Hyperlipidemia, unspecified: Secondary | ICD-10-CM

## 2016-10-29 DIAGNOSIS — Z125 Encounter for screening for malignant neoplasm of prostate: Secondary | ICD-10-CM

## 2016-10-29 DIAGNOSIS — E559 Vitamin D deficiency, unspecified: Secondary | ICD-10-CM

## 2016-10-29 DIAGNOSIS — R7303 Prediabetes: Secondary | ICD-10-CM

## 2016-10-30 ENCOUNTER — Ambulatory Visit (INDEPENDENT_AMBULATORY_CARE_PROVIDER_SITE_OTHER): Payer: Medicare Other

## 2016-10-30 VITALS — BP 108/82 | HR 69 | Temp 98.4°F | Ht 71.0 in | Wt 205.0 lb

## 2016-10-30 DIAGNOSIS — E785 Hyperlipidemia, unspecified: Secondary | ICD-10-CM

## 2016-10-30 DIAGNOSIS — Z0000 Encounter for general adult medical examination without abnormal findings: Secondary | ICD-10-CM

## 2016-10-30 DIAGNOSIS — R7303 Prediabetes: Secondary | ICD-10-CM

## 2016-10-30 DIAGNOSIS — E559 Vitamin D deficiency, unspecified: Secondary | ICD-10-CM

## 2016-10-30 DIAGNOSIS — Z125 Encounter for screening for malignant neoplasm of prostate: Secondary | ICD-10-CM | POA: Diagnosis not present

## 2016-10-30 DIAGNOSIS — Z23 Encounter for immunization: Secondary | ICD-10-CM

## 2016-10-30 LAB — COMPREHENSIVE METABOLIC PANEL
ALT: 26 U/L (ref 0–53)
AST: 20 U/L (ref 0–37)
Albumin: 4.4 g/dL (ref 3.5–5.2)
Alkaline Phosphatase: 75 U/L (ref 39–117)
BILIRUBIN TOTAL: 0.7 mg/dL (ref 0.2–1.2)
BUN: 19 mg/dL (ref 6–23)
CALCIUM: 9.8 mg/dL (ref 8.4–10.5)
CO2: 31 meq/L (ref 19–32)
CREATININE: 0.72 mg/dL (ref 0.40–1.50)
Chloride: 105 mEq/L (ref 96–112)
GFR: 115.85 mL/min (ref >60.00)
Glucose, Bld: 100 mg/dL — ABNORMAL HIGH (ref 70–99)
Potassium: 4.4 mEq/L (ref 3.5–5.1)
SODIUM: 143 meq/L (ref 135–145)
Total Protein: 7.3 g/dL (ref 6.0–8.3)

## 2016-10-30 LAB — PSA, MEDICARE: PSA: 1.46 ng/mL (ref 0.10–4.00)

## 2016-10-30 LAB — LIPID PANEL
CHOL/HDL RATIO: 2
CHOLESTEROL: 120 mg/dL (ref 0–200)
HDL: 52.6 mg/dL (ref >39.00)
LDL Cholesterol: 51 mg/dL (ref 0–99)
NonHDL: 67.2
TRIGLYCERIDES: 80 mg/dL (ref 0.0–149.0)
VLDL: 16 mg/dL (ref 0.0–40.0)

## 2016-10-30 LAB — HEMOGLOBIN A1C: HEMOGLOBIN A1C: 5.9 % (ref 4.6–6.5)

## 2016-10-30 LAB — VITAMIN D 25 HYDROXY (VIT D DEFICIENCY, FRACTURES): VITD: 25.45 ng/mL — ABNORMAL LOW (ref 30.00–100.00)

## 2016-10-30 NOTE — Progress Notes (Signed)
PCP notes:   Health maintenance:  PPSV23 - administered  Abnormal screenings:   Fall risk - hx of multiple falls without injury or medical treatment  Patient concerns:   Cerumen in ears - PCP please assess ears during CPE   Nurse concerns:  None  Next PCP appt:   11/03/16 @ 1300

## 2016-10-30 NOTE — Patient Instructions (Signed)
Mr. Whitenight , Thank you for taking time to come for your Medicare Wellness Visit. I appreciate your ongoing commitment to your health goals. Please review the following plan we discussed and let me know if I can assist you in the future.   These are the goals we discussed: Goals    . Increase physical activity          Starting 10/30/2016, I will continue to exercise and do yard work for at least 2 hours daily.        This is a list of the screening recommended for you and due dates:  Health Maintenance  Topic Date Due  . DTaP/Tdap/Td vaccine (1 - Tdap) 05/03/2022*  . Flu Shot  12/30/2016  . Colon Cancer Screening  10/20/2018  . Tetanus Vaccine  05/03/2022  .  Hepatitis C: One time screening is recommended by Center for Disease Control  (CDC) for  adults born from [birth year] through 1966.   Completed  . Pneumonia vaccines  Completed  *Topic was postponed. The date shown is not the original due date.   Preventive Care for Adults  A healthy lifestyle and preventive care can promote health and wellness. Preventive health guidelines for adults include the following key practices.  . A routine yearly physical is a good way to check with your health care provider about your health and preventive screening. It is a chance to share any concerns and updates on your health and to receive a thorough exam.  . Visit your dentist for a routine exam and preventive care every 6 months. Brush your teeth twice a day and floss once a day. Good oral hygiene prevents tooth decay and gum disease.  . The frequency of eye exams is based on your age, health, family medical history, use  of contact lenses, and other factors. Follow your health care provider's ecommendations for frequency of eye exams.  . Eat a healthy diet. Foods like vegetables, fruits, whole grains, low-fat dairy products, and lean protein foods contain the nutrients you need without too many calories. Decrease your intake of foods high in solid  fats, added sugars, and salt. Eat the right amount of calories for you. Get information about a proper diet from your health care provider, if necessary.  . Regular physical exercise is one of the most important things you can do for your health. Most adults should get at least 150 minutes of moderate-intensity exercise (any activity that increases your heart rate and causes you to sweat) each week. In addition, most adults need muscle-strengthening exercises on 2 or more days a week.  Silver Sneakers may be a benefit available to you. To determine eligibility, you may visit the website: www.silversneakers.com or contact program at [phone] Mon-Fri between 8AM-8PM.   . Maintain a healthy weight. The body mass index (BMI) is a screening tool to identify possible weight problems. It provides an estimate of body fat based on height and weight. Your health care provider can find your BMI and can help you achieve or maintain a healthy weight.   For adults 20 years and older: ? A BMI below 18.5 is considered underweight. ? A BMI of 18.5 to 24.9 is normal. ? A BMI of 25 to 29.9 is considered overweight. ? A BMI of 30 and above is considered obese.   . Maintain normal blood lipids and cholesterol levels by exercising and minimizing your intake of saturated fat. Eat a balanced diet with plenty of fruit and vegetables. Blood tests  for lipids and cholesterol should begin at age 27 and be repeated every 5 years. If your lipid or cholesterol levels are high, you are over 50, or you are at high risk for heart disease, you may need your cholesterol levels checked more frequently. Ongoing high lipid and cholesterol levels should be treated with medicines if diet and exercise are not working.  . If you smoke, find out from your health care provider how to quit. If you do not use tobacco, please do not start.  . If you choose to drink alcohol, please do not consume more than 2 drinks per day. One drink is  considered to be 12 ounces (355 mL) of beer, 5 ounces (148 mL) of wine, or 1.5 ounces (44 mL) of liquor.  . If you are 28-39 years old, ask your health care provider if you should take aspirin to prevent strokes.  . Use sunscreen. Apply sunscreen liberally and repeatedly throughout the day. You should seek shade when your shadow is shorter than you. Protect yourself by wearing long sleeves, pants, a wide-brimmed hat, and sunglasses year round, whenever you are outdoors.  . Once a month, do a whole body skin exam, using a mirror to look at the skin on your back. Tell your health care provider of new moles, moles that have irregular borders, moles that are larger than a pencil eraser, or moles that have changed in shape or color.

## 2016-10-30 NOTE — Progress Notes (Signed)
Pre visit review using our clinic review tool, if applicable. No additional management support is needed unless otherwise documented below in the visit note. 

## 2016-10-30 NOTE — Progress Notes (Signed)
Subjective:   Alexander [name] is a 66 y.o. male who presents for Medicare Annual/Subsequent preventive examination.  Review of Systems:  N/A Cardiac Risk Factors include: advanced age (>16men, >18 women);male gender;hypertension;dyslipidemia     Objective:    Vitals: BP 108/82 (BP Location: Right Arm, Patient Position: Sitting, Cuff Size: Normal)   Pulse 69   Temp 98.4 F (36.9 C) (Oral)   Ht 5\' 11"  (1.803 m)   Wt 205 lb (93 kg)   SpO2 96%   BMI 28.59 kg/m   Body mass index is 28.59 kg/m.  Tobacco History  Smoking Status  . Never Smoker  Smokeless Tobacco  . Never Used     Counseling given: No   Past Medical History:  Diagnosis Date  . Acute cholecystitis 03/2016   with sepsis  . Arthritis   . CAD S/P percutaneous coronary angioplasty 1995, 2000, 2005   a. 1995 s/p BMS;  b. 2000 s/p BMS;  c. 2005 s/p stent - All stents in [facility] (RCA, LAD & OM - unknown on which date);  d. 04/2015 NSTEMI/Cath: LM nl, ost/p LAD 20%, patent mLAD stent, RI small, OM2 patent stent, pRCA 20%, patent stent, EF 45-50%-->Med Rx.  . Chronic pain syndrome    a. Followed @ [facility];  b. Uses 12-14 excedrins per day.  . Essential hypertension   . History of depression   . History of post poliomyelitis muscular atrophy   . HLD (hyperlipidemia)   . Ischemic cardiomyopathy    a. 04/2015 Echo: EF 40-45%, mod antsept, ant, antlat, apical HK, mild AI/MR, PASP 62mmHg.  . Migraines   . Neuropathy   . 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 NSTEMI (non-ST elevated myocardial infarction) ([place]) 04/2015   - Demand Ischemia in setting of Sepsis (also with prior MI history  . OSA (obstructive sleep apnea)    does not want to use CPAP, using mouth guard  . PAF (paroxysmal atrial fibrillation) ([place])    a. 04/2015 in setting of cholecystits and sepsis -->Amio;  b. CHA2DS2VASc = 4--> Eliquis.  . Pneumonia 04/2015  . Post-polio syndrome    a. ambulates with braces.  . Shoulder pain, right     Past Surgical History:  Procedure Laterality Date  . CARDIAC CATHETERIZATION N/A 04/30/2015   Procedure: Left Heart Cath and Coronary Angiography;  Surgeon: Troy Sine, MD;  Location: [facility];  Service: Cardiovascular; LM nl, ost/p LAD 20%, patent mLAD stent, RI small, OM2 patent stent, pRCA 20%, patent stent, EF 45-50%-->Med Rx   . cervical spine stimulator  08/2012  . CHOLECYSTECTOMY  06/10/2015   Procedure: LAPAROSCOPIC CHOLECYSTECTOMY;  Surgeon: Mickeal Skinner, MD;  Location: [facility];  Service: General;;  . COLONOSCOPY  09/2013   TA x2, rpt 5 yrs (Pyrtle)  . CORONARY ANGIOPLASTY WITH STENT PLACEMENT  1995, 2000, 2005   stents in RCA, LAD & Cx-OM.  Marland Kitchen lumbar spine stimulator    . TRANSTHORACIC ECHOCARDIOGRAM  04/2015   EF 40-45%, mod antsept, ant, antlat, apical HK, mild AI/MR, PASP 65mmHg.  Marland Kitchen UVULOPALATOPHARYNGOPLASTY, TONSILLECTOMY AND SEPTOPLASTY  2002   Family History  Problem Relation Age of Onset  . Diabetes Mother   . Cancer Sister 91       male cancer  . Cancer Brother 60       breast  . Coronary artery disease Neg Hx   . Stroke Neg Hx   . Colon cancer Neg Hx   .  Pancreatic cancer Neg Hx   . Stomach cancer Neg Hx    History  Sexual Activity  . Sexual activity: Yes    Outpatient Encounter Prescriptions as of 10/30/2016  Medication Sig  . aspirin 81 MG chewable tablet Chew 1 tablet (81 mg total) by mouth daily.  Marland Kitchen atorvastatin (LIPITOR) 80 MG tablet Take 1 tablet (80 mg total) by mouth daily at 6 PM.  . Cholecalciferol ([employer] VITAMIN D) 2000 UNITS CAPS Take 1 capsule (2,000 Units total) by mouth daily.  Marland Kitchen gabapentin (NEURONTIN) 600 MG tablet TAKE 2 TABLETS 3 TIMES A DAY  . lisinopril (PRINIVIL,ZESTRIL) 2.5 MG tablet TAKE ONE TABLET BY MOUTH  DAILY  . metoprolol tartrate (LOPRESSOR) 25 MG tablet Take 0.5 tablets (12.5 mg total) by mouth 2 (two) times daily.  . Multiple Vitamins-Minerals (MULTIVITAMIN ADULTS 50+ PO) Take 1 tablet by mouth daily.  .  Omega-3 Fatty Acids (FISH OIL) 1000 MG CAPS Take 1 capsule by mouth daily.  Marland Kitchen VIAGRA 100 MG tablet TAKE 1/2 TO 1 TABLETS BY MOUTH DAILY AS NEEDED FOR ERECTILE DYSFUNCTION.  . vitamin C (ASCORBIC ACID) 500 MG tablet Take 1,000 mg by mouth daily.  . vitamin E 400 UNIT capsule Take 400 Units by mouth daily.  . [DISCONTINUED] methocarbamol (ROBAXIN) 500 MG tablet TAKE 1 TABLET THREE TIMES A DAY AS NEEDED FOR MUSCLE SPASM  . [DISCONTINUED] rivaroxaban (XARELTO) 20 MG TABS tablet Take 1 tablet (20 mg total) by mouth daily with supper.  . [DISCONTINUED] sildenafil (REVATIO) 20 MG tablet Take 3-5 tablets QD PRN relations  . [DISCONTINUED] sildenafil (VIAGRA) 100 MG tablet Take 0.5-1 tablets (50-100 mg total) by mouth daily as needed for erectile dysfunction.   No facility-administered encounter medications on file as of 10/30/2016.     Activities of Daily Living In your present state of health, do you have any difficulty performing the following activities: 10/30/2016  Hearing? N  Vision? N  Difficulty concentrating or making decisions? Y  Walking or climbing stairs? Y  Dressing or bathing? N  Doing errands, shopping? N  Preparing Food and eating ? N  Using the Toilet? N  In the past six months, have you accidently leaked urine? N  Do you have problems with loss of bowel control? N  Managing your Medications? N  Managing your Finances? N  Some recent data might be hidden    Patient Care Team: [name], MD as PCP - General (Family Medicine) Leonie Man, MD as Consulting Physician (Cardiology) Wilhelmina Mcardle, MD as Consulting Physician (Pulmonary Disease)   Assessment:     Hearing Screening   125Hz  250Hz  500Hz  1000Hz  2000Hz  3000Hz  4000Hz  6000Hz  8000Hz   Right ear:   40 40 40  40    Left ear:   40 40 40  40    Vision Screening Comments: Last vision exam in 2017    Exercise Activities and Dietary recommendations Current Exercise Habits: Home exercise routine, Type of  exercise: strength training/weights;Other - see comments (stationary bike), Time (Minutes): 60, Frequency (Times/Week): 3, Weekly Exercise (Minutes/Week): 180, Intensity: Moderate  Goals    . Increase physical activity          Starting 10/30/2016, I will continue to exercise and do yard work for at least 2 hours daily.       Fall Risk Fall Risk  10/30/2016 10/29/2015 07/12/2014 06/29/2013  Falls in the past year? Yes Yes No Yes  Number falls in past yr: 2 or more 2 or more -  2 or more  Injury with Fall? No No - No  Risk Factor Category  High Fall Risk High Fall Risk - High Fall Risk  Risk for fall due to : History of fall(s);Impaired balance/gait;Impaired mobility - - Impaired balance/gait  Risk for fall due to (comments): - - - Post-polio syndrome; uses bilateral leg braces  Follow up - Falls evaluation completed - -   Depression Screen PHQ 2/9 Scores 10/30/2016 10/29/2015 07/12/2014 06/29/2013  PHQ - 2 Score 0 0 0 0    Cognitive Function MMSE - Mini Mental State Exam 10/30/2016 10/29/2015  Orientation to time 5 5  Orientation to Place 5 5  Registration 3 3  Attention/ Calculation 0 0  Recall 3 3  Language- name 2 objects 0 0  Language- repeat 1 1  Language- follow 3 step command 3 3  Language- read & follow direction 0 0  Write a sentence 0 0  Copy design 0 0  Total score 20 20     PLEASE NOTE: A Mini-Cog screen was completed. Maximum score is 20. A value of 0 denotes this part of Folstein MMSE was not completed or the patient failed this part of the Mini-Cog screening.   Mini-Cog Screening Orientation to Time - Max 5 pts Orientation to Place - Max 5 pts Registration - Max 3 pts Recall - Max 3 pts Language Repeat - Max 1 pts Language Follow 3 Step Command - Max 3 pts     Immunization History  Administered Date(s) Administered  . Influenza, High Dose Seasonal PF 03/04/2016  . Influenza, Seasonal, Injecte, Preservative Fre 05/03/2012, 05/05/2014, 02/28/2015  .  Influenza,inj,Quad PF,36+ Mos 02/27/2013  . Pneumococcal Conjugate-13 04/28/2015  . Pneumococcal Polysaccharide-23 10/30/2016  . Td 05/03/2012  . Zoster 06/29/2013   Screening Tests Health Maintenance  Topic Date Due  . DTaP/Tdap/Td (1 - Tdap) 05/03/2022 (Originally 05/04/2012)  . INFLUENZA VACCINE  12/30/2016  . COLONOSCOPY  10/20/2018  . TETANUS/TDAP  05/03/2022  . Hepatitis C Screening  Completed  . PNA vac Low Risk Adult  Completed      Plan:     I have personally reviewed and addressed the Medicare Annual Wellness questionnaire and have noted the following in the patient's chart:  A. Medical and social history B. Use of alcohol, tobacco or illicit drugs  C. Current medications and supplements D. Functional ability and status E.  Nutritional status F.  Physical activity G. Advance directives H. List of other physicians I.  Hospitalizations, surgeries, and ER visits in previous 12 months J.  [place] to include hearing, vision, cognitive, depression L. Referrals and appointments - none  In addition, I have reviewed and discussed with patient certain preventive protocols, quality metrics, and best practice recommendations. A written personalized care plan for preventive services as well as general preventive health recommendations were provided to patient.  See attached scanned questionnaire for additional information.   Signed,   Lindell Noe, MHA, BS, LPN Health Coach

## 2016-10-31 NOTE — Progress Notes (Signed)
I reviewed health advisor's note, was available for consultation, and agree with documentation and plan.  

## 2016-11-03 ENCOUNTER — Encounter: Payer: Self-pay | Admitting: Family Medicine

## 2016-11-03 ENCOUNTER — Ambulatory Visit (INDEPENDENT_AMBULATORY_CARE_PROVIDER_SITE_OTHER): Payer: Medicare Other | Admitting: Family Medicine

## 2016-11-03 VITALS — BP 110/64 | HR 64 | Temp 97.8°F | Ht 71.0 in | Wt 205.0 lb

## 2016-11-03 DIAGNOSIS — R7303 Prediabetes: Secondary | ICD-10-CM

## 2016-11-03 DIAGNOSIS — Z9861 Coronary angioplasty status: Secondary | ICD-10-CM

## 2016-11-03 DIAGNOSIS — I2510 Atherosclerotic heart disease of native coronary artery without angina pectoris: Secondary | ICD-10-CM

## 2016-11-03 DIAGNOSIS — I10 Essential (primary) hypertension: Secondary | ICD-10-CM

## 2016-11-03 DIAGNOSIS — G14 Postpolio syndrome: Secondary | ICD-10-CM | POA: Diagnosis not present

## 2016-11-03 DIAGNOSIS — G894 Chronic pain syndrome: Secondary | ICD-10-CM | POA: Diagnosis not present

## 2016-11-03 DIAGNOSIS — E559 Vitamin D deficiency, unspecified: Secondary | ICD-10-CM

## 2016-11-03 DIAGNOSIS — E785 Hyperlipidemia, unspecified: Secondary | ICD-10-CM | POA: Diagnosis not present

## 2016-11-03 DIAGNOSIS — I255 Ischemic cardiomyopathy: Secondary | ICD-10-CM

## 2016-11-03 DIAGNOSIS — Z7189 Other specified counseling: Secondary | ICD-10-CM | POA: Diagnosis not present

## 2016-11-03 DIAGNOSIS — I480 Paroxysmal atrial fibrillation: Secondary | ICD-10-CM

## 2016-11-03 NOTE — Assessment & Plan Note (Signed)
Chronic, stable. Continue current regimen. 

## 2016-11-03 NOTE — Assessment & Plan Note (Signed)
Continue aspirin, statin.  

## 2016-11-03 NOTE — Assessment & Plan Note (Signed)
Chronic, stable. Continue high dose atorvastatin.

## 2016-11-03 NOTE — Assessment & Plan Note (Addendum)
Off xarelto. No recent afib. Unclear if he should continue AC. He does regularly take 6 excedrin a day for body aches.

## 2016-11-03 NOTE — Assessment & Plan Note (Signed)
Unsure if regular with vit D - encouraged take 2000 IU daily.

## 2016-11-03 NOTE — Assessment & Plan Note (Signed)
Takes 6 excedrin a day for chronic body aches.

## 2016-11-03 NOTE — Patient Instructions (Addendum)
Bring me copy of your living will.  Let me know if you're interested in physical therapy fall prevention.  You are doing well today. Return as needed or in 1 year for next medicare wellness visit with Katha Cabal and follow up with me.  Increase vitamin D to 2000 units daily.

## 2016-11-03 NOTE — Assessment & Plan Note (Signed)
Advised he continue low dose metoprolol and lisinopril.

## 2016-11-03 NOTE — Assessment & Plan Note (Signed)
rec avoid added sugars in diet.

## 2016-11-03 NOTE — Assessment & Plan Note (Addendum)
Chronic, stable.  High fall risk. Declines fall prevention PT at this time.

## 2016-11-03 NOTE — Progress Notes (Signed)
BP 110/64   Pulse 64   Temp 97.8 F (36.6 C) (Oral)   Ht 5\' 11"  (1.803 m)   Wt 205 lb (93 kg) Comment: this morning per patient  BMI 28.59 kg/m    CC: AMW f/u visit Subjective:    Patient ID: Alexander [name], male    DOB: February 11, 1951, 66 y.o.   MRN: [id number]  HPI: Alexander [name] is a 66 y.o. male presenting on 11/03/2016 for Annual Exam (Pt denies complaints at this time)   [name] last week for medicare wellness visit. Note reviewed.  Requested cerumen cleaning. Multiple falls without injury (ongoing fall risk due to post-polio syndrome). Mainly in evenings when he's not using leg braces. Discussed fall prevention measures. He has done fall training.   HTN - Compliant with current antihypertensive regimen of lisinopril 2.5mg  daily and metoprolol 12.5mg  bid. Does not check blood pressures at home. No low blood pressure readings or symptoms of dizziness/syncope. Denies HA, vision changes, CP/tightness, SOB, leg swelling.   Continues high excedrin use for body aches 2 TID.   Preventative: COLONOSCOPY Date: 09/2013 TA x2, rpt 5 yrs (Pyrtle) Prostate cancer screening - desires continued screening Flu shot yearly Td 2013 Prevnar 2016, pnuemovax 2018.  zostavax - 06/2013 shingrix - discussed.  Advanced directives: to set up at home. Wife would be HCPOA. Does not want prolonged life support. Wants to be cremated. Seat belt use discussed Sunscreen use discussed. No changing moles on skin.  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 Interim medical history since our last visit reviewed. Allergies and medications reviewed and  updated. Outpatient Medications Prior to Visit  Medication Sig Dispense Refill  . aspirin 81 MG chewable tablet Chew 1 tablet (81 mg total) by mouth daily. 30 tablet 0  . atorvastatin (LIPITOR) 80 MG tablet Take 1 tablet (80 mg total) by mouth daily at 6 PM. 90 tablet 3  . Cholecalciferol ([employer]S VITAMIN D) 2000 UNITS CAPS Take 1 capsule (2,000 Units total) by mouth daily.    Marland Kitchen gabapentin (NEURONTIN) 600 MG tablet TAKE 2 TABLETS 3 TIMES A DAY 540 tablet 3  . lisinopril (PRINIVIL,ZESTRIL) 2.5 MG tablet TAKE ONE TABLET BY MOUTH  DAILY 90 tablet 3  . metoprolol tartrate (LOPRESSOR) 25 MG tablet Take 0.5 tablets (12.5 mg total) by mouth 2 (two) times daily. 90 tablet 3  . Multiple Vitamins-Minerals (MULTIVITAMIN ADULTS 50+ PO) Take 1 tablet by mouth daily.    . Omega-3 Fatty Acids (FISH OIL) 1000 MG CAPS Take 1 capsule by mouth daily.    Marland Kitchen VIAGRA 100 MG tablet TAKE 1/2 TO 1 TABLETS BY MOUTH DAILY AS NEEDED FOR ERECTILE DYSFUNCTION. 18 tablet 3  . vitamin C (ASCORBIC ACID) 500 MG tablet Take 1,000 mg by mouth daily.    . vitamin E 400 UNIT capsule Take 400 Units by mouth daily.     No facility-administered medications prior to visit.      Per HPI unless specifically indicated in ROS section below Review of Systems     Objective:    BP 110/64   Pulse 64   Temp 97.8 F (36.6 C) (Oral)   Ht 5\' 11"  (1.803 m)  Wt 205 lb (93 kg) Comment: this morning per patient  BMI 28.59 kg/m   Wt Readings from Last 3 Encounters:  11/03/16 205 lb (93 kg)  10/30/16 205 lb (93 kg)  03/06/16 196 lb (88.9 kg)    Physical Exam  Constitutional: He is oriented to person, place, and time. He appears well-developed and well-nourished. No distress.  HENT:  Head: Normocephalic and atraumatic.  Right Ear: Hearing, tympanic membrane, external ear and ear canal normal.  Left Ear: Hearing, tympanic membrane, external ear and ear canal normal.  Nose: Nose normal.  Mouth/Throat: Uvula is midline, oropharynx is clear  and moist and mucous membranes are normal. No oropharyngeal exudate, posterior oropharyngeal edema or posterior oropharyngeal erythema.  Eyes: Conjunctivae and EOM are normal. Pupils are equal, round, and reactive to light. No scleral icterus.  Neck: Normal range of motion. Neck supple. No thyromegaly present.  Cardiovascular: Normal rate, regular rhythm, normal heart sounds and intact distal pulses.   No murmur heard. Pulses:      Radial pulses are 2+ on the right side, and 2+ on the left side.  Pulmonary/Chest: Effort normal and breath sounds normal. No respiratory distress. He has no wheezes. He has no rales.  Abdominal: Soft. Bowel sounds are normal. He exhibits no distension and no mass. There is no tenderness. There is no rebound and no guarding.  Genitourinary: Rectum normal and prostate normal. Rectal exam shows no external hemorrhoid, no internal hemorrhoid, no fissure, no mass, no tenderness and anal tone normal. Prostate is not enlarged (20gm) and not tender.  Musculoskeletal: Normal range of motion. He exhibits no edema.  Cervical spine stimulator present L lower back, c/d/i  Bilateral LE braces present  Lymphadenopathy:    He has no cervical adenopathy.  Neurological: He is alert and oriented to person, place, and time.  CN grossly intact, station and gait intact  Skin: Skin is warm and dry. No rash noted.  Psychiatric: He has a normal mood and affect. His behavior is normal. Judgment and thought content normal.  Nursing note and vitals reviewed.  Results for orders placed or performed in visit on 10/30/16  Lipid panel  Result Value Ref Range   Cholesterol 120 0 - 200 mg/dL   Triglycerides 80.0 0.0 - 149.0 mg/dL   HDL 52.60 >39.00 mg/dL   VLDL 16.0 0.0 - 40.0 mg/dL   LDL Cholesterol 51 0 - 99 mg/dL   Total CHOL/HDL Ratio 2    NonHDL 67.20   Comprehensive metabolic panel  Result Value Ref Range   Sodium 143 135 - 145 mEq/L   Potassium 4.4 3.5 - 5.1 mEq/L   Chloride 105  96 - 112 mEq/L   CO2 31 19 - 32 mEq/L   Glucose, Bld 100 (H) 70 - 99 mg/dL   BUN 19 6 - 23 mg/dL   Creatinine, Ser 0.72 0.40 - 1.50 mg/dL   Total Bilirubin 0.7 0.2 - 1.2 mg/dL   Alkaline Phosphatase 75 39 - 117 U/L   AST 20 0 - 37 U/L   ALT 26 0 - 53 U/L   Total Protein 7.3 6.0 - 8.3 g/dL   Albumin 4.4 3.5 - 5.2 g/dL   Calcium 9.8 8.4 - 10.5 mg/dL   GFR 115.85 >60.00 mL/min  VITAMIN D 25 Hydroxy (Vit-D Deficiency, Fractures)  Result Value Ref Range   VITD 25.45 (L) 30.00 - 100.00 ng/mL  Hemoglobin A1c  Result Value Ref Range   Hgb A1c MFr Bld 5.9 4.6 - 6.5 %  PSA, Medicare  Result Value Ref Range   PSA 1.46 0.10 - 4.00 ng/ml      Assessment & Plan:   Problem List Items Addressed This Visit    Advanced care planning/counseling discussion    Advanced directives: to set up at home. Wife would be HCPOA. Does not want prolonged life support. Wants to be cremated.      CAD S/P percutaneous coronary angioplasty (Chronic)    Continue aspirin, statin.       Chronic pain syndrome    Takes 6 excedrin a day for chronic body aches.       Essential hypertension (Chronic)    Chronic, stable. Continue current regimen.       Hyperlipidemia with target LDL less than 70 (Chronic)    Chronic, stable. Continue high dose atorvastatin.      Ischemic cardiomyopathy (Chronic)    Advised he continue low dose metoprolol and lisinopril.       PAF (paroxysmal atrial fibrillation) ([place]); CHA2DS2VASc = 4--> Eliquis. (Chronic)    Off xarelto. No recent afib. Unclear if he should continue AC. He does regularly take 6 excedrin a day for body aches.       Post-polio syndrome - Primary    Chronic, stable.  High fall risk. Declines fall prevention PT at this time.       Prediabetes    rec avoid added sugars in diet.       Vitamin D deficiency    Unsure if regular with vit D - encouraged take 2000 IU daily.           Follow up plan: Return in about 1 year (around 11/03/2017) for  medicare wellness visit, follow up visit.  [name], MD

## 2016-11-03 NOTE — Assessment & Plan Note (Signed)
Advanced directives: to set up at home. Wife would be HCPOA. Does not want prolonged life support. Wants to be cremated. 

## 2016-12-21 ENCOUNTER — Other Ambulatory Visit: Payer: Self-pay

## 2017-01-19 ENCOUNTER — Encounter: Payer: Self-pay | Admitting: Family Medicine

## 2017-01-20 MED ORDER — OMEPRAZOLE 40 MG PO CPDR: 40.0000 mg | DELAYED_RELEASE_CAPSULE | Freq: Every day | ORAL | 3 refills | Status: DC

## 2017-02-23 ENCOUNTER — Other Ambulatory Visit: Payer: Self-pay | Admitting: Family Medicine

## 2017-03-19 DIAGNOSIS — Z23 Encounter for immunization: Secondary | ICD-10-CM | POA: Diagnosis not present

## 2017-04-03 ENCOUNTER — Other Ambulatory Visit: Payer: Self-pay | Admitting: Cardiology

## 2017-04-08 ENCOUNTER — Encounter: Payer: Self-pay | Admitting: Cardiology

## 2017-04-08 ENCOUNTER — Ambulatory Visit (INDEPENDENT_AMBULATORY_CARE_PROVIDER_SITE_OTHER): Payer: Medicare Other | Admitting: Cardiology

## 2017-04-08 ENCOUNTER — Encounter: Payer: Self-pay | Admitting: *Deleted

## 2017-04-08 VITALS — BP 128/64 | HR 69 | Ht 71.0 in | Wt 223.0 lb

## 2017-04-08 DIAGNOSIS — I2510 Atherosclerotic heart disease of native coronary artery without angina pectoris: Secondary | ICD-10-CM | POA: Diagnosis not present

## 2017-04-08 DIAGNOSIS — I10 Essential (primary) hypertension: Secondary | ICD-10-CM | POA: Diagnosis not present

## 2017-04-08 DIAGNOSIS — Z9861 Coronary angioplasty status: Secondary | ICD-10-CM | POA: Diagnosis not present

## 2017-04-08 DIAGNOSIS — I214 Non-ST elevation (NSTEMI) myocardial infarction: Secondary | ICD-10-CM | POA: Diagnosis not present

## 2017-04-08 DIAGNOSIS — I480 Paroxysmal atrial fibrillation: Secondary | ICD-10-CM

## 2017-04-08 DIAGNOSIS — I255 Ischemic cardiomyopathy: Secondary | ICD-10-CM

## 2017-04-08 DIAGNOSIS — E785 Hyperlipidemia, unspecified: Secondary | ICD-10-CM

## 2017-04-08 MED ORDER — ATORVASTATIN CALCIUM 80 MG PO TABS: 40.0000 mg | ORAL_TABLET | Freq: Every day | ORAL | 3 refills | Status: DC

## 2017-04-08 MED ORDER — METOPROLOL TARTRATE 25 MG PO TABS: 12.5000 mg | ORAL_TABLET | Freq: Two times a day (BID) | ORAL | 3 refills | Status: DC | PRN

## 2017-04-08 NOTE — Assessment & Plan Note (Signed)
Mild ischemic cardiomyopathy with low normal to mildly reduced EF based on most recent echo.  He is not acting like anybody who has a reduced EF.  He is on an ACE inhibitor for afterload reduction, I am not sure how much he would benefit he is getting from low-dose beta-blocker.  In an effort to give him some more energy we will back off and stop the beta-blocker and use it more as a as needed for A. fib recurrence.  He is euvolemic and not requiring any diuretic.

## 2017-04-08 NOTE — Progress Notes (Signed)
PCP: [name], MD  Clinic Note: Chief Complaint  Patient presents with  . Follow-up    See problems below; currently no symptoms   1. CAD S/P percutaneous coronary angioplasty   2. PAF (paroxysmal atrial fibrillation) ([place])   3. Ischemic cardiomyopathy   4. Essential hypertension   5. Hyperlipidemia with target LDL less than 70    HPI: Alexander [name] is a 66 y.o. male with a PMH below who presents today for annual follow-up of CAD-mild ischemic cardiomyopathy with sepsis related PAF (04/2015: Sepsis from Cholecystitis --> Septic --> Afib RVR with + Troponin & reduced LVEF --> DCCV & Amiod, Cath revealed patent stents & some improvement in EF. -->  Had cholecystectomy in January 2017) He has a history of polio and has significant leg atrophy.  He has bilateral leg braces and walks with 2 canes.  Despite this he is very active and usually does circuits around his backyard.  He does have a scooter which he uses for going long distances.  Alexander [name] was last seen back in October 2017 -> he was doing well without any major complaints.  Noted with a little bit of allergy issues but seemed to be recovering from his cholecystectomy and sepsis issues.  He was no longer on amiodarone but was on low-dose beta-blocker.  At that time he is also on Xarelto which is subsequently been stopped.  He is now only on low-dose beta-blocker and aspirin.  Recent Hospitalizations: None  Studies Personally Reviewed - (if available, images/films reviewed: From Epic Chart or Care Everywhere)  No new studies  Interval History: Alexander [name] returns today overall doing very well.  He has not had any further cardiac symptoms to speak of.  He denies any rest or exertional chest tightness or pressure.  He denies any recurrent rapid irregular heartbeats or palpitations.  No PND, orthopnea or edema.  He remains active doing as many activities as he can.  He does note some muscle aching though at the end of the  day.  No weakness or syncope/near syncope. No TIA/amaurosis fugax symptoms. No melena, hematochezia, hematuria, or epstaxis.   ROS: A comprehensive was performed. Review of Systems  Constitutional: Negative for malaise/fatigue.  HENT: Negative for congestion.   Respiratory: Negative for cough, shortness of breath and wheezing.   Cardiovascular: Negative.   Musculoskeletal: Negative for joint pain.  Neurological: Negative for dizziness.  Endo/Heme/Allergies: Negative for environmental allergies.  Psychiatric/Behavioral: Negative.   All other systems reviewed and are negative.  I have reviewed and (if needed) personally updated the patient's problem list, medications, allergies, past medical and surgical history, social and family history.   Past Medical History:  Diagnosis Date  . Acute cholecystitis 03/2016   with sepsis  . Arthritis   . CAD S/P percutaneous coronary angioplasty 1995, 2000, 2005   a. 1995 s/p BMS;  b. 2000 s/p BMS;  c. 2005 s/p stent - All stents in [facility] (RCA, LAD & OM - unknown on which date);  d. 04/2015 NSTEMI/Cath: LM nl, ost/p LAD 20%, patent mLAD stent, RI small, OM2 patent stent, pRCA 20%, patent stent, EF 45-50%-->Med Rx.  . Chronic pain syndrome    a. Followed @ [facility];  b. Uses 12-14 excedrins per day.  . Essential hypertension   . History of depression   . History of post poliomyelitis muscular atrophy   . HLD (hyperlipidemia)   . Ischemic cardiomyopathy    a. 04/2015 Echo: EF 40-45%, mod antsept, ant,  antlat, apical HK, mild AI/MR, PASP 1mmHg.  . Migraines   . Neuropathy   . 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 NSTEMI (non-ST elevated myocardial infarction) ([place]) 04/2015   - Demand Ischemia in setting of Sepsis (also with prior MI history  . OSA (obstructive sleep apnea)    does not want to use CPAP, using mouth guard  . PAF (paroxysmal atrial fibrillation) ([place])    a. 04/2015 in setting of cholecystits and sepsis  -->Amio;  b. CHA2DS2VASc = 4--> Eliquis.  . Pneumonia 04/2015  . Post-polio syndrome    a. ambulates with braces.  . Shoulder pain, right     Past Surgical History:  Procedure Laterality Date  . cervical spine stimulator  08/2012  . COLONOSCOPY  09/2013   TA x2, rpt 5 yrs (Pyrtle)  . CORONARY ANGIOPLASTY WITH STENT PLACEMENT  1995, 2000, 2005   stents in RCA, LAD & Cx-OM. -Stents were patent by cardiac catheterization in November 2016  . lumbar spine stimulator    . TRANSTHORACIC ECHOCARDIOGRAM  04/2015   EF 40-45%, mod antsept, ant, antlat, apical HK, mild AI/MR, PASP 65mmHg.  Marland Kitchen UVULOPALATOPHARYNGOPLASTY, TONSILLECTOMY AND SEPTOPLASTY  2002    Current Meds  Medication Sig  . aspirin 81 MG chewable tablet Chew 1 tablet (81 mg total) by mouth daily.  Marland Kitchen atorvastatin (LIPITOR) 80 MG tablet Take 0.5 tablets (40 mg total) daily at 6 PM by mouth.  . Cholecalciferol ([employer] VITAMIN D) 2000 UNITS CAPS Take 1 capsule (2,000 Units total) by mouth daily.  Marland Kitchen gabapentin (NEURONTIN) 600 MG tablet TAKE 2 TABLETS 3 TIMES A DAY  . lisinopril (PRINIVIL,ZESTRIL) 2.5 MG tablet TAKE ONE TABLET BY MOUTH  DAILY  . metoprolol tartrate (LOPRESSOR) 25 MG tablet Take 0.5 tablets (12.5 mg total) 2 (two) times daily as needed by mouth.  . Multiple Vitamins-Minerals (MULTIVITAMIN ADULTS 50+ PO) Take 1 tablet by mouth daily.  . Omega-3 Fatty Acids (FISH OIL) 1000 MG CAPS Take 1 capsule by mouth daily.  Marland Kitchen omeprazole (PRILOSEC) 40 MG capsule Take 1 capsule (40 mg total) by mouth daily.  Marland Kitchen VIAGRA 100 MG tablet TAKE 1/2 TO 1 TABLETS BY MOUTH DAILY AS NEEDED FOR ERECTILE DYSFUNCTION.  . vitamin C (ASCORBIC ACID) 500 MG tablet Take 1,000 mg by mouth daily.  . vitamin E 400 UNIT capsule Take 400 Units by mouth daily.  . [DISCONTINUED] atorvastatin (LIPITOR) 80 MG tablet TAKE ONE TABLET BY MOUTH DAILY AT 6 PM.  . [DISCONTINUED] metoprolol tartrate (LOPRESSOR) 25 MG tablet Take 0.5 tablets (12.5 mg total) by mouth 2 (two)  times daily.    No Known Allergies  Social History   Socioeconomic History  . Marital status: Married    Spouse name: None  . Number of children: None  . Years of education: None  . Highest education level: None  Social Needs  . Financial resource strain: None  . Food insecurity - worry: None  . Food insecurity - inability: None  . Transportation needs - medical: None  . Transportation needs - non-medical: None  Occupational History  . None  Tobacco Use  . Smoking status: Never Smoker  . Smokeless tobacco: Never Used  Substance and Sexual Activity  . Alcohol use: No    Alcohol/week: 0.0 oz  . Drug use: No  . Sexual activity: Yes  Other Topics Concern  . 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  Wt Readings from Last 3 Encounters:  04/08/17 223 lb (101.2 kg)  11/03/16 205 lb (93 kg)  10/30/16 205 lb (93 kg)    PHYSICAL EXAM BP 128/64   Pulse 69   Ht 5\' 11"  (1.803 m)   Wt 223 lb (101.2 kg)   BMI 31.10 kg/m  Physical Exam  Constitutional: He is oriented to person, place, and time. He appears well-developed and well-nourished. No distress.  Healthy-appearing.  HENT:  Head: Normocephalic and atraumatic.  Mouth/Throat: Oropharynx is clear and moist. No oropharyngeal exudate.  Eyes: EOM are normal. Pupils are equal, round, and reactive to light. No scleral icterus.  Neck: Neck supple. No JVD present.  Cardiovascular: Normal rate, regular rhythm and normal pulses.  No extrasystoles are present. PMI is not displaced. Exam reveals no gallop and no friction rub.   No murmur heard. Pulmonary/Chest: Effort normal and breath sounds normal. No respiratory distress. He has no wheezes. He has no rales.  Abdominal: Soft. Bowel sounds are normal. He exhibits no distension. There is no tenderness. There is no rebound.  Musculoskeletal: He exhibits no edema.  Both legs are in braces and he walks using 2 canes/crutches.  Overdeveloped upper body strength from constant crutch use.  Neurological: He is alert and oriented to person, place, and time.  Skin: Skin is warm and dry. No rash noted. No erythema.  Psychiatric: He has a normal mood and affect. His behavior is normal. Judgment and thought content normal.  Vitals reviewed.    Adult ECG Report  Rate: 69;  Rhythm: normal sinus rhythm and LVH with repolarization changes.  Otherwise normal axis, intervals and durations;   Narrative Interpretation: stable, relatively normal EKG.  Other studies Reviewed: Additional studies/ records that were reviewed today include:  Recent Labs:    Lab Results  Component Value Date   CHOL 120 10/30/2016   HDL 52.60 10/30/2016   LDLCALC 51 10/30/2016   LDLDIRECT 213.1 06/29/2013   TRIG 80.0 10/30/2016   CHOLHDL 2 10/30/2016    ASSESSMENT / PLAN: Problem List Items Addressed This Visit    CAD S/P percutaneous coronary angioplasty - Primary (Chronic)    Stable.  Patent stents as of cath last year.  On aspirin and statin.  Weaning off low-dose beta-blocker (not sure how much benefit he is getting from such a low dose.      Relevant Medications   atorvastatin (LIPITOR) 80 MG tablet   metoprolol tartrate (LOPRESSOR) 25 MG tablet   Other Relevant Orders   EKG 12-Lead   Essential hypertension (Chronic)    Chronic issue, stable well controlled.  I think were probably okay we need off metoprolol.      Relevant Medications   atorvastatin (LIPITOR) 80 MG tablet   metoprolol tartrate (LOPRESSOR) 25 MG tablet   Hyperlipidemia with target LDL less than 70 (Chronic)     Doing very well based on last set of lipids high-dose statin.  I think we can probably see how he would do cutting his dose in half to 40 mg.  He is having some myalgias which probably is  related to postpolio, however cannot be sure.  He was premature at goal at current dose, we will see how he does with reduced dose.      Relevant Medications   atorvastatin (LIPITOR) 80 MG tablet   metoprolol tartrate (LOPRESSOR) 25 MG tablet   Ischemic cardiomyopathy (Chronic)    Mild ischemic cardiomyopathy with low normal to mildly reduced EF based on most recent echo.  He is not acting like anybody who has a reduced EF.  He is on an ACE inhibitor for afterload reduction, I am not sure how much he would benefit he is getting from low-dose beta-blocker.  In an effort to give him some more energy we will back off and stop the beta-blocker and use it more as a as needed for A. fib recurrence.  He is euvolemic and not requiring any diuretic.      Relevant Medications   atorvastatin (LIPITOR) 80 MG tablet   metoprolol tartrate (LOPRESSOR) 25 MG tablet   Other Relevant Orders   EKG 12-Lead   NSTEMI (non-ST elevated myocardial infarction) (H[facility]) (Chronic)    Distant history of MI, most recently had a demand ischemia infarct back in 2017 in the setting of sepsis.  Slightly reduced EF on echo when he was sick.  I suspect the it was getting better and is probably more stable in the 45-55 range based on the LV gram findings shortly after his acute illness.      Relevant Medications   atorvastatin (LIPITOR) 80 MG tablet   metoprolol tartrate (LOPRESSOR) 25 MG tablet   Other Relevant Orders   EKG 12-Lead   PAF (paroxysmal atrial fibrillation) ([place]); CHA2DS2VASc = 4--> Eliquis. (Chronic)    No longer having any recurrent episodes of A. fib.  He does have an elevated CHA2DS2VASc score of 4.  However that was a triggered episode while he was sick with sepsis from cholecystitis.  He has not had any recurrence in over 2  years.  For now we will simply maintain as needed beta-blocker.  He has recurrence of A. fib he will need to restart beta-blocker and reinitiate anticoagulation.   Continue taking daily aspirin.      Relevant Medications   atorvastatin (LIPITOR) 80 MG tablet   metoprolol tartrate (LOPRESSOR) 25 MG tablet   Other Relevant Orders   EKG 12-Lead      Current medicines are reviewed at length with the patient today. (+/- concerns) can he come off some meds The following changes have been made: see below  Patient Instructions  MEDICATION WEAN  OFF METOPROLOL   - FOR ONE WEEK TAKE ONCE DAILY THEN STOP TAKING METOPROLOL  MAY TAKE IF YOU AN EPISODE RESTART METOPROLOL AT FULL DOSE AS NEEDED  -- DECREASE ATORVASTATIN TO 40 MG DAILY    Your physician wants you to follow-up in [place].You will receive a reminder letter in the mail two months in advance. If you don't receive a letter, please call our office to schedule the follow-up appointment.   If you need a refill on your cardiac medications before your next appointment, please call your pharmacy.    Studies Ordered:   Orders Placed This Encounter  Procedures  . EKG 12-Lead      Glenetta Hew, M.D., M.S. Interventional Cardiologist   Pager # [phone] Phone # [phone] [address] [place], [place] [address]

## 2017-04-08 NOTE — Assessment & Plan Note (Addendum)
No longer having any recurrent episodes of A. fib.  He does have an elevated CHA2DS2VASc score of 4.  However that was a triggered episode while he was sick with sepsis from cholecystitis.  He has not had any recurrence in over 2 years.  For now we will simply maintain as needed beta-blocker.  He has recurrence of A. fib he will need to restart beta-blocker and reinitiate anticoagulation.   Continue taking daily aspirin.

## 2017-04-08 NOTE — Assessment & Plan Note (Signed)
Stable.  Patent stents as of cath last year.  On aspirin and statin.  Weaning off low-dose beta-blocker (not sure how much benefit he is getting from such a low dose.

## 2017-04-08 NOTE — Assessment & Plan Note (Signed)
Doing very well based on last set of lipids high-dose statin.  I think we can probably see how he would do cutting his dose in half to 40 mg.  He is having some myalgias which probably is related to postpolio, however cannot be sure.  He was premature at goal at current dose, we will see how he does with reduced dose.

## 2017-04-08 NOTE — Patient Instructions (Addendum)
MEDICATION WEAN  OFF METOPROLOL   - FOR ONE WEEK TAKE ONCE DAILY THEN STOP TAKING METOPROLOL  MAY TAKE IF YOU AN EPISODE RESTART METOPROLOL AT FULL DOSE AS NEEDED  -- DECREASE ATORVASTATIN TO 40 MG DAILY    Your physician wants you to follow-up in [place].You will receive a reminder letter in the mail two months in advance. If you don't receive a letter, please call our office to schedule the follow-up appointment.   If you need a refill on your cardiac medications before your next appointment, please call your pharmacy.

## 2017-04-08 NOTE — Assessment & Plan Note (Signed)
Chronic issue, stable well controlled.  I think were probably okay we need off metoprolol.

## 2017-04-08 NOTE — Assessment & Plan Note (Signed)
Distant history of MI, most recently had a demand ischemia infarct back in 2017 in the setting of sepsis.  Slightly reduced EF on echo when he was sick.  I suspect the it was getting better and is probably more stable in the 45-55 range based on the LV gram findings shortly after his acute illness.

## 2017-05-10 ENCOUNTER — Other Ambulatory Visit: Payer: Self-pay | Admitting: Family Medicine

## 2017-06-11 ENCOUNTER — Encounter: Payer: Self-pay | Admitting: Family Medicine

## 2017-06-11 ENCOUNTER — Ambulatory Visit (INDEPENDENT_AMBULATORY_CARE_PROVIDER_SITE_OTHER): Payer: Medicare Other | Admitting: Family Medicine

## 2017-06-11 VITALS — BP 134/82 | HR 82 | Temp 98.2°F | Wt 227.5 lb

## 2017-06-11 DIAGNOSIS — R059 Cough, unspecified: Secondary | ICD-10-CM

## 2017-06-11 DIAGNOSIS — J22 Unspecified acute lower respiratory infection: Secondary | ICD-10-CM | POA: Diagnosis not present

## 2017-06-11 DIAGNOSIS — R05 Cough: Secondary | ICD-10-CM

## 2017-06-11 MED ORDER — GUAIFENESIN-CODEINE 100-10 MG/5ML PO SYRP: 5.0000 mL | ORAL_SOLUTION | Freq: Three times a day (TID) | ORAL | 0 refills | Status: DC | PRN

## 2017-06-11 MED ORDER — DOXYCYCLINE HYCLATE 100 MG PO CAPS
100.0000 mg | ORAL_CAPSULE | Freq: Two times a day (BID) | ORAL | 0 refills | Status: DC
Start: 2017-06-11 — End: 2017-11-04

## 2017-06-11 NOTE — Progress Notes (Signed)
Subjective:    Patient ID: Alexander [name], male    DOB: 05-22-1951, 67 y.o.   MRN: [id number]  HPI This is a 67 yo male, accompanied by his wife, who presents today with chest congestion, nasal congestion and cough x 1 week, started as hacking cough, head cold. Increased sputum and SOB, sputum is clear- white, some bloody nasal drainag x1 this morning. According to wife, sounds like he is gasping for air when he lies down. Hearing wheezing, chest feels tight.  Some headache, some ringing in right ear, some sore throat. Sick exposure.  Feels a little better today.  No history of asthma.  Does not frequently get URI.  No fever.  Taking DayQuil and NyQuil with some relief.  Fluid intake poor, he has to void frequently and does not like to increase  trips to the bathroom.  Past Medical History:  Diagnosis Date  . Acute cholecystitis 03/2016   with sepsis  . Arthritis   . CAD S/P percutaneous coronary angioplasty 1995, 2000, 2005   a. 1995 s/p BMS;  b. 2000 s/p BMS;  c. 2005 s/p stent - All stents in [facility] (RCA, LAD & OM - unknown on which date);  d. 04/2015 NSTEMI/Cath: LM nl, ost/p LAD 20%, patent mLAD stent, RI small, OM2 patent stent, pRCA 20%, patent stent, EF 45-50%-->Med Rx.  . Chronic pain syndrome    a. Followed @ [facility];  b. Uses 12-14 excedrins per day.  . Essential hypertension   . History of depression   . History of post poliomyelitis muscular atrophy   . HLD (hyperlipidemia)   . Ischemic cardiomyopathy    a. 04/2015 Echo: EF 40-45%, mod antsept, ant, antlat, apical HK, mild AI/MR, PASP 3mmHg.  . Migraines   . Neuropathy   . 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 NSTEMI (non-ST elevated myocardial infarction) ([place]) 04/2015   - Demand Ischemia in setting of Sepsis (also with prior MI history  . OSA (obstructive sleep apnea)    does not want to use CPAP, using mouth guard  . PAF (paroxysmal atrial fibrillation) ([place])    a. 04/2015 in setting of cholecystits  and sepsis -->Amio;  b. CHA2DS2VASc = 4--> Eliquis.  . Pneumonia 04/2015  . Post-polio syndrome    a. ambulates with braces.  . Shoulder pain, right    Past Surgical History:  Procedure Laterality Date  . CARDIAC CATHETERIZATION N/A 04/30/2015   Procedure: Left Heart Cath and Coronary Angiography;  Surgeon: Troy Sine, MD;  Location: [facility];  Service: Cardiovascular; LM nl, ost/p LAD 20%, patent mLAD stent, RI small, OM2 patent stent, pRCA 20%, patent stent, EF 45-50%-->Med Rx   . cervical spine stimulator  08/2012  . CHOLECYSTECTOMY  06/10/2015   Procedure: LAPAROSCOPIC CHOLECYSTECTOMY;  Surgeon: Mickeal Skinner, MD;  Location: [facility];  Service: General;;  . COLONOSCOPY  09/2013   TA x2, rpt 5 yrs (Pyrtle)  . CORONARY ANGIOPLASTY WITH STENT PLACEMENT  1995, 2000, 2005   stents in RCA, LAD & Cx-OM. -Stents were patent by cardiac catheterization in November 2016  . lumbar spine stimulator    . TRANSTHORACIC ECHOCARDIOGRAM  04/2015   EF 40-45%, mod antsept, ant, antlat, apical HK, mild AI/MR, PASP 53mmHg.  Marland Kitchen UVULOPALATOPHARYNGOPLASTY, TONSILLECTOMY AND SEPTOPLASTY  2002   Family History  Problem Relation Age of Onset  . Diabetes Mother   . Cancer Sister 40       male  cancer  . Cancer Brother 60       breast  . Coronary artery disease Neg Hx   . Stroke Neg Hx   . Colon cancer Neg Hx   . Pancreatic cancer Neg Hx   . Stomach cancer Neg Hx    Social History   Tobacco Use  . Smoking status: Never Smoker  . Smokeless tobacco: Never Used  Substance Use Topics  . Alcohol use: No    Alcohol/week: 0.0 oz  . Drug use: No    Review of Systems Per HPI    Objective:   Physical Exam  Constitutional: He is oriented to person, place, and time. He appears well-developed and well-nourished. No distress.  HENT:  Head: Normocephalic and atraumatic.  Right Ear: Tympanic membrane, external ear and ear canal normal.  Left Ear: Tympanic membrane, external ear and ear  canal normal.  Nose: Mucosal edema and rhinorrhea present.  Mouth/Throat: Uvula is midline and mucous membranes are normal. No oropharyngeal exudate, posterior oropharyngeal edema or posterior oropharyngeal erythema.  Eyes: Conjunctivae are normal.  Neck: Normal range of motion. Neck supple.  Cardiovascular: Normal rate, regular rhythm and normal heart sounds.  Pulmonary/Chest: Effort normal and breath sounds normal.  Lymphadenopathy:    He has no cervical adenopathy.  Neurological: He is alert and oriented to person, place, and time.  Skin: Skin is warm and dry. He is not diaphoretic.  Psychiatric: He has a normal mood and affect. His behavior is normal. Judgment and thought content normal.  Vitals reviewed.     BP 134/82   Pulse 82   Temp 98.2 F (36.8 C) (Oral)   Wt 227 lb 8 oz (103.2 kg)   SpO2 93%   BMI 31.73 kg/m  Wt Readings from Last 3 Encounters:  06/11/17 227 lb 8 oz (103.2 kg)  04/08/17 223 lb (101.2 kg)  11/03/16 205 lb (93 kg)       Assessment & Plan:  1. Lower respiratory infection -He is feeling a little bit better today and this may be viral, I have provided him a wait-and-see antibiotic prescription if he is not better in 48 hours - doxycycline (VIBRAMYCIN) 100 MG capsule; Take 1 capsule (100 mg total) by mouth 2 (two) times daily.  Dispense: 14 capsule; Refill: 0  2. Cough - Robitussin AC 100-10 mg/5 ml syrup for cough - Provided written and verbal information regarding diagnosis and treatment. - rtc/er precautions reviewd   Clarene Reamer, FNP-BC  [facility]  06/11/2017 6:07 PM

## 2017-06-11 NOTE — Patient Instructions (Signed)
Please take plain Mucinex to help thin secretions  If not better in 2 days, start antibiotic  Increase fluids

## 2017-09-04 ENCOUNTER — Other Ambulatory Visit: Payer: Self-pay | Admitting: Family Medicine

## 2017-10-01 ENCOUNTER — Other Ambulatory Visit: Payer: Self-pay | Admitting: Family Medicine

## 2017-11-02 ENCOUNTER — Other Ambulatory Visit: Payer: Self-pay | Admitting: Family Medicine

## 2017-11-03 ENCOUNTER — Other Ambulatory Visit: Payer: Self-pay | Admitting: Family Medicine

## 2017-11-03 DIAGNOSIS — E559 Vitamin D deficiency, unspecified: Secondary | ICD-10-CM

## 2017-11-03 DIAGNOSIS — R7303 Prediabetes: Secondary | ICD-10-CM

## 2017-11-03 DIAGNOSIS — E785 Hyperlipidemia, unspecified: Secondary | ICD-10-CM

## 2017-11-03 DIAGNOSIS — Z125 Encounter for screening for malignant neoplasm of prostate: Secondary | ICD-10-CM

## 2017-11-04 ENCOUNTER — Ambulatory Visit (INDEPENDENT_AMBULATORY_CARE_PROVIDER_SITE_OTHER): Payer: Medicare Other

## 2017-11-04 ENCOUNTER — Other Ambulatory Visit: Payer: Self-pay | Admitting: Family Medicine

## 2017-11-04 ENCOUNTER — Encounter: Payer: Self-pay | Admitting: Family Medicine

## 2017-11-04 ENCOUNTER — Telehealth: Payer: Self-pay

## 2017-11-04 VITALS — BP 112/90 | HR 66 | Temp 98.0°F | Ht 70.0 in | Wt 222.2 lb

## 2017-11-04 DIAGNOSIS — E785 Hyperlipidemia, unspecified: Secondary | ICD-10-CM

## 2017-11-04 DIAGNOSIS — E559 Vitamin D deficiency, unspecified: Secondary | ICD-10-CM | POA: Diagnosis not present

## 2017-11-04 DIAGNOSIS — R7303 Prediabetes: Secondary | ICD-10-CM

## 2017-11-04 DIAGNOSIS — Z125 Encounter for screening for malignant neoplasm of prostate: Secondary | ICD-10-CM

## 2017-11-04 DIAGNOSIS — Z0000 Encounter for general adult medical examination without abnormal findings: Secondary | ICD-10-CM

## 2017-11-04 LAB — LIPID PANEL
Cholesterol: 183 mg/dL (ref 0–200)
HDL: 46.1 mg/dL (ref >39.00)
LDL Cholesterol: 115 mg/dL — ABNORMAL HIGH (ref 0–99)
NONHDL: 137.03
Total CHOL/HDL Ratio: 4
Triglycerides: 110 mg/dL (ref 0.0–149.0)
VLDL: 22 mg/dL (ref 0.0–40.0)

## 2017-11-04 LAB — COMPREHENSIVE METABOLIC PANEL
ALT: 20 U/L (ref 0–53)
AST: 16 U/L (ref 0–37)
Albumin: 4.3 g/dL (ref 3.5–5.2)
Alkaline Phosphatase: 63 U/L (ref 39–117)
BILIRUBIN TOTAL: 0.5 mg/dL (ref 0.2–1.2)
BUN: 24 mg/dL — AB (ref 6–23)
CO2: 26 meq/L (ref 19–32)
Calcium: 9.5 mg/dL (ref 8.4–10.5)
Chloride: 104 mEq/L (ref 96–112)
Creatinine, Ser: 0.65 mg/dL (ref 0.40–1.50)
GFR: 129.97 mL/min (ref >60.00)
GLUCOSE: 108 mg/dL — AB (ref 70–99)
POTASSIUM: 4.4 meq/L (ref 3.5–5.1)
SODIUM: 139 meq/L (ref 135–145)
Total Protein: 7.1 g/dL (ref 6.0–8.3)

## 2017-11-04 LAB — VITAMIN D 25 HYDROXY (VIT D DEFICIENCY, FRACTURES): VITD: 21.39 ng/mL — AB (ref 30.00–100.00)

## 2017-11-04 LAB — HEMOGLOBIN A1C: HEMOGLOBIN A1C: 5.8 % (ref 4.6–6.5)

## 2017-11-04 LAB — PSA, MEDICARE: PSA: 1.38 ng/ml (ref 0.10–4.00)

## 2017-11-04 MED ORDER — OMEPRAZOLE 40 MG PO CPDR: 40.0000 mg | DELAYED_RELEASE_CAPSULE | Freq: Every day | ORAL | 0 refills | Status: DC

## 2017-11-04 MED ORDER — LISINOPRIL 2.5 MG PO TABS: 2.5000 mg | ORAL_TABLET | Freq: Every day | ORAL | 0 refills | Status: DC

## 2017-11-04 NOTE — Patient Instructions (Addendum)
Alexander [name] , Thank you for taking time to come for your Medicare Wellness Visit. I appreciate your ongoing commitment to your health goals. Please review the following plan we discussed and let me know if I can assist you in the future.   These are the goals we discussed: Goals    . Increase physical activity     Starting 11/04/2017, I will continue to exercise for at least 30 minutes in an effort to reach target weight of 200 lbs.        This is a list of the screening recommended for you and due dates:  Health Maintenance  Topic Date Due  . DTaP/Tdap/Td vaccine (1 - Tdap) 05/03/2022*  . Flu Shot  12/30/2017  . Colon Cancer Screening  10/20/2018  . Tetanus Vaccine  05/03/2022  .  Hepatitis C: One time screening is recommended by Center for Disease Control  (CDC) for  adults born from [birth year] through 1966.   Completed  . Pneumonia vaccines  Completed  *Topic was postponed. The date shown is not the original due date.   Preventive Care for Adults  A healthy lifestyle and preventive care can promote health and wellness. Preventive health guidelines for adults include the following key practices.  . A routine yearly physical is a good way to check with your health care provider about your health and preventive screening. It is a chance to share any concerns and updates on your health and to receive a thorough exam.  . Visit your dentist for a routine exam and preventive care every 6 months. Brush your teeth twice a day and floss once a day. Good oral hygiene prevents tooth decay and gum disease.  . The frequency of eye exams is based on your age, health, family medical history, use  of contact lenses, and other factors. Follow your health care provider's recommendations for frequency of eye exams.  . Eat a healthy diet. Foods like vegetables, fruits, whole grains, low-fat dairy products, and lean protein foods contain the nutrients you need without too many calories. Decrease your intake  of foods high in solid fats, added sugars, and salt. Eat the right amount of calories for you. Get information about a proper diet from your health care provider, if necessary.  . Regular physical exercise is one of the most important things you can do for your health. Most adults should get at least 150 minutes of moderate-intensity exercise (any activity that increases your heart rate and causes you to sweat) each week. In addition, most adults need muscle-strengthening exercises on 2 or more days a week.  Silver Sneakers may be a benefit available to you. To determine eligibility, you may visit the website: www.silversneakers.com or contact program at 630-351-3370 Mon-Fri between 8AM-8PM.   . Maintain a healthy weight. The body mass index (BMI) is a screening tool to identify possible weight problems. It provides an estimate of body fat based on height and weight. Your health care provider can find your BMI and can help you achieve or maintain a healthy weight.   For adults 20 years and older: ? A BMI below 18.5 is considered underweight. ? A BMI of 18.5 to 24.9 is normal. ? A BMI of 25 to 29.9 is considered overweight. ? A BMI of 30 and above is considered obese.   . Maintain normal blood lipids and cholesterol levels by exercising and minimizing your intake of saturated fat. Eat a balanced diet with plenty of fruit and vegetables. Blood tests  for lipids and cholesterol should begin at age 31 and be repeated every 5 years. If your lipid or cholesterol levels are high, you are over 50, or you are at high risk for heart disease, you may need your cholesterol levels checked more frequently. Ongoing high lipid and cholesterol levels should be treated with medicines if diet and exercise are not working.  . If you smoke, find out from your health care provider how to quit. If you do not use tobacco, please do not start.  . If you choose to drink alcohol, please do not consume more than 2 drinks  per day. One drink is considered to be 12 ounces (355 mL) of beer, 5 ounces (148 mL) of wine, or 1.5 ounces (44 mL) of liquor.  . If you are 20-41 years old, ask your health care provider if you should take aspirin to prevent strokes.  . Use sunscreen. Apply sunscreen liberally and repeatedly throughout the day. You should seek shade when your shadow is shorter than you. Protect yourself by wearing long sleeves, pants, a wide-brimmed hat, and sunglasses year round, whenever you are outdoors.  . Once a month, do a whole body skin exam, using a mirror to look at the skin on your back. Tell your health care provider of new moles, moles that have irregular borders, moles that are larger than a pencil eraser, or moles that have changed in shape or color.

## 2017-11-04 NOTE — Telephone Encounter (Signed)
Received email from pt's wife, Alexander [name], via his MyChart requesting refill for lisinopril be sent to [facility]. [See Pt email, 11/04/17.]    Sent refill. Notified pt via [facility].

## 2017-11-05 ENCOUNTER — Other Ambulatory Visit: Payer: Self-pay

## 2017-11-05 MED ORDER — OMEPRAZOLE 40 MG PO CPDR: 40.0000 mg | DELAYED_RELEASE_CAPSULE | Freq: Every day | ORAL | 0 refills | Status: DC

## 2017-11-05 MED ORDER — LISINOPRIL 2.5 MG PO TABS: 2.5000 mg | ORAL_TABLET | Freq: Every day | ORAL | 0 refills | Status: DC

## 2017-11-05 NOTE — Progress Notes (Signed)
Subjective:   Alexander [name] is a 67 y.o. male who presents for Medicare Annual/Subsequent preventive examination.  Review of Systems:  N/A Cardiac Risk Factors include: male gender;advanced age (>70men, >51 women);hypertension;dyslipidemia     Objective:    Vitals: BP 112/90 (BP Location: Right Arm, Patient Position: Sitting, Cuff Size: Normal)   Pulse 66   Temp 98 F (36.7 C) (Oral)   Ht 5\' 10"  (1.778 m) Comment: shoes  Wt 222 lb 4 oz (100.8 kg)   SpO2 94%   BMI 31.89 kg/m   Body mass index is 31.89 kg/m.  Advanced Directives 11/04/2017 10/30/2016 10/29/2015 05/15/2015 04/25/2015 04/25/2015  Does Patient Have a Medical Advance Directive? No No No No No No;Yes  Type of Advance Directive - - - - - Living will;Out of facility DNR (pink MOST or yellow form)  Would patient like information on creating a medical advance directive? No - Patient declined - No - patient declined information No - patient declined information No - patient declined information -    Tobacco Social History   Tobacco Use  Smoking Status Never Smoker  Smokeless Tobacco Never Used     Counseling given: No   Clinical Intake:  Pre-visit preparation completed: Yes  Pain Score: 4      Nutritional Status: BMI > 30  Obese Nutritional Risks: None Diabetes: No  How often do you need to have someone help you when you read instructions, pamphlets, or other written materials from your doctor or pharmacy?: 1 - Never What is the last grade level you completed in school?: 12th grade + technical school  Interpreter Needed?: No  Comments: pt lives with spouse Information entered by :: LPinson, LPN  Past Medical History:  Diagnosis Date  . Acute cholecystitis 03/2016   with sepsis  . Arthritis   . CAD S/P percutaneous coronary angioplasty 1995, 2000, 2005   a. 1995 s/p BMS;  b. 2000 s/p BMS;  c. 2005 s/p stent - All stents in [facility] (RCA, LAD & OM - unknown on which date);  d. 04/2015 NSTEMI/Cath: LM  nl, ost/p LAD 20%, patent mLAD stent, RI small, OM2 patent stent, pRCA 20%, patent stent, EF 45-50%-->Med Rx.  . Chronic pain syndrome    a. Followed @ [facility];  b. Uses 12-14 excedrins per day.  . Essential hypertension   . History of depression   . History of post poliomyelitis muscular atrophy   . HLD (hyperlipidemia)   . Ischemic cardiomyopathy    a. 04/2015 Echo: EF 40-45%, mod antsept, ant, antlat, apical HK, mild AI/MR, PASP 57mmHg.  . Migraines   . Neuropathy   . 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 NSTEMI (non-ST elevated myocardial infarction) ([place]) 04/2015   - Demand Ischemia in setting of Sepsis (also with prior MI history  . OSA (obstructive sleep apnea)    does not want to use CPAP, using mouth guard  . PAF (paroxysmal atrial fibrillation) ([place])    a. 04/2015 in setting of cholecystits and sepsis -->[facility]io;  b. CHA2DS2VASc = 4--> Eliquis.  . Pneumonia 04/2015  . Post-polio syndrome    a. ambulates with braces.  . Shoulder pain, right    Past Surgical History:  Procedure Laterality Date  . CARDIAC CATHETERIZATION N/A 04/30/2015   Procedure: Left Heart Cath and Coronary Angiography;  Surgeon: Troy Sine, MD;  Location: [facility];  Service: Cardiovascular; LM nl, ost/p LAD 20%, patent mLAD stent, RI  small, OM2 patent stent, pRCA 20%, patent stent, EF 45-50%-->Med Rx   . cervical spine stimulator  08/2012  . CHOLECYSTECTOMY  06/10/2015   Procedure: LAPAROSCOPIC CHOLECYSTECTOMY;  Surgeon: Mickeal Skinner, MD;  Location: [facility];  Service: General;;  . COLONOSCOPY  09/2013   TA x2, rpt 5 yrs ([facility])  . CORONARY ANGIOPLASTY WITH STENT PLACEMENT  1995, 2000, 2005   stents in RCA, LAD & Cx-OM. -Stents were patent by cardiac catheterization in November 2016  . lumbar spine stimulator    . TRANSTHORACIC ECHOCARDIOGRAM  04/2015   EF 40-45%, mod antsept, ant, antlat, apical HK, mild AI/MR, PASP 69mmHg.  Marland Kitchen UVULOPALATOPHARYNGOPLASTY, TONSILLECTOMY AND  SEPTOPLASTY  2002   Family History  Problem Relation Age of Onset  . Diabetes Mother   . Cancer Sister 60       male cancer  . Cancer Brother 60       breast  . Coronary artery disease Neg Hx   . Stroke Neg Hx   . Colon cancer Neg Hx   . Pancreatic cancer Neg Hx   . Stomach cancer Neg Hx    Social History   Socioeconomic History  . Marital status: Married    Spouse name: Not on file  . Number of children: Not on file  . Years of education: Not on file  . Highest education level: Not on file  Occupational History  . Not on file  Social Needs  . Financial resource strain: Not on file  . Food insecurity:    Worry: Not on file    Inability: Not on file  . Transportation needs:    Medical: Not on file    Non-medical: Not on file  Tobacco Use  . Smoking status: Never Smoker  . Smokeless tobacco: Never Used  Substance and Sexual Activity  . Alcohol use: No    Alcohol/week: 0.0 oz  . Drug use: No  . Sexual activity: Yes  Lifestyle  . Physical activity:    Days per week: Not on file    Minutes per session: Not on file  . Stress: Not on file  Relationships  . Social connections:    Talks on phone: Not on file    Gets together: Not on file    Attends religious service: Not on file    Active member of club or organization: Not on file    Attends meetings of clubs or organizations: Not on file    Relationship status: Not on file  Other Topics Concern  . 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 aspirin 81 MG chewable tablet Chew 1 tablet (81 mg total) by mouth daily.  Marland Kitchen atorvastatin (LIPITOR) 80 MG tablet Take 0.5  tablets (40 mg total) daily at 6 PM by mouth. (Patient taking differently: Take 40 mg by mouth every other day. )  . Cholecalciferol ([employer] VITAMIN D) 2000 UNITS CAPS Take 1 capsule (2,000 Units total) by mouth daily.  Marland Kitchen gabapentin (NEURONTIN) 600 MG tablet TAKE 2 TABLETS 3 TIMES A DAY  . Multiple Vitamins-Minerals (MULTIVITAMIN ADULTS 50+ PO) Take 1 tablet  by mouth daily.  . Omega-3 Fatty Acids (FISH OIL) 1000 MG CAPS Take 1 capsule by mouth daily.  . sildenafil (VIAGRA) 100 MG tablet TAKE ONE-HALF TO ONE TABLET BY MOUTH DAILY AS NEEDED FOR ERECTILE DYSFUNCTION  . vitamin C (ASCORBIC ACID) 500 MG tablet Take 1,000 mg by mouth daily.  . vitamin E 400 UNIT capsule Take 400 Units by mouth daily.  . [DISCONTINUED] lisinopril (PRINIVIL,ZESTRIL) 2.5 MG tablet TAKE ONE TABLET BY MOUTH  DAILY  . [DISCONTINUED] omeprazole (PRILOSEC) 40 MG capsule TAKE 1 CAPSULE BY MOUTH EVERY DAY  . [DISCONTINUED] doxycycline (VIBRAMYCIN) 100 MG capsule Take 1 capsule (100 mg total) by mouth 2 (two) times daily.  . [DISCONTINUED] guaiFENesin-codeine (ROBITUSSIN AC) 100-10 MG/5ML syrup Take 5 mLs by mouth 3 (three) times daily as needed for cough.   No facility-administered encounter medications on file as of 11/04/2017.     Activities of Daily Living In your present state of health, do you have any difficulty performing the following activities: 11/04/2017  Hearing? N  Vision? N  Difficulty concentrating or making decisions? N  Walking or climbing stairs? N  Dressing or bathing? N  Doing errands, shopping? N  Preparing Food and eating ? N  Using the Toilet? N  In the past six months, have you accidently leaked urine? N  Do you have problems with loss of bowel control? N  Managing your Medications? N  Managing your Finances? N  Housekeeping or managing your Housekeeping? N  Some recent data might be hidden    Patient Care Team: [name], MD as PCP - General (Family Medicine) Leonie Man, MD as  Consulting Physician (Cardiology) Wilhelmina Mcardle, MD as Consulting Physician (Pulmonary Disease)   Assessment:   This is a routine wellness examination for Alexander [name].   Hearing Screening   125Hz  250Hz  500Hz  1000Hz  2000Hz  3000Hz  4000Hz  6000Hz  8000Hz   Right ear:   40 40 40  40    Left ear:   40 40 40  0      Visual Acuity Screening   Right eye Left eye Both eyes  Without correction: 20/25-2 20/30-1 20/30  With correction:      Exercise Activities and Dietary recommendations Current Exercise Habits: Home exercise routine, Type of exercise: strength training/weights, Time (Minutes): 30, Frequency (Times/Week): 7, Weekly Exercise (Minutes/Week): 210, Intensity: Mild, Exercise limited by: orthopedic condition(s)  Goals    . Increase physical activity     Starting 11/04/2017, I will continue to exercise for at least 30 minutes in an effort to reach target weight of 200 lbs.        Fall Risk Fall Risk  11/04/2017 12/21/2016 10/30/2016 10/29/2015 07/12/2014  Falls in the past year? Yes No Yes Yes No  Comment - Emmi Telephone Survey: data to providers prior to load due to pt's health status, he has multiple falls pt often falls in yard while doing yardwork -  Number falls in past yr: 1 - 2 or more 2 or more -  Injury with Fall? Yes - No No -  Risk Factor Category  - - High Fall Risk High Fall Risk -  Risk for fall due to : - - History of fall(s);Impaired balance/gait;Impaired mobility - -  Risk for fall due to: Comment - - - - -  Follow up - - - Falls evaluation completed -   Depression Screen PHQ 2/9 Scores 11/04/2017 10/30/2016 10/29/2015 07/12/2014  PHQ - 2 Score 0 0 0 0  PHQ- 9  Score 5 - - -    Cognitive Function MMSE - Mini Mental State Exam 11/04/2017 10/30/2016 10/29/2015  Orientation to time 5 5 5   Orientation to Place 5 5 5   Registration 3 3 3   Attention/ Calculation 0 0 0  Recall 2 3 3   Recall-comments unable to recall 1 of 3 words - -  Language- name 2 objects 0 0 0  Language- repeat 1 1  1   Language- follow 3 step command 3 3 3   Language- read & follow direction 0 0 0  Write a sentence 0 0 0  Copy design 0 0 0  Total score 19 20 20      PLEASE NOTE: A Mini-Cog screen was completed. Maximum score is 20. A value of 0 denotes this part of Folstein MMSE was not completed or the patient failed this part of the Mini-Cog screening.   Mini-Cog Screening Orientation to Time - Max 5 pts Orientation to Place - Max 5 pts Registration - Max 3 pts Recall - Max 3 pts Language Repeat - Max 1 pts Language Follow 3 Step Command - Max 3 pts     Immunization History  Administered Date(s) Administered  . Influenza, High Dose Seasonal PF 03/04/2016  . Influenza, Seasonal, Injecte, Preservative Fre 05/03/2012, 05/05/2014, 02/28/2015  . Influenza,inj,Quad PF,6+ Mos 02/27/2013  . Pneumococcal Conjugate-13 04/28/2015  . Pneumococcal Polysaccharide-23 10/30/2016  . Td 05/03/2012  . Zoster 06/29/2013    Screening Tests Health Maintenance  Topic Date Due  . DTaP/Tdap/Td (1 - Tdap) 05/03/2022 (Originally 05/04/2012)  . INFLUENZA VACCINE  12/30/2017  . COLONOSCOPY  10/20/2018  . TETANUS/TDAP  05/03/2022  . Hepatitis C Screening  Completed  . PNA vac Low Risk Adult  Completed      Plan:      I have personally reviewed, addressed, and noted the following in the patient's chart:  A. Medical and social history B. Use of alcohol, tobacco or illicit drugs  C. Current medications and supplements D. Functional ability and status E.  Nutritional status F.  Physical activity G. Advance directives H. List of other physicians I.  Hospitalizations, surgeries, and ER visits in previous 12 months J.  [place] to include hearing, vision, cognitive, depression L. Referrals and appointments - none  In addition, I have reviewed and discussed with patient certain preventive protocols, quality metrics, and best practice recommendations. A written personalized care plan for preventive  services as well as general preventive health recommendations were provided to patient.  See attached scanned questionnaire for additional information.   Signed,   Lindell Noe, MHA, BS, LPN Health Coach

## 2017-11-05 NOTE — Telephone Encounter (Signed)
Refill request made during [facility] on 11/04/17 for Lisinopril and Omeprazole.

## 2017-11-05 NOTE — Progress Notes (Signed)
PCP notes:   Health maintenance:  No gaps identified.  Abnormal screenings:   Hearing - failed  Hearing Screening   125Hz  250Hz  500Hz  1000Hz  2000Hz  3000Hz  4000Hz  6000Hz  8000Hz   Right ear:   40 40 40  40    Left ear:   40 40 40  0     Depression score: 5 Depression screen [facility] 2/9 11/04/2017 10/30/2016 10/29/2015 07/12/2014 06/29/2013  Decreased Interest 0 0 0 0 0  Down, Depressed, Hopeless 0 0 0 0 0  PHQ - 2 Score 0 0 0 0 0  Altered sleeping 2 - - - -  Tired, decreased energy 2 - - - -  Change in appetite 1 - - - -  Feeling bad or failure about yourself  0 - - - -  Trouble concentrating 0 - - - -  Moving slowly or fidgety/restless 0 - - - -  Suicidal thoughts 0 - - - -  PHQ-9 Score 5 - - - -  Difficult doing work/chores Somewhat difficult - - - -    Mini-Cog score: 19/20 MMSE - Mini Mental State Exam 11/04/2017 10/30/2016 10/29/2015  Orientation to time 5 5 5   Orientation to Place 5 5 5   Registration 3 3 3   Attention/ Calculation 0 0 0  Recall 2 3 3   Recall-comments unable to recall 1 of 3 words - -  Language- name 2 objects 0 0 0  Language- repeat 1 1 1   Language- follow 3 step command 3 3 3   Language- read & follow direction 0 0 0  Write a sentence 0 0 0  Copy design 0 0 0  Total score 19 20 20    Fall risk: hx of single fall with injury Fall Risk  11/04/2017 12/21/2016 10/30/2016 10/29/2015 07/12/2014  Falls in the past year? Yes No Yes Yes No  Comment - Emmi Telephone Survey: data to providers prior to load due to pt's health status, he has multiple falls pt often falls in yard while doing yardwork -  Number falls in past yr: 1 - 2 or more 2 or more -  Injury with Fall? Yes - No No -  Risk Factor Category  - - High Fall Risk High Fall Risk -  Risk for fall due to : - - History of fall(s);Impaired balance/gait;Impaired mobility - -  Risk for fall due to: Comment - - - - -  Follow up - - - Falls evaluation completed -   Patient concerns:   Medication refill request. PCP  notified.  Nurse concerns:   None  Next PCP appt:   11/11/17 @ 1130

## 2017-11-08 ENCOUNTER — Other Ambulatory Visit: Payer: Self-pay | Admitting: Family Medicine

## 2017-11-08 NOTE — Telephone Encounter (Signed)
Last filled:  08/15/17, #540 Last OV:  06/11/17, acute with Debbie Next OV:  11/11/17

## 2017-11-08 NOTE — Progress Notes (Signed)
I reviewed health advisor's note, was available for consultation, and agree with documentation and plan.  

## 2017-11-11 ENCOUNTER — Encounter: Payer: Self-pay | Admitting: Family Medicine

## 2017-11-11 ENCOUNTER — Ambulatory Visit (INDEPENDENT_AMBULATORY_CARE_PROVIDER_SITE_OTHER): Payer: Medicare Other | Admitting: Family Medicine

## 2017-11-11 VITALS — BP 128/80 | HR 88 | Temp 98.0°F | Ht 70.0 in | Wt 225.0 lb

## 2017-11-11 DIAGNOSIS — G14 Postpolio syndrome: Secondary | ICD-10-CM | POA: Diagnosis not present

## 2017-11-11 DIAGNOSIS — I10 Essential (primary) hypertension: Secondary | ICD-10-CM | POA: Diagnosis not present

## 2017-11-11 DIAGNOSIS — E669 Obesity, unspecified: Secondary | ICD-10-CM | POA: Diagnosis not present

## 2017-11-11 DIAGNOSIS — E785 Hyperlipidemia, unspecified: Secondary | ICD-10-CM | POA: Diagnosis not present

## 2017-11-11 DIAGNOSIS — Z7189 Other specified counseling: Secondary | ICD-10-CM | POA: Diagnosis not present

## 2017-11-11 DIAGNOSIS — E559 Vitamin D deficiency, unspecified: Secondary | ICD-10-CM

## 2017-11-11 DIAGNOSIS — I480 Paroxysmal atrial fibrillation: Secondary | ICD-10-CM | POA: Diagnosis not present

## 2017-11-11 DIAGNOSIS — G894 Chronic pain syndrome: Secondary | ICD-10-CM

## 2017-11-11 DIAGNOSIS — K219 Gastro-esophageal reflux disease without esophagitis: Secondary | ICD-10-CM

## 2017-11-11 DIAGNOSIS — R7303 Prediabetes: Secondary | ICD-10-CM

## 2017-11-11 MED ORDER — ASPIRIN-ACETAMINOPHEN-CAFFEINE 250-250-65 MG PO TABS: 1.0000 | ORAL_TABLET | Freq: Four times a day (QID) | ORAL | 0 refills | Status: DC | PRN

## 2017-11-11 MED ORDER — IBUPROFEN 200 MG PO TABS: 600.0000 mg | ORAL_TABLET | Freq: Two times a day (BID) | ORAL | 0 refills | Status: DC | PRN

## 2017-11-11 MED ORDER — GABAPENTIN 600 MG PO TABS: ORAL_TABLET | ORAL | 3 refills | Status: DC

## 2017-11-11 MED ORDER — MAGNESIUM 250 MG PO TABS: 1.0000 | ORAL_TABLET | Freq: Every day | ORAL | 0 refills | Status: AC

## 2017-11-11 NOTE — Patient Instructions (Addendum)
If interested, check with pharmacy about new 2 shot shingles series (shingrix).  I think GERD is causing burning discomfort - avoid of citrus, fatty foods, chocolate, peppermint, and excessive alcohol, along with sodas, orange juice (acidic drinks) At least a few hours between dinner and bed, minimize naps after eating. Good to see you today, call us with questions. Return as needed or in 1 year for follow up visit.

## 2017-11-11 NOTE — Progress Notes (Signed)
BP 128/80 (BP Location: Right Arm, Patient Position: Sitting, Cuff Size: Large)   Pulse 88   Temp 98 F (36.7 C) (Oral)   Ht 5\' 10"  (1.778 m)   Wt 225 lb (102.1 kg)   SpO2 96%   BMI 32.28 kg/m    CC: AMW f/u visit Subjective:    Patient ID: Alexander [name], male    DOB: [birth year], 67 y.o.   MRN: [id number]  HPI: Alexander [name] is a 67 y.o. male presenting on 11/11/2017 for Annual Exam (Pt 2.)   Saw Katha Cabal last week for medicare wellness visit. Note reviewed. Fall risk due to post-polio syndrome - last fall 05/2017 during snow.  Lactose intolerance - loose stools after ice cream last night - requests DRE deferred.   Some burning epigastric discomfort - significant excedrin use, tomato and orange juice, likes spicy foods.   Preventative: COLONOSCOPY Date: 09/2013 TA x2, rpt 5 yrs ([facility]).  Prostate cancer screening - desires continued screening. Nocturia x2-3.  Flu shot yearly Td 2013 Prevnar 2016, pnuemovax 2018.  zostavax - 06/2013 shingrix - discussed.  Advanced directives: to set up at home. Wife would be HCPOA. Does not want prolonged life support. Wants to be cremated. Seat belt use discussed Sunscreen use discussed. No changing moles on skin.  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 Interim medical history since our last visit reviewed. Allergies and medications reviewed and updated. Outpatient Medications Prior to Visit  Medication Sig Dispense Refill  . aspirin 81 MG chewable tablet Chew 1 tablet (81 mg total) by  mouth daily. 30 tablet 0  . atorvastatin (LIPITOR) 80 MG tablet Take 0.5 tablets (40 mg total) daily at 6 PM by mouth. (Patient taking differently: Take 40 mg by mouth every other day. ) 90 tablet 3  . Cholecalciferol ([employer] VITAMIN D) 2000 UNITS CAPS Take 1 capsule (2,000 Units total) by mouth daily.    Marland Kitchen lisinopril (PRINIVIL,ZESTRIL) 2.5 MG tablet Take 1 tablet (2.5 mg total) by mouth daily. 90 tablet 0  . Multiple Vitamins-Minerals (MULTIVITAMIN ADULTS 50+ PO) Take 1 tablet by mouth daily.    . Omega-3 Fatty Acids (FISH OIL) 1000 MG CAPS Take 1 capsule by mouth daily.    Marland Kitchen omeprazole (PRILOSEC) 40 MG capsule Take 1 capsule (40 mg total) by mouth daily. 90 capsule 0  . sildenafil (VIAGRA) 100 MG tablet TAKE ONE-HALF TO ONE TABLET BY MOUTH DAILY AS NEEDED FOR ERECTILE DYSFUNCTION 6 tablet 0  . vitamin C (ASCORBIC ACID) 500 MG tablet Take 1,000 mg by mouth daily.    . vitamin E 400 UNIT capsule Take 400 Units by mouth daily.    Marland Kitchen gabapentin (NEURONTIN) 600 MG tablet TAKE 2 TABLETS 3 TIMES A DAY 540 tablet 0   No facility-administered medications prior to visit.      Per HPI unless specifically indicated in ROS section below Review of Systems     Objective:    BP 128/80 (BP Location: Right Arm, Patient Position: Sitting, Cuff Size: Large)   Pulse 88   Temp 98 F (36.7 C) (  Oral)   Ht 5\' 10"  (1.778 m)   Wt 225 lb (102.1 kg)   SpO2 96%   BMI 32.28 kg/m   Wt Readings from Last 3 Encounters:  11/11/17 225 lb (102.1 kg)  11/04/17 222 lb 4 oz (100.8 kg)  06/11/17 227 lb 8 oz (103.2 kg)    Physical Exam  Constitutional: He is oriented to person, place, and time. He appears well-developed and well-nourished. No distress.  HENT:  Head: Normocephalic and atraumatic.  Right Ear: Hearing, tympanic membrane, external ear and ear canal normal.  Left Ear: Hearing, tympanic membrane, external ear and ear canal normal.  Nose: Nose normal.  Mouth/Throat: Uvula is midline, oropharynx is clear and  moist and mucous membranes are normal. No oropharyngeal exudate, posterior oropharyngeal edema or posterior oropharyngeal erythema.  Eyes: Pupils are equal, round, and reactive to light. Conjunctivae and EOM are normal. No scleral icterus.  Neck: Normal range of motion. Neck supple. No thyromegaly present.  Cardiovascular: Normal rate, regular rhythm, normal heart sounds and intact distal pulses.  No murmur heard. Pulses:      Radial pulses are 2+ on the right side, and 2+ on the left side.  Pulmonary/Chest: Effort normal and breath sounds normal. No respiratory distress. He has no wheezes. He has no rales.  Abdominal: Soft. Bowel sounds are normal. He exhibits no distension and no mass. There is no tenderness. There is no rebound and no guarding.  Genitourinary:  Genitourinary Comments: DRE deferred today  Musculoskeletal: Normal range of motion. He exhibits no edema.  Wears leg braces  Lymphadenopathy:    He has no cervical adenopathy.  Neurological: He is alert and oriented to person, place, and time.  CN grossly intact, station and gait intact  Skin: Skin is warm and dry. No rash noted.  Psychiatric: He has a normal mood and affect. His behavior is normal. Judgment and thought content normal.  Nursing note and vitals reviewed.  Results for orders placed or performed in visit on 11/04/17  PSA, Medicare  Result Value Ref Range   PSA 1.38 0.10 - 4.00 ng/ml  VITAMIN D 25 Hydroxy (Vit-D Deficiency, Fractures)  Result Value Ref Range   VITD 21.39 (L) 30.00 - 100.00 ng/mL  Hemoglobin A1c  Result Value Ref Range   Hgb A1c MFr Bld 5.8 4.6 - 6.5 %  Comprehensive metabolic panel  Result Value Ref Range   Sodium 139 135 - 145 mEq/L   Potassium 4.4 3.5 - 5.1 mEq/L   Chloride 104 96 - 112 mEq/L   CO2 26 19 - 32 mEq/L   Glucose, Bld 108 (H) 70 - 99 mg/dL   BUN 24 (H) 6 - 23 mg/dL   Creatinine, Ser 0.65 0.40 - 1.50 mg/dL   Total Bilirubin 0.5 0.2 - 1.2 mg/dL   Alkaline Phosphatase 63 39  - 117 U/L   AST 16 0 - 37 U/L   ALT 20 0 - 53 U/L   Total Protein 7.1 6.0 - 8.3 g/dL   Albumin 4.3 3.5 - 5.2 g/dL   Calcium 9.5 8.4 - 10.5 mg/dL   GFR 129.97 >60.00 mL/min  Lipid panel  Result Value Ref Range   Cholesterol 183 0 - 200 mg/dL   Triglycerides 110.0 0.0 - 149.0 mg/dL   HDL 46.10 >39.00 mg/dL   VLDL 22.0 0.0 - 40.0 mg/dL   LDL Cholesterol 115 (H) 0 - 99 mg/dL   Total CHOL/HDL Ratio 4    NonHDL 137.03    Lab Results  Component Value Date   TSH 0.529 04/25/2015    Depression screen [facility] 2/9 11/04/2017 10/30/2016 10/29/2015 07/12/2014 06/29/2013  Decreased Interest 0 0 0 0 0  Down, Depressed, Hopeless 0 0 0 0 0  PHQ - 2 Score 0 0 0 0 0  Altered sleeping 2 - - - -  Tired, decreased energy 2 - - - -  Change in appetite 1 - - - -  Feeling bad or failure about yourself  0 - - - -  Trouble concentrating 0 - - - -  Moving slowly or fidgety/restless 0 - - - -  Suicidal thoughts 0 - - - -  PHQ-9 Score 5 - - - -  Difficult doing work/chores Somewhat difficult - - - -       Assessment & Plan:   Problem List Items Addressed This Visit    Vitamin D deficiency    Levels remain low - encouraged ongoing vit D supplementation.       Prediabetes    Stable A1c. Continue to encourage limiting added sugars in diet.       Post-polio syndrome    Chronic, stable. Fall risk due to this.       PAF (paroxysmal atrial fibrillation) ([place]); CHA2DS2VASc = 4--> Eliquis. (Chronic)    Thought triggered from stress of acute illness 206 (cholecystitis with sepsis), no recurrent episodes. Now off anticoagulant, off beta blocker. Continue to monitor. Continue aspirin. Significant excedrin use.       Obesity, Class I, BMI 30-34.9    Encouraged healthy diet choices for sustainable weight loss.       Hyperlipidemia with target LDL less than 70 (Chronic)    Has been taking atorvastatin 40mg  (1/2 tab 80mg ) every other day. Lipid levels have deteriorated. I have asked him to start taking  atorvastatin 40mg  (1/2 tab 80mg ) every day.  The ASCVD Risk score Mikey Bussing DC Jr., et al., 2013) failed to calculate for the following reasons:   The patient has a prior MI or stroke diagnosis       GERD (gastroesophageal reflux disease) - Primary    Describes some GERD symptoms - reviewed lifestyle and dietary choices to control symptoms.       Essential hypertension (Chronic)    Chronic, stable only needing very low dose lisinopril. Continue to monitor.       Chronic pain syndrome    Presumed from post polio syndrome or sequelae, along with likely OA. Continue high dose gabapentin. Also continues 6 excedrin a day for chronic body aches. Did not like other trial medications including muscle relaxants and plain tylenol or NSAID.       Advanced care planning/counseling discussion    Advanced directives: to set up at home. Wife would be HCPOA. Does not want prolonged life support. Wants to be cremated.          Meds ordered this encounter  Medications  . gabapentin (NEURONTIN) 600 MG tablet    Sig: TAKE 2 TABLETS 3 TIMES A DAY    Dispense:  540 tablet    Refill:  3  . ibuprofen (ADVIL) 200 MG tablet    Sig: Take 3 tablets (600 mg total) by mouth 2 (two) times daily as needed.    Refill:  0  . aspirin-acetaminophen-caffeine (EXCEDRIN MIGRAINE) 250-250-65 MG tablet    Sig: Take 1 tablet by mouth every 6 (six) hours as needed for headache. Takes 6 tablets daily    Refill:  0  . Magnesium 250  MG TABS    Sig: Take 1 tablet (250 mg total) by mouth at bedtime.    Refill:  0   No orders of the defined types were placed in this encounter.   Follow up plan: Return in about 1 year (around 11/12/2018) for medicare wellness visit, follow up visit.  [name], MD

## 2017-11-13 ENCOUNTER — Encounter: Payer: Self-pay | Admitting: Family Medicine

## 2017-11-13 DIAGNOSIS — K219 Gastro-esophageal reflux disease without esophagitis: Secondary | ICD-10-CM | POA: Insufficient documentation

## 2017-11-13 NOTE — Assessment & Plan Note (Signed)
Advanced directives: to set up at home. Wife would be HCPOA. Does not want prolonged life support. Wants to be cremated.

## 2017-11-13 NOTE — Assessment & Plan Note (Deleted)
Chronic, stable only needing very low dose lisinopril. Continue to monitor.

## 2017-11-13 NOTE — Assessment & Plan Note (Signed)
Chronic, stable only needing very low dose lisinopril. Continue to monitor.

## 2017-11-13 NOTE — Assessment & Plan Note (Addendum)
Thought triggered from stress of acute illness 206 (cholecystitis with sepsis), no recurrent episodes. Now off anticoagulant, off beta blocker. Continue to monitor. Continue aspirin. Significant excedrin use.

## 2017-11-13 NOTE — Assessment & Plan Note (Signed)
Describes some GERD symptoms - reviewed lifestyle and dietary choices to control symptoms.

## 2017-11-13 NOTE — Assessment & Plan Note (Signed)
Levels remain low - encouraged ongoing vit D supplementation.

## 2017-11-13 NOTE — Assessment & Plan Note (Signed)
Encouraged healthy diet choices for sustainable weight loss.

## 2017-11-13 NOTE — Assessment & Plan Note (Signed)
Chronic, stable. Fall risk due to this.

## 2017-11-13 NOTE — Assessment & Plan Note (Signed)
Stable A1c. Continue to encourage limiting added sugars in diet.

## 2017-11-13 NOTE — Assessment & Plan Note (Addendum)
Presumed from post polio syndrome or sequelae, along with likely OA. Continue high dose gabapentin. Also continues 6 excedrin a day for chronic body aches. Did not like other trial medications including muscle relaxants and plain tylenol or NSAID.

## 2017-11-13 NOTE — Assessment & Plan Note (Signed)
Has been taking atorvastatin 40mg  (1/2 tab 80mg ) every other day. Lipid levels have deteriorated. I have asked him to start taking atorvastatin 40mg  (1/2 tab 80mg ) every day.  The ASCVD Risk score Mikey Bussing DC Jr., et al., 2013) failed to calculate for the following reasons:   The patient has a prior MI or stroke diagnosis

## 2017-12-01 ENCOUNTER — Other Ambulatory Visit: Payer: Self-pay | Admitting: Family Medicine

## 2018-01-07 ENCOUNTER — Emergency Department (HOSPITAL_COMMUNITY): Payer: Medicare Other

## 2018-01-07 ENCOUNTER — Inpatient Hospital Stay (HOSPITAL_COMMUNITY)
Admission: EM | Admit: 2018-01-07 | Discharge: 2018-01-11 | DRG: 871 | Disposition: A | Discharge status: Home / Self Care | Payer: Medicare Other | Attending: Internal Medicine | Admitting: Internal Medicine

## 2018-01-07 ENCOUNTER — Encounter (HOSPITAL_COMMUNITY): Payer: Self-pay | Admitting: Emergency Medicine

## 2018-01-07 ENCOUNTER — Other Ambulatory Visit: Payer: Self-pay

## 2018-01-07 DIAGNOSIS — R1084 Generalized abdominal pain: Secondary | ICD-10-CM | POA: Diagnosis not present

## 2018-01-07 DIAGNOSIS — K315 Obstruction of duodenum: Secondary | ICD-10-CM | POA: Diagnosis not present

## 2018-01-07 DIAGNOSIS — I509 Heart failure, unspecified: Secondary | ICD-10-CM | POA: Diagnosis present

## 2018-01-07 DIAGNOSIS — I110 Hypertensive heart disease with heart failure: Secondary | ICD-10-CM | POA: Diagnosis present

## 2018-01-07 DIAGNOSIS — K219 Gastro-esophageal reflux disease without esophagitis: Secondary | ICD-10-CM | POA: Diagnosis not present

## 2018-01-07 DIAGNOSIS — R932 Abnormal findings on diagnostic imaging of liver and biliary tract: Secondary | ICD-10-CM | POA: Diagnosis not present

## 2018-01-07 DIAGNOSIS — E876 Hypokalemia: Secondary | ICD-10-CM | POA: Diagnosis present

## 2018-01-07 DIAGNOSIS — K8033 Calculus of bile duct with acute cholangitis with obstruction: Secondary | ICD-10-CM

## 2018-01-07 DIAGNOSIS — R112 Nausea with vomiting, unspecified: Secondary | ICD-10-CM | POA: Diagnosis not present

## 2018-01-07 DIAGNOSIS — R17 Unspecified jaundice: Secondary | ICD-10-CM | POA: Diagnosis not present

## 2018-01-07 DIAGNOSIS — R7881 Bacteremia: Secondary | ICD-10-CM | POA: Diagnosis not present

## 2018-01-07 DIAGNOSIS — I255 Ischemic cardiomyopathy: Secondary | ICD-10-CM | POA: Diagnosis present

## 2018-01-07 DIAGNOSIS — M6250 Muscle wasting and atrophy, not elsewhere classified, unspecified site: Secondary | ICD-10-CM | POA: Diagnosis present

## 2018-01-07 DIAGNOSIS — R6521 Severe sepsis with septic shock: Secondary | ICD-10-CM | POA: Diagnosis present

## 2018-01-07 DIAGNOSIS — K8050 Calculus of bile duct without cholangitis or cholecystitis without obstruction: Secondary | ICD-10-CM | POA: Diagnosis not present

## 2018-01-07 DIAGNOSIS — K828 Other specified diseases of gallbladder: Secondary | ICD-10-CM | POA: Diagnosis not present

## 2018-01-07 DIAGNOSIS — Z955 Presence of coronary angioplasty implant and graft: Secondary | ICD-10-CM

## 2018-01-07 DIAGNOSIS — I2510 Atherosclerotic heart disease of native coronary artery without angina pectoris: Secondary | ICD-10-CM | POA: Diagnosis present

## 2018-01-07 DIAGNOSIS — K8309 Other cholangitis: Secondary | ICD-10-CM | POA: Diagnosis present

## 2018-01-07 DIAGNOSIS — G14 Postpolio syndrome: Secondary | ICD-10-CM | POA: Diagnosis present

## 2018-01-07 DIAGNOSIS — I480 Paroxysmal atrial fibrillation: Secondary | ICD-10-CM | POA: Diagnosis present

## 2018-01-07 DIAGNOSIS — A419 Sepsis, unspecified organism: Secondary | ICD-10-CM | POA: Diagnosis present

## 2018-01-07 DIAGNOSIS — R110 Nausea: Secondary | ICD-10-CM | POA: Diagnosis not present

## 2018-01-07 DIAGNOSIS — G629 Polyneuropathy, unspecified: Secondary | ICD-10-CM | POA: Diagnosis present

## 2018-01-07 DIAGNOSIS — K8021 Calculus of gallbladder without cholecystitis with obstruction: Secondary | ICD-10-CM | POA: Diagnosis not present

## 2018-01-07 DIAGNOSIS — Z9049 Acquired absence of other specified parts of digestive tract: Secondary | ICD-10-CM | POA: Diagnosis not present

## 2018-01-07 DIAGNOSIS — I252 Old myocardial infarction: Secondary | ICD-10-CM | POA: Diagnosis not present

## 2018-01-07 DIAGNOSIS — E785 Hyperlipidemia, unspecified: Secondary | ICD-10-CM | POA: Diagnosis present

## 2018-01-07 DIAGNOSIS — K8020 Calculus of gallbladder without cholecystitis without obstruction: Secondary | ICD-10-CM

## 2018-01-07 DIAGNOSIS — I10 Essential (primary) hypertension: Secondary | ICD-10-CM | POA: Diagnosis not present

## 2018-01-07 DIAGNOSIS — G4733 Obstructive sleep apnea (adult) (pediatric): Secondary | ICD-10-CM | POA: Diagnosis present

## 2018-01-07 DIAGNOSIS — R0602 Shortness of breath: Secondary | ICD-10-CM | POA: Diagnosis not present

## 2018-01-07 DIAGNOSIS — R001 Bradycardia, unspecified: Secondary | ICD-10-CM | POA: Diagnosis not present

## 2018-01-07 HISTORY — DX: Severe sepsis with septic shock: R65.21

## 2018-01-07 HISTORY — DX: Sepsis, unspecified organism: A41.9

## 2018-01-07 LAB — URINALYSIS, ROUTINE W REFLEX MICROSCOPIC
BACTERIA UA: NONE SEEN
Glucose, UA: NEGATIVE mg/dL
Ketones, ur: 80 mg/dL — AB
Leukocytes, UA: NEGATIVE
NITRITE: NEGATIVE
Protein, ur: 30 mg/dL — AB
SPECIFIC GRAVITY, URINE: 1.017 (ref 1.005–1.030)
pH: 7 (ref 5.0–8.0)

## 2018-01-07 LAB — BASIC METABOLIC PANEL
ANION GAP: 12 (ref 5–15)
BUN: 13 mg/dL (ref 8–23)
CALCIUM: 8 mg/dL — AB (ref 8.9–10.3)
CO2: 17 mmol/L — AB (ref 22–32)
Chloride: 109 mmol/L (ref 98–111)
Creatinine, Ser: 0.83 mg/dL (ref 0.61–1.24)
GFR calc Af Amer: >60 mL/min (ref >60)
GLUCOSE: 100 mg/dL — AB (ref 70–99)
Potassium: 2.5 mmol/L — CL (ref 3.5–5.1)
Sodium: 138 mmol/L (ref 135–145)

## 2018-01-07 LAB — GLUCOSE, CAPILLARY: GLUCOSE-CAPILLARY: 101 mg/dL — AB (ref 70–99)

## 2018-01-07 LAB — COMPREHENSIVE METABOLIC PANEL
ALK PHOS: 138 U/L — AB (ref 38–126)
ALT: 267 U/L — AB (ref 0–44)
AST: 146 U/L — AB (ref 15–41)
Albumin: 4 g/dL (ref 3.5–5.0)
Anion gap: 18 — ABNORMAL HIGH (ref 5–15)
BILIRUBIN TOTAL: 6.1 mg/dL — AB (ref 0.3–1.2)
BUN: 14 mg/dL (ref 8–23)
CALCIUM: 9.7 mg/dL (ref 8.9–10.3)
CHLORIDE: 100 mmol/L (ref 98–111)
CO2: 18 mmol/L — ABNORMAL LOW (ref 22–32)
CREATININE: 0.76 mg/dL (ref 0.61–1.24)
GFR calc Af Amer: >60 mL/min (ref >60)
Glucose, Bld: 116 mg/dL — ABNORMAL HIGH (ref 70–99)
Potassium: 3.3 mmol/L — ABNORMAL LOW (ref 3.5–5.1)
Sodium: 136 mmol/L (ref 135–145)
Total Protein: 7.5 g/dL (ref 6.5–8.1)

## 2018-01-07 LAB — BLOOD CULTURE ID PANEL (REFLEXED)
Acinetobacter baumannii: NOT DETECTED
CANDIDA GLABRATA: NOT DETECTED
CANDIDA KRUSEI: NOT DETECTED
CANDIDA PARAPSILOSIS: NOT DETECTED
Candida albicans: NOT DETECTED
Candida tropicalis: NOT DETECTED
Carbapenem resistance: NOT DETECTED
ENTEROBACTER CLOACAE COMPLEX: NOT DETECTED
ENTEROBACTERIACEAE SPECIES: DETECTED — AB
Enterococcus species: NOT DETECTED
Escherichia coli: DETECTED — AB
Haemophilus influenzae: NOT DETECTED
Klebsiella oxytoca: NOT DETECTED
Klebsiella pneumoniae: NOT DETECTED
Listeria monocytogenes: NOT DETECTED
Neisseria meningitidis: NOT DETECTED
Proteus species: NOT DETECTED
Pseudomonas aeruginosa: NOT DETECTED
STAPHYLOCOCCUS SPECIES: NOT DETECTED
STREPTOCOCCUS PYOGENES: NOT DETECTED
Serratia marcescens: NOT DETECTED
Staphylococcus aureus (BCID): NOT DETECTED
Streptococcus agalactiae: NOT DETECTED
Streptococcus pneumoniae: NOT DETECTED
Streptococcus species: NOT DETECTED

## 2018-01-07 LAB — CBC WITH DIFFERENTIAL/PLATELET
BASOS ABS: 0 10*3/uL (ref 0.0–0.1)
Basophils Relative: 0 %
Eosinophils Absolute: 0.2 10*3/uL (ref 0.0–0.7)
Eosinophils Relative: 1 %
HEMATOCRIT: 43.7 % (ref 39.0–52.0)
Hemoglobin: 15 g/dL (ref 13.0–17.0)
LYMPHS ABS: 0.5 10*3/uL — AB (ref 0.7–4.0)
Lymphocytes Relative: 3 %
MCH: 31.9 pg (ref 26.0–34.0)
MCHC: 34.3 g/dL (ref 30.0–36.0)
MCV: 93 fL (ref 78.0–100.0)
MONO ABS: 0.4 10*3/uL (ref 0.1–1.0)
MONOS PCT: 2 %
NEUTROS ABS: 17.2 10*3/uL — AB (ref 1.7–7.7)
Neutrophils Relative %: 94 %
Platelets: 206 10*3/uL (ref 150–400)
RBC: 4.7 MIL/uL (ref 4.22–5.81)
RDW: 13.1 % (ref 11.5–15.5)
WBC Morphology: INCREASED
WBC: 18.3 10*3/uL — ABNORMAL HIGH (ref 4.0–10.5)

## 2018-01-07 LAB — TYPE AND SCREEN
ABO/RH(D): A POS
ANTIBODY SCREEN: NEGATIVE

## 2018-01-07 LAB — I-STAT CG4 LACTIC ACID, ED
LACTIC ACID, VENOUS: 4.12 mmol/L — AB (ref 0.5–1.9)
LACTIC ACID, VENOUS: 3.08 mmol/L — AB (ref 0.5–1.9)

## 2018-01-07 LAB — PROTIME-INR
INR: 1.31
Prothrombin Time: 16.1 seconds — ABNORMAL HIGH (ref 11.4–15.2)

## 2018-01-07 LAB — MRSA PCR SCREENING: MRSA by PCR: NEGATIVE

## 2018-01-07 LAB — CORTISOL: Cortisol, Plasma: 28.2 ug/dL

## 2018-01-07 LAB — LIPASE, BLOOD: LIPASE: 34 U/L (ref 11–51)

## 2018-01-07 LAB — APTT: APTT: 35 s (ref 24–36)

## 2018-01-07 LAB — TROPONIN I: Troponin I: 0.06 ng/mL (ref <0.03)

## 2018-01-07 LAB — LACTIC ACID, PLASMA: Lactic Acid, Venous: 2.2 mmol/L (ref 0.5–1.9)

## 2018-01-07 LAB — PROCALCITONIN: PROCALCITONIN: 4.5 ng/mL

## 2018-01-07 MED ORDER — FAMOTIDINE IN NACL 20-0.9 MG/50ML-% IV SOLN
20.0000 mg | Freq: Two times a day (BID) | INTRAVENOUS | Status: DC
  Administered 2018-01-07 – 2018-01-09 (×4): 20 mg via INTRAVENOUS
  Filled 2018-01-07 (×5): qty 50

## 2018-01-07 MED ORDER — SODIUM CHLORIDE 0.9 % IV SOLN: 500.0000 mL | INTRAVENOUS | Status: DC | PRN

## 2018-01-07 MED ORDER — HEPARIN SODIUM (PORCINE) 5000 UNIT/ML IJ SOLN
5000.0000 [IU] | Freq: Three times a day (TID) | INTRAMUSCULAR | Status: DC
  Administered 2018-01-07: 5000 [IU] via SUBCUTANEOUS
  Filled 2018-01-07: qty 1

## 2018-01-07 MED ORDER — PIPERACILLIN-TAZOBACTAM 3.375 G IVPB
3.3750 g | Freq: Three times a day (TID) | INTRAVENOUS | Status: DC
  Administered 2018-01-07 – 2018-01-09 (×6): 3.375 g via INTRAVENOUS
  Filled 2018-01-07 (×6): qty 50

## 2018-01-07 MED ORDER — ACETAMINOPHEN 325 MG PO TABS
650.0000 mg | ORAL_TABLET | Freq: Once | ORAL | Status: AC
  Administered 2018-01-07: 650 mg via ORAL
  Filled 2018-01-07: qty 2

## 2018-01-07 MED ORDER — FENTANYL CITRATE (PF) 100 MCG/2ML IJ SOLN
100.0000 ug | Freq: Once | INTRAMUSCULAR | Status: AC
  Administered 2018-01-07: 100 ug via INTRAVENOUS
  Filled 2018-01-07: qty 2

## 2018-01-07 MED ORDER — IOHEXOL 300 MG/ML  SOLN
100.0000 mL | Freq: Once | INTRAMUSCULAR | Status: AC | PRN
  Administered 2018-01-07: 100 mL via INTRAVENOUS

## 2018-01-07 MED ORDER — FENTANYL CITRATE (PF) 100 MCG/2ML IJ SOLN
25.0000 ug | INTRAMUSCULAR | Status: DC | PRN
  Administered 2018-01-07 (×3): 25 ug via INTRAVENOUS
  Filled 2018-01-07 (×3): qty 2

## 2018-01-07 MED ORDER — LACTATED RINGERS IV SOLN
INTRAVENOUS | Status: DC
  Administered 2018-01-07 – 2018-01-09 (×5): via INTRAVENOUS

## 2018-01-07 MED ORDER — INDOMETHACIN 50 MG RE SUPP
100.0000 mg | Freq: Once | RECTAL | Status: AC
  Administered 2018-01-07: 100 mg via RECTAL
  Filled 2018-01-07: qty 2

## 2018-01-07 MED ORDER — ONDANSETRON HCL 4 MG/2ML IJ SOLN
4.0000 mg | Freq: Once | INTRAMUSCULAR | Status: AC
  Administered 2018-01-07: 4 mg via INTRAVENOUS
  Filled 2018-01-07: qty 2

## 2018-01-07 MED ORDER — SODIUM CHLORIDE 0.9 % IV SOLN
250.0000 mL | INTRAVENOUS | Status: DC | PRN
  Administered 2018-01-08: 250 mL via INTRAVENOUS

## 2018-01-07 MED ORDER — SODIUM CHLORIDE 0.9 % IV BOLUS
1000.0000 mL | Freq: Once | INTRAVENOUS | Status: AC
  Administered 2018-01-07: 1000 mL via INTRAVENOUS

## 2018-01-07 MED ORDER — POTASSIUM CHLORIDE 10 MEQ/100ML IV SOLN
10.0000 meq | INTRAVENOUS | Status: AC
  Administered 2018-01-07 (×6): 10 meq via INTRAVENOUS
  Filled 2018-01-07 (×6): qty 100

## 2018-01-07 MED ORDER — FENTANYL CITRATE (PF) 100 MCG/2ML IJ SOLN
25.0000 ug | INTRAMUSCULAR | Status: DC | PRN
  Administered 2018-01-07 – 2018-01-09 (×3): 50 ug via INTRAVENOUS
  Filled 2018-01-07 (×3): qty 2

## 2018-01-07 MED ORDER — SODIUM CHLORIDE 0.9 % IV SOLN
INTRAVENOUS | Status: DC
  Administered 2018-01-07: 23:00:00 via INTRAVENOUS

## 2018-01-07 MED ORDER — SODIUM CHLORIDE 0.9 % IV SOLN: INTRAVENOUS | Status: DC

## 2018-01-07 MED ORDER — PIPERACILLIN-TAZOBACTAM 3.375 G IVPB 30 MIN
3.3750 g | Freq: Once | INTRAVENOUS | Status: AC
  Administered 2018-01-07: 3.375 g via INTRAVENOUS
  Filled 2018-01-07: qty 50

## 2018-01-07 MED ORDER — FENTANYL CITRATE (PF) 100 MCG/2ML IJ SOLN
50.0000 ug | Freq: Once | INTRAMUSCULAR | Status: AC
  Administered 2018-01-07: 50 ug via INTRAVENOUS
  Filled 2018-01-07: qty 2

## 2018-01-07 MED ORDER — NOREPINEPHRINE 4 MG/250ML-% IV SOLN
5.0000 ug/min | INTRAVENOUS | Status: DC
  Administered 2018-01-07: 5 ug/min via INTRAVENOUS
  Filled 2018-01-07: qty 250

## 2018-01-07 NOTE — Progress Notes (Signed)
PHARMACY - PHYSICIAN COMMUNICATION CRITICAL VALUE ALERT - BLOOD CULTURE IDENTIFICATION (BCID)  Alexander [name] is an 67 y.o. male who presented to [facility] on 01/07/2018 with septic shock secondary to presumed ascending cholangitis  Assessment:  4 out of 4 blood cultures positive for GNRs identified as E. Coli (KPC negative)  Name of physician (or Provider) Contacted: Dr. Jimmy Footman  Current antibiotics: Zosyn 3.375 gm IV q8hr  Changes to prescribed antibiotics recommended:  Patient is on recommended antibiotics - No changes needed  Results for orders placed or performed during the hospital encounter of 01/07/18  Blood Culture ID Panel (Reflexed) (Collected: 01/07/2018  9:26 AM)  Result Value Ref Range   Enterococcus species NOT DETECTED NOT DETECTED   Listeria monocytogenes NOT DETECTED NOT DETECTED   Staphylococcus species NOT DETECTED NOT DETECTED   Staphylococcus aureus NOT DETECTED NOT DETECTED   Streptococcus species NOT DETECTED NOT DETECTED   Streptococcus agalactiae NOT DETECTED NOT DETECTED   Streptococcus pneumoniae NOT DETECTED NOT DETECTED   Streptococcus pyogenes NOT DETECTED NOT DETECTED   Acinetobacter baumannii NOT DETECTED NOT DETECTED   Enterobacteriaceae species DETECTED (A) NOT DETECTED   Enterobacter cloacae complex NOT DETECTED NOT DETECTED   Escherichia coli DETECTED (A) NOT DETECTED   Klebsiella oxytoca NOT DETECTED NOT DETECTED   Klebsiella pneumoniae NOT DETECTED NOT DETECTED   Proteus species NOT DETECTED NOT DETECTED   Serratia marcescens NOT DETECTED NOT DETECTED   Carbapenem resistance NOT DETECTED NOT DETECTED   Haemophilus influenzae NOT DETECTED NOT DETECTED   Neisseria meningitidis NOT DETECTED NOT DETECTED   Pseudomonas aeruginosa NOT DETECTED NOT DETECTED   Candida albicans NOT DETECTED NOT DETECTED   Candida glabrata NOT DETECTED NOT DETECTED   Candida krusei NOT DETECTED NOT DETECTED   Candida parapsilosis NOT DETECTED NOT DETECTED   Candida  tropicalis NOT DETECTED NOT DETECTED    Alanda Slim, PharmD, FCCM Clinical Pharmacist Please see AMION for all Pharmacists' Contact Phone Numbers 01/07/2018, 9:39 PM

## 2018-01-07 NOTE — Consult Note (Addendum)
Consultation  Referring Provider: Dr. Lynetta Mare      Primary Care Physician:  [name], MD Primary Gastroenterologist: Dr. Hilarie Fredrickson        Reason for Consultation:   Abdominal pain, concern for acute cholangitis         HPI:   Alexander [name] is a 67 y.o. 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      Today, explains that he has had some "off and on" episodes of "burning/gnawing" in his epigastrum which he has related to spicy/hot foods, etc over the past few months. Then, 01/05/18 he woke up and had some yogurt and started with a more severe one of these episodes. He became nauseous and started vomiting as well as running a fever. This continued into yesterday when his wife finally brought him to the ER. Associated symptoms included some shortness of breath primarily due to pain preventing deep breathing.   History positive for cholecystectomy in 2017. Also had percutaneous drain at that time. Has not eaten since yesterday morning, taking sips of water here and there but these cause worsening of the "burning sensation", in his epigastrum seconds after swallowing.   Denies weight loss.  ER course: Temp 103.4, white blood cell count 18.3, initial lactic acid was 4.12, tachycardic and hypotensive with systolic blood pressure in the low 80s, IV Zosyn was initiated, CT showed moderate common bile duct dilation measuring up to 17 mm and air within the distal common bile duct, LFTs elevated  Previous GI history: 10/19/2013 colonoscopy: Dr. Hilarie Fredrickson: 2 sessile polyps measuring 5-7 mm in size of the hepatic flexure, sessile polyp measuring 3-4 mm in size of the transverse colon  Past Medical History:  Diagnosis  Date  . Acute cholecystitis 03/2016   with sepsis  . Arthritis   . CAD S/P percutaneous coronary angioplasty 1995, 2000, 2005   a. 1995 s/p BMS;  b. 2000 s/p BMS;  c. 2005 s/p stent - All stents in [facility] (RCA, LAD & OM - unknown on which date);  d. 04/2015 NSTEMI/Cath: LM nl, ost/p LAD 20%, patent mLAD stent, RI small, OM2 patent stent, pRCA 20%, patent stent, EF 45-50%-->Med Rx.  . Chronic pain syndrome    a. Followed @ [facility];  b. Uses 12-14 excedrins per day.  . Essential hypertension   . History of depression   . History of post poliomyelitis muscular atrophy   . HLD (hyperlipidemia)   . Ischemic cardiomyopathy    a. 04/2015 Echo: EF 40-45%, mod antsept, ant, antlat, apical HK, mild AI/MR, PASP 109mmHg.  . Migraines   . Neuropathy   . 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 NSTEMI (non-ST elevated myocardial infarction) ([place]) 04/2015   - Demand Ischemia in setting of Sepsis (also with prior MI history  . OSA (obstructive sleep apnea)    does not want to use CPAP, using mouth guard  . PAF (paroxysmal atrial fibrillation) ([place])    a. 04/2015 in setting of cholecystits and sepsis -->Amio;  b. CHA2DS2VASc = 4--> Eliquis.  . Pneumonia 04/2015  . Post-polio syndrome    a. ambulates with braces.  . Shoulder pain, right     Past Surgical History:  Procedure Laterality Date  . CARDIAC CATHETERIZATION N/A 04/30/2015   Procedure: Left Heart Cath and Coronary Angiography;  Surgeon: Troy Sine, MD;  Location: [facility];  Service: Cardiovascular; LM nl, ost/p LAD 20%, patent mLAD stent, RI small, OM2 patent stent, pRCA 20%, patent stent, EF 45-50%-->Med Rx   . cervical spine stimulator  08/2012  . CHOLECYSTECTOMY  06/10/2015   Procedure: LAPAROSCOPIC CHOLECYSTECTOMY;  Surgeon: Mickeal Skinner, MD;  Location: [facility];  Service: General;;  . COLONOSCOPY  09/2013   TA x2, rpt 5 yrs ([place])  . CORONARY ANGIOPLASTY WITH STENT PLACEMENT  1995, 2000, 2005   stents  in RCA, LAD & Cx-OM. -Stents were patent by cardiac catheterization in November 2016  . lumbar spine stimulator    . TRANSTHORACIC ECHOCARDIOGRAM  04/2015   EF 40-45%, mod antsept, ant, antlat, apical HK, mild AI/MR, PASP 78mmHg.  [facility] Kitchen UVULOPALATOPHARYNGOPLASTY, TONSILLECTOMY AND SEPTOPLASTY  2002    Family History  Problem Relation Age of Onset  . Diabetes Mother   . Cancer Sister 16       male cancer  . Cancer Brother 60       breast  . Coronary artery disease Neg Hx   . Stroke Neg Hx   . Colon cancer Neg Hx   . Pancreatic cancer Neg Hx   . Stomach cancer Neg Hx     Social History   Tobacco Use  . Smoking status: Never Smoker  . Smokeless tobacco: Never Used  Substance Use Topics  . Alcohol use: No    Alcohol/week: 0.0 standard drinks  . Drug use: No    Prior to Admission medications   Medication Sig Start Date End Date Taking? Authorizing Provider  aspirin 81 MG chewable tablet Chew 1 tablet (81 mg total) by mouth daily. 05/02/15  Yes Thurnell Lose, MD  aspirin-acetaminophen-caffeine (EXCEDRIN MIGRAINE) [phone] MG tablet Take 1 tablet by mouth every 6 (six) hours as needed for headache. Takes 6 tablets daily 11/11/17  Yes [name], MD  atorvastatin (LIPITOR) 80 MG tablet Take 0.5 tablets (40 mg total) daily at 6 PM by mouth. Patient taking differently: Take 40 mg by mouth every other day.  04/08/17  Yes Leonie Man, MD  Cholecalciferol ([employer] VITAMIN D) 2000 UNITS CAPS Take 1 capsule (2,000 Units total) by mouth daily. 03/29/13  Yes [name], MD  gabapentin (NEURONTIN) 600 MG tablet TAKE 2 TABLETS 3 TIMES A DAY Patient taking differently: Take 1,200 mg by mouth 3 (three) times daily.  11/11/17  Yes [name], MD  ibuprofen (ADVIL) 200 MG tablet Take 3 tablets (600 mg total) by mouth 2 (two) times daily as needed. 11/11/17  Yes [name], MD  lisinopril (PRINIVIL,ZESTRIL) 2.5 MG tablet Take 1 tablet (2.5 mg total) by mouth daily.  11/05/17  Yes [name], MD  Magnesium 250 MG TABS Take 1 tablet (250 mg total) by mouth at bedtime. 11/11/17  Yes [name], MD  methocarbamol (ROBAXIN) 500 MG tablet Take 500 mg by mouth as needed for muscle spasms.   Yes [provider]  metoprolol tartrate (LOPRESSOR) 25 MG tablet Take 12.5 mg by mouth 2 (two) times daily.   Yes [provider]  Multiple Vitamins-Minerals (MULTIVITAMIN ADULTS 50+ PO) Take 1 tablet by mouth daily.   Yes [provider]  Omega-3 Fatty Acids (FISH OIL) 1000 MG CAPS Take 1 capsule by mouth daily.   Yes [provider]  omeprazole (PRILOSEC) 40 MG capsule  Take 1 capsule (40 mg total) by mouth daily. 11/05/17  Yes [name], MD  sildenafil (VIAGRA) 100 MG tablet TAKE ONE-HALF TO ONE TABLET BY MOUTH DAILY AS NEEDED FOR ERECTILE DYSFUNCTION 11/03/17  Yes [name], MD  vitamin C (ASCORBIC ACID) 500 MG tablet Take 1,000 mg by mouth daily.   Yes [provider]  omeprazole (PRILOSEC) 40 MG capsule TAKE 1 CAPSULE BY MOUTH EVERY DAY Patient not taking: Reported on 01/07/2018 12/01/17   [name], MD    Current Facility-Administered Medications  Medication Dose Route Frequency Provider Last Rate Last Dose  . 0.9 %  sodium chloride infusion  250 mL Intravenous PRN Erick Colace, NP      . 0.9 %  sodium chloride infusion  500 mL Intravenous Q30 min PRN Erick Colace, NP      . 0.9 %  sodium chloride infusion   Intravenous Continuous Erick Colace, NP      . famotidine (PEPCID) IVPB 20 mg premix  20 mg Intravenous Q12H Erick Colace, NP      . fentaNYL (SUBLIMAZE) injection 25 mcg  25 mcg Intravenous Q2H PRN Erick Colace, NP      . heparin injection 5,000 Units  5,000 Units Subcutaneous Q8H Erick Colace, NP      . lactated ringers infusion   Intravenous Continuous Erick Colace, NP      . norepinephrine (LEVOPHED) 4mg  in D5W 284mL premix infusion  5-50 mcg/min Intravenous  Titrated Erick Colace, NP 37.5 mL/hr at 01/07/18 1331 10 mcg/min at 01/07/18 1331  . piperacillin-tazobactam (ZOSYN) IVPB 3.375 g  3.375 g Intravenous Q8H Erick Colace, NP        Allergies as of 01/07/2018  . (No Known Allergies)     Review of Systems:    Constitutional: No weight loss Skin: No rash or itching Cardiovascular: No chest pain Respiratory: +SOB Gastrointestinal: See HPI and otherwise negative Genitourinary: No dysuria Neurological: No headache Musculoskeletal: No new muscle or joint pain Hematologic: No bleeding  Psychiatric: No history of depression or anxiety    Physical Exam:  Vital signs in last 24 hours: Temp:  [97.4 F (36.3 C)-103.4 F (39.7 C)] 103.4 F (39.7 C) (08/09 0917) Pulse Rate:  [71-112] 106 (08/09 1328) Resp:  [12-34] 22 (08/09 1328) BP: (82-143)/(44-94) 85/58 (08/09 1328) SpO2:  [91 %-100 %] 99 % (08/09 1328) Weight:  [93.9 kg] 93.9 kg (08/09 0654)   General:   Pleasant Caucasian male appears to be in NAD, Well developed, Well nourished, alert and cooperative Head:  Normocephalic and atraumatic. Eyes:   PEERL, EOMI. No icterus. Conjunctiva pink. Ears:  Normal auditory acuity. Neck:  Supple Throat: Oral cavity and pharynx without inflammation, swelling or lesion.  Lungs: Respirations even and unlabored. Lungs clear to auscultation bilaterally.   No wheezes, crackles, or rhonchi.  Heart: Normal S1, S2. No MRG. Regular rate and rhythm. No peripheral edema, cyanosis or pallor.  Abdomen:  Soft, nondistended, Marked ttp across epigastrum, with some involuntary guarding, Normal bowel sounds. No appreciable masses or hepatomegaly. Rectal:  Not performed.  Msk:  Symmetrical without gross deformities. Peripheral pulses intact.  Extremities:  Without edema, no deformity or joint abnormality.  Neurologic:  Alert and  oriented x4;  grossly normal neurologically.  Skin:   Dry and intact without significant lesions or rashes. Psychiatric:  Demonstrates good judgement and reason without abnormal affect or behaviors.   LAB RESULTS: Recent Labs    01/07/18  0754  WBC 18.3*  HGB 15.0  HCT 43.7  PLT 206   BMET Recent Labs    01/07/18 0754  NA 136  K 3.3*  CL 100  CO2 18*  GLUCOSE 116*  BUN 14  CREATININE 0.76  CALCIUM 9.7   LFT Recent Labs    01/07/18 0754  PROT 7.5  ALBUMIN 4.0  AST 146*  ALT 267*  ALKPHOS 138*  BILITOT 6.1*   STUDIES: Ct Abdomen Pelvis W Contrast  Result Date: 01/07/2018 CLINICAL DATA:  Epigastric pain for the past 2 days with nausea and vomiting. EXAM: CT ABDOMEN AND PELVIS WITH CONTRAST TECHNIQUE: Multidetector CT imaging of the abdomen and pelvis was performed using the standard protocol following bolus administration of intravenous contrast. CONTRAST:  141mL OMNIPAQUE IOHEXOL 300 MG/ML  SOLN COMPARISON:  CT abdomen pelvis dated May 15, 2015. FINDINGS: Lower chest: Slightly progressed bibasilar chronic interstitial lung disease. Hepatobiliary: No focal liver abnormality. Status post cholecystectomy. Mild intra and moderate extrahepatic biliary dilatation. Air within the distal common bile duct may be related to prior sphincterotomy. Pancreas: Unremarkable. No pancreatic ductal dilatation or surrounding inflammatory changes. Spleen: Normal in size without focal abnormality. Adrenals/Urinary Tract: Stable 1.9 cm left adrenal adenoma. The right adrenal gland is unremarkable. No renal or ureteral calculi. Subcentimeter low-density lesion in the left kidney remains too small to characterize, but is unchanged. No hydronephrosis. The bladder is unremarkable. Stomach/Bowel: Stomach is within normal limits. Appendix appears normal. No evidence of bowel wall thickening, distention, or inflammatory changes. Mild left-sided colonic diverticulosis. Vascular/Lymphatic: Aortic atherosclerosis. No enlarged abdominal or pelvic lymph nodes. Reproductive: Mild prostatomegaly. Other: No abdominal wall hernia or  abnormality. No abdominopelvic ascites. No pneumoperitoneum. Musculoskeletal: No acute or significant osseous findings. Unchanged grade 1 anterolisthesis at L5-S1 due to bilateral pars defects. IMPRESSION: 1. Moderate common bile duct dilatation, measuring up to 17 mm. While this may be related to post cholecystectomy state, correlation with LFTs is recommended. If abnormal, recommend ERCP or MRCP for further evaluation. 2. Air within the distal common bile duct may be related to prior sphincterotomy. 3. Slightly progressed chronic interstitial lung disease. 4.  Aortic atherosclerosis (ICD10-I70.0). Electronically Signed   By: Titus Dubin M.D.   On: 01/07/2018 11:40   Dg Abdomen Acute W/chest  Result Date: 01/07/2018 CLINICAL DATA:  epigastric abdominal pain that started yesterday. Pt has had nausea and vomitng since 0630 yesterday and has been weak ever since. Pt is currently having sob EXAM: DG ABDOMEN ACUTE W/ 1V CHEST COMPARISON:  08/01/2015 and 05/15/2015 FINDINGS: Cervical and thoracic stimulator leads noted. Low lung volumes are present, causing crowding of the pulmonary vasculature. Mildly elevated right hemidiaphragm. Chronic mild interstitial accentuation in both lungs. Heart size within normal limits given the low lung volumes. Thoracolumbar spondylosis. Unremarkable bowel gas pattern. No free intraperitoneal gas. No significant abnormal calcifications are observed in the abdomen. No significant abnormal air-fluid levels in bowel. IMPRESSION: 1. A specific cause for the patient's epigastric abdominal pain is not identified. Bowel gas pattern normal. 2. Low lung volumes are present, causing crowding of the pulmonary vasculature. 3. Chronic interstitial accentuation in the lungs. Electronically Signed   By: Van Clines M.D.   On: 01/07/2018 08:04     Impression / Plan:   Impression: 1.  Acute cholangitis: seen on CT, h/o cholecystectomy in 2017, concern for choledocholithiasis with  dilated CBD 2.  Septic shock: related to above 3.  Systolic cardiomyopathy with EF 40-45% last echo 2016 4.  A. Fib  Plan: 1.  Agree with Zosyn 2.  MRCP ordered for today 3. Will plan for ERCP tomorrow with Dr. Lyndel Safe, pending results of MRCP today.  Did discuss risks, benefits, limitations and alternatives and the patient agrees to proceed. 4. Please await any further recommendations from Dr. Lyndel Safe later today.  Thank you for your kind consultation, we will continue to follow.  Lavone Nian Lemmon  01/07/2018, 1:58 PM   Attending physician's note   I have taken an interval history, reviewed the chart and examined the patient. I agree with the Advanced Practitioner's note, impression and recommendations.   67 year old with A. fib, CHF, sleep apnea, history of cholecystostomy followed by cholecystectomy in 2017 with abdominal pain, jaundice fever with chills with septic shock. CT showing dilated common bile duct.  Highly suggestive of ascending cholangitis with choledocholithiasis. MRCP could not be performed due to lumbar spine stimulator.  Plan: Broad-spectrum antibiotics, ERCP in AM (could not be done today due to scheduling constraints).  I have discussed risks and benefits in detail with the patient and patient's wife including pancreatitis, bleeding, perforation, anesthesia complications and rarely death.  Alternatives were given.  They wish to proceed.  Carmell Austria, MD

## 2018-01-07 NOTE — ED Triage Notes (Addendum)
Per EMS, pt coming from home with complaints of epigastric abdominal pain that started yesterday. Pt has had nausea and vomitng since 0630 yesterday and has been weak ever since. Pt is currently having dysnpea with rest and complains of 10/10 pain. Pt's family stated that he has been slurring his words and eyes have been rolling to the back of his head. Pt received 4mg  zofran via ems.

## 2018-01-07 NOTE — Progress Notes (Signed)
Scheduled MRCP cancelled per MRI due to implanted nerve stimulator device. Notified MD.

## 2018-01-07 NOTE — ED Notes (Signed)
Patient transported to X-ray 

## 2018-01-07 NOTE — Progress Notes (Signed)
CRITICAL VALUE ALERT  Critical Value:  Troponin 0.06  Date & Time Notied:  01/07/18 1610  Provider Notified: Agarwala  Orders Received/Actions taken: none given

## 2018-01-07 NOTE — Progress Notes (Signed)
CRITICAL VALUE ALERT  Critical Value:  k 2.5  Date & Time Notied:  01/07/18 1513  Provider Notified: Marni Griffon  Orders Received/Actions taken: see orders

## 2018-01-07 NOTE — ED Provider Notes (Signed)
[facility] EMERGENCY DEPARTMENT Provider Note   CSN: [id number] Arrival date & time: 01/07/18  6213     History   Chief Complaint Chief Complaint  Patient presents with  . Abdominal Pain    HPI Alexander [name] is a 67 y.o. male.  HPI Patient presents with upper abdominal pain and vomiting.  Began yesterday morning at about 630.  Continued to vomit.  Had bowel movement yesterday.  Pain is in the upper abdomen and somewhat severe.  It is dull and somewhat crampy.  No fevers.  Reportedly has looked pale at times however.  States he has been unable to keep anything down, including his medicines.  Previous cholecystectomy.  Had previously been on Excedrin but is no longer on it.  Previous history of polio.  No blood in emesis or stool.  Past Medical History:  Diagnosis Date  . Acute cholecystitis 03/2016   with sepsis  . Arthritis   . CAD S/P percutaneous coronary angioplasty 1995, 2000, 2005   a. 1995 s/p BMS;  b. 2000 s/p BMS;  c. 2005 s/p stent - All stents in [facility] (RCA, LAD & OM - unknown on which date);  d. 04/2015 NSTEMI/Cath: LM nl, ost/p LAD 20%, patent mLAD stent, RI small, OM2 patent stent, pRCA 20%, patent stent, EF 45-50%-->Med Rx.  . Chronic pain syndrome    a. Followed @ [facility];  b. Uses 12-14 excedrins per day.  . Essential hypertension   . History of depression   . History of post poliomyelitis muscular atrophy   . HLD (hyperlipidemia)   . Ischemic cardiomyopathy    a. 04/2015 Echo: EF 40-45%, mod antsept, ant, antlat, apical HK, mild AI/MR, PASP 106mmHg.  . Migraines   . Neuropathy   . 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 NSTEMI (non-ST elevated myocardial infarction) ([place]) 04/2015   - [place] Ischemia in setting of Sepsis (also with prior MI history  . OSA (obstructive sleep apnea)    does not want to use CPAP, using mouth guard  . PAF (paroxysmal atrial fibrillation) ([place])    a. 04/2015 in setting of cholecystits and  sepsis -->[facility];  b. CHA2DS2VASc = 4--> Eliquis.  . Pneumonia 04/2015  . Post-polio syndrome    a. ambulates with braces.  . Shoulder pain, right     Patient Active Problem List   Diagnosis Date Noted  . Septic shock ([place]) 01/07/2018  . GERD (gastroesophageal reflux disease) 11/13/2017  . CAD S/P percutaneous coronary angioplasty   . PAF (paroxysmal atrial fibrillation) ([place]); CHA2DS2VASc = 4--> Eliquis.   . Ischemic cardiomyopathy   . NSTEMI (non-ST elevated myocardial infarction) ([place]) 04/27/2015  . Essential hypertension   . Advanced care planning/counseling discussion 07/12/2014  . Obesity, Class I, BMI 30-34.9 07/12/2014  . Scapular dysfunction 06/12/2014  . Medicare annual wellness visit, subsequent 06/29/2013  . Vitamin D deficiency 03/29/2013  . Fatigue 02/27/2013  . Prediabetes 05/09/2012  . Male sexual dysfunction 05/03/2012  . Post-polio syndrome   . Chronic pain syndrome   . OSA (obstructive sleep apnea)   . Migraine   . Hyperlipidemia with target LDL less than 70     Past Surgical History:  Procedure Laterality Date  . CARDIAC CATHETERIZATION N/A 04/30/2015   Procedure: Left Heart Cath and Coronary Angiography;  Surgeon: Troy Sine, MD;  Location: [facility];  Service: Cardiovascular; LM nl, ost/p LAD 20%, patent mLAD stent, RI small, OM2 patent stent,  pRCA 20%, patent stent, EF 45-50%-->Med Rx   . cervical spine stimulator  08/2012  . CHOLECYSTECTOMY  06/10/2015   Procedure: LAPAROSCOPIC CHOLECYSTECTOMY;  Surgeon: Mickeal Skinner, MD;  Location: [facility];  Service: General;;  . COLONOSCOPY  09/2013   TA x2, rpt 5 yrs ([place])  . CORONARY ANGIOPLASTY WITH STENT PLACEMENT  1995, 2000, 2005   stents in RCA, LAD & Cx-OM. -Stents were patent by cardiac catheterization in November 2016  . lumbar spine stimulator    . TRANSTHORACIC ECHOCARDIOGRAM  04/2015   EF 40-45%, mod antsept, ant, antlat, apical HK, mild AI/MR, PASP 25mmHg.  Marland Kitchen  UVULOPALATOPHARYNGOPLASTY, TONSILLECTOMY AND SEPTOPLASTY  2002        Home Medications    Prior to Admission medications   Medication Sig Start Date End Date Taking? Authorizing Provider  aspirin 81 MG chewable tablet Chew 1 tablet (81 mg total) by mouth daily. 05/02/15  Yes Thurnell Lose, MD  aspirin-acetaminophen-caffeine (EXCEDRIN MIGRAINE) [phone] MG tablet Take 1 tablet by mouth every 6 (six) hours as needed for headache. Takes 6 tablets daily 11/11/17  Yes [name], MD  atorvastatin (LIPITOR) 80 MG tablet Take 0.5 tablets (40 mg total) daily at 6 PM by mouth. Patient taking differently: Take 40 mg by mouth every other day.  04/08/17  Yes Leonie Man, MD  Cholecalciferol ([employer] VITAMIN D) 2000 UNITS CAPS Take 1 capsule (2,000 Units total) by mouth daily. 03/29/13  Yes [name], MD  gabapentin (NEURONTIN) 600 MG tablet TAKE 2 TABLETS 3 TIMES A DAY Patient taking differently: Take 1,200 mg by mouth 3 (three) times daily.  11/11/17  Yes [name], MD  ibuprofen (ADVIL) 200 MG tablet Take 3 tablets (600 mg total) by mouth 2 (two) times daily as needed. 11/11/17  Yes [name], MD  lisinopril (PRINIVIL,ZESTRIL) 2.5 MG tablet Take 1 tablet (2.5 mg total) by mouth daily. 11/05/17  Yes [name], MD  Magnesium 250 MG TABS Take 1 tablet (250 mg total) by mouth at bedtime. 11/11/17  Yes [name], MD  methocarbamol (ROBAXIN) 500 MG tablet Take 500 mg by mouth as needed for muscle spasms.   Yes [provider]  metoprolol tartrate (LOPRESSOR) 25 MG tablet Take 12.5 mg by mouth 2 (two) times daily.   Yes [provider]  Multiple Vitamins-Minerals (MULTIVITAMIN ADULTS 50+ PO) Take 1 tablet by mouth daily.   Yes [provider]  Omega-3 Fatty Acids (FISH OIL) 1000 MG CAPS Take 1 capsule by mouth daily.   Yes [provider]  omeprazole (PRILOSEC) 40 MG capsule Take 1 capsule (40 mg total) by mouth daily. 11/05/17   Yes [name], MD  sildenafil (VIAGRA) 100 MG tablet TAKE ONE-HALF TO ONE TABLET BY MOUTH DAILY AS NEEDED FOR ERECTILE DYSFUNCTION 11/03/17  Yes [name], MD  vitamin C (ASCORBIC ACID) 500 MG tablet Take 1,000 mg by mouth daily.   Yes [provider]  omeprazole (PRILOSEC) 40 MG capsule TAKE 1 CAPSULE BY MOUTH EVERY DAY Patient not taking: Reported on 01/07/2018 12/01/17   [name], MD    Family History Family History  Problem Relation Age of Onset  . Diabetes Mother   . Cancer Sister 29       male cancer  . Cancer Brother 60       breast  . Coronary artery disease Neg Hx   . Stroke Neg Hx   . Colon cancer Neg Hx   . Pancreatic cancer Neg Hx   .  Stomach cancer Neg Hx     Social History Social History   Tobacco Use  . Smoking status: Never Smoker  . Smokeless tobacco: Never Used  Substance Use Topics  . Alcohol use: No    Alcohol/week: 0.0 standard drinks  . Drug use: No     Allergies   Patient has no known allergies.   Review of Systems Review of Systems  Constitutional: Positive for appetite change.  HENT: Negative for congestion.   Respiratory: Negative for shortness of breath.   Cardiovascular: Negative for chest pain.  Gastrointestinal: Positive for abdominal pain, nausea and vomiting.  Endocrine: Negative for polyuria.  Genitourinary: Negative for flank pain.  Musculoskeletal: Negative for back pain.  Neurological: Negative for seizures.  Hematological: Negative for adenopathy.  Psychiatric/Behavioral: Negative for confusion.     Physical Exam Updated Vital Signs BP 104/77   Pulse (!) 104   Temp 98.7 F (37.1 C) (Oral)   Resp (!) 28   Ht 5\' 10"  (1.778 m)   Wt 96.7 kg   SpO2 94%   BMI 30.59 kg/m   Physical Exam  Constitutional: He appears well-developed.  Patient appears uncomfortable  HENT:  Head: Normocephalic.  Cardiovascular: Regular rhythm.  Pulmonary/Chest: Breath sounds normal.  Abdominal:  Upper  abdominal tenderness more severe, but does have somewhat diffuse tenderness.  No hernias palpated.  Neurological: He is alert.  Skin: Skin is warm. Capillary refill takes less than 2 seconds.  Psychiatric: He has a normal mood and affect.     ED Treatments / Results  Labs (all labs ordered are listed, but only abnormal results are displayed) Labs Reviewed  COMPREHENSIVE METABOLIC PANEL - Abnormal; Notable for the following components:      Result Value   Potassium 3.3 (*)    CO2 18 (*)    Glucose, Bld 116 (*)    AST 146 (*)    ALT 267 (*)    Alkaline Phosphatase 138 (*)    Total Bilirubin 6.1 (*)    Anion gap 18 (*)    All other components within normal limits  CBC WITH DIFFERENTIAL/PLATELET - Abnormal; Notable for the following components:   WBC 18.3 (*)    Neutro Abs 17.2 (*)    Lymphs Abs 0.5 (*)    All other components within normal limits  URINALYSIS, ROUTINE W REFLEX MICROSCOPIC - Abnormal; Notable for the following components:   Color, Urine AMBER (*)    Hgb urine dipstick SMALL (*)    Bilirubin Urine SMALL (*)    Ketones, ur 80 (*)    Protein, ur 30 (*)    All other components within normal limits  LACTIC ACID, PLASMA - Abnormal; Notable for the following components:   Lactic Acid, Venous 2.2 (*)    All other components within normal limits  TROPONIN I - Abnormal; Notable for the following components:   Troponin I 0.06 (*)    All other components within normal limits  PROTIME-INR - Abnormal; Notable for the following components:   Prothrombin Time 16.1 (*)    All other components within normal limits  BASIC METABOLIC PANEL - Abnormal; Notable for the following components:   Potassium 2.5 (*)    CO2 17 (*)    Glucose, Bld 100 (*)    Calcium 8.0 (*)    All other components within normal limits  GLUCOSE, CAPILLARY - Abnormal; Notable for the following components:   Glucose-Capillary 101 (*)    All other components within normal limits  I-STAT CG4 LACTIC ACID,  ED - Abnormal; Notable for the following components:   Lactic Acid, Venous 4.12 (*)    All other components within normal limits  I-STAT CG4 LACTIC ACID, ED - Abnormal; Notable for the following components:   Lactic Acid, Venous 3.08 (*)    All other components within normal limits  MRSA PCR SCREENING  CULTURE, BLOOD (ROUTINE X 2)  CULTURE, BLOOD (ROUTINE X 2)  LIPASE, BLOOD  CORTISOL  PROCALCITONIN  APTT  LACTIC ACID, PLASMA  HIV ANTIBODY (ROUTINE TESTING)  TYPE AND SCREEN    EKG EKG Interpretation  Date/Time:  Friday January 07 2018 06:55:48 EDT Ventricular Rate:  79 PR Interval:    QRS Duration: 117 QT Interval:  437 QTC Calculation: 501 R Axis:   7 Text Interpretation:  Sinus rhythm Short PR interval Left ventricular hypertrophy Prolonged QT interval Confirmed by Davonna Belling [phone]) on 01/07/2018 8:06:08 AM   Radiology Ct Abdomen Pelvis W Contrast  Result Date: 01/07/2018 CLINICAL DATA:  Epigastric pain for the past 2 days with nausea and vomiting. EXAM: CT ABDOMEN AND PELVIS WITH CONTRAST TECHNIQUE: Multidetector CT imaging of the abdomen and pelvis was performed using the standard protocol following bolus administration of intravenous contrast. CONTRAST:  128mL OMNIPAQUE IOHEXOL 300 MG/ML  SOLN COMPARISON:  CT abdomen pelvis dated May 15, 2015. FINDINGS: Lower chest: Slightly progressed bibasilar chronic interstitial lung disease. Hepatobiliary: No focal liver abnormality. Status post cholecystectomy. Mild intra and moderate extrahepatic biliary dilatation. Air within the distal common bile duct may be related to prior sphincterotomy. Pancreas: Unremarkable. No pancreatic ductal dilatation or surrounding inflammatory changes. Spleen: Normal in size without focal abnormality. Adrenals/Urinary Tract: Stable 1.9 cm left adrenal adenoma. The right adrenal gland is unremarkable. No renal or ureteral calculi. Subcentimeter low-density lesion in the left kidney remains too  small to characterize, but is unchanged. No hydronephrosis. The bladder is unremarkable. Stomach/Bowel: Stomach is within normal limits. Appendix appears normal. No evidence of bowel wall thickening, distention, or inflammatory changes. Mild left-sided colonic diverticulosis. Vascular/Lymphatic: Aortic atherosclerosis. No enlarged abdominal or pelvic lymph nodes. Reproductive: Mild prostatomegaly. Other: No abdominal wall hernia or abnormality. No abdominopelvic ascites. No pneumoperitoneum. Musculoskeletal: No acute or significant osseous findings. Unchanged grade 1 anterolisthesis at L5-S1 due to bilateral pars defects. IMPRESSION: 1. Moderate common bile duct dilatation, measuring up to 17 mm. While this may be related to post cholecystectomy state, correlation with LFTs is recommended. If abnormal, recommend ERCP or MRCP for further evaluation. 2. Air within the distal common bile duct may be related to prior sphincterotomy. 3. Slightly progressed chronic interstitial lung disease. 4.  Aortic atherosclerosis (ICD10-I70.0). Electronically Signed   By: Titus Dubin M.D.   On: 01/07/2018 11:40   Dg Abdomen Acute W/chest  Result Date: 01/07/2018 CLINICAL DATA:  epigastric abdominal pain that started yesterday. Pt has had nausea and vomitng since 0630 yesterday and has been weak ever since. Pt is currently having sob EXAM: DG ABDOMEN ACUTE W/ 1V CHEST COMPARISON:  08/01/2015 and 05/15/2015 FINDINGS: Cervical and thoracic stimulator leads noted. Low lung volumes are present, causing crowding of the pulmonary vasculature. Mildly elevated right hemidiaphragm. Chronic mild interstitial accentuation in both lungs. Heart size within normal limits given the low lung volumes. Thoracolumbar spondylosis. Unremarkable bowel gas pattern. No free intraperitoneal gas. No significant abnormal calcifications are observed in the abdomen. No significant abnormal air-fluid levels in bowel. IMPRESSION: 1. A specific cause for the  patient's epigastric abdominal pain is not identified. Bowel gas  pattern normal. 2. Low lung volumes are present, causing crowding of the pulmonary vasculature. 3. Chronic interstitial accentuation in the lungs. Electronically Signed   By: Van Clines M.D.   On: 01/07/2018 08:04    Procedures Procedures (including critical care time)  Medications Ordered in ED Medications  piperacillin-tazobactam (ZOSYN) IVPB 3.375 g (3.375 g Intravenous New Bag/Given 01/07/18 1544)  norepinephrine (LEVOPHED) 4mg  in D5W 22mL premix infusion (10 mcg/min Intravenous Rate/Dose Verify 01/07/18 1500)  fentaNYL (SUBLIMAZE) injection 25 mcg (25 mcg Intravenous Given 01/07/18 1538)  0.9 %  sodium chloride infusion (has no administration in time range)  0.9 %  sodium chloride infusion (has no administration in time range)  heparin injection 5,000 Units (has no administration in time range)  lactated ringers infusion ( Intravenous Rate/Dose Verify 01/07/18 1500)  famotidine (PEPCID) IVPB 20 mg premix (0 mg Intravenous Hold 01/07/18 1431)  potassium chloride 10 mEq in 100 mL IVPB (10 mEq Intravenous New Bag/Given 01/07/18 1530)  fentaNYL (SUBLIMAZE) injection 100 mcg (100 mcg Intravenous Given 01/07/18 0802)  ondansetron (ZOFRAN) injection 4 mg (4 mg Intravenous Given 01/07/18 0821)  sodium chloride 0.9 % bolus 1,000 mL (0 mLs Intravenous Stopped 01/07/18 0937)  sodium chloride 0.9 % bolus 1,000 mL (0 mLs Intravenous Stopped 01/07/18 0937)  piperacillin-tazobactam (ZOSYN) IVPB 3.375 g (0 g Intravenous Stopped 01/07/18 1112)  sodium chloride 0.9 % bolus 1,000 mL (0 mLs Intravenous Stopped 01/07/18 1151)  acetaminophen (TYLENOL) tablet 650 mg (650 mg Oral Given 01/07/18 0947)  iohexol (OMNIPAQUE) 300 MG/ML solution 100 mL (100 mLs Intravenous Contrast Given 01/07/18 1054)  sodium chloride 0.9 % bolus 1,000 mL (0 mLs Intravenous Stopped 01/07/18 1302)  fentaNYL (SUBLIMAZE) injection 50 mcg (50 mcg Intravenous Given 01/07/18 1218)      Initial Impression / Assessment and Plan / ED Course  I have reviewed the triage vital signs and the nursing notes.  Pertinent labs & imaging results that were available during my care of the patient were reviewed by me and considered in my medical decision making (see chart for details).    8:16 AM Patient lactic acid came back elevated.  Does not have a fever.  Could be due to dehydration.  White count still pending.  I am not treating as a sepsis at this time since easily could be due just to his vomiting.  9:21 AM Patient developed some rigors.  Rectal temperature now done and is elevated.  LFTs have returned elevated.  Will now activate code sepsis and start antibiotics to cover intra-abdominal infection.  Will give third liter of fluid to add up to the 30/kg bolus.  CT scan still pending.  White count still pending.  Will repeat lactic acid in 1 hour to have 2-hour delta.  Patient with abdominal pain.  Right upper quadrant with elevated LFTs.  Previous cholecystectomy.  White count mildly elevated.  initial lactate elevated.  CT scan showed mildly elevated bile ducts could be normal but also with a little bit of air and no previous sphincterotomy worrisome for an ascending cholangitis.  Second lactic acid improved but did later develop some hypotension.  Will admit to ICU.  Also discussed with [facility] gastroenterology.  CRITICAL CARE Performed by: Davonna Belling Total critical care time: 45 minutes Critical care time was exclusive of separately billable procedures and treating other patients. Critical care was necessary to treat or prevent imminent or life-threatening deterioration. Critical care was time spent personally by me on the following activities: development of treatment plan with  patient and/or surrogate as well as nursing, discussions with consultants, evaluation of patient's response to treatment, examination of patient, obtaining history from patient or surrogate,  ordering and performing treatments and interventions, ordering and review of laboratory studies, ordering and review of radiographic studies, pulse oximetry and re-evaluation of patient's condition.    Final Clinical Impressions(s) / ED Diagnoses   Final diagnoses:  Septic shock [facility])  Ascending cholangitis    ED Discharge Orders    None       Davonna Belling, MD 01/07/18 [phone]

## 2018-01-07 NOTE — H&P (Addendum)
PULMONARY / CRITICAL CARE MEDICINE   Name: Alexander [name] MRN: [id number] DOB: 1951/03/30    ADMISSION DATE:  01/07/2018 CONSULTATION DATE:  8/9  REFERRING MD:  Alvino Chapel  CHIEF COMPLAINT:  abd pain, septic shock  HISTORY OF PRESENT ILLNESS:   This is a 67 year old white male with a prior history of hypertension, coronary artery disease, acute cholecystitis requiring cholecystectomy back in 2017 polio residual muscular atrophy as well as neuropathy.  Was in his usual state of health up until The evening of 8/8 when he began to complain of nausea, vomiting and right upper quadrant abdominal discomfort which radiated across the entire upper abdomen.  He said similar episodes like this in the past since his cholecystectomy however he rates typically subsided spontaneously.  He describes the pain is gnawing and burning.  There were no precipitating or relieving factors.  It was associated with some shortness of breath primarily due to pain preventing deep breathing.  He presented to the emergency room the a.m. of 8 9 as the pain was then rated at 10 out of 10.  Diagnostic evaluation in the emergency room known was was notable for temperature of 103.4, white blood cell count of 18.3, initial lactic acid was 4.12, he was tachycardic and hypotensive with systolic blood pressure in the low 80s.  He was given a total of 4 L of IV crystalloid for fluid resuscitation, IV Zosyn was initiated in the emergency room, and diagnostic imaging was obtained.  This demonstrated moderate common bile duct dilation measuring up to 17 mm.  There is also air within the distal common bile duct.  LFTs were elevated.  GI services were called for concern about acute cholangitis, critical care asked to admit for refractory hypotension, and septic shock.  Sepsis - Repeat Assessment Performed at: 1245    Vitals     Blood pressure (Abnormal) 89/57, pulse (Abnormal) 110, temperature (Abnormal) 103.4 F (39.7 C), temperature source  Rectal, resp. rate (Abnormal) 24, height 5\' 10"  (1.778 m), weight 93.9 kg, SpO2 91 %. Heart:     Tachycardic Lungs:    CTA Capillary Refill:   <2 sec Peripheral Pulse:   Radial pulse palpable Skin:     Normal Color   PAST MEDICAL HISTORY :  He  has a past medical history of Acute cholecystitis (03/2016), Arthritis, CAD S/P percutaneous coronary angioplasty (1995, 2000, 2005), Chronic pain syndrome, Essential hypertension, History of depression, History of post poliomyelitis muscular atrophy, HLD (hyperlipidemia), Ischemic cardiomyopathy, Migraines, Neuropathy, Neuropathy, NSTEMI (non-ST elevated myocardial infarction) ([place]) (04/2015), OSA (obstructive sleep apnea), PAF (paroxysmal atrial fibrillation) ([place]), Pneumonia (04/2015), Post-polio syndrome, and Shoulder pain, right.  PAST SURGICAL HISTORY: He  has a past surgical history that includes Uvulopalatopharyngoplasty, tonsillectomy and septoplasty (2002); lumbar spine stimulator; cervical spine stimulator (08/2012); Colonoscopy (09/2013); Coronary angioplasty with stent (1995, 2000, 2005); Cardiac catheterization (N/A, 04/30/2015); Cholecystectomy (06/10/2015); and transthoracic echocardiogram (04/2015).  No Known Allergies  No current facility-administered medications on file prior to encounter.    Current Outpatient Medications on File Prior to Encounter  Medication Sig  . aspirin 81 MG chewable tablet Chew 1 tablet (81 mg total) by mouth daily.  Marland Kitchen aspirin-acetaminophen-caffeine (EXCEDRIN MIGRAINE) 250-250-65 MG tablet Take 1 tablet by mouth every 6 (six) hours as needed for headache. Takes 6 tablets daily  . atorvastatin (LIPITOR) 80 MG tablet Take 0.5 tablets (40 mg total) daily at 6 PM by mouth. (Patient taking differently: Take 40 mg by mouth every other day. )  . Cholecalciferol ([employer]  VITAMIN D) 2000 UNITS CAPS Take 1 capsule (2,000 Units total) by mouth daily.  Marland Kitchen gabapentin (NEURONTIN) 600 MG tablet TAKE 2 TABLETS 3 TIMES A DAY (Patient  taking differently: Take 1,200 mg by mouth 3 (three) times daily. )  . ibuprofen (ADVIL) 200 MG tablet Take 3 tablets (600 mg total) by mouth 2 (two) times daily as needed.  Marland Kitchen lisinopril (PRINIVIL,ZESTRIL) 2.5 MG tablet Take 1 tablet (2.5 mg total) by mouth daily.  . Magnesium 250 MG TABS Take 1 tablet (250 mg total) by mouth at bedtime.  . methocarbamol (ROBAXIN) 500 MG tablet Take 500 mg by mouth as needed for muscle spasms.  . metoprolol tartrate (LOPRESSOR) 25 MG tablet Take 12.5 mg by mouth 2 (two) times daily.  . Multiple Vitamins-Minerals (MULTIVITAMIN ADULTS 50+ PO) Take 1 tablet by mouth daily.  . Omega-3 Fatty Acids (FISH OIL) 1000 MG CAPS Take 1 capsule by mouth daily.  Marland Kitchen omeprazole (PRILOSEC) 40 MG capsule Take 1 capsule (40 mg total) by mouth daily.  . sildenafil (VIAGRA) 100 MG tablet TAKE ONE-HALF TO ONE TABLET BY MOUTH DAILY AS NEEDED FOR ERECTILE DYSFUNCTION  . vitamin C (ASCORBIC ACID) 500 MG tablet Take 1,000 mg by mouth daily.  Marland Kitchen omeprazole (PRILOSEC) 40 MG capsule TAKE 1 CAPSULE BY MOUTH EVERY DAY (Patient not taking: Reported on 01/07/2018)    FAMILY HISTORY:  His family history includes Cancer (age of onset: 4) in his sister; Cancer (age of onset: 37) in his brother; Diabetes in his mother. There is no history of Coronary artery disease, Stroke, Colon cancer, Pancreatic cancer, or Stomach cancer.  SOCIAL HISTORY: He  reports that he has never smoked. He has never used smokeless tobacco. He reports that he does not drink alcohol or use drugs.  REVIEW OF SYSTEMS:   General: Positive for fever.  Generalized weakness, and malaise.  HEENT: No headache, no sore throat, no nasal discharge.  Pulmonary: Does report shortness of breath this is primarily due to abdominal discomfort preventing him from taking deep breath.  He has no cough, no wheezing.  Cardiac: Denies chest pain, palpitations, or dizziness.  Abdomen: Endorses gnawing abdominal pain right upper quadrant/epigastric  area.  Has had nausea and vomiting.  No reported diarrhea.  Pain currently tolerable to palpation.  GU: Voiding without difficulty.  Neuro: Denies memory loss, gait changes.  Does walk with crutches at baseline as well as braces given postpolio syndrome.  Endocrine.  No hot or cold abnormalities  SUBJECTIVE:  Abdominal pain a little better having received narcotic for analgesia now rates about 4 out of 10  VITAL SIGNS: Blood Pressure (Abnormal) 89/57   Pulse (Abnormal) 110   Temperature (Abnormal) 103.4 F (39.7 C) (Rectal)   Respiration (Abnormal) 24   Height 5\' 10"  (1.778 m)   Weight 93.9 kg   Oxygen Saturation 91%   Body Mass Index 29.70 kg/m    HEMODYNAMICS:    VENTILATOR SETTINGS:    INTAKE / OUTPUT: No intake/output data recorded.  PHYSICAL EXAMINATION: General: Acutely ill 67 year old white male currently lying in bed.  He is not in acute distress but does have abdominal discomfort. Neuro: Awake alert oriented no focal deficits HEENT: Normocephalic atraumatic no jugular venous distention his mucous membranes are moist.  He has dentures. Cardiovascular: Tachycardic, regular rate and rhythm.  Sinus rhythm on telemetry.  No murmur rub or gallop. Lungs: Diminished bases, no accessory use currently Abdomen: Soft, not tender to palpation currently.  Hypoactive Musculoskeletal: Equal strength and bulk.  Does have lower muscle bulk in the lower extremities following post polio syndrome Skin: Warm, dry, brisk capillary refill  LABS:  BMET Recent Labs  Lab 01/07/18 0754  NA 136  K 3.3*  CL 100  CO2 18*  BUN 14  CREATININE 0.76  GLUCOSE 116*    Electrolytes Recent Labs  Lab 01/07/18 0754  CALCIUM 9.7    CBC Recent Labs  Lab 01/07/18 0754  WBC 18.3*  HGB 15.0  HCT 43.7  PLT 206    Coag's No results for input(s): APTT, INR in the last 168 hours.  Sepsis Markers Recent Labs  Lab 01/07/18 0805 01/07/18 1003  LATICACIDVEN 4.12* 3.08*    ABG No  results for input(s): PHART, PCO2ART, PO2ART in the last 168 hours.  Liver Enzymes Recent Labs  Lab 01/07/18 0754  AST 146*  ALT 267*  ALKPHOS 138*  BILITOT 6.1*  ALBUMIN 4.0    Cardiac Enzymes No results for input(s): TROPONINI, PROBNP in the last 168 hours.  Glucose No results for input(s): GLUCAP in the last 168 hours.  STUDIES:  CT abd 8/9: CT abdomen shows moderate common bile duct dilation measuring up to 17 mm.  He has had prior cholecystectomy.  There was air within the distal common bile duct, he has had prior sphincterotomy.  CULTURES: Blood cultures 8/9>>> UC 8/9>>>  ANTIBIOTICS: Zosyn 8/9>>>  SIGNIFICANT EVENTS:   LINES/TUBES:   DISCUSSION: 67 year old white male presenting what is likely acute cholangitis and resultant septic shock.  He has received 4 L of crystalloid resuscitation remains slightly hypotensive.  He is received his antibiotics, cultures have been obtained.  We will get him to the intensive care, check for volume responsiveness with clear site device, he will likely need central access for vasoactive drips.  Gastroenterology has been consulted.  ASSESSMENT / PLAN:  Septic shock.  Presumed source acute cholangitis -Has received 4 L crystalloid resuscitation.  Lactic acid is cleared approximately 25% thus far -Cultures obtained and antibiotics initiated Plan Admit to the intensive care Empiric Zosyn day 1 Follow-up blood cultures and urine cultures Procalcitonin algorithm Once in the intensive care we will check fluid responsiveness with clear site device, looking for SV variation May require central access for norepinephrine drip, mean arterial pressure goal will be greater than 65 If central access place CVP goal 8-12 Nothing by mouth GI consulted, will likely need ERCP or MRCP  History of coronary artery disease with prior bare-metal stents last placed in 2016 History of systolic cardiomyopathy with EF 40 to 45%.  Last echocardiogram  2016 History of hypertension History of paroxysmal atrial fibrillation Plan  admit to the intensive care Holding aspirin Lopressor and lisinopril for now Continue telemetry monitoring  Lactic acidosis in setting of septic shock Lactic acid is cleared approximately 25% following volume resuscitation Plan Continue volume resuscitation, supportive care, repeat lactic acid.  Dyspnea, secondary to abdominal pain. At risk for atelectasis Plan Supplemental oxygen Treat pain Incentive spirometry  Fluid and electrolyte imbalance: Hypokalemia Plan Replace and recheck  Elevated LFTs in the setting of acute cholangitis Plan N.p.o. Trend LFTs GI consulted  History of sleep apnea, refused CPAP in past Plan Pulse oximetry at night oxygen as indicated  History of polio, with residual muscular atrophy and neuropathy Plan Holding Neurontin for now We will need his braces and walker for assistance.  FAMILY  - Updates:   - Inter-disciplinary family meet or Palliative Care meeting due by:  8/15   DVT prophylaxis: [place] heparin SUP:  H2B  Diet: NPO Activity: BR Disposition : ICU Code status: full  Erick Colace ACNP-BC [place] Pager # [phone] OR # [phone] if no answer    01/07/2018, 1:01 PM

## 2018-01-07 NOTE — Progress Notes (Signed)
Pharmacy Antibiotic Note  Alexander [name] is a 67 y.o. male admitted on 01/07/2018 with abdominal pain.  Pharmacy has been consulted for zosyn dosing. Pt with tmax of 103.4. SCr is WNL and lactic acid is elevated at 4.12.   Plan: Zosyn 3.375gm IV Q8H (4 hr inf) F/u renal fxn, C&S, clinical status and LOT *Pharmacy will sign off as no dose adjustments are anticipated. Thank you for the consult!  Height: 5\' 10"  (177.8 cm) Weight: 207 lb (93.9 kg) IBW/kg (Calculated) : 73  Temp (24hrs), Avg:99.9 F (37.7 C), Min:97.4 F (36.3 C), Max:103.4 F (39.7 C)  Recent Labs  Lab 01/07/18 0754 01/07/18 0805  WBC PENDING  --   CREATININE 0.76  --   LATICACIDVEN  --  4.12*    Estimated Creatinine Clearance: 103.2 mL/min (by C-G formula based on SCr of 0.76 mg/dL).    No Known Allergies  Antimicrobials this admission: Zosyn 8/9>>  Dose adjustments this admission: N/A  Microbiology results: Pending  Thank you for allowing pharmacy to be a part of this patient's care.  Kahlen Boyde, Rande Lawman 01/07/2018 9:23 AM

## 2018-01-07 NOTE — ED Notes (Signed)
Md pickering notified about systolic bp's dropping into the 90's. Will give ordered fluid bolus and continue to monitor.

## 2018-01-07 NOTE — ED Notes (Addendum)
Pt placed on 3L for sats in the 80's while sleeping after fentanyl given. MD Pickering notified.

## 2018-01-07 NOTE — H&P (View-Only) (Signed)
Consultation  Referring Provider: Dr. Lynetta Mare      Primary Care Physician:  [name], MD Primary Gastroenterologist: Dr. Hilarie Fredrickson        Reason for Consultation:   Abdominal pain, concern for acute cholangitis         HPI:   Alexander [name] is a 67 y.o. 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      Today, explains that he has had some "off and on" episodes of "burning/gnawing" in his epigastrum which he has related to spicy/hot foods, etc over the past few months. Then, 01/05/18 he woke up and had some yogurt and started with a more severe one of these episodes. He became nauseous and started vomiting as well as running a fever. This continued into yesterday when his wife finally brought him to the ER. Associated symptoms included some shortness of breath primarily due to pain preventing deep breathing.   History positive for cholecystectomy in 2017. Also had percutaneous drain at that time. Has not eaten since yesterday morning, taking sips of water here and there but these cause worsening of the "burning sensation", in his epigastrum seconds after swallowing.   Denies weight loss.  ER course: Temp 103.4, white blood cell count 18.3, initial lactic acid was 4.12, tachycardic and hypotensive with systolic blood pressure in the low 80s, IV Zosyn was initiated, CT showed moderate common bile duct dilation measuring up to 17 mm and air within the distal common bile duct, LFTs elevated  Previous GI history: 10/19/2013 colonoscopy: Dr. Hilarie Fredrickson: 2 sessile polyps measuring 5-7 mm in size of the hepatic flexure, sessile polyp measuring 3-4 mm in size of the transverse colon  Past Medical History:  Diagnosis  Date  . Acute cholecystitis 03/2016   with sepsis  . Arthritis   . CAD S/P percutaneous coronary angioplasty 1995, 2000, 2005   a. 1995 s/p BMS;  b. 2000 s/p BMS;  c. 2005 s/p stent - All stents in [facility] (RCA, LAD & OM - unknown on which date);  d. 04/2015 NSTEMI/Cath: LM nl, ost/p LAD 20%, patent mLAD stent, RI small, OM2 patent stent, pRCA 20%, patent stent, EF 45-50%-->Med Rx.  . Chronic pain syndrome    a. Followed @ [facility];  b. Uses 12-14 excedrins per day.  . Essential hypertension   . History of depression   . History of post poliomyelitis muscular atrophy   . HLD (hyperlipidemia)   . Ischemic cardiomyopathy    a. 04/2015 Echo: EF 40-45%, mod antsept, ant, antlat, apical HK, mild AI/MR, PASP 70mmHg.  . Migraines   . Neuropathy   . 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 NSTEMI (non-ST elevated myocardial infarction) ([place]) 04/2015   - Demand Ischemia in setting of Sepsis (also with prior MI history  . OSA (obstructive sleep apnea)    does not want to use CPAP, using mouth guard  . PAF (paroxysmal atrial fibrillation) ([place])    a. 04/2015 in setting of cholecystits and sepsis -->Amio;  b. CHA2DS2VASc = 4--> Eliquis.  . Pneumonia 04/2015  . Post-polio syndrome    a. ambulates with braces.  . Shoulder pain, right     Past Surgical History:  Procedure Laterality Date  . CARDIAC CATHETERIZATION N/A 04/30/2015   Procedure: Left Heart Cath and Coronary Angiography;  Surgeon: Troy Sine, MD;  Location: [facility];  Service: Cardiovascular; LM nl, ost/p LAD 20%, patent mLAD stent, RI small, OM2 patent stent, pRCA 20%, patent stent, EF 45-50%-->Med Rx   . cervical spine stimulator  08/2012  . CHOLECYSTECTOMY  06/10/2015   Procedure: LAPAROSCOPIC CHOLECYSTECTOMY;  Surgeon: Mickeal Skinner, MD;  Location: [facility];  Service: General;;  . COLONOSCOPY  09/2013   TA x2, rpt 5 yrs ([place])  . CORONARY ANGIOPLASTY WITH STENT PLACEMENT  1995, 2000, 2005   stents  in RCA, LAD & Cx-OM. -Stents were patent by cardiac catheterization in November 2016  . lumbar spine stimulator    . TRANSTHORACIC ECHOCARDIOGRAM  04/2015   EF 40-45%, mod antsept, ant, antlat, apical HK, mild AI/MR, PASP 29mmHg.  Marland Kitchen UVULOPALATOPHARYNGOPLASTY, TONSILLECTOMY AND SEPTOPLASTY  2002    Family History  Problem Relation Age of Onset  . Diabetes Mother   . Cancer Sister 71       male cancer  . Cancer Brother 60       breast  . Coronary artery disease Neg Hx   . Stroke Neg Hx   . Colon cancer Neg Hx   . Pancreatic cancer Neg Hx   . Stomach cancer Neg Hx     Social History   Tobacco Use  . Smoking status: Never Smoker  . Smokeless tobacco: Never Used  Substance Use Topics  . Alcohol use: No    Alcohol/week: 0.0 standard drinks  . Drug use: No    Prior to Admission medications   Medication Sig Start Date End Date Taking? Authorizing Provider  aspirin 81 MG chewable tablet Chew 1 tablet (81 mg total) by mouth daily. 05/02/15  Yes Thurnell Lose, MD  aspirin-acetaminophen-caffeine (EXCEDRIN MIGRAINE) [phone] MG tablet Take 1 tablet by mouth every 6 (six) hours as needed for headache. Takes 6 tablets daily 11/11/17  Yes [name], MD  atorvastatin (LIPITOR) 80 MG tablet Take 0.5 tablets (40 mg total) daily at 6 PM by mouth. Patient taking differently: Take 40 mg by mouth every other day.  04/08/17  Yes Leonie Man, MD  Cholecalciferol ([employer] VITAMIN D) 2000 UNITS CAPS Take 1 capsule (2,000 Units total) by mouth daily. 03/29/13  Yes [name], MD  gabapentin (NEURONTIN) 600 MG tablet TAKE 2 TABLETS 3 TIMES A DAY Patient taking differently: Take 1,200 mg by mouth 3 (three) times daily.  11/11/17  Yes [name], MD  ibuprofen (ADVIL) 200 MG tablet Take 3 tablets (600 mg total) by mouth 2 (two) times daily as needed. 11/11/17  Yes [name], MD  lisinopril (PRINIVIL,ZESTRIL) 2.5 MG tablet Take 1 tablet (2.5 mg total) by mouth daily.  11/05/17  Yes [name], MD  Magnesium 250 MG TABS Take 1 tablet (250 mg total) by mouth at bedtime. 11/11/17  Yes [name], MD  methocarbamol (ROBAXIN) 500 MG tablet Take 500 mg by mouth as needed for muscle spasms.   Yes [provider]  metoprolol tartrate (LOPRESSOR) 25 MG tablet Take 12.5 mg by mouth 2 (two) times daily.   Yes [provider]  Multiple Vitamins-Minerals (MULTIVITAMIN ADULTS 50+ PO) Take 1 tablet by mouth daily.   Yes [provider]  Omega-3 Fatty Acids (FISH OIL) 1000 MG CAPS Take 1 capsule by mouth daily.   Yes [provider]  omeprazole (PRILOSEC) 40 MG capsule  Take 1 capsule (40 mg total) by mouth daily. 11/05/17  Yes [name], MD  sildenafil (VIAGRA) 100 MG tablet TAKE ONE-HALF TO ONE TABLET BY MOUTH DAILY AS NEEDED FOR ERECTILE DYSFUNCTION 11/03/17  Yes [name], MD  vitamin C (ASCORBIC ACID) 500 MG tablet Take 1,000 mg by mouth daily.   Yes [provider]  omeprazole (PRILOSEC) 40 MG capsule TAKE 1 CAPSULE BY MOUTH EVERY DAY Patient not taking: Reported on 01/07/2018 12/01/17   [name], MD    Current Facility-Administered Medications  Medication Dose Route Frequency Provider Last Rate Last Dose  . 0.9 %  sodium chloride infusion  250 mL Intravenous PRN Erick Colace, NP      . 0.9 %  sodium chloride infusion  500 mL Intravenous Q30 min PRN Erick Colace, NP      . 0.9 %  sodium chloride infusion   Intravenous Continuous Erick Colace, NP      . famotidine (PEPCID) IVPB 20 mg premix  20 mg Intravenous Q12H Erick Colace, NP      . fentaNYL (SUBLIMAZE) injection 25 mcg  25 mcg Intravenous Q2H PRN Erick Colace, NP      . heparin injection 5,000 Units  5,000 Units Subcutaneous Q8H Erick Colace, NP      . lactated ringers infusion   Intravenous Continuous Erick Colace, NP      . norepinephrine (LEVOPHED) 4mg  in D5W 240mL premix infusion  5-50 mcg/min Intravenous  Titrated Erick Colace, NP 37.5 mL/hr at 01/07/18 1331 10 mcg/min at 01/07/18 1331  . piperacillin-tazobactam (ZOSYN) IVPB 3.375 g  3.375 g Intravenous Q8H Erick Colace, NP        Allergies as of 01/07/2018  . (No Known Allergies)     Review of Systems:    Constitutional: No weight loss Skin: No rash or itching Cardiovascular: No chest pain Respiratory: +SOB Gastrointestinal: See HPI and otherwise negative Genitourinary: No dysuria Neurological: No headache Musculoskeletal: No new muscle or joint pain Hematologic: No bleeding  Psychiatric: No history of depression or anxiety    Physical Exam:  Vital signs in last 24 hours: Temp:  [97.4 F (36.3 C)-103.4 F (39.7 C)] 103.4 F (39.7 C) (08/09 0917) Pulse Rate:  [71-112] 106 (08/09 1328) Resp:  [12-34] 22 (08/09 1328) BP: (82-143)/(44-94) 85/58 (08/09 1328) SpO2:  [91 %-100 %] 99 % (08/09 1328) Weight:  [93.9 kg] 93.9 kg (08/09 0654)   General:   Pleasant Caucasian male appears to be in NAD, Well developed, Well nourished, alert and cooperative Head:  Normocephalic and atraumatic. Eyes:   PEERL, EOMI. No icterus. Conjunctiva pink. Ears:  Normal auditory acuity. Neck:  Supple Throat: Oral cavity and pharynx without inflammation, swelling or lesion.  Lungs: Respirations even and unlabored. Lungs clear to auscultation bilaterally.   No wheezes, crackles, or rhonchi.  Heart: Normal S1, S2. No MRG. Regular rate and rhythm. No peripheral edema, cyanosis or pallor.  Abdomen:  Soft, nondistended, Marked ttp across epigastrum, with some involuntary guarding, Normal bowel sounds. No appreciable masses or hepatomegaly. Rectal:  Not performed.  Msk:  Symmetrical without gross deformities. Peripheral pulses intact.  Extremities:  Without edema, no deformity or joint abnormality.  Neurologic:  Alert and  oriented x4;  grossly normal neurologically.  Skin:   Dry and intact without significant lesions or rashes. Psychiatric:  Demonstrates good judgement and reason without abnormal affect or behaviors.   LAB RESULTS: Recent Labs    01/07/18  0754  WBC 18.3*  HGB 15.0  HCT 43.7  PLT 206   BMET Recent Labs    01/07/18 0754  NA 136  K 3.3*  CL 100  CO2 18*  GLUCOSE 116*  BUN 14  CREATININE 0.76  CALCIUM 9.7   LFT Recent Labs    01/07/18 0754  PROT 7.5  ALBUMIN 4.0  AST 146*  ALT 267*  ALKPHOS 138*  BILITOT 6.1*   STUDIES: Ct Abdomen Pelvis W Contrast  Result Date: 01/07/2018 CLINICAL DATA:  Epigastric pain for the past 2 days with nausea and vomiting. EXAM: CT ABDOMEN AND PELVIS WITH CONTRAST TECHNIQUE: Multidetector CT imaging of the abdomen and pelvis was performed using the standard protocol following bolus administration of intravenous contrast. CONTRAST:  114mL OMNIPAQUE IOHEXOL 300 MG/ML  SOLN COMPARISON:  CT abdomen pelvis dated May 15, 2015. FINDINGS: Lower chest: Slightly progressed bibasilar chronic interstitial lung disease. Hepatobiliary: No focal liver abnormality. Status post cholecystectomy. Mild intra and moderate extrahepatic biliary dilatation. Air within the distal common bile duct may be related to prior sphincterotomy. Pancreas: Unremarkable. No pancreatic ductal dilatation or surrounding inflammatory changes. Spleen: Normal in size without focal abnormality. Adrenals/Urinary Tract: Stable 1.9 cm left adrenal adenoma. The right adrenal gland is unremarkable. No renal or ureteral calculi. Subcentimeter low-density lesion in the left kidney remains too small to characterize, but is unchanged. No hydronephrosis. The bladder is unremarkable. Stomach/Bowel: Stomach is within normal limits. Appendix appears normal. No evidence of bowel wall thickening, distention, or inflammatory changes. Mild left-sided colonic diverticulosis. Vascular/Lymphatic: Aortic atherosclerosis. No enlarged abdominal or pelvic lymph nodes. Reproductive: Mild prostatomegaly. Other: No abdominal wall hernia or  abnormality. No abdominopelvic ascites. No pneumoperitoneum. Musculoskeletal: No acute or significant osseous findings. Unchanged grade 1 anterolisthesis at L5-S1 due to bilateral pars defects. IMPRESSION: 1. Moderate common bile duct dilatation, measuring up to 17 mm. While this may be related to post cholecystectomy state, correlation with LFTs is recommended. If abnormal, recommend ERCP or MRCP for further evaluation. 2. Air within the distal common bile duct may be related to prior sphincterotomy. 3. Slightly progressed chronic interstitial lung disease. 4.  Aortic atherosclerosis (ICD10-I70.0). Electronically Signed   By: Titus Dubin M.D.   On: 01/07/2018 11:40   Dg Abdomen Acute W/chest  Result Date: 01/07/2018 CLINICAL DATA:  epigastric abdominal pain that started yesterday. Pt has had nausea and vomitng since 0630 yesterday and has been weak ever since. Pt is currently having sob EXAM: DG ABDOMEN ACUTE W/ 1V CHEST COMPARISON:  08/01/2015 and 05/15/2015 FINDINGS: Cervical and thoracic stimulator leads noted. Low lung volumes are present, causing crowding of the pulmonary vasculature. Mildly elevated right hemidiaphragm. Chronic mild interstitial accentuation in both lungs. Heart size within normal limits given the low lung volumes. Thoracolumbar spondylosis. Unremarkable bowel gas pattern. No free intraperitoneal gas. No significant abnormal calcifications are observed in the abdomen. No significant abnormal air-fluid levels in bowel. IMPRESSION: 1. A specific cause for the patient's epigastric abdominal pain is not identified. Bowel gas pattern normal. 2. Low lung volumes are present, causing crowding of the pulmonary vasculature. 3. Chronic interstitial accentuation in the lungs. Electronically Signed   By: Van Clines M.D.   On: 01/07/2018 08:04     Impression / Plan:   Impression: 1.  Acute cholangitis: seen on CT, h/o cholecystectomy in 2017, concern for choledocholithiasis with  dilated CBD 2.  Septic shock: related to above 3.  Systolic cardiomyopathy with EF 40-45% last echo 2016 4.  A. Fib  Plan: 1.  Agree with Zosyn 2.  MRCP ordered for today 3. Will plan for ERCP tomorrow with Dr. Lyndel Safe, pending results of MRCP today.  Did discuss risks, benefits, limitations and alternatives and the patient agrees to proceed. 4. Please await any further recommendations from Dr. Lyndel Safe later today.  Thank you for your kind consultation, we will continue to follow.  Alexander [name]  01/07/2018, 1:58 PM   Attending physician's note   I have taken an interval history, reviewed the chart and examined the patient. I agree with the Advanced Practitioner's note, impression and recommendations.   67 year old with A. fib, CHF, sleep apnea, history of cholecystostomy followed by cholecystectomy in 2017 with abdominal pain, jaundice fever with chills with septic shock. CT showing dilated common bile duct.  Highly suggestive of ascending cholangitis with choledocholithiasis. MRCP could not be performed due to lumbar spine stimulator.  Plan: Broad-spectrum antibiotics, ERCP in AM (could not be done today due to scheduling constraints).  I have discussed risks and benefits in detail with the patient and patient's wife including pancreatitis, bleeding, perforation, anesthesia complications and rarely death.  Alternatives were given.  They wish to proceed.  Carmell Austria, MD

## 2018-01-08 ENCOUNTER — Inpatient Hospital Stay (HOSPITAL_COMMUNITY): Payer: Medicare Other | Admitting: Certified Registered Nurse Anesthetist

## 2018-01-08 ENCOUNTER — Encounter (HOSPITAL_COMMUNITY)
Admission: EM | Disposition: A | Discharge status: Home / Self Care | Payer: Self-pay | Source: Home / Self Care | Attending: Pulmonary Disease

## 2018-01-08 ENCOUNTER — Encounter (HOSPITAL_COMMUNITY): Payer: Self-pay | Admitting: *Deleted

## 2018-01-08 ENCOUNTER — Inpatient Hospital Stay (HOSPITAL_COMMUNITY): Payer: Medicare Other

## 2018-01-08 DIAGNOSIS — K315 Obstruction of duodenum: Secondary | ICD-10-CM

## 2018-01-08 DIAGNOSIS — R6521 Severe sepsis with septic shock: Secondary | ICD-10-CM

## 2018-01-08 DIAGNOSIS — K8309 Other cholangitis: Secondary | ICD-10-CM

## 2018-01-08 HISTORY — PX: ERCP: SHX5425

## 2018-01-08 HISTORY — PX: SPHINCTEROTOMY: SHX5279

## 2018-01-08 HISTORY — PX: BILIARY STENT PLACEMENT: SHX5538

## 2018-01-08 LAB — BASIC METABOLIC PANEL
Anion gap: 8 (ref 5–15)
BUN: 14 mg/dL (ref 8–23)
CALCIUM: 7.9 mg/dL — AB (ref 8.9–10.3)
CO2: 20 mmol/L — ABNORMAL LOW (ref 22–32)
CREATININE: 0.72 mg/dL (ref 0.61–1.24)
Chloride: 109 mmol/L (ref 98–111)
Glucose, Bld: 100 mg/dL — ABNORMAL HIGH (ref 70–99)
Potassium: 3.2 mmol/L — ABNORMAL LOW (ref 3.5–5.1)
SODIUM: 137 mmol/L (ref 135–145)

## 2018-01-08 LAB — CBC
HCT: 35.6 % — ABNORMAL LOW (ref 39.0–52.0)
HEMOGLOBIN: 12.2 g/dL — AB (ref 13.0–17.0)
MCH: 32.1 pg (ref 26.0–34.0)
MCHC: 34.3 g/dL (ref 30.0–36.0)
MCV: 93.7 fL (ref 78.0–100.0)
PLATELETS: 103 10*3/uL — AB (ref 150–400)
RBC: 3.8 MIL/uL — ABNORMAL LOW (ref 4.22–5.81)
RDW: 13.7 % (ref 11.5–15.5)
WBC: 23.5 10*3/uL — ABNORMAL HIGH (ref 4.0–10.5)

## 2018-01-08 LAB — HEPATIC FUNCTION PANEL
ALK PHOS: 112 U/L (ref 38–126)
ALT: 137 U/L — ABNORMAL HIGH (ref 0–44)
AST: 50 U/L — ABNORMAL HIGH (ref 15–41)
Albumin: 2.8 g/dL — ABNORMAL LOW (ref 3.5–5.0)
BILIRUBIN DIRECT: 3 mg/dL — AB (ref 0.0–0.2)
BILIRUBIN INDIRECT: 1.5 mg/dL — AB (ref 0.3–0.9)
BILIRUBIN TOTAL: 4.5 mg/dL — AB (ref 0.3–1.2)
TOTAL PROTEIN: 5.8 g/dL — AB (ref 6.5–8.1)

## 2018-01-08 LAB — MAGNESIUM: MAGNESIUM: 1.6 mg/dL — AB (ref 1.7–2.4)

## 2018-01-08 LAB — LACTIC ACID, PLASMA: LACTIC ACID, VENOUS: 1.6 mmol/L (ref 0.5–1.9)

## 2018-01-08 LAB — PROTIME-INR
INR: 1.53
Prothrombin Time: 18.3 seconds — ABNORMAL HIGH (ref 11.4–15.2)

## 2018-01-08 LAB — HIV ANTIBODY (ROUTINE TESTING W REFLEX): HIV SCREEN 4TH GENERATION: NONREACTIVE

## 2018-01-08 LAB — PHOSPHORUS: PHOSPHORUS: 2.9 mg/dL (ref 2.5–4.6)

## 2018-01-08 SURGERY — ERCP, WITH INTERVENTION IF INDICATED
Anesthesia: General

## 2018-01-08 MED ORDER — ONDANSETRON HCL 4 MG/2ML IJ SOLN
INTRAMUSCULAR | Status: DC | PRN
  Administered 2018-01-08: 4 mg via INTRAVENOUS

## 2018-01-08 MED ORDER — GLUCAGON HCL RDNA (DIAGNOSTIC) 1 MG IJ SOLR
INTRAMUSCULAR | Status: DC | PRN
  Administered 2018-01-08 (×2): .5 mg via INTRAVENOUS

## 2018-01-08 MED ORDER — ONDANSETRON HCL 4 MG/2ML IJ SOLN
4.0000 mg | Freq: Four times a day (QID) | INTRAMUSCULAR | Status: DC | PRN
  Administered 2018-01-08: 4 mg via INTRAVENOUS
  Filled 2018-01-08: qty 2

## 2018-01-08 MED ORDER — MAGNESIUM SULFATE 4 GM/100ML IV SOLN
4.0000 g | Freq: Once | INTRAVENOUS | Status: AC
  Administered 2018-01-08: 4 g via INTRAVENOUS
  Filled 2018-01-08: qty 100

## 2018-01-08 MED ORDER — FENTANYL CITRATE (PF) 250 MCG/5ML IJ SOLN
INTRAMUSCULAR | Status: DC | PRN
  Administered 2018-01-08: 25 ug via INTRAVENOUS
  Administered 2018-01-08: 100 ug via INTRAVENOUS

## 2018-01-08 MED ORDER — GABAPENTIN 600 MG PO TABS
1200.0000 mg | ORAL_TABLET | Freq: Three times a day (TID) | ORAL | Status: DC
  Administered 2018-01-08 – 2018-01-09 (×3): 1200 mg via ORAL
  Filled 2018-01-08 (×5): qty 2

## 2018-01-08 MED ORDER — PROPOFOL 10 MG/ML IV BOLUS
INTRAVENOUS | Status: DC | PRN
  Administered 2018-01-08: 100 mg via INTRAVENOUS
  Administered 2018-01-08: 30 mg via INTRAVENOUS

## 2018-01-08 MED ORDER — POTASSIUM CHLORIDE 10 MEQ/100ML IV SOLN
INTRAVENOUS | Status: AC
  Administered 2018-01-08: 10 meq via INTRAVENOUS
  Filled 2018-01-08: qty 100

## 2018-01-08 MED ORDER — SODIUM CHLORIDE 0.9 % IV SOLN
INTRAVENOUS | Status: DC | PRN
  Administered 2018-01-08: 100 mL

## 2018-01-08 MED ORDER — POTASSIUM CHLORIDE 10 MEQ/100ML IV SOLN
10.0000 meq | INTRAVENOUS | Status: AC
  Administered 2018-01-08 (×6): 10 meq via INTRAVENOUS
  Filled 2018-01-08 (×4): qty 100

## 2018-01-08 MED ORDER — INDOMETHACIN 50 MG RE SUPP
RECTAL | Status: AC
  Filled 2018-01-08: qty 2

## 2018-01-08 MED ORDER — INDOMETHACIN 50 MG RE SUPP
RECTAL | Status: DC | PRN
  Administered 2018-01-08: 100 mg via RECTAL

## 2018-01-08 MED ORDER — MIDAZOLAM HCL 2 MG/2ML IJ SOLN
INTRAMUSCULAR | Status: DC | PRN
  Administered 2018-01-08: 1 mg via INTRAVENOUS

## 2018-01-08 MED ORDER — SUCCINYLCHOLINE CHLORIDE 200 MG/10ML IV SOSY
PREFILLED_SYRINGE | INTRAVENOUS | Status: DC | PRN
  Administered 2018-01-08: 60 mg via INTRAVENOUS

## 2018-01-08 MED ORDER — GLUCAGON HCL RDNA (DIAGNOSTIC) 1 MG IJ SOLR
INTRAMUSCULAR | Status: AC
Start: 2018-01-08 — End: ?
  Filled 2018-01-08: qty 1

## 2018-01-08 MED ORDER — IOPAMIDOL (ISOVUE-300) INJECTION 61%
INTRAVENOUS | Status: AC
  Filled 2018-01-08: qty 50

## 2018-01-08 SURGICAL SUPPLY — 1 items: Plastic Biliary Stent (Stent) ×2 IMPLANT

## 2018-01-08 NOTE — Progress Notes (Signed)
eLink Physician-Brief Progress Note Patient Name: Alexander [name] DOB: [birth year] MRN: [id number]   Date of Service  01/08/2018  HPI/Events of Note  Hypokalemia and hypomag  eICU Interventions  Potassium and Mag replaced     Intervention Category Intermediate Interventions: Electrolyte abnormality - evaluation and management  DETERDING,ELIZABETH 01/08/2018, 5:56 AM

## 2018-01-08 NOTE — Progress Notes (Signed)
Pt to 46M from endo -pt recovered in room on 46M by PACU RN

## 2018-01-08 NOTE — Op Note (Signed)
[facility] Patient Name: Alexander [name] Procedure Date : 01/08/2018 MRN: [id number] Attending MD: Jackquline Denmark , MD Date of Birth: 08-05-1950 CSN: [id number] Age: 67 Admit Type: Inpatient Procedure:                ERCP Indications:              67 year old with A. fib, CHF, sleep apnea, history                            of cholecystostomy followed by cholecystectomy in                            2017 with Ascending cholangitis with septic shock.                            CT showing dilated common bile duct. MRCP could not                            be performed due to lumbar spine stimulator. Providers:                Jackquline Denmark, MD, Elna Breslow, RN, William Dalton, Technician Referring MD:              Medicines:                Monitored Anesthesia Care Complications:            No immediate complications. Estimated Blood Loss:     Estimated blood loss was minimal. Procedure:                Pre-Anesthesia Assessment:                           - Prior to the procedure, a History and Physical                            was performed, and patient medications and                            allergies were reviewed. [name] tolerance of                            previous anesthesia was also reviewed. The risks                            and benefits of the procedure and the sedation                            options and risks were discussed with the patient.                            All questions were answered, and informed consent  was obtained. Prior Anticoagulants: The patient has                            taken no previous anticoagulant or antiplatelet                            agents. ASA Grade Assessment: III - A patient with                            severe systemic disease. After reviewing the risks                            and benefits, the patient was deemed in   satisfactory condition to undergo the procedure.                           After obtaining informed consent, the scope was                            passed under direct vision. Throughout the                            procedure, the patient's blood pressure, pulse, and                            oxygen saturations were monitored continuously. The                            [id number]-[id number] ([id number]) Olympus ERCP was introduced                            through the mouth, and used to inject contrast into                            and used to inject contrast into the bile duct. The                            ERCP was technically difficult and complex due to                            abnormal anatomy. The patient tolerated the                            procedure well. Scope In: Scope Out: Findings:      Duodenal stenosis with altered anatomy making ERCP difficult. The bile       duct was deeply cannulated with the short-nosed traction sphincterotome.       Contrast was injected. I personally interpreted the bile duct images.       Dilated CBD 2 cm with multiple filling defects consistent with multiple       stones and sludge. Copious amount of pus came out consistent with       ascending cholangitis. Limited biliary sphincterotomy was made with a       traction (standard) sphincterotome using ERBE electrocautery  as catheter       could not be oriented well. There was no post-sphincterotomy bleeding.       One 8.5 Fr by 5 cm plastic stent was placed into the common bile duct.       Pus flowed through the stent. The stent was in good position.       Radiographic documentation was obtained.      Limited pancreatogram - normal PD in the head of the pancreas. PD in the       body and the tail of the pancreas did not fill (intentionally). Impression:               - Duodenal stenosis.                           - Ascending cholangitis due to multiple filling                            defects  consistent with a stones and sludge status                            post limited biliary sphincterotomy and endobiliary                            stent insertion with good drainage of pus. Recommendation:           - Observe patient for any immediate or delayed                            complications.                           - Indocin suppositories -2.                           - Return to my partner (Dr Luciana Axe) for                            stone removal/stent removal at ERCP in 6-8 weeks                            electively.                           - Continue broad-spectrum antibiotics for 7-10 days.                           - Discussed with patient and patient's wife. Procedure Code(s):        --- Professional ---                           [id number], Endoscopic retrograde                            cholangiopancreatography (ERCP); with placement of                            endoscopic stent into  biliary or pancreatic duct,                            including pre- and post-dilation and guide wire                            passage, when performed, including sphincterotomy,                            when performed, each stent                           [id number], Endoscopic catheterization of the biliary                            ductal system, radiological supervision and                            interpretation Diagnosis Code(s):        --- Professional ---                           K80.50, Calculus of bile duct without cholangitis                            or cholecystitis without obstruction                           R93.2, Abnormal findings on diagnostic imaging of                            liver and biliary tract CPT copyright 2017 [employer]. All rights reserved. The codes documented in this report are preliminary and upon coder review may  be revised to meet current compliance requirements. Jackquline Denmark, MD 01/08/2018 4:29:05 PM This report  has been signed electronically. Number of Addenda: 0

## 2018-01-08 NOTE — Progress Notes (Signed)
PULMONARY / CRITICAL CARE MEDICINE   Name: Alexander [name] MRN: [id number] DOB: 30-Nov-1950    ADMISSION DATE:  01/07/2018 CONSULTATION DATE:  8/9  REFERRING MD:  Alvino Chapel  CHIEF COMPLAINT:  abd pain, septic shock  HISTORY OF PRESENT ILLNESS:   This is a 67 year old white male with a prior history of hypertension, coronary artery disease, acute cholecystitis requiring cholecystectomy back in 2017 polio residual muscular atrophy as well as neuropathy.  Was in his usual state of health up until The evening of 8/8 when he began to complain of nausea, vomiting and right upper quadrant abdominal discomfort which radiated across the entire upper abdomen.  He said similar episodes like this in the past since his cholecystectomy however he rates typically subsided spontaneously.  He describes the pain is gnawing and burning.  There were no precipitating or relieving factors.  It was associated with some shortness of breath primarily due to pain preventing deep breathing.  He presented to the emergency room the a.m. of 8 9 as the pain was then rated at 10 out of 10.  Diagnostic evaluation in the emergency room known was was notable for temperature of 103.4, white blood cell count of 18.3, initial lactic acid was 4.12, he was tachycardic and hypotensive with systolic blood pressure in the low 80s.  He was given a total of 4 L of IV crystalloid for fluid resuscitation, IV Zosyn was initiated in the emergency room, and diagnostic imaging was obtained.  This demonstrated moderate common bile duct dilation measuring up to 17 mm.  There is also air within the distal common bile duct.  LFTs were elevated.  GI services were called for concern about acute cholangitis, critical care asked to admit for refractory hypotension, and septic shock.         SUBJECTIVE:  Much improved on 01/08/2018.  Off oxygen.  Not on pressure support.  Replete potassium and magnesium.  VITAL SIGNS: BP 110/75   Pulse 76   Temp 98.8 F  (37.1 C) (Oral)   Resp 17   Ht 5\' 10"  (1.778 m)   Wt 96.7 kg   SpO2 98%   BMI 30.59 kg/m   HEMODYNAMICS:    VENTILATOR SETTINGS:    INTAKE / OUTPUT: I/O last 3 completed shifts: In: 5743.7 [I.V.:1979.3; IV Piggyback:3764.4] Out: 1125 [Urine:1125]  PHYSICAL EXAMINATION: General: Alert orientated no acute distress currently on room air HEENT: No JVD or lymphadenopathy is appreciated Neuro: Follows commands awake alert no acute distress.  Muscle wasting of the lower extremities from history of polio CV: Heart sounds are regular blood pressure 110/75 heart rate 74 PULM: even/non-labored, lungs bilaterally clear GI: Tender to touch.  Bowel sounds are present.  Noted to guard abdomen Extremities: warm/dry, muscular atrophy lower extremities Skin: no rashes or lesions   LABS:  BMET Recent Labs  Lab 01/07/18 0754 01/07/18 1402 01/08/18 0328  NA 136 138 137  K 3.3* 2.5* 3.2*  CL 100 109 109  CO2 18* 17* 20*  BUN 14 13 14   CREATININE 0.76 0.83 0.72  GLUCOSE 116* 100* 100*    Electrolytes Recent Labs  Lab 01/07/18 0754 01/07/18 1402 01/08/18 0328  CALCIUM 9.7 8.0* 7.9*  MG  --   --  1.6*  PHOS  --   --  2.9    CBC Recent Labs  Lab 01/07/18 0754 01/08/18 0328  WBC 18.3* 23.5*  HGB 15.0 12.2*  HCT 43.7 35.6*  PLT 206 103*    Coag's Recent Labs  Lab 01/07/18 1401 01/08/18 0328  APTT 35  --   INR 1.31 1.53    Sepsis Markers Recent Labs  Lab 01/07/18 0805 01/07/18 1003 01/07/18 1359 01/07/18 1401  LATICACIDVEN 4.12* 3.08* 2.2*  --   PROCALCITON  --   --   --  4.50    ABG No results for input(s): PHART, PCO2ART, PO2ART in the last 168 hours.  Liver Enzymes Recent Labs  Lab 01/07/18 0754  AST 146*  ALT 267*  ALKPHOS 138*  BILITOT 6.1*  ALBUMIN 4.0    Cardiac Enzymes Recent Labs  Lab 01/07/18 1401  TROPONINI 0.06*    Glucose Recent Labs  Lab 01/07/18 1403  GLUCAP 101*    STUDIES:  CT abd 8/9: CT abdomen shows moderate  common bile duct dilation measuring up to 17 mm.  He has had prior cholecystectomy.  There was air within the distal common bile duct, he has had prior sphincterotomy. Note MRI canceled due to implanted pain delivery system CULTURES: Blood cultures 8/9>> E. coli>>> UC 8/9>>>  ANTIBIOTICS: Zosyn 8/9>>>  SIGNIFICANT EVENTS:   LINES/TUBES:   DISCUSSION: 67 year old white male presenting what is likely acute cholangitis and resultant septic shock.  He has received 4 L of crystalloid resuscitation remains slightly hypotensive.  He is received his antibiotics, cultures have been obtained.  We will get him to the intensive care, check for volume responsiveness with clear site device, he will likely need central access for vasoactive drips.  Gastroenterology has been consulted.  ASSESSMENT / PLAN:  Septic shock.  Presumed source acute cholangitis -Has received 4 L crystalloid resuscitation.  Lactic acid is cleared approximately 25% thus far -Cultures obtained and antibiotics initiated -4 of 4 blood cultures positive for E. coli Plan Monitor in ICU Day 2 of Zosyn GI is following plan for ERCP in the near future He is currently hemodynamically stable not on pressors Continue to care from an ICU especially since the ERCP is planned for 01/08/2018.  History of coronary artery disease with prior bare-metal stents last placed in 2016 History of systolic cardiomyopathy with EF 40 to 45%.  Last echocardiogram 2016 History of hypertension History of paroxysmal atrial fibrillation Plan  ICU monitoring Hold antihypertensives for now  Lactic acidosis in setting of septic shock Lactic acid is cleared approximately 25% following volume resuscitation Plan Continue volume resuscitation, supportive care, repeat lactic acid.  Dyspnea, secondary to abdominal pain. At risk for atelectasis Plan O2 as needed Incentive spirometer  Fluid and electrolyte imbalance:   Hypokalemia Hypo-magnesium Plan 01/08/2018 repleted Recheck electrolytes 01/09/2018  Elevated LFTs in the setting of acute cholangitis Plan Continue n.p.o. Follow LFTs Gastroenterology is following there is questionable plan for ERCP today 01/08/2018  History of sleep apnea, refused CPAP in past Plan Continue to monitor pulse oximetry O2 if needed to keep sats greater than 92%  History of polio, with residual muscular atrophy and neuropathy Plan Continue to hold Neurontin Note he has an implanted pain delivery system in his left lower back.  This precludes any type of MRI.  FAMILY  - Updates: 01/08/2018 wife and patient updated at length at bedside.  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 Blood cultures are showing E. coli. Imaging showed 17 mm CBD dilatation and ERCP is planned today for presumptive diagnosis of ascending cholangitis. Septic shock appears to be resolved, hypokalemia and hypomagnesemia will be repleted He will be transferred to triad service on 8/11 and can be transferred out of ICU after ERCP if this is uneventful  Rosa Gambale V. Elsworth Soho MD

## 2018-01-08 NOTE — Anesthesia Postprocedure Evaluation (Signed)
Anesthesia Post Note  Patient: Alexander [name]  Procedure(s) Performed: ENDOSCOPIC RETROGRADE CHOLANGIOPANCREATOGRAPHY (ERCP) (N/A ) SPHINCTEROTOMY BILIARY STENT PLACEMENT (plastic)     Patient location during evaluation: ICU Anesthesia Type: General Level of consciousness: awake and patient cooperative Pain management: pain level controlled Vital Signs Assessment: post-procedure vital signs reviewed and stable Respiratory status: spontaneous breathing, nonlabored ventilation, respiratory function stable and patient connected to nasal cannula oxygen Cardiovascular status: blood pressure returned to baseline and stable Postop Assessment: no apparent nausea or vomiting Anesthetic complications: no    Last Vitals:  Vitals:   01/08/18 1436 01/08/18 1500  BP: 121/89 123/83  Pulse: 75 69  Resp: 18 18  Temp: 36.5 C   SpO2: 99% 98%    Last Pain:  Vitals:   01/08/18 1436  TempSrc:   PainSc: 0-No pain                 Tammye Kahler

## 2018-01-08 NOTE — Anesthesia Preprocedure Evaluation (Signed)
Anesthesia Evaluation  Patient identified by MRN, date of birth, ID band Patient awake    Reviewed: Allergy & Precautions, NPO status , Patient's Chart, lab work & pertinent test results  History of Anesthesia Complications Negative for: history of anesthetic complications  Airway Mallampati: II  TM Distance: >3 FB Neck ROM: Full    Dental  (+) Partial Lower, Partial Upper, Poor Dentition, Chipped   Pulmonary sleep apnea ,    breath sounds clear to auscultation       Cardiovascular hypertension, Pt. on medications and Pt. on home beta blockers + CAD, + Past MI and + Cardiac Stents   Rhythm:Regular     Neuro/Psych  Headaches, negative psych ROS   GI/Hepatic Neg liver ROS, GERD  Medicated,Choledocholithiasis   Endo/Other  negative endocrine ROS  Renal/GU negative Renal ROS     Musculoskeletal  (+) Arthritis ,   Abdominal   Peds  Hematology   Anesthesia Other Findings   Reproductive/Obstetrics                             Anesthesia Physical Anesthesia Plan  ASA: III  Anesthesia Plan: General   Post-op Pain Management:    Induction: Intravenous  PONV Risk Score and Plan: 2 and Ondansetron and Dexamethasone  Airway Management Planned: Oral ETT  Additional Equipment: None  Intra-op Plan:   Post-operative Plan: Extubation in OR  Informed Consent: I have reviewed the patients History and Physical, chart, labs and discussed the procedure including the risks, benefits and alternatives for the proposed anesthesia with the patient or authorized representative who has indicated his/her understanding and acceptance.   Dental advisory given  Plan Discussed with: CRNA and Surgeon  Anesthesia Plan Comments:         Anesthesia Quick Evaluation

## 2018-01-08 NOTE — Transfer of Care (Signed)
Immediate Anesthesia Transfer of Care Note  Patient: Alexander [name]  Procedure(s) Performed: ENDOSCOPIC RETROGRADE CHOLANGIOPANCREATOGRAPHY (ERCP) (N/A ) SPHINCTEROTOMY BILIARY STENT PLACEMENT (plastic)  Patient Location: ICU  Anesthesia Type:General  Level of Consciousness: awake  Airway & Oxygen Therapy: Patient Spontanous Breathing and Patient connected to nasal cannula oxygen  Post-op Assessment: Report given to RN and Post -op Vital signs reviewed and stable  Post vital signs: Reviewed and stable  Last Vitals:  Vitals Value Taken Time  BP 115/83 01/08/2018  2:15 PM  Temp    Pulse 81 01/08/2018  2:22 PM  Resp 15 01/08/2018  2:22 PM  SpO2 100 % 01/08/2018  2:22 PM  Vitals shown include unvalidated device data.  Last Pain:  Vitals:   01/08/18 1137  TempSrc: Core (Comment)  PainSc: 0-No pain         Complications: No apparent anesthesia complications

## 2018-01-08 NOTE — Interval H&P Note (Signed)
History and Physical Interval Note:  01/08/2018 11:57 AM  Alexander [name]  has presented today for surgery, with the diagnosis of Choledocholithiasis  The various methods of treatment have been discussed with the patient and family. After consideration of risks, benefits and other options for treatment, the patient has consented to  Procedure(s): ENDOSCOPIC RETROGRADE CHOLANGIOPANCREATOGRAPHY (ERCP) (N/A) SPHINCTEROTOMY REMOVAL OF STONES BILIARY STENT PLACEMENT (plastic) as a surgical intervention .  [name] history has been reviewed, patient examined, no change in status, stable for surgery.  I have reviewed the patient's chart and labs.  Questions were answered to the patient's satisfaction.     Alexander [name]

## 2018-01-08 NOTE — Progress Notes (Signed)
   01/08/18 1800  Clinical Encounter Type  Visited With Patient;Patient and family together  Visit Type Initial  Referral From Nurse  Consult/Referral To Chaplain  Spiritual Encounters  Spiritual Needs Emotional  Stress Factors  Patient Stress Factors Exhausted   Pt was very sleepy when I visited due to medication after procedure. Pt's wife was on-site. Chaplain provideed the POA papers and explained to both. Pt's wife or Pt will call first thing on Mon for completion.  Kashus Karlen a Medical sales representative, [employer]

## 2018-01-08 NOTE — Anesthesia Procedure Notes (Signed)
Procedure Name: Intubation Date/Time: 01/08/2018 12:06 PM Performed by: Clearnce Sorrel, CRNA Pre-anesthesia Checklist: Patient identified, Emergency Drugs available, Suction available, Patient being monitored and Timeout performed Patient Re-evaluated:Patient Re-evaluated prior to induction Oxygen Delivery Method: Circle system utilized Preoxygenation: Pre-oxygenation with 100% oxygen Induction Type: IV induction Ventilation: Mask ventilation without difficulty Laryngoscope Size: Mac and 4 Grade View: Grade I Tube type: Oral Tube size: 7.5 mm Number of attempts: 1 Airway Equipment and Method: Stylet Placement Confirmation: ETT inserted through vocal cords under direct vision,  positive ETCO2 and breath sounds checked- equal and bilateral Secured at: 23 cm Tube secured with: Tape Dental Injury: Teeth and Oropharynx as per pre-operative assessment

## 2018-01-09 DIAGNOSIS — R7881 Bacteremia: Secondary | ICD-10-CM

## 2018-01-09 DIAGNOSIS — K8050 Calculus of bile duct without cholangitis or cholecystitis without obstruction: Secondary | ICD-10-CM

## 2018-01-09 LAB — BASIC METABOLIC PANEL
ANION GAP: 9 (ref 5–15)
BUN: 14 mg/dL (ref 8–23)
CHLORIDE: 107 mmol/L (ref 98–111)
CO2: 22 mmol/L (ref 22–32)
Calcium: 7.9 mg/dL — ABNORMAL LOW (ref 8.9–10.3)
Creatinine, Ser: 0.55 mg/dL — ABNORMAL LOW (ref 0.61–1.24)
GFR calc Af Amer: >60 mL/min (ref >60)
GLUCOSE: 93 mg/dL (ref 70–99)
POTASSIUM: 3.5 mmol/L (ref 3.5–5.1)
Sodium: 138 mmol/L (ref 135–145)

## 2018-01-09 LAB — CBC
HCT: 35.9 % — ABNORMAL LOW (ref 39.0–52.0)
Hemoglobin: 12.4 g/dL — ABNORMAL LOW (ref 13.0–17.0)
MCH: 32.1 pg (ref 26.0–34.0)
MCHC: 34.5 g/dL (ref 30.0–36.0)
MCV: 93 fL (ref 78.0–100.0)
PLATELETS: 100 10*3/uL — AB (ref 150–400)
RBC: 3.86 MIL/uL — AB (ref 4.22–5.81)
RDW: 13.7 % (ref 11.5–15.5)
WBC: 13.4 10*3/uL — ABNORMAL HIGH (ref 4.0–10.5)

## 2018-01-09 LAB — CULTURE, BLOOD (ROUTINE X 2): SPECIAL REQUESTS: ADEQUATE

## 2018-01-09 LAB — PHOSPHORUS: Phosphorus: 1.1 mg/dL — ABNORMAL LOW (ref 2.5–4.6)

## 2018-01-09 LAB — MAGNESIUM: Magnesium: 2.3 mg/dL (ref 1.7–2.4)

## 2018-01-09 MED ORDER — DEXTROSE 5 % IV SOLN
30.0000 mmol | Freq: Once | INTRAVENOUS | Status: AC
  Administered 2018-01-09: 30 mmol via INTRAVENOUS
  Filled 2018-01-09: qty 10

## 2018-01-09 MED ORDER — PANTOPRAZOLE SODIUM 40 MG PO TBEC
40.0000 mg | DELAYED_RELEASE_TABLET | Freq: Every day | ORAL | Status: DC
  Administered 2018-01-09 – 2018-01-11 (×3): 40 mg via ORAL
  Filled 2018-01-09 (×3): qty 1

## 2018-01-09 MED ORDER — VITAMIN D 1000 UNITS PO TABS
2000.0000 [IU] | ORAL_TABLET | Freq: Every day | ORAL | Status: DC
  Administered 2018-01-09 – 2018-01-11 (×3): 2000 [IU] via ORAL
  Filled 2018-01-09 (×3): qty 2

## 2018-01-09 MED ORDER — POTASSIUM & SODIUM PHOSPHATES 280-160-250 MG PO PACK
1.0000 | PACK | Freq: Three times a day (TID) | ORAL | Status: AC
  Administered 2018-01-09 (×3): 1 via ORAL
  Filled 2018-01-09 (×4): qty 1

## 2018-01-09 MED ORDER — METOPROLOL TARTRATE 12.5 MG HALF TABLET
12.5000 mg | ORAL_TABLET | Freq: Two times a day (BID) | ORAL | Status: DC
  Administered 2018-01-09 – 2018-01-11 (×3): 12.5 mg via ORAL
  Filled 2018-01-09 (×4): qty 1

## 2018-01-09 MED ORDER — METHOCARBAMOL 500 MG PO TABS
500.0000 mg | ORAL_TABLET | ORAL | Status: DC | PRN
  Administered 2018-01-09: 500 mg via ORAL
  Filled 2018-01-09: qty 1

## 2018-01-09 MED ORDER — ATORVASTATIN CALCIUM 40 MG PO TABS
40.0000 mg | ORAL_TABLET | Freq: Every day | ORAL | Status: DC
  Administered 2018-01-10: 40 mg via ORAL
  Filled 2018-01-09: qty 1

## 2018-01-09 MED ORDER — ASPIRIN 81 MG PO CHEW
81.0000 mg | CHEWABLE_TABLET | Freq: Every day | ORAL | Status: DC
  Administered 2018-01-09 – 2018-01-11 (×3): 81 mg via ORAL
  Filled 2018-01-09 (×3): qty 1

## 2018-01-09 MED ORDER — CEFTRIAXONE SODIUM 2 G IJ SOLR
2.0000 g | INTRAMUSCULAR | Status: DC
  Administered 2018-01-09 – 2018-01-10 (×2): 2 g via INTRAVENOUS
  Filled 2018-01-09 (×2): qty 20

## 2018-01-09 MED ORDER — CHOLECALCIFEROL 50 MCG (2000 UT) PO CAPS: 1.0000 | ORAL_CAPSULE | Freq: Every day | ORAL | Status: DC

## 2018-01-09 NOTE — Progress Notes (Signed)
Transfer note. Report received. Unit received from: 873M Telemetry: No telemetry. Med-surg. Skin: Assessed by 2 RNs. Sacral foam dressing for preventative. 3 small dressings to chest for skin tears related to ekg leads pulling skin.  VSS. Patient and wife oriented to 73M. Will continue to monitor.  Manya Silvas, RN

## 2018-01-09 NOTE — Progress Notes (Signed)
PULMONARY / CRITICAL CARE MEDICINE   Name: Alexander [name] MRN: [id number] DOB: 1951/02/03    ADMISSION DATE:  01/07/2018 CONSULTATION DATE:  8/9  REFERRING MD:  Alvino Chapel  CHIEF COMPLAINT:  abd pain, septic shock  HISTORY OF PRESENT ILLNESS:   This is a 67 year old white male with a prior history of hypertension, coronary artery disease, acute cholecystitis requiring cholecystectomy back in 2017 polio residual muscular atrophy as well as neuropathy.  Was in his usual state of health up until The evening of 8/8 when he began to complain of nausea, vomiting and right upper quadrant abdominal discomfort which radiated across the entire upper abdomen.  He said similar episodes like this in the past since his cholecystectomy however he rates typically subsided spontaneously.  He describes the pain is gnawing and burning.  There were no precipitating or relieving factors.  It was associated with some shortness of breath primarily due to pain preventing deep breathing.  He presented to the emergency room the a.m. of 8 9 as the pain was then rated at 10 out of 10.  Diagnostic evaluation in the emergency room known was was notable for temperature of 103.4, white blood cell count of 18.3, initial lactic acid was 4.12, he was tachycardic and hypotensive with systolic blood pressure in the low 80s.  He was given a total of 4 L of IV crystalloid for fluid resuscitation, IV Zosyn was initiated in the emergency room, and diagnostic imaging was obtained.  This demonstrated moderate common bile duct dilation measuring up to 17 mm.  There is also air within the distal common bile duct.  LFTs were elevated.  GI services were called for concern about acute cholangitis, critical care asked to admit for refractory hypotension, and septic shock.         SUBJECTIVE:  Continues to improve on 01/09/2018 he is ready for transfer to the floor and to the hospitalist service. VITAL SIGNS: BP 120/73   Pulse 66   Temp 98 F  (36.7 C) (Oral)   Resp 18   Ht 5\' 10"  (1.778 m)   Wt 96.7 kg   SpO2 97%   BMI 30.59 kg/m   HEMODYNAMICS:    VENTILATOR SETTINGS:    INTAKE / OUTPUT: I/O last 3 completed shifts: In: 5086.2 [I.V.:3952.4; IV Piggyback:1133.8] Out: 1725 [OEVOJ:5009]  PHYSICAL EXAMINATION: General: No acute distress on room air. HEENT: No JVD lymphadenopathy Neuro: Intact except for lower extremities with changes polio. CV: s1s2 rrr, no m/r/g PULM: even/non-labored, lungs bilaterally clear GI: Tender positive bowel sounds Extremities: warm/dry, negative edema  Skin: no rashes or lesions    LABS:  BMET Recent Labs  Lab 01/07/18 1402 01/08/18 0328 01/09/18 0423  NA 138 137 138  K 2.5* 3.2* 3.5  CL 109 109 107  CO2 17* 20* 22  BUN 13 14 14   CREATININE 0.83 0.72 0.55*  GLUCOSE 100* 100* 93    Electrolytes Recent Labs  Lab 01/07/18 1402 01/08/18 0328 01/09/18 0423  CALCIUM 8.0* 7.9* 7.9*  MG  --  1.6* 2.3  PHOS  --  2.9 1.1*    CBC Recent Labs  Lab 01/07/18 0754 01/08/18 0328  WBC 18.3* 23.5*  HGB 15.0 12.2*  HCT 43.7 35.6*  PLT 206 103*    Coag's Recent Labs  Lab 01/07/18 1401 01/08/18 0328  APTT 35  --   INR 1.31 1.53    Sepsis Markers Recent Labs  Lab 01/07/18 1003 01/07/18 1359 01/07/18 1401 01/08/18 0942  LATICACIDVEN 3.08*  2.2*  --  1.6  PROCALCITON  --   --  4.50  --     ABG No results for input(s): PHART, PCO2ART, PO2ART in the last 168 hours.  Liver Enzymes Recent Labs  Lab 01/07/18 0754 01/08/18 0942  AST 146* 50*  ALT 267* 137*  ALKPHOS 138* 112  BILITOT 6.1* 4.5*  ALBUMIN 4.0 2.8*    Cardiac Enzymes Recent Labs  Lab 01/07/18 1401  TROPONINI 0.06*    Glucose Recent Labs  Lab 01/07/18 1403  GLUCAP 101*    STUDIES:  CT abd 8/9: CT abdomen shows moderate common bile duct dilation measuring up to 17 mm.  He has had prior cholecystectomy.  There was air within the distal common bile duct, he has had prior  sphincterotomy. Note MRI canceled due to implanted pain delivery system CULTURES: Blood cultures 8/9>> E. coli>>> UC 8/9>>>  ANTIBIOTICS: Zosyn 8/9>>>  SIGNIFICANT EVENTS: 01/08/2018 ERCP  LINES/TUBES:   DISCUSSION: 67 year old white male presenting what is likely acute cholangitis and resultant septic shock.  He has received 4 L of crystalloid resuscitation remains slightly hypotensive.  He is received his antibiotics, cultures have been obtained.  We will get him to the intensive care, check for volume responsiveness with clear site device, he will likely need central access for vasoactive drips.  Gastroenterology has been consulted.  He is status post ERCP 01/08/2018.  He is noted to have sludge pus and stones in gallbladder.  Stent was placed on 01/08/2018.  He is ready to transfer to floor as of 01/09/2018.  ASSESSMENT / PLAN:  Septic shock.  Presumed source acute cholangitis -Has received 4 L crystalloid resuscitation.  Lactic acid is cleared approximately 25% thus far -Cultures obtained and antibiotics initiated -4 of 4 blood cultures positive for E. coli Plan Transfer to floor Day 3 of Zosyn and continue  History of coronary artery disease with prior bare-metal stents last placed in 2016 History of systolic cardiomyopathy with EF 40 to 45%.  Last echocardiogram 2016 History of hypertension History of paroxysmal atrial fibrillation Plan  Transfer to the floor Continue to hold antihypertensives for now  Lactic acidosis in setting of septic shock Resolved 01/09/2018 Plan Continue volume resuscitation, supportive care, repeat lactic acid.  Dyspnea, secondary to abdominal pain. At risk for atelectasis Plan O2 as needed Incentive spirometer  Fluid and electrolyte imbalance:  Hypokalemia Hypo-magnesium Hypocalcemia Plan Potassium magnesium have been repleted Calcium noted to be 7.9.   Elevated LFTs in the setting of acute cholangitis Plan Status post ER CP  01/08/2010 found to have sludge plus and stones.  Stent was placed.  Unable to remove the stones. GI is advancing his diet. GI is to follow Increase diet  History of sleep apnea, refused CPAP in past Plan Continue to monitor pulse oximetry  History of polio, with residual muscular atrophy and neuropathy Plan Resume Neurontin Note he has an implanted pain mediator left lower back  FAMILY  - Updates: 04/2018 wife and patient updated at length at bedside.  - Inter-disciplinary family meet or Palliative Care meeting due by:  8/15   DVT prophylaxis: [place] heparin SUP: H2B  Diet: Advance diet 01/09/2018 Activity: BR Disposition : ICU 01/09/2018 transfer to MedSurg floor Code status: full     Richardson Landry Edna Rede ACNP Maryanna Shape PCCM Pager [phone] till 1 pm If no answer page [phone] 01/09/2018, 8:33 AM

## 2018-01-09 NOTE — Plan of Care (Signed)

## 2018-01-09 NOTE — Progress Notes (Addendum)
[facility] Progress Note   Chief Complaint:  cbd stones, biliary obstruction with cholangitis    SUBJECTIVE:    some mild discomfort with eating but overall improved   ASSESSMENT AND PLAN:    67 yo male with multiple medical problems admitted with cholangitis / choledocholithiasis. Blood cx positive for E. Coli. He is s/p ERCP with limited sphincterotomy yesterday. Evidence of cholangitis with pus present. Multiple CBD stones seen. Plastic stent placed.  -Bilirubin down from 6 to 4.5 -Patient will need repeat ERCP with removal of stones and stent with Dr. Rush Landmark in ~ 6- 8 weeks.  -WBC 23 yesterday, will repeat CBC today. Bood Cx positive for E. Coli. Currently on Rocephin.    Attending physician's note   I have taken an interval history, reviewed the chart and examined the patient. I agree with the Advanced Practitioner's note, impression and recommendations. ERCP films.  67 year old with A. fib, CHF, sleep apnea, H/O cholecystostomy followed by cholecystectomy  2017 with ascending cholangitis, choledocholithiasis with septic shock. MRCP could not be performed due to lumbar spine stimulator. S/P ERCP with limited sphincterotomy with Endo biliary stent 01/08/2018 with copious drainage of pus. BC positive for E. Coli. On Rocephin.  Liver function tests trending down. Plan: Advance diet by tomorrow.  Continue antibiotics for 10 to 14 days. Trend LFTs. Rpt ERCP with removal of stones and stent with Dr Rush Landmark in 6-8 weeks.   Carmell Austria, MD   OBJECTIVE:     Vital signs in last 24 hours: Temp:  [97.7 F (36.5 C)-98 F (36.7 C)] 98 F (36.7 C) (08/11 1105) Pulse Rate:  [63-86] 63 (08/11 0900) Resp:  [12-28] 14 (08/11 0900) BP: (97-133)/(68-89) 118/77 (08/11 0900) SpO2:  [94 %-100 %] 99 % (08/11 0900) Weight:  [96.7 kg] 96.7 kg (08/11 0321) Last BM Date: 01/08/18 General:   Alert, well-developed, male in NAD EENT:  Normal hearing, non icteric sclera,  conjunctive pink.  Heart:  Regular rate and rhythm Pulm: Normal respiratory effort, lungs CTA bilaterally without wheezes or crackles. Abdomen:  Soft, nondistended, mild RUQ tenderness. Normal bowel sounds, no masses felt.     Neurologic:  Alert and  oriented x4;  grossly normal neurologically. Psych:  Pleasant, cooperative.  Normal mood and affect.   Intake/Output from previous day: 08/10 0701 - 08/11 0700 In: 3225.8 [I.V.:2535.2; IV Piggyback:690.6] Out: 1150 [Urine:1150] Intake/Output this shift: Total I/O In: 224.4 [I.V.:150; IV Piggyback:74.4] Out: 475 [Urine:475]  Lab Results: Recent Labs    01/07/18 0754 01/08/18 0328  WBC 18.3* 23.5*  HGB 15.0 12.2*  HCT 43.7 35.6*  PLT 206 103*   BMET Recent Labs    01/07/18 1402 01/08/18 0328 01/09/18 0423  NA 138 137 138  K 2.5* 3.2* 3.5  CL 109 109 107  CO2 17* 20* 22  GLUCOSE 100* 100* 93  BUN 13 14 14   CREATININE 0.83 0.72 0.55*  CALCIUM 8.0* 7.9* 7.9*   LFT Recent Labs    01/08/18 0942  PROT 5.8*  ALBUMIN 2.8*  AST 50*  ALT 137*  ALKPHOS 112  BILITOT 4.5*  BILIDIR 3.0*  IBILI 1.5*   PT/INR Recent Labs    01/07/18 1401 01/08/18 0328  LABPROT 16.1* 18.3*  INR 1.31 1.53   Hepatitis Panel No results for input(s): HEPBSAG, HCVAB, HEPAIGM, HEPBIGM in the last 72 hours.  Dg Ercp Biliary & Pancreatic Ducts  Result Date: 01/08/2018 CLINICAL DATA:  Choledocholithiasis. EXAM: ERCP TECHNIQUE: Multiple spot images obtained with the fluoroscopic  device and submitted for interpretation post-procedure. COMPARISON:  CT of the abdomen on 01/07/2018 FINDINGS: Imaging obtained with a C-arm during the ERCP procedure demonstrates cannulation of the common bile duct with contrast injection showing multiple irregular filling defects within a dilated common bile duct. Images were obtained during stone extraction and eventual placement of an endoscopic common bile duct stent. IMPRESSION: Filling defects in dilated common bile  duct consistent with choledocholithiasis. Stone extraction performed with placement of endoscopic common bile duct stent. These images were submitted for radiologic interpretation only. Please see the procedural report for the amount of contrast and the fluoroscopy time utilized. Electronically Signed   By: Aletta Edouard M.D.   On: 01/08/2018 14:51     Active Problems:   Septic shock ([place])     LOS: 2 days   Tye Savoy ,NP 01/09/2018, 2:05 PM

## 2018-01-10 ENCOUNTER — Telehealth: Payer: Self-pay

## 2018-01-10 DIAGNOSIS — K8033 Calculus of bile duct with acute cholangitis with obstruction: Secondary | ICD-10-CM

## 2018-01-10 DIAGNOSIS — K8021 Calculus of gallbladder without cholecystitis with obstruction: Secondary | ICD-10-CM

## 2018-01-10 LAB — COMPREHENSIVE METABOLIC PANEL
ALBUMIN: 2.7 g/dL — AB (ref 3.5–5.0)
ALK PHOS: 112 U/L (ref 38–126)
ALT: 70 U/L — ABNORMAL HIGH (ref 0–44)
ANION GAP: 9 (ref 5–15)
AST: 20 U/L (ref 15–41)
BUN: 8 mg/dL (ref 8–23)
CALCIUM: 8.1 mg/dL — AB (ref 8.9–10.3)
CO2: 23 mmol/L (ref 22–32)
Chloride: 107 mmol/L (ref 98–111)
Creatinine, Ser: 0.55 mg/dL — ABNORMAL LOW (ref 0.61–1.24)
GFR calc Af Amer: >60 mL/min (ref >60)
GLUCOSE: 96 mg/dL (ref 70–99)
POTASSIUM: 3.4 mmol/L — AB (ref 3.5–5.1)
Sodium: 139 mmol/L (ref 135–145)
TOTAL PROTEIN: 6 g/dL — AB (ref 6.5–8.1)
Total Bilirubin: 2.2 mg/dL — ABNORMAL HIGH (ref 0.3–1.2)

## 2018-01-10 LAB — CBC
HEMATOCRIT: 37.2 % — AB (ref 39.0–52.0)
HEMOGLOBIN: 12.9 g/dL — AB (ref 13.0–17.0)
MCH: 31.9 pg (ref 26.0–34.0)
MCHC: 34.7 g/dL (ref 30.0–36.0)
MCV: 91.9 fL (ref 78.0–100.0)
Platelets: 107 10*3/uL — ABNORMAL LOW (ref 150–400)
RBC: 4.05 MIL/uL — ABNORMAL LOW (ref 4.22–5.81)
RDW: 13.6 % (ref 11.5–15.5)
WBC: 11.6 10*3/uL — AB (ref 4.0–10.5)

## 2018-01-10 MED ORDER — METHOCARBAMOL 500 MG PO TABS
500.0000 mg | ORAL_TABLET | Freq: Four times a day (QID) | ORAL | Status: DC | PRN
  Filled 2018-01-10: qty 1

## 2018-01-10 MED ORDER — ENOXAPARIN SODIUM 40 MG/0.4ML ~~LOC~~ SOLN
40.0000 mg | SUBCUTANEOUS | Status: DC
Start: 2018-01-10 — End: 2018-01-11
  Administered 2018-01-10: 40 mg via SUBCUTANEOUS
  Filled 2018-01-10: qty 0.4

## 2018-01-10 MED ORDER — GABAPENTIN 600 MG PO TABS
1200.0000 mg | ORAL_TABLET | Freq: Three times a day (TID) | ORAL | Status: DC
  Administered 2018-01-10 – 2018-01-11 (×4): 1200 mg via ORAL
  Filled 2018-01-10 (×4): qty 2

## 2018-01-10 MED ORDER — CEFAZOLIN SODIUM-DEXTROSE 2-4 GM/100ML-% IV SOLN
2.0000 g | Freq: Three times a day (TID) | INTRAVENOUS | Status: DC
  Administered 2018-01-11: 2 g via INTRAVENOUS
  Filled 2018-01-10 (×2): qty 100

## 2018-01-10 NOTE — Progress Notes (Signed)
PROGRESS NOTE    Alexander [name]  QHU:[id number] DOB: [birth year] DOA: 01/07/2018 PCP: [name], MD  Brief Narrative: a 67 year old male with postpolio syndrome, admitted with acute cholangitis, complicated by septic shock and Escherichia coli bacteremia. Required pressor support, aggressive fluid resuscitation. -GI  was consulted he underwent ERCP with stent placement on 8/10 -Clinically improving transferred from ICU to [facility] service today 8/12   Assessment & Plan:   Active Problems:   Septic shock ([place]) -due to Escherichia coli bacteremia from acute cholangitis -Status post pressor support in ICU -Clinically improving, has been on IV ceftriaxone changed to IV Ancef today and by mouth antibiotics tomorrow if repeat blood cultures are negative   Acute cholangitis -As above -Status post ERCP and stent placement 8/10 -LFTs improving will repeat in a.m.  Postpolio syndrome/chronic pain -Resume home regimen of gabapentin  Hypertension -Lisinopril on hold  DVT prophylaxis:Add Lovenox Code Status: full code Family Communication:no family at bedside Disposition Plan: home later tomorrow if stable  Consultants:   gastroenterology   Procedures: ERCP and stent placement 8/10  Antimicrobials:  Antibiotics Given (last 72 hours)    Date/Time Action Medication Dose Rate   01/07/18 1544 New Bag/Given   piperacillin-tazobactam (ZOSYN) IVPB 3.375 g 3.375 g 12.5 mL/hr   01/07/18 2349 New Bag/Given   piperacillin-tazobactam (ZOSYN) IVPB 3.375 g 3.375 g 12.5 mL/hr   01/08/18 0823 New Bag/Given   piperacillin-tazobactam (ZOSYN) IVPB 3.375 g 3.375 g 12.5 mL/hr   01/08/18 1511 New Bag/Given   piperacillin-tazobactam (ZOSYN) IVPB 3.375 g 3.375 g 12.5 mL/hr   01/08/18 2346 New Bag/Given   piperacillin-tazobactam (ZOSYN) IVPB 3.375 g 3.375 g 12.5 mL/hr   01/09/18 0824 New Bag/Given   piperacillin-tazobactam (ZOSYN) IVPB 3.375 g 3.375 g 12.5 mL/hr   01/09/18 1041 New Bag/Given   cefTRIAXone (ROCEPHIN) 2 g in sodium chloride 0.9 % 100 mL IVPB 2 g 200 mL/hr   01/10/18 1135 New Bag/Given   cefTRIAXone (ROCEPHIN) 2 g in sodium chloride 0.9 % 100 mL IVPB 2 g 200 mL/hr      Subjective: -feels better, denies any abdominal pain nausea vomiting  Objective: Vitals:   01/09/18 1700 01/09/18 1724 01/10/18 0519 01/10/18 0813  BP: 126/83 (!) 136/92 138/86 136/86  Pulse:  74 64 61  Resp: (!) 24 (!) 22 16 18   Temp:  98.2 F (36.8 C) 99.2 F (37.3 C) 98.6 F (37 C)  TempSrc:  Oral Oral Oral  SpO2:  99% 96% 97%  Weight:      Height:        Intake/Output Summary (Last 24 hours) at 01/10/2018 1409 Last data filed at 01/10/2018 0602 Gross per 24 hour  Intake 404.49 ml  Output 1780 ml  Net -1375.51 ml   Filed Weights   01/07/18 1400 01/08/18 0500 01/09/18 0321  Weight: 96.7 kg 96.7 kg 96.7 kg    Examination:  General exam: Appears calm and comfortable  Respiratory system: Clear to auscultation. Respiratory effort normal. Cardiovascular system: S1 & S2 heard, RRR. No JVD, murmurs, rubs, gallops Gastrointestinal system: Abdomen is nondistended, soft and nontender.Normal bowel sounds heard. Central nervous system: Alert and oriented. No focal neurological deficits. Extremities: Symmetric 5 x 5 power. Skin: No rashes, lesions or ulcers Psychiatry: Judgement and insight appear normal. Mood & affect appropriate.     Data Reviewed:   CBC: Recent Labs  Lab 01/07/18 0754 01/08/18 0328 01/09/18 1423 01/10/18 0804  WBC 18.3* 23.5* 13.4* 11.6*  NEUTROABS 17.2*  --   --   --  HGB 15.0 12.2* 12.4* 12.9*  HCT 43.7 35.6* 35.9* 37.2*  MCV 93.0 93.7 93.0 91.9  PLT 206 103* 100* 789*   Basic Metabolic Panel: Recent Labs  Lab 01/07/18 0754 01/07/18 1402 01/08/18 0328 01/09/18 0423 01/10/18 0804  NA 136 138 137 138 139  K 3.3* 2.5* 3.2* 3.5 3.4*  CL 100 109 109 107 107  CO2 18* 17* 20* 22 23  GLUCOSE 116* 100* 100* 93 96  BUN 14 13 14 14 8   CREATININE 0.76  0.83 0.72 0.55* 0.55*  CALCIUM 9.7 8.0* 7.9* 7.9* 8.1*  MG  --   --  1.6* 2.3  --   PHOS  --   --  2.9 1.1*  --    GFR: Estimated Creatinine Clearance: 104.6 mL/min (A) (by C-G formula based on SCr of 0.55 mg/dL (L)). Liver Function Tests: Recent Labs  Lab 01/07/18 0754 01/08/18 0942 01/10/18 0804  AST 146* 50* 20  ALT 267* 137* 70*  ALKPHOS 138* 112 112  BILITOT 6.1* 4.5* 2.2*  PROT 7.5 5.8* 6.0*  ALBUMIN 4.0 2.8* 2.7*   Recent Labs  Lab 01/07/18 0754  LIPASE 34   No results for input(s): AMMONIA in the last 168 hours. Coagulation Profile: Recent Labs  Lab 01/07/18 1401 01/08/18 0328  INR 1.31 1.53   Cardiac Enzymes: Recent Labs  Lab 01/07/18 1401  TROPONINI 0.06*   BNP (last 3 results) No results for input(s): PROBNP in the last 8760 hours. HbA1C: No results for input(s): HGBA1C in the last 72 hours. CBG: Recent Labs  Lab 01/07/18 1403  GLUCAP 101*   Lipid Profile: No results for input(s): CHOL, HDL, LDLCALC, TRIG, CHOLHDL, LDLDIRECT in the last 72 hours. Thyroid Function Tests: No results for input(s): TSH, T4TOTAL, FREET4, T3FREE, THYROIDAB in the last 72 hours. Anemia Panel: No results for input(s): VITAMINB12, FOLATE, FERRITIN, TIBC, IRON, RETICCTPCT in the last 72 hours. Urine analysis:    Component Value Date/Time   COLORURINE AMBER (A) 01/07/2018 0949   APPEARANCEUR CLEAR 01/07/2018 0949   LABSPEC 1.017 01/07/2018 0949   PHURINE 7.0 01/07/2018 0949   GLUCOSEU NEGATIVE 01/07/2018 0949   HGBUR SMALL (A) 01/07/2018 0949   BILIRUBINUR SMALL (A) 01/07/2018 0949   KETONESUR 80 (A) 01/07/2018 0949   PROTEINUR 30 (A) 01/07/2018 0949   NITRITE NEGATIVE 01/07/2018 0949   LEUKOCYTESUR NEGATIVE 01/07/2018 0949   Sepsis Labs: @LABRCNTIP (procalcitonin:4,lacticidven:4)  ) Recent Results (from the past 240 hour(s))  Blood Culture (routine x 2)     Status: Abnormal   Collection Time: 01/07/18  9:26 AM  Result Value Ref Range Status   Specimen  Description BLOOD RIGHT ANTECUBITAL  Final   Special Requests   Final    BOTTLES DRAWN AEROBIC AND ANAEROBIC Blood Culture adequate volume   Culture  Setup Time   Final    GRAM NEGATIVE RODS CRITICAL RESULT CALLED TO, READ BACK BY AND VERIFIED WITH: C PIERCE PHARMD 01/07/18 2127 JDW IN BOTH AEROBIC AND ANAEROBIC BOTTLES Performed at [facility], [place] [address], [place], [place] [address]    Culture ESCHERICHIA COLI (A)  Final   Report Status 01/09/2018 FINAL  Final   Organism ID, Bacteria ESCHERICHIA COLI  Final      Susceptibility   Escherichia coli - MIC*    AMPICILLIN >=32 RESISTANT Resistant     CEFAZOLIN <=4 SENSITIVE Sensitive     CEFEPIME <=1 SENSITIVE Sensitive     CEFTAZIDIME <=1 SENSITIVE Sensitive     CEFTRIAXONE <=1  SENSITIVE Sensitive     CIPROFLOXACIN >=4 RESISTANT Resistant     GENTAMICIN >=16 RESISTANT Resistant     IMIPENEM <=0.25 SENSITIVE Sensitive     TRIMETH/SULFA <=20 SENSITIVE Sensitive     AMPICILLIN/SULBACTAM >=32 RESISTANT Resistant     PIP/TAZO <=4 SENSITIVE Sensitive     Extended ESBL NEGATIVE Sensitive     * ESCHERICHIA COLI  Blood Culture ID Panel (Reflexed)     Status: Abnormal   Collection Time: 01/07/18  9:26 AM  Result Value Ref Range Status   Enterococcus species NOT DETECTED NOT DETECTED Final   Listeria monocytogenes NOT DETECTED NOT DETECTED Final   Staphylococcus species NOT DETECTED NOT DETECTED Final   Staphylococcus aureus NOT DETECTED NOT DETECTED Final   Streptococcus species NOT DETECTED NOT DETECTED Final   Streptococcus agalactiae NOT DETECTED NOT DETECTED Final   Streptococcus pneumoniae NOT DETECTED NOT DETECTED Final   Streptococcus pyogenes NOT DETECTED NOT DETECTED Final   Acinetobacter baumannii NOT DETECTED NOT DETECTED Final   Enterobacteriaceae species DETECTED (A) NOT DETECTED Final    Comment: Enterobacteriaceae represent a large family of gram-negative bacteria, not a single organism. CRITICAL RESULT CALLED TO,  READ BACK BY AND VERIFIED WITH: C PIERCE PHARMD 01/07/18 2127 JDW    Enterobacter cloacae complex NOT DETECTED NOT DETECTED Final   Escherichia coli DETECTED (A) NOT DETECTED Final    Comment: CRITICAL RESULT CALLED TO, READ BACK BY AND VERIFIED WITH: C PIERCE PHARMD 01/07/18 2127 JDW    Klebsiella oxytoca NOT DETECTED NOT DETECTED Final   Klebsiella pneumoniae NOT DETECTED NOT DETECTED Final   Proteus species NOT DETECTED NOT DETECTED Final   Serratia marcescens NOT DETECTED NOT DETECTED Final   Carbapenem resistance NOT DETECTED NOT DETECTED Final   Haemophilus influenzae NOT DETECTED NOT DETECTED Final   Neisseria meningitidis NOT DETECTED NOT DETECTED Final   Pseudomonas aeruginosa NOT DETECTED NOT DETECTED Final   Candida albicans NOT DETECTED NOT DETECTED Final   Candida glabrata NOT DETECTED NOT DETECTED Final   Candida krusei NOT DETECTED NOT DETECTED Final   Candida parapsilosis NOT DETECTED NOT DETECTED Final   Candida tropicalis NOT DETECTED NOT DETECTED Final  Blood Culture (routine x 2)     Status: Abnormal   Collection Time: 01/07/18  9:31 AM  Result Value Ref Range Status   Specimen Description BLOOD LEFT HAND  Final   Special Requests   Final    BOTTLES DRAWN AEROBIC AND ANAEROBIC Blood Culture results may not be optimal due to an inadequate volume of blood received in culture bottles   Culture  Setup Time   Final    GRAM NEGATIVE RODS CRITICAL VALUE NOTED.  VALUE IS CONSISTENT WITH PREVIOUSLY REPORTED AND CALLED VALUE. IN BOTH AEROBIC AND ANAEROBIC BOTTLES    Culture (A)  Final    ESCHERICHIA COLI SUSCEPTIBILITIES PERFORMED ON PREVIOUS CULTURE WITHIN THE LAST 5 DAYS. Performed at [facility], [place] [address], [place], [place] [address]    Report Status 01/09/2018 FINAL  Final  MRSA PCR Screening     Status: None   Collection Time: 01/07/18  1:56 PM  Result Value Ref Range Status   MRSA by PCR NEGATIVE NEGATIVE Final    Comment:        The GeneXpert MRSA  Assay (FDA approved for NASAL specimens only), is one component of a comprehensive MRSA colonization surveillance program. It is not intended to diagnose MRSA infection nor to guide or monitor treatment for MRSA infections. Performed  at [facility], [place] [address] [place], [place] [address]          Radiology Studies: Dg Ercp Biliary & Pancreatic Ducts  Result Date: 01/08/2018 CLINICAL DATA:  Choledocholithiasis. EXAM: ERCP TECHNIQUE: Multiple spot images obtained with the fluoroscopic device and submitted for interpretation post-procedure. COMPARISON:  CT of the abdomen on 01/07/2018 FINDINGS: Imaging obtained with a C-arm during the ERCP procedure demonstrates cannulation of the common bile duct with contrast injection showing multiple irregular filling defects within a dilated common bile duct. Images were obtained during stone extraction and eventual placement of an endoscopic common bile duct stent. IMPRESSION: Filling defects in dilated common bile duct consistent with choledocholithiasis. Stone extraction performed with placement of endoscopic common bile duct stent. These images were submitted for radiologic interpretation only. Please see the procedural report for the amount of contrast and the fluoroscopy time utilized. Electronically Signed   By: Aletta Edouard M.D.   On: 01/08/2018 14:51        Scheduled Meds: . aspirin  81 mg Oral Daily  . atorvastatin  40 mg Oral q1800  . cholecalciferol  2,000 Units Oral Daily  . gabapentin  1,200 mg Oral TID  . metoprolol tartrate  12.5 mg Oral BID  . pantoprazole  40 mg Oral Daily   Continuous Infusions: . [START ON 01/11/2018]  ceFAZolin (ANCEF) IV       LOS: 3 days    Time spent: 29min    Domenic Polite, MD [facility] Page via [email or web address], password [email or web address] After 7PM please contact night-coverage  01/10/2018, 2:09 PM

## 2018-01-10 NOTE — Care Management Important Message (Signed)
Important Message  Patient Details  Name: Alexander [name] MRN: [id number] Date of Birth: 18-Feb-1951   Medicare Important Message Given:  Yes    [name] 01/10/2018, 4:13 PM

## 2018-01-10 NOTE — Progress Notes (Addendum)
Daily Rounding Note  01/10/2018, 2:47 PM  LOS: 3 days   SUBJECTIVE:   Chief complaint:     Tolerating solids.  Appetite not back to normal.  BM yesterday.  No nausea.  Last abdominal pain was 2 days ago, now feels a "cool" feeling in epigastrium, where it was burning before.    OBJECTIVE:         Vital signs in last 24 hours:    Temp:  [98.2 F (36.8 C)-99.2 F (37.3 C)] 98.6 F (37 C) (08/12 0813) Pulse Rate:  [61-74] 61 (08/12 0813) Resp:  [16-24] 18 (08/12 0813) BP: (126-138)/(77-92) 136/86 (08/12 0813) SpO2:  [96 %-99 %] 97 % (08/12 0813) Last BM Date: 01/08/18 Filed Weights   01/07/18 1400 01/08/18 0500 01/09/18 0321  Weight: 96.7 kg 96.7 kg 96.7 kg   General: obese, slightly jaundiced  Heart: RRR Chest: clear bil.  No labored breathing Abdomen: obese, soft, NT, active BS.  ND  Extremities: no CCE Neuro/Psych:  Appropriate.  Fully alert and oriented.  No gross weakness.    Intake/Output from previous day: 08/11 0701 - 08/12 0700 In: 1068.7 [P.O.:240; I.V.:150; IV Piggyback:678.7] Out: 2255 [Urine:2255]  Intake/Output this shift: No intake/output data recorded.  Lab Results: Recent Labs    01/08/18 0328 01/09/18 1423 01/10/18 0804  WBC 23.5* 13.4* 11.6*  HGB 12.2* 12.4* 12.9*  HCT 35.6* 35.9* 37.2*  PLT 103* 100* 107*   BMET Recent Labs    01/08/18 0328 01/09/18 0423 01/10/18 0804  NA 137 138 139  K 3.2* 3.5 3.4*  CL 109 107 107  CO2 20* 22 23  GLUCOSE 100* 93 96  BUN 14 14 8   CREATININE 0.72 0.55* 0.55*  CALCIUM 7.9* 7.9* 8.1*   LFT Recent Labs    01/08/18 0942 01/10/18 0804  PROT 5.8* 6.0*  ALBUMIN 2.8* 2.7*  AST 50* 20  ALT 137* 70*  ALKPHOS 112 112  BILITOT 4.5* 2.2*  BILIDIR 3.0*  --   IBILI 1.5*  --    PT/INR Recent Labs    01/08/18 0328  LABPROT 18.3*  INR 1.53   Hepatitis Panel No results for input(s): HEPBSAG, HCVAB, HEPAIGM, HEPBIGM in the last 72  hours.  Studies/Results: No results found.   Scheduled Meds: . aspirin  81 mg Oral Daily  . atorvastatin  40 mg Oral q1800  . cholecalciferol  2,000 Units Oral Daily  . enoxaparin (LOVENOX) injection  40 mg Subcutaneous Q24H  . gabapentin  1,200 mg Oral TID  . metoprolol tartrate  12.5 mg Oral BID  . pantoprazole  40 mg Oral Daily   Continuous Infusions: . [START ON 01/11/2018]  ceFAZolin (ANCEF) IV     PRN Meds:.methocarbamol   ASSESMENT:   *    Acute cholangitis with E. coli sepsis firing pressor support. S/P ERCP (Endoscopicretrogradecholangiopancreatography) noting multiple gallstones, sludge, pus.  Limited biliary sphincterotomy and placement of 8.5 Fr/5cm plastic biliary stent placement 01/08/18.  Duodenal stenosis noted at the time of ERCP (Endoscopicretrogradecholangiopancreatography). Previous cholecystectomy in 2017. Ancef in place, was on Rocephin as well.  Marland Kitchen LFTs, PRB improving.   PLAN   *   Plan repeat ERCP (Endoscopicretrogradecholangiopancreatography) with removal of stones and biliary stent by Dr. Rush Landmark in late September/early October 2019.    *  If continues to do well, could discharge tomorrow?  Will need to complete 10 to 14 d abx, Augmentin at discharge.    Alexander [name]  01/10/2018,  2:47 PM Phone [phone]     Attending physician's note   I have taken an interval history, reviewed the chart and examined the patient. I agree with the Advanced Practitioner's note, impression and recommendations.  Acute cholangitis with E. coli sepsis. ERCP with stent placed and CBD stones remain. He is substantially improved today. Pain improved, tolerating a light diet, WBC decreased to 11.6 and afebrile. Repeat ERCP in 4-6 weeks with Dr. Rush Landmark. Continue IV antibiotics while inpatient. 10 -14 day total course of antibiotics. We are available as needed. GI signing off.   Lucio Edward, MD FACG [phone] office

## 2018-01-10 NOTE — Telephone Encounter (Signed)
-----   Message from Irving Copas., MD sent at 01/10/2018  4:05 PM EDT ----- Regarding: Patient for follow up Dear Chong Sicilian, I hope that you are well. Please schedule this patient for an outpatient ERCP at [facility] or [facility] for CBD stent removal/stone extraction. Please schedule in 3-4 weeks at patient convenience. No need for clinic visit unless patient/family want it. Thanks.  Chester Holstein

## 2018-01-11 LAB — CBC
HEMATOCRIT: 40.2 % (ref 39.0–52.0)
HEMOGLOBIN: 13.7 g/dL (ref 13.0–17.0)
MCH: 31.3 pg (ref 26.0–34.0)
MCHC: 34.1 g/dL (ref 30.0–36.0)
MCV: 91.8 fL (ref 78.0–100.0)
Platelets: 132 10*3/uL — ABNORMAL LOW (ref 150–400)
RBC: 4.38 MIL/uL (ref 4.22–5.81)
RDW: 13.8 % (ref 11.5–15.5)
WBC: 13 10*3/uL — ABNORMAL HIGH (ref 4.0–10.5)

## 2018-01-11 LAB — COMPREHENSIVE METABOLIC PANEL
ALT: 57 U/L — ABNORMAL HIGH (ref 0–44)
ANION GAP: 10 (ref 5–15)
AST: 16 U/L (ref 15–41)
Albumin: 2.8 g/dL — ABNORMAL LOW (ref 3.5–5.0)
Alkaline Phosphatase: 114 U/L (ref 38–126)
BUN: 7 mg/dL — ABNORMAL LOW (ref 8–23)
CHLORIDE: 106 mmol/L (ref 98–111)
CO2: 23 mmol/L (ref 22–32)
Calcium: 8.6 mg/dL — ABNORMAL LOW (ref 8.9–10.3)
Creatinine, Ser: 0.56 mg/dL — ABNORMAL LOW (ref 0.61–1.24)
GFR calc non Af Amer: >60 mL/min (ref >60)
Glucose, Bld: 123 mg/dL — ABNORMAL HIGH (ref 70–99)
POTASSIUM: 3.3 mmol/L — AB (ref 3.5–5.1)
Sodium: 139 mmol/L (ref 135–145)
Total Bilirubin: 1.9 mg/dL — ABNORMAL HIGH (ref 0.3–1.2)
Total Protein: 6.5 g/dL (ref 6.5–8.1)

## 2018-01-11 LAB — GLUCOSE, CAPILLARY
Glucose-Capillary: 114 mg/dL — ABNORMAL HIGH (ref 70–99)
Glucose-Capillary: 129 mg/dL — ABNORMAL HIGH (ref 70–99)

## 2018-01-11 MED ORDER — POTASSIUM CHLORIDE CRYS ER 20 MEQ PO TBCR
40.0000 meq | EXTENDED_RELEASE_TABLET | Freq: Once | ORAL | Status: AC
  Administered 2018-01-11: 40 meq via ORAL
  Filled 2018-01-11: qty 2

## 2018-01-11 MED ORDER — CEPHALEXIN 500 MG PO CAPS: 500.0000 mg | ORAL_CAPSULE | Freq: Four times a day (QID) | ORAL | 0 refills | Status: AC

## 2018-01-11 NOTE — Progress Notes (Signed)
Patient discharged to home with wife. After visit Summary reviewed. Patient capable of reverbalizing medications and follow up visits. No signs and symptoms of distress noted. Patient educated to return to the ED in the case of an emergency. Rocco Kerkhoff RN 

## 2018-01-11 NOTE — Discharge Summary (Signed)
Physician Discharge Summary  Alexander [name] KGM:[id number] DOB: [birth year] DOA: 01/07/2018  PCP: Alexander Bush, MD  Admit date: 01/07/2018 Discharge date: 01/11/2018  Time spent: 35 minutes  Recommendations for Outpatient Follow-up:  PCP in one week Gastroenterology Dr. Rush Landmark in [place] for repeat ERCP  Discharge Diagnoses:  Active Problems:   Septic shock (HCC)   Ascending cholangitis   Calculus of bile duct with acute cholangitis with obstruction   Post polio syndrome   Escherichia coli bacteremia   Hypertension  Discharge Condition: improved  Diet recommendation: soft diet advanced as tolerated  Filed Weights   01/07/18 1400 01/08/18 0500 01/09/18 0321  Weight: 96.7 kg 96.7 kg 96.7 kg    History of present illness:  67 year old male with postpolio syndrome, admitted with acute cholangitis, complicated by septic shock  Hospital Course:   Septic shock ([place]) -due to Escherichia coli bacteremia from acute cholangitis -Status post pressor support in ICU -Clinically improving, has been on IV ceftriaxone changed to Po Keflex today -repeat blood cultures negative 1 day, he is afebrile, nontoxic, improved and tolerating diet at this time   Acute cholangitis -As above -Status post ERCP and stent placement 8/10, CBD stones remain -LFTs improving, repeat blood cultures negative -discharged home on oral Keflex based on Ecoli sensitivities -will need repeat ERCP in 4-6 weeks with Dr.Mansouraty  Postpolio syndrome/chronic pain -Resume home regimen of gabapentin  Hypertension -Lisinopril resumed   Discharge Exam: Vitals:   01/11/18 0541 01/11/18 0842  BP: 128/85 129/85  Pulse: 72 68  Resp: 20 20  Temp: 98.7 F (37.1 C) 98.3 F (36.8 C)  SpO2: 98% 98%    General: AAOx3 Cardiovascular: S1S2/RRR Respiratory: CTAB  Discharge Instructions   Discharge Instructions    Diet - low sodium heart healthy   Complete by:  As directed    Increase activity  slowly   Complete by:  As directed      Allergies as of 01/11/2018   No Known Allergies     Medication List    STOP taking these medications   ibuprofen 200 MG tablet Commonly known as:  ADVIL,MOTRIN     TAKE these medications   aspirin 81 MG chewable tablet Chew 1 tablet (81 mg total) by mouth daily.   aspirin-acetaminophen-caffeine 250-250-65 MG tablet Commonly known as:  EXCEDRIN MIGRAINE Take 1 tablet by mouth every 6 (six) hours as needed for headache. Takes 6 tablets daily   atorvastatin 80 MG tablet Commonly known as:  LIPITOR Take 0.5 tablets (40 mg total) daily at 6 PM by mouth. What changed:  when to take this   cephALEXin 500 MG capsule Commonly known as:  KEFLEX Take 1 capsule (500 mg total) by mouth 4 (four) times daily for 9 days.   Cholecalciferol 2000 units Caps Take 1 capsule (2,000 Units total) by mouth daily.   Fish Oil 1000 MG Caps Take 1 capsule by mouth daily.   gabapentin 600 MG tablet Commonly known as:  NEURONTIN TAKE 2 TABLETS 3 TIMES A DAY What changed:    how much to take  how to take this  when to take this  additional instructions   lisinopril 2.5 MG tablet Commonly known as:  PRINIVIL,ZESTRIL Take 1 tablet (2.5 mg total) by mouth daily.   Magnesium 250 MG Tabs Take 1 tablet (250 mg total) by mouth at bedtime.   methocarbamol 500 MG tablet Commonly known as:  ROBAXIN Take 500 mg by mouth as needed for muscle spasms.   metoprolol tartrate 25  MG tablet Commonly known as:  LOPRESSOR Take 12.5 mg by mouth 2 (two) times daily.   MULTIVITAMIN ADULTS 50+ PO Take 1 tablet by mouth daily.   omeprazole 40 MG capsule Commonly known as:  PRILOSEC Take 1 capsule (40 mg total) by mouth daily. What changed:  Another medication with the same name was removed. Continue taking this medication, and follow the directions you see here.   sildenafil 100 MG tablet Commonly known as:  VIAGRA TAKE ONE-HALF TO ONE TABLET BY MOUTH DAILY AS  NEEDED FOR ERECTILE DYSFUNCTION   vitamin C 500 MG tablet Commonly known as:  ASCORBIC ACID Take 1,000 mg by mouth daily.      No Known Allergies Follow-up Information    Mansouraty, Telford Nab., MD. Schedule an appointment as soon as possible for a visit in 4 weeks.   Specialties:  Gastroenterology, Internal Medicine Why:  Appointment date on 02/08/2018 at 10:00a Contact information: [place] [address] [phone]        Alexander Bush, MD. Schedule an appointment as soon as possible for a visit in 1 week.   Specialty:  Family Medicine Why:  Appointment date on 01/21/2018 10:30. Please arrive at 10:15a for necessary paperwork. Thank you.  Contact information: [place] [address] [phone]            The results of significant diagnostics from this hospitalization (including imaging, microbiology, ancillary and laboratory) are listed below for reference.    Significant Diagnostic Studies: Ct Abdomen Pelvis W Contrast  Result Date: 01/07/2018 CLINICAL DATA:  Epigastric pain for the past 2 days with nausea and vomiting. EXAM: CT ABDOMEN AND PELVIS WITH CONTRAST TECHNIQUE: Multidetector CT imaging of the abdomen and pelvis was performed using the standard protocol following bolus administration of intravenous contrast. CONTRAST:  112mL OMNIPAQUE IOHEXOL 300 MG/ML  SOLN COMPARISON:  CT abdomen pelvis dated May 15, 2015. FINDINGS: Lower chest: Slightly progressed bibasilar chronic interstitial lung disease. Hepatobiliary: No focal liver abnormality. Status post cholecystectomy. Mild intra and moderate extrahepatic biliary dilatation. Air within the distal common bile duct may be related to prior sphincterotomy. Pancreas: Unremarkable. No pancreatic ductal dilatation or surrounding inflammatory changes. Spleen: Normal in size without focal abnormality. Adrenals/Urinary Tract: Stable 1.9 cm left adrenal adenoma. The right adrenal  gland is unremarkable. No renal or ureteral calculi. Subcentimeter low-density lesion in the left kidney remains too small to characterize, but is unchanged. No hydronephrosis. The bladder is unremarkable. Stomach/Bowel: Stomach is within normal limits. Appendix appears normal. No evidence of bowel wall thickening, distention, or inflammatory changes. Mild left-sided colonic diverticulosis. Vascular/Lymphatic: Aortic atherosclerosis. No enlarged abdominal or pelvic lymph nodes. Reproductive: Mild prostatomegaly. Other: No abdominal wall hernia or abnormality. No abdominopelvic ascites. No pneumoperitoneum. Musculoskeletal: No acute or significant osseous findings. Unchanged grade 1 anterolisthesis at L5-S1 due to bilateral pars defects. IMPRESSION: 1. Moderate common bile duct dilatation, measuring up to 17 mm. While this may be related to post cholecystectomy state, correlation with LFTs is recommended. If abnormal, recommend ERCP or MRCP for further evaluation. 2. Air within the distal common bile duct may be related to prior sphincterotomy. 3. Slightly progressed chronic interstitial lung disease. 4.  Aortic atherosclerosis (ICD10-I70.0). Electronically Signed   By: Titus Dubin M.D.   On: 01/07/2018 11:40   Dg Ercp Biliary & Pancreatic Ducts  Result Date: 01/08/2018 CLINICAL DATA:  Choledocholithiasis. EXAM: ERCP TECHNIQUE: Multiple spot images obtained with the fluoroscopic device and submitted for interpretation post-procedure.  COMPARISON:  CT of the abdomen on 01/07/2018 FINDINGS: Imaging obtained with a C-arm during the ERCP procedure demonstrates cannulation of the common bile duct with contrast injection showing multiple irregular filling defects within a dilated common bile duct. Images were obtained during stone extraction and eventual placement of an endoscopic common bile duct stent. IMPRESSION: Filling defects in dilated common bile duct consistent with choledocholithiasis. Stone extraction  performed with placement of endoscopic common bile duct stent. These images were submitted for radiologic interpretation only. Please see the procedural report for the amount of contrast and the fluoroscopy time utilized. Electronically Signed   By: Aletta Edouard M.D.   On: 01/08/2018 14:51   Dg Abdomen Acute W/chest  Result Date: 01/07/2018 CLINICAL DATA:  epigastric abdominal pain that started yesterday. Pt has had nausea and vomitng since 0630 yesterday and has been weak ever since. Pt is currently having sob EXAM: DG ABDOMEN ACUTE W/ 1V CHEST COMPARISON:  08/01/2015 and 05/15/2015 FINDINGS: Cervical and thoracic stimulator leads noted. Low lung volumes are present, causing crowding of the pulmonary vasculature. Mildly elevated right hemidiaphragm. Chronic mild interstitial accentuation in both lungs. Heart size within normal limits given the low lung volumes. Thoracolumbar spondylosis. Unremarkable bowel gas pattern. No free intraperitoneal gas. No significant abnormal calcifications are observed in the abdomen. No significant abnormal air-fluid levels in bowel. IMPRESSION: 1. A specific cause for the patient's epigastric abdominal pain is not identified. Bowel gas pattern normal. 2. Low lung volumes are present, causing crowding of the pulmonary vasculature. 3. Chronic interstitial accentuation in the lungs. Electronically Signed   By: Van Clines M.D.   On: 01/07/2018 08:04    Microbiology: Recent Results (from the past 240 hour(s))  Blood Culture (routine x 2)     Status: Abnormal   Collection Time: 01/07/18  9:26 AM  Result Value Ref Range Status   Specimen Description BLOOD RIGHT ANTECUBITAL  Final   Special Requests   Final    BOTTLES DRAWN AEROBIC AND ANAEROBIC Blood Culture adequate volume   Culture  Setup Time   Final    GRAM NEGATIVE RODS CRITICAL RESULT CALLED TO, READ BACK BY AND VERIFIED WITH: C PIERCE PHARMD 01/07/18 2127 JDW IN BOTH AEROBIC AND ANAEROBIC BOTTLES Performed  at [facility], [place] [address], [place], [place] [address]    Culture ESCHERICHIA COLI (A)  Final   Report Status 01/09/2018 FINAL  Final   Organism ID, Bacteria ESCHERICHIA COLI  Final      Susceptibility   Escherichia coli - MIC*    AMPICILLIN >=32 RESISTANT Resistant     CEFAZOLIN <=4 SENSITIVE Sensitive     CEFEPIME <=1 SENSITIVE Sensitive     CEFTAZIDIME <=1 SENSITIVE Sensitive     CEFTRIAXONE <=1 SENSITIVE Sensitive     CIPROFLOXACIN >=4 RESISTANT Resistant     GENTAMICIN >=16 RESISTANT Resistant     IMIPENEM <=0.25 SENSITIVE Sensitive     TRIMETH/SULFA <=20 SENSITIVE Sensitive     AMPICILLIN/SULBACTAM >=32 RESISTANT Resistant     PIP/TAZO <=4 SENSITIVE Sensitive     Extended ESBL NEGATIVE Sensitive     * ESCHERICHIA COLI  Blood Culture ID Panel (Reflexed)     Status: Abnormal   Collection Time: 01/07/18  9:26 AM  Result Value Ref Range Status   Enterococcus species NOT DETECTED NOT DETECTED Final   Listeria monocytogenes NOT DETECTED NOT DETECTED Final   Staphylococcus species NOT DETECTED NOT DETECTED Final   Staphylococcus aureus NOT DETECTED NOT DETECTED Final  Streptococcus species NOT DETECTED NOT DETECTED Final   Streptococcus agalactiae NOT DETECTED NOT DETECTED Final   Streptococcus pneumoniae NOT DETECTED NOT DETECTED Final   Streptococcus pyogenes NOT DETECTED NOT DETECTED Final   Acinetobacter baumannii NOT DETECTED NOT DETECTED Final   Enterobacteriaceae species DETECTED (A) NOT DETECTED Final    Comment: Enterobacteriaceae represent a large family of gram-negative bacteria, not a single organism. CRITICAL RESULT CALLED TO, READ BACK BY AND VERIFIED WITH: C PIERCE PHARMD 01/07/18 2127 JDW    Enterobacter cloacae complex NOT DETECTED NOT DETECTED Final   Escherichia coli DETECTED (A) NOT DETECTED Final    Comment: CRITICAL RESULT CALLED TO, READ BACK BY AND VERIFIED WITH: C PIERCE PHARMD 01/07/18 2127 JDW    Klebsiella oxytoca NOT DETECTED NOT DETECTED  Final   Klebsiella pneumoniae NOT DETECTED NOT DETECTED Final   Proteus species NOT DETECTED NOT DETECTED Final   Serratia marcescens NOT DETECTED NOT DETECTED Final   Carbapenem resistance NOT DETECTED NOT DETECTED Final   Haemophilus influenzae NOT DETECTED NOT DETECTED Final   Neisseria meningitidis NOT DETECTED NOT DETECTED Final   Pseudomonas aeruginosa NOT DETECTED NOT DETECTED Final   Candida albicans NOT DETECTED NOT DETECTED Final   Candida glabrata NOT DETECTED NOT DETECTED Final   Candida krusei NOT DETECTED NOT DETECTED Final   Candida parapsilosis NOT DETECTED NOT DETECTED Final   Candida tropicalis NOT DETECTED NOT DETECTED Final  Blood Culture (routine x 2)     Status: Abnormal   Collection Time: 01/07/18  9:31 AM  Result Value Ref Range Status   Specimen Description BLOOD LEFT HAND  Final   Special Requests   Final    BOTTLES DRAWN AEROBIC AND ANAEROBIC Blood Culture results may not be optimal due to an inadequate volume of blood received in culture bottles   Culture  Setup Time   Final    GRAM NEGATIVE RODS CRITICAL VALUE NOTED.  VALUE IS CONSISTENT WITH PREVIOUSLY REPORTED AND CALLED VALUE. IN BOTH AEROBIC AND ANAEROBIC BOTTLES    Culture (A)  Final    ESCHERICHIA COLI SUSCEPTIBILITIES PERFORMED ON PREVIOUS CULTURE WITHIN THE LAST 5 DAYS. Performed at [facility], [place] [address], [place], [place] [address]    Report Status 01/09/2018 FINAL  Final  MRSA PCR Screening     Status: None   Collection Time: 01/07/18  1:56 PM  Result Value Ref Range Status   MRSA by PCR NEGATIVE NEGATIVE Final    Comment:        The GeneXpert MRSA Assay (FDA approved for NASAL specimens only), is one component of a comprehensive MRSA colonization surveillance program. It is not intended to diagnose MRSA infection nor to guide or monitor treatment for MRSA infections. Performed at [facility], [place] [address], [place], [place] [address]   Culture, blood (routine x  2)     Status: None (Preliminary result)   Collection Time: 01/10/18  8:05 AM  Result Value Ref Range Status   Specimen Description BLOOD LEFT ANTECUBITAL  Final   Special Requests   Final    BOTTLES DRAWN AEROBIC ONLY Blood Culture adequate volume   Culture   Final    NO GROWTH 1 DAY Performed at [facility], [place] [address], [place], [place] [address]    Report Status PENDING  Incomplete  Culture, blood (routine x 2)     Status: None (Preliminary result)   Collection Time: 01/10/18  8:11 AM  Result Value Ref Range  Status   Specimen Description BLOOD RIGHT ANTECUBITAL  Final   Special Requests   Final    BOTTLES DRAWN AEROBIC ONLY Blood Culture adequate volume   Culture   Final    NO GROWTH 1 DAY Performed at [facility], [address] [place], [place] [address]    Report Status PENDING  Incomplete     Labs: Basic Metabolic Panel: Recent Labs  Lab 01/07/18 1402 01/08/18 0328 01/09/18 0423 01/10/18 0804 01/11/18 0615  NA 138 137 138 139 139  K 2.5* 3.2* 3.5 3.4* 3.3*  CL 109 109 107 107 106  CO2 17* 20* 22 23 23   GLUCOSE 100* 100* 93 96 123*  BUN 13 14 14 8  7*  CREATININE 0.83 0.72 0.55* 0.55* 0.56*  CALCIUM 8.0* 7.9* 7.9* 8.1* 8.6*  MG  --  1.6* 2.3  --   --   PHOS  --  2.9 1.1*  --   --    Liver Function Tests: Recent Labs  Lab 01/07/18 0754 01/08/18 0942 01/10/18 0804 01/11/18 0615  AST 146* 50* 20 16  ALT 267* 137* 70* 57*  ALKPHOS 138* 112 112 114  BILITOT 6.1* 4.5* 2.2* 1.9*  PROT 7.5 5.8* 6.0* 6.5  ALBUMIN 4.0 2.8* 2.7* 2.8*   Recent Labs  Lab 01/07/18 0754  LIPASE 34   No results for input(s): AMMONIA in the last 168 hours. CBC: Recent Labs  Lab 01/07/18 0754 01/08/18 0328 01/09/18 1423 01/10/18 0804 01/11/18 0615  WBC 18.3* 23.5* 13.4* 11.6* 13.0*  NEUTROABS 17.2*  --   --   --   --   HGB 15.0 12.2* 12.4* 12.9* 13.7  HCT 43.7 35.6* 35.9* 37.2* 40.2  MCV 93.0 93.7 93.0 91.9 91.8  PLT 206 103* 100* 107* 132*    Cardiac Enzymes: Recent Labs  Lab 01/07/18 1401  TROPONINI 0.06*   BNP: BNP (last 3 results) No results for input(s): BNP in the last 8760 hours.  ProBNP (last 3 results) No results for input(s): PROBNP in the last 8760 hours.  CBG: Recent Labs  Lab 01/07/18 1403 01/11/18 0743 01/11/18 1228  GLUCAP 101* 114* 129*       Signed:  Domenic Polite MD.  [facility] 01/11/2018, 3:01 PM

## 2018-01-12 ENCOUNTER — Telehealth: Payer: Self-pay | Admitting: *Deleted

## 2018-01-12 ENCOUNTER — Other Ambulatory Visit: Payer: Self-pay

## 2018-01-12 DIAGNOSIS — K8033 Calculus of bile duct with acute cholangitis with obstruction: Secondary | ICD-10-CM

## 2018-01-12 DIAGNOSIS — K8309 Other cholangitis: Secondary | ICD-10-CM

## 2018-01-12 NOTE — Telephone Encounter (Signed)
Lm requesting return call to complete TCM and confirm hosp f/u appt  

## 2018-01-12 NOTE — Telephone Encounter (Signed)
Left message on machine to call back  

## 2018-01-12 NOTE — Telephone Encounter (Signed)
EUS scheduled, pt instructed and medications reviewed.  Patient instructions mailed to home.  Patient to call with any questions or concerns.  

## 2018-01-12 NOTE — Consult Note (Signed)
            [facility] CM Primary Care Navigator  01/12/2018  Alexander [name] 11-10-1950 [id number]   Went to see patient at the bedside to identify possible discharge needs buthe wasdischarged homeper staff.  Per MD note, patient was admitted with acute cholangitis, complicated by septic shock.  Patient has discharge instruction to follow-up with primary care provider in 1 week and gastroenterology follow-up in 4 weeks.  Primary care provider's office is listed as providing transition of care (TOC) follow-up.   For additional questions please contact:  Edwena Felty A. Kyria Bumgardner, BSN, RN-BC [facility] PRIMARY CARE Navigator Cell: [phone]

## 2018-01-12 NOTE — Telephone Encounter (Signed)
ERCP 02/09/18 [facility] 730 am

## 2018-01-13 NOTE — Telephone Encounter (Signed)
Transition Care Management Follow-up Telephone Call  Date discharged? 08/13/20192   How have you been since you were released from the hospital? "tired and exhausted"   Do you understand why you were in the hospital? yes   Do you understand the discharge instructions? yes   Where were you discharged to? home   Items Reviewed:  Medications reviewed: yes  Allergies reviewed: yes  Dietary changes reviewed: yes  Referrals reviewed: yes   Functional Questionnaire:   Activities of Daily Living (ADLs):   He states they are independent in the following: independent in all areas, but wife is available to help if needed    Any transportation issues/concerns?: no   Any patient concerns? no   Confirmed importance and date/time of follow-up visits scheduled yes  Provider Appointment booked with 8/23 with Dr Darnell Level  Confirmed with patient if condition begins to worsen call PCP or go to the ER.  Patient was given the office number and encouraged to call back with question or concerns.  : yes

## 2018-01-15 LAB — CULTURE, BLOOD (ROUTINE X 2)
Culture: NO GROWTH
Culture: NO GROWTH
SPECIAL REQUESTS: ADEQUATE
SPECIAL REQUESTS: ADEQUATE

## 2018-01-21 ENCOUNTER — Encounter: Payer: Self-pay | Admitting: Family Medicine

## 2018-01-21 ENCOUNTER — Ambulatory Visit (INDEPENDENT_AMBULATORY_CARE_PROVIDER_SITE_OTHER)
Admission: RE | Admit: 2018-01-21 | Discharge: 2018-01-21 | Disposition: A | Discharge status: Home / Self Care | Payer: Medicare Other | Source: Ambulatory Visit | Attending: Family Medicine | Admitting: Family Medicine

## 2018-01-21 ENCOUNTER — Ambulatory Visit (INDEPENDENT_AMBULATORY_CARE_PROVIDER_SITE_OTHER): Payer: Medicare Other | Admitting: Family Medicine

## 2018-01-21 VITALS — BP 120/70 | HR 74 | Temp 98.2°F | Ht 70.0 in | Wt 211.2 lb

## 2018-01-21 DIAGNOSIS — J849 Interstitial pulmonary disease, unspecified: Secondary | ICD-10-CM

## 2018-01-21 DIAGNOSIS — A419 Sepsis, unspecified organism: Secondary | ICD-10-CM

## 2018-01-21 DIAGNOSIS — K8309 Other cholangitis: Secondary | ICD-10-CM

## 2018-01-21 DIAGNOSIS — Z8619 Personal history of other infectious and parasitic diseases: Secondary | ICD-10-CM

## 2018-01-21 DIAGNOSIS — E876 Hypokalemia: Secondary | ICD-10-CM

## 2018-01-21 DIAGNOSIS — K8033 Calculus of bile duct with acute cholangitis with obstruction: Secondary | ICD-10-CM

## 2018-01-21 DIAGNOSIS — R6521 Severe sepsis with septic shock: Secondary | ICD-10-CM

## 2018-01-21 DIAGNOSIS — R5383 Other fatigue: Secondary | ICD-10-CM | POA: Diagnosis not present

## 2018-01-21 LAB — COMPREHENSIVE METABOLIC PANEL
ALK PHOS: 123 U/L — AB (ref 39–117)
ALT: 28 U/L (ref 0–53)
AST: 18 U/L (ref 0–37)
Albumin: 4 g/dL (ref 3.5–5.2)
BILIRUBIN TOTAL: 1.1 mg/dL (ref 0.2–1.2)
BUN: 15 mg/dL (ref 6–23)
CO2: 31 meq/L (ref 19–32)
Calcium: 9.7 mg/dL (ref 8.4–10.5)
Chloride: 99 mEq/L (ref 96–112)
Creatinine, Ser: 0.62 mg/dL (ref 0.40–1.50)
GFR: 137.16 mL/min (ref >60.00)
GLUCOSE: 104 mg/dL — AB (ref 70–99)
Potassium: 4.6 mEq/L (ref 3.5–5.1)
SODIUM: 136 meq/L (ref 135–145)
TOTAL PROTEIN: 8.2 g/dL (ref 6.0–8.3)

## 2018-01-21 LAB — CBC WITH DIFFERENTIAL/PLATELET
BASOS ABS: 0.1 10*3/uL (ref 0.0–0.1)
BASOS PCT: 1 % (ref 0.0–3.0)
Eosinophils Absolute: 0.2 10*3/uL (ref 0.0–0.7)
Eosinophils Relative: 1.9 % (ref 0.0–5.0)
HCT: 40.7 % (ref 39.0–52.0)
Hemoglobin: 13.9 g/dL (ref 13.0–17.0)
LYMPHS ABS: 2.7 10*3/uL (ref 0.7–4.0)
Lymphocytes Relative: 32.1 % (ref 12.0–46.0)
MCHC: 34.1 g/dL (ref 30.0–36.0)
MCV: 94.3 fl (ref 78.0–100.0)
Monocytes Absolute: 0.6 10*3/uL (ref 0.1–1.0)
Monocytes Relative: 7.6 % (ref 3.0–12.0)
NEUTROS ABS: 4.9 10*3/uL (ref 1.4–7.7)
NEUTROS PCT: 57.4 % (ref 43.0–77.0)
PLATELETS: 537 10*3/uL — AB (ref 150.0–400.0)
RBC: 4.32 Mil/uL (ref 4.22–5.81)
RDW: 13.8 % (ref 11.5–15.5)
WBC: 8.4 10*3/uL (ref 4.0–10.5)

## 2018-01-21 LAB — MAGNESIUM: Magnesium: 2.4 mg/dL (ref 1.5–2.5)

## 2018-01-21 NOTE — Progress Notes (Signed)
BP 120/70 (BP Location: Left Arm, Patient Position: Sitting, Cuff Size: Normal)   Pulse 74   Temp 98.2 F (36.8 C) (Oral)   Ht 5\' 10"  (1.778 m)   Wt 211 lb 4 oz (95.8 kg)   SpO2 96%   BMI 30.31 kg/m    CC: hosp f/u visit Subjective:    Patient ID: Alexander [name], male    DOB: [birth year], 67 y.o.   MRN: [id number]  HPI: Alexander [name] is a 67 y.o. male presenting on 01/21/2018 for Hospitalization Follow-up (Admitted to [facility] on 01/07/18, primary dx Septic shock.)   Recent hospitalization for acute cholangitis and E coli bacteremia with septic shock. Treated with IV rocephin transitioned to PO keflex. He had ERCP and stent placement 8/10, CBD stones remain. Planning rpt ERCP with GI Dr Rush Landmark in 4-6 wks.   Since home, he has been very tired and fatigued. Stayed in be for first week after hospitalization, but in the last 3 days noticing slowed energy improvement. No fevers, abd pain, nausea/vomiting since home.   Increased urinary frequency - he has increased water intake. Minimal dysuria.   Admit date: 01/07/2018 Discharge date: 01/11/2018 TCM phone call completed 01/13/2018  Recommendations for Outpatient Follow-up:  PCP in one week Gastroenterology Dr. Rush Landmark in [place] for repeat ERCP  Discharge Diagnoses:  Active Problems:   Septic shock (HCC)   Ascending cholangitis   Calculus of bile duct with acute cholangitis with obstruction   Post polio syndrome   Escherichia coli bacteremia   Hypertension  Discharge Condition: improved Diet recommendation: soft diet advanced as tolerated  Relevant past medical, surgical, family and social history reviewed and updated as indicated. Interim medical history since our last visit reviewed. Allergies and medications reviewed and updated. Discharge medications reconciled with current medication list.  Outpatient Medications Prior to Visit  Medication Sig Dispense Refill  . atorvastatin (LIPITOR) 80 MG tablet Take 0.5 tablets (40  mg total) daily at 6 PM by mouth. (Patient taking differently: Take 40 mg by mouth every other day. ) 90 tablet 3  . Cholecalciferol ([employer] VITAMIN D) 2000 UNITS CAPS Take 1 capsule (2,000 Units total) by mouth daily.    Marland Kitchen gabapentin (NEURONTIN) 600 MG tablet TAKE 2 TABLETS 3 TIMES A DAY (Patient taking differently: Take 1,200 mg by mouth 3 (three) times daily. ) 540 tablet 3  . lisinopril (PRINIVIL,ZESTRIL) 2.5 MG tablet Take 1 tablet (2.5 mg total) by mouth daily. 90 tablet 0  . Magnesium 250 MG TABS Take 1 tablet (250 mg total) by mouth at bedtime.  0  . Multiple Vitamins-Minerals (MULTIVITAMIN ADULTS 50+ PO) Take 1 tablet by mouth daily.    . Omega-3 Fatty Acids (FISH OIL) 1000 MG CAPS Take 1 capsule by mouth daily.    Marland Kitchen omeprazole (PRILOSEC) 40 MG capsule Take 1 capsule (40 mg total) by mouth daily. 90 capsule 0  . sildenafil (VIAGRA) 100 MG tablet TAKE ONE-HALF TO ONE TABLET BY MOUTH DAILY AS NEEDED FOR ERECTILE DYSFUNCTION 6 tablet 0  . vitamin C (ASCORBIC ACID) 500 MG tablet Take 1,000 mg by mouth daily.    Marland Kitchen aspirin 81 MG chewable tablet Chew 1 tablet (81 mg total) by mouth daily. (Patient not taking: Reported on 01/21/2018) 30 tablet 0  . aspirin-acetaminophen-caffeine (EXCEDRIN MIGRAINE) 250-250-65 MG tablet Take 1 tablet by mouth every 6 (six) hours as needed for headache. Takes 6 tablets daily (Patient not taking: Reported on 01/21/2018)  0  . methocarbamol (ROBAXIN) 500 MG tablet Take  500 mg by mouth as needed for muscle spasms.    . metoprolol tartrate (LOPRESSOR) 25 MG tablet Take 12.5 mg by mouth 2 (two) times daily.     No facility-administered medications prior to visit.      Per HPI unless specifically indicated in ROS section below Review of Systems     Objective:    BP 120/70 (BP Location: Left Arm, Patient Position: Sitting, Cuff Size: Normal)   Pulse 74   Temp 98.2 F (36.8 C) (Oral)   Ht 5\' 10"  (1.778 m)   Wt 211 lb 4 oz (95.8 kg)   SpO2 96%   BMI 30.31 kg/m   Wt  Readings from Last 3 Encounters:  01/21/18 211 lb 4 oz (95.8 kg)  01/09/18 213 lb 3 oz (96.7 kg)  11/11/17 225 lb (102.1 kg)    Physical Exam  Constitutional: He appears well-developed and well-nourished. No distress.  HENT:  Mouth/Throat: Oropharynx is clear and moist. No oropharyngeal exudate.  Eyes: Pupils are equal, round, and reactive to light. EOM are normal.  Cardiovascular: Normal rate, regular rhythm and normal heart sounds.  No murmur heard. Pulmonary/Chest: Effort normal and breath sounds normal. No respiratory distress. He has no wheezes. He has no rales.  Abdominal: Soft. Bowel sounds are normal. He exhibits no distension and no mass. There is no tenderness. There is no rebound and no guarding. No hernia.  Musculoskeletal: He exhibits no edema.  Neurological:  Chronic LE weakness  Uses forearm crutches  Skin: Skin is warm and dry. No rash noted.  Nursing note and vitals reviewed.   Lab Results  Component Value Date   CREATININE 0.62 01/21/2018   BUN 15 01/21/2018   NA 136 01/21/2018   K 4.6 01/21/2018   CL 99 01/21/2018   CO2 31 01/21/2018    Lab Results  Component Value Date   WBC 8.4 01/21/2018   HGB 13.9 01/21/2018   HCT 40.7 01/21/2018   MCV 94.3 01/21/2018   PLT 537.0 (H) 01/21/2018    Lab Results  Component Value Date   ALT 28 01/21/2018   AST 18 01/21/2018   ALKPHOS 123 (H) 01/21/2018   BILITOT 1.1 01/21/2018    DG Chest 2 View CLINICAL DATA:  Follow-up interstitial change  EXAM: CHEST - 2 VIEW  COMPARISON:  01/07/2018  FINDINGS: Cardiac shadow is stable. The lungs are slightly improved with aeration. Mild persistent interstitial changes are noted chronic in nature. No focal infiltrate is seen. Spinal stimulators are again noted. No acute bony abnormality is seen.  IMPRESSION: Chronic changes without acute abnormality.  Electronically Signed   By: Inez Catalina M.D.   On: 01/21/2018 11:47      Assessment & Plan:   Problem List  Items Addressed This Visit    Septic shock ([place])    Has recovered      Interstitial lung disease ([place])    By prior CXR and abdominal CT imaging - update CXR today.  Pt denies respiratory concerns.  PFTs 07/2015 were normal.  Not consistent with polio sequelae.       Relevant Orders   DG Chest 2 View (Completed)   Fatigue    Ongoing after recent hospitalization. Reviewed anticipated slowed course of recovery      Calculus of bile duct with acute cholangitis with obstruction - Primary    Episode of ascending cholangitis complicated by sepsis. Slowly recovering, back home. Completed antibiotics. Biliary stent in place. Upcoming rpt ERCP to try to remove  stones. Appreciate GI care.       Relevant Orders   Comprehensive metabolic panel (Completed)   Magnesium (Completed)   CBC with Differential/Platelet (Completed)    Other Visit Diagnoses    Hypokalemia       Relevant Orders   Comprehensive metabolic panel (Completed)   Magnesium (Completed)       No orders of the defined types were placed in this encounter.  Orders Placed This Encounter  Procedures  . DG Chest 2 View    Standing Status:   Future    Number of Occurrences:   1    Standing Expiration Date:   03/24/2019    Order Specific Question:   Reason for Exam (SYMPTOM  OR DIAGNOSIS REQUIRED)    Answer:   f/u interstitial lung disease    Order Specific Question:   Preferred imaging location?    Answer:   [facility]    Order Specific Question:   Radiology Contrast Protocol - do NOT remove file path    Answer:   \\[email or web address]  . Comprehensive metabolic panel  . Magnesium  . CBC with Differential/Platelet    Follow up plan: No follow-ups on file.  [name], MD

## 2018-01-21 NOTE — Patient Instructions (Addendum)
Chest xray today Keep follow up with GI for repeat ESRP.  Anticipate continued slowed recovery.  Labs today

## 2018-01-22 ENCOUNTER — Encounter: Payer: Self-pay | Admitting: Family Medicine

## 2018-01-22 DIAGNOSIS — J849 Interstitial pulmonary disease, unspecified: Secondary | ICD-10-CM | POA: Insufficient documentation

## 2018-01-22 NOTE — Assessment & Plan Note (Signed)
Has recovered

## 2018-01-22 NOTE — Assessment & Plan Note (Signed)
Episode of ascending cholangitis complicated by sepsis. Slowly recovering, back home. Completed antibiotics. Biliary stent in place. Upcoming rpt ERCP to try to remove stones. Appreciate GI care.

## 2018-01-22 NOTE — Assessment & Plan Note (Signed)
Ongoing after recent hospitalization. Reviewed anticipated slowed course of recovery

## 2018-01-22 NOTE — Assessment & Plan Note (Addendum)
By prior CXR and abdominal CT imaging - update CXR today.  Pt denies respiratory concerns.  PFTs 07/2015 were normal.  Not consistent with polio sequelae.

## 2018-02-08 ENCOUNTER — Encounter: Payer: Self-pay | Admitting: Physician Assistant

## 2018-02-08 ENCOUNTER — Ambulatory Visit (INDEPENDENT_AMBULATORY_CARE_PROVIDER_SITE_OTHER): Payer: Medicare Other | Admitting: Physician Assistant

## 2018-02-08 VITALS — BP 110/80 | HR 84 | Ht 69.0 in | Wt 211.2 lb

## 2018-02-08 DIAGNOSIS — Z8719 Personal history of other diseases of the digestive system: Secondary | ICD-10-CM

## 2018-02-08 DIAGNOSIS — K8050 Calculus of bile duct without cholangitis or cholecystitis without obstruction: Secondary | ICD-10-CM

## 2018-02-08 NOTE — Progress Notes (Signed)
Agree with assessment and plan.  Goal will be attempt at complete extraction via sphincterotomy extension +/- sphincteroplasty.  If necessary will consider role of EHL.  Will see tomorrow procedure.

## 2018-02-08 NOTE — Progress Notes (Addendum)
Subjective:    Patient ID: Alexander [name], male    DOB: Jul 27, 1950, 67 y.o.   MRN: [id number]  HPI Alexander [name] is a very nice 67 year old white male, known to Dr. Hilarie Fredrickson previously who was recently hospitalized on 68/01/2018 with sepsis and ascending cholangitis.  He is status post cholecystectomy in 2017. He underwent ERCP with Dr. Lyndel Safe on 01/08/2018 with finding of duodenal stenosis, there were multiple filling duct defects in the common bile duct and copious pus- CBD was dilated to 2 cm.  A limited sphincterotomy was done and an 8.5 French/5 cm plastic stent was placed.  Patient was treated with appropriate course of IV antibiotics and discharged home on 01/11/2018 on oral Keflex.  Blood cultures grew E. Coli, sensitive to Keflex.  Patient's other medical issues include hypertension, coronary artery disease status post 3 previous stents all remote, he has a postpolio syndrome, chronic pain syndrome, interstitial lung disease, obstructive sleep apnea and atrial fibrillation.  He is currently managed with aspirin, no anticoagulation. Repeat labs were done on 01/21/2018 LFTs have completely normalized with the exception of alk phos at 123, WBC of 8.4 hemoglobin 13.9, hematocrit of 40.7. Patient is already scheduled for ERCP with stent removal and stone extraction with Dr. Rush Landmark  tomorrow 02/09/2018. He says he continues to feel weak and fatigued since hospitalization and has not been out of the house much.  He denies any fever or chills or sweats, he has been eating well and has no complaints of nausea or vomiting.  He has no complaints of abdominal pain.  Review of Systems Pertinent positive and negative review of systems were noted in the above HPI section.  All other review of systems was otherwise negative.  Outpatient Encounter Medications as of 02/08/2018  Medication Sig  . acetaminophen (TYLENOL) 500 MG tablet Take 1,000 mg by mouth every 6 (six) hours as needed for moderate pain.  . Ascorbic Acid  (VITAMIN C) 1000 MG tablet Take 1,000 mg by mouth daily.  Marland Kitchen atorvastatin (LIPITOR) 80 MG tablet Take 0.5 tablets (40 mg total) daily at 6 PM by mouth. (Patient taking differently: Take 40 mg by mouth at bedtime. )  . Calcium Carb-Cholecalciferol (HM CALCIUM-VITAMIN D) 600-800 MG-UNIT TABS Take 1 tablet by mouth daily.  . Cholecalciferol ([employer]S VITAMIN D) 2000 UNITS CAPS Take 1 capsule (2,000 Units total) by mouth daily.  Marland Kitchen gabapentin (NEURONTIN) 600 MG tablet TAKE 2 TABLETS 3 TIMES A DAY (Patient taking differently: Take 1,200 mg by mouth 3 (three) times daily. )  . lisinopril (PRINIVIL,ZESTRIL) 2.5 MG tablet Take 1 tablet (2.5 mg total) by mouth daily.  . Magnesium 250 MG TABS Take 1 tablet (250 mg total) by mouth at bedtime.  . Omega-3 Fatty Acids (FISH OIL) 1000 MG CAPS Take 1 capsule by mouth daily.  Marland Kitchen omeprazole (PRILOSEC) 40 MG capsule Take 1 capsule (40 mg total) by mouth daily.  . sildenafil (VIAGRA) 100 MG tablet TAKE ONE-HALF TO ONE TABLET BY MOUTH DAILY AS NEEDED FOR ERECTILE DYSFUNCTION (Patient taking differently: Take 50-100 mg by mouth daily as needed for erectile dysfunction. )  . vitamin B-12 (CYANOCOBALAMIN) 1000 MCG tablet Take 1,000 mcg by mouth daily.  Marland Kitchen aspirin 81 MG chewable tablet Chew 1 tablet (81 mg total) by mouth daily. (Patient not taking: Reported on 01/21/2018)  . aspirin-acetaminophen-caffeine (EXCEDRIN MIGRAINE) 250-250-65 MG tablet Take 1 tablet by mouth every 6 (six) hours as needed for headache. Takes 6 tablets daily (Patient not taking: Reported on 01/21/2018)  No facility-administered encounter medications on file as of 68/03/2018    No Known Allergies Patient Active Problem List   Diagnosis Date Noted  . Interstitial lung disease ([place]) 01/22/2018  . Calculus of bile duct with acute cholangitis with obstruction   . Septic shock ([place]) 01/07/2018  . GERD (gastroesophageal reflux disease) 11/13/2017  . CAD S/P percutaneous coronary angioplasty   . PAF  (paroxysmal atrial fibrillation) ([place]); CHA2DS2VASc = 4--> Eliquis.   . Ischemic cardiomyopathy   . NSTEMI (non-ST elevated myocardial infarction) ([place]) 04/27/2015  . Essential hypertension   . Advanced care planning/counseling discussion 07/12/2014  . Obesity, Class I, BMI 30-34.9 07/12/2014  . Scapular dysfunction 06/12/2014  . Medicare annual wellness visit, subsequent 06/29/2013  . Vitamin D deficiency 03/29/2013  . Fatigue 02/27/2013  . Prediabetes 05/09/2012  . Male sexual dysfunction 05/03/2012  . Post-polio syndrome   . Chronic pain syndrome   . OSA (obstructive sleep apnea)   . Migraine   . Hyperlipidemia with target LDL less than 70    Social History   Socioeconomic History  . Marital status: Married    Spouse name: Not on file  . Number of children: 3  . Years of education: Not on file  . Highest education level: Not on file  Occupational History  . Occupation: retired  Scientific laboratory technician  . Financial resource strain: Not on file  . Food insecurity:    Worry: Not on file    Inability: Not on file  . Transportation needs:    Medical: Not on file    Non-medical: Not on file  Tobacco Use  . Smoking status: Former Smoker    Types: Cigarettes    Last attempt to quit: 1972    Years since quitting: 47.7  . Smokeless tobacco: Never Used  . Tobacco comment: was very light  Substance and Sexual Activity  . Alcohol use: Yes    Alcohol/week: 0.0 standard drinks    Comment: 0-1 per day  . Drug use: No  . Sexual activity: Yes  Lifestyle  . Physical activity:    Days per week: Not on file    Minutes per session: Not on file  . Stress: Not on file  Relationships  . Social connections:    Talks on phone: Not on file    Gets together: Not on file    Attends religious service: Not on file    Active member of club or organization: Not on file    Attends meetings of clubs or organizations: Not on file    Relationship status: Not on file  . Intimate partner violence:     Fear of current or ex partner: Not on file    Emotionally abused: Not on file    Physically abused: Not on file    Forced sexual activity: Not on file  Other Topics Concern  . 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 Wiltgen family history includes Breast cancer (age of onset: 24) in his brother; Cancer (age of onset: 57) in his sister; Diabetes in his mother; Heart disease in his mother; Hypertension in his maternal aunt.      Objective:  Vitals:   02/08/18 1005  BP: 110/80  Pulse: 84    Physical Exam; well-developed older white male in no acute distress, very pleasant accompanied by his wife patient ambulates with leg braces ( history of polio).  Blood pressure 110/80 pulse 84, height 5 foot 9, weight 211, BMI 31.2.  HEENT ;nontraumatic normocephalic EOMI PERRLA sclera anicteric oral mucosa moist, Cardiovascular; regular rate and rhythm with S1-S2.  Pulmonary; clear bilaterally, Abdomen ;soft nontender nondistended bowel sounds are active there is no palpable mass or hepatosplenomegaly, Rectal ;exam not done, Extremities; lower extremity atrophy, he is wearing braces bilateral lower extremities skin warm and dry, Neuro psych alert and oriented, grossly nonfocal mood and affect appropriate       Assessment & Plan:   #62 67 year old white male with recent hospitalization for septic shock and ascending cholangitis secondary to choledocholithiasis.  Blood cultures grew E. coli.  He has completed antibiotics. Patient underwent initial ERCP on 01/08/2018 with limited sphincterotomy and placement of plastic stent.  There were numerous filling defects in the common bile duct and a copious amount of  purulent material. Patient is much improved at this time, and most recent labs on 01/21/2018 with normalization of LFTs.   2 status post cholecystectomy 2017 #3 coronary artery disease status post 3 stents remotely 4.  Obstructive sleep apnea 5.  Interstitial lung disease 6.  Hypertension 7.  Post polio syndrome  Plan; Patient is scheduled for ERCP, stent removal and stone extraction with Dr. Rush Landmark tomorrow 02/09/2018 at [facility]. He has been off baby aspirin. Had a long discussion with the patient and his wife today regarding recent hospitalization, findings at ERCP, and reviewed plans for tomorrow.  We reviewed ERCP, sphincterotomy ,potential stent replacement, stone extraction, indications risks and benefits and he is agreeable to proceed.  Amy S Esterwood PA-C 02/08/2018   Addendum: Reviewed and agree with initial management. Pyrtle, Lajuan Lines, MD     Cc: [name], MD

## 2018-02-08 NOTE — H&P (View-Only) (Signed)
Subjective:    Patient ID: Alexander [name], male    DOB: 02/14/1951, 67 y.o.   MRN: [id number]  HPI Alexander [name] is a very nice 67 year old white male, known to Dr. Hilarie Fredrickson previously who was recently hospitalized on 01/07/2018 with sepsis and a sending cholangitis.  He is status post cholecystectomy in 2017. He underwent ERCP with Dr. Lyndel Safe on 01/08/2018 with finding of duodenal stenosis, there were multiple filling duct defects in the common bile duct and copious pus- CBD was dilated to 2 cm.  A limited sphincterotomy was done and an 8.5 French/5 cm plastic stent was placed.  Patient was treated with appropriate course of IV antibiotics and discharged home on 01/11/2018 on oral Keflex.  Blood cultures grew E. Coli, sensitive to Keflex.  Patient's other medical issues include hypertension, coronary artery disease status post 3 previous stents all remote, he has a postpolio syndrome, chronic pain syndrome, interstitial lung disease, obstructive sleep apnea and atrial fibrillation.  He is currently managed with aspirin, no anticoagulation. Repeat labs were done on 01/21/2018 LFTs have completely normalized with the exception of alk phos at 123, WBC of 8.4 hemoglobin 13.9, hematocrit of 40.7. Patient is already scheduled for ERCP with stent removal and stone extraction with Dr. Rush Landmark  tomorrow 02/09/2018. He says he continues to feel weak and fatigued since hospitalization and has not been out of the house much.  He denies any fever or chills or sweats, he has been eating well and has no complaints of nausea or vomiting.  He has no complaints of abdominal pain.  Review of Systems Pertinent positive and negative review of systems were noted in the above HPI section.  All other review of systems was otherwise negative.  Outpatient Encounter Medications as of 02/08/2018  Medication Sig  . acetaminophen (TYLENOL) 500 MG tablet Take 1,000 mg by mouth every 6 (six) hours as needed for moderate pain.  . Ascorbic Acid  (VITAMIN C) 1000 MG tablet Take 1,000 mg by mouth daily.  Marland Kitchen atorvastatin (LIPITOR) 80 MG tablet Take 0.5 tablets (40 mg total) daily at 6 PM by mouth. (Patient taking differently: Take 40 mg by mouth at bedtime. )  . Calcium Carb-Cholecalciferol (HM CALCIUM-VITAMIN D) 600-800 MG-UNIT TABS Take 1 tablet by mouth daily.  . Cholecalciferol ([employer] VITAMIN D) 2000 UNITS CAPS Take 1 capsule (2,000 Units total) by mouth daily.  Marland Kitchen gabapentin (NEURONTIN) 600 MG tablet TAKE 2 TABLETS 3 TIMES A DAY (Patient taking differently: Take 1,200 mg by mouth 3 (three) times daily. )  . lisinopril (PRINIVIL,ZESTRIL) 2.5 MG tablet Take 1 tablet (2.5 mg total) by mouth daily.  . Magnesium 250 MG TABS Take 1 tablet (250 mg total) by mouth at bedtime.  . Omega-3 Fatty Acids (FISH OIL) 1000 MG CAPS Take 1 capsule by mouth daily.  Marland Kitchen omeprazole (PRILOSEC) 40 MG capsule Take 1 capsule (40 mg total) by mouth daily.  . sildenafil (VIAGRA) 100 MG tablet TAKE ONE-HALF TO ONE TABLET BY MOUTH DAILY AS NEEDED FOR ERECTILE DYSFUNCTION (Patient taking differently: Take 50-100 mg by mouth daily as needed for erectile dysfunction. )  . vitamin B-12 (CYANOCOBALAMIN) 1000 MCG tablet Take 1,000 mcg by mouth daily.  Marland Kitchen aspirin 81 MG chewable tablet Chew 1 tablet (81 mg total) by mouth daily. (Patient not taking: Reported on 01/21/2018)  . aspirin-acetaminophen-caffeine (EXCEDRIN MIGRAINE) 250-250-65 MG tablet Take 1 tablet by mouth every 6 (six) hours as needed for headache. Takes 6 tablets daily (Patient not taking: Reported on 01/21/2018)  No facility-administered encounter medications on file as of 02/08/2018.    No Known Allergies Patient Active Problem List   Diagnosis Date Noted  . Interstitial lung disease ([place]) 01/22/2018  . Calculus of bile duct with acute cholangitis with obstruction   . Septic shock ([place]) 01/07/2018  . GERD (gastroesophageal reflux disease) 11/13/2017  . CAD S/P percutaneous coronary angioplasty   . PAF  (paroxysmal atrial fibrillation) ([place]); CHA2DS2VASc = 4--> Eliquis.   . Ischemic cardiomyopathy   . NSTEMI (non-ST elevated myocardial infarction) ([place]) 04/27/2015  . Essential hypertension   . Advanced care planning/counseling discussion 07/12/2014  . Obesity, Class I, BMI 30-34.9 07/12/2014  . Scapular dysfunction 06/12/2014  . Medicare annual wellness visit, subsequent 06/29/2013  . Vitamin D deficiency 03/29/2013  . Fatigue 02/27/2013  . Prediabetes 05/09/2012  . Male sexual dysfunction 05/03/2012  . Post-polio syndrome   . Chronic pain syndrome   . OSA (obstructive sleep apnea)   . Migraine   . Hyperlipidemia with target LDL less than 70    Social History   Socioeconomic History  . Marital status: Married    Spouse name: Not on file  . Number of children: 3  . Years of education: Not on file  . Highest education level: Not on file  Occupational History  . Occupation: retired  Scientific laboratory technician  . Financial resource strain: Not on file  . Food insecurity:    Worry: Not on file    Inability: Not on file  . Transportation needs:    Medical: Not on file    Non-medical: Not on file  Tobacco Use  . Smoking status: Former Smoker    Types: Cigarettes    Last attempt to quit: 1972    Years since quitting: 47.7  . Smokeless tobacco: Never Used  . Tobacco comment: was very light  Substance and Sexual Activity  . Alcohol use: Yes    Alcohol/week: 0.0 standard drinks    Comment: 0-1 per day  . Drug use: No  . Sexual activity: Yes  Lifestyle  . Physical activity:    Days per week: Not on file    Minutes per session: Not on file  . Stress: Not on file  Relationships  . Social connections:    Talks on phone: Not on file    Gets together: Not on file    Attends religious service: Not on file    Active member of club or organization: Not on file    Attends meetings of clubs or organizations: Not on file    Relationship status: Not on file  . Intimate partner violence:     Fear of current or ex partner: Not on file    Emotionally abused: Not on file    Physically abused: Not on file    Forced sexual activity: Not on file  Other Topics Concern  . 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 Aguilar family history includes Breast cancer (age of onset: 65) in his brother; Cancer (age of onset: 71) in his sister; Diabetes in his mother; Heart disease in his mother; Hypertension in his maternal aunt.      Objective:  Vitals:   02/08/18 1005  BP: 110/80  Pulse: 84    Physical Exam; well-developed older white male in no acute distress, very pleasant accompanied by his wife patient ambulates with leg braces ( history of polio).  Blood pressure 110/80 pulse 84, height 5 foot 9, weight 211, BMI 31.2.  HEENT ;nontraumatic normocephalic EOMI PERRLA sclera anicteric oral mucosa moist, Cardiovascular; regular rate and rhythm with S1-S2.  Pulmonary; clear bilaterally, Abdomen ;soft nontender nondistended bowel sounds are active there is no palpable mass or hepatosplenomegaly, Rectal ;exam not done, Extremities; lower extremity atrophy, he is wearing braces bilateral lower extremities skin warm and dry, Neuro psych alert and oriented, grossly nonfocal mood and affect appropriate       Assessment & Plan:   #3 67 year old white male with recent hospitalization for septic shock and a sending cholangitis secondary to choledocholithiasis.  Blood cultures grew E. coli.  He has completed antibiotics. Patient underwent initial ERCP on 01/08/2018 with limited sphincterotomy and placement of plastic stent.  There were numerous filling defects in the common bile duct and a copious amount of  purulent material. Patient is much improved at this time, and most recent labs on 01/21/2018 with normalization of LFTs.   2 status post cholecystectomy 2017 #3 coronary artery disease status post 3 stents remotely 4.  Obstructive sleep apnea 5.  Interstitial lung disease 6.  Hypertension 7.  Post polio syndrome  Plan; Patient is scheduled for ERCP, stent removal and stone extraction with Dr. Rush Landmark tomorrow 02/09/2018 at [facility]. He has been off baby aspirin. Had a long discussion with the patient and his wife today regarding recent hospitalization, findings at ERCP, and reviewed plans for tomorrow.  We reviewed ERCP, sphincterotomy ,potential stent replacement, stone extraction, indications risks and benefits and he is agreeable to proceed.  Amy S Esterwood PA-C 02/08/2018   Cc: [name], MD

## 2018-02-08 NOTE — Anesthesia Preprocedure Evaluation (Addendum)
Anesthesia Evaluation  Patient identified by MRN, date of birth, ID band Patient awake    Reviewed: Allergy & Precautions, NPO status , Patient's Chart, lab work & pertinent test results  Airway Mallampati: II  TM Distance: >3 FB Neck ROM: Full    Dental no notable dental hx. (+) Poor Dentition, Dental Advisory Given, Chipped, Missing, Loose,    Pulmonary sleep apnea , pneumonia, former smoker,    Pulmonary exam normal breath sounds clear to auscultation       Cardiovascular Exercise Tolerance: Good hypertension, Pt. on medications + CAD and + Past MI  Normal cardiovascular exam+ dysrhythmias Atrial Fibrillation  Rhythm:Regular Rate:Normal  Echo 04/28/15 Left ventricle:  The cavity size was normal. Systolic function was mildly to moderately reduced. The estimated ejection fraction was in the range of 40% to 45%.  Regional wall motion abnormalities: Probable moderate hypokinesis of the anteroseptal, anterior, anterolateral, and apical myocardium. The transmitral flow pattern was normal. The deceleration time of the early transmitral flow velocity was normal. The pulmonary vein flow pattern was normal.   Neuro/Psych    GI/Hepatic GERD  ,  Endo/Other    Renal/GU      Musculoskeletal   Abdominal (+) + obese,   Peds  Hematology   Anesthesia Other Findings   Reproductive/Obstetrics                           Lab Results  Component Value Date   WBC 8.4 01/21/2018   HGB 13.9 01/21/2018   HCT 40.7 01/21/2018   MCV 94.3 01/21/2018   PLT 537.0 (H) 01/21/2018    Anesthesia Physical Anesthesia Plan  ASA: III  Anesthesia Plan: General   Post-op Pain Management:    Induction: Intravenous  PONV Risk Score and Plan: 2 and Treatment may vary due to age or medical condition, Dexamethasone and Ondansetron  Airway Management Planned: Oral ETT  Additional Equipment:   Intra-op Plan:    Post-operative Plan: Extubation in OR  Informed Consent: I have reviewed the patients History and Physical, chart, labs and discussed the procedure including the risks, benefits and alternatives for the proposed anesthesia with the patient or authorized representative who has indicated his/her understanding and acceptance.   Dental advisory given  Plan Discussed with:   Anesthesia Plan Comments:         Anesthesia Quick Evaluation

## 2018-02-08 NOTE — Patient Instructions (Signed)
Arrive at 6:00 Am to [facility],.. Go to registration.  Go to the [employer].  Valet parking starts at 5:00 am.   Normal BMI (Body Mass Index- based on height and weight) is between 23 and 30. Your BMI today is Body mass index is 31.2 kg/m. Marland Kitchen Please consider follow up  regarding your BMI with your Primary Care Provider.

## 2018-02-09 ENCOUNTER — Other Ambulatory Visit: Payer: Self-pay

## 2018-02-09 ENCOUNTER — Ambulatory Visit (HOSPITAL_COMMUNITY): Payer: Medicare Other | Admitting: Anesthesiology

## 2018-02-09 ENCOUNTER — Ambulatory Visit (HOSPITAL_COMMUNITY)
Admission: RE | Admit: 2018-02-09 | Discharge: 2018-02-09 | Disposition: A | Discharge status: Home / Self Care | Payer: Medicare Other | Source: Ambulatory Visit | Attending: Gastroenterology | Admitting: Gastroenterology

## 2018-02-09 ENCOUNTER — Encounter (HOSPITAL_COMMUNITY)
Admission: RE | Disposition: A | Discharge status: Home / Self Care | Payer: Self-pay | Source: Ambulatory Visit | Attending: Gastroenterology

## 2018-02-09 ENCOUNTER — Encounter (HOSPITAL_COMMUNITY): Payer: Self-pay | Admitting: Gastroenterology

## 2018-02-09 ENCOUNTER — Ambulatory Visit (HOSPITAL_COMMUNITY): Payer: Medicare Other

## 2018-02-09 ENCOUNTER — Other Ambulatory Visit: Payer: Self-pay | Admitting: Gastroenterology

## 2018-02-09 DIAGNOSIS — G4733 Obstructive sleep apnea (adult) (pediatric): Secondary | ICD-10-CM | POA: Insufficient documentation

## 2018-02-09 DIAGNOSIS — T85590A Other mechanical complication of bile duct prosthesis, initial encounter: Secondary | ICD-10-CM

## 2018-02-09 DIAGNOSIS — I2510 Atherosclerotic heart disease of native coronary artery without angina pectoris: Secondary | ICD-10-CM | POA: Insufficient documentation

## 2018-02-09 DIAGNOSIS — Z79899 Other long term (current) drug therapy: Secondary | ICD-10-CM | POA: Diagnosis not present

## 2018-02-09 DIAGNOSIS — Z87891 Personal history of nicotine dependence: Secondary | ICD-10-CM | POA: Diagnosis not present

## 2018-02-09 DIAGNOSIS — Z7982 Long term (current) use of aspirin: Secondary | ICD-10-CM | POA: Diagnosis not present

## 2018-02-09 DIAGNOSIS — K3189 Other diseases of stomach and duodenum: Secondary | ICD-10-CM

## 2018-02-09 DIAGNOSIS — K315 Obstruction of duodenum: Secondary | ICD-10-CM | POA: Insufficient documentation

## 2018-02-09 DIAGNOSIS — K831 Obstruction of bile duct: Secondary | ICD-10-CM | POA: Diagnosis not present

## 2018-02-09 DIAGNOSIS — Z4659 Encounter for fitting and adjustment of other gastrointestinal appliance and device: Secondary | ICD-10-CM | POA: Diagnosis not present

## 2018-02-09 DIAGNOSIS — E785 Hyperlipidemia, unspecified: Secondary | ICD-10-CM | POA: Diagnosis not present

## 2018-02-09 DIAGNOSIS — I252 Old myocardial infarction: Secondary | ICD-10-CM | POA: Diagnosis not present

## 2018-02-09 DIAGNOSIS — I10 Essential (primary) hypertension: Secondary | ICD-10-CM | POA: Diagnosis not present

## 2018-02-09 DIAGNOSIS — K8031 Calculus of bile duct with cholangitis, unspecified, with obstruction: Secondary | ICD-10-CM | POA: Diagnosis not present

## 2018-02-09 DIAGNOSIS — I480 Paroxysmal atrial fibrillation: Secondary | ICD-10-CM | POA: Insufficient documentation

## 2018-02-09 DIAGNOSIS — G14 Postpolio syndrome: Secondary | ICD-10-CM | POA: Diagnosis not present

## 2018-02-09 DIAGNOSIS — G894 Chronic pain syndrome: Secondary | ICD-10-CM | POA: Insufficient documentation

## 2018-02-09 DIAGNOSIS — J849 Interstitial pulmonary disease, unspecified: Secondary | ICD-10-CM | POA: Insufficient documentation

## 2018-02-09 DIAGNOSIS — K219 Gastro-esophageal reflux disease without esophagitis: Secondary | ICD-10-CM | POA: Diagnosis not present

## 2018-02-09 DIAGNOSIS — Z4689 Encounter for fitting and adjustment of other specified devices: Secondary | ICD-10-CM | POA: Diagnosis not present

## 2018-02-09 DIAGNOSIS — K8020 Calculus of gallbladder without cholecystitis without obstruction: Secondary | ICD-10-CM | POA: Diagnosis not present

## 2018-02-09 DIAGNOSIS — I255 Ischemic cardiomyopathy: Secondary | ICD-10-CM | POA: Insufficient documentation

## 2018-02-09 DIAGNOSIS — K8309 Other cholangitis: Secondary | ICD-10-CM

## 2018-02-09 DIAGNOSIS — K8033 Calculus of bile duct with acute cholangitis with obstruction: Secondary | ICD-10-CM

## 2018-02-09 DIAGNOSIS — R52 Pain, unspecified: Secondary | ICD-10-CM

## 2018-02-09 HISTORY — PX: SPHINCTEROTOMY: SHX5544

## 2018-02-09 HISTORY — PX: STENT REMOVAL: SHX6421

## 2018-02-09 HISTORY — PX: BILIARY STENT PLACEMENT: SHX5538

## 2018-02-09 HISTORY — PX: ENDOSCOPIC RETROGRADE CHOLANGIOPANCREATOGRAPHY (ERCP) WITH PROPOFOL: SHX5810

## 2018-02-09 SURGERY — ENDOSCOPIC RETROGRADE CHOLANGIOPANCREATOGRAPHY (ERCP) WITH PROPOFOL
Anesthesia: General

## 2018-02-09 MED ORDER — MIDAZOLAM HCL 2 MG/2ML IJ SOLN
INTRAMUSCULAR | Status: DC | PRN
  Administered 2018-02-09: 1 mg via INTRAVENOUS

## 2018-02-09 MED ORDER — SODIUM CHLORIDE 0.9 % IV SOLN
INTRAVENOUS | Status: DC | PRN
  Administered 2018-02-09: 90 mL

## 2018-02-09 MED ORDER — CIPROFLOXACIN IN D5W 400 MG/200ML IV SOLN
INTRAVENOUS | Status: DC | PRN
  Administered 2018-02-09: 400 mg via INTRAVENOUS

## 2018-02-09 MED ORDER — FENTANYL CITRATE (PF) 100 MCG/2ML IJ SOLN
INTRAMUSCULAR | Status: AC
  Filled 2018-02-09: qty 2

## 2018-02-09 MED ORDER — PROPOFOL 10 MG/ML IV BOLUS
INTRAVENOUS | Status: AC
  Filled 2018-02-09: qty 20

## 2018-02-09 MED ORDER — LIDOCAINE 2% (20 MG/ML) 5 ML SYRINGE
INTRAMUSCULAR | Status: DC | PRN
  Administered 2018-02-09: 60 mg via INTRAVENOUS

## 2018-02-09 MED ORDER — INDOMETHACIN 50 MG RE SUPP
RECTAL | Status: AC
  Filled 2018-02-09: qty 2

## 2018-02-09 MED ORDER — ONDANSETRON HCL 4 MG/2ML IJ SOLN
INTRAMUSCULAR | Status: DC | PRN
  Administered 2018-02-09: 4 mg via INTRAVENOUS

## 2018-02-09 MED ORDER — ROCURONIUM BROMIDE 10 MG/ML (PF) SYRINGE
PREFILLED_SYRINGE | INTRAVENOUS | Status: DC | PRN
  Administered 2018-02-09: 50 mg via INTRAVENOUS

## 2018-02-09 MED ORDER — DEXAMETHASONE SODIUM PHOSPHATE 10 MG/ML IJ SOLN
INTRAMUSCULAR | Status: DC | PRN
  Administered 2018-02-09: 5 mg via INTRAVENOUS

## 2018-02-09 MED ORDER — SODIUM CHLORIDE 0.9 % IV SOLN: INTRAVENOUS | Status: DC

## 2018-02-09 MED ORDER — MIDAZOLAM HCL 2 MG/2ML IJ SOLN
INTRAMUSCULAR | Status: AC
  Filled 2018-02-09: qty 2

## 2018-02-09 MED ORDER — GLUCAGON HCL RDNA (DIAGNOSTIC) 1 MG IJ SOLR
INTRAMUSCULAR | Status: AC
  Filled 2018-02-09: qty 1

## 2018-02-09 MED ORDER — SUGAMMADEX SODIUM 500 MG/5ML IV SOLN
INTRAVENOUS | Status: DC | PRN
  Administered 2018-02-09: 250 mg via INTRAVENOUS

## 2018-02-09 MED ORDER — FENTANYL CITRATE (PF) 100 MCG/2ML IJ SOLN
INTRAMUSCULAR | Status: DC | PRN
  Administered 2018-02-09 (×2): 50 ug via INTRAVENOUS

## 2018-02-09 MED ORDER — SODIUM CHLORIDE 0.9 % IV SOLN
INTRAVENOUS | Status: DC | PRN
  Administered 2018-02-09: 5 ug/min via INTRAVENOUS

## 2018-02-09 MED ORDER — PROPOFOL 10 MG/ML IV BOLUS
INTRAVENOUS | Status: DC | PRN
  Administered 2018-02-09: 150 mg via INTRAVENOUS

## 2018-02-09 MED ORDER — INDOMETHACIN 50 MG RE SUPP
RECTAL | Status: DC | PRN
  Administered 2018-02-09: 100 mg via RECTAL

## 2018-02-09 MED ORDER — CEPHALEXIN 500 MG PO CAPS: 500.0000 mg | ORAL_CAPSULE | Freq: Four times a day (QID) | ORAL | 0 refills | Status: AC

## 2018-02-09 MED ORDER — GLUCAGON HCL RDNA (DIAGNOSTIC) 1 MG IJ SOLR
INTRAMUSCULAR | Status: DC | PRN
  Administered 2018-02-09: 0.25 mg via INTRAVENOUS

## 2018-02-09 MED ORDER — CIPROFLOXACIN IN D5W 400 MG/200ML IV SOLN
INTRAVENOUS | Status: AC
  Filled 2018-02-09: qty 200

## 2018-02-09 MED ORDER — LACTATED RINGERS IV SOLN
INTRAVENOUS | Status: DC
  Administered 2018-02-09: 08:00:00 via INTRAVENOUS
  Administered 2018-02-09: 1000 mL via INTRAVENOUS

## 2018-02-09 NOTE — Anesthesia Postprocedure Evaluation (Signed)
Anesthesia Post Note  Patient: Alexander [name]  Procedure(s) Performed: ENDOSCOPIC RETROGRADE CHOLANGIOPANCREATOGRAPHY (ERCP) WITH PROPOFOL (N/A ) STENT REMOVAL BILIARY STENT PLACEMENT (N/A ) SPHINCTEROTOMY BILIARY DILITATION (N/A )     Patient location during evaluation: Endoscopy Anesthesia Type: General Level of consciousness: awake and alert Pain management: pain level controlled Vital Signs Assessment: post-procedure vital signs reviewed and stable Respiratory status: spontaneous breathing, nonlabored ventilation, respiratory function stable and patient connected to nasal cannula oxygen Cardiovascular status: blood pressure returned to baseline and stable Postop Assessment: no apparent nausea or vomiting Anesthetic complications: no    Last Vitals:  Vitals:   02/09/18 1050 02/09/18 1100  BP: 131/88 (!) 136/91  Pulse: 75 70  Resp: 17 12  Temp:    SpO2: 100% 95%    Last Pain:  Vitals:   02/09/18 1100  TempSrc:   PainSc: 0-No pain                 Barnet Glasgow

## 2018-02-09 NOTE — Discharge Instructions (Signed)
YOU HAD AN ENDOSCOPIC PROCEDURE TODAY: Refer to the procedure report and other information in the discharge instructions given to you for any specific questions about what was found during the examination. If this information does not answer your questions, please call [place] office at [phone] to clarify.  ° °YOU SHOULD EXPECT: Some feelings of bloating in the abdomen. Passage of more gas than usual. Walking can help get rid of the air that was put into your GI tract during the procedure and reduce the bloating. If you had a lower endoscopy (such as a colonoscopy or flexible sigmoidoscopy) you may notice spotting of blood in your stool or on the toilet paper. Some abdominal soreness may be present for a day or two, also. ° °DIET: Your first meal following the procedure should be a light meal and then it is ok to progress to your normal diet. A half-sandwich or bowl of soup is an example of a good first meal. Heavy or fried foods are harder to digest and may make you feel nauseous or bloated. Drink plenty of fluids but you should avoid alcoholic beverages for 24 hours. If you had a esophageal dilation, please see attached instructions for diet.   ° °ACTIVITY: Your care partner should take you home directly after the procedure. You should plan to take it easy, moving slowly for the rest of the day. You can resume normal activity the day after the procedure however YOU SHOULD NOT DRIVE, use power tools, machinery or perform tasks that involve climbing or major physical exertion for 24 hours (because of the sedation medicines used during the test).  ° °SYMPTOMS TO REPORT IMMEDIATELY: °A gastroenterologist can be reached at any hour. Please call [phone]  for any of the following symptoms:  °Following lower endoscopy (colonoscopy, flexible sigmoidoscopy) °Excessive amounts of blood in the stool  °Significant tenderness, worsening of abdominal pains  °Swelling of the abdomen that is new, acute  °Fever of 100° or  higher  °Following upper endoscopy (EGD, EUS, ERCP, esophageal dilation) °Vomiting of blood or coffee ground material  °New, significant abdominal pain  °New, significant chest pain or pain under the shoulder blades  °Painful or persistently difficult swallowing  °New shortness of breath  °Black, tarry-looking or red, bloody stools ° °FOLLOW UP:  °If any biopsies were taken you will be contacted by phone or by letter within the next 1-3 weeks. Call [phone]  if you have not heard about the biopsies in 3 weeks.  °Please also call with any specific questions about appointments or follow up tests. ° °

## 2018-02-09 NOTE — Transfer of Care (Signed)
Immediate Anesthesia Transfer of Care Note  Patient: Alexander [name]  Procedure(s) Performed: ENDOSCOPIC RETROGRADE CHOLANGIOPANCREATOGRAPHY (ERCP) WITH PROPOFOL (N/A )  Patient Location: PACU  Anesthesia Type:General  Level of Consciousness: awake and alert   Airway & Oxygen Therapy: Patient Spontanous Breathing and Patient connected to face mask oxygen  Post-op Assessment: Report given to RN and Post -op Vital signs reviewed and stable  Post vital signs: Reviewed and stable  Last Vitals:  Vitals Value Taken Time  BP 145/99 02/09/2018 10:46 AM  Temp    Pulse 72 02/09/2018 10:47 AM  Resp 16 02/09/2018 10:47 AM  SpO2 100 % 02/09/2018 10:47 AM  Vitals shown include unvalidated device data.  Last Pain:  Vitals:   02/09/18 0746  TempSrc: Oral  PainSc: 0-No pain         Complications: No apparent anesthesia complications

## 2018-02-09 NOTE — Anesthesia Procedure Notes (Signed)
Procedure Name: Intubation Date/Time: 02/09/2018 8:49 AM Performed by: Cynda Familia, CRNA Pre-anesthesia Checklist: Patient identified, Emergency Drugs available, Suction available and Patient being monitored Patient Re-evaluated:Patient Re-evaluated prior to induction Oxygen Delivery Method: Circle System Utilized Preoxygenation: Pre-oxygenation with 100% oxygen Induction Type: IV induction and Cricoid Pressure applied Ventilation: Mask ventilation without difficulty Grade View: Grade I Tube type: Oral Tube size: 7.5 mm Number of attempts: 1 Airway Equipment and Method: Stylet Placement Confirmation: ETT inserted through vocal cords under direct vision,  positive ETCO2 and breath sounds checked- equal and bilateral Secured at: 22 cm Tube secured with: Tape Dental Injury: Teeth and Oropharynx as per pre-operative assessment  Comments: Smooth IV induction Houser-- intubation AM CRNA atraumatic-- very poor dentition-- mnay missing teeth and upper right tooth loose-- GI MD and MDA aware-- RN placed bite block for scope-- bilat BS Houser

## 2018-02-09 NOTE — Anesthesia Procedure Notes (Signed)
Date/Time: 02/09/2018 10:35 AM Performed by: Cynda Familia, CRNA Oxygen Delivery Method: Simple face mask Placement Confirmation: positive ETCO2 and breath sounds checked- equal and bilateral Dental Injury: Teeth and Oropharynx as per pre-operative assessment

## 2018-02-09 NOTE — Interval H&P Note (Signed)
History and Physical Interval Note:  02/09/2018 8:31 AM  Alexander [name]  has presented today for surgery, with the diagnosis of stone extraction, CBD stent removal  The various methods of treatment have been discussed with the patient and family. After consideration of risks, benefits and other options for treatment, the patient has consented to  Procedure(s): ENDOSCOPIC RETROGRADE CHOLANGIOPANCREATOGRAPHY (ERCP) WITH PROPOFOL (N/A) as a surgical intervention .  [name] history has been reviewed, patient examined, no change in status, stable for surgery.  I have reviewed the patient's chart and labs.  Questions were answered to the patient's satisfaction.     [employer]

## 2018-02-09 NOTE — Op Note (Signed)
[facility] Patient Name: Alexander [name] Procedure Date: 02/09/2018 MRN: [id number] Attending MD: Justice Britain , MD Date of Birth: [birth year] CSN: [id number] Age: 67 Admit Type: Inpatient Procedure:                ERCP Indications:              Bile duct stone(s), Common bile duct stone(s),                            Follow-up of ascending cholangitis, Biliary stent                            removal Providers:                Justice Britain, MD, Cleda Daub, RN, Alan Mulder, Technician, Glenis Smoker, CRNA Referring MD:             Jackquline Denmark, MD Medicines:                General Anesthesia, Indomethacin 093 mg PR Complications:            No immediate complications. Estimated Blood Loss:     Estimated blood loss was minimal. Procedure:                Pre-Anesthesia Assessment:                           - Prior to the procedure, a History and Physical                            was performed, and patient medications and                            allergies were reviewed. [name] tolerance of                            previous anesthesia was also reviewed. The risks                            and benefits of the procedure and the sedation                            options and risks were discussed with the patient.                            All questions were answered, and informed consent                            was obtained. Prior Anticoagulants: The patient has                            taken no previous anticoagulant or antiplatelet  agents. ASA Grade Assessment: II - A patient with                            mild systemic disease. After reviewing the risks                            and benefits, the patient was deemed in                            satisfactory condition to undergo the procedure.                           After obtaining informed consent, the scope was              passed under direct vision. Throughout the                            procedure, the patient's blood pressure, pulse, and                            oxygen saturations were monitored continuously. The                            [id number]-[id number] ([id number]) Olympus ERCP was introduced                            through the mouth, and used to inject contrast into                            and used to inject contrast into the bile duct. The                            ERCP was accomplished without difficulty. The                            patient tolerated the procedure well. Scope In: Scope Out: Findings:      A biliary stent was visible on the scout film.      The upper GI tract was traversed under direct vision without detailed       examination. A J-shaped deformity was found in the entire examined       stomach. An acquired benign-appearing, intrinsic moderate stenosis was       found in the second portion of the duodenum just distal to the major       papilla and was traversed. This prevented a steady position for the       procedure. A biliary sphincterotomy had been performed. The       sphincterotomy appeared open. One plastic biliary stent originating in       the biliary tree was emerging from the major papilla. The stent was       partially occluded. One stent was removed from the biliary tree using a       snare.      0.035 inch x 260 cm straight Hydra Jagwire was passed into the biliary       tree. The short-nosed  traction sphincterotome was passed over the       guidewire and the bile duct was then deeply cannulated. Contrast was       injected. I personally interpreted the bile duct images. Ductal flow of       contrast was adequate. Image quality was adequate. Contrast extended to       the hepatic ducts. Opacification of the entire biliary tree except for       the cystic duct and gallbladder was successful. The main bile duct was       moderately dilated and diffusely  dilated, with an obstruction. The       largest diameter was 10 mm. The lower third of the main bile duct and       middle third of the main bile duct contained filling defects thought to       be sludge. A 7 mm biliary sphincterotomy extension was made with a       monofilament short nose sphincterotome using ERBE electrocautery. There       was no post-sphincterotomy bleeding. The biliary pancreatic junction and       the lower third of the main bile duct were successfully dilated with an       01-07-09 mm balloon (to a maximum balloon size of 10 mm) dilator. To       discover objects, the biliary tree was swept with a retrieval balloon       starting at the bifurcation. Thick, inspisatted sludge and debris an       stone material was swept from the duct. Some sludge balls remained. One       [place] 10 Fr by 7 cm plastic biliary stent with a single external flap       and a single internal flap was placed into the common bile duct. Bile       flowed through the stent. The stent was in good position.      Pancreatogram not performed.      The endoscope was withdrawn from the patient. Impression:               - J-shaped deformity in the entire stomach.                           - Acquired duodenal stenosis just distal to the                            ampulla, precluding stable positioning.                           - Prior biliary sphincterotomy appeared open.                           - One partially occluded stent from the biliary                            tree was seen in the major papilla. This was                            removed.                           -  The fluoroscopic examination was suspicious for                            sludge.                           - The entire main bile duct was moderately dilated,                            with an obstruction.                           - Choledocholithiasis/sludge balls were found.                            Partial removal was  accomplished with biliary                            sphincterotomy extension, sphincteroplasty and                            balloon sweep with stent insertion at the end of                            the procedure. Moderate Sedation:      N/A- Per Anesthesia Care Recommendation:           - The patient will be observed post-procedure,                            until all discharge criteria are met.                           - Discharge patient to home.                           - Patient has a contact number available for                            emergencies. The signs and symptoms of potential                            delayed complications were discussed with the                            patient. Return to normal activities tomorrow.                            Written discharge instructions were provided to the                            patient.                           - Check liver enzymes (AST, ALT, alkaline  phosphatase, bilirubin) in 1 week.                           - Observe patient's clinical course.                           - Repeat ERCP in 4 weeks for retreatment.                           - Watch for pancreatitis, bleeding, perforation,                            and cholangitis.                           - Will give 5-days of Keflex which was what patient                            was sensitive to and sent home with during his                            recent cholangitis admission.                           - The findings and recommendations were discussed                            with the patient.                           - The findings and recommendations were discussed                            with the patient's family. Procedure Code(s):        --- Professional ---                           [phone], Endoscopic retrograde                            cholangiopancreatography (ERCP); with removal and                             exchange of stent(s), biliary or pancreatic duct,                            including pre- and post-dilation and guide wire                            passage, when performed, including sphincterotomy,                            when performed, each stent exchanged                           [id number], Endoscopic retrograde  cholangiopancreatography (ERCP); with removal of                            calculi/debris from biliary/pancreatic duct(s) Diagnosis Code(s):        --- Professional ---                           K31.89, Other diseases of stomach and duodenum                           K31.5, Obstruction of duodenum                           K80.30, Calculus of bile duct with cholangitis,                            unspecified, without obstruction                           T85.590A, Other mechanical complication of bile                            duct prosthesis, initial encounter                           K83.1, Obstruction of bile duct                           Z46.59, Encounter for fitting and adjustment of                            other gastrointestinal appliance and device                           K83.09, Other cholangitis CPT copyright 2017 American Medical Association. All rights reserved. The codes documented in this report are preliminary and upon coder review may  be revised to meet current compliance requirements. Justice Britain, MD 02/09/2018 1:52:13 PM Number of Addenda: 0

## 2018-02-10 ENCOUNTER — Encounter (HOSPITAL_COMMUNITY): Payer: Self-pay | Admitting: Gastroenterology

## 2018-02-14 ENCOUNTER — Telehealth: Payer: Self-pay | Admitting: Gastroenterology

## 2018-02-14 NOTE — Telephone Encounter (Signed)
Left message to call back. Flu vaccine should come from PCP

## 2018-02-14 NOTE — Telephone Encounter (Signed)
Wife is calling about scheduling the next ERCP. She was told it will be in approximately 4 weeks. Patient will get a flu shot prior to this date. She wanted to be certain this was okay. Please advise as to scheduling. Thanks!

## 2018-02-14 NOTE — Telephone Encounter (Signed)
Saw Alexander [name] 9/16

## 2018-02-15 NOTE — Telephone Encounter (Signed)
Staff message to get pt set up for ERCP in 4-6 weeks.

## 2018-02-15 NOTE — Telephone Encounter (Signed)
-----   Message from Irving Copas., MD sent at 02/14/2018  6:11 PM EDT ----- Chong Sicilian, Please place on ERCP recall list for 4-6 weeks for stent exchange. Thanks. Chester Holstein

## 2018-02-16 ENCOUNTER — Telehealth: Payer: Self-pay | Admitting: Gastroenterology

## 2018-02-16 NOTE — Telephone Encounter (Signed)
Pt's wife [name] calling regarding some questions that you were going to ask Dr. Rush Landmark and get back to her.       Documentation     Forquer,Robyn [phone]  Thelma Barge, Catherin S

## 2018-02-16 NOTE — Telephone Encounter (Signed)
Pt's wife [name] calling regarding some questions that you were going to ask Dr. Rush Landmark and get back to her.

## 2018-02-17 ENCOUNTER — Other Ambulatory Visit: Payer: Self-pay

## 2018-02-17 DIAGNOSIS — K8033 Calculus of bile duct with acute cholangitis with obstruction: Secondary | ICD-10-CM

## 2018-02-17 DIAGNOSIS — Z4659 Encounter for fitting and adjustment of other gastrointestinal appliance and device: Secondary | ICD-10-CM

## 2018-02-17 NOTE — Telephone Encounter (Signed)
Left message on machine to call back  

## 2018-02-17 NOTE — Telephone Encounter (Signed)
appt moved to 10/30 [facility] at 915 am

## 2018-02-17 NOTE — Telephone Encounter (Signed)
Need to call pt.  Instructions sent via My Chart

## 2018-02-17 NOTE — Telephone Encounter (Signed)
appt to be made for 10/28 at 730 am [facility] for ERCP

## 2018-02-18 NOTE — Telephone Encounter (Signed)
ERCP scheduled, pt instructed and medications reviewed.  Patient instructions mailed to home.  Patient to call with any questions or concerns.  

## 2018-02-21 DIAGNOSIS — Z23 Encounter for immunization: Secondary | ICD-10-CM | POA: Diagnosis not present

## 2018-03-04 ENCOUNTER — Encounter: Payer: Self-pay | Admitting: Family Medicine

## 2018-03-28 ENCOUNTER — Other Ambulatory Visit: Payer: Self-pay

## 2018-03-28 ENCOUNTER — Encounter (HOSPITAL_COMMUNITY): Payer: Self-pay | Admitting: *Deleted

## 2018-03-30 ENCOUNTER — Other Ambulatory Visit: Payer: Self-pay

## 2018-03-30 ENCOUNTER — Encounter (HOSPITAL_COMMUNITY)
Admission: RE | Disposition: A | Discharge status: Home / Self Care | Payer: Self-pay | Source: Ambulatory Visit | Attending: Gastroenterology

## 2018-03-30 ENCOUNTER — Encounter (HOSPITAL_COMMUNITY): Payer: Self-pay | Admitting: Emergency Medicine

## 2018-03-30 ENCOUNTER — Ambulatory Visit (HOSPITAL_COMMUNITY): Payer: Medicare Other | Admitting: Anesthesiology

## 2018-03-30 ENCOUNTER — Ambulatory Visit (HOSPITAL_COMMUNITY)
Admission: RE | Admit: 2018-03-30 | Discharge: 2018-03-30 | Disposition: A | Discharge status: Home / Self Care | Payer: Medicare Other | Source: Ambulatory Visit | Attending: Gastroenterology | Admitting: Gastroenterology

## 2018-03-30 ENCOUNTER — Ambulatory Visit (HOSPITAL_COMMUNITY): Payer: Medicare Other

## 2018-03-30 DIAGNOSIS — K8031 Calculus of bile duct with cholangitis, unspecified, with obstruction: Secondary | ICD-10-CM | POA: Diagnosis not present

## 2018-03-30 DIAGNOSIS — G894 Chronic pain syndrome: Secondary | ICD-10-CM | POA: Diagnosis not present

## 2018-03-30 DIAGNOSIS — K8030 Calculus of bile duct with cholangitis, unspecified, without obstruction: Secondary | ICD-10-CM | POA: Diagnosis not present

## 2018-03-30 DIAGNOSIS — Z8249 Family history of ischemic heart disease and other diseases of the circulatory system: Secondary | ICD-10-CM | POA: Insufficient documentation

## 2018-03-30 DIAGNOSIS — K8050 Calculus of bile duct without cholangitis or cholecystitis without obstruction: Secondary | ICD-10-CM | POA: Diagnosis not present

## 2018-03-30 DIAGNOSIS — I255 Ischemic cardiomyopathy: Secondary | ICD-10-CM | POA: Diagnosis not present

## 2018-03-30 DIAGNOSIS — I252 Old myocardial infarction: Secondary | ICD-10-CM | POA: Diagnosis not present

## 2018-03-30 DIAGNOSIS — I10 Essential (primary) hypertension: Secondary | ICD-10-CM | POA: Diagnosis not present

## 2018-03-30 DIAGNOSIS — Z4659 Encounter for fitting and adjustment of other gastrointestinal appliance and device: Secondary | ICD-10-CM | POA: Diagnosis not present

## 2018-03-30 DIAGNOSIS — K8033 Calculus of bile duct with acute cholangitis with obstruction: Secondary | ICD-10-CM

## 2018-03-30 DIAGNOSIS — Z955 Presence of coronary angioplasty implant and graft: Secondary | ICD-10-CM | POA: Diagnosis not present

## 2018-03-30 DIAGNOSIS — I480 Paroxysmal atrial fibrillation: Secondary | ICD-10-CM | POA: Diagnosis not present

## 2018-03-30 DIAGNOSIS — K219 Gastro-esophageal reflux disease without esophagitis: Secondary | ICD-10-CM | POA: Diagnosis not present

## 2018-03-30 DIAGNOSIS — G4733 Obstructive sleep apnea (adult) (pediatric): Secondary | ICD-10-CM | POA: Diagnosis not present

## 2018-03-30 DIAGNOSIS — R8569 Abnormal cytological findings in specimens from other digestive organs and abdominal cavity: Secondary | ICD-10-CM | POA: Diagnosis not present

## 2018-03-30 DIAGNOSIS — Z87891 Personal history of nicotine dependence: Secondary | ICD-10-CM | POA: Diagnosis not present

## 2018-03-30 DIAGNOSIS — M1990 Unspecified osteoarthritis, unspecified site: Secondary | ICD-10-CM | POA: Insufficient documentation

## 2018-03-30 DIAGNOSIS — K838 Other specified diseases of biliary tract: Secondary | ICD-10-CM | POA: Diagnosis not present

## 2018-03-30 DIAGNOSIS — K3189 Other diseases of stomach and duodenum: Secondary | ICD-10-CM | POA: Diagnosis not present

## 2018-03-30 DIAGNOSIS — I2510 Atherosclerotic heart disease of native coronary artery without angina pectoris: Secondary | ICD-10-CM | POA: Diagnosis not present

## 2018-03-30 DIAGNOSIS — K315 Obstruction of duodenum: Secondary | ICD-10-CM

## 2018-03-30 HISTORY — PX: STENT REMOVAL: SHX6421

## 2018-03-30 HISTORY — PX: REMOVAL OF STONES: SHX5545

## 2018-03-30 HISTORY — PX: ESOPHAGOGASTRODUODENOSCOPY: SHX5428

## 2018-03-30 HISTORY — PX: ENDOSCOPIC RETROGRADE CHOLANGIOPANCREATOGRAPHY (ERCP) WITH PROPOFOL: SHX5810

## 2018-03-30 SURGERY — ENDOSCOPIC RETROGRADE CHOLANGIOPANCREATOGRAPHY (ERCP) WITH PROPOFOL
Anesthesia: General

## 2018-03-30 MED ORDER — DEXAMETHASONE SODIUM PHOSPHATE 4 MG/ML IJ SOLN
INTRAMUSCULAR | Status: DC | PRN
  Administered 2018-03-30: 10 mg via INTRAVENOUS

## 2018-03-30 MED ORDER — LIDOCAINE 2% (20 MG/ML) 5 ML SYRINGE
INTRAMUSCULAR | Status: DC | PRN
  Administered 2018-03-30: 80 mg via INTRAVENOUS

## 2018-03-30 MED ORDER — GLUCAGON HCL RDNA (DIAGNOSTIC) 1 MG IJ SOLR
INTRAMUSCULAR | Status: DC | PRN
  Administered 2018-03-30 (×2): 0.25 mg via INTRAVENOUS

## 2018-03-30 MED ORDER — PROPOFOL 10 MG/ML IV BOLUS
INTRAVENOUS | Status: DC | PRN
  Administered 2018-03-30: 150 mg via INTRAVENOUS

## 2018-03-30 MED ORDER — SUGAMMADEX SODIUM 200 MG/2ML IV SOLN
INTRAVENOUS | Status: DC | PRN
  Administered 2018-03-30: 200 mg via INTRAVENOUS

## 2018-03-30 MED ORDER — ONDANSETRON HCL 4 MG/2ML IJ SOLN
INTRAMUSCULAR | Status: DC | PRN
  Administered 2018-03-30: 4 mg via INTRAVENOUS

## 2018-03-30 MED ORDER — GLUCAGON HCL RDNA (DIAGNOSTIC) 1 MG IJ SOLR
INTRAMUSCULAR | Status: AC
  Filled 2018-03-30: qty 1

## 2018-03-30 MED ORDER — SODIUM CHLORIDE 0.9 % IV SOLN
INTRAVENOUS | Status: DC | PRN
  Administered 2018-03-30: 75 mL

## 2018-03-30 MED ORDER — INDOMETHACIN 50 MG RE SUPP
RECTAL | Status: DC | PRN
  Administered 2018-03-30: 100 mg via RECTAL

## 2018-03-30 MED ORDER — LACTATED RINGERS IV SOLN
INTRAVENOUS | Status: DC
  Administered 2018-03-30 (×2): via INTRAVENOUS

## 2018-03-30 MED ORDER — CIPROFLOXACIN HCL 500 MG PO TABS: 500.0000 mg | ORAL_TABLET | Freq: Two times a day (BID) | ORAL | 0 refills | Status: AC

## 2018-03-30 MED ORDER — CIPROFLOXACIN IN D5W 400 MG/200ML IV SOLN
INTRAVENOUS | Status: AC
  Filled 2018-03-30: qty 200

## 2018-03-30 MED ORDER — CIPROFLOXACIN IN D5W 400 MG/200ML IV SOLN
INTRAVENOUS | Status: DC | PRN
  Administered 2018-03-30: 400 mg via INTRAVENOUS

## 2018-03-30 MED ORDER — PROPOFOL 10 MG/ML IV BOLUS
INTRAVENOUS | Status: AC
  Filled 2018-03-30: qty 20

## 2018-03-30 MED ORDER — ROCURONIUM BROMIDE 10 MG/ML (PF) SYRINGE
PREFILLED_SYRINGE | INTRAVENOUS | Status: DC | PRN
  Administered 2018-03-30: 50 mg via INTRAVENOUS
  Administered 2018-03-30: 10 mg via INTRAVENOUS

## 2018-03-30 MED ORDER — FENTANYL CITRATE (PF) 100 MCG/2ML IJ SOLN
INTRAMUSCULAR | Status: AC
  Filled 2018-03-30: qty 2

## 2018-03-30 MED ORDER — FENTANYL CITRATE (PF) 100 MCG/2ML IJ SOLN
INTRAMUSCULAR | Status: DC | PRN
  Administered 2018-03-30: 100 ug via INTRAVENOUS

## 2018-03-30 MED ORDER — IOPAMIDOL (ISOVUE-300) INJECTION 61%: INTRAVENOUS | Status: DC | PRN

## 2018-03-30 MED ORDER — SODIUM CHLORIDE 0.9 % IV SOLN: INTRAVENOUS | Status: DC

## 2018-03-30 MED ORDER — INDOMETHACIN 50 MG RE SUPP
RECTAL | Status: AC
  Filled 2018-03-30: qty 2

## 2018-03-30 NOTE — H&P (Signed)
GASTROENTEROLOGY OUTPATIENT PROCEDURE H&P NOTE   Primary Care Physician: [name], MD  HPI: Alexander [name] is a 67 y.o. male who presents for ERCP  Past Medical History:  Diagnosis Date  . Acute cholecystitis 03/2016   with sepsis  . Arthritis   . CAD S/P percutaneous coronary angioplasty 1995, 2000, 2005   a. 1995 s/p BMS;  b. 2000 s/p BMS;  c. 2005 s/p stent - All stents in [facility] (RCA, LAD & OM - unknown on which date);  d. 04/2015 NSTEMI/Cath: LM nl, ost/p LAD 20%, patent mLAD stent, RI small, OM2 patent stent, pRCA 20%, patent stent, EF 45-50%-->Med Rx.  . Calculus of bile duct with acute cholangitis with obstruction   . Chronic pain syndrome    a. Followed @ [facility];  b. Uses 12-14 excedrins per day.  . Essential hypertension   . Gallstones   . History of depression   . History of post poliomyelitis muscular atrophy   . HLD (hyperlipidemia)   . Ischemic cardiomyopathy    a. 04/2015 Echo: EF 40-45%, mod antsept, ant, antlat, apical HK, mild AI/MR, PASP 50mmHg.  . Migraines   . 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 NSTEMI (non-ST elevated myocardial infarction) ([place]) 04/2015   - Demand Ischemia in setting of Sepsis (also with prior MI history  . OSA (obstructive sleep apnea)    does not want to use CPAP, using mouth guard  . PAF (paroxysmal atrial fibrillation) ([place])    a. 04/2015 in setting of cholecystits and sepsis -->Amio;  b. CHA2DS2VASc = 4--> Eliquis.  . Pneumonia 04/2015  . Post-polio syndrome    a. ambulates with braces.  . Shoulder pain, right    Past Surgical History:  Procedure Laterality Date  . BILIARY STENT PLACEMENT  01/08/2018   Procedure: BILIARY STENT PLACEMENT;  Surgeon: Jackquline Denmark, MD;  Location: [facility] ENDOSCOPY;  Service: Endoscopy;;  . BILIARY STENT PLACEMENT N/A 02/09/2018   Procedure: BILIARY STENT PLACEMENT;  Surgeon: Irving Copas., MD;  Location: [facility] ENDOSCOPY;  Service: Gastroenterology;  Laterality:  N/A;  . CARDIAC CATHETERIZATION N/A 04/30/2015   Procedure: Left Heart Cath and Coronary Angiography;  Surgeon: Troy Sine, MD;  Location: [place] CV LAB;  Service: Cardiovascular; LM nl, ost/p LAD 20%, patent mLAD stent, RI small, OM2 patent stent, pRCA 20%, patent stent, EF 45-50%-->Med Rx   . cervical spine stimulator  08/2012  . CHOLECYSTECTOMY  06/10/2015   Procedure: LAPAROSCOPIC CHOLECYSTECTOMY;  Surgeon: Mickeal Skinner, MD;  Location: [place];  Service: General;;  . COLONOSCOPY  09/2013   TA x2, rpt 5 yrs ([place])  . CORONARY ANGIOPLASTY WITH STENT PLACEMENT  1995, 2000, 2005   stents in RCA, LAD & Cx-OM. -Stents were patent by cardiac catheterization in November 2016  . ENDOSCOPIC RETROGRADE CHOLANGIOPANCREATOGRAPHY (ERCP) WITH PROPOFOL N/A 02/09/2018   Procedure: ENDOSCOPIC RETROGRADE CHOLANGIOPANCREATOGRAPHY (ERCP) WITH PROPOFOL;  Surgeon: Rush Landmark Telford Nab., MD;  Location: [facility] ENDOSCOPY;  Service: Gastroenterology;  Laterality: N/A;  . ERCP N/A 01/08/2018   Procedure: ENDOSCOPIC RETROGRADE CHOLANGIOPANCREATOGRAPHY (ERCP);  Surgeon: Jackquline Denmark, MD;  Location: [facility] ENDOSCOPY;  Service: Endoscopy;  Laterality: N/A;  . lumbar spine stimulator    . SPHINCTEROTOMY  01/08/2018   Procedure: SPHINCTEROTOMY;  Surgeon: Jackquline Denmark, MD;  Location: [place];  Service: Endoscopy;;  . Joan Mayans  02/09/2018   Procedure: Joan Mayans;  Surgeon: Mansouraty, Telford Nab., MD;  Location: [facility] ENDOSCOPY;  Service: Gastroenterology;;  . Lavell Islam  REMOVAL  02/09/2018   Procedure: STENT REMOVAL;  Surgeon: Irving Copas., MD;  Location: [facility] ENDOSCOPY;  Service: Gastroenterology;;  . TRANSTHORACIC ECHOCARDIOGRAM  04/2015   EF 40-45%, mod antsept, ant, antlat, apical HK, mild AI/MR, PASP 54mmHg.  Marland Kitchen UVULOPALATOPHARYNGOPLASTY, TONSILLECTOMY AND SEPTOPLASTY  2002   Current Facility-Administered Medications  Medication Dose Route Frequency Provider Last Rate Last Dose  . 0.9 %  sodium  chloride infusion   Intravenous Continuous Mansouraty, Telford Nab., MD      . lactated ringers infusion   Intravenous Continuous Mansouraty, Telford Nab., MD 125 mL/hr at 03/30/18 [phone]     No Known Allergies Family History  Problem Relation Age of Onset  . Diabetes Mother   . Heart disease Mother   . Cancer Sister 65       male cancer  . Breast cancer Brother 43  . Hypertension Maternal Aunt   . Coronary artery disease Neg Hx   . Stroke Neg Hx   . Colon cancer Neg Hx   . Pancreatic cancer Neg Hx   . Stomach cancer Neg Hx    Social History   Socioeconomic History  . Marital status: Married    Spouse name: Not on file  . Number of children: 3  . Years of education: Not on file  . Highest education level: Not on file  Occupational History  . Occupation: retired  Scientific laboratory technician  . Financial resource strain: Not on file  . Food insecurity:    Worry: Not on file    Inability: Not on file  . Transportation needs:    Medical: Not on file    Non-medical: Not on file  Tobacco Use  . Smoking status: Former Smoker    Types: Cigarettes    Last attempt to quit: 1972    Years since quitting: 47.8  . Smokeless tobacco: Never Used  . Tobacco comment: was very light  Substance and Sexual Activity  . Alcohol use: Yes    Alcohol/week: 0.0 standard drinks    Comment: 0-1 per day  . Drug use: No  . Sexual activity: Yes  Lifestyle  . Physical activity:    Days per week: Not on file    Minutes per session: Not on file  . Stress: Not on file  Relationships  . Social connections:    Talks on phone: Not on file    Gets together: Not on file    Attends religious service: Not on file    Active member of club or organization: Not on file    Attends meetings of clubs or organizations: Not on file    Relationship status: Not on file  . Intimate partner violence:    Fear of current or ex partner: Not on file    Emotionally abused: Not on file    Physically abused: Not on file     Forced sexual activity: Not on file  Other Topics Concern  . 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 BMET No results for input(s): NA, K, CL, CO2, GLUCOSE, BUN, CREATININE, CALCIUM in the last 72 hours. LFT No results for input(s): PROT, ALBUMIN, AST, ALT, ALKPHOS, BILITOT, BILIDIR, IBILI in the last 72 hours. PT/INR No results for input(s): LABPROT, INR in the last 72 hours.   Impression / Plan: This is a 67 y.o.male who presents for ERCP.  The risks and benefits of endoscopic evaluation were discussed with the patient; these include but are not limited to the risk of perforation, infection, bleeding, missed lesions, lack of diagnosis, severe illness requiring hospitalization, as well as anesthesia and sedation related illnesses.  The patient is agreeable to proceed.    Justice Britain, MD [facility] Office # [phone]

## 2018-03-30 NOTE — Anesthesia Preprocedure Evaluation (Signed)
Anesthesia Evaluation  Patient identified by MRN, date of birth, ID band Patient awake    Reviewed: Allergy & Precautions, NPO status , Patient's Chart, lab work & pertinent test results  History of Anesthesia Complications Negative for: history of anesthetic complications  Airway Mallampati: II  TM Distance: >3 FB Neck ROM: Full    Dental  (+) Partial Lower, Partial Upper, Poor Dentition, Chipped   Pulmonary sleep apnea , former smoker,    breath sounds clear to auscultation       Cardiovascular hypertension, Pt. on medications and Pt. on home beta blockers (-) angina+ CAD, + Past MI and + Cardiac Stents   Rhythm:Regular     Neuro/Psych  Headaches, negative psych ROS   GI/Hepatic Neg liver ROS, GERD  Medicated,Choledocholithiasis   Endo/Other  negative endocrine ROS  Renal/GU negative Renal ROS     Musculoskeletal  (+) Arthritis ,   Abdominal   Peds  Hematology   Anesthesia Other Findings   Reproductive/Obstetrics                             Anesthesia Physical Anesthesia Plan  ASA: III  Anesthesia Plan: General   Post-op Pain Management:    Induction: Intravenous  PONV Risk Score and Plan: 2 and Ondansetron and Dexamethasone  Airway Management Planned: Oral ETT  Additional Equipment: None  Intra-op Plan:   Post-operative Plan: Extubation in OR  Informed Consent: I have reviewed the patients History and Physical, chart, labs and discussed the procedure including the risks, benefits and alternatives for the proposed anesthesia with the patient or authorized representative who has indicated his/her understanding and acceptance.   Dental advisory given  Plan Discussed with: CRNA and Surgeon  Anesthesia Plan Comments:         Anesthesia Quick Evaluation

## 2018-03-30 NOTE — Op Note (Signed)
[facility] Patient Name: Alexander [name] Procedure Date: 03/30/2018 MRN: [id number] Attending MD: Justice Britain , MD Date of Birth: 1951/05/13 CSN: [id number] Age: 67 Admit Type: Outpatient Procedure:                ERCP Indications:              Bile duct stone(s), Follow-up of history of                            ascending cholangitis, Biliary stent removal Providers:                Justice Britain, MD, Carlyn Reichert, RN, Tinnie Gens, Technician Referring MD:              Medicines:                General Anesthesia, Cipro 400 mg IV, Indomethacin                            333 mg PR Complications:            No immediate complications. Estimated Blood Loss:     Estimated blood loss was minimal. Procedure:                Pre-Anesthesia Assessment:                           - Prior to the procedure, a History and Physical                            was performed, and patient medications and                            allergies were reviewed. [name] tolerance of                            previous anesthesia was also reviewed. The risks                            and benefits of the procedure and the sedation                            options and risks were discussed with the patient.                            All questions were answered, and informed consent                            was obtained. Prior Anticoagulants: The patient has                            taken no previous anticoagulant or antiplatelet                            agents.  ASA Grade Assessment: III - A patient with                            severe systemic disease. After reviewing the risks                            and benefits, the patient was deemed in                            satisfactory condition to undergo the procedure.                           After obtaining informed consent, the scope was                            passed under direct  vision. Throughout the                            procedure, the patient's blood pressure, pulse, and                            oxygen saturations were monitored continuously. The                            GIF-H190 ([id number]) Olympus adult endoscope was                            introduced through the mouth, and used to inject                            contrast into and used to locate the major papilla.                            The TJF-Q180V ([id number]) Olympus ERCP was introduced                            through the mouth, and used to inject contrast into                            and used to inject contrast into the bile duct. The                            ERCP was accomplished without difficulty. The                            patient tolerated the procedure. Fluoroscopic                            imaging on Canopy. Scope In: Scope Out: Findings:      A biliary stent was visible on the scout film.      The esophagus was successfully intubated under direct vision without       detailed examination of the pharynx, larynx, and associated structures.  A standard esophagogastroduodenoscopy scope was used for the examination       of the upper gastrointestinal tract. The scope was passed under direct       vision through the upper GI tract. No gross lesions were noted in the       entire esophagus. A deformity was found in the entire examined stomach.       No gross lesions were noted in the duodenal bulb. An acquired       benign-appearing, intrinsic mild stenosis was found in the region just       proximal to the major papilla and was traversed with the endoscope. A       biliary sphincterotomy had been performed. The sphincterotomy appeared       open. One plastic biliary stent originating in the biliary tree was       emerging from the major papilla. The stent was partially occluded. One       stent was removed from the biliary tree using a snare.      A short 0.035 inch  Soft Jagwire was passed into the biliary tree. The       short-nosed traction sphincterotome was passed over the guidewire and       the bile duct was then deeply cannulated. Contrast was injected. I       personally interpreted the bile duct images. Ductal flow of contrast was       adequate. Image quality was adequate. Contrast extended to the hepatic       ducts. Opacification of the entire biliary tree except for the       gallbladder was successful. The main bile duct contained filling defects       thought to be stones and sludge. The middle third of the main bile duct       and upper third of the main bile duct were moderately dilated. The       largest diameter was 14 mm. The lower third of the main bile duct       contained a single mild narrowing approximately 20 mm in length. Major       papilla was successfully dilated with a 03-12-11 mm balloon (to a       maximum balloon size of 11 mm) dilator as a sphincteroplasty. The       biliary tree was swept with a retrieval balloon starting at the distal       bile duct and moving up towards the bifurcation. Significant biliary       sludge was swept from the duct. Many stones were removed. No stones       remained. A fully inflated retrieval balloon was able to pass throughout       the biliary tree. An occlusion cholangiogram was performed that showed a       lower third narrowing as noted above. Dilation of the common bile duct       with a 03-12-11 mm balloon (to a maximum balloon size of 10 mm) dilator       was successful. Cells for cytology were obtained by brushing in the       lower third of the main bile duct.      A pancreatogram was not performed.      The duodenoscope was withdrawn from the patient. Impression:               - No gross lesions in esophagus.                           -  J-shaped deformity in the entire stomach.                           - No gross lesions in the duodenal bulb.                           -  Acquired duodenal stenosis.                           - Prior biliary sphincterotomy appeared open.                           - One partially occluded stent from the biliary                            tree was seen in the major papilla. This stent was                            removed.                           - A filling defect consistent with a stone and                            sludge was seen on the cholangiogram.                           - A mild narrowing was found in the lower third of                            the main bile duct. The stricture was inflammatory                            appearing. This region was able to be traversed                            with a fully inflated retrieval balloon. This area                            was dilated and then brushed for cells for cytology                            evaluation.                           - The upper third of the main bile duct and middle                            third of the main bile duct were moderately dilated.                           - Choledocholithiasis and biliary sludge/debris was  found. Complete removal was accomplished by balloon                            sphincteroplasty and then significant balloon                            sweeping. Moderate Sedation:      Not Applicable - Patient had care per Anesthesia. Recommendation:           - The patient will be observed post-procedure,                            until all discharge criteria are met.                           - Discharge patient to home.                           - Patient has a contact number available for                            emergencies. The signs and symptoms of potential                            delayed complications were discussed with the                            patient. Return to normal activities tomorrow.                            Written discharge instructions were provided to the                             patient.                           - Observe patient's clinical course.                           - Watch for pancreatitis, bleeding, perforation,                            and cholangitis.                           - Ciprofloxacin 500 mg twice daily for 3-days total                            (Rx sent to pharmacy) due to extent of sludge that                            was removed and number of pulls needed to remove                            debris/sludge/choledocholithiasis.                           -  Would plan for patient to have an EGD in 6-weeks                            to re-evaluate the region of the stricture and if                            still present obtain biopsies and dilate the region                            if necessary.                           - The findings and recommendations were discussed                            with the patient.                           - The findings and recommendations were discussed                            with the patient's family. Procedure Code(s):        --- Professional ---                           [phone], Endoscopic retrograde                            cholangiopancreatography (ERCP); with removal of                            foreign body(s) or stent(s) from biliary/pancreatic                            duct(s)                           43264, Endoscopic retrograde                            cholangiopancreatography (ERCP); with removal of                            calculi/debris from biliary/pancreatic duct(s) Diagnosis Code(s):        --- Professional ---                           K80.31, Calculus of bile duct with cholangitis,                            unspecified, with obstruction                           K31.89, Other diseases of stomach and duodenum                           K31.5, Obstruction of  duodenum                           T85.590A, Other mechanical complication of bile                             duct prosthesis, initial encounter                           Z46.59, Encounter for fitting and adjustment of                            other gastrointestinal appliance and device                           K83.09, Other cholangitis                           K83.8, Other specified diseases of biliary tract                           R93.2, Abnormal findings on diagnostic imaging of                            liver and biliary tract CPT copyright 2018 [employer]. All rights reserved. The codes documented in this report are preliminary and upon coder review may  be revised to meet current compliance requirements. Justice Britain, MD 03/30/2018 1:05:25 PM Number of Addenda: 0

## 2018-03-30 NOTE — Anesthesia Procedure Notes (Signed)
Procedure Name: Intubation Date/Time: 03/30/2018 11:08 AM Performed by: Montel Clock, CRNA Pre-anesthesia Checklist: Patient identified, Emergency Drugs available, Suction available, Patient being monitored and Timeout performed Patient Re-evaluated:Patient Re-evaluated prior to induction Oxygen Delivery Method: Circle system utilized Preoxygenation: Pre-oxygenation with 100% oxygen Induction Type: IV induction Ventilation: Mask ventilation without difficulty Laryngoscope Size: Mac and 3 Grade View: Grade I Tube type: Oral Tube size: 7.5 mm Number of attempts: 1 Airway Equipment and Method: Stylet Placement Confirmation: ETT inserted through vocal cords under direct vision,  positive ETCO2 and breath sounds checked- equal and bilateral Secured at: 23 cm Tube secured with: Tape Dental Injury: Teeth and Oropharynx as per pre-operative assessment

## 2018-03-30 NOTE — Discharge Instructions (Signed)
Endoscopic Retrograde Cholangiopancreatogram, Care After °This sheet gives you information about how to care for yourself after your procedure. Your health care provider may also give you more specific instructions. If you have problems or questions, contact your health care provider. °What can I expect after the procedure? °After the procedure, it is common to have: °· Soreness in your throat. °· Nausea. °· Bloating. °· Dizziness. °· Tiredness (fatigue). ° °Follow these instructions at home: °· Take over-the-counter and prescription medicines only as told by your health care provider. °· Do not drive for 24 hours if you were given a medicine to help you relax (sedative) during your procedure. Have someone stay with you for 24 hours after the procedure. °· Return to your normal activities as told by your health care provider. Ask your health care provider what activities are safe for you. °· Return to eating what you normally do as soon as you feel well enough or as told by your health care provider. °· Keep all follow-up visits as told by your health care provider. This is important. °Contact a health care provider if: °· You have pain in your abdomen that does not get better with medicine. °· You develop signs of infection, such as: °? Chills. °? Feeling unwell. °Get help right away if: °· You have difficulty swallowing. °· You have worsening pain in your throat, chest, or abdomen. °· You vomit bright red blood or a substance that looks like coffee grounds. °· You have bloody or very black stools. °· You have a fever. °· You have a sudden increase in swelling (bloating) in your abdomen. °Summary °· After the procedure, it is common to feel tired and to have some discomfort in your throat. °· Contact your health care provider if you have signs of infection--such as chills or feeling unwell--or if you have pain that does not improve with medicine. °· Get help right away if you have trouble swallowing, worsening  pain, bloody or black vomit, bloody or black stools, a fever, or increased swelling in your abdomen. °· Keep all follow-up visits as told by your health care provider. This is important. °This information is not intended to replace advice given to you by your health care provider. Make sure you discuss any questions you have with your health care provider. °Document Released: 03/08/2013 Document Revised: 04/06/2016 Document Reviewed: 04/06/2016 °Elsevier Interactive Patient Education © 2017 [employer] ° °

## 2018-03-30 NOTE — Transfer of Care (Signed)
Immediate Anesthesia Transfer of Care Note  Patient: Alexander [name]  Procedure(s) Performed: ENDOSCOPIC RETROGRADE CHOLANGIOPANCREATOGRAPHY (ERCP) WITH PROPOFOL (N/A ) STENT REMOVAL REMOVAL OF STONES BILIARY DILATION BILIARY BRUSHING  Patient Location: Endoscopy Unit  Anesthesia Type:General  Level of Consciousness: awake, alert , oriented and patient cooperative  Airway & Oxygen Therapy: Patient Spontanous Breathing and Patient connected to face mask  Post-op Assessment: Report given to RN and Post -op Vital signs reviewed and stable  Post vital signs: Reviewed and stable  Last Vitals:  Vitals Value Taken Time  BP 143/90 03/30/2018 12:50 PM  Temp    Pulse 97 03/30/2018 12:51 PM  Resp 14 03/30/2018 12:51 PM  SpO2 100 % 03/30/2018 12:51 PM  Vitals shown include unvalidated device data.  Last Pain:  Vitals:   03/30/18 0908  TempSrc: Oral         Complications: No apparent anesthesia complications

## 2018-03-31 ENCOUNTER — Telehealth: Payer: Self-pay

## 2018-03-31 ENCOUNTER — Encounter (HOSPITAL_COMMUNITY): Payer: Self-pay | Admitting: Gastroenterology

## 2018-03-31 DIAGNOSIS — R7989 Other specified abnormal findings of blood chemistry: Secondary | ICD-10-CM

## 2018-03-31 DIAGNOSIS — R945 Abnormal results of liver function studies: Secondary | ICD-10-CM

## 2018-03-31 DIAGNOSIS — K8033 Calculus of bile duct with acute cholangitis with obstruction: Secondary | ICD-10-CM

## 2018-03-31 NOTE — Telephone Encounter (Signed)
-----   Message from Irving Copas., MD sent at 03/31/2018  4:56 AM EDT ----- Merrie Roof, finally got those sludge balls/stones out (took many, many, many, many balloon sweeps and enlarging sphincteroplasty even further. Slight caliber change in distal duct which I suspect is a result of his stone/sludge burden but brushings taken. For his follow up they wanted to be seen in [place] since it was closer to them. Steffany Schoenfelder, we will see the follow up of the brushings, but let's plan a follow up in clinic with me in 3-4 weeks. He should have a HFP drawn next week to ensure LFTs are good. May consider repeat EGD with dilation and biopsies, and depending on things consider repeat MRI imaging in the future. Alexander [name]

## 2018-03-31 NOTE — Telephone Encounter (Signed)
Mansouraty, Telford Nab., MD  Jackquline Denmark, MD; Timothy Lasso, RN        Merrie Roof, finally got those sludge balls/stones out (took many, many, many, many balloon sweeps and enlarging sphincteroplasty even further.  Slight caliber change in distal duct which I suspect is a result of his stone/sludge burden but brushings taken.  For his follow up they wanted to be seen in [place] since it was closer to them.  [place], we will see the follow up of the brushings, but let's plan a follow up in clinic with me in 3-4 weeks.  He should have a HFP drawn next week to ensure LFTs are good.  May consider repeat EGD with dilation and biopsies, and depending on things consider repeat MRI imaging in the future.  Chester Holstein

## 2018-03-31 NOTE — Telephone Encounter (Signed)
appt made and lab order in Epic sent to pt via Mychart

## 2018-04-05 ENCOUNTER — Telehealth: Payer: Self-pay | Admitting: Gastroenterology

## 2018-04-05 NOTE — Telephone Encounter (Signed)
Dr Rush Landmark did you call the pt?

## 2018-04-05 NOTE — Telephone Encounter (Signed)
Pt called stating that he was returning Dr. Donneta Romberg call regarding his path results.

## 2018-04-06 ENCOUNTER — Encounter: Payer: Self-pay | Admitting: Gastroenterology

## 2018-04-06 NOTE — Telephone Encounter (Signed)
I called and spoke with patient this AM about the results of the brushings. He is fatigued but otherwise doing well without evidence of jaundice or pruritus or fevers. Plan to see his LFTs that are going to be drawn later this week. Also plan to proceed with follow up in clinic. We will determine the potential need for follow up cross-sectional imaging vs EUS to ensure not missing anything as well as to relook potentially at the duodenal stenosis. Patient appreciative for callback.  Justice Britain, MD [facility] Endoscopy Office # [phone]

## 2018-04-06 NOTE — Telephone Encounter (Signed)
I did call, however, I hadn't left a message yet. I'll reach him later today (11/6). Thanks.

## 2018-04-08 ENCOUNTER — Other Ambulatory Visit (INDEPENDENT_AMBULATORY_CARE_PROVIDER_SITE_OTHER): Payer: Medicare Other

## 2018-04-08 DIAGNOSIS — R945 Abnormal results of liver function studies: Secondary | ICD-10-CM

## 2018-04-08 DIAGNOSIS — K8033 Calculus of bile duct with acute cholangitis with obstruction: Secondary | ICD-10-CM | POA: Diagnosis not present

## 2018-04-08 DIAGNOSIS — R7989 Other specified abnormal findings of blood chemistry: Secondary | ICD-10-CM

## 2018-04-08 LAB — HEPATIC FUNCTION PANEL
ALK PHOS: 70 U/L (ref 39–117)
ALT: 21 U/L (ref 0–53)
AST: 16 U/L (ref 0–37)
Albumin: 4.3 g/dL (ref 3.5–5.2)
Bilirubin, Direct: 0.1 mg/dL (ref 0.0–0.3)
TOTAL PROTEIN: 7.4 g/dL (ref 6.0–8.3)
Total Bilirubin: 0.5 mg/dL (ref 0.2–1.2)

## 2018-04-12 NOTE — Anesthesia Postprocedure Evaluation (Signed)
Anesthesia Post Note  Patient: Alexander [name]  Procedure(s) Performed: ENDOSCOPIC RETROGRADE CHOLANGIOPANCREATOGRAPHY (ERCP) WITH PROPOFOL (N/A ) STENT REMOVAL REMOVAL OF STONES BILIARY DILATION BILIARY BRUSHING ESOPHAGOGASTRODUODENOSCOPY (EGD) (N/A )     Patient location during evaluation: PACU Anesthesia Type: General Level of consciousness: awake and alert Pain management: pain level controlled Vital Signs Assessment: post-procedure vital signs reviewed and stable Respiratory status: spontaneous breathing, nonlabored ventilation, respiratory function stable and patient connected to nasal cannula oxygen Cardiovascular status: blood pressure returned to baseline and stable Postop Assessment: no apparent nausea or vomiting Anesthetic complications: no    Last Vitals:  Vitals:   03/30/18 1300 03/30/18 1310  BP: 118/85 137/85  Pulse: 85 76  Resp: 16 15  Temp:    SpO2: 100% 97%    Last Pain:  Vitals:   03/30/18 1252  TempSrc: Oral                 [name]

## 2018-04-25 ENCOUNTER — Other Ambulatory Visit: Payer: Self-pay | Admitting: Family Medicine

## 2018-05-02 ENCOUNTER — Ambulatory Visit (INDEPENDENT_AMBULATORY_CARE_PROVIDER_SITE_OTHER): Payer: Medicare Other | Admitting: Gastroenterology

## 2018-05-02 ENCOUNTER — Other Ambulatory Visit (INDEPENDENT_AMBULATORY_CARE_PROVIDER_SITE_OTHER): Payer: Medicare Other

## 2018-05-02 ENCOUNTER — Encounter: Payer: Self-pay | Admitting: Gastroenterology

## 2018-05-02 VITALS — BP 108/78 | HR 72 | Ht 69.0 in | Wt 220.0 lb

## 2018-05-02 DIAGNOSIS — K315 Obstruction of duodenum: Secondary | ICD-10-CM

## 2018-05-02 DIAGNOSIS — R194 Change in bowel habit: Secondary | ICD-10-CM | POA: Diagnosis not present

## 2018-05-02 DIAGNOSIS — K8050 Calculus of bile duct without cholangitis or cholecystitis without obstruction: Secondary | ICD-10-CM

## 2018-05-02 DIAGNOSIS — K5900 Constipation, unspecified: Secondary | ICD-10-CM

## 2018-05-02 DIAGNOSIS — Z9889 Other specified postprocedural states: Secondary | ICD-10-CM

## 2018-05-02 LAB — TSH: TSH: 0.62 u[IU]/mL (ref 0.35–4.50)

## 2018-05-02 MED ORDER — SUPREP BOWEL PREP KIT 17.5-3.13-1.6 GM/177ML PO SOLN: 1.0000 | ORAL | 0 refills | Status: DC

## 2018-05-02 NOTE — Progress Notes (Addendum)
[place] VISIT   Primary Care Provider [name], MD [place]  [id number] [phone]  Patient Profile: Alexander [name] is a 67 y.o. male with a pmh significant for CAD, HTN, pAFib, s/p CCK, hx of recent choledocholithiasis.  The patient presents to the [facility] for an evaluation and management of problem(s) noted below:  Problem List 1. Change in bowel habits   2. Choledocholithiasis   3. S/P ERCP   4. Duodenal stenosis     History of Present Illness: This patient was initially seen by Dr. Lyndel Safe a few months back but he presented with significant jaundice and subsequently was found to have choledocholithiasis as well as cystic duct stones.  He required a few ERCPs as noted below during those ERCPs he was found to have a duodenal stenosis.  Patient had liver biochemical tests checked after his most recent ERCP and they have normalized.  We scheduled a follow-up in clinic for [place] to evaluate how he was doing from a duodenal stenosis perspective but also had heard a issue with changes in bowel habits which is been longstanding since his gallbladder.  Today the patient comes in for follow-up.  The patient states he has been doing relatively well.  He has not had any jaundice or darkening urine.  The patient states that he still having periods of bloating with nausea at times.  He had an approximately 10 pound weight loss over the course of the last few months but that has been relatively stable since his initial loss of weight when he got sick earlier this fall.  The patient is now trying to maintain his current weight because he thinks the weight loss has been good for him.  At times the patient feels a sensation of food stuck still being present in his stomach a few hours after eating.  He does not feel true early satiety because he is able to continue eating.  However he seems to have modified unintentionally by  trying to eat smaller meals more frequently.  And this is corroborated by his wife.  The patient is not taking any nonsteroidals.  He is currently taking his PPI therapy.  In regards to the patient's diarrhea he describes a longstanding history of loose stools alternating with constipation at times.  However most the time he has normal bowel movements.  Interestingly, when the patient has been treated with antibiotics over the course of the last few months for initially the concern for biliary obstruction and then subsequently which were given after each of these ERCPs he ended up having improvement in the bloating and distention and the looseness of the stools for period of time.  Over the course of the last few weeks since his last ERCP he is done okay.  He wonders whether there is some correlation with antibiotics being given and some of his improvements that he has at times.  His stools are at times able to float.  He is not noted overt hematochezia or melena.  He had a colonoscopy in 2015 with 2 tubular adenomas found.  GI Review of Systems Positive as above including very minimal solid food dysphagia Negative for odynophagia, jaundice, increased eructation, tenesmus, urgency, hematochezia  Review of Systems General: Denies fevers/chills HEENT: Denies oral lesions Cardiovascular: Denies chest pain Pulmonary: Shortness of breath at baseline Gastroenterological: See HPI Genitourinary: Denies darkened urin Endocrine: Denies temperature intolerance Dermatological: Denies jaundice Psychological: Mood is stable Musculoskeletal: Denies new arthralgias  Medications Current Outpatient Medications  Medication Sig Dispense Refill  . acetaminophen (TYLENOL) 500 MG tablet Take 1,000 mg by mouth every 6 (six) hours as needed for moderate pain.    . Ascorbic Acid (VITAMIN C) 1000 MG tablet Take 1,000 mg by mouth daily.    Marland Kitchen atorvastatin (LIPITOR) 80 MG tablet Take 0.5 tablets (40 mg total) daily at 6  PM by mouth. 90 tablet 3  . Calcium Carb-Cholecalciferol (HM CALCIUM-VITAMIN D) 600-800 MG-UNIT TABS Take 1 tablet by mouth daily.    Marland Kitchen gabapentin (NEURONTIN) 600 MG tablet TAKE 2 TABLETS 3 TIMES A DAY (Patient taking differently: Take 1,200 mg by mouth 3 (three) times daily. ) 540 tablet 3  . lisinopril (PRINIVIL,ZESTRIL) 2.5 MG tablet TAKE 1 TABLET BY MOUTH EVERY DAY 90 tablet 1  . Magnesium 250 MG TABS Take 1 tablet (250 mg total) by mouth at bedtime. (Patient taking differently: Take 250 mg by mouth at bedtime as needed (cramps). )  0  . Multiple Vitamin (MULTIVITAMIN WITH MINERALS) TABS tablet Take 1 tablet by mouth daily.    . Omega-3 Fatty Acids (FISH OIL) 1000 MG CAPS Take 1,000 mg by mouth daily.     Marland Kitchen omeprazole (PRILOSEC) 40 MG capsule Take 1 capsule (40 mg total) by mouth daily. 90 capsule 0  . sildenafil (VIAGRA) 100 MG tablet TAKE ONE-HALF TO ONE TABLET BY MOUTH DAILY AS NEEDED FOR ERECTILE DYSFUNCTION (Patient taking differently: Take 50-100 mg by mouth daily as needed for erectile dysfunction. ) 6 tablet 0  . vitamin B-12 (CYANOCOBALAMIN) 1000 MCG tablet Take 1,000 mcg by mouth daily.    . vitamin E 400 UNIT capsule Take 400 Units by mouth daily.    Marland Kitchen aspirin 81 MG chewable tablet Chew 1 tablet (81 mg total) by mouth daily. (Patient not taking: Reported on 05/02/2018) 30 tablet 0  . SUPREP BOWEL PREP KIT 17.5-3.13-1.6 GM/177ML SOLN Take 1 kit by mouth as directed. For colonoscopy prep 2 Bottle 0   No current facility-administered medications for this visit.     Allergies No Known Allergies  Histories Past Medical History:  Diagnosis Date  . Acute cholecystitis 03/2016   with sepsis  . Arthritis   . CAD S/P percutaneous coronary angioplasty 1995, 2000, 2005   a. 1995 s/p BMS;  b. 2000 s/p BMS;  c. 2005 s/p stent - All stents in [facility] (RCA, LAD & OM - unknown on which date);  d. 04/2015 NSTEMI/Cath: LM nl, ost/p LAD 20%, patent mLAD stent, RI small, OM2 patent stent, pRCA  20%, patent stent, EF 45-50%-->Med Rx.  . Calculus of bile duct with acute cholangitis with obstruction   . Chronic pain syndrome    a. Followed @ [facility];  b. Uses 12-14 excedrins per day.  . Essential hypertension   . Gallstones   . History of depression   . History of post poliomyelitis muscular atrophy   . HLD (hyperlipidemia)   . Ischemic cardiomyopathy    a. 04/2015 Echo: EF 40-45%, mod antsept, ant, antlat, apical HK, mild AI/MR, PASP 19mHg.  . Migraines   . 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 NSTEMI (non-ST elevated myocardial infarction) ([facility] 04/2015   - [place] Ischemia in setting of Sepsis (also with prior MI history  . OSA (obstructive sleep apnea)    does not want to use CPAP, using mouth guard  . PAF (paroxysmal atrial fibrillation) ([facility]    a. 04/2015 in setting of cholecystits  and sepsis -->Amio;  b. CHA2DS2VASc = 4--> Eliquis.  . Pneumonia 04/2015  . Post-polio syndrome    a. ambulates with braces.  . Shoulder pain, right    Past Surgical History:  Procedure Laterality Date  . BILIARY STENT PLACEMENT  01/08/2018   Procedure: BILIARY STENT PLACEMENT;  Surgeon: Jackquline Denmark, MD;  Location: [facility] ENDOSCOPY;  Service: Endoscopy;;  . BILIARY STENT PLACEMENT N/A 02/09/2018   Procedure: BILIARY STENT PLACEMENT;  Surgeon: Irving Copas., MD;  Location: [facility]OSCOPY;  Service: Gastroenterology;  Laterality: N/A;  . CARDIAC CATHETERIZATION N/A 04/30/2015   Procedure: Left Heart Cath and Coronary Angiography;  Surgeon: Troy Sine, MD;  Location: [place] CV LAB;  Service: Cardiovascular; LM nl, ost/p LAD 20%, patent mLAD stent, RI small, OM2 patent stent, pRCA 20%, patent stent, EF 45-50%-->Med Rx   . cervical spine stimulator  08/2012  . CHOLECYSTECTOMY  06/10/2015   Procedure: LAPAROSCOPIC CHOLECYSTECTOMY;  Surgeon: Mickeal Skinner, MD;  Location: [place];  Service: General;;  . COLONOSCOPY  09/2013   TA x2, rpt 5 yrs ([facility])  . CORONARY  ANGIOPLASTY WITH STENT PLACEMENT  1995, 2000, 2005   stents in RCA, LAD & Cx-OM. -Stents were patent by cardiac catheterization in November 2016  . ENDOSCOPIC RETROGRADE CHOLANGIOPANCREATOGRAPHY (ERCP) WITH PROPOFOL N/A 02/09/2018   Procedure: ENDOSCOPIC RETROGRADE CHOLANGIOPANCREATOGRAPHY (ERCP) WITH PROPOFOL;  Surgeon: Rush Landmark Telford Nab., MD;  Location: [facility] ENDOSCOPY;  Service: Gastroenterology;  Laterality: N/A;  . ENDOSCOPIC RETROGRADE CHOLANGIOPANCREATOGRAPHY (ERCP) WITH PROPOFOL N/A 03/30/2018   Procedure: ENDOSCOPIC RETROGRADE CHOLANGIOPANCREATOGRAPHY (ERCP) WITH PROPOFOL;  Surgeon: Rush Landmark Telford Nab., MD;  Location: [facility] ENDOSCOPY;  Service: Gastroenterology;  Laterality: N/A;  . ERCP N/A 01/08/2018   Procedure: ENDOSCOPIC RETROGRADE CHOLANGIOPANCREATOGRAPHY (ERCP);  Surgeon: Jackquline Denmark, MD;  Location: [facility] ENDOSCOPY;  Service: Endoscopy;  Laterality: N/A;  . ESOPHAGOGASTRODUODENOSCOPY N/A 03/30/2018   Procedure: ESOPHAGOGASTRODUODENOSCOPY (EGD);  Surgeon: Irving Copas., MD;  Location: [facility];  Service: Gastroenterology;  Laterality: N/A;  . lumbar spine stimulator    . REMOVAL OF STONES  03/30/2018   Procedure: REMOVAL OF STONES;  Surgeon: Rush Landmark Telford Nab., MD;  Location: [facility];  Service: Gastroenterology;;  . Joan Mayans  01/08/2018   Procedure: Joan Mayans;  Surgeon: Jackquline Denmark, MD;  Location: [place];  Service: Endoscopy;;  . Joan Mayans  02/09/2018   Procedure: Joan Mayans;  Surgeon: Mansouraty, Telford Nab., MD;  Location: [facility]OSCOPY;  Service: Gastroenterology;;  . Lavell Islam REMOVAL  02/09/2018   Procedure: STENT REMOVAL;  Surgeon: Irving Copas., MD;  Location: [facility];  Service: Gastroenterology;;  . Lavell Islam REMOVAL  03/30/2018   Procedure: STENT REMOVAL;  Surgeon: Irving Copas., MD;  Location: [facility] ENDOSCOPY;  Service: Gastroenterology;;  . TRANSTHORACIC ECHOCARDIOGRAM  04/2015   EF 40-45%, mod antsept, ant,  antlat, apical HK, mild AI/MR, PASP 66mHg.  .Marland KitchenUVULOPALATOPHARYNGOPLASTY, TONSILLECTOMY AND SEPTOPLASTY  2002   Social History   Socioeconomic History  . Marital status: Married    Spouse name: Not on file  . Number of children: 3  . Years of education: Not on file  . Highest education level: Not on file  Occupational History  . Occupation: retired  SScientific laboratory technician . Financial resource strain: Not on file  . Food insecurity:    Worry: Not on file    Inability: Not on file  . Transportation needs:    Medical: Not on file    Non-medical: Not on file  Tobacco Use  . Smoking status: Former Smoker  Types: Cigarettes    Last attempt to quit: 1972    Years since quitting: 47.9  . Smokeless tobacco: Never Used  . Tobacco comment: was very light  Substance and Sexual Activity  . Alcohol use: Yes    Alcohol/week: 0.0 standard drinks    Comment: 0-1 per day  . Drug use: No  . Sexual activity: Yes  Lifestyle  . Physical activity:    Days per week: Not on file    Minutes per session: Not on file  . Stress: Not on file  Relationships  . Social connections:    Talks on phone: Not on file    Gets together: Not on file    Attends religious service: Not on file    Active member of club or organization: Not on file    Attends meetings of clubs or organizations: Not on file    Relationship status: Not on file  . Intimate partner violence:    Fear of current or ex partner: Not on file    Emotionally abused: Not on file    Physically abused: Not on file    Forced sexual activity: Not on file  Other Topics Concern  . 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 Diabetes Mother   . Heart disease Mother   . Cancer Sister 32       male cancer  . Breast cancer Brother 29  . Hypertension Maternal Aunt   . Coronary artery disease Neg Hx   . Stroke Neg Hx   . Colon cancer Neg Hx   . Pancreatic cancer Neg Hx   . Stomach cancer Neg Hx   . Esophageal cancer Neg Hx   . Inflammatory bowel disease Neg Hx   . Liver disease Neg Hx   . Rectal cancer Neg Hx    I have reviewed his medical, social, and family history in detail and updated the electronic medical record as necessary.    PHYSICAL EXAMINATION  BP 108/78 (BP Location: Left Arm, Patient Position: Sitting, Cuff Size: Normal)   Pulse 72   Ht '5\' 9"'$  (1.753 m)   Wt 220 lb (99.8 kg) Comment: with braces  BMI 32.49 kg/m   Wt Readings from Last 3 Encounters:  05/02/18 220 lb (99.8 kg)  03/30/18 197 lb (89.4 kg)  02/09/18 195 lb (88.5 kg)  GEN: NAD, appears stated age, doesn't appear chronically ill, appears most well since I seen him for 2 prior procedures, accompanied by wife PSYCH: Cooperative, without pressured speech EYE: Conjunctivae pink, sclerae anicteric ENT: MMM CV: RR without R/Gs  RESP: Decreased breath sounds at the bases bilaterally GI: NABS, soft, NT/ND, surgical scars appreciated, without rebound or guarding, no HSM appreciated MSK/EXT: Very minimal lower extremity edema bilaterally SKIN: No jaundice NEURO:  Alert & Oriented x 3, no focal deficits   REVIEW OF DATA  I reviewed the following data at the time of this encounter:  GI Procedures and Studies  03/30/18 - No gross lesions in esophagus. - J-shaped deformity in the entire stomach. - No gross lesions in the duodenal bulb. - Acquired duodenal  stenosis. - Prior biliary sphincterotomy appeared open. - One partially occluded stent from the biliary tree was seen in the major papilla. This stent was removed. - A filling defect consistent with a stone and sludge was seen on the cholangiogram. - A mild narrowing  was found in the lower third of the main bile duct. The stricture was inflammatory appearing. This region was able to be traversed with a fully inflated retrieval balloon. This area was dilated and then brushed for cells for cytology evaluation. - The upper third of the main bile duct and middle third of the main bile duct were moderately dilated. - Choledocholithiasis and biliary sludge/debris was found. Complete removal was accomplished by balloon sphincteroplasty and then significant balloon sweeping.  May 2015 colonoscopy 2 sessile polyps measuring 5 to 7 mm were found at the hepatic flexure.  Polypectomy performed.  A sessile polyp measuring 34 mm in size was found in the transverse colon.  Polypectomy was performed with cold forceps.   Pathology consistent with 2 polyps being adenomas 1 benign tissue with plan for a 5-year follow-up colonoscopy  Laboratory Studies  Reviewed in epic  Imaging Studies  August 2019 CT abdomen pelvis with contrast IMPRESSION: 1. Moderate common bile duct dilatation, measuring up to 17 mm. While this may be related to post cholecystectomy state, correlation with LFTs is recommended. If abnormal, recommend ERCP or MRCP for further evaluation. 2. Air within the distal common bile duct may be related to prior sphincterotomy. 3. Slightly progressed chronic interstitial lung disease. 4.  Aortic atherosclerosis (ICD10-I70.0).   ASSESSMENT  Mr. Toral is a 67 y.o. male with a pmh significant for CAD, HTN, pAFib, s/p CCK, hx of recent choledocholithiasis.  The patient is seen today for evaluation and management of:  1. Change in bowel habits   2. Choledocholithiasis   3. S/P ERCP   4. Duodenal stenosis    The patient is clinically and hemodynamically improved since his last ERCP.  He has had no evidence of recurrent choledocholithiasis.  They will alert [place] if fevers, chills, abdominal pain were to develop such that we were concerned about that.  At this  point in time he also has a known duodenal stenosis.  Previous CT imaging did not show evidence of a pancreatic mass or lesion and I suspect this is more result of his previous issues with his choledocholithiasis however we need to be very thoughtful and not make sure were missing anything.  I do think he has some mild symptoms from duodenal stenosis.  I would like to plan an upper endoscopy to dilate the region as well as to obtain biopsies.  Dependent on what the biopsies show we may also consider cross-sectional CT imaging or MR imaging to evaluate the pancreas.  His liver tests have normalized which is great.  Regards to his changes in bowel habits that he has had since his cholecystectomy I suspect that there is a component of bile salt diarrhea however things come and go.  I actually think he may actually have exocrine pancreatic insufficiency or more likely small intestine bacterial overgrowth as he has improvement in his symptoms after he has had antibiotics given.  We will move forward with trying to get breath testing to rule out bacterial overgrowth as well as proceed with stool studies to rule out EPI.  I would also like to rule out collagenous colitis or microscopic colitis and he will be due for colonoscopy in 2020 for a 5-year surveillance and we  can do his colon polyp surveillance as well.  The risks and benefits of endoscopic evaluation were discussed with the patient; these include but are not limited to the risk of perforation, infection, bleeding, missed lesions, lack of diagnosis, severe illness requiring hospitalization, as well as anesthesia and sedation related illnesses.  The patient is agreeable to proceed.    PLAN  Continue PPI at current dose Proceed with endoscopy with biopsies of stomach as well as duodenum and duodenal stricture and plan for possible dilation of duodenal stricture (best to probably start with endoscope and then go to pediatric colonoscope) Less likely to be  gastric emptying issues but will consider that in the future Laboratory work-up as below We will try to rule out SIBO as well as exocrine pancreatic insufficiency moving forward No antibiotics to be given currently. Patient and wife aware of symptoms to be on the look out for possible recurrent choledocholithiasis from cystic duct stone debris if that were to occur in the future   Orders Placed This Encounter  Procedures  . Calcium, ionized  . TSH  . Ambulatory referral to Gastroenterology    New Prescriptions   SUPREP BOWEL PREP KIT 17.5-3.13-1.6 GM/177ML SOLN    Take 1 kit by mouth as directed. For colonoscopy prep   Modified Medications   No medications on file    Planned Follow Up: No follow-ups on file.   Justice Britain, MD [facility] Office # [phone]

## 2018-05-02 NOTE — Patient Instructions (Signed)
Your provider has requested that you go to the basement level for lab work before leaving today. Press "B" on the elevator. The lab is located at the first door on the left as you exit the elevator. ---------------------------------------------------------------------------------- You have been scheduled for an endoscopy and colonoscopy. Please follow the written instructions given to you at your visit today. Please pick up your prep supplies at the pharmacy within the next 1-3 days. If you use inhalers (even only as needed), please bring them with you on the day of your procedure. Your physician has requested that you go to www.[email or web address].com and enter the access code given to you at your visit today. This web site gives a general overview about your procedure. However, you should still follow specific instructions given to you by our office regarding your preparation for the procedure. ------------------------------------------------------------------------------------ Please purchase the following medications over the counter and take as directed: Fibercon-1 tablet daily x 2 weeks, then increase to 2 tablets daily thereafter.  Miralax 17 grams dissolved in at least 8 ounces water/juice once every other day x 2 weeks. If you have a bowel movement daily, then you can titrate. Otherwise, increase dosage to 17 grams every day.  Purchase a stool softener (colace) to take once daily. --------------------------------------------------------------------------------- If you are age 71 or older, your body mass index should be between 23-30. Your Body mass index is 32.49 kg/m. If this is out of the aforementioned range listed, please consider follow up with your Primary Care Provider.  If you are age 52 or younger, your body mass index should be between 19-25. Your Body mass index is 32.49 kg/m. If this is out of the aformentioned range listed, please consider follow up with your Primary Care Provider.

## 2018-05-03 ENCOUNTER — Encounter: Payer: Self-pay | Admitting: Gastroenterology

## 2018-05-03 LAB — CALCIUM, IONIZED: Calcium, Ion: 5.09 mg/dL (ref 4.8–5.6)

## 2018-05-06 DIAGNOSIS — K8050 Calculus of bile duct without cholangitis or cholecystitis without obstruction: Secondary | ICD-10-CM | POA: Insufficient documentation

## 2018-05-06 DIAGNOSIS — Z9889 Other specified postprocedural states: Secondary | ICD-10-CM | POA: Insufficient documentation

## 2018-05-06 DIAGNOSIS — K315 Obstruction of duodenum: Secondary | ICD-10-CM | POA: Insufficient documentation

## 2018-05-06 DIAGNOSIS — K5900 Constipation, unspecified: Secondary | ICD-10-CM | POA: Insufficient documentation

## 2018-05-17 ENCOUNTER — Other Ambulatory Visit: Payer: Self-pay | Admitting: Family Medicine

## 2018-05-17 NOTE — Telephone Encounter (Signed)
Electronic refill request Sildenafil Last refill 11/03/17 #6 Last office visit 01/21/18

## 2018-06-01 HISTORY — PX: COLONOSCOPY WITH ESOPHAGOGASTRODUODENOSCOPY (EGD): SHX5779

## 2018-06-03 ENCOUNTER — Other Ambulatory Visit: Payer: Self-pay

## 2018-06-03 ENCOUNTER — Encounter (HOSPITAL_COMMUNITY): Payer: Self-pay | Admitting: *Deleted

## 2018-06-03 NOTE — Progress Notes (Signed)
Spoke with Alexander [name] and his wife, [name] for pre-op call. Alexander [name] has hx of CAD with stents and A-fib. Alexander [name]'s cardiologist is Dr. Ellyn Hack, last visit was November 2018. Alexander [name] and wife state that they know he needs to go for his yearly visit but he has been so busy going to the GI physician and having procedures done that they wanted to wait and get this all done first. Alexander [name] denies any recent chest pain or episodes of A-fib. Alexander [name] states he is not diabetic. Instructed Alexander [name] to hold his Multivitamins, Fish Oil and Vitamin E.   EKG - 01/07/18 Cath - 04/30/15

## 2018-06-03 NOTE — Progress Notes (Signed)
Anesthesia Chart Review: SAME DAY WORK-UP (ENDO CASE)  Case:  [id number] Date/Time:  06/06/18 0730   Procedures:      ESOPHAGOGASTRODUODENOSCOPY (EGD) WITH PROPOFOL (N/A )     COLONOSCOPY WITH PROPOFOL (N/A )   Anesthesia type:  Monitor Anesthesia Care   Pre-op diagnosis:  s/p ERCP, constipation, duodenal stenosis   Location:  [facility] / [facility]   Surgeon:  Mansouraty, Telford Nab., MD      DISCUSSION: Patient is a 68 year old male scheduled for the above procedure.  - Admission 01/07/18-01/11/18 for septic shock with ascending cholangitis and E. coli bacteremia, transaminitis, s/p ERCP with endobiliary stent and limited sphincterotomy 01/08/18 - ERCP 02/09/18: Acquired duodenal stenosis.  Partially occluded stent from the biliary tree was removed.  Choledocholithiasis/sludge balls with partial removal with biliary sphincterotomy extension, sphincteroplasty and balloon sweep with stent insertion. - ERCP 03/30/18: Acquired duodenal stenosis.  Partially occluded stent from the biliary tree was removed.  Biliary sphincterotomy appears open.  Choledocholithiasis with biliary sludge with complete removal by balloon sphincteroplasty and significant balloon sweeping.    At 05/02/18 GI follow-up, patient clinically improved from choledocholithiasis but likely mild symptoms from duodental stenosis and with reports of change in bowel habits. Upper endoscopy to dilate that region as well as obtain biopsies recommended. Cross sectional CT or MR will also be considered to evaluate the pancreas. Colonoscopy recommended for colon polyp surveillance as well as rule out collagenous colitis or microscopic colitis.   Other history includes former smoker (quit '72),CAD (s/p stents '95 '00 '05 in [place]; NSTEMI--demand ischemia 04/27/15 in the setting of afib with RVR and severe sepsis due to cholecystitis s/p IR drain followed by cholecystectomy 04/09/16, LHC showed no critical stenosis with patent stents at that  time), chronic systolic CHF, ischemic cardiomyopathy, GERD, HTN, poliomyelitis with postpolio syndrome (muscle atrophy in BLE), OSA (s/p UPPP '02, no CPAP, using mouth guard), non-smoker, lumbar and cervical spine stimulator, migraines.  Patient last seen by cardiology 04/2017 and was clinically stable at that time. Has since been admitted with septic shock and choledocholithiasis requiring multiple ERCP under anesthesia, last 03/2017. Last cath 04/2015 showed no significant residual CAD with patent stents. Unless with acute changes or new CV symptoms then I would anticipate that he could proceed with EGD and colonoscopy as scheduled.    PROVIDERS: [name], MD is PCP Glenetta Hew, MD is cardiologist. Last visit 04/08/17. Patient had not had recurrent afib in sick with sepsis 04/2015. He changed b-blocker to as needed (due to patient being in SR with fatigue side effects) and anticoagulation (other that ASA) discontinued. One year follow-up recommended.   LABS: Labs per GI. LFTs WNL 04/08/18. H/H, Creatinine were WNL on 01/21/18.   PFTs 08/01/15: FVC 5.42 (113%), FEV1 4.58 (129%), DLCO unc 20.37 (60%).   IMAGES: CXR 01/21/18: IMPRESSION: Chronic changes without acute abnormality.   EKG: 01/07/18: SR, short PR interval. LVH. Prolonged QT interval (QT 437, QTc 501 ms). EKG in the setting of severe sepsis. He has undergone GI procedures with anesthesia three times since then, so will defer decision to repeat EKG to assigned anesthesiologist.    CV: LHC 04/30/15 (Dr. Claiborne Billings):  Prox RCA-1 lesion, 20% stenosed.  Ost LAD to Prox LAD lesion, 20% stenosed.  There is mild left ventricular systolic dysfunction. Mild LV dysfunction with very subtle minimal mid anterolateral hypocontractility with a global ejection fraction of 45-50%. No significant residual CAD with 10-20% luminal irregularity of the proximal LAD,  and evidence for widely patent LAD stent after this region of irregularity;  dominant left circumflex coronary artery with a widely patent stent in the marginal branch; nondominant RCA with widely pain proximal stent with smooth 20% narrowing immediately proximal to the stented segment. Recommendations: The present study did not demonstrate significant residual CAD, and all stents are widely patent. Heparin will be resumed 8 hours post sheath removal in light of his atrial fibrillation and troponin elevation of 5.  Echo 04/28/15:  Study Conclusions - Left ventricle: The cavity size was normal. Systolic function was mildly to moderately reduced. The estimated ejection fraction was in the range of 40% to 45%. Probable moderate hypokinesis of the anteroseptal, anterior, anterolateral, and apical myocardium. Left ventricular diastolic function parameters were normal. - Aortic valve: There was mild regurgitation. - Mitral valve: There was mild regurgitation. - Pulmonary arteries: PA peak pressure: 31 mm Hg (S).   Past Medical History:  Diagnosis Date  . Acute cholecystitis 03/2016   with sepsis  . Arthritis   . CAD S/P percutaneous coronary angioplasty 1995, 2000, 2005   a. 1995 s/p BMS;  b. 2000 s/p BMS;  c. 2005 s/p stent - All stents in [facility] (RCA, LAD & OM - unknown on which date);  d. 04/2015 NSTEMI/Cath: LM nl, ost/p LAD 20%, patent mLAD stent, RI small, OM2 patent stent, pRCA 20%, patent stent, EF 45-50%-->Med Rx.  . Calculus of bile duct with acute cholangitis with obstruction   . Chronic pain syndrome    a. Followed @ [facility];  b. Uses 12-14 excedrins per day.  . Essential hypertension   . Gallstones   . History of depression   . History of post poliomyelitis muscular atrophy   . HLD (hyperlipidemia)   . Ischemic cardiomyopathy    a. 04/2015 Echo: EF 40-45%, mod antsept, ant, antlat, apical HK, mild AI/MR, PASP 76mHg.  . Migraines   . 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 NSTEMI (non-ST elevated myocardial infarction)  [place]   - Demand Ischemia in setting of Sepsis (also with prior MI history  . OSA (obstructive sleep apnea)    does not want to use CPAP, using mouth guard  . PAF (paroxysmal atrial fibrillation) ([place]    a. 04/2015 in setting of cholecystits and sepsis -->Amio;  b. CHA2DS2VASc = 4--> Eliquis.  . Pneumonia 04/2015  . Post-polio syndrome    a. ambulates with braces.  . Shoulder pain, right     Past Surgical History:  Procedure Laterality Date  . BILIARY STENT PLACEMENT  01/08/2018   Procedure: BILIARY STENT PLACEMENT;  Surgeon: GJackquline Denmark MD;  [facility];  Service: Endoscopy;;  . BILIARY STENT PLACEMENT N/A 02/09/2018   Procedure: BILIARY STENT PLACEMENT;  Surgeon: MIrving Copas, MD;  [facility];  Service: Gastroenterology;  Laterality: N/A;  . CARDIAC CATHETERIZATION N/A 04/30/2015   Procedure: Left Heart Cath and Coronary Angiography;  Surgeon: TTroy Sine MD;  [facility];  Service: Cardiovascular; LM nl, ost/p LAD 20%, patent mLAD stent, RI small, OM2 patent stent, pRCA 20%, patent stent, EF 45-50%-->Med Rx   . cervical spine stimulator  08/2012  . CHOLECYSTECTOMY  06/10/2015   Procedure: LAPAROSCOPIC CHOLECYSTECTOMY;  Surgeon: LMickeal Skinner MD;  [place]  Service: General;;  . COLONOSCOPY  09/2013   TA x2, rpt 5 yrs ([facility])  . [place] 2000, 2005   stents  in RCA, LAD & Cx-OM. -Stents were patent by cardiac catheterization in November 2016  . ENDOSCOPIC RETROGRADE CHOLANGIOPANCREATOGRAPHY (ERCP) WITH PROPOFOL N/A 02/09/2018   Procedure: ENDOSCOPIC RETROGRADE CHOLANGIOPANCREATOGRAPHY (ERCP) WITH PROPOFOL;  Surgeon: Rush Landmark Telford Nab., MD;  Location: [facility];  Service: Gastroenterology;  Laterality: N/A;  . ENDOSCOPIC RETROGRADE CHOLANGIOPANCREATOGRAPHY (ERCP) WITH PROPOFOL N/A 03/30/2018   Procedure: ENDOSCOPIC RETROGRADE CHOLANGIOPANCREATOGRAPHY (ERCP) WITH  PROPOFOL;  Surgeon: Rush Landmark Telford Nab., MD;  Location: [facility];  Service: Gastroenterology;  Laterality: N/A;  . ERCP N/A 01/08/2018   Procedure: ENDOSCOPIC RETROGRADE CHOLANGIOPANCREATOGRAPHY (ERCP);  Surgeon: Jackquline Denmark, MD;  [facility];  Service: Endoscopy;  Laterality: N/A;  . ESOPHAGOGASTRODUODENOSCOPY N/A 03/30/2018   Procedure: ESOPHAGOGASTRODUODENOSCOPY (EGD);  Surgeon: Irving Copas., MD;  [facility];  Service: Gastroenterology;  Laterality: N/A;  . lumbar spine stimulator    . REMOVAL OF STONES  03/30/2018   Procedure: REMOVAL OF STONES;  Surgeon: Rush Landmark Telford Nab., MD;  [facility];  Service: Gastroenterology;;  . Joan Mayans  01/08/2018   Procedure: Joan Mayans;  Surgeon: Jackquline Denmark, MD;  Location: [facility];  Service: Endoscopy;;  . Joan Mayans  02/09/2018   Procedure: Joan Mayans;  Surgeon: Mansouraty, Telford Nab., MD;  Location: [facility];  Service: Gastroenterology;;  . Lavell Islam REMOVAL  02/09/2018   Procedure: STENT REMOVAL;  Surgeon: Irving Copas., MD;  Location: [facility];  Service: Gastroenterology;;  . Lavell Islam REMOVAL  03/30/2018   Procedure: STENT REMOVAL;  Surgeon: Irving Copas., MD;  Location: [facility];  Service: Gastroenterology;;  . TRANSTHORACIC ECHOCARDIOGRAM  04/2015   EF 40-45%, mod antsept, ant, antlat, apical HK, mild AI/MR, PASP 26mHg.  .Marland KitchenUVULOPALATOPHARYNGOPLASTY, TONSILLECTOMY AND SEPTOPLASTY  2002    MEDICATIONS: No current facility-administered medications for this encounter.    .Marland Kitchenacetaminophen (TYLENOL) 500 MG tablet  . Ascorbic Acid (VITAMIN C) 1000 MG tablet  . atorvastatin (LIPITOR) 80 MG tablet  . Carboxymethylcellulose Sodium (LUBRICANT EYE DROPS OP)  . Cholecalciferol (VITAMIN D3) 50 MCG (2000 UT) TABS  . gabapentin (NEURONTIN) 600 MG tablet  . lisinopril (PRINIVIL,ZESTRIL) 2.5 MG tablet  . Magnesium 250 MG TABS  . Multiple Vitamin  (MULTIVITAMIN WITH MINERALS) TABS tablet  . Omega-3 Fatty Acids (FISH OIL) 1000 MG CAPS  . omeprazole (PRILOSEC) 40 MG capsule  . sildenafil (VIAGRA) 100 MG tablet  . vitamin B-12 (CYANOCOBALAMIN) 1000 MCG tablet  . vitamin E 400 UNIT capsule  . aspirin 81 MG chewable tablet  . SUPREP BOWEL PREP KIT 17.5-3.13-1.6 GM/177ML SOLN    AMyra Gianotti PA-C Surgical Short Stay/Anesthesiology [facility] LLCPhone ([phone] StPhone ([phone]/07/2018 11:59 AM

## 2018-06-03 NOTE — Anesthesia Preprocedure Evaluation (Addendum)
Anesthesia Evaluation  Patient identified by MRN, date of birth, ID band Patient awake    Reviewed: Allergy & Precautions, NPO status , Patient's Chart, lab work & pertinent test results  History of Anesthesia Complications Negative for: history of anesthetic complications  Airway Mallampati: I  TM Distance: >3 FB Neck ROM: Full   Comment: S/p UPPP Dental  (+) Dental Advisory Given, Partial Upper, Poor Dentition   Pulmonary sleep apnea , former smoker,    Pulmonary exam normal breath sounds clear to auscultation       Cardiovascular hypertension, Pt. on medications + CAD (s/p multiple stents, last in 2005), + Past MI, + Cardiac Stents and +CHF  Normal cardiovascular exam Rhythm:Regular Rate:Normal  TTE 2016: EF 40-45%, moderate hypokinesis of the anteroseptal, anterior, anterolateral, and apical myocardium, mild AR, mild MR, PASP 30mHg   Left heart cath 2016: no significant residual occlusive disease   Neuro/Psych  Headaches, Post polio syndrome, uses cane    GI/Hepatic Neg liver ROS, GERD  Medicated and Controlled,  Endo/Other  negative endocrine ROS  Renal/GU negative Renal ROS     Musculoskeletal  (+) Arthritis , Chronic back pain s/p lumbar and cervical stimulator placement   Abdominal   Peds  Hematology negative hematology ROS (+)   Anesthesia Other Findings Day of surgery medications reviewed with the patient.  Reproductive/Obstetrics                           Anesthesia Physical Anesthesia Plan  ASA: III  Anesthesia Plan: MAC   Post-op Pain Management:    Induction:   PONV Risk Score and Plan: 1 and Treatment may vary due to age or medical condition and Propofol infusion  Airway Management Planned: Simple Face Mask and Nasal Cannula  Additional Equipment:   Intra-op Plan:   Post-operative Plan:   Informed Consent: I have reviewed the patients History and Physical,  chart, labs and discussed the procedure including the risks, benefits and alternatives for the proposed anesthesia with the patient or authorized representative who has indicated his/her understanding and acceptance.   Dental advisory given  Plan Discussed with: CRNA  Anesthesia Plan Comments: (PAT note written 06/03/2018 by AMyra Gianotti PA-C. )      Anesthesia Quick Evaluation

## 2018-06-06 ENCOUNTER — Encounter (HOSPITAL_COMMUNITY): Payer: Self-pay | Admitting: *Deleted

## 2018-06-06 ENCOUNTER — Encounter (HOSPITAL_COMMUNITY)
Admission: RE | Disposition: A | Discharge status: Home / Self Care | Payer: Self-pay | Source: Home / Self Care | Attending: Gastroenterology

## 2018-06-06 ENCOUNTER — Ambulatory Visit (HOSPITAL_COMMUNITY): Payer: Medicare Other | Admitting: Vascular Surgery

## 2018-06-06 ENCOUNTER — Ambulatory Visit (HOSPITAL_COMMUNITY)
Admission: RE | Admit: 2018-06-06 | Discharge: 2018-06-06 | Disposition: A | Discharge status: Home / Self Care | Payer: Medicare Other | Attending: Gastroenterology | Admitting: Gastroenterology

## 2018-06-06 DIAGNOSIS — K5900 Constipation, unspecified: Secondary | ICD-10-CM | POA: Diagnosis not present

## 2018-06-06 DIAGNOSIS — R194 Change in bowel habit: Secondary | ICD-10-CM

## 2018-06-06 DIAGNOSIS — G14 Postpolio syndrome: Secondary | ICD-10-CM | POA: Insufficient documentation

## 2018-06-06 DIAGNOSIS — K219 Gastro-esophageal reflux disease without esophagitis: Secondary | ICD-10-CM | POA: Insufficient documentation

## 2018-06-06 DIAGNOSIS — Z87891 Personal history of nicotine dependence: Secondary | ICD-10-CM | POA: Diagnosis not present

## 2018-06-06 DIAGNOSIS — R197 Diarrhea, unspecified: Secondary | ICD-10-CM | POA: Diagnosis not present

## 2018-06-06 DIAGNOSIS — F329 Major depressive disorder, single episode, unspecified: Secondary | ICD-10-CM | POA: Diagnosis not present

## 2018-06-06 DIAGNOSIS — Z86010 Personal history of colonic polyps: Secondary | ICD-10-CM

## 2018-06-06 DIAGNOSIS — M1990 Unspecified osteoarthritis, unspecified site: Secondary | ICD-10-CM | POA: Insufficient documentation

## 2018-06-06 DIAGNOSIS — G894 Chronic pain syndrome: Secondary | ICD-10-CM | POA: Diagnosis not present

## 2018-06-06 DIAGNOSIS — I255 Ischemic cardiomyopathy: Secondary | ICD-10-CM | POA: Diagnosis not present

## 2018-06-06 DIAGNOSIS — Z9889 Other specified postprocedural states: Secondary | ICD-10-CM

## 2018-06-06 DIAGNOSIS — I252 Old myocardial infarction: Secondary | ICD-10-CM | POA: Insufficient documentation

## 2018-06-06 DIAGNOSIS — Z955 Presence of coronary angioplasty implant and graft: Secondary | ICD-10-CM | POA: Diagnosis not present

## 2018-06-06 DIAGNOSIS — K3189 Other diseases of stomach and duodenum: Secondary | ICD-10-CM | POA: Insufficient documentation

## 2018-06-06 DIAGNOSIS — K228 Other specified diseases of esophagus: Secondary | ICD-10-CM | POA: Diagnosis not present

## 2018-06-06 DIAGNOSIS — G4733 Obstructive sleep apnea (adult) (pediatric): Secondary | ICD-10-CM | POA: Insufficient documentation

## 2018-06-06 DIAGNOSIS — I10 Essential (primary) hypertension: Secondary | ICD-10-CM | POA: Insufficient documentation

## 2018-06-06 DIAGNOSIS — K642 Third degree hemorrhoids: Secondary | ICD-10-CM | POA: Insufficient documentation

## 2018-06-06 DIAGNOSIS — R1310 Dysphagia, unspecified: Secondary | ICD-10-CM | POA: Diagnosis not present

## 2018-06-06 DIAGNOSIS — K315 Obstruction of duodenum: Secondary | ICD-10-CM | POA: Diagnosis not present

## 2018-06-06 DIAGNOSIS — I480 Paroxysmal atrial fibrillation: Secondary | ICD-10-CM | POA: Diagnosis not present

## 2018-06-06 DIAGNOSIS — R6881 Early satiety: Secondary | ICD-10-CM | POA: Diagnosis not present

## 2018-06-06 DIAGNOSIS — G629 Polyneuropathy, unspecified: Secondary | ICD-10-CM | POA: Diagnosis not present

## 2018-06-06 DIAGNOSIS — E785 Hyperlipidemia, unspecified: Secondary | ICD-10-CM | POA: Insufficient documentation

## 2018-06-06 DIAGNOSIS — I2510 Atherosclerotic heart disease of native coronary artery without angina pectoris: Secondary | ICD-10-CM | POA: Insufficient documentation

## 2018-06-06 DIAGNOSIS — D123 Benign neoplasm of transverse colon: Secondary | ICD-10-CM | POA: Insufficient documentation

## 2018-06-06 HISTORY — PX: COLONOSCOPY WITH PROPOFOL: SHX5780

## 2018-06-06 HISTORY — PX: BIOPSY: SHX5522

## 2018-06-06 HISTORY — PX: POLYPECTOMY: SHX5525

## 2018-06-06 HISTORY — PX: BALLOON DILATION: SHX5330

## 2018-06-06 HISTORY — DX: Fatty (change of) liver, not elsewhere classified: K76.0

## 2018-06-06 HISTORY — PX: ESOPHAGOGASTRODUODENOSCOPY (EGD) WITH PROPOFOL: SHX5813

## 2018-06-06 HISTORY — DX: Gastro-esophageal reflux disease without esophagitis: K21.9

## 2018-06-06 SURGERY — ESOPHAGOGASTRODUODENOSCOPY (EGD) WITH PROPOFOL
Anesthesia: Monitor Anesthesia Care

## 2018-06-06 MED ORDER — PHENYLEPHRINE 40 MCG/ML (10ML) SYRINGE FOR IV PUSH (FOR BLOOD PRESSURE SUPPORT)
PREFILLED_SYRINGE | INTRAVENOUS | Status: DC | PRN
  Administered 2018-06-06: 120 ug via INTRAVENOUS
  Administered 2018-06-06: 80 ug via INTRAVENOUS
  Administered 2018-06-06: 40 ug via INTRAVENOUS
  Administered 2018-06-06 (×2): 80 ug via INTRAVENOUS

## 2018-06-06 MED ORDER — PHENYLEPHRINE HCL 10 MG/ML IJ SOLN: INTRAMUSCULAR | Status: DC | PRN

## 2018-06-06 MED ORDER — SODIUM CHLORIDE 0.9 % IV SOLN: INTRAVENOUS | Status: DC

## 2018-06-06 MED ORDER — PROPOFOL 10 MG/ML IV BOLUS
INTRAVENOUS | Status: DC | PRN
  Administered 2018-06-06 (×2): 10 mg via INTRAVENOUS

## 2018-06-06 MED ORDER — OMEPRAZOLE 40 MG PO CPDR: 40.0000 mg | DELAYED_RELEASE_CAPSULE | Freq: Two times a day (BID) | ORAL | 3 refills | Status: DC

## 2018-06-06 MED ORDER — LACTATED RINGERS IV SOLN
INTRAVENOUS | Status: DC | PRN
  Administered 2018-06-06: 07:00:00 via INTRAVENOUS

## 2018-06-06 MED ORDER — PROPOFOL 500 MG/50ML IV EMUL
INTRAVENOUS | Status: DC | PRN
  Administered 2018-06-06: 100 ug/kg/min via INTRAVENOUS
  Administered 2018-06-06: 08:00:00 via INTRAVENOUS

## 2018-06-06 SURGICAL SUPPLY — 25 items

## 2018-06-06 NOTE — Anesthesia Procedure Notes (Signed)
Procedure Name: MAC Date/Time: 06/06/2018 7:35 AM Performed by: Harden Mo, CRNA Pre-anesthesia Checklist: Patient identified, Emergency Drugs available, Suction available and Patient being monitored Patient Re-evaluated:Patient Re-evaluated prior to induction Oxygen Delivery Method: Nasal cannula Preoxygenation: Pre-oxygenation with 100% oxygen Induction Type: IV induction Placement Confirmation: positive ETCO2 and breath sounds checked- equal and bilateral Dental Injury: Teeth and Oropharynx as per pre-operative assessment

## 2018-06-06 NOTE — Op Note (Signed)
[facility] Patient Name: Alexander [name] Procedure Date : 06/06/2018 MRN: [id number] Attending MD: Justice Britain , MD Date of Birth: 06-24-1950 CSN: [id number] Age: 68 Admit Type: Outpatient Procedure:                Colonoscopy Indications:              High risk colon cancer surveillance: Personal                            history of colonic polyps, Incidental - Clinically                            significant diarrhea of unexplained origin,                            Incidental - Change in bowel habits Providers:                Justice Britain, MD, Carlyn Reichert, RN, William Dalton, Technician Referring MD:             [name] Medicines:                Monitored Anesthesia Care Complications:            No immediate complications. Estimated Blood Loss:     Estimated blood loss was minimal. Procedure:                Pre-Anesthesia Assessment:                           - Prior to the procedure, a History and Physical                            was performed, and patient medications and                            allergies were reviewed. [name] tolerance of                            previous anesthesia was also reviewed. The risks                            and benefits of the procedure and the sedation                            options and risks were discussed with the patient.                            All questions were answered, and informed consent                            was obtained. Prior Anticoagulants: The patient has  taken aspirin. ASA Grade Assessment: III - A                            patient with severe systemic disease. After                            reviewing the risks and benefits, the patient was                            deemed in satisfactory condition to undergo the                            procedure.                           After obtaining informed consent,  the colonoscope                            was passed under direct vision. Throughout the                            procedure, the patient's blood pressure, pulse, and                            oxygen saturations were monitored continuously. The                            CF-HQ190L ([id number]) Olympus colonoscope was                            introduced through the anus and advanced to the the                            cecum, identified by appendiceal orifice and                            ileocecal valve. The colonoscopy was performed                            without difficulty. The patient tolerated the                            procedure. The quality of the bowel preparation was                            evaluated using the BBPS [facility] Bowel Preparation                            Scale) with scores of: Right Colon = 2 (minor                            amount of residual staining, small fragments of  stool and/or opaque liquid, but mucosa seen well),                            Transverse Colon = 3 (entire mucosa seen well with                            no residual staining, small fragments of stool or                            opaque liquid) and Left Colon = 3 (entire mucosa                            seen well with no residual staining, small                            fragments of stool or opaque liquid). The total                            BBPS score equals 8. The quality of the bowel                            preparation was good. Scope In: 8:18:58 AM Scope Out: 8:39:02 AM Scope Withdrawal Time: 0 hours 15 minutes 7 seconds  Total Procedure Duration: 0 hours 20 minutes 4 seconds  Findings:      The digital rectal exam findings include non-thrombosed internal       hemorrhoids and perianal erythema. Pertinent negatives include no       palpable rectal lesions.      The terminal ileum and ileocecal valve appeared normal. Biopsies were       taken  with a cold forceps for histology to rule out enteropathy.      A 2 mm polyp was found in the hepatic flexure. The polyp was sessile.       The polyp was removed with a jumbo cold forceps. Resection and retrieval       were complete.      Normal mucosa was found in the entire colon. Biopsies were taken with a       cold forceps for histology to rule out microscopic colitis.      Normal mucosa was found in the rectum. Biopsies were taken with a cold       forceps for histology to rule out proctitis.      Non-bleeding non-thrombosed internal hemorrhoids were found during       retroflexion, during perianal exam and during digital exam. The       hemorrhoids were Grade III (internal hemorrhoids that prolapse but       require manual reduction). Impression:               - Non-thrombosed internal hemorrhoids and perianal                            erythema found on digital rectal exam.                           - The examined portion of the ileum was normal.  Biopsied.                           - One 2 mm polyp at the hepatic flexure, removed                            with a jumbo cold forceps. Resected and retrieved.                           - Normal mucosa in the entire examined colon.                            Biopsied.                           - Normal mucosa in the rectum. Biopsied.                           - Non-bleeding non-thrombosed internal hemorrhoids. Recommendation:           - The patient will be observed post-procedure,                            until all discharge criteria are met.                           - Discharge patient to home.                           - Patient has a contact number available for                            emergencies. The signs and symptoms of potential                            delayed complications were discussed with the                            patient. Return to normal activities tomorrow.                             Written discharge instructions were provided to the                            patient.                           - Liquid diet today and then Low-residue/Low-fiber                            diet for next 1-2 weeks, to see if helpful for food                            passage from above.                           -  Await pathology results.                           - Repeat colonoscopy in 5 years for surveillance                            based on pathology results because of prior history                            of adenomatous tissue. If this pathology is                            negative for adenoma and his next colon is negative                            for adenoma, then he would go back to average risk                            screening based on current guidelines.                           - The findings and recommendations were discussed                            with the patient.                           - The findings and recommendations were discussed                            with the patient's family. Procedure Code(s):        --- Professional ---                           [phone], Colonoscopy, flexible; with biopsy, single                            or multiple Diagnosis Code(s):        --- Professional ---                           Z86.010, Personal history of colonic polyps                           K64.2, Third degree hemorrhoids                           D12.3, Benign neoplasm of transverse colon (hepatic                            flexure or splenic flexure) CPT copyright 2018 American Medical Association. All rights reserved. The codes documented in this report are preliminary and upon coder review may  be revised to meet current compliance requirements. Justice Britain, MD 06/06/2018 9:12:12 AM Number of Addenda: 0

## 2018-06-06 NOTE — Discharge Instructions (Signed)
YOU HAD AN ENDOSCOPIC PROCEDURE TODAY: Refer to the procedure report and other information in the discharge instructions given to you for any specific questions about what was found during the examination. If this information does not answer your questions, please call [facility] office at [phone] to clarify.  ° °YOU SHOULD EXPECT: Some feelings of bloating in the abdomen. Passage of more gas than usual. Walking can help get rid of the air that was put into your GI tract during the procedure and reduce the bloating. If you had a lower endoscopy (such as a colonoscopy or flexible sigmoidoscopy) you may notice spotting of blood in your stool or on the toilet paper. Some abdominal soreness may be present for a day or two, also. ° °DIET: Your first meal following the procedure should be a light meal and then it is ok to progress to your normal diet. A half-sandwich or bowl of soup is an example of a good first meal. Heavy or fried foods are harder to digest and may make you feel nauseous or bloated. Drink plenty of fluids but you should avoid alcoholic beverages for 24 hours. If you had a esophageal dilation, please see attached instructions for diet.   ° °ACTIVITY: Your care partner should take you home directly after the procedure. You should plan to take it easy, moving slowly for the rest of the day. You can resume normal activity the day after the procedure however YOU SHOULD NOT DRIVE, use power tools, machinery or perform tasks that involve climbing or major physical exertion for 24 hours (because of the sedation medicines used during the test).  ° °SYMPTOMS TO REPORT IMMEDIATELY: °A gastroenterologist can be reached at any hour. Please call [phone]  for any of the following symptoms:  °Following lower endoscopy (colonoscopy, flexible sigmoidoscopy) °Excessive amounts of blood in the stool  °Significant tenderness, worsening of abdominal pains  °Swelling of the abdomen that is new, acute  °Fever of 100° or  higher  °Following upper endoscopy (EGD, EUS, ERCP, esophageal dilation) °Vomiting of blood or coffee ground material  °New, significant abdominal pain  °New, significant chest pain or pain under the shoulder blades  °Painful or persistently difficult swallowing  °New shortness of breath  °Black, tarry-looking or red, bloody stools ° °FOLLOW UP:  °If any biopsies were taken you will be contacted by phone or by letter within the next 1-3 weeks. Call [phone]  if you have not heard about the biopsies in 3 weeks.  °Please also call with any specific questions about appointments or follow up tests. ° °

## 2018-06-06 NOTE — Op Note (Signed)
[facility] Patient Name: Alexander [name] Procedure Date : 06/06/2018 MRN: [id number] Attending MD: Justice Britain , MD Date of Birth: 03-Oct-1950 CSN: [id number] Age: 68 Admit Type: Outpatient Procedure:                Upper GI endoscopy Indications:              Abdominal pain in the left upper quadrant,                            Dysphagia, Stenosis of the duodenum, For therapy of                            duodenal stenosis, Abdominal bloating, Early satiety Providers:                Justice Britain, MD, Carlyn Reichert, RN, William Dalton, Technician, Janeece Agee, Technician Referring MD:              Medicines:                Monitored Anesthesia Care Complications:            No immediate complications. Estimated Blood Loss:     Estimated blood loss was minimal. Procedure:                Pre-Anesthesia Assessment:                           - Prior to the procedure, a History and Physical                            was performed, and patient medications and                            allergies were reviewed. [name] tolerance of                            previous anesthesia was also reviewed. The risks                            and benefits of the procedure and the sedation                            options and risks were discussed with the patient.                            All questions were answered, and informed consent                            was obtained. Prior Anticoagulants: The patient has                            taken aspirin. ASA Grade Assessment: III - A  patient with severe systemic disease. After                            reviewing the risks and benefits, the patient was                            deemed in satisfactory condition to undergo the                            procedure.                           After obtaining informed consent, the endoscope was      passed under direct vision. Throughout the                            procedure, the patient's blood pressure, pulse, and                            oxygen saturations were monitored continuously. The                            GIF-[id number] ([id number]) Olympus gastroscope was                            introduced through the mouth, and advanced to the                            third part of duodenum. The upper GI endoscopy was                            accomplished without difficulty. The patient                            tolerated the procedure. Scope In: Scope Out: Findings:      A few, white nummular lesions were noted in the proximal esophagus, in       the mid esophagus and in the distal esophagus, otherwise the esophagus       mucosa was normal appearing. Biopsies were taken with a cold forceps for       histology from the proximal/middle/distal esophagus and also to rule out       Candida.      The Z-line was regular and was found 40 cm from the incisors.      A J-shaped deformity was found in the entire examined stomach.      No other gross lesions were noted in the entire examined stomach.       Biopsies were taken with a cold forceps for histology and Helicobacter       pylori testing from the antrum/incisura/greater curve/lesser curve.      The duodenal bulb was normal.      An acquired benign-appearing, intrinsic moderate stenosis was found in       the D1/D2 angle-sweep of the duodenum and was traversed with the adult       endoscope. This narrowing is just proximal to the ampulla. Biopsies were  taken with a cold forceps for histology. A TTS dilator was passed       through the scope. Dilation with a 12-13.5-15 mm dilator up to 15 mm,       and then a 15-16.5-18 mm balloon dilator up to 16.5 mm was performed.       There was evidence of mucosal irritation while dilating.. The dilation       site was examined and showed moderate mucosal disruption and mild        improvement in luminal narrowing.      There was evidence of a patent sphincterotomy in the area of the       papilla. This was characterized by healthy appearing mucosa.      No gross lesions were noted in the second portion of the duodenum and in       the third portion of the duodenum.      Biopsies were taken with a cold forceps in the duodenal bulb, in the       first portion of the duodenum, in the second portion of the duodenum and       in the third portion of the duodenum for histology to rule out Celiac       disease/enteropathy. Impression:               - A few, white nummular lesions in esophageal                            mucosa but otherwise normal. Biopsied for EoE and                            Candida rule out.                           - Z-line regular, 40 cm from the incisors.                           - J-shaped deformity in the entire stomach.                           - No other gross lesions in the stomach. Biopsied                            for HP.                           - Normal duodenal bulb.                           - Acquired duodenal stenosis. Biopsied. Dilated to                            16.5 mm.                           - Patent sphincterotomy, characterized by healthy                            appearing mucosa was found.                           -  No other gross lesions in the second portion of                            the duodenum and in the third portion of the                            duodenum.                           - Biopsies were taken with a cold forceps for                            histology in the duodenal bulb, in the first                            portion of the duodenum, in the second portion of                            the duodenum and in the third portion of the                            duodenum for Celiac/Enteropathy rule out. Recommendation:           - Proceed to scheduled colonoscopy.                            - Increase Omeprazole to 40 mg BID.                           - Plan to repeat EGD in 3-weeks for retreatment and                            repeat dilation of the duodenum.                           - Full Liquid Diet today and then advance to soft                            low-residue/low-fiber diet for next week.                           - The findings and recommendations were discussed                            with the patient.                           - The findings and recommendations were discussed                            with the patient's family. Procedure Code(s):        --- Professional ---                           [phone], Esophagogastroduodenoscopy,  flexible,                            transoral; with dilation of gastric/duodenal                            stricture(s) (eg, balloon, bougie)                           43239, 59, Esophagogastroduodenoscopy, flexible,                            transoral; with biopsy, single or multiple Diagnosis Code(s):        --- Professional ---                           K22.8, Other specified diseases of esophagus                           K31.89, Other diseases of stomach and duodenum                           K31.5, Obstruction of duodenum                           Z98.890, Other specified postprocedural states                           R10.12, Left upper quadrant pain                           R13.10, Dysphagia, unspecified                           R14.0, Abdominal distension (gaseous)                           R68.81, Early satiety CPT copyright 2018 American Medical Association. All rights reserved. The codes documented in this report are preliminary and upon coder review may  be revised to meet current compliance requirements. Justice Britain, MD 06/06/2018 9:03:30 AM Number of Addenda: 0

## 2018-06-06 NOTE — Anesthesia Postprocedure Evaluation (Signed)
Anesthesia Post Note  Patient: Alexander [name]  Procedure(s) Performed: ESOPHAGOGASTRODUODENOSCOPY (EGD) WITH PROPOFOL (N/A ) COLONOSCOPY WITH PROPOFOL (N/A ) BIOPSY BALLOON DILATION (N/A ) POLYPECTOMY     Patient location during evaluation: PACU Anesthesia Type: MAC Level of consciousness: awake and alert Pain management: pain level controlled Vital Signs Assessment: post-procedure vital signs reviewed and stable Respiratory status: spontaneous breathing, nonlabored ventilation and respiratory function stable Cardiovascular status: stable and blood pressure returned to baseline Postop Assessment: no apparent nausea or vomiting Anesthetic complications: no    Last Vitals:  Vitals:   06/06/18 0900 06/06/18 0910  BP: 106/73 111/74  Pulse: (!) 52 (!) 50  Resp: (!) 9 12  Temp:    SpO2: 99% 99%    Last Pain:  Vitals:   06/06/18 0910  TempSrc:   PainSc: 0-No pain                 Brennan Bailey

## 2018-06-06 NOTE — H&P (Addendum)
GASTROENTEROLOGY OUTPATIENT PROCEDURE H&P NOTE   Primary Care Physician: [name], MD  HPI: Alexander [name] is a 68 y.o. male who presents for EGD with duodenal stricture evaluation and Colonoscopy.  Past Medical History:  Diagnosis Date  . Acute cholecystitis 03/2016   with sepsis  . Arthritis   . CAD S/P percutaneous coronary angioplasty 1995, 2000, 2005   a. 1995 s/p BMS;  b. 2000 s/p BMS;  c. 2005 s/p stent - All stents in [facility] (RCA, LAD & OM - unknown on which date);  d. 04/2015 NSTEMI/Cath: LM nl, ost/p LAD 20%, patent mLAD stent, RI small, OM2 patent stent, pRCA 20%, patent stent, EF 45-50%-->Med Rx.  . Calculus of bile duct with acute cholangitis with obstruction   . Chronic pain syndrome    a. Followed @ [facility];  b. Uses 12-14 excedrins per day.  . Essential hypertension   . Fatty liver   . Gallstones   . GERD (gastroesophageal reflux disease)   . History of depression   . History of post poliomyelitis muscular atrophy   . HLD (hyperlipidemia)   . Ischemic cardiomyopathy    a. 04/2015 Echo: EF 40-45%, mod antsept, ant, antlat, apical HK, mild AI/MR, PASP 59mmHg.  . Migraines   . 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 NSTEMI (non-ST elevated myocardial infarction) ([place]) 04/2015   - Demand Ischemia in setting of Sepsis (also with prior MI history  . OSA (obstructive sleep apnea)    does not want to use CPAP, using mouth guard  . PAF (paroxysmal atrial fibrillation) ([place])    a. 04/2015 in setting of cholecystits and sepsis -->Amio;  b. CHA2DS2VASc = 4--> Eliquis.  . Pneumonia 04/2015  . Post-polio syndrome    a. ambulates with braces.  . Shoulder pain, right    Past Surgical History:  Procedure Laterality Date  . BILIARY STENT PLACEMENT  01/08/2018   Procedure: BILIARY STENT PLACEMENT;  Surgeon: Jackquline Denmark, MD;  Location: [facility] ENDOSCOPY;  Service: Endoscopy;;  . BILIARY STENT PLACEMENT N/A 02/09/2018   Procedure: BILIARY STENT  PLACEMENT;  Surgeon: Irving Copas., MD;  Location: [facility]OS[facility];  Service: Gastroenterology;  Laterality: N/A;  . CARDIAC CATHETERIZATION N/A 04/30/2015   Procedure: Left Heart Cath and Coronary Angiography;  Surgeon: Troy Sine, MD;  Location: [place] CV LAB;  Service: Cardiovascular; LM nl, ost/p LAD 20%, patent mLAD stent, RI small, OM2 patent stent, pRCA 20%, patent stent, EF 45-50%-->Med Rx   . cervical spine stimulator  08/2012  . CHOLECYSTECTOMY  06/10/2015   Procedure: LAPAROSCOPIC CHOLECYSTECTOMY;  Surgeon: Mickeal Skinner, MD;  Location: [place];  Service: General;;  . COLONOSCOPY  09/2013   TA x2, rpt 5 yrs ([place])  . CORONARY ANGIOPLASTY WITH STENT PLACEMENT  1995, 2000, 2005   stents in RCA, LAD & Cx-OM. -Stents were patent by cardiac catheterization in November 2016  . ENDOSCOPIC RETROGRADE CHOLANGIOPANCREATOGRAPHY (ERCP) WITH PROPOFOL N/A 02/09/2018   Procedure: ENDOSCOPIC RETROGRADE CHOLANGIOPANCREATOGRAPHY (ERCP) WITH PROPOFOL;  Surgeon: Rush Landmark Telford Nab., MD;  Location: [facility] ENDOSCOPY;  Service: Gastroenterology;  Laterality: N/A;  . ENDOSCOPIC RETROGRADE CHOLANGIOPANCREATOGRAPHY (ERCP) WITH PROPOFOL N/A 03/30/2018   Procedure: ENDOSCOPIC RETROGRADE CHOLANGIOPANCREATOGRAPHY (ERCP) WITH PROPOFOL;  Surgeon: Rush Landmark Telford Nab., MD;  Location: [facility] ENDOSCOPY;  Service: Gastroenterology;  Laterality: N/A;  . ERCP N/A 01/08/2018   Procedure: ENDOSCOPIC RETROGRADE CHOLANGIOPANCREATOGRAPHY (ERCP);  Surgeon: Jackquline Denmark, MD;  Location: [facility] ENDOSCOPY;  Service: Endoscopy;  Laterality: N/A;  .  ESOPHAGOGASTRODUODENOSCOPY N/A 03/30/2018   Procedure: ESOPHAGOGASTRODUODENOSCOPY (EGD);  Surgeon: Irving Copas., MD;  Location: [facility];  Service: Gastroenterology;  Laterality: N/A;  . lumbar spine stimulator    . REMOVAL OF STONES  03/30/2018   Procedure: REMOVAL OF STONES;  Surgeon: Rush Landmark Telford Nab., MD;  Location: [facility];  Service:  Gastroenterology;;  . Joan Mayans  01/08/2018   Procedure: Joan Mayans;  Surgeon: Jackquline Denmark, MD;  Location: [place];  Service: Endoscopy;;  . Joan Mayans  02/09/2018   Procedure: Joan Mayans;  Surgeon: Mansouraty, Telford Nab., MD;  Location: [facility];  Service: Gastroenterology;;  . Lavell Islam REMOVAL  02/09/2018   Procedure: STENT REMOVAL;  Surgeon: Irving Copas., MD;  Location: [facility];  Service: Gastroenterology;;  . Lavell Islam REMOVAL  03/30/2018   Procedure: STENT REMOVAL;  Surgeon: Irving Copas., MD;  Location: [facility] ENDOSCOPY;  Service: Gastroenterology;;  . TRANSTHORACIC ECHOCARDIOGRAM  04/2015   EF 40-45%, mod antsept, ant, antlat, apical HK, mild AI/MR, PASP 65mmHg.  Marland Kitchen UVULOPALATOPHARYNGOPLASTY, TONSILLECTOMY AND SEPTOPLASTY  2002   Current Facility-Administered Medications  Medication Dose Route Frequency Provider Last Rate Last Dose  . 0.9 %  sodium chloride infusion   Intravenous Continuous Mansouraty, Telford Nab., MD       No Known Allergies Family History  Problem Relation Age of Onset  . Diabetes Mother   . Heart disease Mother   . Cancer Sister 28       male cancer  . Breast cancer Brother 59  . Hypertension Maternal Aunt   . Coronary artery disease Neg Hx   . Stroke Neg Hx   . Colon cancer Neg Hx   . Pancreatic cancer Neg Hx   . Stomach cancer Neg Hx   . Esophageal cancer Neg Hx   . Inflammatory bowel disease Neg Hx   . Liver disease Neg Hx   . Rectal cancer Neg Hx    Social History   Socioeconomic History  . Marital status: Married    Spouse name: Not on file  . Number of children: 3  . Years of education: Not on file  . Highest education level: Not on file  Occupational History  . Occupation: retired  Scientific laboratory technician  . Financial resource strain: Not on file  . Food insecurity:    Worry: Not on file    Inability: Not on file  . Transportation needs:    Medical: Not on file    Non-medical: Not on file  Tobacco  Use  . Smoking status: Former Smoker    Types: Cigarettes    Last attempt to quit: 1972    Years since quitting: 48.0  . Smokeless tobacco: Never Used  . Tobacco comment: was very light  Substance and Sexual Activity  . Alcohol use: Yes    Alcohol/week: 0.0 standard drinks    Comment: moderate  . Drug use: No  . Sexual activity: Yes  Lifestyle  . Physical activity:    Days per week: Not on file    Minutes per session: Not on file  . Stress: Not on file  Relationships  . Social connections:    Talks on phone: Not on file    Gets together: Not on file    Attends religious service: Not on file    Active member of club or organization: Not on file    Attends meetings of clubs or organizations: Not on file    Relationship status: Not on file  . Intimate partner violence:    Fear  of current or ex partner: Not on file    Emotionally abused: Not on file    Physically abused: Not on file    Forced sexual activity: Not on file  Other Topics Concern  . 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 BMET No results for input(s): NA, K, CL, CO2, GLUCOSE, BUN, CREATININE, CALCIUM in the last 72 hours. LFT No results for  input(s): PROT, ALBUMIN, AST, ALT, ALKPHOS, BILITOT, BILIDIR, IBILI in the last 72 hours. PT/INR No results for input(s): LABPROT, INR in the last 72 hours.   Impression / Plan: This is a 68 y.o.male who presents for EGD with duodenal stricture evaluation and Colonoscopy.  The risks and benefits of endoscopic evaluation were discussed with the patient; these include but are not limited to the risk of perforation, infection, bleeding, missed lesions, lack of diagnosis, severe illness requiring hospitalization, as well as anesthesia and sedation related illnesses.  The patient is agreeable to proceed.    Justice Britain, MD [facility] Office # [phone]

## 2018-06-06 NOTE — H&P (View-Only) (Signed)
GASTROENTEROLOGY OUTPATIENT PROCEDURE H&P NOTE   Primary Care Physician: [name], MD  HPI: Alexander [name] is a 68 y.o. male who presents for EGD with duodenal stricture evaluation and Colonoscopy.  Past Medical History:  Diagnosis Date  . Acute cholecystitis 03/2016   with sepsis  . Arthritis   . CAD S/P percutaneous coronary angioplasty 1995, 2000, 2005   a. 1995 s/p BMS;  b. 2000 s/p BMS;  c. 2005 s/p stent - All stents in [facility] (RCA, LAD & OM - unknown on which date);  d. 04/2015 NSTEMI/Cath: LM nl, ost/p LAD 20%, patent mLAD stent, RI small, OM2 patent stent, pRCA 20%, patent stent, EF 45-50%-->Med Rx.  . Calculus of bile duct with acute cholangitis with obstruction   . Chronic pain syndrome    a. Followed @ [facility];  b. Uses 12-14 excedrins per day.  . Essential hypertension   . Fatty liver   . Gallstones   . GERD (gastroesophageal reflux disease)   . History of depression   . History of post poliomyelitis muscular atrophy   . HLD (hyperlipidemia)   . Ischemic cardiomyopathy    a. 04/2015 Echo: EF 40-45%, mod antsept, ant, antlat, apical HK, mild AI/MR, PASP 50mmHg.  . Migraines   . 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 NSTEMI (non-ST elevated myocardial infarction) ([place]) 04/2015   - Demand Ischemia in setting of Sepsis (also with prior MI history  . OSA (obstructive sleep apnea)    does not want to use CPAP, using mouth guard  . PAF (paroxysmal atrial fibrillation) ([place])    a. 04/2015 in setting of cholecystits and sepsis -->Amio;  b. CHA2DS2VASc = 4--> Eliquis.  . Pneumonia 04/2015  . Post-polio syndrome    a. ambulates with braces.  . Shoulder pain, right    Past Surgical History:  Procedure Laterality Date  . BILIARY STENT PLACEMENT  01/08/2018   Procedure: BILIARY STENT PLACEMENT;  Surgeon: Jackquline Denmark, MD;  Location: [facility] ENDOSCOPY;  Service: Endoscopy;;  . BILIARY STENT PLACEMENT N/A 02/09/2018   Procedure: BILIARY STENT  PLACEMENT;  Surgeon: Irving Copas., MD;  Location: [facility] ENDOSCOPY;  Service: Gastroenterology;  Laterality: N/A;  . CARDIAC CATHETERIZATION N/A 04/30/2015   Procedure: Left Heart Cath and Coronary Angiography;  Surgeon: Troy Sine, MD;  Location: [place] CV LAB;  Service: Cardiovascular; LM nl, ost/p LAD 20%, patent mLAD stent, RI small, OM2 patent stent, pRCA 20%, patent stent, EF 45-50%-->Med Rx   . cervical spine stimulator  08/2012  . CHOLECYSTECTOMY  06/10/2015   Procedure: LAPAROSCOPIC CHOLECYSTECTOMY;  Surgeon: Mickeal Skinner, MD;  Location: [place];  Service: General;;  . COLONOSCOPY  09/2013   TA x2, rpt 5 yrs ([place])  . CORONARY ANGIOPLASTY WITH STENT PLACEMENT  1995, 2000, 2005   stents in RCA, LAD & Cx-OM. -Stents were patent by cardiac catheterization in November 2016  . ENDOSCOPIC RETROGRADE CHOLANGIOPANCREATOGRAPHY (ERCP) WITH PROPOFOL N/A 02/09/2018   Procedure: ENDOSCOPIC RETROGRADE CHOLANGIOPANCREATOGRAPHY (ERCP) WITH PROPOFOL;  Surgeon: Rush Landmark Telford Nab., MD;  Location: [facility] ENDOSCOPY;  Service: Gastroenterology;  Laterality: N/A;  . ENDOSCOPIC RETROGRADE CHOLANGIOPANCREATOGRAPHY (ERCP) WITH PROPOFOL N/A 03/30/2018   Procedure: ENDOSCOPIC RETROGRADE CHOLANGIOPANCREATOGRAPHY (ERCP) WITH PROPOFOL;  Surgeon: Rush Landmark Telford Nab., MD;  Location: [facility] ENDOSCOPY;  Service: Gastroenterology;  Laterality: N/A;  . ERCP N/A 01/08/2018   Procedure: ENDOSCOPIC RETROGRADE CHOLANGIOPANCREATOGRAPHY (ERCP);  Surgeon: Jackquline Denmark, MD;  Location: [facility] ENDOSCOPY;  Service: Endoscopy;  Laterality: N/A;  .  ESOPHAGOGASTRODUODENOSCOPY N/A 03/30/2018   Procedure: ESOPHAGOGASTRODUODENOSCOPY (EGD);  Surgeon: Irving Copas., MD;  Location: [facility] ENDOSCOPY;  Service: Gastroenterology;  Laterality: N/A;  . lumbar spine stimulator    . REMOVAL OF STONES  03/30/2018   Procedure: REMOVAL OF STONES;  Surgeon: Rush Landmark Telford Nab., MD;  Location: [facility] ENDOSCOPY;  Service:  Gastroenterology;;  . Joan Mayans  01/08/2018   Procedure: Joan Mayans;  Surgeon: Jackquline Denmark, MD;  Location: [place];  Service: Endoscopy;;  . Joan Mayans  02/09/2018   Procedure: Joan Mayans;  Surgeon: Mansouraty, Telford Nab., MD;  Location: [facility] ENDOSCOPY;  Service: Gastroenterology;;  . Lavell Islam REMOVAL  02/09/2018   Procedure: STENT REMOVAL;  Surgeon: Irving Copas., MD;  Location: [facility] ENDOSCOPY;  Service: Gastroenterology;;  . Lavell Islam REMOVAL  03/30/2018   Procedure: STENT REMOVAL;  Surgeon: Irving Copas., MD;  Location: [facility] ENDOSCOPY;  Service: Gastroenterology;;  . TRANSTHORACIC ECHOCARDIOGRAM  04/2015   EF 40-45%, mod antsept, ant, antlat, apical HK, mild AI/MR, PASP 43mmHg.  Marland Kitchen UVULOPALATOPHARYNGOPLASTY, TONSILLECTOMY AND SEPTOPLASTY  2002   Current Facility-Administered Medications  Medication Dose Route Frequency Provider Last Rate Last Dose  . 0.9 %  sodium chloride infusion   Intravenous Continuous Mansouraty, Telford Nab., MD       No Known Allergies Family History  Problem Relation Age of Onset  . Diabetes Mother   . Heart disease Mother   . Cancer Sister 51       male cancer  . Breast cancer Brother 7  . Hypertension Maternal Aunt   . Coronary artery disease Neg Hx   . Stroke Neg Hx   . Colon cancer Neg Hx   . Pancreatic cancer Neg Hx   . Stomach cancer Neg Hx   . Esophageal cancer Neg Hx   . Inflammatory bowel disease Neg Hx   . Liver disease Neg Hx   . Rectal cancer Neg Hx    Social History   Socioeconomic History  . Marital status: Married    Spouse name: Not on file  . Number of children: 3  . Years of education: Not on file  . Highest education level: Not on file  Occupational History  . Occupation: retired  Scientific laboratory technician  . Financial resource strain: Not on file  . Food insecurity:    Worry: Not on file    Inability: Not on file  . Transportation needs:    Medical: Not on file    Non-medical: Not on file  Tobacco  Use  . Smoking status: Former Smoker    Types: Cigarettes    Last attempt to quit: 1972    Years since quitting: 48.0  . Smokeless tobacco: Never Used  . Tobacco comment: was very light  Substance and Sexual Activity  . Alcohol use: Yes    Alcohol/week: 0.0 standard drinks    Comment: moderate  . Drug use: No  . Sexual activity: Yes  Lifestyle  . Physical activity:    Days per week: Not on file    Minutes per session: Not on file  . Stress: Not on file  Relationships  . Social connections:    Talks on phone: Not on file    Gets together: Not on file    Attends religious service: Not on file    Active member of club or organization: Not on file    Attends meetings of clubs or organizations: Not on file    Relationship status: Not on file  . Intimate partner violence:    Fear  of current or ex partner: Not on file    Emotionally abused: Not on file    Physically abused: Not on file    Forced sexual activity: Not on file  Other Topics Concern  . 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 BMET No results for input(s): NA, K, CL, CO2, GLUCOSE, BUN, CREATININE, CALCIUM in the last 72 hours. LFT No results for  input(s): PROT, ALBUMIN, AST, ALT, ALKPHOS, BILITOT, BILIDIR, IBILI in the last 72 hours. PT/INR No results for input(s): LABPROT, INR in the last 72 hours.   Impression / Plan: This is a 68 y.o.male who presents for EGD with duodenal stricture evaluation and Colonoscopy.  The risks and benefits of endoscopic evaluation were discussed with the patient; these include but are not limited to the risk of perforation, infection, bleeding, missed lesions, lack of diagnosis, severe illness requiring hospitalization, as well as anesthesia and sedation related illnesses.  The patient is agreeable to proceed.    Justice Britain, MD [facility]oscopy Office # [phone]

## 2018-06-06 NOTE — Transfer of Care (Signed)
Immediate Anesthesia Transfer of Care Note  Patient: Alexander [name]  Procedure(s) Performed: ESOPHAGOGASTRODUODENOSCOPY (EGD) WITH PROPOFOL (N/A ) COLONOSCOPY WITH PROPOFOL (N/A ) BIOPSY BALLOON DILATION (N/A ) POLYPECTOMY  Patient Location: Endoscopy Unit  Anesthesia Type:MAC  Level of Consciousness: awake and alert   Airway & Oxygen Therapy: Patient Spontanous Breathing  Post-op Assessment: Report given to RN, Post -op Vital signs reviewed and stable and Patient moving all extremities X 4  Post vital signs: Reviewed and stable  Last Vitals:  Vitals Value Taken Time  BP 109/75 06/06/2018  8:50 AM  Temp    Pulse 62 06/06/2018  8:50 AM  Resp 12 06/06/2018  8:50 AM  SpO2 98 % 06/06/2018  8:50 AM  Vitals shown include unvalidated device data.  Last Pain:  Vitals:   06/06/18 0629  TempSrc: Oral  PainSc: 0-No pain         Complications: No apparent anesthesia complications

## 2018-06-07 ENCOUNTER — Encounter: Payer: Self-pay | Admitting: Gastroenterology

## 2018-06-07 ENCOUNTER — Encounter: Payer: Self-pay | Admitting: Family Medicine

## 2018-06-08 ENCOUNTER — Encounter (HOSPITAL_COMMUNITY): Payer: Self-pay | Admitting: Gastroenterology

## 2018-06-08 ENCOUNTER — Other Ambulatory Visit: Payer: Self-pay

## 2018-06-08 DIAGNOSIS — K315 Obstruction of duodenum: Secondary | ICD-10-CM

## 2018-06-16 ENCOUNTER — Encounter: Payer: Self-pay | Admitting: Family Medicine

## 2018-06-20 NOTE — Telephone Encounter (Signed)
plz call for appointment.

## 2018-06-20 NOTE — Telephone Encounter (Signed)
Appointment 1/21 pt aware

## 2018-06-21 ENCOUNTER — Ambulatory Visit (INDEPENDENT_AMBULATORY_CARE_PROVIDER_SITE_OTHER): Payer: Medicare Other | Admitting: Family Medicine

## 2018-06-21 ENCOUNTER — Encounter: Payer: Self-pay | Admitting: Family Medicine

## 2018-06-21 VITALS — BP 118/82 | HR 73 | Temp 98.2°F | Ht 70.0 in | Wt 220.8 lb

## 2018-06-21 DIAGNOSIS — J22 Unspecified acute lower respiratory infection: Secondary | ICD-10-CM | POA: Diagnosis not present

## 2018-06-21 NOTE — Assessment & Plan Note (Signed)
Possible developing sinusitis. Anticipate viral given short duration. Supportive care reviewed. Red flags to notify us for abx course reviewed. Pt agrees with plan.

## 2018-06-21 NOTE — Progress Notes (Signed)
BP 118/82 (BP Location: Left Arm, Patient Position: Sitting, Cuff Size: Large)   Pulse 73   Temp 98.2 F (36.8 C) (Oral)   Ht _0  (1.778 m)   Wt 220 lb 12 oz (100.1 kg)   SpO2 96%   BMI 31.67 kg/m    CC: URI Subjective:    Patient ID: Alexander [name], male    DOB: 1950-08-09, 68 y.o.   MRN: [id number]  HPI: Alexander [name] is a 68 y.o. male presenting on 06/21/2018 for Cough (C/o productive cough with white mucous, fatigue, nasal congestion and chest congestion. Sxs started 06/15/18. Tried Dayquil, Nyquil and Vick's Vapor Rub. Pt concerned due to an upcoming procedure next month. )   6d h/o congestion, am fatigue, productive cough. Mild L earache. ST and PNdrainage. Sleeping ok.   Treating with dayquil, nyquil, and vicks vaporub.   No fevers/chills, tooth pain, dyspnea or wheezing.  Wife recently sick at home.  No h/o asthma.  Ex smoker - quit 1972.   Had similar illness 05/2018 that resolved after 10 days.   Recent EGD for dilated duodenal stricture, planned repeat EGD next month.  H/o recurrent bile duct stones (Mansouraty).  Managing symptoms with diet choices - increased fiber as well as limiting raw vegetables.     Relevant past medical, surgical, family and social history reviewed and updated as indicated. Interim medical history since our last visit reviewed. Allergies and medications reviewed and updated. Outpatient Medications Prior to Visit  Medication Sig Dispense Refill  . acetaminophen (TYLENOL) 500 MG tablet Take 1,000 mg by mouth every 6 (six) hours as needed for moderate pain or headache.     . Ascorbic Acid (VITAMIN C) 1000 MG tablet Take 1,000 mg by mouth daily.    Marland Kitchen aspirin 81 MG chewable tablet Chew 1 tablet (81 mg total) by mouth daily. 30 tablet 0  . atorvastatin (LIPITOR) 80 MG tablet Take 0.5 tablets (40 mg total) daily at 6 PM by mouth. 90 tablet 3  . Carboxymethylcellulose Sodium (LUBRICANT EYE DROPS OP) Place 2 drops into both eyes daily as  needed (for dry eyes).    . Cholecalciferol (VITAMIN D3) 50 MCG (2000 UT) TABS Take 2,000 Units by mouth daily.    Marland Kitchen gabapentin (NEURONTIN) 600 MG tablet TAKE 2 TABLETS 3 TIMES A DAY (Patient taking differently: Take 1,200 mg by mouth 3 (three) times daily. ) 540 tablet 3  . lisinopril (PRINIVIL,ZESTRIL) 2.5 MG tablet TAKE 1 TABLET BY MOUTH EVERY DAY (Patient taking differently: Take 2.5 mg by mouth daily. ) 90 tablet 1  . Magnesium 250 MG TABS Take 1 tablet (250 mg total) by mouth at bedtime. (Patient taking differently: Take 250 mg by mouth at bedtime. )  0  . Multiple Vitamin (MULTIVITAMIN WITH MINERALS) TABS tablet Take 1 tablet by mouth daily.    . Omega-3 Fatty Acids (FISH OIL) 1000 MG CAPS Take 1,000 mg by mouth daily.     Marland Kitchen omeprazole (PRILOSEC) 40 MG capsule Take 1 capsule (40 mg total) by mouth 2 (two) times daily. 60 capsule 3  . sildenafil (VIAGRA) 100 MG tablet TAKE ONE-HALF TO ONE TABLET BY MOUTH DAILY AS NEEDED FOR ERECTILE DYSFUNCTION (Patient taking differently: Take 50-100 mg by mouth daily as needed for erectile dysfunction. ) 6 tablet 3  . SUPREP BOWEL PREP KIT 17.5-3.13-1.6 GM/177ML SOLN Take 1 kit by mouth as directed. For colonoscopy prep 2 Bottle 0  . vitamin B-12 (CYANOCOBALAMIN) 1000 MCG tablet Take 1,000  mcg by mouth daily.    . vitamin E 400 UNIT capsule Take 400 Units by mouth daily.     No facility-administered medications prior to visit.      Per HPI unless specifically indicated in ROS section below Review of Systems Objective:    BP 118/82 (BP Location: Left Arm, Patient Position: Sitting, Cuff Size: Large)   Pulse 73   Temp 98.2 F (36.8 C) (Oral)   Ht _0  (1.778 m)   Wt 220 lb 12 oz (100.1 kg)   SpO2 96%   BMI 31.67 kg/m   Wt Readings from Last 3 Encounters:  06/21/18 220 lb 12 oz (100.1 kg)  06/06/18 200 lb (90.7 kg)  05/02/18 220 lb (99.8 kg)    Physical Exam Vitals signs and nursing note reviewed.  Constitutional:      General: He is not  in acute distress.    Appearance: He is well-developed.  HENT:     Head: Normocephalic and atraumatic.     Right Ear: Hearing, tympanic membrane, ear canal and external ear normal.     Left Ear: Hearing, tympanic membrane, ear canal and external ear normal.     Nose: Mucosal edema (R>L nasal mucosal edema), congestion and rhinorrhea present.     Right Sinus: No maxillary sinus tenderness or frontal sinus tenderness.     Left Sinus: No maxillary sinus tenderness or frontal sinus tenderness.     Mouth/Throat:     Pharynx: Uvula midline. Posterior oropharyngeal erythema (mild) present. No oropharyngeal exudate.     Tonsils: No tonsillar abscesses.  Eyes:     General: No scleral icterus.    Conjunctiva/sclera: Conjunctivae normal.     Pupils: Pupils are equal, round, and reactive to light.  Neck:     Musculoskeletal: Normal range of motion and neck supple.  Cardiovascular:     Rate and Rhythm: Normal rate and regular rhythm.     Pulses: Normal pulses.     Heart sounds: Normal heart sounds. No murmur.  Pulmonary:     Effort: Pulmonary effort is normal. No respiratory distress.     Breath sounds: Normal breath sounds. No wheezing, rhonchi or rales.     Comments: Lungs clear Lymphadenopathy:     Cervical: No cervical adenopathy.  Skin:    General: Skin is warm and dry.     Findings: No rash.       Assessment & Plan:   Problem List Items Addressed This Visit    Acute respiratory infection - Primary    Possible developing sinusitis. Anticipate viral given short duration. Supportive care reviewed. Red flags to notify us for abx course reviewed. Pt agrees with plan.           No orders of the defined types were placed in this encounter.  No orders of the defined types were placed in this encounter.  Patient Instructions  I think you have a viral upper respiratory infection. Antibiotics are not needed for this. Viral infections usually take 7-10 days to resolve. The cough can  last a few weeks to go away. May continue dayquil, nyquil, vick vaporub. Take plain mucinex with plenty of water to mobilize mucous.  Push fluids and plenty of rest.  Please return if you are not improving as expected, or if you have high fevers (>101.5) or difficulty swallowing or worsening productive cough.    Follow up plan: No follow-ups on file.  [name], MD

## 2018-06-21 NOTE — Patient Instructions (Addendum)
I think you have a viral upper respiratory infection. Antibiotics are not needed for this. Viral infections usually take 7-10 days to resolve. The cough can last a few weeks to go away. May continue dayquil, nyquil, vick vaporub. Take plain mucinex with plenty of water to mobilize mucous.  Push fluids and plenty of rest.  Please return if you are not improving as expected, or if you have high fevers (>101.5) or difficulty swallowing or worsening productive cough.

## 2018-07-05 ENCOUNTER — Encounter (HOSPITAL_COMMUNITY): Payer: Self-pay | Admitting: *Deleted

## 2018-07-05 ENCOUNTER — Other Ambulatory Visit: Payer: Self-pay

## 2018-07-05 NOTE — Anesthesia Preprocedure Evaluation (Addendum)
Anesthesia Evaluation  Patient identified by MRN, date of birth, ID band Patient awake    Reviewed: Allergy & Precautions, NPO status , Patient's Chart, lab work & pertinent test results  Airway Mallampati: I  TM Distance: >3 FB Neck ROM: Full    Dental no notable dental hx. (+) Missing, Dental Advisory Given,    Pulmonary sleep apnea (does not wear CPAP) , former smoker,    Pulmonary exam normal breath sounds clear to auscultation       Cardiovascular hypertension, + CAD, + Past MI and + Cardiac Stents  Normal cardiovascular examDysrhythmias: PAF   Rhythm:Regular Rate:Normal  TTE 2016: EF 40-45%, moderate hypokinesis of the anteroseptal, anterior, anterolateral, and apical myocardium, mild AR, mild MR, PASP 54mHg   Left heart cath 2016: no significant residual occlusive disease   Neuro/Psych negative neurological ROS  negative psych ROS   GI/Hepatic Neg liver ROS, GERD  Medicated,  Endo/Other  negative endocrine ROS  Renal/GU negative Renal ROS  negative genitourinary   Musculoskeletal negative musculoskeletal ROS (+)   Abdominal   Peds  Hematology negative hematology ROS (+)   Anesthesia Other Findings Duodenal stricture  Reproductive/Obstetrics                            Anesthesia Physical Anesthesia Plan  ASA: III  Anesthesia Plan: MAC   Post-op Pain Management:    Induction: Intravenous  PONV Risk Score and Plan: 1 and Treatment may vary due to age or medical condition  Airway Management Planned: Natural Airway  Additional Equipment:   Intra-op Plan:   Post-operative Plan:   Informed Consent: I have reviewed the patients History and Physical, chart, labs and discussed the procedure including the risks, benefits and alternatives for the proposed anesthesia with the patient or authorized representative who has indicated his/her understanding and acceptance.     Dental  advisory given  Plan Discussed with: CRNA  Anesthesia Plan Comments:        Anesthesia Quick Evaluation

## 2018-07-05 NOTE — Progress Notes (Signed)
Spoke with pt for pre-op call. Spoke with pt a few weeks ago for same. He states that nothing has changed with his medical and surgical history. Pt has not been on Aspirin since August 2019.  EKG - 01/07/18 Echo - 04/28/15 Cath - 04/30/15

## 2018-07-06 ENCOUNTER — Other Ambulatory Visit: Payer: Self-pay

## 2018-07-06 ENCOUNTER — Ambulatory Visit (HOSPITAL_COMMUNITY): Payer: Medicare Other | Admitting: Anesthesiology

## 2018-07-06 ENCOUNTER — Encounter (HOSPITAL_COMMUNITY): Payer: Self-pay | Admitting: *Deleted

## 2018-07-06 ENCOUNTER — Encounter (HOSPITAL_COMMUNITY)
Admission: RE | Disposition: A | Discharge status: Home / Self Care | Payer: Self-pay | Source: Home / Self Care | Attending: Gastroenterology

## 2018-07-06 ENCOUNTER — Ambulatory Visit (HOSPITAL_COMMUNITY)
Admission: RE | Admit: 2018-07-06 | Discharge: 2018-07-06 | Disposition: A | Discharge status: Home / Self Care | Payer: Medicare Other | Attending: Gastroenterology | Admitting: Gastroenterology

## 2018-07-06 DIAGNOSIS — I10 Essential (primary) hypertension: Secondary | ICD-10-CM | POA: Insufficient documentation

## 2018-07-06 DIAGNOSIS — I2510 Atherosclerotic heart disease of native coronary artery without angina pectoris: Secondary | ICD-10-CM | POA: Insufficient documentation

## 2018-07-06 DIAGNOSIS — G4733 Obstructive sleep apnea (adult) (pediatric): Secondary | ICD-10-CM | POA: Insufficient documentation

## 2018-07-06 DIAGNOSIS — Z8612 Personal history of poliomyelitis: Secondary | ICD-10-CM | POA: Diagnosis not present

## 2018-07-06 DIAGNOSIS — K315 Obstruction of duodenum: Secondary | ICD-10-CM | POA: Diagnosis not present

## 2018-07-06 DIAGNOSIS — K219 Gastro-esophageal reflux disease without esophagitis: Secondary | ICD-10-CM | POA: Diagnosis not present

## 2018-07-06 DIAGNOSIS — Z980 Intestinal bypass and anastomosis status: Secondary | ICD-10-CM | POA: Diagnosis not present

## 2018-07-06 DIAGNOSIS — K3189 Other diseases of stomach and duodenum: Secondary | ICD-10-CM | POA: Diagnosis not present

## 2018-07-06 DIAGNOSIS — Z955 Presence of coronary angioplasty implant and graft: Secondary | ICD-10-CM | POA: Insufficient documentation

## 2018-07-06 DIAGNOSIS — Z87891 Personal history of nicotine dependence: Secondary | ICD-10-CM | POA: Diagnosis not present

## 2018-07-06 DIAGNOSIS — I252 Old myocardial infarction: Secondary | ICD-10-CM | POA: Diagnosis not present

## 2018-07-06 HISTORY — PX: BALLOON DILATION: SHX5330

## 2018-07-06 HISTORY — PX: ESOPHAGOGASTRODUODENOSCOPY (EGD) WITH PROPOFOL: SHX5813

## 2018-07-06 SURGERY — ESOPHAGOGASTRODUODENOSCOPY (EGD) WITH PROPOFOL
Anesthesia: Monitor Anesthesia Care

## 2018-07-06 MED ORDER — PROPOFOL 10 MG/ML IV BOLUS
INTRAVENOUS | Status: DC | PRN
  Administered 2018-07-06: 20 mg via INTRAVENOUS
  Administered 2018-07-06: 10 mg via INTRAVENOUS

## 2018-07-06 MED ORDER — PROPOFOL 500 MG/50ML IV EMUL
INTRAVENOUS | Status: DC | PRN
  Administered 2018-07-06: 135 ug/kg/min via INTRAVENOUS

## 2018-07-06 MED ORDER — LACTATED RINGERS IV SOLN
INTRAVENOUS | Status: AC | PRN
  Administered 2018-07-06: 1000 mL via INTRAVENOUS
  Administered 2018-07-06: 08:00:00 via INTRAVENOUS

## 2018-07-06 SURGICAL SUPPLY — 14 items

## 2018-07-06 NOTE — Anesthesia Postprocedure Evaluation (Signed)
Anesthesia Post Note  Patient: Alexander [name]  Procedure(s) Performed: ESOPHAGOGASTRODUODENOSCOPY (EGD) WITH PROPOFOL (N/A ) BALLOON DILATION (N/A )     Patient location during evaluation: Endoscopy Anesthesia Type: MAC Level of consciousness: awake and alert Pain management: pain level controlled Vital Signs Assessment: post-procedure vital signs reviewed and stable Respiratory status: spontaneous breathing, nonlabored ventilation, respiratory function stable and patient connected to nasal cannula oxygen Cardiovascular status: blood pressure returned to baseline and stable Postop Assessment: no apparent nausea or vomiting Anesthetic complications: no    Last Vitals:  Vitals:   07/06/18 0910 07/06/18 0915  BP: 106/74 113/76  Pulse: (!) 57 (!) 52  Resp: 16 10  Temp:    SpO2: 96% 96%    Last Pain:  Vitals:   07/06/18 0915  TempSrc:   PainSc: 0-No pain                 Kadynce Bonds L Addisynn Vassell

## 2018-07-06 NOTE — Op Note (Signed)
[facility] Patient Name: Alexander [name] Procedure Date : 07/06/2018 MRN: [id number] Attending MD: Justice Britain , MD Date of Birth: 08-16-1950 CSN: [id number] Age: 68 Admit Type: Outpatient Procedure:                Upper GI endoscopy Indications:              Stenosis of the duodenum, Follow-up of duodenal                            stenosis, For therapy of duodenal stenosis                            (Previously dilation up to 16.5 mm) Providers:                Justice Britain, MD, Carlyn Reichert, RN, Charolette Child, Technician, Trixie Deis, CRNA Referring MD:             [name] Medicines:                Monitored Anesthesia Care Complications:            No immediate complications. Estimated Blood Loss:     Estimated blood loss was minimal. Procedure:                Pre-Anesthesia Assessment:                           - Prior to the procedure, a History and Physical                            was performed, and patient medications and                            allergies were reviewed. [name] tolerance of                            previous anesthesia was also reviewed. The risks                            and benefits of the procedure and the sedation                            options and risks were discussed with the patient.                            All questions were answered, and informed consent                            was obtained. Prior Anticoagulants: The patient has                            taken aspirin, last dose was day of procedure. ASA  Grade Assessment: II - A patient with mild systemic                            disease. After reviewing the risks and benefits,                            the patient was deemed in satisfactory condition to                            undergo the procedure.                           After obtaining informed consent, the endoscope was                      passed under direct vision. Throughout the                            procedure, the patient's blood pressure, pulse, and                            oxygen saturations were monitored continuously. The                            [id number] ([id number]) Olympus therapeutic                            gastroscope was introduced through the mouth, and                            advanced to the third part of duodenum. The upper                            GI endoscopy was accomplished without difficulty.                            The patient tolerated the procedure. Scope In: Scope Out: Findings:      No gross lesions were noted in the entire esophagus.      A J-shaped deformity was found in the stomach.      An acquired benign-appearing, intrinsic moderate stenosis was found in       the second portion of the duodenum just proximal to the ampulla and was       traversed with the therapeutic upper endoscope. A TTS dilator was passed       through the scope. Dilation with a 15-16.5-18 mm pyloric balloon dilator       was performed up to 18 mm sequentially each over 1 minute in time. The       dilation site was examined and showed mild mucosal disruption, mild       improvement in luminal narrowing and no perforation.      There was evidence of a patent sphincterotomy (though only visualized       with forward viewing endoscope).      A mild deformity was found in the angle/turns of the second portion of       the duodenum  into the third portion of the duodenum. Not clear if this a       result of adhesive disease but no mass/lesion noted.      Normal mucosa was found in the second and third portions of the duodenum. Impression:               - No gross lesions in esophagus.                           - J-shaped gastric deformity.                           - Acquired duodenal stenosis just proximal to the                            ampulla. Dilated to 18 mm sequentially.                            - Patent sphincterotomy was found.                           - Mild deformity in the angle/turns of the D2/D3                            region - not clear if a result of adhesive disease.                            However, normal mucosa was found in the second and                            third portions of the duodenum. Recommendation:           - The patient will be observed post-procedure,                            until all discharge criteria are met.                           - Discharge patient to home.                           - Patient has a contact number available for                            emergencies. The signs and symptoms of potential                            delayed complications were discussed with the                            patient. Return to normal activities tomorrow.                            Written discharge instructions were provided to the  patient.                           - Full liquid diet today and then by tomorrow                            advance to soft, low-fiber/low-residue diet                            thereafter.                           - Continue Omeprazole 40 mg BID for next 6-weeks.                           - Follow up in clinic in 3-4 weeks to see how he is                            doing.                           - Repeat upper endoscopy PRN for retreatment and                            may consider role of UGI series                           - The findings and recommendations were discussed                            with the patient.                           - The findings and recommendations were discussed                            with the patient's family. Procedure Code(s):        --- Professional ---                           [id number], Esophagogastroduodenoscopy, flexible,                            transoral; with dilation of gastric/duodenal                             stricture(s) (eg, balloon, bougie) Diagnosis Code(s):        --- Professional ---                           Z98.0, Intestinal bypass and anastomosis status                           Z98.890, Other specified postprocedural states                           K31.5, Obstruction  of duodenum                           K31.89, Other diseases of stomach and duodenum CPT copyright 2018 [employer]. All rights reserved. The codes documented in this report are preliminary and upon coder review may  be revised to meet current compliance requirements. Justice Britain, MD 07/06/2018 9:05:56 AM Number of Addenda: 0

## 2018-07-06 NOTE — Transfer of Care (Signed)
Immediate Anesthesia Transfer of Care Note  Patient: Alexander [name]  Procedure(s) Performed: ESOPHAGOGASTRODUODENOSCOPY (EGD) WITH PROPOFOL (N/A ) BALLOON DILATION (N/A )  Patient Location: Endoscopy Unit  Anesthesia Type:MAC  Level of Consciousness: sedated  Airway & Oxygen Therapy: Patient Spontanous Breathing and Patient connected to nasal cannula oxygen  Post-op Assessment: Report given to RN and Post -op Vital signs reviewed and stable  Post vital signs: Reviewed and stable  Last Vitals:  Vitals Value Taken Time  BP    Temp    Pulse    Resp    SpO2      Last Pain:  Vitals:   07/06/18 0727  TempSrc: Oral  PainSc: 0-No pain         Complications: No apparent anesthesia complications

## 2018-07-06 NOTE — Anesthesia Procedure Notes (Addendum)
Procedure Name: MAC Date/Time: 07/06/2018 8:20 AM Performed by: Leonor Liv, CRNA Pre-anesthesia Checklist: Patient identified, Emergency Drugs available, Suction available, Patient being monitored and Timeout performed Patient Re-evaluated:Patient Re-evaluated prior to induction Oxygen Delivery Method: Nasal cannula Airway Equipment and Method: Bite block Placement Confirmation: positive ETCO2 Dental Injury: Teeth and Oropharynx as per pre-operative assessment

## 2018-07-06 NOTE — Interval H&P Note (Signed)
History and Physical Interval Note:  07/06/2018 7:44 AM  Alexander [name]  has presented today for surgery, with the diagnosis of duodenal stricture-stenosis dilation  The various methods of treatment have been discussed with the patient and family. After consideration of risks, benefits and other options for treatment, the patient has consented to  Procedure(s): ESOPHAGOGASTRODUODENOSCOPY (EGD) WITH PROPOFOL (N/A) as a surgical intervention .  [name] history has been reviewed, patient examined, no change in status, stable for surgery.  I have reviewed the patient's chart and labs.  Questions were answered to the patient's satisfaction.     [employer]

## 2018-07-06 NOTE — Discharge Instructions (Signed)
YOU HAD AN ENDOSCOPIC PROCEDURE TODAY: Refer to the procedure report and other information in the discharge instructions given to you for any specific questions about what was found during the examination. If this information does not answer your questions, please call [place] office at [phone] to clarify.   YOU SHOULD EXPECT: Some feelings of bloating in the abdomen. Passage of more gas than usual. Walking can help get rid of the air that was put into your GI tract during the procedure and reduce the bloating. If you had a lower endoscopy (such as a colonoscopy or flexible sigmoidoscopy) you may notice spotting of blood in your stool or on the toilet paper. Some abdominal soreness may be present for a day or two, also.  DIET: Your first meal following the procedure should be a light meal and then it is ok to progress to your normal diet. A half-sandwich or bowl of soup is an example of a good first meal. Heavy or fried foods are harder to digest and may make you feel nauseous or bloated. Drink plenty of fluids but you should avoid alcoholic beverages for 24 hours. If you had a esophageal dilation, please see attached instructions for diet.    ACTIVITY: Your care partner should take you home directly after the procedure. You should plan to take it easy, moving slowly for the rest of the day. You can resume normal activity the day after the procedure however YOU SHOULD NOT DRIVE, use power tools, machinery or perform tasks that involve climbing or major physical exertion for 24 hours (because of the sedation medicines used during the test).   SYMPTOMS TO REPORT IMMEDIATELY: A gastroenterologist can be reached at any hour. Please call [phone]  for any of the following symptoms:   Following upper endoscopy (EGD, EUS, ERCP, esophageal dilation) Vomiting of blood or coffee ground material  New, significant abdominal pain  New, significant chest pain or pain under the shoulder blades  Painful or  persistently difficult swallowing  New shortness of breath  Black, tarry-looking or red, bloody stools  FOLLOW UP:  If any biopsies were taken you will be contacted by phone or by letter within the next 1-3 weeks. Call [phone]  if you have not heard about the biopsies in 3 weeks.  Please also call with any specific questions about appointments or follow up tests.  

## 2018-07-07 ENCOUNTER — Encounter (HOSPITAL_COMMUNITY): Payer: Self-pay | Admitting: Gastroenterology

## 2018-07-13 ENCOUNTER — Encounter: Payer: Self-pay | Admitting: Cardiology

## 2018-07-13 ENCOUNTER — Ambulatory Visit (INDEPENDENT_AMBULATORY_CARE_PROVIDER_SITE_OTHER): Payer: Medicare Other | Admitting: Cardiology

## 2018-07-13 VITALS — BP 126/78 | HR 73 | Ht 70.0 in | Wt 201.0 lb

## 2018-07-13 DIAGNOSIS — I2510 Atherosclerotic heart disease of native coronary artery without angina pectoris: Secondary | ICD-10-CM | POA: Diagnosis not present

## 2018-07-13 DIAGNOSIS — I255 Ischemic cardiomyopathy: Secondary | ICD-10-CM

## 2018-07-13 DIAGNOSIS — E785 Hyperlipidemia, unspecified: Secondary | ICD-10-CM

## 2018-07-13 DIAGNOSIS — Z9861 Coronary angioplasty status: Secondary | ICD-10-CM

## 2018-07-13 DIAGNOSIS — I10 Essential (primary) hypertension: Secondary | ICD-10-CM

## 2018-07-13 DIAGNOSIS — J849 Interstitial pulmonary disease, unspecified: Secondary | ICD-10-CM

## 2018-07-13 DIAGNOSIS — I480 Paroxysmal atrial fibrillation: Secondary | ICD-10-CM

## 2018-07-13 MED ORDER — ATORVASTATIN CALCIUM 80 MG PO TABS: 80.0000 mg | ORAL_TABLET | Freq: Every day | ORAL | 3 refills | Status: DC

## 2018-07-13 MED ORDER — LISINOPRIL 2.5 MG PO TABS: 2.5000 mg | ORAL_TABLET | Freq: Every day | ORAL | 3 refills | Status: DC

## 2018-07-13 NOTE — Patient Instructions (Signed)
Medication Instructions:  FOR THE NEXT 3 MONTHS TAKE 40 MG ATORVASTATIN ( 1/2  TABLET OF 80 MG ), THEN INCREASE TO 80 MG DAILY, STARTING June 2020. If you need a refill on your cardiac medications before your next appointment, please call your pharmacy.   Lab work:  LIPID , CMP TODAY  If you have labs (blood work) drawn today and your tests are completely normal, you will receive your results only by: Marland Kitchen MyChart Message (if you have MyChart) OR . A paper copy in the mail If you have any lab test that is abnormal or we need to change your treatment, we will call you to review the results.  Testing/Procedures: NOT NEEDED  Follow-Up: At [facility], you and your health needs are our priority.  As part of our continuing mission to provide you with exceptional heart care, we have created designated Provider Care Teams.  These Care Teams include your primary Cardiologist (physician) and Advanced Practice Providers (APPs -  Physician Assistants and Nurse Practitioners) who all work together to provide you with the care you need, when you need it. You will need a follow up appointment in 12 months FEB 2021.  Please call our office 2 months in advance to schedule this appointment.  You may see Glenetta Hew, MD or one of the following Advanced Practice Providers on your designated Care Team:   Rosaria Ferries, PA-C . Jory Sims, DNP, ANP  Any Other Special Instructions Will Be Listed Below (If Applicable).

## 2018-07-13 NOTE — Progress Notes (Signed)
PCP: [name], MD  Clinic Note: Chief Complaint  Patient presents with  . Follow-up    Multiple hospitalizations.  Stable cardiac symptoms.  . Coronary Artery Disease    No angina   1. CAD S/P percutaneous coronary angioplasty   2. PAF (paroxysmal atrial fibrillation) ([place])   3. Ischemic cardiomyopathy   4. Essential hypertension   5. Hyperlipidemia with target LDL less than 70    HPI: Alexander [name] is a 68 y.o. male with a PMH notable for CAD-mild ICM & 1 episode of sepsis related PAF who presents today for annual follow-up/.  He has a history of polio and has significant leg atrophy.  He has bilateral leg braces and walks with 2 canes.  Despite this he is very active and usually does circuits around his backyard.  He does have a scooter which he uses for going long distances.  No longer on Xarelto     (04/2015: Sepsis from Cholecystitis --> Septic --> Afib RVR with + Troponin & reduced LVEF --> DCCV & Amiod, Cath revealed patent stents & some improvement in EF. -->  Had cholecystectomy in January 2017)  [facility] was last seen in October 2017 recovering from his cholecystitis admission --no further A. fib.  Xarelto was stopped.  I last saw him in Nov 2018 -he was doing very well from a cardiac standpoint.  No chest pain or pressure.  No recurrence of any rapid irregular heartbeats or palpitations to suggest A. fib.  Recent Hospitalizations:   8/9-13/2019: Admitted with acute ascending cholangitis and septic shock from E. coli bacteremia.  Was in the ICU for several days.  Transition from IV to oral antibiotics.  Had an ERCP with stent placement in the common bile duct  Several follow-up procedures noted below.  No further hospitalizations  Studies Personally Reviewed - (if available, images/films reviewed: From Epic Chart or Care Everywhere)  Follow-up ERCP on 04/20/2018 -partially occluded biliary stent.  Choledocholithiasis/sludge --> partial removal and  sphincteroplasty and stent insertion.  ERCP again on March 30, 2018: Stent from major papilla removed.  Stone/sludge is seen --> balloon dilation of biliary duct performed.  Complete removal and significant balloon sweeping.  Colonoscopy and Endoscopy June 06 2018: Nonthrombosed internal hemorrhoid.  2 mm polyp at hepatic flexure resected and retrieved.  Otherwise normal mucosa.;;  Acquired duodenal stenosis dilated.  Patent stent durotomy.  Upper Endoscopy July 06, 2018: Acquired duodenal stenosis just proximal to ambulate dilated to 18 mm.  Patent stent durotomy noted.  Interval History: Alexander [name] returns finally starting to feel little bit more like himself.  He is starting to regain his strength back but still notes that he is somewhat fatigued with doing his routine activities.  He finally has started to try to get back into his exercise regimen, but is somewhat frustrated because he is so far behind what he used to be.  He realizes this will be relatively slow recovery. His aspirin had been on and off doing all the procedures, but is now back on.  He never was restarted on Xarelto. During all of his hospitalization, some of his medications have been adjusted including his Lipitor dose having been reduced to 40 mg during our last visit.  Pretty much for cardiac standpoint he has been stable with no active symptoms.  No PND, orthopnea or edema.  No sensation of rapid irregular heartbeat/palpitations.  No chest pain or pressure with rest or exertion.  Thankfully no melena, hematochezia or hematuria.  No syncope or near syncope, TIA/amaurosis fugax.  He does have some muscle aching at the end of the day, but at this point is probably more because of deconditioning.  ROS: A comprehensive was performed. Review of Systems  Constitutional: Negative for malaise/fatigue.  HENT: Negative for congestion.   Respiratory: Negative for cough, shortness of breath and wheezing.   Cardiovascular: Negative.    Musculoskeletal: Positive for falls (He had at least 1 or 2 falls where his crutches slipped and he fell) and myalgias (Muscle aches, mostly in his upper body having to support his weight.). Negative for joint pain.  Skin:       He takes extensive precautions to avoid injury from his braces.  He has to wear extra pairs of socks to prevent pressure wounds.  Neurological: Negative for dizziness and headaches.       Post polio weakness of his legs.  Endo/Heme/Allergies: Negative for environmental allergies.  Psychiatric/Behavioral: Negative.  Negative for memory loss. The patient does not have insomnia.   All other systems reviewed and are negative.  I have reviewed and (if needed) personally updated the patient's problem list, medications, allergies, past medical and surgical history, social and family history.   Past Medical History:  Diagnosis Date  . Acute cholecystitis 03/2016   with sepsis  . Arthritis   . CAD S/P percutaneous coronary angioplasty 1995, 2000, 2005   a. 1995 s/p BMS;  b. 2000 s/p BMS;  c. 2005 s/p stent - All stents in [facility] (RCA, LAD & OM - unknown on which date);  d. 04/2015 NSTEMI/Cath: LM nl, ost/p LAD 20%, patent mLAD stent, RI small, OM2 patent stent, pRCA 20%, patent stent, EF 45-50%-->Med Rx.  . Calculus of bile duct with acute cholangitis with obstruction   . Chronic pain syndrome    a. Followed [name];  b. Uses 12-14 excedrins per day.  . Essential hypertension   . Fatty liver   . Gallstones   . GERD (gastroesophageal reflux disease)   . History of depression   . History of post poliomyelitis muscular atrophy   . HLD (hyperlipidemia)   . Ischemic cardiomyopathy    a. 04/2015 Echo: EF 40-45%, mod antsept, ant, antlat, apical HK, mild AI/MR, PASP 34mmHg.  . Migraines   . 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 NSTEMI (non-ST elevated myocardial infarction) ([place]) 04/2015   - [place]and Ischemia in setting of Sepsis (also with prior MI  history  . OSA (obstructive sleep apnea)    does not want to use CPAP, using mouth guard  . PAF (paroxysmal atrial fibrillation) ([place])    a. 04/2015 in setting of cholecystits and sepsis -->Amio;  b. CHA2DS2VASc = 4--> Eliquis.  . Pneumonia 04/2015  . Post-polio syndrome    a. ambulates with braces.  . Shoulder pain, right     Past Surgical History:  Procedure Laterality Date  . BALLOON DILATION N/A 06/06/2018   Procedure: BALLOON DILATION;  Surgeon: Rush Landmark Telford Nab., MD;  Location: [place];  Service: Gastroenterology;  Laterality: N/A;  . BALLOON DILATION N/A 07/06/2018   Procedure: BALLOON DILATION;  Surgeon: Irving Copas., MD;  Location: [place];  Service: Gastroenterology;  Laterality: N/A;  . BILIARY STENT PLACEMENT  01/08/2018   Procedure: BILIARY STENT PLACEMENT;  Surgeon: Jackquline Denmark, MD;  Location: [facility] ENDOSCOPY;  Service: Endoscopy;;  . BILIARY STENT PLACEMENT N/A 02/09/2018   Procedure: BILIARY STENT PLACEMENT;  Surgeon: Irving Copas., MD;  Location: [facility] ENDOSCOPY;  Service: Gastroenterology;  Laterality: N/A;  . BIOPSY  06/06/2018   Procedure: BIOPSY;  Surgeon: Rush Landmark Telford Nab., MD;  Location: [facility] ENDOSCOPY;  Service: Gastroenterology;;  . CARDIAC CATHETERIZATION N/A 04/30/2015   Procedure: Left Heart Cath and Coronary Angiography;  Surgeon: Troy Sine, MD;  Location: [place] CV LAB;  Service: Cardiovascular; LM nl, ost/p LAD 20%, patent mLAD stent, RI small, OM2 patent stent, pRCA 20%, patent stent, EF 45-50%-->Med Rx   . cervical spine stimulator  08/2012  . CHOLECYSTECTOMY  06/10/2015   Procedure: LAPAROSCOPIC CHOLECYSTECTOMY;  Surgeon: Mickeal Skinner, MD;  Location: [place];  Service: General;;  . COLONOSCOPY  09/2013   TA x2, rpt 5 yrs ([place])  . COLONOSCOPY WITH ESOPHAGOGASTRODUODENOSCOPY (EGD)  06/2018   reactive gastropathy, tubular adenoma, rpt colonoscopy 5 yrs ([place])  . COLONOSCOPY WITH PROPOFOL N/A 06/06/2018    Procedure: COLONOSCOPY WITH PROPOFOL;  Surgeon: Rush Landmark Telford Nab., MD;  Location: [place];  Service: Gastroenterology;  Laterality: N/A;  . CORONARY ANGIOPLASTY WITH STENT PLACEMENT  1995, 2000, 2005   stents in RCA, LAD & Cx-OM. -Stents were patent by cardiac catheterization in November 2016  . ENDOSCOPIC RETROGRADE CHOLANGIOPANCREATOGRAPHY (ERCP) WITH PROPOFOL N/A 02/09/2018   Procedure: ENDOSCOPIC RETROGRADE CHOLANGIOPANCREATOGRAPHY (ERCP) WITH PROPOFOL;  Surgeon: Rush Landmark Telford Nab., MD;  Location: [facility] ENDOSCOPY;  Service: Gastroenterology;  Laterality: N/A;  . ENDOSCOPIC RETROGRADE CHOLANGIOPANCREATOGRAPHY (ERCP) WITH PROPOFOL N/A 03/30/2018   Procedure: ENDOSCOPIC RETROGRADE CHOLANGIOPANCREATOGRAPHY (ERCP) WITH PROPOFOL;  Surgeon: Rush Landmark Telford Nab., MD;  Location: [facility] ENDOSCOPY;  Service: Gastroenterology;  Laterality: N/A;  . ERCP N/A 01/08/2018   Procedure: ENDOSCOPIC RETROGRADE CHOLANGIOPANCREATOGRAPHY (ERCP);  Surgeon: Jackquline Denmark, MD;  Location: [facility] ENDOSCOPY;  Service: Endoscopy;  Laterality: N/A;  . ESOPHAGOGASTRODUODENOSCOPY N/A 03/30/2018   Procedure: ESOPHAGOGASTRODUODENOSCOPY (EGD);  Surgeon: Irving Copas., MD;  Location: [facility];  Service: Gastroenterology;  Laterality: N/A;  . ESOPHAGOGASTRODUODENOSCOPY (EGD) WITH PROPOFOL N/A 06/06/2018   Procedure: ESOPHAGOGASTRODUODENOSCOPY (EGD) WITH PROPOFOL;  Surgeon: Rush Landmark Telford Nab., MD;  Location: [place];  Service: Gastroenterology;  Laterality: N/A;  . ESOPHAGOGASTRODUODENOSCOPY (EGD) WITH PROPOFOL N/A 07/06/2018   Procedure: ESOPHAGOGASTRODUODENOSCOPY (EGD) WITH PROPOFOL;  Surgeon: Rush Landmark Telford Nab., MD;  Location: [place];  Service: Gastroenterology;  Laterality: N/A;  . lumbar spine stimulator    . POLYPECTOMY  06/06/2018   Procedure: POLYPECTOMY;  Surgeon: Mansouraty, Telford Nab., MD;  Location: [place];  Service: Gastroenterology;;  . REMOVAL OF STONES  03/30/2018    Procedure: REMOVAL OF STONES;  Surgeon: Irving Copas., MD;  Location: [facility];  Service: Gastroenterology;;  . Joan Mayans  01/08/2018   Procedure: Joan Mayans;  Surgeon: Jackquline Denmark, MD;  Location: [place];  Service: Endoscopy;;  . Joan Mayans  02/09/2018   Procedure: SPHINCTEROTOMY;  Surgeon: Mansouraty, Telford Nab., MD;  Location: [facility];  Service: Gastroenterology;;  . Lavell Islam REMOVAL  02/09/2018   Procedure: STENT REMOVAL;  Surgeon: Irving Copas., MD;  Location: [facility];  Service: Gastroenterology;;  . Lavell Islam REMOVAL  03/30/2018   Procedure: STENT REMOVAL;  Surgeon: Irving Copas., MD;  Location: [facility] ENDOSCOPY;  Service: Gastroenterology;;  . TRANSTHORACIC ECHOCARDIOGRAM  04/2015   EF 40-45%, mod antsept, ant, antlat, apical HK, mild AI/MR, PASP 52mmHg.  Marland Kitchen UVULOPALATOPHARYNGOPLASTY, TONSILLECTOMY AND SEPTOPLASTY  2002    Current Meds  Medication Sig  . acetaminophen (TYLENOL) 500 MG tablet Take 1,000 mg by mouth every 6 (six) hours as needed for moderate pain or headache.   . Ascorbic Acid (VITAMIN C)  1000 MG tablet Take 1,000 mg by mouth daily.  Marland Kitchen atorvastatin (LIPITOR) 80 MG tablet Take 1 tablet (80 mg total) by mouth daily at 6 PM.  . Carboxymethylcellulose Sodium (LUBRICANT EYE DROPS OP) Place 2 drops into both eyes daily as needed (for dry eyes).  . Cholecalciferol (VITAMIN D3) 50 MCG (2000 UT) TABS Take 2,000 Units by mouth daily.  Marland Kitchen gabapentin (NEURONTIN) 600 MG tablet TAKE 2 TABLETS 3 TIMES A DAY (Patient taking differently: Take 1,200 mg by mouth 3 (three) times daily. )  . lisinopril (PRINIVIL,ZESTRIL) 2.5 MG tablet Take 1 tablet (2.5 mg total) by mouth daily.  . Magnesium 250 MG TABS Take 1 tablet (250 mg total) by mouth at bedtime. (Patient taking differently: Take 250 mg by mouth at bedtime. )  . Multiple Vitamin (MULTIVITAMIN WITH MINERALS) TABS tablet Take 1 tablet by mouth daily.  . Omega-3 Fatty Acids (FISH OIL) 1000  MG CAPS Take 1,000 mg by mouth daily.   Marland Kitchen omeprazole (PRILOSEC) 40 MG capsule Take 1 capsule (40 mg total) by mouth 2 (two) times daily.  . sildenafil (VIAGRA) 100 MG tablet TAKE ONE-HALF TO ONE TABLET BY MOUTH DAILY AS NEEDED FOR ERECTILE DYSFUNCTION (Patient taking differently: Take 50-100 mg by mouth daily as needed for erectile dysfunction. )  . vitamin B-12 (CYANOCOBALAMIN) 1000 MCG tablet Take 1,000 mcg by mouth daily.  . vitamin E 400 UNIT capsule Take 400 Units by mouth daily.  . [DISCONTINUED] atorvastatin (LIPITOR) 80 MG tablet Take 0.5 tablets (40 mg total) daily at 6 PM by mouth.  . [DISCONTINUED] atorvastatin (LIPITOR) 80 MG tablet Take 1 tablet (80 mg total) by mouth daily at 6 PM.  . [DISCONTINUED] lisinopril (PRINIVIL,ZESTRIL) 2.5 MG tablet TAKE 1 TABLET BY MOUTH EVERY DAY (Patient taking differently: Take 2.5 mg by mouth daily. )  . [DISCONTINUED] lisinopril (PRINIVIL,ZESTRIL) 2.5 MG tablet Take 1 tablet (2.5 mg total) by mouth daily.    No Known Allergies Social History   Tobacco Use  . Smoking status: Former Smoker    Types: Cigarettes    Last attempt to quit: 1972    Years since quitting: 48.1  . Smokeless tobacco: Never Used  . Tobacco comment: was very light  Substance Use Topics  . Alcohol use: Yes    Alcohol/week: 0.0 standard drinks    Comment: moderate  . 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  Wt Readings from Last 3 Encounters:  07/13/18 201 lb (91.2  kg)  07/06/18 220 lb 12 oz (100.1 kg)  06/21/18 220 lb 12 oz (100.1 kg)    PHYSICAL EXAM BP 126/78   Pulse 73   Ht 5\' 10"  (1.778 m)   Wt 201 lb (91.2 kg)   BMI 28.84 kg/m  Physical Exam  Constitutional: He is oriented to person, place, and time. He appears well-developed and well-nourished. No distress.  Healthy-appearing.  Well-groomed  HENT:  Head: Normocephalic and atraumatic.  Mouth/Throat: Oropharynx is clear and moist. No oropharyngeal exudate.  Eyes: Pupils are equal, round, and reactive to light. EOM are normal. No scleral icterus.  Neck: Neck supple. No JVD present.  Cardiovascular: Normal rate, regular rhythm and normal pulses.  No extrasystoles are present. PMI is not displaced. Exam reveals no gallop and no friction rub.  No murmur heard. Pulmonary/Chest: Effort normal and breath sounds normal. No respiratory distress. He has no wheezes. He has no rales.  Abdominal: Soft. Bowel sounds are normal. He exhibits no distension. There is no abdominal tenderness. There is no rebound.  Musculoskeletal:        General: No edema.     Comments: Both legs are in braces and he walks using 2 canes/crutches.   Overdeveloped upper body strength from constant crutch use.  Neurological: He is alert and oriented to person, place, and time.  Skin: He is not diaphoretic.  Psychiatric: He has a normal mood and affect. His behavior is normal. Judgment and thought content normal.  Vitals reviewed.    Adult ECG Report  Rate: 73;  Rhythm: normal sinus rhythm and LVH with repolarization changes.  Otherwise normal axis, intervals and durations;   Narrative Interpretation: Relatively normal, stable EKG  Other studies Reviewed: Additional studies/ records that were reviewed today include:  Recent Labs:    Lab Results  Component Value Date   CHOL 176 07/13/2018   HDL 51 07/13/2018   LDLCALC 105 (H) 07/13/2018   LDLDIRECT 213.1 06/29/2013   TRIG 101 07/13/2018   CHOLHDL 3.5 07/13/2018    --This was after reducing his dose of atorvastatin to 40.  ASSESSMENT / PLAN: Problem List Items Addressed This Visit    CAD S/P percutaneous coronary angioplasty - Primary (Chronic)    Last cath in 2016 showed relatively patent stents.  Minimal disease.  Is on aspirin, statin and ACE inhibitor.  Not on beta-blocker for reasons discussed.      Relevant Medications   lisinopril (PRINIVIL,ZESTRIL) 2.5 MG tablet   atorvastatin (LIPITOR) 80 MG tablet   Other Relevant Orders   EKG 12-Lead (Completed)   Comprehensive metabolic panel (Completed)   Essential hypertension (Chronic)    Probably not all that accurate at this point.  Remains on lisinopril.  Well-controlled      Relevant Medications   lisinopril (PRINIVIL,ZESTRIL) 2.5 MG tablet   atorvastatin (LIPITOR) 80 MG tablet   Hyperlipidemia with target LDL less than 70 (Chronic)    PCP recently told him to increase his statin dose to 80 mg every other day with 40 mg on the other day.  Due for follow-up labs soon.  I have asked him to simply increase to 80 mg daily until we recheck.      Relevant Medications   lisinopril (PRINIVIL,ZESTRIL) 2.5 MG tablet   atorvastatin (LIPITOR) 80 MG tablet   Other Relevant Orders   Lipid panel (Completed)   Comprehensive metabolic panel (Completed)   Interstitial lung disease ([place])    Being followed by PCP and potentially pulmonary medicine.  No current respiratory issues.  Would probably avoid long-term amiodarone but this was unlikely to be the etiology back then.      Ischemic cardiomyopathy (Chronic)    Only mild ischemic cardiomyopathy.  He is on low-dose lisinopril for afterload reduction.  Not currently on beta-blocker mostly because of fatigue issues.  For now continue to stay off beta-blocker while he is trying to recover from his hospitalizations.  If blood pressure or heart rate were to  increase, we can probably consider restarting low-dose beta-blocker.      Relevant Medications    lisinopril (PRINIVIL,ZESTRIL) 2.5 MG tablet   atorvastatin (LIPITOR) 80 MG tablet   Other Relevant Orders   EKG 12-Lead (Completed)   PAF (paroxysmal atrial fibrillation) ([place]); CHA2DS2VASc = 4--> Eliquis. (Chronic)    No recurrent symptoms.  On amiodarone.  I suspect that this was all related to his sepsis.  As such with no recurrence, we will not continue with full anticoagulation, especially given his multiple procedures.      Relevant Medications   lisinopril (PRINIVIL,ZESTRIL) 2.5 MG tablet   atorvastatin (LIPITOR) 80 MG tablet   Other Relevant Orders   EKG 12-Lead (Completed)      Current medicines are reviewed at length with the patient today. (+/- concerns) can he come off some meds The following changes have been made: see below  Patient Instructions  Medication Instructions:  FOR THE NEXT 3 MONTHS TAKE 40 MG ATORVASTATIN ( 1/2  TABLET OF 80 MG ), THEN INCREASE TO 80 MG DAILY, STARTING June 2020. If you need a refill on your cardiac medications before your next appointment, please call your pharmacy.   Lab work:  LIPID , CMP TODAY  If you have labs (blood work) drawn today and your tests are completely normal, you will receive your results only by: Marland Kitchen MyChart Message (if you have MyChart) OR . A paper copy in the mail If you have any lab test that is abnormal or we need to change your treatment, we will call you to review the results.  Testing/Procedures: NOT NEEDED  Follow-Up: At [facility], you and your health needs are our priority.  As part of our continuing mission to provide you with exceptional heart care, we have created designated Provider Care Teams.  These Care Teams include your primary Cardiologist (physician) and Advanced Practice Providers (APPs -  Physician Assistants and Nurse Practitioners) who all work together to provide you with the care you need, when you need it. You will need a follow up appointment in 12 months FEB 2021.  Please call our  office 2 months in advance to schedule this appointment.  You may see Glenetta Hew, MD or one of the following Advanced Practice Providers on your designated Care Team:   Rosaria Ferries, PA-C . Jory Sims, DNP, ANP  Any Other Special Instructions Will Be Listed Below (If Applicable).      Studies Ordered:   Orders Placed This Encounter  Procedures  . Lipid panel  . Comprehensive metabolic panel  . EKG 12-Lead      Glenetta Hew, M.D., M.S. Interventional Cardiologist   Pager # [phone] Phone # [phone] [address]. [place], [place] [address]

## 2018-07-14 LAB — COMPREHENSIVE METABOLIC PANEL
ALT: 27 IU/L (ref 0–44)
AST: 21 IU/L (ref 0–40)
Albumin/Globulin Ratio: 1.5 (ref 1.2–2.2)
Albumin: 4.5 g/dL (ref 3.8–4.8)
Alkaline Phosphatase: 81 IU/L (ref 39–117)
BUN/Creatinine Ratio: 22 (ref 10–24)
BUN: 14 mg/dL (ref 8–27)
Bilirubin Total: 0.5 mg/dL (ref 0.0–1.2)
CO2: 22 mmol/L (ref 20–29)
Calcium: 9.7 mg/dL (ref 8.6–10.2)
Chloride: 102 mmol/L (ref 96–106)
Creatinine, Ser: 0.65 mg/dL — ABNORMAL LOW (ref 0.76–1.27)
GFR calc Af Amer: 116 mL/min/{1.73_m2} (ref >59)
GFR calc non Af Amer: 100 mL/min/{1.73_m2} (ref >59)
GLUCOSE: 104 mg/dL — AB (ref 65–99)
Globulin, Total: 3.1 g/dL (ref 1.5–4.5)
Potassium: 4.5 mmol/L (ref 3.5–5.2)
Sodium: 139 mmol/L (ref 134–144)
Total Protein: 7.6 g/dL (ref 6.0–8.5)

## 2018-07-14 LAB — LIPID PANEL
Chol/HDL Ratio: 3.5 ratio (ref 0.0–5.0)
Cholesterol, Total: 176 mg/dL (ref 100–199)
HDL: 51 mg/dL (ref >39)
LDL Calculated: 105 mg/dL — ABNORMAL HIGH (ref 0–99)
Triglycerides: 101 mg/dL (ref 0–149)
VLDL Cholesterol Cal: 20 mg/dL (ref 5–40)

## 2018-07-16 ENCOUNTER — Encounter: Payer: Self-pay | Admitting: Cardiology

## 2018-07-16 NOTE — Assessment & Plan Note (Signed)
Being followed by PCP and potentially pulmonary medicine.  No current respiratory issues.  Would probably avoid long-term amiodarone but this was unlikely to be the etiology back then.

## 2018-07-16 NOTE — Assessment & Plan Note (Signed)
Only mild ischemic cardiomyopathy.  He is on low-dose lisinopril for afterload reduction.  Not currently on beta-blocker mostly because of fatigue issues.  For now continue to stay off beta-blocker while he is trying to recover from his hospitalizations.  If blood pressure or heart rate were to increase, we can probably consider restarting low-dose beta-blocker.

## 2018-07-16 NOTE — Assessment & Plan Note (Signed)
PCP recently told him to increase his statin dose to 80 mg every other day with 40 mg on the other day.  Due for follow-up labs soon.  I have asked him to simply increase to 80 mg daily until we recheck.

## 2018-07-16 NOTE — Assessment & Plan Note (Signed)
No recurrent symptoms.  On amiodarone.  I suspect that this was all related to his sepsis.  As such with no recurrence, we will not continue with full anticoagulation, especially given his multiple procedures.

## 2018-07-16 NOTE — Assessment & Plan Note (Signed)
Last cath in 2016 showed relatively patent stents.  Minimal disease.  Is on aspirin, statin and ACE inhibitor.  Not on beta-blocker for reasons discussed.

## 2018-07-16 NOTE — Assessment & Plan Note (Signed)
Probably not all that accurate at this point.  Remains on lisinopril.  Well-controlled

## 2018-07-27 ENCOUNTER — Encounter: Payer: Self-pay | Admitting: Family Medicine

## 2018-07-27 MED ORDER — OMEPRAZOLE 40 MG PO CPDR: 40.0000 mg | DELAYED_RELEASE_CAPSULE | Freq: Two times a day (BID) | ORAL | 0 refills | Status: DC

## 2018-07-27 NOTE — Telephone Encounter (Signed)
E-scribed omeprazole refill.  Gabapentin Last rx:  11/11/17, #540 Last OV:  06/21/18, acute Next OV:  11/29/18, CPE Pt 2

## 2018-07-29 MED ORDER — GABAPENTIN 600 MG PO TABS: ORAL_TABLET | ORAL | 3 refills | Status: DC

## 2018-08-16 ENCOUNTER — Other Ambulatory Visit: Payer: Self-pay

## 2018-08-16 ENCOUNTER — Ambulatory Visit (INDEPENDENT_AMBULATORY_CARE_PROVIDER_SITE_OTHER): Payer: Medicare Other | Admitting: Gastroenterology

## 2018-08-16 ENCOUNTER — Encounter: Payer: Self-pay | Admitting: Gastroenterology

## 2018-08-16 VITALS — BP 100/70 | HR 62 | Temp 98.9°F | Ht 69.0 in | Wt 222.4 lb

## 2018-08-16 DIAGNOSIS — K315 Obstruction of duodenum: Secondary | ICD-10-CM | POA: Diagnosis not present

## 2018-08-16 DIAGNOSIS — R194 Change in bowel habit: Secondary | ICD-10-CM | POA: Diagnosis not present

## 2018-08-16 MED ORDER — OMEPRAZOLE 40 MG PO CPDR: 40.0000 mg | DELAYED_RELEASE_CAPSULE | Freq: Every day | ORAL | 3 refills | Status: DC

## 2018-08-16 NOTE — Progress Notes (Signed)
[place] VISIT   Primary Care Provider [name], MD [place] [address] [phone]  Patient Profile: Alexander [name] is a 68 y.o. male with a pmh significant for CAD, HTN, pAFib, s/p CCK, hx of recent choledocholithiasis (status post ERCP with sphincterotomy and balloon sweeping), colon polyps (tubular adenoma), duodenal stricture (status post dilation).  The patient presents to the [facility] for an evaluation and management of problem(s) noted below:  Problem List 1. Duodenal stenosis   2. Change in bowel habits     History of Present Illness: Please see initial consultation note as well as follow-up notes for full details of HPI.    Interval History The patient returns for scheduled follow-up.  At the beginning of February I brought the patient back for a endoscopy with duodenal stricture dilation.  Since that time the patient has been doing very well.  The "rock hard feeling in abdomen after eating) has improved greatly after duodenal stricture dilation.  His weight has been stable to slightly increasing.  His bowel habits have been relatively good with bowel movements on a daily basis or every other day.  The previous issues with change in bowel habits has improved.  On his colonoscopy from January his biopsies were benign showing no evidence of any microscopic/collagenous colitis or inflammatory bowel disease.  He was found to have a tubular adenoma.  If he does get some constipation he will take the fiber supplement as well as MiraLAX as needed.  In regards to the patient's symptoms he does not have any early satiety.  He describes only significant issues if he overeats and notices that it will take time for things to pass.  If he does not overeat there are no issues.  The patient denies any significant abdominal pain or discomfort.  He describes no dysphagia symptoms at this time.  GI Review of  Systems Positive as above Negative for dysphagia, odynophagia, nausea, vomiting, pain, jaundice, change in bowel habits, melena, hematochezia  Review of Systems General: Denies fevers/chills Cardiovascular: Denies chest pain Pulmonary: Stable shortness of breath (at his baseline) Gastroenterological: See HPI Genitourinary: Denies darkened urine Dermatological: Denies jaundice Psychological: Mood is happy that things are going well   Medications Current Outpatient Medications  Medication Sig Dispense Refill   acetaminophen (TYLENOL) 500 MG tablet Take 1,000 mg by mouth every 6 (six) hours as needed for moderate pain or headache.      Ascorbic Acid (VITAMIN C) 1000 MG tablet Take 1,000 mg by mouth daily.     atorvastatin (LIPITOR) 80 MG tablet Take 1 tablet (80 mg total) by mouth daily at 6 PM. 90 tablet 3   Carboxymethylcellulose Sodium (LUBRICANT EYE DROPS OP) Place 2 drops into both eyes daily as needed (for dry eyes).     Cholecalciferol (VITAMIN D3) 50 MCG (2000 UT) TABS Take 2,000 Units by mouth daily.     gabapentin (NEURONTIN) 600 MG tablet TAKE 2 TABLETS 3 TIMES A DAY 540 tablet 3   lisinopril (PRINIVIL,ZESTRIL) 2.5 MG tablet Take 1 tablet (2.5 mg total) by mouth daily. 90 tablet 3   Magnesium 250 MG TABS Take 1 tablet (250 mg total) by mouth at bedtime. (Patient taking differently: Take 250 mg by mouth at bedtime. )  0   Multiple Vitamin (MULTIVITAMIN WITH MINERALS) TABS tablet Take 1 tablet by mouth daily.     Omega-3 Fatty Acids (FISH OIL) 1000 MG CAPS Take 1,000 mg by mouth daily.  omeprazole (PRILOSEC) 40 MG capsule Take 1 capsule (40 mg total) by mouth 2 (two) times daily. 180 capsule 0   sildenafil (VIAGRA) 100 MG tablet TAKE ONE-HALF TO ONE TABLET BY MOUTH DAILY AS NEEDED FOR ERECTILE DYSFUNCTION (Patient taking differently: Take 50-100 mg by mouth daily as needed for erectile dysfunction. ) 6 tablet 3   vitamin B-12 (CYANOCOBALAMIN) 1000 MCG tablet Take  1,000 mcg by mouth daily.     vitamin E 400 UNIT capsule Take 400 Units by mouth daily.     omeprazole (PRILOSEC) 40 MG capsule Take 1 capsule (40 mg total) by mouth daily. Start once daily omeprazole in April 2020 30 capsule 3   No current facility-administered medications for this visit.     Allergies No Known Allergies  Histories Past Medical History:  Diagnosis Date   Acute cholecystitis 03/2016   with sepsis   Arthritis    CAD S/P percutaneous coronary angioplasty 1995, 2000, 2005   a. 1995 s/p BMS;  b. 2000 s/p BMS;  c. 2005 s/p stent - All stents in [facility] (RCA, LAD & OM - unknown on which date);  d. 04/2015 NSTEMI/Cath: LM nl, ost/p LAD 20%, patent mLAD stent, RI small, OM2 patent stent, pRCA 20%, patent stent, EF 45-50%-->Med Rx.   Calculus of bile duct with acute cholangitis with obstruction    Chronic pain syndrome    a. Followed @ [facility];  b. Uses 12-14 excedrins per day.   Duodenal stricture    Essential hypertension    Fatty liver    Gallstones    GERD (gastroesophageal reflux disease)    History of depression    History of post poliomyelitis muscular atrophy    HLD (hyperlipidemia)    Ischemic cardiomyopathy    a. 04/2015 Echo: EF 40-45%, mod antsept, ant, antlat, apical HK, mild AI/MR, PASP 56mmHg.   Migraines    Neuropathy    due to post polio syndrome   NSTEMI (non-ST elevated myocardial infarction) ([place]) 04/2015   - [place] Ischemia in setting of Sepsis (also with prior MI history   OSA (obstructive sleep apnea)    does not want to use CPAP, using mouth guard   PAF (paroxysmal atrial fibrillation) ([place])    a. 04/2015 in setting of cholecystits and sepsis -->Amio;  b. CHA2DS2VASc = 4--> Eliquis.   Pneumonia 04/2015   Post-polio syndrome    a. ambulates with braces.   Shoulder pain, right    Past Surgical History:  Procedure Laterality Date   BALLOON DILATION N/A 06/06/2018   Procedure: BALLOON DILATION;  Surgeon:  Mansouraty, Telford Nab., MD;  Location: [place];  Service: Gastroenterology;  Laterality: N/A;   BALLOON DILATION N/A 07/06/2018   Procedure: BALLOON DILATION;  Surgeon: Rush Landmark Telford Nab., MD;  Location: [place];  Service: Gastroenterology;  Laterality: N/A;   BILIARY STENT PLACEMENT  01/08/2018   Procedure: BILIARY STENT PLACEMENT;  Surgeon: Jackquline Denmark, MD;  Location: [facility] ENDOSCOPY;  Service: Endoscopy;;   BILIARY STENT PLACEMENT N/A 02/09/2018   Procedure: BILIARY STENT PLACEMENT;  Surgeon: Irving Copas., MD;  Location: [facility] ENDOSCOPY;  Service: Gastroenterology;  Laterality: N/A;   BIOPSY  06/06/2018   Procedure: BIOPSY;  Surgeon: Rush Landmark Telford Nab., MD;  Location: [facility] ENDOSCOPY;  Service: Gastroenterology;;   CARDIAC CATHETERIZATION N/A 04/30/2015   Procedure: Left Heart Cath and Coronary Angiography;  Surgeon: Troy Sine, MD;  Location: [place] CV LAB;  Service: Cardiovascular; LM nl, ost/p LAD 20%, patent mLAD stent,  RI small, OM2 patent stent, pRCA 20%, patent stent, EF 45-50%-->Med Rx    cervical spine stimulator  08/2012   CHOLECYSTECTOMY  06/10/2015   Procedure: LAPAROSCOPIC CHOLECYSTECTOMY;  Surgeon: Arta Bruce Kinsinger, MD;  Location: [place];  Service: General;;   COLONOSCOPY  09/2013   TA x2, rpt 5 yrs ([place])   COLONOSCOPY WITH ESOPHAGOGASTRODUODENOSCOPY (EGD)  06/2018   reactive gastropathy, tubular adenoma, rpt colonoscopy 5 yrs (Mansouraty)   COLONOSCOPY WITH PROPOFOL N/A 06/06/2018   Procedure: COLONOSCOPY WITH PROPOFOL;  Surgeon: Irving Copas., MD;  Location: [place];  Service: Gastroenterology;  Laterality: N/A;   CORONARY ANGIOPLASTY WITH STENT PLACEMENT  1995, 2000, 2005   stents in RCA, LAD & Cx-OM. -Stents were patent by cardiac catheterization in November 2016   ENDOSCOPIC RETROGRADE CHOLANGIOPANCREATOGRAPHY (ERCP) WITH PROPOFOL N/A 02/09/2018   Procedure: ENDOSCOPIC RETROGRADE CHOLANGIOPANCREATOGRAPHY (ERCP) WITH  PROPOFOL;  Surgeon: Irving Copas., MD;  Location: [facility];  Service: Gastroenterology;  Laterality: N/A;   ENDOSCOPIC RETROGRADE CHOLANGIOPANCREATOGRAPHY (ERCP) WITH PROPOFOL N/A 03/30/2018   Procedure: ENDOSCOPIC RETROGRADE CHOLANGIOPANCREATOGRAPHY (ERCP) WITH PROPOFOL;  Surgeon: Rush Landmark Telford Nab., MD;  Location: [facility] ENDOSCOPY;  Service: Gastroenterology;  Laterality: N/A;   ERCP N/A 01/08/2018   Procedure: ENDOSCOPIC RETROGRADE CHOLANGIOPANCREATOGRAPHY (ERCP);  Surgeon: Jackquline Denmark, MD;  Location: [facility] ENDOSCOPY;  Service: Endoscopy;  Laterality: N/A;   ESOPHAGOGASTRODUODENOSCOPY N/A 03/30/2018   Procedure: ESOPHAGOGASTRODUODENOSCOPY (EGD);  Surgeon: Irving Copas., MD;  Location: [facility];  Service: Gastroenterology;  Laterality: N/A;   ESOPHAGOGASTRODUODENOSCOPY (EGD) WITH PROPOFOL N/A 06/06/2018   Procedure: ESOPHAGOGASTRODUODENOSCOPY (EGD) WITH PROPOFOL;  Surgeon: Rush Landmark Telford Nab., MD;  Location: [place];  Service: Gastroenterology;  Laterality: N/A;   ESOPHAGOGASTRODUODENOSCOPY (EGD) WITH PROPOFOL N/A 07/06/2018   Procedure: ESOPHAGOGASTRODUODENOSCOPY (EGD) WITH PROPOFOL;  Surgeon: Rush Landmark Telford Nab., MD;  Location: [place];  Service: Gastroenterology;  Laterality: N/A;   lumbar spine stimulator     POLYPECTOMY  06/06/2018   Procedure: POLYPECTOMY;  Surgeon: Mansouraty, Telford Nab., MD;  Location: [place];  Service: Gastroenterology;;   REMOVAL OF STONES  03/30/2018   Procedure: REMOVAL OF STONES;  Surgeon: Irving Copas., MD;  Location: [facility];  Service: Gastroenterology;;   Joan Mayans  01/08/2018   Procedure: Joan Mayans;  Surgeon: Jackquline Denmark, MD;  Location: [place];  Service: Endoscopy;;   SPHINCTEROTOMY  02/09/2018   Procedure: Joan Mayans;  Surgeon: Mansouraty, Telford Nab., MD;  Location: [facility];  Service: Gastroenterology;;   STENT REMOVAL  02/09/2018   Procedure: STENT REMOVAL;   Surgeon: Irving Copas., MD;  Location: [facility] ENDOSCOPY;  Service: Gastroenterology;;   Lavell Islam REMOVAL  03/30/2018   Procedure: STENT REMOVAL;  Surgeon: Irving Copas., MD;  Location: [facility] ENDOSCOPY;  Service: Gastroenterology;;   TRANSTHORACIC ECHOCARDIOGRAM  04/2015   EF 40-45%, mod antsept, ant, antlat, apical HK, mild AI/MR, PASP 48mmHg.   UVULOPALATOPHARYNGOPLASTY, TONSILLECTOMY AND SEPTOPLASTY  2002   Social History   Socioeconomic History   Marital status: Married    Spouse name: Not on file   Number of children: 3   Years of education: Not on file   Highest education level: Not on file  Occupational History   Occupation: retired  Scientist, product/process development strain: Not on file   Food insecurity:    Worry: Not on file    Inability: Not on file   Transportation needs:    Medical: Not on file    Non-medical: Not on file  Tobacco Use   Smoking status: Former Smoker  Types: Cigarettes    Last attempt to quit: 1972    Years since quitting: 48.2   Smokeless tobacco: Never Used   Tobacco comment: was very light  Substance and Sexual Activity   Alcohol use: Yes    Alcohol/week: 0.0 standard drinks    Comment: moderate   Drug use: No   Sexual activity: Yes  Lifestyle   Physical activity:    Days per week: Not on file    Minutes per session: Not on file   Stress: Not on file  Relationships   Social connections:    Talks on phone: Not on file    Gets together: Not on file    Attends religious service: Not on file    Active member of club or organization: Not on file    Attends meetings of clubs or organizations: Not on file    Relationship status: Not on file   Intimate partner violence:    Fear of current or ex partner: Not on file    Emotionally abused: Not on file    Physically abused: Not on file    Forced sexual activity: Not on file  Other Topics Concern   Not on file  Social History Narrative   Caffeine: 1 cup  soda, occasional coffee   Lives with wife [name]) and 47 yo son, 4 dogs   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   Activity: no regular activity   Diet: overall healthy, good fruits and vegetables, good amt water   Family History  Problem Relation Age of Onset   Diabetes Mother    Heart disease Mother    Cancer Sister 70       male cancer   Breast cancer Brother 54   Hypertension Maternal Aunt    Coronary artery disease Neg Hx    Stroke Neg Hx    Colon cancer Neg Hx    Pancreatic cancer Neg Hx    Stomach cancer Neg Hx    Esophageal cancer Neg Hx    Inflammatory bowel disease Neg Hx    Liver disease Neg Hx    Rectal cancer Neg Hx    I have reviewed his medical, social, and family history in detail and updated the electronic medical record as necessary.    PHYSICAL EXAMINATION  BP 100/70 (BP Location: Left Arm, Patient Position: Sitting, Cuff Size: Normal)    Pulse 62    Temp 98.9 F (37.2 C)    Ht 5\' 9"  (1.753 m)    Wt 222 lb 6 oz (100.9 kg) Comment: with bilateral prosthesis   BMI 32.84 kg/m  Wt Readings from Last 3 Encounters:  08/16/18 222 lb 6 oz (100.9 kg)  07/13/18 201 lb (91.2 kg)  07/06/18 220 lb 12 oz (100.1 kg)  GEN: NAD, appears stated age, not accompanied by wife today PSYCH: Cooperative, without pressured speech EYE: Conjunctivae pink, sclerae anicteric ENT: MMM CV: RR without R/Gs  RESP: No adventitious sounds present GI: NABS, soft, NT/ND, surgical scars appreciated, without rebound or guarding, no HSM appreciated MSK/EXT: Trace bilateral lower extremity edema SKIN: No jaundice NEURO:  Alert & Oriented x 3, no focal deficits   REVIEW OF DATA  I reviewed the following data at the time of this encounter:  GI Procedures and Studies  07/06/2018 EGD - No gross lesions in esophagus. - J-shaped gastric deformity. - Acquired duodenal stenosis just proximal to the ampulla. Dilated to 18 mm  sequentially. - Patent sphincterotomy was found. - Mild deformity in the angle/turns of the D2/D3 region - not clear if a result of adhesive disease. However, normal mucosa was found in the second and third portions of the Duodenum.  Laboratory Studies  Reviewed in epic  Imaging Studies  No new imaging to review   ASSESSMENT  Mr. Girgis is a 68 y.o. male with a pmh significant for CAD, HTN, pAFib, s/p CCK, hx of recent choledocholithiasis (status post ERCP with sphincterotomy and balloon sweeping), colon polyps (tubular adenoma), duodenal stricture (status post dilation).  The patient is seen today for evaluation and management of:  1. Duodenal stenosis   2. Change in bowel habits    The patient is clinically and hemodynamically well.  I think our duodenal stricture dilation has helped this patient significantly as many of his prior symptoms from an upper GI perspective have abated with this series of dilations.  I am hopeful that we will not have to redo this though will be prepared if necessary.  The patient is aware that if symptoms start to recur that he should let our office know even if it is before an upcoming clinic visit for follow-up.  In regards to his changes in bowel habits no seem to have improved as well and he is using MiraLAX as needed as well as fiber supplementation when he is getting constipated but there is no issue of diarrhea currently.  Although at risk of potentially having issues of bacterial overgrowth it does not seem that is a recurrent issue that we deal with at this time.  Due for follow-up colonoscopy in 5 years for history of colon polyps and finding of tubular adenoma earlier this year.  The patient will maintain omeprazole 40 mg twice daily through the month of March and then transition to 40 mg once daily in April.  I will see him back in approximately 2 months and determine the ability to come down to 20 mg once daily.  All patient questions were answered, to  the best of my ability, and the patient agrees to the aforementioned plan of action with follow-up as indicated.   PLAN  Omeprazole 40 mg twice daily through March then transition to once daily If patient has recurrent symptoms he will let us know to consider the role of repeat endoscopy with dilation of duodenal stenosis/stricture Hold on SIBO evaluation as well as EPI evaluation Follow-up in 2 to 3 months to try and further down titrate PPI if able Colonoscopy in 2025 for surveillance   No orders of the defined types were placed in this encounter.   New Prescriptions   OMEPRAZOLE (PRILOSEC) 40 MG CAPSULE    Take 1 capsule (40 mg total) by mouth daily. Start once daily omeprazole in April 2020   Modified Medications   No medications on file    Planned Follow Up: Return in about 3 months (around 11/16/2018).   Justice Britain, MD [facility] Office # [phone]

## 2018-08-16 NOTE — Patient Instructions (Addendum)
If you are age 69 or older, your body mass index should be between 23-30. Your Body mass index is 32.84 kg/m. If this is out of the aforementioned range listed, please consider follow up with your Primary Care Provider.  If you are age 93 or younger, your body mass index should be between 19-25. Your Body mass index is 32.84 kg/m. If this is out of the aformentioned range listed, please consider follow up with your Primary Care Provider.     In April 2020, start Omeprazole 40mg  once daily.   We will see you back in 3-4 months for follow-up appointment.   Thank you for choosing me and [facility].  Dr. Rush Landmark

## 2018-08-18 ENCOUNTER — Encounter: Payer: Self-pay | Admitting: Gastroenterology

## 2018-08-18 DIAGNOSIS — R194 Change in bowel habit: Secondary | ICD-10-CM | POA: Insufficient documentation

## 2018-10-09 ENCOUNTER — Other Ambulatory Visit: Payer: Self-pay | Admitting: Family Medicine

## 2018-10-21 ENCOUNTER — Telehealth: Payer: Self-pay | Admitting: *Deleted

## 2018-10-21 DIAGNOSIS — I255 Ischemic cardiomyopathy: Secondary | ICD-10-CM

## 2018-10-21 DIAGNOSIS — Z9861 Coronary angioplasty status: Secondary | ICD-10-CM

## 2018-10-21 DIAGNOSIS — E785 Hyperlipidemia, unspecified: Secondary | ICD-10-CM

## 2018-10-21 DIAGNOSIS — I2510 Atherosclerotic heart disease of native coronary artery without angina pectoris: Secondary | ICD-10-CM

## 2018-10-21 NOTE — Telephone Encounter (Signed)
Mailed letter and labslip 

## 2018-10-21 NOTE — Telephone Encounter (Signed)
-----   Message from Raiford Simmonds, RN sent at 07/14/2018  3:32 PM EST ----- Will need labs lipid , hepatic @ 3 months May  2020 -  will need to place orders for labs  HEPATIC , and lipids and mail to patient @  first patient of may 2020.

## 2018-11-21 DIAGNOSIS — I255 Ischemic cardiomyopathy: Secondary | ICD-10-CM | POA: Diagnosis not present

## 2018-11-21 DIAGNOSIS — E785 Hyperlipidemia, unspecified: Secondary | ICD-10-CM | POA: Diagnosis not present

## 2018-11-21 DIAGNOSIS — Z9861 Coronary angioplasty status: Secondary | ICD-10-CM | POA: Diagnosis not present

## 2018-11-21 DIAGNOSIS — I2510 Atherosclerotic heart disease of native coronary artery without angina pectoris: Secondary | ICD-10-CM | POA: Diagnosis not present

## 2018-11-22 LAB — HEPATIC FUNCTION PANEL
ALT: 32 IU/L (ref 0–44)
AST: 22 IU/L (ref 0–40)
Albumin: 4.3 g/dL (ref 3.8–4.8)
Alkaline Phosphatase: 72 IU/L (ref 39–117)
Bilirubin Total: 0.4 mg/dL (ref 0.0–1.2)
Bilirubin, Direct: 0.15 mg/dL (ref 0.00–0.40)
Total Protein: 6.9 g/dL (ref 6.0–8.5)

## 2018-11-22 LAB — LIPID PANEL
Chol/HDL Ratio: 2.3 ratio (ref 0.0–5.0)
Cholesterol, Total: 113 mg/dL (ref 100–199)
HDL: 50 mg/dL (ref >39)
LDL Calculated: 49 mg/dL (ref 0–99)
Triglycerides: 69 mg/dL (ref 0–149)
VLDL Cholesterol Cal: 14 mg/dL (ref 5–40)

## 2018-11-23 ENCOUNTER — Other Ambulatory Visit: Payer: Self-pay | Admitting: Family Medicine

## 2018-11-23 DIAGNOSIS — R7303 Prediabetes: Secondary | ICD-10-CM

## 2018-11-23 DIAGNOSIS — E785 Hyperlipidemia, unspecified: Secondary | ICD-10-CM

## 2018-11-23 DIAGNOSIS — Z125 Encounter for screening for malignant neoplasm of prostate: Secondary | ICD-10-CM

## 2018-11-23 DIAGNOSIS — E559 Vitamin D deficiency, unspecified: Secondary | ICD-10-CM

## 2018-11-24 ENCOUNTER — Ambulatory Visit (INDEPENDENT_AMBULATORY_CARE_PROVIDER_SITE_OTHER): Payer: Medicare Other

## 2018-11-24 ENCOUNTER — Other Ambulatory Visit (INDEPENDENT_AMBULATORY_CARE_PROVIDER_SITE_OTHER): Payer: Medicare Other

## 2018-11-24 DIAGNOSIS — E785 Hyperlipidemia, unspecified: Secondary | ICD-10-CM | POA: Diagnosis not present

## 2018-11-24 DIAGNOSIS — Z125 Encounter for screening for malignant neoplasm of prostate: Secondary | ICD-10-CM

## 2018-11-24 DIAGNOSIS — Z0000 Encounter for general adult medical examination without abnormal findings: Secondary | ICD-10-CM

## 2018-11-24 DIAGNOSIS — E559 Vitamin D deficiency, unspecified: Secondary | ICD-10-CM

## 2018-11-24 DIAGNOSIS — R7303 Prediabetes: Secondary | ICD-10-CM

## 2018-11-24 LAB — HEMOGLOBIN A1C: Hgb A1c MFr Bld: 5.8 % (ref 4.6–6.5)

## 2018-11-24 LAB — PSA: PSA: 0.94 ng/mL (ref 0.10–4.00)

## 2018-11-24 LAB — BASIC METABOLIC PANEL
BUN: 20 mg/dL (ref 6–23)
CO2: 28 mEq/L (ref 19–32)
Calcium: 8.9 mg/dL (ref 8.4–10.5)
Chloride: 106 mEq/L (ref 96–112)
Creatinine, Ser: 0.64 mg/dL (ref 0.40–1.50)
GFR: 124.1 mL/min (ref >60.00)
Glucose, Bld: 108 mg/dL — ABNORMAL HIGH (ref 70–99)
Potassium: 4.3 mEq/L (ref 3.5–5.1)
Sodium: 142 mEq/L (ref 135–145)

## 2018-11-24 LAB — VITAMIN D 25 HYDROXY (VIT D DEFICIENCY, FRACTURES): VITD: 44.98 ng/mL (ref 30.00–100.00)

## 2018-11-24 NOTE — Progress Notes (Signed)
PCP notes:   Health maintenance:  No gaps identified  Abnormal screenings:   None  Patient concerns:   None  Nurse concerns:  None  Next PCP appt:   11/29/18 @ 0900

## 2018-11-24 NOTE — Patient Instructions (Signed)
Mr. Heart , Thank you for taking time to come for your Medicare Wellness Visit. I appreciate your ongoing commitment to your health goals. Please review the following plan we discussed and let me know if I can assist you in the future.   These are the goals we discussed: Goals    . Increase physical activity     Starting 11/24/2018, I will continue to exercise for at least 30 minutes daily in an effort to reach target weight of 200 lbs.        This is a list of the screening recommended for you and due dates:  Health Maintenance  Topic Date Due  . DTaP/Tdap/Td vaccine (1 - Tdap) 05/03/2022*  . Flu Shot  12/31/2018  . Tetanus Vaccine  05/03/2022  . Colon Cancer Screening  06/07/2023  .  Hepatitis C: One time screening is recommended by Center for Disease Control  (CDC) for  adults born from [birth year] through 1966.   Completed  . Pneumonia vaccines  Completed  *Topic was postponed. The date shown is not the original due date.   Preventive Care for Adults  A healthy lifestyle and preventive care can promote health and wellness. Preventive health guidelines for adults include the following key practices.  . A routine yearly physical is a good way to check with your health care provider about your health and preventive screening. It is a chance to share any concerns and updates on your health and to receive a thorough exam.  . Visit your dentist for a routine exam and preventive care every 6 months. Brush your teeth twice a day and floss once a day. Good oral hygiene prevents tooth decay and gum disease.  . The frequency of eye exams is based on your age, health, family medical history, use  of contact lenses, and other factors. Follow your health care provider's recommendations for frequency of eye exams.  . Eat a healthy diet. Foods like vegetables, fruits, whole grains, low-fat dairy products, and lean protein foods contain the nutrients you need without too many calories. Decrease your  intake of foods high in solid fats, added sugars, and salt. Eat the right amount of calories for you. Get information about a proper diet from your health care provider, if necessary.  . Regular physical exercise is one of the most important things you can do for your health. Most adults should get at least 150 minutes of moderate-intensity exercise (any activity that increases your heart rate and causes you to sweat) each week. In addition, most adults need muscle-strengthening exercises on 2 or more days a week.  Silver Sneakers may be a benefit available to you. To determine eligibility, you may visit the website: www.silversneakers.com or contact program at 936 750 3390 Mon-Fri between 8AM-8PM.   . Maintain a healthy weight. The body mass index (BMI) is a screening tool to identify possible weight problems. It provides an estimate of body fat based on height and weight. Your health care provider can find your BMI and can help you achieve or maintain a healthy weight.   For adults 20 years and older: ? A BMI below 18.5 is considered underweight. ? A BMI of 18.5 to 24.9 is normal. ? A BMI of 25 to 29.9 is considered overweight. ? A BMI of 30 and above is considered obese.   . Maintain normal blood lipids and cholesterol levels by exercising and minimizing your intake of saturated fat. Eat a balanced diet with plenty of fruit and vegetables. Blood  tests for lipids and cholesterol should begin at age 51 and be repeated every 5 years. If your lipid or cholesterol levels are high, you are over 50, or you are at high risk for heart disease, you may need your cholesterol levels checked more frequently. Ongoing high lipid and cholesterol levels should be treated with medicines if diet and exercise are not working.  . If you smoke, find out from your health care provider how to quit. If you do not use tobacco, please do not start.  . If you choose to drink alcohol, please do not consume more than 2  drinks per day. One drink is considered to be 12 ounces (355 mL) of beer, 5 ounces (148 mL) of wine, or 1.5 ounces (44 mL) of liquor.  . If you are 49-67 years old, ask your health care provider if you should take aspirin to prevent strokes.  . Use sunscreen. Apply sunscreen liberally and repeatedly throughout the day. You should seek shade when your shadow is shorter than you. Protect yourself by wearing long sleeves, pants, a wide-brimmed hat, and sunglasses year round, whenever you are outdoors.  . Once a month, do a whole body skin exam, using a mirror to look at the skin on your back. Tell your health care provider of new moles, moles that have irregular borders, moles that are larger than a pencil eraser, or moles that have changed in shape or color.

## 2018-11-24 NOTE — Progress Notes (Signed)
Subjective:   Alexander [name] is a 68 y.o. male who presents for Medicare Annual/Subsequent preventive examination.  Review of Systems:  N/A Cardiac Risk Factors include: advanced age (>10men, >22 women);dyslipidemia;hypertension;male gender;obesity (BMI >30kg/m2)     Objective:    Vitals: There were no vitals taken for this visit.  There is no height or weight on file to calculate BMI.  Advanced Directives 11/24/2018 07/06/2018 06/06/2018 03/30/2018 03/28/2018 02/09/2018 01/07/2018  Does Patient Have a Medical Advance Directive? No No No No No No No  Type of Advance Directive - - - - - - -  Would patient like information on creating a medical advance directive? No - Patient declined No - Patient declined No - Patient declined - - No - Patient declined Yes (Inpatient - patient requests chaplain consult to create a medical advance directive)    Tobacco Social History   Tobacco Use  Smoking Status Former Smoker  . Types: Cigarettes  . Quit date: 92  . Years since quitting: 48.5  Smokeless Tobacco Never Used  Tobacco Comment   was very light     Counseling given: No Comment: was very light   Clinical Intake:  Pre-visit preparation completed: Yes  Pain : No/denies pain Pain Score: 0-No pain     Nutritional Status: BMI > 30  Obese Nutritional Risks: None  How often do you need to have someone help you when you read instructions, pamphlets, or other written materials from your doctor or pharmacy?: 1 - Never What is the last grade level you completed in school?: 12th grade  Interpreter Needed?: No  Comments: pt lives with spouse Information entered by :: LPinson, RN  Past Medical History:  Diagnosis Date  . Acute cholecystitis 03/2016   with sepsis  . Arthritis   . CAD S/P percutaneous coronary angioplasty 1995, 2000, 2005   a. 1995 s/p BMS;  b. 2000 s/p BMS;  c. 2005 s/p stent - All stents in [facility] (RCA, LAD & OM - unknown on which date);  d. 04/2015  NSTEMI/Cath: LM nl, ost/p LAD 20%, patent mLAD stent, RI small, OM2 patent stent, pRCA 20%, patent stent, EF 45-50%-->Med Rx.  . Calculus of bile duct with acute cholangitis with obstruction   . Chronic pain syndrome    a. Followed @ [facility];  b. Uses 12-14 excedrins per day.  . Duodenal stricture   . Essential hypertension   . Fatty liver   . Gallstones   . GERD (gastroesophageal reflux disease)   . History of depression   . History of post poliomyelitis muscular atrophy   . HLD (hyperlipidemia)   . Ischemic cardiomyopathy    a. 04/2015 Echo: EF 40-45%, mod antsept, ant, antlat, apical HK, mild AI/MR, PASP 67mmHg.  . Migraines   . 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 NSTEMI (non-ST elevated myocardial infarction) ([place]) 04/2015   - Demand Ischemia in setting of Sepsis (also with prior MI history  . OSA (obstructive sleep apnea)    does not want to use CPAP, using mouth guard  . PAF (paroxysmal atrial fibrillation) ([place])    a. 04/2015 in setting of cholecystits and sepsis -->Amio;  b. CHA2DS2VASc = 4--> Eliquis.  . Pneumonia 04/2015  . Post-polio syndrome    a. ambulates with braces.  . Shoulder pain, right    Past Surgical History:  Procedure Laterality Date  . BALLOON DILATION N/A 06/06/2018   Procedure: BALLOON DILATION;  Surgeon: Justice Britain  Brooke Bonito., MD;  Location: [place];  Service: Gastroenterology;  Laterality: N/A;  . BALLOON DILATION N/A 07/06/2018   Procedure: BALLOON DILATION;  Surgeon: Irving Copas., MD;  Location: [place];  Service: Gastroenterology;  Laterality: N/A;  . BILIARY STENT PLACEMENT  01/08/2018   Procedure: BILIARY STENT PLACEMENT;  Surgeon: Jackquline Denmark, MD;  Location: [facility] ENDOSCOPY;  Service: Endoscopy;;  . BILIARY STENT PLACEMENT N/A 02/09/2018   Procedure: BILIARY STENT PLACEMENT;  Surgeon: Irving Copas., MD;  Location: [facility];  Service: Gastroenterology;  Laterality: N/A;  . BIOPSY  06/06/2018    Procedure: BIOPSY;  Surgeon: Rush Landmark Telford Nab., MD;  Location: [facility] ENDOSCOPY;  Service: Gastroenterology;;  . CARDIAC CATHETERIZATION N/A 04/30/2015   Procedure: Left Heart Cath and Coronary Angiography;  Surgeon: Troy Sine, MD;  Location: [place] CV LAB;  Service: Cardiovascular; LM nl, ost/p LAD 20%, patent mLAD stent, RI small, OM2 patent stent, pRCA 20%, patent stent, EF 45-50%-->Med Rx   . cervical spine stimulator  08/2012  . CHOLECYSTECTOMY  06/10/2015   Procedure: LAPAROSCOPIC CHOLECYSTECTOMY;  Surgeon: Mickeal Skinner, MD;  Location: [place];  Service: General;;  . COLONOSCOPY  09/2013   TA x2, rpt 5 yrs ([place])  . COLONOSCOPY WITH ESOPHAGOGASTRODUODENOSCOPY (EGD)  06/2018   reactive gastropathy, tubular adenoma, rpt colonoscopy 5 yrs (Mansouraty)  . COLONOSCOPY WITH PROPOFOL N/A 06/06/2018   Procedure: COLONOSCOPY WITH PROPOFOL;  Surgeon: Rush Landmark Telford Nab., MD;  Location: [place];  Service: Gastroenterology;  Laterality: N/A;  . CORONARY ANGIOPLASTY WITH STENT PLACEMENT  1995, 2000, 2005   stents in RCA, LAD & Cx-OM. -Stents were patent by cardiac catheterization in November 2016  . ENDOSCOPIC RETROGRADE CHOLANGIOPANCREATOGRAPHY (ERCP) WITH PROPOFOL N/A 02/09/2018   Procedure: ENDOSCOPIC RETROGRADE CHOLANGIOPANCREATOGRAPHY (ERCP) WITH PROPOFOL;  Surgeon: Rush Landmark Telford Nab., MD;  Location: [facility] ENDOSCOPY;  Service: Gastroenterology;  Laterality: N/A;  . ENDOSCOPIC RETROGRADE CHOLANGIOPANCREATOGRAPHY (ERCP) WITH PROPOFOL N/A 03/30/2018   Procedure: ENDOSCOPIC RETROGRADE CHOLANGIOPANCREATOGRAPHY (ERCP) WITH PROPOFOL;  Surgeon: Rush Landmark Telford Nab., MD;  Location: [facility] ENDOSCOPY;  Service: Gastroenterology;  Laterality: N/A;  . ERCP N/A 01/08/2018   Procedure: ENDOSCOPIC RETROGRADE CHOLANGIOPANCREATOGRAPHY (ERCP);  Surgeon: Jackquline Denmark, MD;  Location: [facility] ENDOSCOPY;  Service: Endoscopy;  Laterality: N/A;  . ESOPHAGOGASTRODUODENOSCOPY N/A 03/30/2018   Procedure:  ESOPHAGOGASTRODUODENOSCOPY (EGD);  Surgeon: Irving Copas., MD;  Location: [facility];  Service: Gastroenterology;  Laterality: N/A;  . ESOPHAGOGASTRODUODENOSCOPY (EGD) WITH PROPOFOL N/A 06/06/2018   Procedure: ESOPHAGOGASTRODUODENOSCOPY (EGD) WITH PROPOFOL;  Surgeon: Rush Landmark Telford Nab., MD;  Location: [place];  Service: Gastroenterology;  Laterality: N/A;  . ESOPHAGOGASTRODUODENOSCOPY (EGD) WITH PROPOFOL N/A 07/06/2018   Procedure: ESOPHAGOGASTRODUODENOSCOPY (EGD) WITH PROPOFOL;  Surgeon: Rush Landmark Telford Nab., MD;  Location: [place];  Service: Gastroenterology;  Laterality: N/A;  . lumbar spine stimulator    . POLYPECTOMY  06/06/2018   Procedure: POLYPECTOMY;  Surgeon: Mansouraty, Telford Nab., MD;  Location: [place];  Service: Gastroenterology;;  . REMOVAL OF STONES  03/30/2018   Procedure: REMOVAL OF STONES;  Surgeon: Irving Copas., MD;  Location: [facility];  Service: Gastroenterology;;  . Joan Mayans  01/08/2018   Procedure: Joan Mayans;  Surgeon: Jackquline Denmark, MD;  Location: [place];  Service: Endoscopy;;  . Joan Mayans  02/09/2018   Procedure: Joan Mayans;  Surgeon: Mansouraty, Telford Nab., MD;  Location: [facility];  Service: Gastroenterology;;  . Lavell Islam REMOVAL  02/09/2018   Procedure: STENT REMOVAL;  Surgeon: Irving Copas., MD;  Location: [facility];  Service: Gastroenterology;;  . Lavell Islam REMOVAL  03/30/2018  Procedure: STENT REMOVAL;  Surgeon: Irving Copas., MD;  Location: [facility] ENDOSCOPY;  Service: Gastroenterology;;  . TRANSTHORACIC ECHOCARDIOGRAM  04/2015   EF 40-45%, mod antsept, ant, antlat, apical HK, mild AI/MR, PASP 24mmHg.  Marland Kitchen UVULOPALATOPHARYNGOPLASTY, TONSILLECTOMY AND SEPTOPLASTY  2002   Family History  Problem Relation Age of Onset  . Diabetes Mother   . Heart disease Mother   . Cancer Sister 32       male cancer  . Breast cancer Brother 36  . Hypertension Maternal Aunt   . Coronary artery  disease Neg Hx   . Stroke Neg Hx   . Colon cancer Neg Hx   . Pancreatic cancer Neg Hx   . Stomach cancer Neg Hx   . Esophageal cancer Neg Hx   . Inflammatory bowel disease Neg Hx   . Liver disease Neg Hx   . Rectal cancer Neg Hx    Social History   Socioeconomic History  . Marital status: Married    Spouse name: Not on file  . Number of children: 3  . Years of education: Not on file  . Highest education level: Not on file  Occupational History  . Occupation: retired  Scientific laboratory technician  . Financial resource strain: Not on file  . Food insecurity    Worry: Not on file    Inability: Not on file  . Transportation needs    Medical: Not on file    Non-medical: Not on file  Tobacco Use  . Smoking status: Former Smoker    Types: Cigarettes    Quit date: 1972    Years since quitting: 48.5  . Smokeless tobacco: Never Used  . Tobacco comment: was very light  Substance and Sexual Activity  . Alcohol use: Yes    Alcohol/week: 0.0 standard drinks    Comment: moderate  . Drug use: No  . Sexual activity: Yes  Lifestyle  . Physical activity    Days per week: Not on file    Minutes per session: Not on file  . Stress: Not on file  Relationships  . Social Herbalist on phone: Not on file    Gets together: Not on file    Attends religious service: Not on file    Active member of club or organization: Not on file    Attends meetings of clubs or organizations: Not on file    Relationship status: Not on file  Other Topics Concern  . 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 acetaminophen (TYLENOL) 500 MG tablet Take 1,000 mg  by mouth every 6 (six) hours as needed for moderate pain or headache.   . Ascorbic Acid (VITAMIN C) 1000 MG tablet Take 1,000 mg by mouth daily.  Marland Kitchen atorvastatin (LIPITOR) 80 MG tablet Take 1 tablet (80 mg total) by mouth daily at 6 PM.  . Carboxymethylcellulose Sodium (LUBRICANT EYE DROPS OP) Place 2 drops into both eyes daily as needed (for dry eyes).  . Cholecalciferol (VITAMIN D3) 50 MCG (2000 UT) TABS Take 2,000 Units by mouth daily.  Marland Kitchen gabapentin (NEURONTIN) 600 MG tablet  TAKE 2 TABLETS 3 TIMES A DAY  . lisinopril (PRINIVIL,ZESTRIL) 2.5 MG tablet Take 1 tablet (2.5 mg total) by mouth daily.  . Magnesium 250 MG TABS Take 1 tablet (250 mg total) by mouth at bedtime. (Patient taking differently: Take 250 mg by mouth at bedtime. )  . Multiple Vitamin (MULTIVITAMIN WITH MINERALS) TABS tablet Take 1 tablet by mouth daily.  . Omega-3 Fatty Acids (FISH OIL) 1000 MG CAPS Take 1,000 mg by mouth daily.   Marland Kitchen omeprazole (PRILOSEC) 40 MG capsule Take 1 capsule (40 mg total) by mouth daily. Start once daily omeprazole in April 2020  . omeprazole (PRILOSEC) 40 MG capsule TAKE 1 CAPSULE BY MOUTH TWO TIMES DAILY  . sildenafil (VIAGRA) 100 MG tablet TAKE ONE-HALF TO ONE TABLET BY MOUTH DAILY AS NEEDED FOR ERECTILE DYSFUNCTION (Patient taking differently: Take 50-100 mg by mouth daily as needed for erectile dysfunction. )  . vitamin B-12 (CYANOCOBALAMIN) 1000 MCG tablet Take 1,000 mcg by mouth daily.  . vitamin E 400 UNIT capsule Take 400 Units by mouth daily.   No facility-administered encounter medications on file as of 11/24/2018.     Activities of Daily Living In your present state of health, do you have any difficulty performing the following activities: 11/24/2018 01/07/2018  Hearing? N N  Vision? N N  Difficulty concentrating or making decisions? N N  Walking or climbing stairs? N N  Dressing or bathing? N N  Doing errands, shopping? N N  Preparing Food and eating ? N -  Using the Toilet? N -  In the  past six months, have you accidently leaked urine? N -  Do you have problems with loss of bowel control? N -  Managing your Medications? N -  Managing your Finances? N -  Housekeeping or managing your Housekeeping? N -  Some recent data might be hidden    Patient Care Team: [name], MD as PCP - General (Family Medicine) Leonie Man, MD as PCP - Cardiology (Cardiology) Leonie Man, MD as Consulting Physician (Cardiology) Wilhelmina Mcardle, MD as Consulting Physician (Pulmonary Disease)   Assessment:   This is a routine wellness examination for Ercole.   Hearing Screening   125Hz  250Hz  500Hz  1000Hz  2000Hz  3000Hz  4000Hz  6000Hz  8000Hz   Right ear:           Left ear:           Vision Screening Comments: Vision exam in 2019 @ [facility] and Dietary recommendations Current Exercise Habits: Home exercise routine, Type of exercise: strength training/weights, Time (Minutes): 30, Frequency (Times/Week): 7, Weekly Exercise (Minutes/Week): 210, Intensity: Moderate, Exercise limited by: orthopedic condition(s)  Goals    . Increase physical activity     Starting 11/24/2018, I will continue to exercise for at least 30 minutes daily in an effort to reach target weight of 200 lbs.        Fall Risk Fall Risk  11/24/2018 11/04/2017 12/21/2016 10/30/2016 10/29/2015  Falls in the past year? 0 Yes No Yes Yes  Comment - - Emmi Telephone Survey: data to providers prior to load due to pt's health status, he has multiple falls pt often falls in yard while doing yardwork  Number falls in past yr: - 1 - 2 or more 2 or more  Injury with Fall? - Yes - No No  Risk Factor Category  - - - High Fall Risk High Fall Risk  Risk for fall due to : - - -  History of fall(s);Impaired balance/gait;Impaired mobility -  Risk for fall due to: Comment - - - - -  Follow up - - - - Falls evaluation completed   Depression Screen PHQ 2/9 Scores 11/24/2018 11/04/2017 10/30/2016 10/29/2015  PHQ  - 2 Score 0 0 0 0  PHQ- 9 Score 0 5 - -    Cognitive Function MMSE - Mini Mental State Exam 11/24/2018 11/04/2017 10/30/2016 10/29/2015  Orientation to time 5 5 5 5   Orientation to Place 5 5 5 5   Registration 3 3 3 3   Attention/ Calculation 0 0 0 0  Recall 3 2 3 3   Recall-comments - unable to recall 1 of 3 words - -  Language- name 2 objects 0 0 0 0  Language- repeat 1 1 1 1   Language- follow 3 step command 0 3 3 3   Language- read & follow direction 0 0 0 0  Write a sentence 0 0 0 0  Copy design 0 0 0 0  Total score 17 19 20 20      PLEASE NOTE: A Mini-Cog screen was completed. Maximum score is 17. A value of 0 denotes this part of Folstein MMSE was not completed or the patient failed this part of the Mini-Cog screening.   Mini-Cog Screening Orientation to Time - Max 5 pts Orientation to Place - Max 5 pts Registration - Max 3 pts Recall - Max 3 pts Language Repeat - Max 1 pts      Immunization History  Administered Date(s) Administered  . Influenza, High Dose Seasonal PF 03/04/2016, 02/21/2018  . Influenza, Seasonal, Injecte, Preservative Fre 05/03/2012, 05/05/2014, 02/28/2015  . Influenza,inj,Quad PF,6+ Mos 02/27/2013  . Influenza-Unspecified 03/17/2017  . Pneumococcal Conjugate-13 04/28/2015  . Pneumococcal Polysaccharide-23 10/30/2016  . Td 05/03/2012  . Zoster 06/29/2013    Screening Tests Health Maintenance  Topic Date Due  . DTaP/Tdap/Td (1 - Tdap) 05/03/2022 (Originally 03/03/1970)  . INFLUENZA VACCINE  12/31/2018  . TETANUS/TDAP  05/03/2022  . COLONOSCOPY  06/07/2023  . Hepatitis C Screening  Completed  . PNA vac Low Risk Adult  Completed       Plan:     I have personally reviewed, addressed, and noted the following in the patient's chart:  A. Medical and social history B. Use of alcohol, tobacco or illicit drugs  C. Current medications and supplements D. Functional ability and status E.  Nutritional status F.  Physical activity G. Advance directives  H. List of other physicians I.  Hospitalizations, surgeries, and ER visits in previous 12 months J.  Vitals (unless it is a telemedicine encounter) K. Screenings to include cognitive, depression, hearing, vision (NOTE: hearing and vision screenings not completed in telemedicine encounter) L. Referrals and appointments   In addition, I have reviewed and discussed with patient certain preventive protocols, quality metrics, and best practice recommendations. A written personalized care plan for preventive services and recommendations were provided to patient.  With patient's permission, we connected on 11/24/18 at  9:00 AM EDT. Interactive audio and video telecommunications were attempted with patient. This attempt was unsuccessful due to patient having technical difficulties OR patient did not have access to video capability.  Encounter was completed with audio only.  Two patient identifiers were used to ensure the encounter occurred with the correct person. Patient was in home and writer was in office.     Signed,   Lindell Noe, MHA, BS, RN Health Coach

## 2018-11-28 NOTE — Progress Notes (Signed)
I reviewed health advisor's note, was available for consultation, and agree with documentation and plan.  

## 2018-11-29 ENCOUNTER — Other Ambulatory Visit: Payer: Self-pay

## 2018-11-29 ENCOUNTER — Encounter: Payer: Self-pay | Admitting: Family Medicine

## 2018-11-29 ENCOUNTER — Encounter: Payer: Medicare Other | Admitting: Family Medicine

## 2018-11-29 ENCOUNTER — Ambulatory Visit (INDEPENDENT_AMBULATORY_CARE_PROVIDER_SITE_OTHER): Payer: Medicare Other | Admitting: Family Medicine

## 2018-11-29 VITALS — BP 128/77 | HR 69 | Temp 97.0°F | Ht 70.0 in | Wt 203.0 lb

## 2018-11-29 DIAGNOSIS — Z7189 Other specified counseling: Secondary | ICD-10-CM | POA: Diagnosis not present

## 2018-11-29 DIAGNOSIS — Z0001 Encounter for general adult medical examination with abnormal findings: Secondary | ICD-10-CM | POA: Insufficient documentation

## 2018-11-29 DIAGNOSIS — I10 Essential (primary) hypertension: Secondary | ICD-10-CM

## 2018-11-29 DIAGNOSIS — K315 Obstruction of duodenum: Secondary | ICD-10-CM

## 2018-11-29 DIAGNOSIS — Z0000 Encounter for general adult medical examination without abnormal findings: Secondary | ICD-10-CM | POA: Insufficient documentation

## 2018-11-29 DIAGNOSIS — E559 Vitamin D deficiency, unspecified: Secondary | ICD-10-CM

## 2018-11-29 DIAGNOSIS — E785 Hyperlipidemia, unspecified: Secondary | ICD-10-CM

## 2018-11-29 DIAGNOSIS — R7303 Prediabetes: Secondary | ICD-10-CM

## 2018-11-29 DIAGNOSIS — G14 Postpolio syndrome: Secondary | ICD-10-CM

## 2018-11-29 DIAGNOSIS — K219 Gastro-esophageal reflux disease without esophagitis: Secondary | ICD-10-CM

## 2018-11-29 NOTE — Assessment & Plan Note (Signed)
Chronic, stable. Continue current regimen. 

## 2018-11-29 NOTE — Assessment & Plan Note (Signed)
Encouraged limiting added sugars in diet.  

## 2018-11-29 NOTE — Assessment & Plan Note (Signed)
Preventative protocols reviewed and updated unless pt declined. Discussed healthy diet and lifestyle.  

## 2018-11-29 NOTE — Assessment & Plan Note (Signed)
Discussed PPI dosing - should be taking omeprazole 40mg  once daily.

## 2018-11-29 NOTE — Assessment & Plan Note (Signed)
Advanced directives: wife working on setting this up at home. Wife would be HCPOA. Does not want prolonged life support.Wants to be cremated.

## 2018-11-29 NOTE — Assessment & Plan Note (Signed)
Chronic, stable. Continued fall risk. Uses braces regularly.

## 2018-11-29 NOTE — Assessment & Plan Note (Signed)
Continue replacement 

## 2018-11-29 NOTE — Progress Notes (Signed)
Alexander [name] - 68 y.o. male  MRN [id number]  Date of Birth: Jun 06, 1950  PCP: [name], MD  This service was provided via telemedicine.   Virtual visit completed through [employer]. Due to national recommendations of social distancing due to COVID-19, a virtual visit is felt to be most appropriate for this patient at this time. Reviewed limitations of a virtual visit.   Location of patient: home Location of provider: in office, [place] @ [facility] Name of referring provider: N/A   Names of persons and role in encounter: Provider: [name], MD  Patient: Alexander [name]  Other: wife also present on call   Subjective: Chief Complaint  Patient presents with  . Annual Exam    Pt 2.      HPI:  Alexander [name] last week for medicare wellness visit. Note reviewed.   Complicated recent history - duodenal stricture dilated with EGD in setting of recurrent bile duct stones (Alexander [name]).   Noticing some short term memory trouble. At times forgets   Preventative: COLONOSCOPY WITH ESOPHAGOGASTRODUODENOSCOPY (EGD) 06/2018 - reactive gastropathy, tubular adenoma, rpt colonoscopy 5 yrs (Alexander [name]) Prostate cancer screening - desires continued screening. Nocturia x2-3.  Flu shotyearly Td 2013 Prevnar 2016, pnuemovax 2018. zostavax - 06/2013 shingrix - discussed.to check with pharmacy.  Advanced directives: wife working on setting this up at home. Wife would be HCPOA. Does not want prolonged life support.Wants to be cremated. Seat [name] use discussed Sunscreen use discussed. No changing moles on skin.  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  HENT: Negative for congestion.   Eyes: Negative for visual disturbance.  Respiratory: Positive for cough (am cough, chronic) and shortness of breath. Negative for chest tightness and wheezing.   Cardiovascular: Negative for chest pain, palpitations and leg swelling.  Gastrointestinal: Negative for abdominal pain, blood in stool, constipation, diarrhea, nausea and vomiting.  Endocrine: Positive for cold intolerance.  Genitourinary: Negative for hematuria.  Musculoskeletal: Positive for arthralgias (chronic).  Neurological: Negative for dizziness, syncope and headaches.  Hematological: Negative for adenopathy. Does not bruise/bleed easily.  Psychiatric/Behavioral: Negative for dysphoric mood. The patient is nervous/anxious (occasional).      Objective/Observations:  No physical exam or vital signs collected unless specifically identified below.   BP 128/77   Pulse 69   Temp (!) 97 F (36.1 C) (Oral)   Ht 5\' 10"  (1.778 m)   Wt 203 lb (92.1 kg) Comment: Without leg braces  BMI 29.13 kg/m   Wt Readings from Last 3 Encounters:  11/29/18 203 lb (92.1 kg)  08/16/18 222 lb 6 oz (100.9 kg)  07/13/18 201 lb (91.2 kg)    Respiratory status: speaks in complete sentences without evident shortness of breath.   Assessment/Plan:  Hyperlipidemia with target LDL less than 70 Marked improvement on atorvastatin 80mg  daily. Continue. The ASCVD Risk score Mikey Bussing DC Jr., et al., 2013) failed to calculate for the following reasons:   The patient has a prior MI or stroke diagnosis   Health maintenance examination Preventative protocols reviewed and updated unless pt declined.  Discussed healthy diet and lifestyle.   Advanced care planning/counseling discussion Advanced directives: wife working on setting this up at home. Wife would be HCPOA. Does not want  prolonged life support.Wants to be cremated.  Essential hypertension Chronic, stable. Continue current regimen.   Post-polio syndrome Chronic, stable. Continued fall risk. Uses braces regularly.   Prediabetes Encouraged limiting added sugars in diet.   Vitamin D deficiency Continue replacement.   GERD (gastroesophageal reflux disease) Discussed PPI dosing - should be taking omeprazole 40mg  once daily.   Duodenal stenosis Discussed PPI dosing - should be taking omeprazole 40mg  once daily.    I discussed the assessment and treatment plan with the patient. The patient was provided an opportunity to ask questions and all were answered. The patient agreed with the plan and demonstrated an understanding of the instructions.  Lab Orders  No laboratory test(s) ordered today    No orders of the defined types were placed in this encounter.   The patient was advised to call back or seek an in-person evaluation if the symptoms worsen or if the condition fails to improve as anticipated.  [name], MD

## 2018-11-29 NOTE — Assessment & Plan Note (Signed)
Marked improvement on atorvastatin 80mg  daily. Continue. The ASCVD Risk score Mikey Bussing DC Jr., et al., 2013) failed to calculate for the following reasons:   The patient has a prior MI or stroke diagnosis

## 2019-01-05 ENCOUNTER — Telehealth: Payer: Self-pay | Admitting: Gastroenterology

## 2019-01-05 DIAGNOSIS — K315 Obstruction of duodenum: Secondary | ICD-10-CM

## 2019-01-05 NOTE — Telephone Encounter (Signed)
I spoke with the pt and his wife and advised him to come in tomorrow for labs and xray.  He was also advised to begin a liquid diet as well as soft/pureed foods.  He is aware to go to the ED if he develops worse symptoms or fever.

## 2019-01-05 NOTE — Telephone Encounter (Signed)
The pt's wife is calling to report the pt is lethargic, has abd pain under the rib cage in the center, loss of appetite.  Had similar symptoms in the past with duodenal stenosis.  He has an appt with Nicoletta Ba on 8/12. Dr Rush Landmark do you have any further recommendations prior to that appt.?

## 2019-01-05 NOTE — Telephone Encounter (Signed)
Pt's wife reported that pt has abdominal discomfort and loss of appetite.  She requested a call back to discuss.

## 2019-01-05 NOTE — Telephone Encounter (Signed)
Please let patient know and wife know that I am sorry to hear that symptoms may be recurring. Lets make sure that there is not a issue with any gallstone or biliary sludge returning. Please have him set up for a CBC/CMP. Please have him do an abdominal x-ray 2 view (supine/upright). I would have the patient go on a liquid diet consisting of protein shakes as well as a soft/pured diet if he is having symptoms and his labs returned normal. Please let them know if his blood counts or liver tests are off he may need to come into the hospital if he is having any progressive issues including possible fevers then he needs to call and let us know and come into the hospital potentially. We may end up needing to get him set up for a duodenal dilation dependent on how he is doing and on follow-up with me.  GM

## 2019-01-06 ENCOUNTER — Ambulatory Visit (INDEPENDENT_AMBULATORY_CARE_PROVIDER_SITE_OTHER)
Admission: RE | Admit: 2019-01-06 | Discharge: 2019-01-06 | Disposition: A | Discharge status: Home / Self Care | Payer: Medicare Other | Source: Ambulatory Visit | Attending: Gastroenterology | Admitting: Gastroenterology

## 2019-01-06 ENCOUNTER — Other Ambulatory Visit: Payer: Self-pay

## 2019-01-06 ENCOUNTER — Other Ambulatory Visit (INDEPENDENT_AMBULATORY_CARE_PROVIDER_SITE_OTHER): Payer: Medicare Other

## 2019-01-06 DIAGNOSIS — K315 Obstruction of duodenum: Secondary | ICD-10-CM | POA: Diagnosis not present

## 2019-01-06 DIAGNOSIS — R1013 Epigastric pain: Secondary | ICD-10-CM | POA: Diagnosis not present

## 2019-01-06 LAB — CBC WITH DIFFERENTIAL/PLATELET
Basophils Absolute: 0 10*3/uL (ref 0.0–0.1)
Basophils Relative: 0.6 % (ref 0.0–3.0)
Eosinophils Absolute: 0.2 10*3/uL (ref 0.0–0.7)
Eosinophils Relative: 2.5 % (ref 0.0–5.0)
HCT: 44.2 % (ref 39.0–52.0)
Hemoglobin: 15.1 g/dL (ref 13.0–17.0)
Lymphocytes Relative: 38.1 % (ref 12.0–46.0)
Lymphs Abs: 2.9 10*3/uL (ref 0.7–4.0)
MCHC: 34 g/dL (ref 30.0–36.0)
MCV: 98.1 fl (ref 78.0–100.0)
Monocytes Absolute: 0.6 10*3/uL (ref 0.1–1.0)
Monocytes Relative: 7.3 % (ref 3.0–12.0)
Neutro Abs: 4 10*3/uL (ref 1.4–7.7)
Neutrophils Relative %: 51.5 % (ref 43.0–77.0)
Platelets: 274 10*3/uL (ref 150.0–400.0)
RBC: 4.51 Mil/uL (ref 4.22–5.81)
RDW: 13.1 % (ref 11.5–15.5)
WBC: 7.7 10*3/uL (ref 4.0–10.5)

## 2019-01-06 LAB — COMPREHENSIVE METABOLIC PANEL
ALT: 34 U/L (ref 0–53)
AST: 21 U/L (ref 0–37)
Albumin: 4.5 g/dL (ref 3.5–5.2)
Alkaline Phosphatase: 66 U/L (ref 39–117)
BUN: 22 mg/dL (ref 6–23)
CO2: 29 mEq/L (ref 19–32)
Calcium: 9.8 mg/dL (ref 8.4–10.5)
Chloride: 102 mEq/L (ref 96–112)
Creatinine, Ser: 0.62 mg/dL (ref 0.40–1.50)
GFR: 128.69 mL/min (ref >60.00)
Glucose, Bld: 105 mg/dL — ABNORMAL HIGH (ref 70–99)
Potassium: 4.2 mEq/L (ref 3.5–5.1)
Sodium: 138 mEq/L (ref 135–145)
Total Bilirubin: 0.7 mg/dL (ref 0.2–1.2)
Total Protein: 7.7 g/dL (ref 6.0–8.3)

## 2019-01-09 IMAGING — DX DG CHEST 2V
2 series · 2 of 2 positions shown · non-contrast
Comparison: 01/07/2018

CLINICAL DATA: Follow-up interstitial change

EXAM:
CHEST - 2 VIEW

[chest pa]
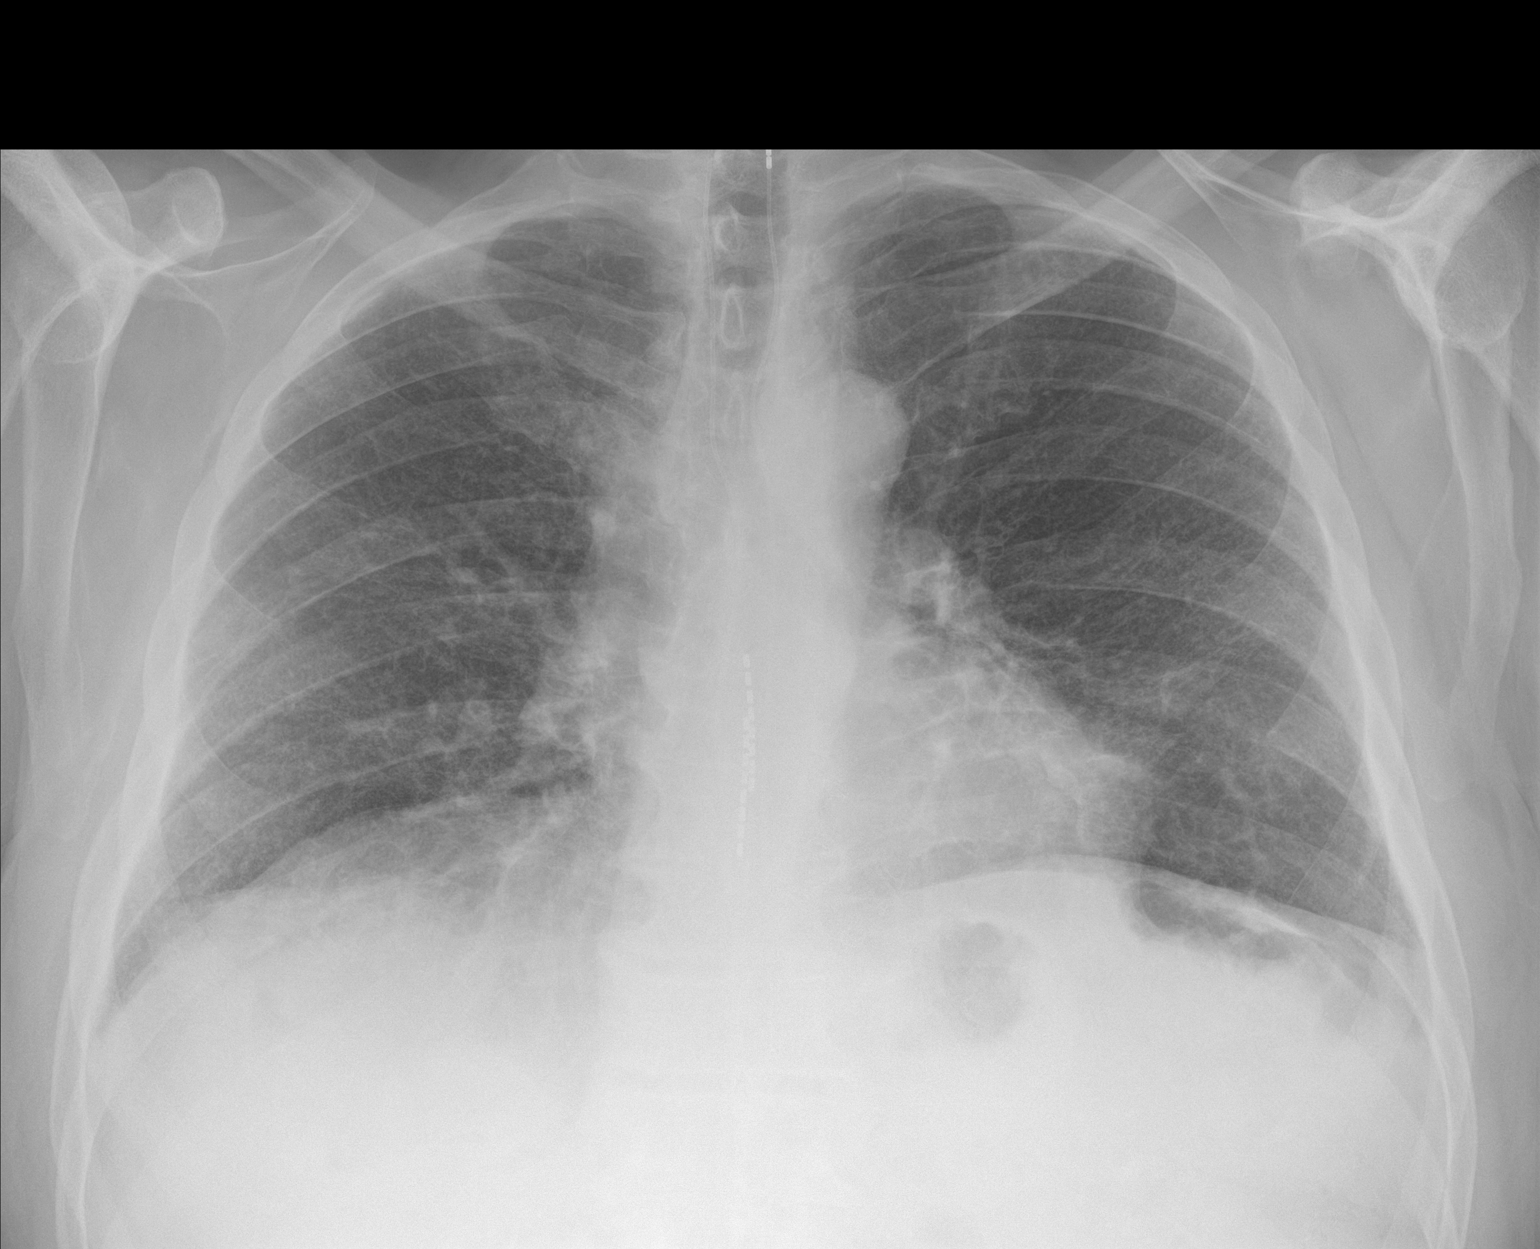

[chest lat]
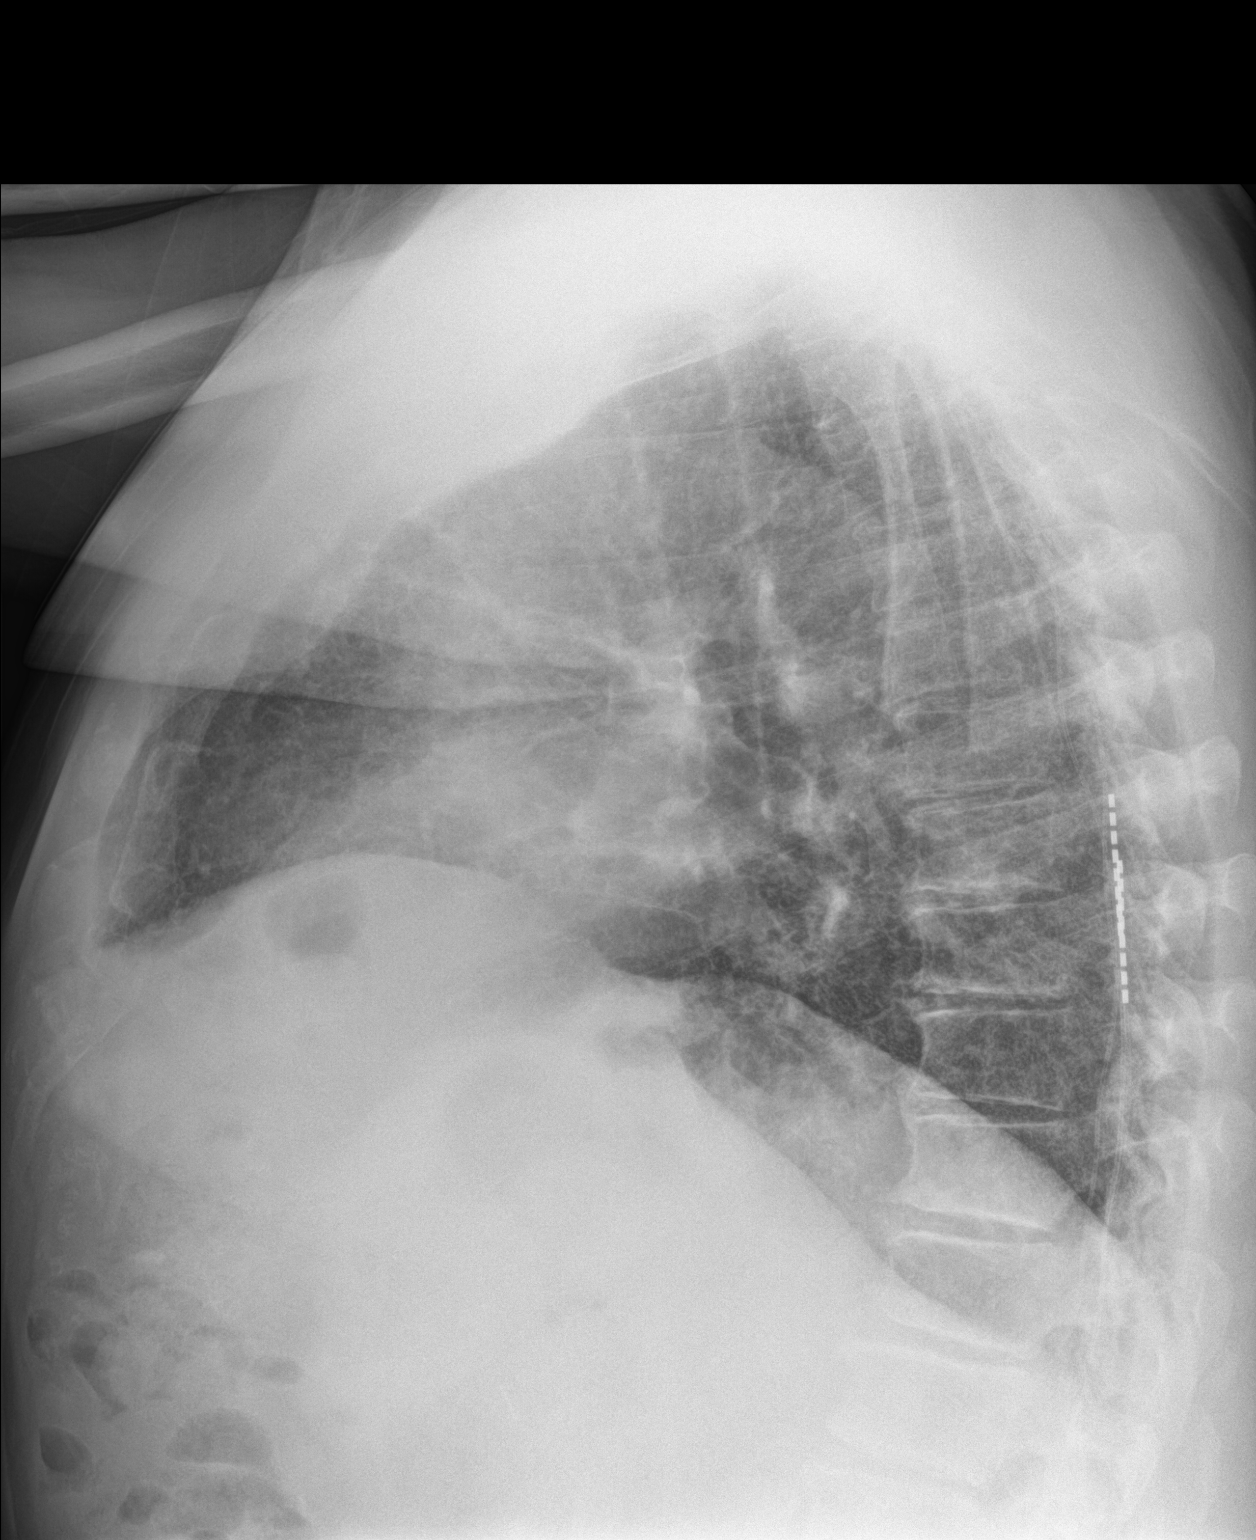

[2 of 2 positions shown; findings below may reference images not displayed]

FINDINGS: Cardiac shadow is stable. The lungs are slightly improved with
aeration. Mild persistent interstitial changes are noted chronic in
nature. No focal infiltrate is seen. Spinal stimulators are again
noted. No acute bony abnormality is seen.
IMPRESSION: Chronic changes without acute abnormality.

## 2019-01-11 ENCOUNTER — Ambulatory Visit: Payer: Medicare Other | Admitting: Physician Assistant

## 2019-01-12 ENCOUNTER — Telehealth: Payer: Self-pay

## 2019-01-12 NOTE — Telephone Encounter (Signed)
The pt has an appointment with Janett Billow on 8/28

## 2019-01-12 NOTE — Telephone Encounter (Signed)
-----   Message from Irving Copas., MD sent at 01/11/2019  6:01 PM EDT ----- Regarding: RE: Upcoming patient visit Amy, thank you for the update. Christhoper Busbee, can you check in with the [facility] and see what happened. Please get back on schedule in the coming weeks with Pas or myself. Thanks. GM ----- Message ----- From: Leotis Pain Sent: 01/11/2019   9:58 AM EDT To: Irving Copas., MD Subject: RE: Upcoming patient visit                     Gabe- pt no showed for appt today with me ... ----- Message ----- From: Irving Copas., MD Sent: 01/09/2019   5:34 AM EDT To: Alfredia Ferguson, PA-C Subject: Upcoming patient visit                         Amy,All the patient's labs and x-rays look okay at this point.If you feel he needs a repeat scan then I would do a CT abdomen pelvis with IV and oral contrast.If you feel he only needs an upper GI series would plan to do that.I think he will likely need an endoscopy with dilation of his duodenal region based on things however, we can see what you think and then try and get him a list for endoscopic dilation in the hospital the coming weeks dependent on how he is doing.Thanks.GM

## 2019-01-25 ENCOUNTER — Other Ambulatory Visit: Payer: Self-pay | Admitting: Family Medicine

## 2019-01-26 NOTE — Telephone Encounter (Signed)
During OV on 11/29/2018 Dr Darnell Level discussed with patient taking Omeprazole 40 mg 1 daily dose. Noted in the notes. Refill sent for 1 daily.

## 2019-01-27 ENCOUNTER — Ambulatory Visit (INDEPENDENT_AMBULATORY_CARE_PROVIDER_SITE_OTHER): Payer: Medicare Other | Admitting: Gastroenterology

## 2019-01-27 ENCOUNTER — Encounter: Payer: Self-pay | Admitting: Gastroenterology

## 2019-01-27 VITALS — BP 120/60 | HR 76 | Temp 98.0°F | Ht 69.0 in | Wt 218.0 lb

## 2019-01-27 DIAGNOSIS — R1012 Left upper quadrant pain: Secondary | ICD-10-CM | POA: Diagnosis not present

## 2019-01-27 DIAGNOSIS — K315 Obstruction of duodenum: Secondary | ICD-10-CM | POA: Diagnosis not present

## 2019-01-27 NOTE — Progress Notes (Signed)
01/27/2019 JEIEL KASSIN [id number] 01-11-1951   HISTORY OF PRESENT ILLNESS:  Alexander [name] is a 68 y.o. male who is a patient of Dr. Donneta Romberg.  Has a PMH significant for CAD, HTN, pAFib, s/p CCK, hx of recent choledocholithiasis (status post ERCP with sphincterotomy and balloon sweeping), colon polyps (tubular adenoma), duodenal stricture (status post dilation).     07/06/2018 EGD by Dr. Rush Landmark: - No gross lesions in esophagus. - J-shaped gastric deformity. - Acquired duodenal stenosis just proximal to the ampulla. Dilated to 18 mm sequentially. - Patent sphincterotomy was found. - Mild deformity in the angle/turns of the D2/D3 region - not clear if a result of adhesive disease. However, normal mucosa was found in the second and third portions of the duodenum.  At his follow-up with Dr. Rush Landmark in March he was doing very well.  Comes in today for evaluation of LUQ abdominal pain and fatigue.  He says that these symptoms actually began 6 or 8 weeks ago and he is feeling much better at this point with no further pain.  Says that he thinks the pain was a pulled muscle.  Did not have any nausea or vomiting.  Came in today to appease his wife.  Is on omeprazole 40 mg daily. Admits that he eats very quickly.  Says that he eats a lot of salads, fresh vegetables, etc despite Dr. Donneta Romberg recommendation to maintain a soft, low fiber/low residue diet at the time of his procedure.   Past Medical History:  Diagnosis Date  . Acute cholecystitis 03/2016   with sepsis  . Arthritis   . CAD S/P percutaneous coronary angioplasty 1995, 2000, 2005   a. 1995 s/p BMS;  b. 2000 s/p BMS;  c. 2005 s/p stent - All stents in [facility] (RCA, LAD & OM - unknown on which date);  d. 04/2015 NSTEMI/Cath: LM nl, ost/p LAD 20%, patent mLAD stent, RI small, OM2 patent stent, pRCA 20%, patent stent, EF 45-50%-->Med Rx.  . Calculus of bile duct with acute cholangitis with obstruction   . Chronic pain  syndrome    a. Followed @ [facility];  b. Uses 12-14 excedrins per day.  . Duodenal stricture   . Essential hypertension   . Fatty liver   . Gallstones   . GERD (gastroesophageal reflux disease)   . History of depression   . History of post poliomyelitis muscular atrophy   . HLD (hyperlipidemia)   . Ischemic cardiomyopathy    a. 04/2015 Echo: EF 40-45%, mod antsept, ant, antlat, apical HK, mild AI/MR, PASP 40mmHg.  . Migraines   . 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 NSTEMI (non-ST elevated myocardial infarction) ([place]) 04/2015   - Demand Ischemia in setting of Sepsis (also with prior MI history  . OSA (obstructive sleep apnea)    does not want to use CPAP, using mouth guard  . PAF (paroxysmal atrial fibrillation) ([place])    a. 04/2015 in setting of cholecystits and sepsis -->Amio;  b. CHA2DS2VASc = 4--> Eliquis.  . Pneumonia 04/2015  . Post-polio syndrome    a. ambulates with braces.  . Shoulder pain, right    Past Surgical History:  Procedure Laterality Date  . BALLOON DILATION N/A 06/06/2018   Procedure: BALLOON DILATION;  Surgeon: Rush Landmark Telford Nab., MD;  Location: [place];  Service: Gastroenterology;  Laterality: N/A;  . BALLOON DILATION N/A 07/06/2018   Procedure: BALLOON DILATION;  Surgeon: Irving Copas., MD;  Location: [facility]OS[facility];  Service: Gastroenterology;  Laterality: N/A;  . BILIARY STENT PLACEMENT  01/08/2018   Procedure: BILIARY STENT PLACEMENT;  Surgeon: Jackquline Denmark, MD;  Location: [facility] ENDOSCOPY;  Service: Endoscopy;;  . BILIARY STENT PLACEMENT N/A 02/09/2018   Procedure: BILIARY STENT PLACEMENT;  Surgeon: Irving Copas., MD;  Location: [facility];  Service: Gastroenterology;  Laterality: N/A;  . BIOPSY  06/06/2018   Procedure: BIOPSY;  Surgeon: Rush Landmark Telford Nab., MD;  Location: [facility] ENDOSCOPY;  Service: Gastroenterology;;  . CARDIAC CATHETERIZATION N/A 04/30/2015   Procedure: Left Heart Cath and Coronary Angiography;   Surgeon: Troy Sine, MD;  Location: [place] [facility];  Service: Cardiovascular; LM nl, ost/p LAD 20%, patent mLAD stent, RI small, OM2 patent stent, pRCA 20%, patent stent, EF 45-50%-->Med Rx   . cervical spine stimulator  08/2012  . CHOLECYSTECTOMY  06/10/2015   Procedure: LAPAROSCOPIC CHOLECYSTECTOMY;  Surgeon: Mickeal Skinner, MD;  Location: [place];  Service: General;;  . COLONOSCOPY  09/2013   TA x2, rpt 5 yrs ([place])  . COLONOSCOPY WITH ESOPHAGOGASTRODUODENOSCOPY (EGD)  06/2018   reactive gastropathy, tubular adenoma, rpt colonoscopy 5 yrs ([place])  . COLONOSCOPY WITH PROPOFOL N/A 06/06/2018   Procedure: COLONOSCOPY WITH PROPOFOL;  Surgeon: Rush Landmark Telford Nab., MD;  Location: [place];  Service: Gastroenterology;  Laterality: N/A;  . CORONARY ANGIOPLASTY WITH STENT PLACEMENT  1995, 2000, 2005   stents in RCA, LAD & Cx-OM. -Stents were patent by cardiac catheterization in November 2016  . ENDOSCOPIC RETROGRADE CHOLANGIOPANCREATOGRAPHY (ERCP) WITH PROPOFOL N/A 02/09/2018   Procedure: ENDOSCOPIC RETROGRADE CHOLANGIOPANCREATOGRAPHY (ERCP) WITH PROPOFOL;  Surgeon: Rush Landmark Telford Nab., MD;  Location: [facility] ENDOSCOPY;  Service: Gastroenterology;  Laterality: N/A;  . ENDOSCOPIC RETROGRADE CHOLANGIOPANCREATOGRAPHY (ERCP) WITH PROPOFOL N/A 03/30/2018   Procedure: ENDOSCOPIC RETROGRADE CHOLANGIOPANCREATOGRAPHY (ERCP) WITH PROPOFOL;  Surgeon: Rush Landmark Telford Nab., MD;  Location: [facility] ENDOSCOPY;  Service: Gastroenterology;  Laterality: N/A;  . ERCP N/A 01/08/2018   Procedure: ENDOSCOPIC RETROGRADE CHOLANGIOPANCREATOGRAPHY (ERCP);  Surgeon: Jackquline Denmark, MD;  Location: [facility] ENDOSCOPY;  Service: Endoscopy;  Laterality: N/A;  . ESOPHAGOGASTRODUODENOSCOPY N/A 03/30/2018   Procedure: ESOPHAGOGASTRODUODENOSCOPY (EGD);  Surgeon: Irving Copas., MD;  Location: [facility];  Service: Gastroenterology;  Laterality: N/A;  . ESOPHAGOGASTRODUODENOSCOPY (EGD) WITH PROPOFOL N/A 06/06/2018    Procedure: ESOPHAGOGASTRODUODENOSCOPY (EGD) WITH PROPOFOL;  Surgeon: Rush Landmark Telford Nab., MD;  Location: [place];  Service: Gastroenterology;  Laterality: N/A;  . ESOPHAGOGASTRODUODENOSCOPY (EGD) WITH PROPOFOL N/A 07/06/2018   Procedure: ESOPHAGOGASTRODUODENOSCOPY (EGD) WITH PROPOFOL;  Surgeon: Rush Landmark Telford Nab., MD;  Location: [place];  Service: Gastroenterology;  Laterality: N/A;  . lumbar spine stimulator    . POLYPECTOMY  06/06/2018   Procedure: POLYPECTOMY;  Surgeon: Mansouraty, Telford Nab., MD;  Location: [place];  Service: Gastroenterology;;  . REMOVAL OF STONES  03/30/2018   Procedure: REMOVAL OF STONES;  Surgeon: Irving Copas., MD;  Location: [facility];  Service: Gastroenterology;;  . Joan Mayans  01/08/2018   Procedure: Joan Mayans;  Surgeon: Jackquline Denmark, MD;  Location: [place];  Service: Endoscopy;;  . Joan Mayans  02/09/2018   Procedure: Joan Mayans;  Surgeon: Mansouraty, Telford Nab., MD;  Location: [facility];  Service: Gastroenterology;;  . Lavell Islam REMOVAL  02/09/2018   Procedure: STENT REMOVAL;  Surgeon: Irving Copas., MD;  Location: [facility];  Service: Gastroenterology;;  . Lavell Islam REMOVAL  03/30/2018   Procedure: STENT REMOVAL;  Surgeon: Irving Copas., MD;  Location: [facility];  Service: Gastroenterology;;  . TRANSTHORACIC ECHOCARDIOGRAM  04/2015   EF 40-45%, mod antsept,  ant, antlat, apical HK, mild AI/MR, PASP 65mmHg.  Marland Kitchen UVULOPALATOPHARYNGOPLASTY, TONSILLECTOMY AND SEPTOPLASTY  2002    reports that he quit smoking about 48 years ago. His smoking use included cigarettes. He has never used smokeless tobacco. He reports current alcohol use. He reports that he does not use drugs. family history includes Breast cancer (age of onset: 56) in his brother; Cancer (age of onset: 39) in his sister; Diabetes in his mother; Heart disease in his mother; Hypertension in his maternal aunt. No Known Allergies     Outpatient Encounter Medications as of 01/27/2019  Medication Sig  . acetaminophen (TYLENOL) 500 MG tablet Take 1,000 mg by mouth every 6 (six) hours as needed for moderate pain or headache.   . Ascorbic Acid (VITAMIN C) 1000 MG tablet Take 1,000 mg by mouth daily.  Marland Kitchen atorvastatin (LIPITOR) 80 MG tablet Take 1 tablet (80 mg total) by mouth daily at 6 PM.  . Carboxymethylcellulose Sodium (LUBRICANT EYE DROPS OP) Place 2 drops into both eyes daily as needed (for dry eyes).  . Cholecalciferol (VITAMIN D3) 50 MCG (2000 UT) TABS Take 2,000 Units by mouth daily.  Marland Kitchen gabapentin (NEURONTIN) 600 MG tablet TAKE 2 TABLETS 3 TIMES A DAY  . lisinopril (PRINIVIL,ZESTRIL) 2.5 MG tablet Take 1 tablet (2.5 mg total) by mouth daily.  . Magnesium 250 MG TABS Take 1 tablet (250 mg total) by mouth at bedtime. (Patient taking differently: Take 250 mg by mouth at bedtime. )  . Multiple Vitamin (MULTIVITAMIN WITH MINERALS) TABS tablet Take 1 tablet by mouth daily.  . Omega-3 Fatty Acids (FISH OIL) 1000 MG CAPS Take 1,000 mg by mouth daily.   Marland Kitchen omeprazole (PRILOSEC) 40 MG capsule Take 1 capsule (40 mg total) by mouth daily.  . sildenafil (VIAGRA) 100 MG tablet TAKE ONE-HALF TO ONE TABLET BY MOUTH DAILY AS NEEDED FOR ERECTILE DYSFUNCTION (Patient taking differently: Take 50-100 mg by mouth daily as needed for erectile dysfunction. )  . vitamin B-12 (CYANOCOBALAMIN) 1000 MCG tablet Take 1,000 mcg by mouth daily.  . vitamin E 400 UNIT capsule Take 400 Units by mouth daily.   No facility-administered encounter medications on file as of 01/27/2019.      REVIEW OF SYSTEMS  : All other systems reviewed and negative except where noted in the History of Present Illness.   PHYSICAL EXAM: BP 120/60   Pulse 76   Temp 98 F (36.7 C) (Oral)   Ht 5\' 9"  (1.753 m)   Wt 218 lb (98.9 kg)   BMI 32.19 kg/m  General: Well developed white male in no acute distress Head: Normocephalic and atraumatic Eyes:  Sclerae anicteric,  conjunctiva pink. Ears: Normal auditory acuity Lungs: Clear throughout to auscultation; no increased WOB. Heart: Regular rate and rhythm; no M/R/G. Abdomen: Soft, non-distended.  BS present.  Non-tender. Musculoskeletal: Symmetrical with no gross deformities  Skin: No lesions on visible extremities Extremities: No edema  Neurological: Alert oriented x 4, grossly non-focal Psychological:  Alert and cooperative. Normal mood and affect  ASSESSMENT AND PLAN: 68 y.o. male who is a patient of Dr. Donneta Romberg.  Has a PMH significant for CAD, HTN, pAFib, s/p CCK, hx of recent choledocholithiasis (status post ERCP with sphincterotomy and balloon sweeping), colon polyps (tubular adenoma), duodenal stricture (status post dilation).  Comes to the office today for complaint of LUQ abdominal pain.  Says that he no longer has pain (had started 6 or 8 weeks ago), says that it felt muscular; could very well have been  a muscle strain.  Was fatigued as well but that has also resolved.  Unsure what exactly the cause of his symptoms was, but I do not think this is an issue with his duodenal stenosis.  At this point he feels well and he is agreeable that we are not going to proceed with any further testing or imaging at this time.  We did discuss that he should remain on a soft/low fiber/low residue diet, avoiding a lot of salads, uncooked fruits and vegetables, etc.  Was also advised to chew his food well and eat slower so to prevent further issues with obstruction from the duodenal stenosis in the future.  Will continue omeprazole 40 mg daily for now.  CC:  [name], MD

## 2019-01-27 NOTE — Patient Instructions (Signed)
Remain on a low fiber/soft diet as discussed at your visit today.  Eat your food slower and chew your food well.

## 2019-02-01 ENCOUNTER — Encounter: Payer: Self-pay | Admitting: Gastroenterology

## 2019-02-01 DIAGNOSIS — R1012 Left upper quadrant pain: Secondary | ICD-10-CM | POA: Insufficient documentation

## 2019-02-01 NOTE — Progress Notes (Signed)
Attending Physician's Attestation   I have reviewed the chart.   I agree with the Advanced Practitioner's note, impression, and recommendations with any updates as below.  Glad to hear that he is doing better.  If any of his symptoms recur, then would plan on repeating EGD with possible dilation of duodenum in hospital-based setting.  Justice Britain, MD [facility] # [phone]

## 2019-04-17 ENCOUNTER — Encounter: Payer: Self-pay | Admitting: Family Medicine

## 2019-06-05 ENCOUNTER — Encounter: Payer: Self-pay | Admitting: Family Medicine

## 2019-06-08 NOTE — Telephone Encounter (Signed)
Faxed rx to [facility] at number provided.  Notified pt via [place].

## 2019-06-08 NOTE — Telephone Encounter (Signed)
See message below. plz fax Rx to [facility] per pt request (fax # in his note). written and in Lisa's box.

## 2019-06-10 ENCOUNTER — Other Ambulatory Visit: Payer: Self-pay | Admitting: Cardiology

## 2019-06-10 ENCOUNTER — Other Ambulatory Visit: Payer: Self-pay | Admitting: Family Medicine

## 2019-06-14 ENCOUNTER — Other Ambulatory Visit: Payer: Self-pay | Admitting: Cardiology

## 2019-06-25 ENCOUNTER — Encounter: Payer: Self-pay | Admitting: Family Medicine

## 2019-06-26 NOTE — Telephone Encounter (Signed)
Spoke with pt scheduling OV on 06/28/19 at 9:00.

## 2019-06-28 ENCOUNTER — Encounter: Payer: Self-pay | Admitting: Family Medicine

## 2019-06-28 ENCOUNTER — Other Ambulatory Visit: Payer: Self-pay

## 2019-06-28 ENCOUNTER — Ambulatory Visit (INDEPENDENT_AMBULATORY_CARE_PROVIDER_SITE_OTHER): Payer: Medicare Other | Admitting: Family Medicine

## 2019-06-28 VITALS — BP 130/82 | HR 82 | Temp 98.1°F | Ht 69.0 in | Wt 221.0 lb

## 2019-06-28 DIAGNOSIS — G14 Postpolio syndrome: Secondary | ICD-10-CM | POA: Diagnosis not present

## 2019-06-28 NOTE — Assessment & Plan Note (Addendum)
He requires fitted bilateral leg braces from hips to toes for ambulation. Current braces are old and cracked. He also uses bilateral canes to walk.

## 2019-06-28 NOTE — Patient Instructions (Signed)
We will fax today's note to [facility].

## 2019-06-28 NOTE — Progress Notes (Signed)
This visit was conducted in person.  BP 130/82 (BP Location: Left Arm, Patient Position: Sitting, Cuff Size: Large)   Pulse 82   Temp 98.1 F (36.7 C) (Temporal)   Ht 5\' 9"  (1.753 m)   Wt 221 lb (100.2 kg)   SpO2 97%   BMI 32.64 kg/m    CC: brace Rx Subjective:    Patient ID: Alexander [name], male    DOB: [birth year], 69 y.o.   MRN: [id number]  HPI: Alexander [name] is a 69 y.o. male presenting on 06/28/2019 for Other (Here to discuss reason he needs to use braces for insurance to cover. )   Known post polio syndrome /poliomyelitis sequelae with residual lower extremity (L>R) weakness on disability, uses fitted lower extremity braces from hips to toes bilaterally regularly.   Needs face to face visit to document he needs and benefits from ongoing brace use. Current braces are 4-5 yrs old, currently have cracks in them, with recent weight loss, no longer form fitting leading to hyperextension of R knee at times.    He notes progressive weakness developing.      Relevant past medical, surgical, family and social history reviewed and updated as indicated. Interim medical history since our last visit reviewed. Allergies and medications reviewed and updated. Outpatient Medications Prior to Visit  Medication Sig Dispense Refill  . acetaminophen (TYLENOL) 500 MG tablet Take 1,000 mg by mouth every 6 (six) hours as needed for moderate pain or headache.     . Ascorbic Acid (VITAMIN C) 1000 MG tablet Take 1,000 mg by mouth daily.    Marland Kitchen atorvastatin (LIPITOR) 80 MG tablet Take 1 tablet (80 mg total) by mouth daily at 6 PM. 90 tablet 0  . Carboxymethylcellulose Sodium (LUBRICANT EYE DROPS OP) Place 2 drops into both eyes daily as needed (for dry eyes).    . Cholecalciferol (VITAMIN D3) 50 MCG (2000 UT) TABS Take 2,000 Units by mouth daily.    Marland Kitchen gabapentin (NEURONTIN) 600 MG tablet TAKE 2 TABLETS BY MOUTH 3  TIMES A DAY 540 tablet 1  . lisinopril (ZESTRIL) 2.5 MG tablet Take 1 tablet (2.5 mg  total) by mouth daily. 90 tablet 0  . Magnesium 250 MG TABS Take 1 tablet (250 mg total) by mouth at bedtime. (Patient taking differently: Take 250 mg by mouth at bedtime. )  0  . Multiple Vitamin (MULTIVITAMIN WITH MINERALS) TABS tablet Take 1 tablet by mouth daily.    . Omega-3 Fatty Acids (FISH OIL) 1000 MG CAPS Take 1,000 mg by mouth daily.     Marland Kitchen omeprazole (PRILOSEC) 40 MG capsule Take 1 capsule (40 mg total) by mouth daily. 90 capsule 3  . sildenafil (VIAGRA) 100 MG tablet TAKE ONE-HALF TO ONE TABLET BY MOUTH DAILY AS NEEDED FOR ERECTILE DYSFUNCTION (Patient taking differently: Take 50-100 mg by mouth daily as needed for erectile dysfunction. ) 6 tablet 3  . vitamin B-12 (CYANOCOBALAMIN) 1000 MCG tablet Take 1,000 mcg by mouth daily.    . vitamin E 400 UNIT capsule Take 400 Units by mouth daily.     No facility-administered medications prior to visit.     Per HPI unless specifically indicated in ROS section below Review of Systems Objective:    BP 130/82 (BP Location: Left Arm, Patient Position: Sitting, Cuff Size: Large)   Pulse 82   Temp 98.1 F (36.7 C) (Temporal)   Ht 5\' 9"  (1.753 m)   Wt 221 lb (100.2 kg)   SpO2 97%  BMI 32.64 kg/m   Wt Readings from Last 3 Encounters:  06/28/19 221 lb (100.2 kg)  01/27/19 218 lb (98.9 kg)  11/29/18 203 lb (92.1 kg)    Physical Exam Vitals and nursing note reviewed.  Constitutional:      Appearance: Normal appearance.  Musculoskeletal:        General: No swelling.     Right lower leg: No edema.     Left lower leg: No edema.     Comments: Atrophy of muscles of legs bilateral lower extremities  Neurological:     Mental Status: He is alert.     Motor: Weakness and atrophy present. No abnormal muscle tone.     Comments:  Diminished strength L>R BLE  4-/5 strength RLE 2/5 strength LLE       Assessment & Plan:  This visit occurred during the SARS-CoV-2 public health emergency.  Safety protocols were in place, including  screening questions prior to the visit, additional usage of staff PPE, and extensive cleaning of exam room while observing appropriate contact time as indicated for disinfecting solutions.   Problem List Items Addressed This Visit    Post-polio syndrome - Primary    He requires fitted bilateral leg braces from hips to toes for ambulation. Current braces are old and cracked. He also uses bilateral canes to walk.           No orders of the defined types were placed in this encounter.  No orders of the defined types were placed in this encounter.   Follow up plan: No follow-ups on file.  [name], MD

## 2019-07-18 ENCOUNTER — Ambulatory Visit (INDEPENDENT_AMBULATORY_CARE_PROVIDER_SITE_OTHER): Payer: Medicare Other | Admitting: Cardiology

## 2019-07-18 ENCOUNTER — Encounter: Payer: Self-pay | Admitting: Cardiology

## 2019-07-18 ENCOUNTER — Other Ambulatory Visit: Payer: Self-pay

## 2019-07-18 VITALS — BP 126/74 | HR 66 | Ht 71.0 in | Wt 224.0 lb

## 2019-07-18 DIAGNOSIS — E785 Hyperlipidemia, unspecified: Secondary | ICD-10-CM | POA: Diagnosis not present

## 2019-07-18 DIAGNOSIS — I214 Non-ST elevation (NSTEMI) myocardial infarction: Secondary | ICD-10-CM

## 2019-07-18 DIAGNOSIS — I10 Essential (primary) hypertension: Secondary | ICD-10-CM

## 2019-07-18 DIAGNOSIS — I2510 Atherosclerotic heart disease of native coronary artery without angina pectoris: Secondary | ICD-10-CM | POA: Diagnosis not present

## 2019-07-18 DIAGNOSIS — I255 Ischemic cardiomyopathy: Secondary | ICD-10-CM | POA: Diagnosis not present

## 2019-07-18 DIAGNOSIS — I480 Paroxysmal atrial fibrillation: Secondary | ICD-10-CM

## 2019-07-18 DIAGNOSIS — R42 Dizziness and giddiness: Secondary | ICD-10-CM

## 2019-07-18 DIAGNOSIS — Z9861 Coronary angioplasty status: Secondary | ICD-10-CM

## 2019-07-18 MED ORDER — ROSUVASTATIN CALCIUM 40 MG PO TABS: 40.0000 mg | ORAL_TABLET | Freq: Every day | ORAL | 3 refills | Status: DC

## 2019-07-18 MED ORDER — METOPROLOL TARTRATE 25 MG PO TABS: ORAL_TABLET | ORAL | 6 refills | Status: DC

## 2019-07-18 NOTE — Patient Instructions (Signed)
Medication Instructions:   Complete your current bottle of atorvastatin, but do not refill  Once that is complete, wait 1 whole month and then start new medicine: Rosuvastatin 40 mg by mouth daily  Discontinue lisinopril  We will start as needed metoprolol tartrate 25 mg for fast heart rate, chest discomfort or anxiousness.  *If you need a refill on your cardiac medications before your next appointment, please call your pharmacy*  Lab Work:  None If you have labs (blood work) drawn today and your tests are completely normal, you will receive your results only by: Marland Kitchen MyChart Message (if you have MyChart) OR . A paper copy in the mail If you have any lab test that is abnormal or we need to change your treatment, we will call you to review the results.  Testing/Procedures:  None  Follow-Up: At [facility], you and your health needs are our priority.  As part of our continuing mission to provide you with exceptional heart care, we have created designated Provider Care Teams.  These Care Teams include your primary Cardiologist (physician) and Advanced Practice Providers (APPs -  Physician Assistants and Nurse Practitioners) who all work together to provide you with the care you need, when you need it.  Your next appointment:   1 year(s)  The format for your next appointment:   In Person  Provider:   Glenetta Hew, MD  Other Instructions None

## 2019-07-18 NOTE — Progress Notes (Signed)
Primary Care Provider: [name], MD Cardiologist: Glenetta Hew, MD Electrophysiologist: None  Clinic Note: Chief Complaint  Patient presents with  . Follow-up  . Dizziness    Has spells where he feels his heart rate go up and feels dizzy, worse with standing.  . Coronary Artery Disease    No angina  . Atrial Fibrillation    Although he feels fast heart rates when dizzy, has not felt irregular fast heart rates.    HPI:    Alexander [name] is a 69 y.o. male with a PMH below who presents today for annual follow-up noting some episodes of dizziness and fatigue.  1. CAD S/P percutaneous coronary angioplasty   2. PAF (paroxysmal atrial fibrillation) ([place])   3. Ischemic cardiomyopathy   4. Essential hypertension   5. Hyperlipidemia with target LDL less than 70     Alexander [name] was last seen on on 07/13/2018 --> we recounted his history of cholelithiasis with ERCP and sepsis etc.  He was hospitalized during October and November 2019 as well as February 2020.  When I saw him he was starting his strength back but was still fatigued.  There was plan to start him on Xarelto during his hospitalization, but that never happened.  Recent Hospitalizations: None  Reviewed  CV studies:    The following studies were reviewed today: (if available, images/films reviewed: From Epic Chart or Care Everywhere) . None:   Interval History:   Alexander [name] presents here today he was in a good mood, but he tells me that he has been having more episodes where he feels dizzy and lightheaded where his heart rate will race up whenever he is having any emotional or social stress.  He will feel tired and dizzy afterwards but has not had any syncope or near syncope. He also notes that he has had more loss of muscle mass in his legs which is caused some issues with his braces.  He is hoping to build to replace his braces soon.  But he is very well losing some of his strength in his legs as far as  meal plus himself up. Otherwise though from a cardiac standpoint he does not notice any regular fast heartbeats.  Is just his heart rate going up with stress and anxiety, and as well as occasionally standing up too fast.  No heart failure symptoms.  No angina.  CV Review of Symptoms (Summary): no chest pain or dyspnea on exertion positive for - rapid heart rate and Associated dizziness but no syncope or near syncope negative for - chest pain, dyspnea on exertion, edema, irregular heartbeat, orthopnea, paroxysmal nocturnal dyspnea, shortness of breath or TIA/amaurosis fugax, claudication  The patient does not have symptoms concerning for COVID-19 infection (fever, chills, cough, or new shortness of breath).  The patient is practicing social distancing & Masking.  Although he is older than 43, his wife is not yet 89.  He is waiting until the age cutoff for COVID-19 vaccine is low enough for her to get it along with him.  He would prefer with a both get their shots together.  Once she is cleared for having shots, he fully plans on doing it.   REVIEWED OF SYSTEMS   Review of Systems  Constitutional: Negative for malaise/fatigue and weight loss.  HENT: Negative for congestion and nosebleeds.   Respiratory: Negative for shortness of breath and wheezing.   Gastrointestinal: Positive for heartburn. Negative for blood in stool and melena.  Genitourinary: Negative for hematuria.  Musculoskeletal: Negative for falls, joint pain and myalgias (Just more than usual weakness).       Needs new braces, has lost more muscle mass  Neurological: Positive for tremors (Has more neuropathy than usual.).       Left leg most notably more than right has significant cold intolerance.  Psychiatric/Behavioral: Negative for depression and memory loss. The patient is nervous/anxious (Having spells that sound almost like anxiety attacks). The patient does not have insomnia.     I have reviewed and (if needed) personally  updated the patient's problem list, medications, allergies, past medical and surgical history, social and family history.   PAST MEDICAL HISTORY   Past Medical History:  Diagnosis Date  . Acute cholecystitis 03/2016   with sepsis  . Arthritis   . CAD S/P percutaneous coronary angioplasty 1995, 2000, 2005   a. 1995 s/p BMS;  b. 2000 s/p BMS;  c. 2005 s/p stent - All stents in [facility] (RCA, LAD & OM - unknown on which date);  d. 04/2015 NSTEMI/Cath: LM nl, ost/p LAD 20%, patent mLAD stent, RI small, OM2 patent stent, pRCA 20%, patent stent, EF 45-50%-->Med Rx.  . Calculus of bile duct with acute cholangitis with obstruction   . Chronic pain syndrome    a. Followed @ [facility];  b. Uses 12-14 excedrins per day.  . Duodenal stricture   . Essential hypertension   . Fatty liver   . Gallstones   . GERD (gastroesophageal reflux disease)   . History of depression   . History of post poliomyelitis muscular atrophy   . HLD (hyperlipidemia)   . Ischemic cardiomyopathy    a. 04/2015 Echo: EF 40-45%, mod antsept, ant, antlat, apical HK, mild AI/MR, PASP 56mmHg.  . Migraines   . 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 NSTEMI (non-ST elevated myocardial infarction) ([place]) 04/2015   - Demand Ischemia in setting of Sepsis (also with prior MI history  . OSA (obstructive sleep apnea)    does not want to use CPAP, using mouth guard  . PAF (paroxysmal atrial fibrillation) ([place])    a. 04/2015 in setting of cholecystits and sepsis -->[place];  b. CHA2DS2VASc = 4--> Eliquis.  . Pneumonia 04/2015  . Post-polio syndrome    a. ambulates with braces.  . Shoulder pain, right     PAST SURGICAL HISTORY   Complete past surgical history reviewed. Past Cardiac Procedure history:  Procedure Laterality Date  . CARDIAC CATHETERIZATION N/A 04/30/2015   Procedure: Left Heart Cath and Coronary Angiography;  Surgeon: Troy Sine, MD;  Location: [place] [facility];  Service: Cardiovascular; LM nl,  ost/p LAD 20%, patent mLAD stent, RI small, OM2 patent stent, pRCA 20%, patent stent, EF 45-50%-->Med Rx   . cervical spine stimulator  08/2012   Procedure: COLONOSCOPY WITH PROPOFOL;  Surgeon: Rush Landmark Telford Nab., MD;  Location: [place];  Service: Gastroenterology;  Laterality: N/A;  . CORONARY ANGIOPLASTY WITH STENT PLACEMENT  1995, 2000, 2005   stents in RCA, LAD & Cx-OM. -Stents were patent by cardiac catheterization in November 2016  . TRANSTHORACIC ECHOCARDIOGRAM  04/2015   EF 40-45%, mod antsept, ant, antlat, apical HK, mild AI/MR, PASP 81mmHg.    MEDICATIONS/ALLERGIES   Current Meds  Medication Sig  . acetaminophen (TYLENOL) 500 MG tablet Take 1,000 mg by mouth every 6 (six) hours as needed for moderate pain or headache.   . Ascorbic Acid (VITAMIN C) 1000 MG  tablet Take 1,000 mg by mouth daily.  . Carboxymethylcellulose Sodium (LUBRICANT EYE DROPS OP) Place 2 drops into both eyes daily as needed (for dry eyes).  . Cholecalciferol (VITAMIN D3) 50 MCG (2000 UT) TABS Take 2,000 Units by mouth daily.  Marland Kitchen gabapentin (NEURONTIN) 600 MG tablet TAKE 2 TABLETS BY MOUTH 3  TIMES A DAY  . lisinopril (ZESTRIL) 2.5 MG tablet Take 1 tablet (2.5 mg total) by mouth daily.  . Magnesium 250 MG TABS Take 1 tablet (250 mg total) by mouth at bedtime. (Patient taking differently: Take 250 mg by mouth at bedtime. )  . Multiple Vitamin (MULTIVITAMIN WITH MINERALS) TABS tablet Take 1 tablet by mouth daily.  . Omega-3 Fatty Acids (FISH OIL) 1000 MG CAPS Take 1,000 mg by mouth daily.   Marland Kitchen omeprazole (PRILOSEC) 40 MG capsule Take 1 capsule (40 mg total) by mouth daily.  . sildenafil (VIAGRA) 100 MG tablet TAKE ONE-HALF TO ONE TABLET BY MOUTH DAILY AS NEEDED FOR ERECTILE DYSFUNCTION (Patient taking differently: Take 50-100 mg by mouth daily as needed for erectile dysfunction. )  . vitamin B-12 (CYANOCOBALAMIN) 1000 MCG tablet Take 1,000 mcg by mouth daily.  . vitamin E 400 UNIT capsule Take 400 Units by  mouth daily.  . [DISCONTINUED] atorvastatin (LIPITOR) 80 MG tablet Take 1 tablet (80 mg total) by mouth daily at 6 PM.    No Known Allergies  SOCIAL HISTORY/FAMILY HISTORY   Social History   Tobacco Use  . Smoking status: Former Smoker    Types: Cigarettes    Quit date: 1972    Years since quitting: 49.1  . Smokeless tobacco: Never Used  . Tobacco comment: was very light  Substance Use Topics  . Alcohol use: Yes    Alcohol/week: 0.0 standard drinks    Comment: moderate  . 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  OBJCTIVE -PE, EKG, labs   Wt Readings from Last 3 Encounters:  07/18/19 224 lb (101.6 kg)  06/28/19 221 lb (100.2 kg)  01/27/19 218 lb (98.9 kg)    Physical Exam: BP 126/74   Pulse 66   Ht 5\' 11"  (1.803 m)   Wt 224 lb (101.6 kg)   SpO2 96%   BMI 31.24 kg/m  Physical Exam  Constitutional: He is oriented to person, place, and time. He appears well-developed and well-nourished. No distress.  Healthy-appearing.  Well-groomed  HENT:  Head: Normocephalic and atraumatic.  Neck: No hepatojugular reflux and no JVD present. Carotid bruit is not present.  Cardiovascular: Normal rate, regular rhythm, normal heart sounds and intact distal pulses.  No extrasystoles are present. PMI is not displaced. Exam reveals no gallop and no friction rub.  No murmur heard. Pulmonary/Chest: Effort normal and breath sounds normal. No respiratory distress. He has no  wheezes. He has no rales.  Abdominal: Soft. Bowel sounds are normal. He exhibits no distension. There is no abdominal tenderness.  No HSM  Musculoskeletal:        General: No edema.     Cervical back: Full passive range of motion without pain, normal range of motion and neck supple.     Comments: Both legs are in braces.  Walks with 2 canes/crutches.  Overdeveloped upper body strength.  Neurological: He is alert and oriented to person, place, and time.  Psychiatric: He has a normal mood and affect. His behavior is normal. Judgment and thought content normal.  Vitals reviewed.     Adult ECG Report  Rate: 66 ;  Rhythm: normal sinus rhythm and Nonspecific ST and T wave changes.;   Narrative Interpretation: Stable EKG  Recent Labs:    Lab Results  Component Value Date   CHOL 113 11/21/2018   HDL 50 11/21/2018   LDLCALC 49 11/21/2018   LDLDIRECT 213.1 06/29/2013   TRIG 69 11/21/2018   CHOLHDL 2.3 11/21/2018   Lab Results  Component Value Date   CREATININE 0.62 01/06/2019   BUN 22 01/06/2019   NA 138 01/06/2019   K 4.2 01/06/2019   CL 102 01/06/2019   CO2 29 01/06/2019   Lab Results  Component Value Date   TSH 0.62 05/02/2018    ASSESSMENT/PLAN    Problem List Items Addressed This Visit    Hyperlipidemia with target LDL less than 70 (Chronic)    Last lipid check was in June 2020.  Excellent results.  I am a little worried about his loss of muscle mass and strength.  I also do not like him being on 80 mg of atorvastatin.  Would like to get the same potency but with less dose. Plan: Convert from atorvastatin 80 mg daily to rosuvastatin 40 mg daily upon completion of current bottle of atorvastatin.      Relevant Medications   rosuvastatin (CRESTOR) 40 MG tablet   metoprolol tartrate (LOPRESSOR) 25 MG tablet   Essential hypertension (Chronic)   Relevant Medications   rosuvastatin (CRESTOR) 40 MG tablet   metoprolol tartrate (LOPRESSOR) 25 MG tablet   NSTEMI (non-ST  elevated myocardial infarction) ([place]) (Chronic)    Distant history of a non-STEMI, but did have some demand ischemia in the setting of sepsis back in 2017 and 2019. EF slightly reduced when he was sick, but it is now more stable in the 45 to 45% range.      Relevant Medications   rosuvastatin (CRESTOR) 40 MG tablet   metoprolol tartrate (LOPRESSOR) 25 MG tablet   Other Relevant Orders   EKG 12-Lead (Completed)   Ischemic cardiomyopathy (Chronic)    Mild mildly reduced EF by echo.  But no active heart failure symptoms.  No PND, orthopnea or edema.  I was having him on the ACE inhibitor for afterload reduction, but with his dizziness, I think I would just prefer to stop the ACE inhibitor. We did stop his beta-blocker in the past because of bradycardia and fatigue.  We will continue to monitor him off of any medications.  He is not to be on beta-blocker, ACE inhibitor/ARB or diuretic.      Relevant Medications   rosuvastatin (CRESTOR) 40 MG tablet   metoprolol tartrate (LOPRESSOR) 25 MG tablet   Other Relevant Orders   EKG 12-Lead (Completed)   PAF (paroxysmal atrial fibrillation) ([place]); CHA2DS2VASc = 4--> Eliquis. (Chronic)    As far as I can tell, he has not had any issues since the breakthrough spell back in 2016.  Had been on Eliquis for a while and then potentially Xarelto,  but we stopped in 2018 since he had had no further episodes.  Continue to monitor, but I think for now unless there is breakthrough spells, would not treat      Relevant Medications   rosuvastatin (CRESTOR) 40 MG tablet   metoprolol tartrate (LOPRESSOR) 25 MG tablet   CAD S/P percutaneous coronary angioplasty - Primary (Chronic)    Very distant history of PCI most recent stent was actually in 2005.  Stents were patent as of 2016.  At this point, he is no longer on beta-blocker for fatigue.  We are stopping ACE inhibitor because of dizziness. I am converting him from atorvastatin to rosuvastatin.  He does take  intermittent aspirin but has not been taking it regularly.  I did suggest that he take it more regularly      Relevant Medications   rosuvastatin (CRESTOR) 40 MG tablet   metoprolol tartrate (LOPRESSOR) 25 MG tablet   Other Relevant Orders   EKG 12-Lead (Completed)   Postural dizziness    He is definitely set up for having vasovagal or neuro mediated syncope because of poor muscle tone.  I am a little leery about him having drops in blood pressure.  Talk about adequate hydration and we will remove any remaining antihypertensive agent.  I think the tachycardia is probably compensatory.  Would not want to treat unless he starts having more symptoms.          COVID-19 Education: The signs and symptoms of COVID-19 were discussed with the patient and how to seek care for testing (follow up with PCP or arrange E-visit).   The importance of social distancing was discussed today.  I spent a total of 69minutes with the patient. >  50% of the time was spent in direct patient consultation.  Additional time spent with chart review  / charting (studies, outside notes, etc): 6 Total Time: 30 min   Current medicines are reviewed at length with the patient today.  (+/- concerns) n/a   Patient Instructions / Medication Changes & Studies & Tests Ordered   Patient Instructions  Medication Instructions:   Complete your current bottle of atorvastatin, but do not refill  Once that is complete, wait 1 whole month and then start new medicine: Rosuvastatin 40 mg by mouth daily  Discontinue lisinopril  We will start as needed metoprolol tartrate 25 mg for fast heart rate, chest discomfort or anxiousness.  *If you need a refill on your cardiac medications before your next appointment, please call your pharmacy*  Lab Work:  None If you have labs (blood work) drawn today and your tests are completely normal, you will receive your results only by: Marland Kitchen MyChart Message (if you have MyChart) OR . A  paper copy in the mail If you have any lab test that is abnormal or we need to change your treatment, we will call you to review the results.  Testing/Procedures:  None  Follow-Up: At [facility], you and your health needs are our priority.  As part of our continuing mission to provide you with exceptional heart care, we have created designated Provider Care Teams.  These Care Teams include your primary Cardiologist (physician) and Advanced Practice Providers (APPs -  Physician Assistants and Nurse Practitioners) who all work together to provide you with the care you need, when you need it.  Your next appointment:   1 year(s)  The format for your next appointment:   In Person  Provider:   Glenetta Hew, MD  Other  Instructions None     Studies Ordered:   Orders Placed This Encounter  Procedures  . EKG 12-Lead     Glenetta Hew, M.D., M.S. Interventional Cardiologist   Pager # [phone] Phone # [phone] [address] [place],  [address]   Thank you for choosing Heartcare at [facility]!!

## 2019-07-19 ENCOUNTER — Other Ambulatory Visit: Payer: Self-pay

## 2019-07-20 ENCOUNTER — Encounter: Payer: Self-pay | Admitting: Cardiology

## 2019-07-20 NOTE — Assessment & Plan Note (Signed)
Distant history of a non-STEMI, but did have some demand ischemia in the setting of sepsis back in 2017 and 2019. EF slightly reduced when he was sick, but it is now more stable in the 45 to 45% range.

## 2019-07-20 NOTE — Assessment & Plan Note (Signed)
He is definitely set up for having vasovagal or neuro mediated syncope because of poor muscle tone.  I am a little leery about him having drops in blood pressure.  Talk about adequate hydration and we will remove any remaining antihypertensive agent.  I think the tachycardia is probably compensatory.  Would not want to treat unless he starts having more symptoms.

## 2019-07-20 NOTE — Assessment & Plan Note (Signed)
Last lipid check was in June 2020.  Excellent results.  I am a little worried about his loss of muscle mass and strength.  I also do not like him being on 80 mg of atorvastatin.  Would like to get the same potency but with less dose. Plan: Convert from atorvastatin 80 mg daily to rosuvastatin 40 mg daily upon completion of current bottle of atorvastatin.

## 2019-07-20 NOTE — Assessment & Plan Note (Signed)
As far as I can tell, he has not had any issues since the breakthrough spell back in 2016.  Had been on Eliquis for a while and then potentially Xarelto, but we stopped in 2018 since he had had no further episodes.  Continue to monitor, but I think for now unless there is breakthrough spells, would not treat

## 2019-07-20 NOTE — Assessment & Plan Note (Signed)
Mild mildly reduced EF by echo.  But no active heart failure symptoms.  No PND, orthopnea or edema.  I was having him on the ACE inhibitor for afterload reduction, but with his dizziness, I think I would just prefer to stop the ACE inhibitor. We did stop his beta-blocker in the past because of bradycardia and fatigue.  We will continue to monitor him off of any medications.  He is not to be on beta-blocker, ACE inhibitor/ARB or diuretic.

## 2019-07-20 NOTE — Assessment & Plan Note (Signed)
Very distant history of PCI most recent stent was actually in 2005.  Stents were patent as of 2016.  At this point, he is no longer on beta-blocker for fatigue.  We are stopping ACE inhibitor because of dizziness. I am converting him from atorvastatin to rosuvastatin.  He does take intermittent aspirin but has not been taking it regularly.  I did suggest that he take it more regularly

## 2019-07-21 ENCOUNTER — Other Ambulatory Visit: Payer: Self-pay

## 2019-07-21 MED ORDER — METOPROLOL TARTRATE 25 MG PO TABS: ORAL_TABLET | ORAL | 6 refills | Status: DC

## 2019-08-10 ENCOUNTER — Telehealth: Payer: Self-pay | Admitting: *Deleted

## 2019-08-10 NOTE — Telephone Encounter (Signed)
   Primary Cardiologist: Glenetta Hew, MD  Chart reviewed as part of pre-operative protocol coverage. Simple dental extractions are considered low risk procedures per guidelines and generally do not require any specific cardiac clearance. It is also generally accepted that for simple extractions and dental cleanings, there is no need to interrupt blood thinner therapy.   If a more invasive procedure like surgery is planned please provide more detail.   SBE prophylaxis is not required for the patient.  I will route this recommendation to the requesting party via Epic fax function and remove from pre-op pool.  Please call with questions.  Daune Perch, NP 08/10/2019, 12:09 PM

## 2019-08-10 NOTE — Telephone Encounter (Signed)
   [facility] Pre-operative Risk Assessment    Request for surgical clearance:  1. What type of surgery is being performed? CLEANING XRAYS FILLINGS EXTRACTIONS   2. When is this surgery scheduled?  TBD   3. What type of clearance is required (medical clearance vs. Pharmacy clearance to hold med vs. Both)?  MEDICAL  4. Are there any medications that need to be held prior to surgery and how long?  N/A  5. Practice name and name of physician performing surgery?  [facility] [place] [facility]   6. What is your office phone number [phone]    7.   What is your office fax number [phone]  8.   Anesthesia type (None, local, MAC, general) ?  LOCAL WITH/ EPINEPHRINE   Devra Dopp 08/10/2019, 11:37 AM  _________________________________________________________________   (provider comments below)

## 2019-08-25 ENCOUNTER — Ambulatory Visit: Payer: Medicare Other | Attending: Internal Medicine

## 2019-08-25 DIAGNOSIS — Z23 Encounter for immunization: Secondary | ICD-10-CM

## 2019-08-25 NOTE — Progress Notes (Signed)
   Covid-19 Vaccination Clinic  Name:  Alexander [name]    MRN: [id number] DOB: [birth year]  08/25/2019  Alexander [name] was observed post Covid-19 immunization for 15 minutes without incident. He was provided with Vaccine Information Sheet and instruction to access the V-Safe system.   Alexander [name] was instructed to call 911 with any severe reactions post vaccine: Marland Kitchen Difficulty breathing  . Swelling of face and throat  . A fast heartbeat  . A bad rash all over body  . Dizziness and weakness   Immunizations Administered    Name Date Dose VIS Date Route   Pfizer COVID-19 Vaccine 08/25/2019  8:19 AM 0.3 mL 05/12/2019 Intramuscular   Manufacturer: [place]   Lot: [id number]   [place]: [id number]

## 2019-08-30 ENCOUNTER — Other Ambulatory Visit: Payer: Self-pay | Admitting: Cardiology

## 2019-09-12 DIAGNOSIS — R2689 Other abnormalities of gait and mobility: Secondary | ICD-10-CM | POA: Diagnosis not present

## 2019-09-19 ENCOUNTER — Ambulatory Visit: Payer: Medicare Other | Attending: Internal Medicine

## 2019-09-19 DIAGNOSIS — Z23 Encounter for immunization: Secondary | ICD-10-CM

## 2019-09-19 NOTE — Progress Notes (Signed)
   Covid-19 Vaccination Clinic  Name:  Alexander [name]    MRN: [id number] DOB: 13-Jan-1951  09/19/2019  [name] was observed post Covid-19 immunization for 15 minutes without incident. He was provided with Vaccine Information Sheet and instruction to access the V-Safe system.   [name] was instructed to call 911 with any severe reactions post vaccine: Marland Kitchen Difficulty breathing  . Swelling of face and throat  . A fast heartbeat  . A bad rash all over body  . Dizziness and weakness   Immunizations Administered    Name Date Dose VIS Date Route   Pfizer COVID-19 Vaccine 09/19/2019  8:06 AM 0.3 mL 07/26/2018 Intramuscular   Manufacturer: [place]   Lot: [id number]   [place]: [id number]

## 2019-10-26 ENCOUNTER — Telehealth: Payer: Self-pay

## 2019-10-26 MED ORDER — SILDENAFIL CITRATE 100 MG PO TABS: ORAL_TABLET | ORAL | 0 refills | Status: DC

## 2019-10-26 NOTE — Telephone Encounter (Signed)
Pt called asking for a refill of Viagra be sent to [facility]. Rx sent electronically.

## 2019-10-29 ENCOUNTER — Other Ambulatory Visit: Payer: Self-pay | Admitting: Family Medicine

## 2019-11-17 ENCOUNTER — Other Ambulatory Visit: Payer: Self-pay | Admitting: Family Medicine

## 2019-11-19 ENCOUNTER — Other Ambulatory Visit: Payer: Self-pay | Admitting: Family Medicine

## 2019-11-20 NOTE — Telephone Encounter (Signed)
Gabapentin Last filled:  09/29/19, #540 Last OV:  06/28/19, post-polio syndrome Next OV:  none

## 2020-02-02 ENCOUNTER — Other Ambulatory Visit: Payer: Self-pay | Admitting: Family Medicine

## 2020-02-27 ENCOUNTER — Other Ambulatory Visit: Payer: Self-pay

## 2020-02-27 ENCOUNTER — Ambulatory Visit (HOSPITAL_COMMUNITY)
Admission: RE | Admit: 2020-02-27 | Discharge: 2020-02-27 | Disposition: A | Discharge status: Home / Self Care | Payer: Medicare Other | Source: Ambulatory Visit | Attending: Family Medicine | Admitting: Family Medicine

## 2020-02-27 ENCOUNTER — Encounter (HOSPITAL_COMMUNITY): Payer: Self-pay

## 2020-02-27 VITALS — BP 129/79 | HR 72 | Temp 98.3°F

## 2020-02-27 DIAGNOSIS — I255 Ischemic cardiomyopathy: Secondary | ICD-10-CM | POA: Insufficient documentation

## 2020-02-27 DIAGNOSIS — E785 Hyperlipidemia, unspecified: Secondary | ICD-10-CM | POA: Diagnosis not present

## 2020-02-27 DIAGNOSIS — Z955 Presence of coronary angioplasty implant and graft: Secondary | ICD-10-CM | POA: Insufficient documentation

## 2020-02-27 DIAGNOSIS — Z79899 Other long term (current) drug therapy: Secondary | ICD-10-CM | POA: Insufficient documentation

## 2020-02-27 DIAGNOSIS — I10 Essential (primary) hypertension: Secondary | ICD-10-CM | POA: Insufficient documentation

## 2020-02-27 DIAGNOSIS — I2510 Atherosclerotic heart disease of native coronary artery without angina pectoris: Secondary | ICD-10-CM | POA: Insufficient documentation

## 2020-02-27 DIAGNOSIS — I252 Old myocardial infarction: Secondary | ICD-10-CM | POA: Diagnosis not present

## 2020-02-27 DIAGNOSIS — Z20822 Contact with and (suspected) exposure to covid-19: Secondary | ICD-10-CM | POA: Insufficient documentation

## 2020-02-27 DIAGNOSIS — J029 Acute pharyngitis, unspecified: Secondary | ICD-10-CM | POA: Diagnosis not present

## 2020-02-27 DIAGNOSIS — G4733 Obstructive sleep apnea (adult) (pediatric): Secondary | ICD-10-CM | POA: Diagnosis not present

## 2020-02-27 DIAGNOSIS — M1990 Unspecified osteoarthritis, unspecified site: Secondary | ICD-10-CM | POA: Insufficient documentation

## 2020-02-27 DIAGNOSIS — K760 Fatty (change of) liver, not elsewhere classified: Secondary | ICD-10-CM | POA: Diagnosis not present

## 2020-02-27 DIAGNOSIS — K219 Gastro-esophageal reflux disease without esophagitis: Secondary | ICD-10-CM | POA: Diagnosis not present

## 2020-02-27 DIAGNOSIS — I480 Paroxysmal atrial fibrillation: Secondary | ICD-10-CM | POA: Insufficient documentation

## 2020-02-27 DIAGNOSIS — Z87891 Personal history of nicotine dependence: Secondary | ICD-10-CM | POA: Insufficient documentation

## 2020-02-27 DIAGNOSIS — R519 Headache, unspecified: Secondary | ICD-10-CM | POA: Insufficient documentation

## 2020-02-27 LAB — POCT RAPID STREP A, ED / UC: Streptococcus, Group A Screen (Direct): NEGATIVE

## 2020-02-27 MED ORDER — CETIRIZINE HCL 10 MG PO TABS: 10.0000 mg | ORAL_TABLET | Freq: Every day | ORAL | 0 refills | Status: DC

## 2020-02-27 NOTE — ED Triage Notes (Signed)
Pt presents with headache, sore throat xs 4 days.

## 2020-02-27 NOTE — Discharge Instructions (Signed)
Your strep test was negative You can check my chart for covid results.  I believe this may be allergy related.  Try taking zyrtec daily.  Follow up as needed for continued or worsening symptoms

## 2020-02-28 LAB — SARS CORONAVIRUS 2 (TAT 6-24 HRS): SARS Coronavirus 2: NEGATIVE

## 2020-02-28 NOTE — ED Provider Notes (Signed)
[place]    CSN: [id number] Arrival date & time: 02/27/20  1246      History   Chief Complaint Chief Complaint  Patient presents with  . Sore Throat  . Headache    HPI Alexander [name] is a 69 y.o. male.   Patient is a 69 year old male who presents today with mild sore throat, headache, PND.  This has been improving over the last 4 days.  Concern due to granddaughter having been diagnosed with strep throat and wife have been similar upper respiratory symptoms.  Would like to be tested for Covid.  Denies any fever, chills.  The symptoms did start after he was out mowing the yard.  Has had pretty severe allergies in the past.  Has not been taking allergy medication. No cough, chest pain, SOB.      Past Medical History:  Diagnosis Date  . Acute cholecystitis 03/2016   with sepsis  . Arthritis   . CAD S/P percutaneous coronary angioplasty 1995, 2000, 2005   a. 1995 s/p BMS;  b. 2000 s/p BMS;  c. 2005 s/p stent - All stents in [facility] (RCA, LAD & OM - unknown on which date);  d. 04/2015 NSTEMI/Cath: LM nl, ost/p LAD 20%, patent mLAD stent, RI small, OM2 patent stent, pRCA 20%, patent stent, EF 45-50%-->Med Rx.  . Calculus of bile duct with acute cholangitis with obstruction   . Chronic pain syndrome    a. Followed @ [facility];  b. Uses 12-14 excedrins per day.  . Duodenal stricture   . Essential hypertension   . Fatty liver   . Gallstones   . GERD (gastroesophageal reflux disease)   . History of depression   . History of post poliomyelitis muscular atrophy   . HLD (hyperlipidemia)   . Ischemic cardiomyopathy    a. 04/2015 Echo: EF 40-45%, mod antsept, ant, antlat, apical HK, mild AI/MR, PASP 63mmHg.  . Migraines   . 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 NSTEMI (non-ST elevated myocardial infarction) ([place]) 04/2015   - Demand Ischemia in setting of Sepsis (also with prior MI history  . OSA (obstructive sleep apnea)    does not want to use CPAP,  using mouth guard  . PAF (paroxysmal atrial fibrillation) ([place])    a. 04/2015 in setting of cholecystits and sepsis -->[place];  b. CHA2DS2VASc = 4--> Eliquis.  . Pneumonia 04/2015  . Post-polio syndrome    a. ambulates with braces.  . Shoulder pain, right     Patient Active Problem List   Diagnosis Date Noted  . Postural dizziness 07/18/2019  . LUQ abdominal pain 02/01/2019  . Health maintenance examination 11/29/2018  . Change in bowel habits 08/18/2018  . Duodenal stenosis 05/06/2018  . S/P ERCP 05/06/2018  . Constipation 05/06/2018  . Choledocholithiasis 05/06/2018  . Interstitial lung disease ([place]) 01/22/2018  . Calculus of bile duct with acute cholangitis with obstruction   . Septic shock ([place]) 01/07/2018  . GERD (gastroesophageal reflux disease) 11/13/2017  . CAD S/P percutaneous coronary angioplasty   . PAF (paroxysmal atrial fibrillation) ([place]); CHA2DS2VASc = 4--> Eliquis.   . Ischemic cardiomyopathy   . NSTEMI (non-ST elevated myocardial infarction) ([place]) 04/27/2015  . Essential hypertension   . Advanced care planning/counseling discussion 07/12/2014  . Obesity, Class I, BMI 30-34.9 07/12/2014  . Scapular dysfunction 06/12/2014  . Medicare annual wellness visit, subsequent 06/29/2013  . Vitamin D deficiency 03/29/2013  . Fatigue 02/27/2013  . Prediabetes  05/09/2012  . Male sexual dysfunction 05/03/2012  . Post-polio syndrome   . Chronic pain syndrome   . OSA (obstructive sleep apnea)   . Migraine   . Hyperlipidemia with target LDL less than 70     Past Surgical History:  Procedure Laterality Date  . BALLOON DILATION N/A 06/06/2018   Procedure: BALLOON DILATION;  Surgeon: Rush Landmark Telford Nab., MD;  Location: [place];  Service: Gastroenterology;  Laterality: N/A;  . BALLOON DILATION N/A 07/06/2018   Procedure: BALLOON DILATION;  Surgeon: Irving Copas., MD;  Location: [place];  Service: Gastroenterology;  Laterality: N/A;  . BILIARY STENT  PLACEMENT  01/08/2018   Procedure: BILIARY STENT PLACEMENT;  Surgeon: Jackquline Denmark, MD;  Location: [facility] ENDOSCOPY;  Service: Endoscopy;;  . BILIARY STENT PLACEMENT N/A 02/09/2018   Procedure: BILIARY STENT PLACEMENT;  Surgeon: Irving Copas., MD;  Location: [facility]OSCOPY;  Service: Gastroenterology;  Laterality: N/A;  . BIOPSY  06/06/2018   Procedure: BIOPSY;  Surgeon: Rush Landmark Telford Nab., MD;  Location: [facility] ENDOSCOPY;  Service: Gastroenterology;;  . CARDIAC CATHETERIZATION N/A 04/30/2015   Procedure: Left Heart Cath and Coronary Angiography;  Surgeon: Troy Sine, MD;  Location: [place] [facility];  Service: Cardiovascular; LM nl, ost/p LAD 20%, patent mLAD stent, RI small, OM2 patent stent, pRCA 20%, patent stent, EF 45-50%-->Med Rx   . cervical spine stimulator  08/2012  . CHOLECYSTECTOMY  06/10/2015   Procedure: LAPAROSCOPIC CHOLECYSTECTOMY;  Surgeon: Mickeal Skinner, MD;  Location: [place];  Service: General;;  . COLONOSCOPY  09/2013   TA x2, rpt 5 yrs ([place])  . COLONOSCOPY WITH ESOPHAGOGASTRODUODENOSCOPY (EGD)  06/2018   reactive gastropathy, tubular adenoma, rpt colonoscopy 5 yrs ([place])  . COLONOSCOPY WITH PROPOFOL N/A 06/06/2018   Procedure: COLONOSCOPY WITH PROPOFOL;  Surgeon: Rush Landmark Telford Nab., MD;  Location: [place];  Service: Gastroenterology;  Laterality: N/A;  . CORONARY ANGIOPLASTY WITH STENT PLACEMENT  1995, 2000, 2005   stents in RCA, LAD & Cx-OM. -Stents were patent by cardiac catheterization in November 2016  . ENDOSCOPIC RETROGRADE CHOLANGIOPANCREATOGRAPHY (ERCP) WITH PROPOFOL N/A 02/09/2018   Procedure: ENDOSCOPIC RETROGRADE CHOLANGIOPANCREATOGRAPHY (ERCP) WITH PROPOFOL;  Surgeon: Rush Landmark Telford Nab., MD;  Location: [facility] ENDOSCOPY;  Service: Gastroenterology;  Laterality: N/A;  . ENDOSCOPIC RETROGRADE CHOLANGIOPANCREATOGRAPHY (ERCP) WITH PROPOFOL N/A 03/30/2018   Procedure: ENDOSCOPIC RETROGRADE CHOLANGIOPANCREATOGRAPHY (ERCP) WITH PROPOFOL;   Surgeon: Rush Landmark Telford Nab., MD;  Location: [facility] ENDOSCOPY;  Service: Gastroenterology;  Laterality: N/A;  . ERCP N/A 01/08/2018   Procedure: ENDOSCOPIC RETROGRADE CHOLANGIOPANCREATOGRAPHY (ERCP);  Surgeon: Jackquline Denmark, MD;  Location: [facility] ENDOSCOPY;  Service: Endoscopy;  Laterality: N/A;  . ESOPHAGOGASTRODUODENOSCOPY N/A 03/30/2018   Procedure: ESOPHAGOGASTRODUODENOSCOPY (EGD);  Surgeon: Irving Copas., MD;  Location: [facility] ENDOSCOPY;  Service: Gastroenterology;  Laterality: N/A;  . ESOPHAGOGASTRODUODENOSCOPY (EGD) WITH PROPOFOL N/A 06/06/2018   Procedure: ESOPHAGOGASTRODUODENOSCOPY (EGD) WITH PROPOFOL;  Surgeon: Rush Landmark Telford Nab., MD;  Location: [place];  Service: Gastroenterology;  Laterality: N/A;  . ESOPHAGOGASTRODUODENOSCOPY (EGD) WITH PROPOFOL N/A 07/06/2018   Procedure: ESOPHAGOGASTRODUODENOSCOPY (EGD) WITH PROPOFOL;  Surgeon: Rush Landmark Telford Nab., MD;  Location: [place];  Service: Gastroenterology;  Laterality: N/A;  . lumbar spine stimulator    . POLYPECTOMY  06/06/2018   Procedure: POLYPECTOMY;  Surgeon: Mansouraty, Telford Nab., MD;  Location: [place];  Service: Gastroenterology;;  . REMOVAL OF STONES  03/30/2018   Procedure: REMOVAL OF STONES;  Surgeon: Irving Copas., MD;  Location: [facility] END[facility];  Service: Gastroenterology;;  . Joan Mayans  01/08/2018   Procedure: SPHINCTEROTOMY;  Surgeon: Jackquline Denmark, MD;  Location: [place];  Service: Endoscopy;;  . Joan Mayans  02/09/2018   Procedure: Joan Mayans;  Surgeon: Mansouraty, Telford Nab., MD;  Location: [facility] END[facility];  Service: Gastroenterology;;  . Lavell Islam REMOVAL  02/09/2018   Procedure: STENT REMOVAL;  Surgeon: Irving Copas., MD;  Location: [facility];  Service: Gastroenterology;;  . Lavell Islam REMOVAL  03/30/2018   Procedure: STENT REMOVAL;  Surgeon: Irving Copas., MD;  Location: [facility] ENDOSCOPY;  Service: Gastroenterology;;  . TRANSTHORACIC ECHOCARDIOGRAM  04/2015   EF  40-45%, mod antsept, ant, antlat, apical HK, mild AI/MR, PASP 25mmHg.  [facility] UVULOPALATOPHARYNGOPLASTY, TONSILLECTOMY AND SEPTOPLASTY  2002       Home Medications    Prior to Admission medications   Medication Sig Start Date End Date Taking? Authorizing Provider  acetaminophen (TYLENOL) 500 MG tablet Take 1,000 mg by mouth every 6 (six) hours as needed for moderate pain or headache.     [provider]  Ascorbic Acid (VITAMIN C) 1000 MG tablet Take 1,000 mg by mouth daily.    [provider]  Carboxymethylcellulose Sodium (LUBRICANT EYE DROPS OP) Place 2 drops into both eyes daily as needed (for dry eyes).    [provider]  cetirizine (ZYRTEC) 10 MG tablet Take 1 tablet (10 mg total) by mouth daily. 02/27/20   Loura Halt A, NP  Cholecalciferol (VITAMIN D3) 50 MCG (2000 UT) TABS Take 2,000 Units by mouth daily.    [provider]  gabapentin (NEURONTIN) 600 MG tablet TAKE 2 TABLETS BY MOUTH 3  TIMES A DAY 11/20/19   [name], MD  lisinopril (ZESTRIL) 2.5 MG tablet TAKE 1 TABLET BY MOUTH  DAILY 08/31/19   Leonie Man, MD  Magnesium 250 MG TABS Take 1 tablet (250 mg total) by mouth at bedtime. Patient taking differently: Take 250 mg by mouth at bedtime.  11/11/17   [name], MD  metoprolol tartrate (LOPRESSOR) 25 MG tablet Take 1 tablet 25 mg as needed for increase heart rate 07/21/19   Leonie Man, MD  Multiple Vitamin (MULTIVITAMIN WITH MINERALS) TABS tablet Take 1 tablet by mouth daily.    [provider]  Omega-3 Fatty Acids (FISH OIL) 1000 MG CAPS Take 1,000 mg by mouth daily.     [provider]  omeprazole (PRILOSEC) 40 MG capsule TAKE 1 CAPSULE BY MOUTH  DAILY 11/17/19   [name], MD  rosuvastatin (CRESTOR) 40 MG tablet Take 1 tablet (40 mg total) by mouth daily. 07/18/19 10/16/19  Leonie Man, MD  sildenafil (VIAGRA) 100 MG tablet TAKE 1/2 TO 1 TABLET BY MOUTH DAILY AS NEEDED FOR ERECTILE DYSFUNCTION  02/06/20   [name], MD  vitamin B-12 (CYANOCOBALAMIN) 1000 MCG tablet Take 1,000 mcg by mouth daily.    [provider]  vitamin E 400 UNIT capsule Take 400 Units by mouth daily.    [provider]    Family History Family History  Problem Relation Age of Onset  . Diabetes Mother   . Heart disease Mother   . Cancer Sister 29       male cancer  . Breast cancer Brother 66  . Hypertension Maternal Aunt   . Coronary artery disease Neg Hx   . Stroke Neg Hx   . Colon cancer Neg Hx   . Pancreatic cancer Neg Hx   . Stomach cancer Neg Hx   . Esophageal cancer Neg Hx   . Inflammatory bowel disease Neg Hx   . Liver  disease Neg Hx   . Rectal cancer Neg Hx     Social History Social History   Tobacco Use  . Smoking status: Former Smoker    Types: Cigarettes    Quit date: 1972    Years since quitting: 49.7  . Smokeless tobacco: Never Used  . Tobacco comment: was very light  Vaping Use  . Vaping Use: Never used  Substance Use Topics  . Alcohol use: Yes    Alcohol/week: 0.0 standard drinks    Comment: moderate  . Drug use: No     Allergies   Patient has no known allergies.   Review of Systems Review of Systems   Physical Exam Triage Vital Signs ED Triage Vitals  Enc Vitals Group     BP 02/27/20 1338 129/79     Pulse Rate 02/27/20 1338 72     Resp --      Temp 02/27/20 1338 98.3 F (36.8 C)     Temp Source 02/27/20 1338 Oral     SpO2 --      Weight --      Height --      Head Circumference --      Peak Flow --      Pain Score 02/27/20 1337 0     Pain Loc --      Pain Edu? --      Excl. in [place]? --    No data found.  Updated Vital Signs BP 129/79 (BP Location: Left Arm)   Pulse 72   Temp 98.3 F (36.8 C) (Oral)   Visual Acuity Right Eye Distance:   Left Eye Distance:   Bilateral Distance:    Right Eye Near:   Left Eye Near:    Bilateral Near:     Physical Exam Vitals and nursing note reviewed.  Constitutional:       Appearance: Normal appearance.  HENT:     Head: Normocephalic and atraumatic.     Right Ear: Tympanic membrane and ear canal normal.     Left Ear: Tympanic membrane and ear canal normal.     Nose: Nose normal.     Mouth/Throat:     Pharynx: Oropharynx is clear.     Comments: PND  Eyes:     Conjunctiva/sclera: Conjunctivae normal.  Cardiovascular:     Rate and Rhythm: Normal rate and regular rhythm.  Pulmonary:     Effort: Pulmonary effort is normal.     Breath sounds: Normal breath sounds.  Musculoskeletal:        General: Normal range of motion.     Cervical back: Normal range of motion.  Skin:    General: Skin is warm and dry.  Neurological:     Mental Status: He is alert.  Psychiatric:        Mood and Affect: Mood normal.      UC Treatments / Results  Labs (all labs ordered are listed, but only abnormal results are displayed) Labs Reviewed  SARS CORONAVIRUS 2 (TAT 6-24 HRS)  CULTURE, GROUP A STREP [facility])  POCT RAPID STREP A, ED / UC    EKG   Radiology No results found.  Procedures Procedures (including critical care time)  Medications Ordered in UC Medications - No data to display  Initial Impression / Assessment and Plan / UC Course  I have reviewed the triage vital signs and the nursing notes.  Pertinent labs & imaging results that were available during my care of the patient were reviewed by  me and considered in my medical decision making (see chart for details).     Sore throat Rapid strep test negative today.  Sending for culture. Covid swab pending.  Most likely symptoms are allergy related.  Recommended starting Zyrtec daily. Follow up as needed for continued or worsening symptoms  Final Clinical Impressions(s) / UC Diagnoses   Final diagnoses:  Sore throat     Discharge Instructions     Your strep test was negative You can check my chart for covid results.  I believe this may be allergy related.  Try taking zyrtec daily.  Follow  up as needed for continued or worsening symptoms     ED Prescriptions    Medication Sig Dispense Auth. Provider   cetirizine (ZYRTEC) 10 MG tablet Take 1 tablet (10 mg total) by mouth daily. 30 tablet Loura Halt A, NP     PDMP not reviewed this encounter.   Loura Halt A, NP 02/28/20 1052

## 2020-02-29 LAB — CULTURE, GROUP A STREP (THRC)

## 2020-03-20 ENCOUNTER — Other Ambulatory Visit: Payer: Self-pay | Admitting: Cardiology

## 2020-06-25 DIAGNOSIS — Z20822 Contact with and (suspected) exposure to covid-19: Secondary | ICD-10-CM | POA: Diagnosis not present

## 2020-07-26 ENCOUNTER — Ambulatory Visit: Payer: Medicare Other | Admitting: Cardiology

## 2020-07-26 ENCOUNTER — Encounter: Payer: Self-pay | Admitting: Cardiology

## 2020-07-26 ENCOUNTER — Other Ambulatory Visit: Payer: Self-pay

## 2020-07-26 VITALS — BP 106/70 | HR 62 | Ht 71.0 in | Wt 224.0 lb

## 2020-07-26 DIAGNOSIS — R42 Dizziness and giddiness: Secondary | ICD-10-CM | POA: Diagnosis not present

## 2020-07-26 DIAGNOSIS — I2510 Atherosclerotic heart disease of native coronary artery without angina pectoris: Secondary | ICD-10-CM

## 2020-07-26 DIAGNOSIS — J849 Interstitial pulmonary disease, unspecified: Secondary | ICD-10-CM

## 2020-07-26 DIAGNOSIS — Z9861 Coronary angioplasty status: Secondary | ICD-10-CM | POA: Diagnosis not present

## 2020-07-26 DIAGNOSIS — I214 Non-ST elevation (NSTEMI) myocardial infarction: Secondary | ICD-10-CM | POA: Diagnosis not present

## 2020-07-26 DIAGNOSIS — R0609 Other forms of dyspnea: Secondary | ICD-10-CM

## 2020-07-26 DIAGNOSIS — I10 Essential (primary) hypertension: Secondary | ICD-10-CM

## 2020-07-26 DIAGNOSIS — E785 Hyperlipidemia, unspecified: Secondary | ICD-10-CM

## 2020-07-26 DIAGNOSIS — R0600 Dyspnea, unspecified: Secondary | ICD-10-CM | POA: Diagnosis not present

## 2020-07-26 DIAGNOSIS — I480 Paroxysmal atrial fibrillation: Secondary | ICD-10-CM

## 2020-07-26 MED ORDER — METOPROLOL TARTRATE 25 MG PO TABS: ORAL_TABLET | ORAL | 3 refills | Status: DC

## 2020-07-26 NOTE — Progress Notes (Signed)
Primary Care Provider: [name], MD Cardiologist: Glenetta Hew, MD Electrophysiologist: None  Clinic Note: Chief Complaint  Patient presents with  . Follow-up    Annual  . Shortness of Breath    Has been more short of breath than usual since COVID-19 infection.  . Coronary Artery Disease    No angina, just exertional dyspnea   ===================================  ASSESSMENT/PLAN   Problem List Items Addressed This Visit    Postural dizziness    His blood pressures are borderline now, he is definitely set up for neuro mediated syncope with poor muscle tone. Discussed importance of adequate hydration (80 to 100 ounces of water a day). We will DC lisinopril to allow for better blood pressure.  Starting low-dose beta-blocker for palpitations.      DOE (dyspnea on exertion) - Primary    Probably related to post Covid deconditioning, with persistent cough 2D echocardiogram to assess EF, diastolic function and RV pressures.      Relevant Orders   EKG 12-Lead (Completed)   ECHOCARDIOGRAM COMPLETE   Hyperlipidemia with target LDL less than 70 (Chronic)    Is due for labs be checked.  If not checked by PCP soon, we should get them checked.  Well-controlled back in June 2020.  Needs rosuvastatin refilled. He says he is tolerating this better with less myalgias.      Relevant Medications   metoprolol tartrate (LOPRESSOR) 25 MG tablet   Essential hypertension (Chronic)    If anything, his pressures are started to get lower.  With him having some dizziness issues.  My clinician will be to DC lisinopril and start standing dose of metoprolol for his tachycardia.  Plan: DC lisinopril, start metoprolol tartrate 12.5 mg twice daily.  Continue PRN additional 25 mg metoprolol for tachycardia spells.      Relevant Medications   metoprolol tartrate (LOPRESSOR) 25 MG tablet   NSTEMI (non-ST elevated myocardial infarction) (HCC) (Chronic)    Distant history of non-STEMI,  but did have elevated troponin levels with demand ischemia in the setting of sepsis in 2017 and 2019.  EF now back to 45 to 50% range by previous echo.      Relevant Medications   metoprolol tartrate (LOPRESSOR) 25 MG tablet   Other Relevant Orders   EKG 12-Lead (Completed)   ECHOCARDIOGRAM COMPLETE   PAF (paroxysmal atrial fibrillation) ([place]); CHA2DS2VASc = 4--> Eliquis. (Chronic)    I cannot tell if he had any further breakthrough episodes of A. fib.  He does have short little bursts which are usually controlled with metoprolol.  Plan: Start low-dose metoprolol 12.5 mg twice daily with PRN additional 25 mg for tachycardia. DC lisinopril to allow for more blood pressure. Not currently on DOAC -> if these palpitation episodes lasting longer, will probably want to check an event monitor.      Relevant Medications   metoprolol tartrate (LOPRESSOR) 25 MG tablet   Other Relevant Orders   EKG 12-Lead (Completed)   ECHOCARDIOGRAM COMPLETE   CAD S/P percutaneous coronary angioplasty (Chronic)    Distant history of PCI.  Stents patent as of 2016.  Not really having any angina.  Just some exertional dyspnea.  Plan: He has had some fatigue with beta-blocker, but with him having some tachycardia spells were to restart low-dose 12.5 mg twice daily and allow for additional 25 mg as needed. Stop ACE inhibitor because of dizziness. He is only taking aspirin every now and then. Continue rosuvastatin      Relevant  Medications   metoprolol tartrate (LOPRESSOR) 25 MG tablet   Other Relevant Orders   EKG 12-Lead (Completed)   ECHOCARDIOGRAM COMPLETE   Interstitial lung disease (HCC) (Chronic)    There was question of underlying lung disease which could have been exacerbated by COVID-19.  We will check a 2D echocardiogram to assess EF, diastolic function and potentially RV pressures.        ===================================  HPI:    LETROY VAZGUEZ is a 70 y.o. male with a PMH notable for  CAD/ICM, PAF, HTN/HLD who presents today for annual f/u.   1995 s/p BMS;  b. 2000 s/p BMS;  c. 2005 s/p stent - All stents in [facility] (RCA, LAD & OM - unknown on which date);    04/2015 NSTEMI/Cath: LM nl, ost/p LAD 20%, patent mLAD stent, RI small, OM2 patent stent, pRCA 20%, patent stent, EF 45-50%-->Med Rx.  ELIN SEATS was last seen on July 18, 2019.  He was in a great mood but was feeling little more episodes of dizziness and lightheadedness where his heart rate go up fast.  Usually associated with emotional social stress. Feel tired and dizzy at times but no syncope or near syncope.  Otherwise stable from prior standpoint.  No chest pain or dyspnea on exertion.=> Converted from atorvastatin to rosuvastatin 40 mg.  Discontinued lisinopril, started PRN metoprolol tartrate for fast heart rate.  Recent Hospitalizations: None  Had COVID-19 in January.  Still has coughing and dyspnea.  Reviewed  CV studies:    The following studies were reviewed today: (if available, images/films reviewed: From Epic Chart or Care Everywhere) . None:   Interval History:   Alexander [name] returns today along with his wife [name] for annual follow-up.  He was doing fairly well until he had COVID back in January.  Since then he has been having persistent cough and dyspnea.  More exercise intolerance than usual.  Is also been noticing more of his palpitation spells.  The metoprolol does help, and he is he is at acute few times in the last month or 2.  He also has had some episodes of feeling quite lightheaded and dizzy-mostly this is associated with change in position, but also may be related to not drinking enough.  He had an episode while walking in the office today.  The episode the day was borderline near syncopal, but has not had true syncope.  He has not had any chest pain or pressure, but has definitely had more dyspnea.  Mild amount of orthopnea and PND but no significant edema.  CV Review of  Symptoms (Summary): positive for - dyspnea on exertion, edema, irregular heartbeat, orthopnea, palpitations, paroxysmal nocturnal dyspnea, rapid heart rate and Mild exercise intolerance; cough; lightheadedness and near syncope associated with tachycardia. negative for - chest pain, loss of consciousness, shortness of breath or TIA/amaurosis fugax, claudication  The patient no longer has notable symptoms concerning for COVID-19 infection (fever, chills, cough, or new shortness of breath).  Still has shortness of breath but cough improved.  No further fever and chills.  REVIEWED OF SYSTEMS   Review of Systems  Constitutional: Positive for malaise/fatigue (Feels worn out since his COVID-19 infection). Negative for weight loss.  HENT: Negative for congestion and nosebleeds.   Respiratory: Positive for cough and shortness of breath. Negative for sputum production and wheezing.        Per HPI.  Associated with recent COVID-19 infection in January  Gastrointestinal: Negative for abdominal pain, blood  in stool and melena.  Genitourinary: Negative for hematuria.  Musculoskeletal: Positive for joint pain and myalgias. Negative for falls.       He has his new braces.  Neurological: Positive for tingling (Neuropathy) and tremors. Negative for dizziness and headaches.  Psychiatric/Behavioral: Negative for memory loss. The patient is nervous/anxious (Having more the panic attacks and palpitations.  Had one today.).    I have reviewed and (if needed) personally updated the patient's problem list, medications, allergies, past medical and surgical history, social and family history.   PAST MEDICAL HISTORY   Past Medical History:  Diagnosis Date  . Acute cholecystitis 03/2016   with sepsis  . Arthritis   . CAD S/P percutaneous coronary angioplasty 1995, 2000, 2005   a. 1995 s/p BMS;  b. 2000 s/p BMS;  c. 2005 s/p stent - All stents in [facility] (RCA, LAD & OM - unknown on which date);  d. 04/2015  NSTEMI/Cath: LM nl, ost/p LAD 20%, patent mLAD stent, RI small, OM2 patent stent, pRCA 20%, patent stent, EF 45-50%-->Med Rx.  . Calculus of bile duct with acute cholangitis with obstruction   . Chronic pain syndrome    a. Followed @ [facility];  b. Uses 12-14 excedrins per day.  . Duodenal stricture   . Essential hypertension   . Fatty liver   . Gallstones   . GERD (gastroesophageal reflux disease)   . History of depression   . History of post poliomyelitis muscular atrophy   . HLD (hyperlipidemia)   . Ischemic cardiomyopathy    a. 04/2015 Echo: EF 40-45%, mod antsept, ant, antlat, apical HK, mild AI/MR, PASP 53mmHg.  . Migraines   . 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 NSTEMI (non-ST elevated myocardial infarction) ([place]) 04/2015   - Demand Ischemia in setting of Sepsis (also with prior MI history  . OSA (obstructive sleep apnea)    does not want to use CPAP, using mouth guard  . PAF (paroxysmal atrial fibrillation) ([place])    a. 04/2015 in setting of cholecystits and sepsis -->[place];  b. CHA2DS2VASc = 4--> Eliquis.  . Pneumonia 04/2015  . Post-polio syndrome    a. ambulates with braces.  . Shoulder pain, right     PAST SURGICAL HISTORY   Past Surgical History:  Procedure Laterality Date  . BALLOON DILATION N/A 06/06/2018   Procedure: BALLOON DILATION;  Surgeon: Rush Landmark Telford Nab., MD;  Location: [place];  Service: Gastroenterology;  Laterality: N/A;  . BALLOON DILATION N/A 07/06/2018   Procedure: BALLOON DILATION;  Surgeon: Irving Copas., MD;  Location: [place];  Service: Gastroenterology;  Laterality: N/A;  . BILIARY STENT PLACEMENT  01/08/2018   Procedure: BILIARY STENT PLACEMENT;  Surgeon: Jackquline Denmark, MD;  Location: [facility] ENDOSCOPY;  Service: Endoscopy;;  . BILIARY STENT PLACEMENT N/A 02/09/2018   Procedure: BILIARY STENT PLACEMENT;  Surgeon: Irving Copas., MD;  Location: [facility] ENDOSCOPY;  Service: Gastroenterology;  Laterality: N/A;   . BIOPSY  06/06/2018   Procedure: BIOPSY;  Surgeon: Rush Landmark Telford Nab., MD;  Location: [facility] ENDOSCOPY;  Service: Gastroenterology;;  . CARDIAC CATHETERIZATION N/A 04/30/2015   Procedure: Left Heart Cath and Coronary Angiography;  Surgeon: Troy Sine, MD;  Location: [place] CV LAB;  Service: Cardiovascular; LM nl, ost/p LAD 20%, patent mLAD stent, RI small, OM2 patent stent, pRCA 20%, patent stent, EF 45-50%-->Med Rx   . cervical spine stimulator  08/2012  . CHOLECYSTECTOMY  06/10/2015   Procedure: LAPAROSCOPIC CHOLECYSTECTOMY;  Surgeon: Mickeal Skinner, MD;  Location: [place];  Service: General;;  . COLONOSCOPY  09/2013   TA x2, rpt 5 yrs ([place])  . COLONOSCOPY WITH ESOPHAGOGASTRODUODENOSCOPY (EGD)  06/2018   reactive gastropathy, tubular adenoma, rpt colonoscopy 5 yrs ([place])  . COLONOSCOPY WITH PROPOFOL N/A 06/06/2018   Procedure: COLONOSCOPY WITH PROPOFOL;  Surgeon: Rush Landmark Telford Nab., MD;  Location: [place];  Service: Gastroenterology;  Laterality: N/A;  . CORONARY ANGIOPLASTY WITH STENT PLACEMENT  1995, 2000, 2005   stents in RCA, LAD & Cx-OM. -Stents were patent by cardiac catheterization in November 2016  . ENDOSCOPIC RETROGRADE CHOLANGIOPANCREATOGRAPHY (ERCP) WITH PROPOFOL N/A 02/09/2018   Procedure: ENDOSCOPIC RETROGRADE CHOLANGIOPANCREATOGRAPHY (ERCP) WITH PROPOFOL;  Surgeon: Rush Landmark Telford Nab., MD;  Location: [facility] ENDOSCOPY;  Service: Gastroenterology;  Laterality: N/A;  . ENDOSCOPIC RETROGRADE CHOLANGIOPANCREATOGRAPHY (ERCP) WITH PROPOFOL N/A 03/30/2018   Procedure: ENDOSCOPIC RETROGRADE CHOLANGIOPANCREATOGRAPHY (ERCP) WITH PROPOFOL;  Surgeon: Rush Landmark Telford Nab., MD;  Location: [facility] ENDOSCOPY;  Service: Gastroenterology;  Laterality: N/A;  . ERCP N/A 01/08/2018   Procedure: ENDOSCOPIC RETROGRADE CHOLANGIOPANCREATOGRAPHY (ERCP);  Surgeon: Jackquline Denmark, MD;  Location: [facility] ENDOSCOPY;  Service: Endoscopy;  Laterality: N/A;  . ESOPHAGOGASTRODUODENOSCOPY N/A  03/30/2018   Procedure: ESOPHAGOGASTRODUODENOSCOPY (EGD);  Surgeon: Irving Copas., MD;  Location: [facility] ENDOSCOPY;  Service: Gastroenterology;  Laterality: N/A;  . ESOPHAGOGASTRODUODENOSCOPY (EGD) WITH PROPOFOL N/A 06/06/2018   Procedure: ESOPHAGOGASTRODUODENOSCOPY (EGD) WITH PROPOFOL;  Surgeon: Rush Landmark Telford Nab., MD;  Location: [place];  Service: Gastroenterology;  Laterality: N/A;  . ESOPHAGOGASTRODUODENOSCOPY (EGD) WITH PROPOFOL N/A 07/06/2018   Procedure: ESOPHAGOGASTRODUODENOSCOPY (EGD) WITH PROPOFOL;  Surgeon: Rush Landmark Telford Nab., MD;  Location: [place];  Service: Gastroenterology;  Laterality: N/A;  . lumbar spine stimulator    . POLYPECTOMY  06/06/2018   Procedure: POLYPECTOMY;  Surgeon: Mansouraty, Telford Nab., MD;  Location: [place];  Service: Gastroenterology;;  . REMOVAL OF STONES  03/30/2018   Procedure: REMOVAL OF STONES;  Surgeon: Irving Copas., MD;  Location: [facility] ENDOSCOPY;  Service: Gastroenterology;;  . Joan Mayans  01/08/2018   Procedure: Joan Mayans;  Surgeon: Jackquline Denmark, MD;  Location: [place];  Service: Endoscopy;;  . Joan Mayans  02/09/2018   Procedure: SPHINCTEROTOMY;  Surgeon: Mansouraty, Telford Nab., MD;  Location: [facility] ENDOSCOPY;  Service: Gastroenterology;;  . Lavell Islam REMOVAL  02/09/2018   Procedure: STENT REMOVAL;  Surgeon: Irving Copas., MD;  Location: [facility] ENDOSCOPY;  Service: Gastroenterology;;  . Lavell Islam REMOVAL  03/30/2018   Procedure: STENT REMOVAL;  Surgeon: Irving Copas., MD;  Location: [facility] ENDOSCOPY;  Service: Gastroenterology;;  . TRANSTHORACIC ECHOCARDIOGRAM  04/2015   EF 40-45%, mod antsept, ant, antlat, apical HK, mild AI/MR, PASP 44mmHg.  Marland Kitchen UVULOPALATOPHARYNGOPLASTY, TONSILLECTOMY AND SEPTOPLASTY  2002       Immunization History  Administered Date(s) Administered  . Influenza, High Dose Seasonal PF 03/04/2016, 02/21/2018, 02/23/2019  . Influenza, Seasonal, Injecte, Preservative Fre  05/03/2012, 05/05/2014, 02/28/2015  . Influenza,inj,Quad PF,6+ Mos 02/27/2013  . Influenza-Unspecified 03/17/2017  . PFIZER(Purple Top)SARS-COV-2 Vaccination 08/25/2019, 09/19/2019  . Pneumococcal Conjugate-13 04/28/2015  . Pneumococcal Polysaccharide-23 10/30/2016  . Td 05/03/2012  . Zoster 06/29/2013    MEDICATIONS/ALLERGIES   Current Meds  Medication Sig  . acetaminophen (TYLENOL) 500 MG tablet Take 1,000 mg by mouth every 6 (six) hours as needed for moderate pain or headache.   . Ascorbic Acid (VITAMIN C) 1000 MG tablet Take 1,000 mg by mouth daily.  . Carboxymethylcellulose Sodium (LUBRICANT EYE DROPS OP) Place 2 drops into both eyes daily as needed (for dry eyes).  Marland Kitchen  cetirizine (ZYRTEC) 10 MG tablet Take 1 tablet (10 mg total) by mouth daily.  . Cholecalciferol (VITAMIN D3) 50 MCG (2000 UT) TABS Take 2,000 Units by mouth daily.  Marland Kitchen gabapentin (NEURONTIN) 600 MG tablet TAKE 2 TABLETS BY MOUTH 3  TIMES A DAY  . Magnesium 250 MG TABS Take 1 tablet (250 mg total) by mouth at bedtime. (Patient taking differently: Take 250 mg by mouth at bedtime.)  . Multiple Vitamin (MULTIVITAMIN WITH MINERALS) TABS tablet Take 1 tablet by mouth daily.  . Omega-3 Fatty Acids (FISH OIL) 1000 MG CAPS Take 1,000 mg by mouth daily.   Marland Kitchen omeprazole (PRILOSEC) 40 MG capsule TAKE 1 CAPSULE BY MOUTH  DAILY  . rosuvastatin (CRESTOR) 40 MG tablet Take 1 tablet (40 mg total) by mouth daily.  . sildenafil (VIAGRA) 100 MG tablet TAKE 1/2 TO 1 TABLET BY MOUTH DAILY AS NEEDED FOR ERECTILE DYSFUNCTION  . vitamin B-12 (CYANOCOBALAMIN) 1000 MCG tablet Take 1,000 mcg by mouth daily.  . vitamin E 400 UNIT capsule Take 400 Units by mouth daily.  . [DISCONTINUED] lisinopril (ZESTRIL) 2.5 MG tablet TAKE 1 TABLET BY MOUTH  DAILY  . [DISCONTINUED] metoprolol tartrate (LOPRESSOR) 25 MG tablet TAKE 1 TABLET BY MOUTH AS NEEDED FOR INCREASE HEART RATE    No Known Allergies  SOCIAL HISTORY/FAMILY HISTORY   Reviewed in Epic:   Pertinent findings:  Social History   Tobacco Use  . Smoking status: Former Smoker    Types: Cigarettes    Quit date: 1972    Years since quitting: 50.1  . Smokeless tobacco: Never Used  . Tobacco comment: was very light  Vaping Use  . Vaping Use: Never used  Substance Use Topics  . Alcohol use: Yes    Alcohol/week: 0.0 standard drinks    Comment: moderate  . 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  Constitutional:      General: He is not in acute distress.    Appearance: Normal appearance. He is not ill-appearing or toxic-appearing.     Comments: BMI is 31, but he is not obese.  He is extremely barrel chested with very large shoulders-uses bilateral crutches.  Some of the weight noted is actually from his bilateral leg braces.  HENT:     Head: Normocephalic and atraumatic.  Neck:     Vascular: No carotid bruit, hepatojugular reflux or JVD.  Cardiovascular:     Rate and Rhythm: Normal rate and regular rhythm.  No extrasystoles are present.    Chest Wall: PMI is not displaced.     Pulses: Decreased pulses (Decreased but palpable pedal pulses.).     Heart sounds: S1 normal and S2 normal.  Heart sounds are distant. No murmur heard. No friction rub. No gallop. No S4 sounds.   Pulmonary:     Effort: Pulmonary  effort is normal. No respiratory distress.     Breath sounds: Normal breath sounds.  Chest:     Chest wall: No tenderness.  Musculoskeletal:        General: Deformity (Both legs are in braces; walks with bilateral crutches) present. No swelling or tenderness.     Cervical back: Normal range of motion and neck supple.     Comments: Overdeveloped chest and shoulder muscles.  Skin:    General: Skin is warm and dry.     Coloration: Skin is not pale.  Neurological:     Mental Status: He is alert.  Psychiatric:        Mood and Affect: Mood normal.        Behavior: Behavior normal.        Thought Content: Thought content normal.        Judgment: Judgment normal.    Adult ECG Report  Rate: 62 ;  Rhythm: normal sinus rhythm and Normal axis, intervals and durations.;   Narrative Interpretation: Normal EKG.  Recent Labs: Is due for labs to be checked. Lab Results  Component Value Date   CHOL 113 11/21/2018   HDL 50 11/21/2018   LDLCALC 49 11/21/2018   LDLDIRECT 213.1 06/29/2013   TRIG 69 11/21/2018   CHOLHDL 2.3 11/21/2018   Lab Results  Component Value Date   CREATININE 0.62 01/06/2019   BUN 22 01/06/2019   NA 138 01/06/2019   K 4.2 01/06/2019   CL 102 01/06/2019   CO2 29 01/06/2019   CBC Latest Ref Rng & Units 01/06/2019 01/21/2018 01/11/2018  WBC 4.0 - 10.5 K/uL 7.7 8.4 13.0(H)  Hemoglobin 13.0 - 17.0 g/dL 15.1 13.9 13.7  Hematocrit 39.0 - 52.0 % 44.2 40.7 40.2  Platelets 150.0 - 400.0 K/uL 274.0 537.0(H) 132(L)    Lab Results  Component Value Date   TSH 0.62 05/02/2018    ==================================================  COVID-19 Education: The signs and symptoms of COVID-19 were discussed with the patient and how to seek care for testing (follow up with PCP or arrange E-visit).   The importance of social distancing and COVID-19 vaccination was discussed today. The patient is practicing social distancing & Masking.   I spent a total of 33 minutes with the patient  spent in direct patient consultation.  Additional time spent with chart review  / charting (studies, outside notes, etc): 12 min Total Time: 58min   Current medicines are reviewed at length with the patient today.  (+/- concerns) metoprolol does help with palpitations; is out of the statin.  This visit occurred during the SARS-CoV-2 public health emergency.  Safety protocols were in place, including screening questions prior to the visit, additional usage of staff PPE, and extensive cleaning of exam room while observing appropriate contact time as indicated for disinfecting solutions.  Notice: This dictation was prepared with Dragon dictation along with smaller phrase technology. Any transcriptional errors that result from this process are unintentional and may not be corrected upon review.  Patient Instructions / Medication Changes & Studies & Tests Ordered   Patient Instructions  Medication Instructions:  STOP TAKING LISINOPRIL    START TAKING METOPROLOL TARTRATE 12.5 MG  ( 1/2 TABLET OF 25 MG  ) TWICE A DAY  .MAY TAKE AN EXTRA 25 MG TABLET IF NEEDED FOR PALP.   *If you need a refill on  your cardiac medications before your next appointment, please call your pharmacy*   Lab Work:  NOT NEEDED   Testing/Procedures:  WILL BE SCHEDULE [address] Your physician has requested that you have an echocardiogram. Echocardiography is a painless test that uses sound waves to create images of your heart. It provides your doctor with information about the size and shape of your heart and how well your heart's chambers and valves are working. This procedure takes approximately one hour. There are no restrictions for this procedure.    Follow-Up: At [facility], you and your health needs are our priority.  As part of our continuing mission to provide you with exceptional heart care, we have created designated Provider Care Teams.  These Care Teams include your primary  Cardiologist (physician) and Advanced Practice Providers (APPs -  Physician Assistants and Nurse Practitioners) who all work together to provide you with the care you need, when you need it.     Your next appointment:   12 month(s)  The format for your next appointment:   In Person  Provider:   Glenetta Hew, MD   Other Instructions    Studies Ordered:   Orders Placed This Encounter  Procedures  . EKG 12-Lead  . ECHOCARDIOGRAM COMPLETE     Glenetta Hew, M.D., M.S. Interventional Cardiologist   Pager # [phone] Phone # [phone] [address] [place], [place] [address]   Thank you for choosing Heartcare at [facility]!!

## 2020-07-26 NOTE — Patient Instructions (Signed)
Medication Instructions:  STOP TAKING LISINOPRIL    START TAKING METOPROLOL TARTRATE 12.5 MG  ( 1/2 TABLET OF 25 MG  ) TWICE A DAY  .MAY TAKE AN EXTRA 25 MG TABLET IF NEEDED FOR PALP.   *If you need a refill on your cardiac medications before your next appointment, please call your pharmacy*   Lab Work:  NOT NEEDED   Testing/Procedures:  WILL BE SCHEDULE [address] Your physician has requested that you have an echocardiogram. Echocardiography is a painless test that uses sound waves to create images of your heart. It provides your doctor with information about the size and shape of your heart and how well your heart's chambers and valves are working. This procedure takes approximately one hour. There are no restrictions for this procedure.    Follow-Up: At [facility], you and your health needs are our priority.  As part of our continuing mission to provide you with exceptional heart care, we have created designated Provider Care Teams.  These Care Teams include your primary Cardiologist (physician) and Advanced Practice Providers (APPs -  Physician Assistants and Nurse Practitioners) who all work together to provide you with the care you need, when you need it.     Your next appointment:   12 month(s)  The format for your next appointment:   In Person  Provider:   Glenetta Hew, MD   Other Instructions

## 2020-07-30 ENCOUNTER — Other Ambulatory Visit: Payer: Self-pay | Admitting: Cardiology

## 2020-07-30 ENCOUNTER — Encounter: Payer: Self-pay | Admitting: Cardiology

## 2020-07-30 NOTE — Assessment & Plan Note (Signed)
Probably related to post Covid deconditioning, with persistent cough 2D echocardiogram to assess EF, diastolic function and RV pressures.

## 2020-07-30 NOTE — Assessment & Plan Note (Signed)
I cannot tell if he had any further breakthrough episodes of A. fib.  He does have short little bursts which are usually controlled with metoprolol.  Plan: Start low-dose metoprolol 12.5 mg twice daily with PRN additional 25 mg for tachycardia. DC lisinopril to allow for more blood pressure. Not currently on DOAC -> if these palpitation episodes lasting longer, will probably want to check an event monitor.

## 2020-07-30 NOTE — Assessment & Plan Note (Signed)
Distant history of PCI.  Stents patent as of 2016.  Not really having any angina.  Just some exertional dyspnea.  Plan: He has had some fatigue with beta-blocker, but with him having some tachycardia spells were to restart low-dose 12.5 mg twice daily and allow for additional 25 mg as needed. Stop ACE inhibitor because of dizziness. He is only taking aspirin every now and then. Continue rosuvastatin

## 2020-07-30 NOTE — Assessment & Plan Note (Signed)
Is due for labs be checked.  If not checked by PCP soon, we should get them checked.  Well-controlled back in June 2020.  Needs rosuvastatin refilled. He says he is tolerating this better with less myalgias.

## 2020-07-30 NOTE — Assessment & Plan Note (Signed)
There was question of underlying lung disease which could have been exacerbated by COVID-19.  We will check a 2D echocardiogram to assess EF, diastolic function and potentially RV pressures.

## 2020-07-30 NOTE — Assessment & Plan Note (Signed)
Distant history of non-STEMI, but did have elevated troponin levels with demand ischemia in the setting of sepsis in 2017 and 2019.  EF now back to 45 to 50% range by previous echo.

## 2020-07-30 NOTE — Assessment & Plan Note (Signed)
His blood pressures are borderline now, he is definitely set up for neuro mediated syncope with poor muscle tone. Discussed importance of adequate hydration (80 to 100 ounces of water a day). We will DC lisinopril to allow for better blood pressure.  Starting low-dose beta-blocker for palpitations.

## 2020-07-30 NOTE — Assessment & Plan Note (Signed)
If anything, his pressures are started to get lower.  With him having some dizziness issues.  My clinician will be to DC lisinopril and start standing dose of metoprolol for his tachycardia.  Plan: DC lisinopril, start metoprolol tartrate 12.5 mg twice daily.  Continue PRN additional 25 mg metoprolol for tachycardia spells.

## 2020-07-31 ENCOUNTER — Other Ambulatory Visit: Payer: Self-pay | Admitting: Cardiology

## 2020-07-31 ENCOUNTER — Other Ambulatory Visit: Payer: Self-pay

## 2020-07-31 MED ORDER — METOPROLOL TARTRATE 25 MG PO TABS: ORAL_TABLET | ORAL | 3 refills | Status: DC

## 2020-08-01 ENCOUNTER — Other Ambulatory Visit: Payer: Self-pay | Admitting: Cardiology

## 2020-08-01 ENCOUNTER — Other Ambulatory Visit: Payer: Self-pay | Admitting: Family Medicine

## 2020-08-02 NOTE — Telephone Encounter (Signed)
E-scribed refill.  Plz schedule wellness, lab and cpe visits.  

## 2020-08-14 ENCOUNTER — Other Ambulatory Visit: Payer: Self-pay | Admitting: Cardiology

## 2020-08-15 ENCOUNTER — Other Ambulatory Visit: Payer: Self-pay | Admitting: Cardiology

## 2020-08-16 ENCOUNTER — Other Ambulatory Visit: Payer: Self-pay | Admitting: Cardiology

## 2020-08-23 ENCOUNTER — Ambulatory Visit (HOSPITAL_COMMUNITY): Payer: Medicare Other | Attending: Cardiology

## 2020-08-23 ENCOUNTER — Other Ambulatory Visit: Payer: Self-pay

## 2020-08-23 DIAGNOSIS — I214 Non-ST elevation (NSTEMI) myocardial infarction: Secondary | ICD-10-CM

## 2020-08-23 DIAGNOSIS — R0600 Dyspnea, unspecified: Secondary | ICD-10-CM

## 2020-08-23 DIAGNOSIS — Z9861 Coronary angioplasty status: Secondary | ICD-10-CM | POA: Diagnosis not present

## 2020-08-23 DIAGNOSIS — R0609 Other forms of dyspnea: Secondary | ICD-10-CM

## 2020-08-23 DIAGNOSIS — I480 Paroxysmal atrial fibrillation: Secondary | ICD-10-CM

## 2020-08-23 DIAGNOSIS — I2510 Atherosclerotic heart disease of native coronary artery without angina pectoris: Secondary | ICD-10-CM | POA: Diagnosis not present

## 2020-08-23 HISTORY — PX: TRANSTHORACIC ECHOCARDIOGRAM: SHX275

## 2020-08-23 LAB — ECHOCARDIOGRAM COMPLETE
Area-P 1/2: 3.23 cm2
P 1/2 time: 595 msec
S' Lateral: 3.4 cm

## 2020-09-28 DIAGNOSIS — H40033 Anatomical narrow angle, bilateral: Secondary | ICD-10-CM | POA: Diagnosis not present

## 2020-09-28 DIAGNOSIS — H2513 Age-related nuclear cataract, bilateral: Secondary | ICD-10-CM | POA: Diagnosis not present

## 2020-10-06 ENCOUNTER — Other Ambulatory Visit: Payer: Self-pay | Admitting: Family Medicine

## 2020-10-06 DIAGNOSIS — I10 Essential (primary) hypertension: Secondary | ICD-10-CM

## 2020-10-06 DIAGNOSIS — R7303 Prediabetes: Secondary | ICD-10-CM

## 2020-10-06 DIAGNOSIS — Z125 Encounter for screening for malignant neoplasm of prostate: Secondary | ICD-10-CM

## 2020-10-06 DIAGNOSIS — E559 Vitamin D deficiency, unspecified: Secondary | ICD-10-CM

## 2020-10-06 DIAGNOSIS — I480 Paroxysmal atrial fibrillation: Secondary | ICD-10-CM

## 2020-10-06 DIAGNOSIS — E785 Hyperlipidemia, unspecified: Secondary | ICD-10-CM

## 2020-10-09 ENCOUNTER — Ambulatory Visit: Payer: Medicare Other

## 2020-10-09 ENCOUNTER — Telehealth: Payer: Self-pay

## 2020-10-09 ENCOUNTER — Other Ambulatory Visit: Payer: Self-pay

## 2020-10-09 NOTE — Progress Notes (Deleted)
Subjective:   Alexander [name] is a 70 y.o. male who presents for Medicare Annual/Subsequent preventive examination.  Review of Systems: N/A      I connected with the patient today by telephone and verified that I am speaking with the correct person using two identifiers. Location patient: home Location nurse: work Persons participating in the telephone visit: patient, nurse.   I discussed the limitations, risks, security and privacy concerns of performing an evaluation and management service by telephone and the availability of in person appointments. I also discussed with the patient that there may be a patient responsible charge related to this service. The patient expressed understanding and verbally consented to this telephonic visit.              Objective:    There were no vitals filed for this visit. There is no height or weight on file to calculate BMI.  Advanced Directives 11/24/2018 07/06/2018 06/06/2018 03/30/2018 03/28/2018 02/09/2018 01/07/2018  Does Patient Have a Medical Advance Directive? No No No No No No No  Type of Advance Directive - - - - - - -  Would patient like information on creating a medical advance directive? No - Patient declined No - Patient declined No - Patient declined - - No - Patient declined Yes (Inpatient - patient requests chaplain consult to create a medical advance directive)    Current Medications (verified) Outpatient Encounter Medications as of 10/09/2020  Medication Sig  . rosuvastatin (CRESTOR) 40 MG tablet TAKE 1 TABLET BY MOUTH  DAILY  . acetaminophen (TYLENOL) 500 MG tablet Take 1,000 mg by mouth every 6 (six) hours as needed for moderate pain or headache.   . Ascorbic Acid (VITAMIN C) 1000 MG tablet Take 1,000 mg by mouth daily.  . Carboxymethylcellulose Sodium (LUBRICANT EYE DROPS OP) Place 2 drops into both eyes daily as needed (for dry eyes).  . cetirizine (ZYRTEC) 10 MG tablet Take 1 tablet (10 mg total) by mouth daily.  .  Cholecalciferol (VITAMIN D3) 50 MCG (2000 UT) TABS Take 2,000 Units by mouth daily.  Marland Kitchen gabapentin (NEURONTIN) 600 MG tablet TAKE 2 TABLETS BY MOUTH 3  TIMES A DAY  . Magnesium 250 MG TABS Take 1 tablet (250 mg total) by mouth at bedtime. (Patient taking differently: Take 250 mg by mouth at bedtime.)  . metoprolol tartrate (LOPRESSOR) 25 MG tablet TAKE 12.5 MG ( 1/2 TABLET) TWICE A DAY . MAY TAKE AN ADDITIONAL 25 MG IF NEEDED FOR PALPS.  . Multiple Vitamin (MULTIVITAMIN WITH MINERALS) TABS tablet Take 1 tablet by mouth daily.  . Omega-3 Fatty Acids (FISH OIL) 1000 MG CAPS Take 1,000 mg by mouth daily.   Marland Kitchen omeprazole (PRILOSEC) 40 MG capsule TAKE 1 CAPSULE BY MOUTH  DAILY  . sildenafil (VIAGRA) 100 MG tablet TAKE 1/2 TO 1 TABLET BY MOUTH DAILY AS NEEDED FOR ERECTILE DYSFUNCTION  . vitamin B-12 (CYANOCOBALAMIN) 1000 MCG tablet Take 1,000 mcg by mouth daily.  . vitamin E 400 UNIT capsule Take 400 Units by mouth daily.   No facility-administered encounter medications on file as of 10/09/2020.    Allergies (verified) Patient has no known allergies.   History: Past Medical History:  Diagnosis Date  . Acute cholecystitis 03/2016   with sepsis  . Arthritis   . CAD S/P percutaneous coronary angioplasty 1995, 2000, 2005   a. 1995 s/p BMS;  b. 2000 s/p BMS;  c. 2005 s/p stent - All stents in [facility] (RCA, LAD & OM -  unknown on which date);  d. 04/2015 NSTEMI/Cath: LM nl, ost/p LAD 20%, patent mLAD stent, RI small, OM2 patent stent, pRCA 20%, patent stent, EF 45-50%-->Med Rx.  . Calculus of bile duct with acute cholangitis with obstruction   . Chronic pain syndrome    a. Followed @ [facility];  b. Uses 12-14 excedrins per day.  . Duodenal stricture   . Essential hypertension   . Fatty liver   . Gallstones   . GERD (gastroesophageal reflux disease)   . History of depression   . History of post poliomyelitis muscular atrophy   . HLD (hyperlipidemia)   . Ischemic cardiomyopathy    a.  04/2015 Echo: EF 40-45%, mod antsept, ant, antlat, apical HK, mild AI/MR, PASP 33mmHg.  . Migraines   . 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 NSTEMI (non-ST elevated myocardial infarction) ([place]) 04/2015   - Demand Ischemia in setting of Sepsis (also with prior MI history  . OSA (obstructive sleep apnea)    does not want to use CPAP, using mouth guard  . PAF (paroxysmal atrial fibrillation) ([place])    a. 04/2015 in setting of cholecystits and sepsis -->Amio;  b. CHA2DS2VASc = 4--> Eliquis.  . Pneumonia 04/2015  . Post-polio syndrome    a. ambulates with braces.  . Shoulder pain, right    Past Surgical History:  Procedure Laterality Date  . BALLOON DILATION N/A 06/06/2018   Procedure: BALLOON DILATION;  Surgeon: Rush Landmark Telford Nab., MD;  Location: [place];  Service: Gastroenterology;  Laterality: N/A;  . BALLOON DILATION N/A 07/06/2018   Procedure: BALLOON DILATION;  Surgeon: Irving Copas., MD;  Location: [place];  Service: Gastroenterology;  Laterality: N/A;  . BILIARY STENT PLACEMENT  01/08/2018   Procedure: BILIARY STENT PLACEMENT;  Surgeon: Jackquline Denmark, MD;  Location: [facility] ENDOSCOPY;  Service: Endoscopy;;  . BILIARY STENT PLACEMENT N/A 02/09/2018   Procedure: BILIARY STENT PLACEMENT;  Surgeon: Irving Copas., MD;  Location: [facility] ENDOSCOPY;  Service: Gastroenterology;  Laterality: N/A;  . BIOPSY  06/06/2018   Procedure: BIOPSY;  Surgeon: Rush Landmark Telford Nab., MD;  Location: [facility] ENDOSCOPY;  Service: Gastroenterology;;  . CARDIAC CATHETERIZATION N/A 04/30/2015   Procedure: Left Heart Cath and Coronary Angiography;  Surgeon: Troy Sine, MD;  Location: [place] CV LAB;  Service: Cardiovascular; LM nl, ost/p LAD 20%, patent mLAD stent, RI small, OM2 patent stent, pRCA 20%, patent stent, EF 45-50%-->Med Rx   . cervical spine stimulator  08/2012  . CHOLECYSTECTOMY  06/10/2015   Procedure: LAPAROSCOPIC CHOLECYSTECTOMY;  Surgeon: Mickeal Skinner, MD;   Location: [place];  Service: General;;  . COLONOSCOPY  09/2013   TA x2, rpt 5 yrs ([place])  . COLONOSCOPY WITH ESOPHAGOGASTRODUODENOSCOPY (EGD)  06/2018   reactive gastropathy, tubular adenoma, rpt colonoscopy 5 yrs ([place])  . COLONOSCOPY WITH PROPOFOL N/A 06/06/2018   Procedure: COLONOSCOPY WITH PROPOFOL;  Surgeon: Rush Landmark Telford Nab., MD;  Location: [place];  Service: Gastroenterology;  Laterality: N/A;  . CORONARY ANGIOPLASTY WITH STENT PLACEMENT  1995, 2000, 2005   stents in RCA, LAD & Cx-OM. -Stents were patent by cardiac catheterization in November 2016  . ENDOSCOPIC RETROGRADE CHOLANGIOPANCREATOGRAPHY (ERCP) WITH PROPOFOL N/A 02/09/2018   Procedure: ENDOSCOPIC RETROGRADE CHOLANGIOPANCREATOGRAPHY (ERCP) WITH PROPOFOL;  Surgeon: Rush Landmark Telford Nab., MD;  Location: [facility] ENDOSCOPY;  Service: Gastroenterology;  Laterality: N/A;  . ENDOSCOPIC RETROGRADE CHOLANGIOPANCREATOGRAPHY (ERCP) WITH PROPOFOL N/A 03/30/2018   Procedure: ENDOSCOPIC RETROGRADE CHOLANGIOPANCREATOGRAPHY (ERCP) WITH PROPOFOL;  Surgeon: Justice Britain  Brooke Bonito., MD;  Location: [facility]OS[facility];  Service: Gastroenterology;  Laterality: N/A;  . ERCP N/A 01/08/2018   Procedure: ENDOSCOPIC RETROGRADE CHOLANGIOPANCREATOGRAPHY (ERCP);  Surgeon: Jackquline Denmark, MD;  Location[facility]COPY;  Service: Endoscopy;  Laterality: N/A;  . ESOPHAGOGASTRODUODENOSCOPY N/A 03/30/2018   Procedure: ESOPHAGOGASTRODUODENOSCOPY (EGD);  Surgeon: Irving Copas., MD;  Location: [facility] ENDOSCOPY;  Service: Gastroenterology;  Laterality: N/A;  . ESOPHAGOGASTRODUODENOSCOPY (EGD) WITH PROPOFOL N/A 06/06/2018   Procedure: ESOPHAGOGASTRODUODENOSCOPY (EGD) WITH PROPOFOL;  Surgeon: Rush Landmark Telford Nab., MD;  Location: [place];  Service: Gastroenterology;  Laterality: N/A;  . ESOPHAGOGASTRODUODENOSCOPY (EGD) WITH PROPOFOL N/A 07/06/2018   Procedure: ESOPHAGOGASTRODUODENOSCOPY (EGD) WITH PROPOFOL;  Surgeon: Rush Landmark Telford Nab., MD;  Location: [place];  Service: Gastroenterology;  Laterality: N/A;  . lumbar spine stimulator    . POLYPECTOMY  06/06/2018   Procedure: POLYPECTOMY;  Surgeon: Mansouraty, Telford Nab., MD;  Location: [place];  Service: Gastroenterology;;  . REMOVAL OF STONES  03/30/2018   Procedure: REMOVAL OF STONES;  Surgeon: Irving Copas., MD;  Location: [facility] ENDOS[facility];  Service: Gastroenterology;;  . Joan Mayans  01/08/2018   Procedure: Joan Mayans;  Surgeon: Jackquline Denmark, MD;  Location: [place];  Service: Endoscopy;;  . Joan Mayans  02/09/2018   Procedure: SPHINCTEROTOMY;  Surgeon: Mansouraty, Telford Nab., MD;  Location: [facility] ENDOS[facility];  Service: Gastroenterology;;  . Lavell Islam REMOVAL  02/09/2018   Procedure: STENT REMOVAL;  Surgeon: Irving Copas., MD;  Location: [facility]OS[facility];  Service: Gastroenterology;;  . Lavell Islam REMOVAL  03/30/2018   Procedure: STENT REMOVAL;  Surgeon: Irving Copas., MD;  Location: [facility] ENDOSCOPY;  Service: Gastroenterology;;  . TRANSTHORACIC ECHOCARDIOGRAM  04/2015   EF 40-45%, mod antsept, ant, antlat, apical HK, mild AI/MR, PASP 98mmHg.  Marland Kitchen UVULOPALATOPHARYNGOPLASTY, TONSILLECTOMY AND SEPTOPLASTY  2002   Family History  Problem Relation Age of Onset  . Diabetes Mother   . Heart disease Mother   . Cancer Sister 61       male cancer  . Breast cancer Brother 63  . Hypertension Maternal Aunt   . Coronary artery disease Neg Hx   . Stroke Neg Hx   . Colon cancer Neg Hx   . Pancreatic cancer Neg Hx   . Stomach cancer Neg Hx   . Esophageal cancer Neg Hx   . Inflammatory bowel disease Neg Hx   . Liver disease Neg Hx   . Rectal cancer Neg Hx    Social History   Socioeconomic History  . Marital status: Married    Spouse name: Not on file  . Number of children: 3  . Years of education: Not on file  . Highest education level: Not on file  Occupational History  . Occupation: retired  Tobacco Use  . Smoking status: Former Smoker    Types:  Cigarettes    Quit date: 1972    Years since quitting: 50.3  . Smokeless tobacco: Never Used  . Tobacco comment: was very light  Vaping Use  . Vaping Use: Never used  Substance and Sexual Activity  . Alcohol use: Yes    Alcohol/week: 0.0 standard drinks    Comment: moderate  . Drug use: No  . Sexual activity: Yes  Other Topics Concern  . 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  {YES/NO:21197} Does the patient have any non-healing wounds?  {YES/NO:21197} Has the patient had any unintentional weight loss or weight gain?  {YES/NO:21197}  Diabetes:  Is the patient diabetic?  No  If diabetic, was a CBG obtained today?  N/A Did the patient bring in their glucometer from home?  N/A How often do you monitor your CBG's? N/A.   Financial Strains and Diabetes Management:  Are you having any financial strains with the device, your supplies or your medication? N/A.  Does the patient want to be seen by Chronic Care Management for management of their diabetes?  N/A Would the patient like to be referred to a Nutritionist or for Diabetic Management?  N/A           Activities of Daily Living No flowsheet data found.  Patient Care Team: [name],  MD as PCP - General (Family Medicine) Leonie Man, MD as PCP - Cardiology (Cardiology) Leonie Man, MD as Consulting Physician (Cardiology) Wilhelmina Mcardle, MD (Inactive) as Consulting Physician (Pulmonary Disease)  Indicate any recent Medical Services you may have received from other than [facility] providers in the past year (date may be approximate).     Assessment:   This is a routine wellness examination for [place].  Hearing/Vision screen No exam data present  Dietary issues and exercise activities discussed:    Goals Addressed   None    Depression Screen PHQ 2/9 Scores 11/24/2018 11/04/2017 10/30/2016 10/29/2015 07/12/2014 06/29/2013  PHQ - 2 Score 0 0 0 0 0 0  PHQ- 9 Score 0 5 - - - -    Fall Risk Fall Risk  11/24/2018 11/04/2017 12/21/2016 10/30/2016 10/29/2015  Falls in the past year? 0 Yes No Yes Yes  Comment - - Emmi Telephone Survey: data to providers prior to load due to pt's health status, he has multiple falls pt often falls in yard while doing yardwork  Number falls in past yr: - 1 - 2 or more 2 or more  Injury with Fall? - Yes - No No  Risk Factor Category  - - - High Fall Risk High Fall Risk  Risk for fall due to : - - - History of fall(s);Impaired balance/gait;Impaired mobility -  Risk for fall due to: Comment - - - - -  Follow up - - - - Falls evaluation completed    FALL RISK PREVENTION PERTAINING TO THE HOME:  Any stairs in or around the home? Yes  If so, are there any without handrails? No  Home free of loose throw rugs in walkways, pet beds, electrical cords, etc? Yes  Adequate lighting in your home to reduce risk of falls? Yes   ASSISTIVE DEVICES UTILIZED TO PREVENT FALLS:  Life alert? {YES/NO:21197} Use of a cane, walker or w/c? {YES/NO:21197} Grab bars in the bathroom? {YES/NO:21197} Shower chair or bench in shower? {YES/NO:21197} Elevated toilet seat or a  handicapped toilet? {YES/NO:21197}  TIMED UP AND GO:  Was the test performed? N/A telephone  visit .    Cognitive Function: MMSE - Mini Mental State Exam 11/24/2018 11/04/2017 10/30/2016 10/29/2015  Orientation to time 5 5 5 5   Orientation to Place 5 5 5 5   Registration 3 3 3 3   Attention/ Calculation 0 0 0 0  Recall 3 2 3 3   Recall-comments - unable to recall 1 of 3 words - -  Language- name 2 objects 0 0 0 0  Language- repeat 1 1 1 1   Language- follow 3 step command 0 3 3 3   Language- read & follow direction 0 0 0 0  Write a sentence 0 0 0 0  Copy design 0 0 0 0  Total score 17 19 20 20   Mini Cog  Mini-Cog screen was completed. Maximum score is 22. A value of 0 denotes this part of the MMSE was not completed or the patient failed this part of the Mini-Cog screening.       Immunizations Immunization History  Administered Date(s) Administered  . Influenza, High Dose Seasonal PF 03/04/2016, 02/21/2018, 02/23/2019  . Influenza, Seasonal, Injecte, Preservative Fre 05/03/2012, 05/05/2014, 02/28/2015  . Influenza,inj,Quad PF,6+ Mos 02/27/2013  . Influenza-Unspecified 03/17/2017  . PFIZER Comirnaty(Gray Top)Covid-19 Tri-Sucrose Vaccine 09/28/2020  . PFIZER(Purple Top)SARS-COV-2 Vaccination 08/25/2019, 09/19/2019  . Pneumococcal Conjugate-13 04/28/2015  . Pneumococcal Polysaccharide-23 10/30/2016  . Td 05/03/2012  . Zoster 06/29/2013    TDAP status: Up to date  Flu Vaccine status: due Fall 2022  Pneumococcal vaccine status: Up to date  Covid-19 vaccine status: Completed vaccines  Qualifies for Shingles Vaccine? Yes   Zostavax completed Yes   Shingrix Completed?: No.    Education has been provided regarding the importance of this vaccine. Patient has been advised to call insurance company to determine out of pocket expense if they have not yet received this vaccine. Advised may also receive vaccine at local pharmacy or Health Dept. Verbalized acceptance and understanding.  Screening Tests Health Maintenance  Topic Date Due  . INFLUENZA VACCINE  12/30/2020  .  TETANUS/TDAP  05/03/2022  . COLONOSCOPY (Pts 45-20yrs Insurance coverage will need to be confirmed)  06/07/2023  . COVID-19 Vaccine  Completed  . Hepatitis C Screening  Completed  . PNA vac Low Risk Adult  Completed  . HPV VACCINES  Aged Out    Health Maintenance  There are no preventive care reminders to display for this patient.  Colorectal cancer screening: Type of screening: Colonoscopy. Completed 06/06/2018. Repeat every 5 years  Lung Cancer Screening: (Low Dose CT Chest recommended if Age 89-80 years, 30 pack-year currently smoking OR have quit w/in 15years.) does not qualify.   Additional Screening:  Hepatitis C Screening: does qualify; Completed 10/29/2015  Vision Screening: Recommended annual ophthalmology exams for early detection of glaucoma and other disorders of the eye. Is the patient up to date with their annual eye exam?  {YES/NO:21197} Who is the provider or what is the name of the office in which the patient attends annual eye exams? *** If pt is not established with a provider, would they like to be referred to a provider to establish care? No .   Dental Screening: Recommended annual dental exams for proper oral hygiene  Community Resource Referral / Chronic Care Management: CRR required this visit?  No   CCM required this visit?  No      Plan:     I have personally reviewed and noted the  following in the patient's chart:   . Medical and social history . Use of alcohol, tobacco or illicit drugs  . Current medications and supplements including opioid prescriptions. Patient is not currently taking opioid prescriptions. . Functional ability and status . Nutritional status . Physical activity . Advanced directives . List of other physicians . Hospitalizations, surgeries, and ER visits in previous 12 months . Vitals . Screenings to include cognitive, depression, and falls . Referrals and appointments  In addition, I have reviewed and discussed with  patient certain preventive protocols, quality metrics, and best practice recommendations. A written personalized care plan for preventive services as well as general preventive health recommendations were provided to patient.   Due to this being a telephonic visit, the after visit summary with patients personalized plan was offered to patient via office or my-chart. Patient preferred to pick up at office at next visit or via mychart.   Andrez Grime, LPN   [id number]

## 2020-10-09 NOTE — Telephone Encounter (Signed)
Called patient 3 times trying to complete his AWV. Patient never answered. Left message on voicemail notifying patient I called and he can call to reschedule or provider might complete at his physical next week.

## 2020-10-11 ENCOUNTER — Other Ambulatory Visit (INDEPENDENT_AMBULATORY_CARE_PROVIDER_SITE_OTHER): Payer: Medicare Other

## 2020-10-11 ENCOUNTER — Other Ambulatory Visit: Payer: Self-pay

## 2020-10-11 DIAGNOSIS — I480 Paroxysmal atrial fibrillation: Secondary | ICD-10-CM

## 2020-10-11 DIAGNOSIS — E559 Vitamin D deficiency, unspecified: Secondary | ICD-10-CM | POA: Diagnosis not present

## 2020-10-11 DIAGNOSIS — E785 Hyperlipidemia, unspecified: Secondary | ICD-10-CM | POA: Diagnosis not present

## 2020-10-11 DIAGNOSIS — I10 Essential (primary) hypertension: Secondary | ICD-10-CM

## 2020-10-11 DIAGNOSIS — Z125 Encounter for screening for malignant neoplasm of prostate: Secondary | ICD-10-CM | POA: Diagnosis not present

## 2020-10-11 DIAGNOSIS — R7303 Prediabetes: Secondary | ICD-10-CM

## 2020-10-11 LAB — COMPREHENSIVE METABOLIC PANEL
ALT: 19 U/L (ref 0–53)
AST: 16 U/L (ref 0–37)
Albumin: 4.5 g/dL (ref 3.5–5.2)
Alkaline Phosphatase: 52 U/L (ref 39–117)
BUN: 21 mg/dL (ref 6–23)
CO2: 29 mEq/L (ref 19–32)
Calcium: 9.3 mg/dL (ref 8.4–10.5)
Chloride: 105 mEq/L (ref 96–112)
Creatinine, Ser: 0.65 mg/dL (ref 0.40–1.50)
GFR: 95.4 mL/min (ref >60.00)
Glucose, Bld: 121 mg/dL — ABNORMAL HIGH (ref 70–99)
Potassium: 4.4 mEq/L (ref 3.5–5.1)
Sodium: 143 mEq/L (ref 135–145)
Total Bilirubin: 0.5 mg/dL (ref 0.2–1.2)
Total Protein: 7.5 g/dL (ref 6.0–8.3)

## 2020-10-11 LAB — CBC WITH DIFFERENTIAL/PLATELET
Basophils Absolute: 0.1 10*3/uL (ref 0.0–0.1)
Basophils Relative: 0.7 % (ref 0.0–3.0)
Eosinophils Absolute: 0.3 10*3/uL (ref 0.0–0.7)
Eosinophils Relative: 3.7 % (ref 0.0–5.0)
HCT: 43 % (ref 39.0–52.0)
Hemoglobin: 14.8 g/dL (ref 13.0–17.0)
Lymphocytes Relative: 50.6 % — ABNORMAL HIGH (ref 12.0–46.0)
Lymphs Abs: 3.9 10*3/uL (ref 0.7–4.0)
MCHC: 34.4 g/dL (ref 30.0–36.0)
MCV: 96.7 fl (ref 78.0–100.0)
Monocytes Absolute: 0.6 10*3/uL (ref 0.1–1.0)
Monocytes Relative: 7.4 % (ref 3.0–12.0)
Neutro Abs: 2.9 10*3/uL (ref 1.4–7.7)
Neutrophils Relative %: 37.6 % — ABNORMAL LOW (ref 43.0–77.0)
Platelets: 238 10*3/uL (ref 150.0–400.0)
RBC: 4.45 Mil/uL (ref 4.22–5.81)
RDW: 13 % (ref 11.5–15.5)
WBC: 7.7 10*3/uL (ref 4.0–10.5)

## 2020-10-11 LAB — LIPID PANEL
Cholesterol: 120 mg/dL (ref 0–200)
HDL: 46.8 mg/dL (ref >39.00)
LDL Cholesterol: 50 mg/dL (ref 0–99)
NonHDL: 73.36
Total CHOL/HDL Ratio: 3
Triglycerides: 116 mg/dL (ref 0.0–149.0)
VLDL: 23.2 mg/dL (ref 0.0–40.0)

## 2020-10-11 LAB — MICROALBUMIN / CREATININE URINE RATIO
Creatinine,U: 125.2 mg/dL
Microalb Creat Ratio: 1.3 mg/g (ref 0.0–30.0)
Microalb, Ur: 1.7 mg/dL (ref 0.0–1.9)

## 2020-10-11 LAB — PSA: PSA: 1.38 ng/mL (ref 0.10–4.00)

## 2020-10-11 LAB — HEMOGLOBIN A1C: Hgb A1c MFr Bld: 5.8 % (ref 4.6–6.5)

## 2020-10-11 LAB — VITAMIN D 25 HYDROXY (VIT D DEFICIENCY, FRACTURES): VITD: 30.75 ng/mL (ref 30.00–100.00)

## 2020-10-18 ENCOUNTER — Other Ambulatory Visit: Payer: Self-pay

## 2020-10-18 ENCOUNTER — Ambulatory Visit (INDEPENDENT_AMBULATORY_CARE_PROVIDER_SITE_OTHER)
Admission: RE | Admit: 2020-10-18 | Discharge: 2020-10-18 | Disposition: A | Discharge status: Home / Self Care | Payer: Medicare Other | Source: Ambulatory Visit | Attending: Family Medicine | Admitting: Family Medicine

## 2020-10-18 ENCOUNTER — Encounter: Payer: Self-pay | Admitting: Family Medicine

## 2020-10-18 ENCOUNTER — Ambulatory Visit (INDEPENDENT_AMBULATORY_CARE_PROVIDER_SITE_OTHER): Payer: Medicare Other | Admitting: Family Medicine

## 2020-10-18 VITALS — BP 138/72 | HR 83 | Temp 98.1°F | Ht 69.75 in | Wt 220.4 lb

## 2020-10-18 DIAGNOSIS — I255 Ischemic cardiomyopathy: Secondary | ICD-10-CM

## 2020-10-18 DIAGNOSIS — Z0000 Encounter for general adult medical examination without abnormal findings: Secondary | ICD-10-CM

## 2020-10-18 DIAGNOSIS — E559 Vitamin D deficiency, unspecified: Secondary | ICD-10-CM | POA: Diagnosis not present

## 2020-10-18 DIAGNOSIS — Z7189 Other specified counseling: Secondary | ICD-10-CM

## 2020-10-18 DIAGNOSIS — K219 Gastro-esophageal reflux disease without esophagitis: Secondary | ICD-10-CM | POA: Diagnosis not present

## 2020-10-18 DIAGNOSIS — R0600 Dyspnea, unspecified: Secondary | ICD-10-CM | POA: Diagnosis not present

## 2020-10-18 DIAGNOSIS — N539 Unspecified male sexual dysfunction: Secondary | ICD-10-CM

## 2020-10-18 DIAGNOSIS — E785 Hyperlipidemia, unspecified: Secondary | ICD-10-CM

## 2020-10-18 DIAGNOSIS — J849 Interstitial pulmonary disease, unspecified: Secondary | ICD-10-CM

## 2020-10-18 DIAGNOSIS — E669 Obesity, unspecified: Secondary | ICD-10-CM

## 2020-10-18 DIAGNOSIS — I480 Paroxysmal atrial fibrillation: Secondary | ICD-10-CM | POA: Diagnosis not present

## 2020-10-18 DIAGNOSIS — R7303 Prediabetes: Secondary | ICD-10-CM

## 2020-10-18 DIAGNOSIS — R0602 Shortness of breath: Secondary | ICD-10-CM | POA: Diagnosis not present

## 2020-10-18 DIAGNOSIS — R918 Other nonspecific abnormal finding of lung field: Secondary | ICD-10-CM | POA: Diagnosis not present

## 2020-10-18 DIAGNOSIS — I10 Essential (primary) hypertension: Secondary | ICD-10-CM | POA: Diagnosis not present

## 2020-10-18 DIAGNOSIS — R21 Rash and other nonspecific skin eruption: Secondary | ICD-10-CM

## 2020-10-18 DIAGNOSIS — G14 Postpolio syndrome: Secondary | ICD-10-CM

## 2020-10-18 DIAGNOSIS — R0609 Other forms of dyspnea: Secondary | ICD-10-CM

## 2020-10-18 DIAGNOSIS — U071 COVID-19: Secondary | ICD-10-CM | POA: Diagnosis not present

## 2020-10-18 MED ORDER — GABAPENTIN 600 MG PO TABS
1200.0000 mg | ORAL_TABLET | Freq: Three times a day (TID) | ORAL | 3 refills | Status: DC
Start: 2020-10-18 — End: 2021-09-29

## 2020-10-18 MED ORDER — ROSUVASTATIN CALCIUM 40 MG PO TABS
40.0000 mg | ORAL_TABLET | Freq: Every day | ORAL | 3 refills | Status: DC
Start: 2020-10-18 — End: 2021-11-10

## 2020-10-18 MED ORDER — OMEPRAZOLE 40 MG PO CPDR
1.0000 | DELAYED_RELEASE_CAPSULE | Freq: Every day | ORAL | 3 refills | Status: DC
Start: 2020-10-18 — End: 2021-06-05

## 2020-10-18 MED ORDER — SILDENAFIL CITRATE 100 MG PO TABS
ORAL_TABLET | ORAL | 0 refills | Status: DC
Start: 2020-10-18 — End: 2023-08-19

## 2020-10-18 NOTE — Patient Instructions (Addendum)
If interested, check with pharmacy about new 2 shot shingles series (shingrix).  Chest xray today.  Good to see you today Return as needed or in 6 months for follow up visit. Call orthotic person to evaluate the leg braces.  Health Maintenance After Age 71 After age 76, you are at a higher risk for certain long-term diseases and infections as well as injuries from falls. Falls are a major cause of broken bones and head injuries in people who are older than age 51. Getting regular preventive care can help to keep you healthy and well. Preventive care includes getting regular testing and making lifestyle changes as recommended by your health care provider. Talk with your health care provider about:  Which screenings and tests you should have. A screening is a test that checks for a disease when you have no symptoms.  A diet and exercise plan that is right for you. What should I know about screenings and tests to prevent falls? Screening and testing are the best ways to find a health problem early. Early diagnosis and treatment give you the best chance of managing medical conditions that are common after age 32. Certain conditions and lifestyle choices may make you more likely to have a fall. Your health care provider may recommend:  Regular vision checks. Poor vision and conditions such as cataracts can make you more likely to have a fall. If you wear glasses, make sure to get your prescription updated if your vision changes.  Medicine review. Work with your health care provider to regularly review all of the medicines you are taking, including over-the-counter medicines. Ask your health care provider about any side effects that may make you more likely to have a fall. Tell your health care provider if any medicines that you take make you feel dizzy or sleepy.  Osteoporosis screening. Osteoporosis is a condition that causes the bones to get weaker. This can make the bones weak and cause them to  break more easily.  Blood pressure screening. Blood pressure changes and medicines to control blood pressure can make you feel dizzy.  Strength and balance checks. Your health care provider may recommend certain tests to check your strength and balance while standing, walking, or changing positions.  Foot health exam. Foot pain and numbness, as well as not wearing proper footwear, can make you more likely to have a fall.  Depression screening. You may be more likely to have a fall if you have a fear of falling, feel emotionally low, or feel unable to do activities that you used to do.  Alcohol use screening. Using too much alcohol can affect your balance and may make you more likely to have a fall. What actions can I take to lower my risk of falls? General instructions  Talk with your health care provider about your risks for falling. Tell your health care provider if: ? You fall. Be sure to tell your health care provider about all falls, even ones that seem minor. ? You feel dizzy, sleepy, or off-balance.  Take over-the-counter and prescription medicines only as told by your health care provider. These include any supplements.  Eat a healthy diet and maintain a healthy weight. A healthy diet includes low-fat dairy products, low-fat (lean) meats, and fiber from whole grains, beans, and lots of fruits and vegetables. Home safety  Remove any tripping hazards, such as rugs, cords, and clutter.  Install safety equipment such as grab bars in bathrooms and safety rails on stairs.  Keep  rooms and walkways well-lit. Activity  Follow a regular exercise program to stay fit. This will help you maintain your balance. Ask your health care provider what types of exercise are appropriate for you.  If you need a cane or walker, use it as recommended by your health care provider.  Wear supportive shoes that have nonskid soles.   Lifestyle  Do not drink alcohol if your health care provider tells  you not to drink.  If you drink alcohol, limit how much you have: ? 0-1 drink a day for women. ? 0-2 drinks a day for men.  Be aware of how much alcohol is in your drink. In the [place] one drink equals one typical bottle of beer (12 oz), one-half glass of wine (5 oz), or one shot of hard liquor (1 oz).  Do not use any products that contain nicotine or tobacco, such as cigarettes and e-cigarettes. If you need help quitting, ask your health care provider. Summary  Having a healthy lifestyle and getting preventive care can help to protect your health and wellness after age 9.  Screening and testing are the best way to find a health problem early and help you avoid having a fall. Early diagnosis and treatment give you the best chance for managing medical conditions that are more common for people who are older than age 26.  Falls are a major cause of broken bones and head injuries in people who are older than age 41. Take precautions to prevent a fall at home.  Work with your health care provider to learn what changes you can make to improve your health and wellness and to prevent falls. This information is not intended to replace advice given to you by your health care provider. Make sure you discuss any questions you have with your health care provider. Document Revised: 09/08/2018 Document Reviewed: 03/31/2017 Elsevier Patient Education  2021 [employer].

## 2020-10-18 NOTE — Progress Notes (Signed)
Patient ID: Alexander [name], male    DOB: 01-Aug-1950, 70 y.o.   MRN: [id number]  This visit was conducted in person.  BP 138/72   Pulse 83   Temp 98.1 F (36.7 C) (Temporal)   Ht 5' 9.75" (1.772 m)   Wt 220 lb 6 oz (100 kg)   SpO2 94%   BMI 31.85 kg/m    CC: AMW  Subjective:   HPI: Alexander [name] is a 70 y.o. male presenting on 10/18/2020 for Medicare Wellness (Pt accompanied by wife, Robyn- temp 98.0.)   Did not see health advisor this year.    Hearing Screening   125Hz  250Hz  500Hz  1000Hz  2000Hz  3000Hz  4000Hz  6000Hz  8000Hz   Right ear:   20 20 20   40    Left ear:   20 20 20  20     Vision Screening Comments: Last eye exam, 09/2020.  [place] Office Visit from 10/18/2020 in [facility] [name] at [place]  PHQ-2 Total Score 0      Fall Risk  10/18/2020 11/24/2018 11/04/2017 12/21/2016 10/30/2016  Falls in the past year? 0 0 Yes No Yes  Comment - - - Emmi Telephone Survey: data to providers prior to load due to pt's health status, he has multiple falls  Number falls in past yr: - - 1 - 2 or more  Injury with Fall? 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 Notes bruising to R inner ankle possibly from brace (new since 06/2019).   Got COVID 06/2020, got pretty sick but symptoms fully recovered. Since COVID wife notes he stays short-winded - predominantly with exertion. Saw cardiology with reassuring echocardiogram.   Preventative: COLONOSCOPY WITH ESOPHAGOGASTRODUODENOSCOPY (EGD) 06/2018 - reactive gastropathy, tubular adenoma, rpt colonoscopy 5 yrs (Alexander [name]) Prostate cancer screening - desires continued  screening. Nocturia x2-3. Lung cancer screening - not eligible  Flu shotyearly COVID vaccine Pfizer x2, booster 03/2020, 08/2020 Td 2013 Prevnar 2016, pnuemovax 2018. zostavax - 06/2013 shingrix - discussed.to check with pharmacy.  Advanced directives: wife working on setting this up at home. Wife would be HCPOA. Does not want prolonged life support.Wants to be cremated. Seat belt use discussed Sunscreen use discussed. No changing moles on skin.  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 Interim medical history since our last visit reviewed. Allergies and medications reviewed and updated. Outpatient Medications Prior to Visit  Medication Sig Dispense Refill  . acetaminophen (TYLENOL) 500 MG tablet Take 1,000 mg by mouth every 6 (six) hours as needed for moderate pain or headache.     . Ascorbic  Acid (VITAMIN C) 1000 MG tablet Take 1,000 mg by mouth daily.    . Carboxymethylcellulose Sodium (LUBRICANT EYE DROPS OP) Place 2 drops into both eyes daily as needed (for dry eyes).    . Cholecalciferol (VITAMIN D3) 50 MCG (2000 UT) TABS Take 2,000 Units by mouth daily.    . Magnesium 250 MG TABS Take 1 tablet (250 mg total) by mouth at bedtime. (Patient taking differently: Take 250 mg by mouth at bedtime.)  0  . metoprolol tartrate (LOPRESSOR) 25 MG tablet TAKE 12.5 MG ( 1/2 TABLET) TWICE A DAY . MAY TAKE AN ADDITIONAL 25 MG IF NEEDED FOR PALPS. 120 tablet 3  . Multiple Vitamin  (MULTIVITAMIN WITH MINERALS) TABS tablet Take 1 tablet by mouth daily.    . Omega-3 Fatty Acids (FISH OIL) 1000 MG CAPS Take 1,000 mg by mouth daily.     . vitamin B-12 (CYANOCOBALAMIN) 1000 MCG tablet Take 1,000 mcg by mouth daily.    . vitamin E 400 UNIT capsule Take 400 Units by mouth daily.    Marland Kitchen gabapentin (NEURONTIN) 600 MG tablet TAKE 2 TABLETS BY MOUTH 3  TIMES A DAY 540 tablet 3  . omeprazole (PRILOSEC) 40 MG capsule TAKE 1 CAPSULE BY MOUTH  DAILY 90 capsule 0  . rosuvastatin (CRESTOR) 40 MG tablet TAKE 1 TABLET BY MOUTH  DAILY 90 tablet 3  . sildenafil (VIAGRA) 100 MG tablet TAKE 1/2 TO 1 TABLET BY MOUTH DAILY AS NEEDED FOR ERECTILE DYSFUNCTION 6 tablet 0  . cetirizine (ZYRTEC) 10 MG tablet Take 1 tablet (10 mg total) by mouth daily. 30 tablet 0   No facility-administered medications prior to visit.     Per HPI unless specifically indicated in ROS section below Review of Systems Objective:  BP 138/72   Pulse 83   Temp 98.1 F (36.7 C) (Temporal)   Ht 5' 9.75" (1.772 m)   Wt 220 lb 6 oz (100 kg)   SpO2 94%   BMI 31.85 kg/m   Wt Readings from Last 3 Encounters:  10/18/20 220 lb 6 oz (100 kg)  07/26/20 224 lb (101.6 kg)  07/18/19 224 lb (101.6 kg)      Physical Exam Vitals and nursing note reviewed.  Constitutional:      General: He is not in acute distress.    Appearance: Normal appearance. He is well-developed. He is not ill-appearing.     Comments: Uses arm and custom leg braces  HENT:     Head: Normocephalic and atraumatic.     Right Ear: Hearing, tympanic membrane, ear canal and external ear normal.     Left Ear: Hearing, tympanic membrane, ear canal and external ear normal.     Mouth/Throat:     Pharynx: Uvula midline.  Eyes:     General: No scleral icterus.    Extraocular Movements: Extraocular movements intact.     Conjunctiva/sclera: Conjunctivae normal.     Pupils: Pupils are equal, round, and reactive to light.  Cardiovascular:     Rate and Rhythm:  Normal rate and regular rhythm.     Pulses: Normal pulses.          Radial pulses are 2+ on the right side and 2+ on the left side.     Heart sounds: Normal heart sounds. No murmur heard.   Pulmonary:     Effort: Pulmonary effort is normal. No respiratory distress.     Breath sounds: Normal breath sounds. No wheezing, rhonchi or rales.  Abdominal:  General: Bowel sounds are normal. There is no distension.     Palpations: Abdomen is soft. There is no mass.     Tenderness: There is no abdominal tenderness. There is no guarding or rebound.     Hernia: No hernia is present.  Musculoskeletal:        General: Normal range of motion.     Cervical back: Normal range of motion and neck supple.     Right lower leg: No edema.     Left lower leg: No edema.  Lymphadenopathy:     Cervical: No cervical adenopathy.  Skin:    General: Skin is warm and dry.     Findings: Rash present. No erythema.     Comments: Petechial nonblanching rash to R medial ankle posterior to malleolus with central thickened scaly area, itchy  Neurological:     General: No focal deficit present.     Mental Status: He is alert and oriented to person, place, and time.     Comments:  CN grossly intact, station and gait intact Recall 3/3  Calculation 5/5 DLROW  Psychiatric:        Mood and Affect: Mood normal.        Behavior: Behavior normal.        Thought Content: Thought content normal.        Judgment: Judgment normal.       Results for orders placed or performed in visit on 10/11/20  Hemoglobin A1c  Result Value Ref Range   Hgb A1c MFr Bld 5.8 4.6 - 6.5 %  PSA  Result Value Ref Range   PSA 1.38 0.10 - 4.00 ng/mL  CBC with Differential/Platelet  Result Value Ref Range   WBC 7.7 4.0 - 10.5 K/uL   RBC 4.45 4.22 - 5.81 Mil/uL   Hemoglobin 14.8 13.0 - 17.0 g/dL   HCT 43.0 39.0 - 52.0 %   MCV 96.7 78.0 - 100.0 fl   MCHC 34.4 30.0 - 36.0 g/dL   RDW 13.0 11.5 - 15.5 %   Platelets 238.0 150.0 - 400.0 K/uL    Neutrophils Relative % 37.6 (L) 43.0 - 77.0 %   Lymphocytes Relative 50.6 (H) 12.0 - 46.0 %   Monocytes Relative 7.4 3.0 - 12.0 %   Eosinophils Relative 3.7 0.0 - 5.0 %   Basophils Relative 0.7 0.0 - 3.0 %   Neutro Abs 2.9 1.4 - 7.7 K/uL   Lymphs Abs 3.9 0.7 - 4.0 K/uL   Monocytes Absolute 0.6 0.1 - 1.0 K/uL   Eosinophils Absolute 0.3 0.0 - 0.7 K/uL   Basophils Absolute 0.1 0.0 - 0.1 K/uL  Comprehensive metabolic panel  Result Value Ref Range   Sodium 143 135 - 145 mEq/L   Potassium 4.4 3.5 - 5.1 mEq/L   Chloride 105 96 - 112 mEq/L   CO2 29 19 - 32 mEq/L   Glucose, Bld 121 (H) 70 - 99 mg/dL   BUN 21 6 - 23 mg/dL   Creatinine, Ser 0.65 0.40 - 1.50 mg/dL   Total Bilirubin 0.5 0.2 - 1.2 mg/dL   Alkaline Phosphatase 52 39 - 117 U/L   AST 16 0 - 37 U/L   ALT 19 0 - 53 U/L   Total Protein 7.5 6.0 - 8.3 g/dL   Albumin 4.5 3.5 - 5.2 g/dL   GFR 95.40 >60.00 mL/min   Calcium 9.3 8.4 - 10.5 mg/dL  Lipid panel  Result Value Ref Range   Cholesterol 120 0 - 200 mg/dL  Triglycerides 116.0 0.0 - 149.0 mg/dL   HDL 46.80 >39.00 mg/dL   VLDL 23.2 0.0 - 40.0 mg/dL   LDL Cholesterol 50 0 - 99 mg/dL   Total CHOL/HDL Ratio 3    NonHDL 73.36   VITAMIN D 25 Hydroxy (Vit-D Deficiency, Fractures)  Result Value Ref Range   VITD 30.75 30.00 - 100.00 ng/mL  Microalbumin / creatinine urine ratio  Result Value Ref Range   Microalb, Ur 1.7 0.0 - 1.9 mg/dL   Creatinine,U 125.2 mg/dL   Microalb Creat Ratio 1.3 0.0 - 30.0 mg/g   Assessment & Plan:  This visit occurred during the SARS-CoV-2 public health emergency.  Safety protocols were in place, including screening questions prior to the visit, additional usage of staff PPE, and extensive cleaning of exam room while observing appropriate contact time as indicated for disinfecting solutions.   Problem List Items Addressed This Visit    Hyperlipidemia with target LDL less than 70 (Chronic)    Chronic, well controlled on full dose crestor -  continue. The ASCVD Risk score Mikey Bussing DC Jr., et al., 2013) failed to calculate for the following reasons:   The patient has a prior MI or stroke diagnosis       Relevant Medications   sildenafil (VIAGRA) 100 MG tablet   rosuvastatin (CRESTOR) 40 MG tablet   Essential hypertension (Chronic)    Chronic, stable. Continue current regimen of metoprolol 25mg  bid.       Relevant Medications   sildenafil (VIAGRA) 100 MG tablet   rosuvastatin (CRESTOR) 40 MG tablet   Ischemic cardiomyopathy (Chronic)   Relevant Medications   sildenafil (VIAGRA) 100 MG tablet   rosuvastatin (CRESTOR) 40 MG tablet   PAF (paroxysmal atrial fibrillation) ([place]); CHA2DS2VASc = 4--> Eliquis. (Chronic)    Isolated episode during acute illness/hospitalization 2016, off AC since 2018       Relevant Medications   sildenafil (VIAGRA) 100 MG tablet   rosuvastatin (CRESTOR) 40 MG tablet   Interstitial lung disease (HCC) (Chronic)    Again noted on CT scan 2019 Update CXR today, consider pulm referral.  Recent echo reassuring.       Post-polio syndrome    Notes progressive leg weakness/atrophy over the years.  Monitor for post-polio syndrome vs residual weakness after polio.       Male sexual dysfunction    Continue viagra PRN      Prediabetes    Reviewed limiting added sugars in diet       Vitamin D deficiency    Vit D levels have decreased but remain adequate - continue vit D 2000 daily.       Medicare annual wellness visit, subsequent - Primary    I have personally reviewed the Medicare Annual Wellness questionnaire and have noted 1. [name] medical and social history 2. Their use of alcohol, tobacco or illicit drugs 3. Their current medications and supplements 4. [name] functional ability including ADL's, fall risks, home safety risks and hearing or visual impairment. Cognitive function has been assessed and addressed as indicated.  5. Diet and physical activity 6. Evidence for  depression or mood disorders The patients weight, height, BMI have been recorded in the chart. I have made referrals, counseling and provided education to the patient based on review of the above and I have provided the pt with a written personalized care plan for preventive services. Provider list updated.. See scanned questionairre as needed for further documentation. Reviewed preventative protocols and updated unless pt declined.  Advanced care planning/counseling discussion    Advanced directives: wife working on setting this up at home. Wife would be HCPOA. Does not want prolonged life support.      Obesity, Class I, BMI 30-34.9    Encourage healthy diet and lifestyle       GERD (gastroesophageal reflux disease)    Continue omeprazole 40mg  daily.       Relevant Medications   omeprazole (PRILOSEC) 40 MG capsule   Health maintenance examination    Preventative protocols reviewed and updated unless pt declined. Discussed healthy diet and lifestyle.       Dyspnea on exertion    ?post-COVID sequelae - check baseline CXR today. No significant wheezing on exam.  No known h/o asthma/COPD. ?ILD - see above.       Relevant Orders   DG Chest 2 View   Skin rash    Anticipate irritant rash from brace rubbing onto skin of medial ankle.  Recommend re-fitting of brace, start moisturizing cream use daily, update if not improving with this, would consider steroid course for coarse thickened itchy skin.           Meds ordered this encounter  Medications  . sildenafil (VIAGRA) 100 MG tablet    Sig: TAKE 1/2 TO 1 TABLET BY MOUTH DAILY AS NEEDED FOR ERECTILE DYSFUNCTION    Dispense:  6 tablet    Refill:  0  . gabapentin (NEURONTIN) 600 MG tablet    Sig: Take 2 tablets (1,200 mg total) by mouth 3 (three) times daily.    Dispense:  540 tablet    Refill:  3  . omeprazole (PRILOSEC) 40 MG capsule    Sig: Take 1 capsule (40 mg total) by mouth daily.    Dispense:  90 capsule     Refill:  3  . rosuvastatin (CRESTOR) 40 MG tablet    Sig: Take 1 tablet (40 mg total) by mouth daily.    Dispense:  90 tablet    Refill:  3   Orders Placed This Encounter  Procedures  . DG Chest 2 View    Standing Status:   Future    Number of Occurrences:   1    Standing Expiration Date:   10/18/2021    Order Specific Question:   Reason for Exam (SYMPTOM  OR DIAGNOSIS REQUIRED)    Answer:   dyspnea after covid infection 06/2020    Order Specific Question:   Preferred imaging location?    Answer:   [facility]    Patient instructions: If interested, check with pharmacy about new 2 shot shingles series (shingrix).  Chest xray today.  Good to see you today Return as needed or in 6 months for follow up visit. Call orthotic person to evaluate the leg braces  Follow up plan: Return in about 6 months (around 04/20/2021) for follow up visit.  [name], MD

## 2020-10-19 ENCOUNTER — Encounter: Payer: Self-pay | Admitting: Family Medicine

## 2020-10-19 DIAGNOSIS — R21 Rash and other nonspecific skin eruption: Secondary | ICD-10-CM | POA: Insufficient documentation

## 2020-10-19 NOTE — Assessment & Plan Note (Signed)
Chronic, stable. Continue current regimen of metoprolol 25mg  bid.

## 2020-10-19 NOTE — Assessment & Plan Note (Signed)
Continue omeprazole 40 mg daily

## 2020-10-19 NOTE — Assessment & Plan Note (Addendum)
Anticipate irritant rash from brace rubbing onto skin of medial ankle.  Recommend re-fitting of brace, start moisturizing cream use daily, update if not improving with this, would consider steroid course for coarse thickened itchy skin.

## 2020-10-19 NOTE — Assessment & Plan Note (Signed)
Encourage healthy diet and lifestyle.  

## 2020-10-19 NOTE — Assessment & Plan Note (Signed)
Isolated episode during acute illness/hospitalization 2016, off AC since 2018

## 2020-10-19 NOTE — Assessment & Plan Note (Signed)
Advanced directives: wife working on setting this up at home. Wife would be HCPOA. Does not want prolonged life support.

## 2020-10-19 NOTE — Assessment & Plan Note (Signed)
Reviewed limiting added sugars in diet.  

## 2020-10-19 NOTE — Assessment & Plan Note (Signed)
Chronic, well controlled on full dose crestor - continue. The ASCVD Risk score Mikey Bussing DC Jr., et al., 2013) failed to calculate for the following reasons:   The patient has a prior MI or stroke diagnosis

## 2020-10-19 NOTE — Assessment & Plan Note (Signed)
Preventative protocols reviewed and updated unless pt declined. Discussed healthy diet and lifestyle.  

## 2020-10-19 NOTE — Assessment & Plan Note (Signed)

## 2020-10-19 NOTE — Assessment & Plan Note (Addendum)
Notes progressive leg weakness/atrophy over the years.  Monitor for post-polio syndrome vs residual weakness after polio.

## 2020-10-19 NOTE — Assessment & Plan Note (Signed)
Again noted on CT scan 2019 Update CXR today, consider pulm referral.  Recent echo reassuring.

## 2020-10-19 NOTE — Assessment & Plan Note (Signed)
Vit D levels have decreased but remain adequate - continue vit D 2000 daily.

## 2020-10-19 NOTE — Assessment & Plan Note (Addendum)
?  post-COVID sequelae - check baseline CXR today. No significant wheezing on exam.  No known h/o asthma/COPD. ?ILD - see above.

## 2020-10-19 NOTE — Assessment & Plan Note (Signed)
Continue viagra PRN

## 2020-10-21 ENCOUNTER — Encounter: Payer: Self-pay | Admitting: Family Medicine

## 2020-10-24 NOTE — Telephone Encounter (Signed)
Replied via result note.

## 2021-03-31 ENCOUNTER — Emergency Department (HOSPITAL_COMMUNITY): Payer: Medicare Other

## 2021-03-31 ENCOUNTER — Inpatient Hospital Stay (HOSPITAL_COMMUNITY): Payer: Medicare Other

## 2021-03-31 ENCOUNTER — Telehealth: Payer: Self-pay | Admitting: Gastroenterology

## 2021-03-31 ENCOUNTER — Inpatient Hospital Stay (HOSPITAL_COMMUNITY)
Admission: EM | Admit: 2021-03-31 | Discharge: 2021-04-03 | DRG: 445 | Disposition: A | Discharge status: Home / Self Care | Payer: Medicare Other | Attending: Internal Medicine | Admitting: Internal Medicine

## 2021-03-31 DIAGNOSIS — I214 Non-ST elevation (NSTEMI) myocardial infarction: Secondary | ICD-10-CM | POA: Diagnosis not present

## 2021-03-31 DIAGNOSIS — G4733 Obstructive sleep apnea (adult) (pediatric): Secondary | ICD-10-CM | POA: Diagnosis not present

## 2021-03-31 DIAGNOSIS — R7401 Elevation of levels of liver transaminase levels: Secondary | ICD-10-CM | POA: Diagnosis not present

## 2021-03-31 DIAGNOSIS — Z955 Presence of coronary angioplasty implant and graft: Secondary | ICD-10-CM | POA: Diagnosis not present

## 2021-03-31 DIAGNOSIS — R109 Unspecified abdominal pain: Secondary | ICD-10-CM | POA: Diagnosis not present

## 2021-03-31 DIAGNOSIS — Z803 Family history of malignant neoplasm of breast: Secondary | ICD-10-CM

## 2021-03-31 DIAGNOSIS — E785 Hyperlipidemia, unspecified: Secondary | ICD-10-CM | POA: Diagnosis present

## 2021-03-31 DIAGNOSIS — K319 Disease of stomach and duodenum, unspecified: Secondary | ICD-10-CM | POA: Diagnosis not present

## 2021-03-31 DIAGNOSIS — I252 Old myocardial infarction: Secondary | ICD-10-CM

## 2021-03-31 DIAGNOSIS — Z6829 Body mass index (BMI) 29.0-29.9, adult: Secondary | ICD-10-CM

## 2021-03-31 DIAGNOSIS — Z8249 Family history of ischemic heart disease and other diseases of the circulatory system: Secondary | ICD-10-CM

## 2021-03-31 DIAGNOSIS — D696 Thrombocytopenia, unspecified: Secondary | ICD-10-CM | POA: Diagnosis not present

## 2021-03-31 DIAGNOSIS — M1990 Unspecified osteoarthritis, unspecified site: Secondary | ICD-10-CM | POA: Diagnosis not present

## 2021-03-31 DIAGNOSIS — I480 Paroxysmal atrial fibrillation: Secondary | ICD-10-CM | POA: Diagnosis present

## 2021-03-31 DIAGNOSIS — K9189 Other postprocedural complications and disorders of digestive system: Secondary | ICD-10-CM | POA: Diagnosis present

## 2021-03-31 DIAGNOSIS — K315 Obstruction of duodenum: Secondary | ICD-10-CM | POA: Diagnosis not present

## 2021-03-31 DIAGNOSIS — Z743 Need for continuous supervision: Secondary | ICD-10-CM | POA: Diagnosis not present

## 2021-03-31 DIAGNOSIS — E278 Other specified disorders of adrenal gland: Secondary | ICD-10-CM | POA: Diagnosis not present

## 2021-03-31 DIAGNOSIS — Z79899 Other long term (current) drug therapy: Secondary | ICD-10-CM

## 2021-03-31 DIAGNOSIS — K819 Cholecystitis, unspecified: Secondary | ICD-10-CM

## 2021-03-31 DIAGNOSIS — Z9049 Acquired absence of other specified parts of digestive tract: Secondary | ICD-10-CM

## 2021-03-31 DIAGNOSIS — K831 Obstruction of bile duct: Secondary | ICD-10-CM

## 2021-03-31 DIAGNOSIS — E559 Vitamin D deficiency, unspecified: Secondary | ICD-10-CM | POA: Diagnosis not present

## 2021-03-31 DIAGNOSIS — E669 Obesity, unspecified: Secondary | ICD-10-CM | POA: Diagnosis present

## 2021-03-31 DIAGNOSIS — R0602 Shortness of breath: Secondary | ICD-10-CM | POA: Diagnosis not present

## 2021-03-31 DIAGNOSIS — I255 Ischemic cardiomyopathy: Secondary | ICD-10-CM | POA: Diagnosis not present

## 2021-03-31 DIAGNOSIS — K8309 Other cholangitis: Secondary | ICD-10-CM

## 2021-03-31 DIAGNOSIS — D6489 Other specified anemias: Secondary | ICD-10-CM | POA: Diagnosis not present

## 2021-03-31 DIAGNOSIS — J849 Interstitial pulmonary disease, unspecified: Secondary | ICD-10-CM | POA: Diagnosis present

## 2021-03-31 DIAGNOSIS — K7689 Other specified diseases of liver: Secondary | ICD-10-CM | POA: Diagnosis not present

## 2021-03-31 DIAGNOSIS — R6889 Other general symptoms and signs: Secondary | ICD-10-CM | POA: Diagnosis not present

## 2021-03-31 DIAGNOSIS — K8050 Calculus of bile duct without cholangitis or cholecystitis without obstruction: Secondary | ICD-10-CM | POA: Diagnosis not present

## 2021-03-31 DIAGNOSIS — Z87891 Personal history of nicotine dependence: Secondary | ICD-10-CM

## 2021-03-31 DIAGNOSIS — Z86010 Personal history of colonic polyps: Secondary | ICD-10-CM

## 2021-03-31 DIAGNOSIS — B91 Sequelae of poliomyelitis: Secondary | ICD-10-CM

## 2021-03-31 DIAGNOSIS — K760 Fatty (change of) liver, not elsewhere classified: Secondary | ICD-10-CM | POA: Diagnosis not present

## 2021-03-31 DIAGNOSIS — Z20822 Contact with and (suspected) exposure to covid-19: Secondary | ICD-10-CM | POA: Diagnosis present

## 2021-03-31 DIAGNOSIS — K8033 Calculus of bile duct with acute cholangitis with obstruction: Principal | ICD-10-CM | POA: Diagnosis present

## 2021-03-31 DIAGNOSIS — K219 Gastro-esophageal reflux disease without esophagitis: Secondary | ICD-10-CM | POA: Diagnosis not present

## 2021-03-31 DIAGNOSIS — G14 Postpolio syndrome: Secondary | ICD-10-CM | POA: Diagnosis present

## 2021-03-31 DIAGNOSIS — I2510 Atherosclerotic heart disease of native coronary artery without angina pectoris: Secondary | ICD-10-CM

## 2021-03-31 DIAGNOSIS — N281 Cyst of kidney, acquired: Secondary | ICD-10-CM | POA: Diagnosis not present

## 2021-03-31 DIAGNOSIS — K8030 Calculus of bile duct with cholangitis, unspecified, without obstruction: Secondary | ICD-10-CM | POA: Diagnosis not present

## 2021-03-31 DIAGNOSIS — K7290 Hepatic failure, unspecified without coma: Secondary | ICD-10-CM | POA: Diagnosis not present

## 2021-03-31 DIAGNOSIS — R509 Fever, unspecified: Secondary | ICD-10-CM | POA: Diagnosis present

## 2021-03-31 DIAGNOSIS — Z9861 Coronary angioplasty status: Secondary | ICD-10-CM

## 2021-03-31 DIAGNOSIS — I10 Essential (primary) hypertension: Secondary | ICD-10-CM | POA: Diagnosis present

## 2021-03-31 DIAGNOSIS — M5136 Other intervertebral disc degeneration, lumbar region: Secondary | ICD-10-CM | POA: Diagnosis not present

## 2021-03-31 DIAGNOSIS — R0689 Other abnormalities of breathing: Secondary | ICD-10-CM | POA: Diagnosis not present

## 2021-03-31 DIAGNOSIS — Z833 Family history of diabetes mellitus: Secondary | ICD-10-CM

## 2021-03-31 DIAGNOSIS — M4317 Spondylolisthesis, lumbosacral region: Secondary | ICD-10-CM | POA: Diagnosis not present

## 2021-03-31 LAB — CBC WITH DIFFERENTIAL/PLATELET
Abs Immature Granulocytes: 0.11 10*3/uL — ABNORMAL HIGH (ref 0.00–0.07)
Basophils Absolute: 0 10*3/uL (ref 0.0–0.1)
Basophils Relative: 0 %
Eosinophils Absolute: 0 10*3/uL (ref 0.0–0.5)
Eosinophils Relative: 0 %
HCT: 42.8 % (ref 39.0–52.0)
Hemoglobin: 14.7 g/dL (ref 13.0–17.0)
Immature Granulocytes: 1 %
Lymphocytes Relative: 4 %
Lymphs Abs: 0.6 10*3/uL — ABNORMAL LOW (ref 0.7–4.0)
MCH: 32.7 pg (ref 26.0–34.0)
MCHC: 34.3 g/dL (ref 30.0–36.0)
MCV: 95.3 fL (ref 80.0–100.0)
Monocytes Absolute: 0.4 10*3/uL (ref 0.1–1.0)
Monocytes Relative: 3 %
Neutro Abs: 14.8 10*3/uL — ABNORMAL HIGH (ref 1.7–7.7)
Neutrophils Relative %: 92 %
Platelets: 231 10*3/uL (ref 150–400)
RBC: 4.49 MIL/uL (ref 4.22–5.81)
RDW: 12.9 % (ref 11.5–15.5)
WBC: 15.9 10*3/uL — ABNORMAL HIGH (ref 4.0–10.5)
nRBC: 0 % (ref 0.0–0.2)

## 2021-03-31 LAB — HEPATITIS PANEL, ACUTE
HCV Ab: NONREACTIVE
Hep A IgM: NONREACTIVE
Hep B C IgM: NONREACTIVE
Hepatitis B Surface Ag: NONREACTIVE

## 2021-03-31 LAB — LACTIC ACID, PLASMA
Lactic Acid, Venous: 2.1 mmol/L (ref 0.5–1.9)
Lactic Acid, Venous: 2.1 mmol/L (ref 0.5–1.9)

## 2021-03-31 LAB — LIPASE, BLOOD: Lipase: 22 U/L (ref 11–51)

## 2021-03-31 LAB — COMPREHENSIVE METABOLIC PANEL
ALT: 367 U/L — ABNORMAL HIGH (ref 0–44)
AST: 236 U/L — ABNORMAL HIGH (ref 15–41)
Albumin: 3.9 g/dL (ref 3.5–5.0)
Alkaline Phosphatase: 158 U/L — ABNORMAL HIGH (ref 38–126)
Anion gap: 14 (ref 5–15)
BUN: 17 mg/dL (ref 8–23)
CO2: 19 mmol/L — ABNORMAL LOW (ref 22–32)
Calcium: 9.5 mg/dL (ref 8.9–10.3)
Chloride: 103 mmol/L (ref 98–111)
Creatinine, Ser: 0.83 mg/dL (ref 0.61–1.24)
GFR, Estimated: >60 mL/min (ref >60)
Glucose, Bld: 94 mg/dL (ref 70–99)
Potassium: 3.7 mmol/L (ref 3.5–5.1)
Sodium: 136 mmol/L (ref 135–145)
Total Bilirubin: 5.8 mg/dL — ABNORMAL HIGH (ref 0.3–1.2)
Total Protein: 7.5 g/dL (ref 6.5–8.1)

## 2021-03-31 LAB — TROPONIN I (HIGH SENSITIVITY)
Troponin I (High Sensitivity): 20 ng/L — ABNORMAL HIGH (ref <18)
Troponin I (High Sensitivity): 13 ng/L (ref <18)

## 2021-03-31 LAB — RESP PANEL BY RT-PCR (FLU A&B, COVID) ARPGX2
Influenza A by PCR: NEGATIVE
Influenza B by PCR: NEGATIVE
SARS Coronavirus 2 by RT PCR: NEGATIVE

## 2021-03-31 MED ORDER — LORAZEPAM 2 MG/ML IJ SOLN: 1.0000 mg | Freq: Once | INTRAMUSCULAR | Status: AC

## 2021-03-31 MED ORDER — GABAPENTIN 600 MG PO TABS
1200.0000 mg | ORAL_TABLET | Freq: Three times a day (TID) | ORAL | Status: DC
  Administered 2021-03-31 – 2021-04-03 (×8): 1200 mg via ORAL
  Filled 2021-03-31 (×9): qty 2

## 2021-03-31 MED ORDER — PANTOPRAZOLE SODIUM 40 MG PO TBEC
40.0000 mg | DELAYED_RELEASE_TABLET | Freq: Every day | ORAL | Status: DC
  Administered 2021-03-31 – 2021-04-03 (×4): 40 mg via ORAL
  Filled 2021-03-31 (×4): qty 1

## 2021-03-31 MED ORDER — SODIUM CHLORIDE 0.9 % IV SOLN: INTRAVENOUS | Status: DC

## 2021-03-31 MED ORDER — ENOXAPARIN SODIUM 40 MG/0.4ML IJ SOSY
40.0000 mg | PREFILLED_SYRINGE | INTRAMUSCULAR | Status: DC
  Administered 2021-03-31 – 2021-04-02 (×3): 40 mg via SUBCUTANEOUS
  Filled 2021-03-31 (×3): qty 0.4

## 2021-03-31 MED ORDER — FENTANYL CITRATE PF 50 MCG/ML IJ SOSY
50.0000 ug | PREFILLED_SYRINGE | Freq: Once | INTRAMUSCULAR | Status: AC
Start: 2021-03-31 — End: 2021-03-31
  Administered 2021-03-31: 50 ug via INTRAVENOUS
  Filled 2021-03-31: qty 1

## 2021-03-31 MED ORDER — HYDROCODONE-ACETAMINOPHEN 5-325 MG PO TABS: 1.0000 | ORAL_TABLET | Freq: Once | ORAL | Status: DC

## 2021-03-31 MED ORDER — PIPERACILLIN-TAZOBACTAM 3.375 G IVPB
3.3750 g | Freq: Three times a day (TID) | INTRAVENOUS | Status: DC
  Administered 2021-04-01 – 2021-04-03 (×6): 3.375 g via INTRAVENOUS
  Filled 2021-03-31 (×8): qty 50

## 2021-03-31 MED ORDER — KETOROLAC TROMETHAMINE 30 MG/ML IJ SOLN: 30.0000 mg | Freq: Three times a day (TID) | INTRAMUSCULAR | Status: DC | PRN

## 2021-03-31 MED ORDER — LORAZEPAM 2 MG/ML IJ SOLN
INTRAMUSCULAR | Status: AC
  Administered 2021-03-31: 2 mg via INTRAVENOUS
  Filled 2021-03-31: qty 1

## 2021-03-31 MED ORDER — PIPERACILLIN-TAZOBACTAM 3.375 G IVPB
3.3750 g | Freq: Once | INTRAVENOUS | Status: AC
  Administered 2021-03-31: 3.375 g via INTRAVENOUS
  Filled 2021-03-31: qty 50

## 2021-03-31 MED ORDER — IOHEXOL 350 MG/ML SOLN
100.0000 mL | Freq: Once | INTRAVENOUS | Status: AC | PRN
  Administered 2021-03-31: 100 mL via INTRAVENOUS

## 2021-03-31 MED ORDER — KETOROLAC TROMETHAMINE 30 MG/ML IJ SOLN
15.0000 mg | Freq: Three times a day (TID) | INTRAMUSCULAR | Status: DC | PRN
  Administered 2021-03-31 – 2021-04-01 (×4): 15 mg via INTRAVENOUS
  Filled 2021-03-31 (×4): qty 1

## 2021-03-31 MED ORDER — LACTATED RINGERS IV BOLUS
1000.0000 mL | Freq: Once | INTRAVENOUS | Status: AC
  Administered 2021-03-31: 1000 mL via INTRAVENOUS

## 2021-03-31 MED ORDER — METOPROLOL TARTRATE 12.5 MG HALF TABLET
12.5000 mg | ORAL_TABLET | Freq: Two times a day (BID) | ORAL | Status: DC
  Administered 2021-03-31 – 2021-04-03 (×6): 12.5 mg via ORAL
  Filled 2021-03-31 (×7): qty 1

## 2021-03-31 NOTE — Progress Notes (Signed)
Pharmacy Antibiotic Note  Alexander [name] is a 70 y.o. male admitted on 03/31/2021 with abdominal pain.  Pharmacy has been consulted for Zosyn dosing. Zosyn one time dose ordered in ED.   Plan: Start Zosyn 3.375g IV q8h (extended infusion) Monitor renal function, cultures, and clinical progression  Weight: 92.1 kg (203 lb)  Temp (24hrs), Avg:98.9 F (37.2 C), Min:98.6 F (37 C), Max:99.1 F (37.3 C)  Recent Labs  Lab 03/31/21 1123  WBC 15.9*  CREATININE 0.83    Estimated Creatinine Clearance: 92.7 mL/min (by C-G formula based on SCr of 0.83 mg/dL).    No Known Allergies  Antimicrobials this admission: Zosyn 10/31 >>  Dose adjustments this admission:   Microbiology results:   Thank you for allowing pharmacy to be a part of this patient's care.  Cristela Felt, PharmD, BCPS Clinical Pharmacist 03/31/2021 5:58 PM

## 2021-03-31 NOTE — Consult Note (Signed)
Referring Provider: Emergency Services. PCP: Eustaquio Boyden, MD   Gastroenterologist: Erick Blinks, MD Reason for consultation:   abdominal pain                 ASSESSMENT / PLAN   #   70 yo male with leukocytosis with left shift,  recurrent acute epigastric / RUQ pain reminiscent of choledocholithiasis / cholangitis in 2019.   Liver enzymes markedly elevated in a mixed pattern.   CTAP >> progressive atrophy of left lobe of liver with diminished caliber of the PV to the left hepatic lobe of unclear significance.  Infiltrative process such as cholangiocarcinoma not excluded. There is intrahepatic duct dilation seen again and the CBD is up to 1.4 cm. Rule out recurrent choledocholithiasis. Also, history of CBD narrowing ( lower third of duct) s/p dilation.at time of ERCP in 2019. Cytology >> atypical cells. Rule out recurrent stenosis of bile duct.   --Leukocytosis possibly biliary source but just case I ordered a urinalysis. CXR is unrevealing --MRCP today --ERCP tomorrow --Zosyn already ordered.  --INR  --am LFTs, CBC --CA19-9, CEA  # History of acquired duodenal stenosis requiring dilation x 2 in 2020. He seems to be asymptomatic at this point.   # SOB, chest pain last pm. Symptoms probably due to significant pain. High sensitivity troponin mildly elevated at 20. No clear ST elevation / depression on EKG --Recommend trending troponin.   # Additional medical history listed below.   HISTORY OF PRESENT ILLNESS                                                                                                                         Chief Complaint:  abdominal pain   Alexander [name] is a 70 y.o. male with a past medical history significant for duodenal stricture requiring dilation, cholecystectomy, choledocholithiasis s/p sphincterotomy, HTN, interstitial lung disease, sleep apnea, adenomatous colon polyps.  See PMH for any additional medical history.    ED course: Patient presented to  ED today with abdominal pain and SOB. The epigastric / RUQ pain started about two weeks ago. Initially it was intermittent but it now constant. The pain feels exactly like when he was hospitalized with choledocholithiasis / cholangitis. No fevers at home. Nausea with the pain yesterday but no vomiting. No fevers. Last night the pain was radiating up into his chest and he has had some associated shortness of breath. No major findings on CXR. WBC 15.9 with left shift. AST 236, ALT 367, alk phos 158, Tbili 5.8, high sensitivity troponin 20  CTAP w/ contrast No acute findings identified within the abdomen or pelvis.. Progressive atrophy of the left lobe of liver with diminished caliber of the portal vein to the left hepatic lobe. Etiology is indeterminate. Underlying infiltrative process such as cholangiocarcinoma cannot be excluded.   Imaging:  DG Chest 2 View  Result Date: 03/31/2021 CLINICAL DATA:  Shortness of breath. EXAM: CHEST - 2 VIEW COMPARISON:  Oct 18, 2020. FINDINGS:  The heart size and mediastinal contours are within normal limits. Hypoinflation of the lungs is noted with mild bibasilar subsegmental atelectasis or scarring. The visualized skeletal structures are unremarkable. IMPRESSION: Hypoinflation of the lungs with mild bibasilar subsegmental atelectasis or scarring. Electronically Signed   By: Marijo Conception M.D.   On: 03/31/2021 11:56    PREVIOUS ENDOSCOPIC EVALUATIONS  / IMAGING STUDIES   ERCP for cholangitis and bile duct stones -Duodenal stenosis. - Ascending cholangitis due to multiple filling defects consistent with a stones and sludge status post limited biliary sphincterotomy and endobiliary stent insertion with good drainage  ERCP for cholangitis follow up Sept 2019 -J-shaped deformity in the entire stomach. - Acquired duodenal stenosis just distal to the ampulla, precluding stable positioning. - Prior biliary sphincterotomy appeared open. - One partially occluded stent  from the biliary tree was seen in the major papilla. This was removed. - The fluoroscopic examination was suspicious for sludge. - The entire main bile duct was moderately dilated, with an obstruction. - Choledocholithiasis/sludge balls were found. Partial removal was accomplished with biliarysphincterotomy extension, sphincteroplasty and balloon sweep with stent insertion at the end of the procedure   ERCP for follow up on cholangitis and biliary stent removal October 2019 --No gross lesions in esophagus. - J-shaped deformity in the entire stomach. - No gross lesions in the duodenal bulb. - Acquired duodenal stenosis. - Prior biliary sphincterotomy appeared open. - One partially occluded stent from the biliary tree was seen in the major papilla. This stent was removed. - A filling defect consistent with a stone and sludge was seen on the cholangiogram. - A mild narrowing was found in the lower third of the main bile duct. The stricture was inflammatory appearing. This region was able to be traversed with a fully inflated retrieval balloon. This area was dilated and then brushed for cells for cytology evaluation. - The upper third of the main bile duct and middle third of the main bile duct were moderately dilated. - Choledocholithiasis and biliary sludge/debris was found. Complete removal was accomplished by balloon sphincteroplasty and then significant balloon sweeping  ** cytology- BILE DUCT BRUSHING DISTAL,STENT(SPECIMEN 1 OF 1 COLLECTED 03/30/18):ATYPICAL CELLS PRESENT, SEE COMMENT.   EGD Jan 2020 treatment of duodenal stenosis -A few, white nummular lesions in esophageal mucosa but otherwise normal. Biopsied for EoE and Candida rule out. - Z-line regular, 40 cm from the incisors. - J-shaped deformity in the entire stomach. - No other gross lesions in the stomach. Biopsied for HP. - Normal duodenal bulb. - Acquired duodenal stenosis. Biopsied. Dilated to 16.5 mm. - Patent  sphincterotomy, characterized by healthy appearing mucosa was found. - No other gross lesions in the second portion of the duodenum and in the third portion of the duodenum. - Biopsies were taken with a cold forceps for histology in the duodenal bulb, in the first portion of the duodenum, in the second portion of the duodenum and in the third portion of the duodenum for Celiac/Enteropathy rule out.  Polyp Surveillance colonoscopy January 2020  Complete exam.BBPS 8 Non-thrombosed internal hemorrhoids and perianal erythema found on digital rectal exam. - The examined portion of the ileum was normal. Biopsied. - One 2 mm polyp at the hepatic flexure, removed with a jumbo cold forceps. Resected and retrieved. - Normal mucosa in the entire examined colon. Biopsied. - Normal mucosa in the rectum. Biopsied. - Non-bleeding non-thrombosed internal hemorrhoids.  Duodenum, Biopsy - BENIGN DUODENAL MUCOSA - NO ACUTE INFLAMMATION, VILLOUS BLUNTING OR INCREASED INTRAEPITHELIAL  LYMPHOCYTES 2. Duodenum, Biopsy, Stricture - BENIGN DUODENAL MUCOSA - NO ACUTE INFLAMMATION, VILLOUS BLUNTING OR INCREASED INTRAEPITHELIAL LYMPHOCYTES 3. Stomach, biopsy - REACTIVE GASTROPATHY WITH FEATURES CONSISTENT WITH PROTON PUMP INHIBITOR EFFECT - NO H. PYLORI OR INTESTINAL METAPLASIA IDENTIFIED - SEE COMMENT 4. Esophagus, biopsy - REACTIVE SQUAMOUS MUCOSA WITH INCREASED INTRAEPITHELIAL LYMPHOCYTES - SEE COMMENT 5. Ileum, biopsy, Terminal Ileum - SMALL BOWEL MUCOSA WITH LYMPHOID AGGREGATES - NO ACUTE INFLAMMATION, GRANULOMAS OR MALIGNANCY IDENTIFIED 6. Colon, biopsy, Random - BENIGN COLONIC MUCOSA - NO ACTIVE INFLAMMATION OR EVIDENCE OF MICROSCOPIC COLITIS - NO HIGH GRADE DYSPLASIA OR MALIGNANCY IDENTIFIED 7. Colon, polyp(s), Hepatic Flexure - TUBULAR ADENOMA (1 OF 2 FRAGMENTS) - BENIGN COLONIC MUCOSA (1 OF 2 FRAGMENTS) - NO HIGH GRADE DYSPLASIA OR MALIGNANCY IDENTIFIED 8. Rectum, biopsy - BENIGN COLONIC MUCOSA -  NO ACTIVE INFLAMMATION OR EVIDENCE OF MICROSCOPIC COLITIS - NO HIGH GRADE DYSPLASIA OR MALIGNANCY IDENTIFIED 1 of  **Stain negative for H.pylori  EGD March 2022 follow up on stenosis No gross lesions in esophagus. - J-shaped gastric deformity. - Acquired duodenal stenosis just proximal to the ampulla. Dilated to 18 mm sequentially. - Patent sphincterotomy was found. - Mild deformity in the angle/turns of the D2/D3 region - not clear if a result of adhesive disease. However, normal mucosa was found in the second and third portions of the duodenu     Past Medical History:  Diagnosis Date   Acute cholecystitis 03/2016   with sepsis   Arthritis    CAD S/P percutaneous coronary angioplasty 1995, 2000, 2005   a. 1995 s/p BMS;  b. 2000 s/p BMS;  c. 2005 s/p stent - All stents in [facility] (RCA, LAD & OM - unknown on which date);  d. 04/2015 NSTEMI/Cath: LM nl, ost/p LAD 20%, patent mLAD stent, RI small, OM2 patent stent, pRCA 20%, patent stent, EF 45-50%-->Med Rx.   Calculus of bile duct with acute cholangitis with obstruction    Chronic pain syndrome    a. Followed @ [facility];  b. Uses 12-14 excedrins per day.   Duodenal stricture    Essential hypertension    Fatty liver    Gallstones    GERD (gastroesophageal reflux disease)    History of depression    History of post poliomyelitis muscular atrophy    HLD (hyperlipidemia)    Ischemic cardiomyopathy    a. 04/2015 Echo: EF 40-45%, mod antsept, ant, antlat, apical HK, mild AI/MR, PASP 30mmHg.   Migraines    Neuropathy    due to post polio syndrome   NSTEMI (non-ST elevated myocardial infarction) ([place]) 04/2015   - Demand Ischemia in setting of Sepsis (also with prior MI history   OSA (obstructive sleep apnea)    does not want to use CPAP, using mouth guard   PAF (paroxysmal atrial fibrillation) ([place])    a. 04/2015 in setting of cholecystits and sepsis -->Amio;  b. CHA2DS2VASc = 4--> Eliquis.   Pneumonia 04/2015    Post-polio syndrome    a. ambulates with braces.   Septic shock ([place]) 01/07/2018   Shoulder pain, right     Past Surgical History:  Procedure Laterality Date   BALLOON DILATION N/A 06/06/2018   Procedure: BALLOON DILATION;  Surgeon: Rush Landmark Telford Nab., MD;  Location: [place];  Service: Gastroenterology;  Laterality: N/A;   BALLOON DILATION N/A 07/06/2018   Procedure: BALLOON DILATION;  Surgeon: Rush Landmark Telford Nab., MD;  Location: [place];  Service: Gastroenterology;  Laterality: N/A;   BILIARY STENT PLACEMENT  01/08/2018   Procedure: BILIARY STENT PLACEMENT;  Surgeon: Jackquline Denmark, MD;  Location: [facility] ENDOSCOPY;  Service: Endoscopy;;   BILIARY STENT PLACEMENT N/A 02/09/2018   Procedure: BILIARY STENT PLACEMENT;  Surgeon: Irving Copas., MD;  Location: [facility]OS[facility];  Service: Gastroenterology;  Laterality: N/A;   BIOPSY  06/06/2018   Procedure: BIOPSY;  Surgeon: Rush Landmark Telford Nab., MD;  Location: [facility] ENDOSCOPY;  Service: Gastroenterology;;   CARDIAC CATHETERIZATION N/A 04/30/2015   Procedure: Left Heart Cath and Coronary Angiography;  Surgeon: Troy Sine, MD;  Location: [place] CV LAB;  Service: Cardiovascular; LM nl, ost/p LAD 20%, patent mLAD stent, RI small, OM2 patent stent, pRCA 20%, patent stent, EF 45-50%-->Med Rx    cervical spine stimulator  08/2012   CHOLECYSTECTOMY  06/10/2015   Procedure: LAPAROSCOPIC CHOLECYSTECTOMY;  Surgeon: Arta Bruce Kinsinger, MD;  Location: [place];  Service: General;;   COLONOSCOPY  09/2013   TA x2, rpt 5 yrs ([place])   COLONOSCOPY WITH ESOPHAGOGASTRODUODENOSCOPY (EGD)  06/2018   reactive gastropathy, tubular adenoma, rpt colonoscopy 5 yrs ([place])   COLONOSCOPY WITH PROPOFOL N/A 06/06/2018   Procedure: COLONOSCOPY WITH PROPOFOL;  Surgeon: Irving Copas., MD;  Location: [place];  Service: Gastroenterology;  Laterality: N/A;   CORONARY ANGIOPLASTY WITH STENT PLACEMENT  1995, 2000, 2005   stents in RCA, LAD &  Cx-OM. -Stents were patent by cardiac catheterization in November 2016   ENDOSCOPIC RETROGRADE CHOLANGIOPANCREATOGRAPHY (ERCP) WITH PROPOFOL N/A 02/09/2018   Procedure: ENDOSCOPIC RETROGRADE CHOLANGIOPANCREATOGRAPHY (ERCP) WITH PROPOFOL;  Surgeon: Irving Copas., MD;  Location: [facility];  Service: Gastroenterology;  Laterality: N/A;   ENDOSCOPIC RETROGRADE CHOLANGIOPANCREATOGRAPHY (ERCP) WITH PROPOFOL N/A 03/30/2018   Procedure: ENDOSCOPIC RETROGRADE CHOLANGIOPANCREATOGRAPHY (ERCP) WITH PROPOFOL;  Surgeon: Rush Landmark Telford Nab., MD;  Location: [facility] ENDOSCOPY;  Service: Gastroenterology;  Laterality: N/A;   ERCP N/A 01/08/2018   Procedure: ENDOSCOPIC RETROGRADE CHOLANGIOPANCREATOGRAPHY (ERCP);  Surgeon: Jackquline Denmark, MD;  Location: [facility] ENDOSCOPY;  Service: Endoscopy;  Laterality: N/A;   ESOPHAGOGASTRODUODENOSCOPY N/A 03/30/2018   Procedure: ESOPHAGOGASTRODUODENOSCOPY (EGD);  Surgeon: Irving Copas., MD;  Location: [facility];  Service: Gastroenterology;  Laterality: N/A;   ESOPHAGOGASTRODUODENOSCOPY (EGD) WITH PROPOFOL N/A 06/06/2018   Procedure: ESOPHAGOGASTRODUODENOSCOPY (EGD) WITH PROPOFOL;  Surgeon: Rush Landmark Telford Nab., MD;  Location: [place];  Service: Gastroenterology;  Laterality: N/A;   ESOPHAGOGASTRODUODENOSCOPY (EGD) WITH PROPOFOL N/A 07/06/2018   Procedure: ESOPHAGOGASTRODUODENOSCOPY (EGD) WITH PROPOFOL;  Surgeon: Rush Landmark Telford Nab., MD;  Location: [place];  Service: Gastroenterology;  Laterality: N/A;   lumbar spine stimulator     POLYPECTOMY  06/06/2018   Procedure: POLYPECTOMY;  Surgeon: Mansouraty, Telford Nab., MD;  Location: [place];  Service: Gastroenterology;;   REMOVAL OF STONES  03/30/2018   Procedure: REMOVAL OF STONES;  Surgeon: Irving Copas., MD;  Location: [facility];  Service: Gastroenterology;;   Joan Mayans  01/08/2018   Procedure: Joan Mayans;  Surgeon: Jackquline Denmark, MD;  Location: [place];  Service:  Endoscopy;;   SPHINCTEROTOMY  02/09/2018   Procedure: Joan Mayans;  Surgeon: Mansouraty, Telford Nab., MD;  Location: [facility];  Service: Gastroenterology;;   STENT REMOVAL  02/09/2018   Procedure: STENT REMOVAL;  Surgeon: Irving Copas., MD;  Location: [facility] ENDOSCOPY;  Service: Gastroenterology;;   Lavell Islam REMOVAL  03/30/2018   Procedure: STENT REMOVAL;  Surgeon: Irving Copas., MD;  Location: [facility] ENDOSCOPY;  Service: Gastroenterology;;   TRANSTHORACIC ECHOCARDIOGRAM  04/2015   EF 40-45%, mod antsept, ant, antlat, apical HK, mild AI/MR, PASP 30mmHg.   UVULOPALATOPHARYNGOPLASTY, TONSILLECTOMY AND SEPTOPLASTY  2002    Prior to Admission medications   Medication Sig Start Date End Date Taking? Authorizing Provider  acetaminophen (TYLENOL) 500 MG tablet Take 1,000 mg by mouth every 6 (six) hours as needed for moderate pain or headache.     [provider]  Ascorbic Acid (VITAMIN C) 1000 MG tablet Take 1,000 mg by mouth [name].    [provider]  Carboxymethylcellulose Sodium (LUBRICANT EYE DROPS OP) Place 2 drops into both eyes [name] as needed (for dry eyes).    [provider]  Cholecalciferol (VITAMIN D3) 50 MCG (2000 UT) TABS Take 2,000 Units by mouth [name].    [provider]  gabapentin (NEURONTIN) 600 MG tablet Take 2 tablets (1,200 mg total) by mouth 3 (three) times [name]. 10/18/20   [name], MD  Magnesium 250 MG TABS Take 1 tablet (250 mg total) by mouth at bedtime. Patient taking differently: Take 250 mg by mouth at bedtime. 11/11/17   [name], MD  metoprolol tartrate (LOPRESSOR) 25 MG tablet TAKE 12.5 MG ( 1/2 TABLET) TWICE A DAY . MAY TAKE AN ADDITIONAL 25 MG IF NEEDED FOR PALPS. 07/31/20   Leonie Man, MD  Multiple Vitamin (MULTIVITAMIN WITH MINERALS) TABS tablet Take 1 tablet by mouth [name].    [provider]  Omega-3 Fatty Acids (FISH OIL) 1000 MG CAPS Take 1,000 mg by mouth [name].     [provider]  omeprazole (PRILOSEC) 40 MG capsule Take 1 capsule (40 mg total) by mouth [name]. 10/18/20   [name], MD  rosuvastatin (CRESTOR) 40 MG tablet Take 1 tablet (40 mg total) by mouth [name]. 10/18/20   [name], MD  sildenafil (VIAGRA) 100 MG tablet TAKE 1/2 TO 1 TABLET BY MOUTH [name] AS NEEDED FOR ERECTILE DYSFUNCTION 10/18/20   [name], MD  vitamin B-12 (CYANOCOBALAMIN) 1000 MCG tablet Take 1,000 mcg by mouth [name].    [provider]  vitamin E 400 UNIT capsule Take 400 Units by mouth [name].    [provider]    Current Facility-Administered Medications  Medication Dose Route Frequency Provider Last Rate Last Admin   HYDROcodone-acetaminophen (NORCO/VICODIN) 5-325 MG per tablet 1 tablet  1 tablet Oral Once Evlyn Courier, PA-C       Current Outpatient Medications  Medication Sig Dispense Refill   acetaminophen (TYLENOL) 500 MG tablet Take 1,000 mg by mouth every 6 (six) hours as needed for moderate pain or headache.      Ascorbic Acid (VITAMIN C) 1000 MG tablet Take 1,000 mg by mouth [name].     Carboxymethylcellulose Sodium (LUBRICANT EYE DROPS OP) Place 2 drops into both eyes [name] as needed (for dry eyes).     Cholecalciferol (VITAMIN D3) 50 MCG (2000 UT) TABS Take 2,000 Units by mouth [name].     gabapentin (NEURONTIN) 600 MG tablet Take 2 tablets (1,200 mg total) by mouth 3 (three) times [name]. 540 tablet 3   Magnesium 250 MG TABS Take 1 tablet (250 mg total) by mouth at bedtime. (Patient taking differently: Take 250 mg by mouth at bedtime.)  0   metoprolol tartrate (LOPRESSOR) 25 MG tablet TAKE 12.5 MG ( 1/2 TABLET) TWICE A DAY . MAY TAKE AN ADDITIONAL 25 MG IF NEEDED FOR PALPS. 120 tablet 3   Multiple Vitamin (MULTIVITAMIN WITH MINERALS) TABS tablet Take 1 tablet by mouth [name].     Omega-3 Fatty Acids (FISH OIL) 1000 MG CAPS Take 1,000 mg by mouth [name].  omeprazole (PRILOSEC) 40 MG capsule Take 1 capsule (40 mg total) by mouth  [name]. 90 capsule 3   rosuvastatin (CRESTOR) 40 MG tablet Take 1 tablet (40 mg total) by mouth [name]. 90 tablet 3   sildenafil (VIAGRA) 100 MG tablet TAKE 1/2 TO 1 TABLET BY MOUTH [name] AS NEEDED FOR ERECTILE DYSFUNCTION 6 tablet 0   vitamin B-12 (CYANOCOBALAMIN) 1000 MCG tablet Take 1,000 mcg by mouth [name].     vitamin E 400 UNIT capsule Take 400 Units by mouth [name].      Allergies as of 03/31/2021   (No Known Allergies)    Family History  Problem Relation Age of Onset   Diabetes Mother    Heart disease Mother    Cancer Sister 88       male cancer   Breast cancer Brother 35   Hypertension Maternal Aunt    Coronary artery disease Neg Hx    Stroke Neg Hx    Colon cancer Neg Hx    Pancreatic cancer Neg Hx    Stomach cancer Neg Hx    Esophageal cancer Neg Hx    Inflammatory bowel disease Neg Hx    Liver disease Neg Hx    Rectal cancer Neg Hx     Social History   Socioeconomic History   Marital status: Married    Spouse name: Not on file   Number of children: 3   Years of education: Not on file   Highest education level: Not on file  Occupational History   Occupation: retired  Tobacco Use   Smoking status: Former    Types: Cigarettes    Quit date: 1972    Years since quitting: 50.8   Smokeless tobacco: Never   Tobacco comments:    was very light  Vaping Use   Vaping Use: Never used  Substance and Sexual Activity   Alcohol use: Yes    Alcohol/week: 0.0 standard drinks    Comment: moderate   Drug use: No   Sexual activity: Yes  Other Topics Concern   Not on file  Social History Narrative   Caffeine: 1 cup soda, occasional coffee   Lives with wife [name]) and 55 yo son, 4 dogs   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   OBJECTIVE    Physical Exam: Vital signs in last 24 hours: Temp:  [99.1 F (37.3 C)] 99.1 F (37.3 C) (10/31 1115) Pulse Rate:  [77] 77 (10/31 1115) Resp:  [18] 18 (10/31 1115) BP: (106)/(75) 106/75 (10/31 1115) SpO2:  [95 %] 95 % (10/31 1115)    General:  Alert male in NAD Psych:  Pleasant, cooperative. Normal mood and affect Eyes: Pupils equal, no icterus. Conjunctive pink Ears:  Normal auditory acuity Nose: No deformity, discharge or lesions Neck:  Supple, no masses felt Lungs:  Clear to auscultation.  Heart:  Regular rate, regular rhythm. No lower extremity edema Abdomen:  Soft, nondistended, mild epigastric / RUQ tenderness,  active bowel sounds, no masses felt Rectal :  Deferred Msk: Symmetrical without gross deformities.  Neurologic:  Alert, oriented, grossly normal neurologically Skin:  Intact without significant lesions.    Scheduled inpatient medications  HYDROcodone-acetaminophen  1 tablet Oral Once      Intake/Output from previous day: No intake/output data recorded. Intake/Output this shift: No intake/output data recorded.   Lab Results: Recent Labs    03/31/21 1123  WBC 15.9*  HGB 14.7  HCT 42.8  PLT 231   BMET No results for input(s): NA, K, CL, CO2, GLUCOSE, BUN, CREATININE, CALCIUM in the last 72 hours. LFTs No results for input(s): PROT, ALBUMIN, AST, ALT, ALKPHOS, BILITOT, BILIDIR, IBILI in the last 72 hours. PT/INR No results for input(s): LABPROT, INR in the last 72 hours. Hepatitis Panel No results for input(s): HEPBSAG, HCVAB, HEPAIGM, HEPBIGM in the last 72 hours.   . CBC Latest Ref Rng & Units 03/31/2021 10/11/2020 01/06/2019  WBC 4.0 - 10.5 K/uL 15.9(H) 7.7  7.7  Hemoglobin 13.0 - 17.0 g/dL 14.7 14.8 15.1  Hematocrit 39.0 - 52.0 % 42.8 43.0 44.2  Platelets 150 - 400 K/uL 231 238.0 274.0    . CMP Latest Ref Rng & Units 10/11/2020 01/06/2019 11/24/2018  Glucose 70 - 99 mg/dL 121(H) 105(H) 108(H)  BUN 6 - 23 mg/dL $Remove'21 22 20  'fBZmptp$ Creatinine 0.40 - 1.50 mg/dL 0.65 0.62 0.64  Sodium 135 - 145 mEq/L 143 138 142  Potassium 3.5 - 5.1 mEq/L 4.4 4.2 4.3  Chloride 96 - 112 mEq/L 105 102 106  CO2 19 - 32 mEq/L $Remove'29 29 28  'DCtBsvU$ Calcium 8.4 - 10.5 mg/dL 9.3 9.8 8.9  Total Protein 6.0 - 8.3 g/dL 7.5 7.7 -  Total Bilirubin 0.2 - 1.2 mg/dL 0.5 0.7 -  Alkaline Phos 39 - 117 U/L 52 66 -  AST 0 - 37 U/L 16 21 -  ALT 0 - 53 U/L 19 34 -     Active Problems:   * No active hospital problems. Tye Savoy, NP-C @  03/31/2021, 12:39 PM

## 2021-03-31 NOTE — Telephone Encounter (Signed)
Patty, I am sorry to hear about this. I will put the inpatient team on this as well.  Hey team, this is a patient that a few years ago had a duodenal stricture that I dilated and has done well for over 2-1/2 years. May be reasonable to consider repeat endoscopy/enteroscopy with dilation versus beginning with an upper GI series and then endoscopy/enteroscopy.  If he is having lots of issues they may end up doing a CT scan but certainly reasonable to make sure that he has not developed a recurrent outlet obstruction. Thanks for taking care of him and let me know if there is something else I can be of assistance with. GM

## 2021-03-31 NOTE — ED Notes (Addendum)
EDP at the bedside. Pt stated abdominal pain has been constant since Saturday. It became worse last night. Endorses sob and light headiness. Pt states stomach feels bloated with epigastric pain that goes to the left side. Pt says his appetite has decreased. Pt has been taking miralax for constipation. Pt had gallbladder removed a few years back. Mouth is pink and dry. Pt states when he drinks water he has to go to the restroom a lot so he decreased his water intake.

## 2021-03-31 NOTE — ED Notes (Signed)
Provided pt with slip resistant socks.

## 2021-03-31 NOTE — Telephone Encounter (Signed)
FYI Dr Rush Landmark the pt is going to [facility] ED for "possible blockage".

## 2021-03-31 NOTE — ED Triage Notes (Signed)
EMS stated, Upper right abdominal pain , started last night with some SOB  , pt had his gallbladder out a few years back but he says it feels like that.

## 2021-03-31 NOTE — ED Provider Notes (Signed)
Emergency Medicine Provider Triage Evaluation Note  Alexander [name] , a 70 y.o. male  was evaluated in triage.  Pt complains of worsening abdominal pain, shortness of breath, and chest.  Review of Systems  Positive: Abdominal pain, shortness of breath, chest pain, nausea Negative: Fever, vomiting, palpitations, or diaphoresis  Physical Exam  BP 106/75 (BP Location: Left Arm)   Pulse 77   Temp 99.1 F (37.3 C) (Oral)   Resp 18   SpO2 95%  Gen:   Awake, appears uncomfortable Resp:  Normal effort  MSK:   Moves extremities without difficulty  Other:    Medical Decision Making  Medically screening exam initiated at 11:22 AM.  Appropriate orders placed.  Luisfernando Brightwell Verde was informed that the remainder of the evaluation will be completed by another provider, this initial triage assessment does not replace that evaluation, and the importance of remaining in the ED until their evaluation is complete.     Evlyn Courier, PA-C 03/31/21 1127    Lorelle Gibbs, DO 04/01/21 [phone]

## 2021-03-31 NOTE — ED Notes (Signed)
Paged MD to get Ativan ordered for MRI. Once medication is given MRI will be ready just call and alert MRI.

## 2021-03-31 NOTE — H&P (Signed)
History and Physical    Alexander [name] RXV:[id number] DOB: April 28, 1951 DOA: 03/31/2021  PCP: [name], MD  Patient coming from: home   Chief Complaint: abdominal pain  HPI: Alexander [name] is a 70 y.o. male with medical history significant for polio, nstemi s/p stents, htn, choledocholithiasis (s/p erc/ with sphincterotomy and balloon sweeping) presenting with the above.  Symptoms present for about 2 weeks. Has had intermittent upper abdominal pain greatest in the RUQ but spreading across the upper abdomen. Has increased in frequency, today constant and severe with associated nausea. No vomiting or diarrhea. Symptoms very similar to previous bout of symptomatic gallstones. No fever, no cough, no chest pain. Denies toxic habits.  ED Course:   Labs, imaging, GI consult.  Review of Systems: As per HPI otherwise 10 point review of systems negative.    Past Medical History:  Diagnosis Date   Acute cholecystitis 03/2016   with sepsis   Arthritis    CAD S/P percutaneous coronary angioplasty 1995, 2000, 2005   a. 1995 s/p BMS;  b. 2000 s/p BMS;  c. 2005 s/p stent - All stents in [facility] (RCA, LAD & OM - unknown on which date);  d. 04/2015 NSTEMI/Cath: LM nl, ost/p LAD 20%, patent mLAD stent, RI small, OM2 patent stent, pRCA 20%, patent stent, EF 45-50%-->Med Rx.   Calculus of bile duct with acute cholangitis with obstruction    Chronic pain syndrome    a. Followed @ [facility];  b. Uses 12-14 excedrins per day.   Duodenal stricture    Essential hypertension    Fatty liver    Gallstones    GERD (gastroesophageal reflux disease)    History of depression    History of post poliomyelitis muscular atrophy    HLD (hyperlipidemia)    Ischemic cardiomyopathy    a. 04/2015 Echo: EF 40-45%, mod antsept, ant, antlat, apical HK, mild AI/MR, PASP 56mmHg.   Migraines    Neuropathy    due to post polio syndrome   NSTEMI (non-ST elevated myocardial infarction) ([place]) 04/2015   -  Demand Ischemia in setting of Sepsis (also with prior MI history   OSA (obstructive sleep apnea)    does not want to use CPAP, using mouth guard   PAF (paroxysmal atrial fibrillation) ([place])    a. 04/2015 in setting of cholecystits and sepsis -->Amio;  b. CHA2DS2VASc = 4--> Eliquis.   Pneumonia 04/2015   Post-polio syndrome    a. ambulates with braces.   Septic shock ([place]) 01/07/2018   Shoulder pain, right     Past Surgical History:  Procedure Laterality Date   BALLOON DILATION N/A 06/06/2018   Procedure: BALLOON DILATION;  Surgeon: Rush Landmark Telford Nab., MD;  Location: [place];  Service: Gastroenterology;  Laterality: N/A;   BALLOON DILATION N/A 07/06/2018   Procedure: BALLOON DILATION;  Surgeon: Rush Landmark Telford Nab., MD;  Location: [place];  Service: Gastroenterology;  Laterality: N/A;   BILIARY STENT PLACEMENT  01/08/2018   Procedure: BILIARY STENT PLACEMENT;  Surgeon: Jackquline Denmark, MD;  Location: [facility] ENDOSCOPY;  Service: Endoscopy;;   BILIARY STENT PLACEMENT N/A 02/09/2018   Procedure: BILIARY STENT PLACEMENT;  Surgeon: Irving Copas., MD;  Location: [facility] ENDOSCOPY;  Service: Gastroenterology;  Laterality: N/A;   BIOPSY  06/06/2018   Procedure: BIOPSY;  Surgeon: Rush Landmark Telford Nab., MD;  Location: [facility] ENDOSCOPY;  Service: Gastroenterology;;   CARDIAC CATHETERIZATION N/A 04/30/2015   Procedure: Left Heart Cath and Coronary Angiography;  Surgeon: Troy Sine, MD;  Location: [place] [facility];  Service: Cardiovascular; LM nl, ost/p LAD 20%, patent mLAD stent, RI small, OM2 patent stent, pRCA 20%, patent stent, EF 45-50%-->Med Rx    cervical spine stimulator  08/2012   CHOLECYSTECTOMY  06/10/2015   Procedure: LAPAROSCOPIC CHOLECYSTECTOMY;  Surgeon: Arta Bruce Kinsinger, MD;  Location: [place];  Service: General;;   COLONOSCOPY  09/2013   TA x2, rpt 5 yrs ([place])   COLONOSCOPY WITH ESOPHAGOGASTRODUODENOSCOPY (EGD)  06/2018   reactive gastropathy, tubular adenoma, rpt  colonoscopy 5 yrs ([place])   COLONOSCOPY WITH PROPOFOL N/A 06/06/2018   Procedure: COLONOSCOPY WITH PROPOFOL;  Surgeon: Irving Copas., MD;  Location: [place];  Service: Gastroenterology;  Laterality: N/A;   CORONARY ANGIOPLASTY WITH STENT PLACEMENT  1995, 2000, 2005   stents in RCA, LAD & Cx-OM. -Stents were patent by cardiac catheterization in November 2016   ENDOSCOPIC RETROGRADE CHOLANGIOPANCREATOGRAPHY (ERCP) WITH PROPOFOL N/A 02/09/2018   Procedure: ENDOSCOPIC RETROGRADE CHOLANGIOPANCREATOGRAPHY (ERCP) WITH PROPOFOL;  Surgeon: Irving Copas., MD;  Location: [facility];  Service: Gastroenterology;  Laterality: N/A;   ENDOSCOPIC RETROGRADE CHOLANGIOPANCREATOGRAPHY (ERCP) WITH PROPOFOL N/A 03/30/2018   Procedure: ENDOSCOPIC RETROGRADE CHOLANGIOPANCREATOGRAPHY (ERCP) WITH PROPOFOL;  Surgeon: Rush Landmark Telford Nab., MD;  Location: [facility] ENDOSCOPY;  Service: Gastroenterology;  Laterality: N/A;   ERCP N/A 01/08/2018   Procedure: ENDOSCOPIC RETROGRADE CHOLANGIOPANCREATOGRAPHY (ERCP);  Surgeon: Jackquline Denmark, MD;  Location: [facility] ENDOSCOPY;  Service: Endoscopy;  Laterality: N/A;   ESOPHAGOGASTRODUODENOSCOPY N/A 03/30/2018   Procedure: ESOPHAGOGASTRODUODENOSCOPY (EGD);  Surgeon: Irving Copas., MD;  Location: [facility];  Service: Gastroenterology;  Laterality: N/A;   ESOPHAGOGASTRODUODENOSCOPY (EGD) WITH PROPOFOL N/A 06/06/2018   Procedure: ESOPHAGOGASTRODUODENOSCOPY (EGD) WITH PROPOFOL;  Surgeon: Rush Landmark Telford Nab., MD;  Location: [place];  Service: Gastroenterology;  Laterality: N/A;   ESOPHAGOGASTRODUODENOSCOPY (EGD) WITH PROPOFOL N/A 07/06/2018   Procedure: ESOPHAGOGASTRODUODENOSCOPY (EGD) WITH PROPOFOL;  Surgeon: Rush Landmark Telford Nab., MD;  Location: [place];  Service: Gastroenterology;  Laterality: N/A;   lumbar spine stimulator     POLYPECTOMY  06/06/2018   Procedure: POLYPECTOMY;  Surgeon: Mansouraty, Telford Nab., MD;  Location: [place];   Service: Gastroenterology;;   REMOVAL OF STONES  03/30/2018   Procedure: REMOVAL OF STONES;  Surgeon: Irving Copas., MD;  Location: [facility];  Service: Gastroenterology;;   Joan Mayans  01/08/2018   Procedure: Joan Mayans;  Surgeon: Jackquline Denmark, MD;  Location: [place];  Service: Endoscopy;;   SPHINCTEROTOMY  02/09/2018   Procedure: Joan Mayans;  Surgeon: Mansouraty, Telford Nab., MD;  Location: [facility];  Service: Gastroenterology;;   STENT REMOVAL  02/09/2018   Procedure: STENT REMOVAL;  Surgeon: Irving Copas., MD;  Location: [facility] ENDOSCOPY;  Service: Gastroenterology;;   Lavell Islam REMOVAL  03/30/2018   Procedure: STENT REMOVAL;  Surgeon: Irving Copas., MD;  Location: [facility] ENDOSCOPY;  Service: Gastroenterology;;   TRANSTHORACIC ECHOCARDIOGRAM  04/2015   EF 40-45%, mod antsept, ant, antlat, apical HK, mild AI/MR, PASP 72mmHg.   UVULOPALATOPHARYNGOPLASTY, TONSILLECTOMY AND SEPTOPLASTY  2002     reports that he quit smoking about 50 years ago. His smoking use included cigarettes. He has never used smokeless tobacco. He reports current alcohol use. He reports that he does not use drugs.  No Known Allergies  Family History  Problem Relation Age of Onset   Diabetes Mother    Heart disease Mother    Cancer Sister 54       male cancer   Breast cancer Brother 67   Hypertension Maternal Aunt    Coronary artery disease  Neg Hx    Stroke Neg Hx    Colon cancer Neg Hx    Pancreatic cancer Neg Hx    Stomach cancer Neg Hx    Esophageal cancer Neg Hx    Inflammatory bowel disease Neg Hx    Liver disease Neg Hx    Rectal cancer Neg Hx     Prior to Admission medications   Medication Sig Start Date End Date Taking? Authorizing Provider  acetaminophen (TYLENOL) 500 MG tablet Take 1,000 mg by mouth every 6 (six) hours as needed for moderate pain or headache.     [provider]  Ascorbic Acid (VITAMIN C) 1000 MG tablet Take 1,000 mg by mouth  daily.    [provider]  Carboxymethylcellulose Sodium (LUBRICANT EYE DROPS OP) Place 2 drops into both eyes daily as needed (for dry eyes).    [provider]  Cholecalciferol (VITAMIN D3) 50 MCG (2000 UT) TABS Take 2,000 Units by mouth daily.    [provider]  gabapentin (NEURONTIN) 600 MG tablet Take 2 tablets (1,200 mg total) by mouth 3 (three) times daily. 10/18/20   [name], MD  Magnesium 250 MG TABS Take 1 tablet (250 mg total) by mouth at bedtime. Patient taking differently: Take 250 mg by mouth at bedtime. 11/11/17   [name], MD  metoprolol tartrate (LOPRESSOR) 25 MG tablet TAKE 12.5 MG ( 1/2 TABLET) TWICE A DAY . MAY TAKE AN ADDITIONAL 25 MG IF NEEDED FOR PALPS. 07/31/20   Leonie Man, MD  Multiple Vitamin (MULTIVITAMIN WITH MINERALS) TABS tablet Take 1 tablet by mouth daily.    [provider]  Omega-3 Fatty Acids (FISH OIL) 1000 MG CAPS Take 1,000 mg by mouth daily.     [provider]  omeprazole (PRILOSEC) 40 MG capsule Take 1 capsule (40 mg total) by mouth daily. 10/18/20   [name], MD  rosuvastatin (CRESTOR) 40 MG tablet Take 1 tablet (40 mg total) by mouth daily. 10/18/20   [name], MD  sildenafil (VIAGRA) 100 MG tablet TAKE 1/2 TO 1 TABLET BY MOUTH DAILY AS NEEDED FOR ERECTILE DYSFUNCTION 10/18/20   [name], MD  vitamin B-12 (CYANOCOBALAMIN) 1000 MCG tablet Take 1,000 mcg by mouth daily.    [provider]  vitamin E 400 UNIT capsule Take 400 Units by mouth daily.    [provider]    Physical Exam: Vitals:   03/31/21 1115 03/31/21 1436  BP: 106/75 108/76  Pulse: 77 78  Resp: 18 16  Temp: 99.1 F (37.3 C) 98.6 F (37 C)  TempSrc: Oral   SpO2: 95% 98%    Constitutional: No acute distress Head: Atraumatic Eyes: Conjunctiva clear ENM: Moist mucous membranes. Normal dentition.  Neck: Supple Respiratory: Clear to auscultation bilaterally, no  wheezing/rales/rhonchi. Normal respiratory effort. No accessory muscle use. . Cardiovascular: Regular rate and rhythm. No murmurs/rubs/gallops. Abdomen: obese, mildly distended, ttp upper abd worse ruq Musculoskeletal: decreased tone of legs Skin: No rashes, lesions, or ulcers.  Extremities: No peripheral edema. Palpable peripheral pulses. Neurologic: Alert, moving all 4 extremities. Psychiatric: Normal insight and judgement.   Labs on Admission: I have personally reviewed following labs and imaging studies  CBC: Recent Labs  Lab 03/31/21 1123  WBC 15.9*  NEUTROABS 14.8*  HGB 14.7  HCT 42.8  MCV 95.3  PLT 812   Basic Metabolic Panel: Recent Labs  Lab 03/31/21 1123  NA 136  K 3.7  CL 103  CO2 19*  GLUCOSE 94  BUN 17  CREATININE 0.83  CALCIUM 9.5   GFR: CrCl cannot be calculated (Unknown ideal weight.). Liver Function Tests: Recent Labs  Lab 03/31/21 1123  AST 236*  ALT 367*  ALKPHOS 158*  BILITOT 5.8*  PROT 7.5  ALBUMIN 3.9   Recent Labs  Lab 03/31/21 1123  LIPASE 22   No results for input(s): AMMONIA in the last 168 hours. Coagulation Profile: No results for input(s): INR, PROTIME in the last 168 hours. Cardiac Enzymes: No results for input(s): CKTOTAL, CKMB, CKMBINDEX, TROPONINI in the last 168 hours. BNP (last 3 results) No results for input(s): PROBNP in the last 8760 hours. HbA1C: No results for input(s): HGBA1C in the last 72 hours. CBG: No results for input(s): GLUCAP in the last 168 hours. Lipid Profile: No results for input(s): CHOL, HDL, LDLCALC, TRIG, CHOLHDL, LDLDIRECT in the last 72 hours. Thyroid Function Tests: No results for input(s): TSH, T4TOTAL, FREET4, T3FREE, THYROIDAB in the last 72 hours. Anemia Panel: No results for input(s): VITAMINB12, FOLATE, FERRITIN, TIBC, IRON, RETICCTPCT in the last 72 hours. Urine analysis:    Component Value Date/Time   COLORURINE AMBER (A) 01/07/2018 0949   APPEARANCEUR CLEAR 01/07/2018 0949    LABSPEC 1.017 01/07/2018 0949   PHURINE 7.0 01/07/2018 0949   GLUCOSEU NEGATIVE 01/07/2018 0949   HGBUR SMALL (A) 01/07/2018 0949   BILIRUBINUR SMALL (A) 01/07/2018 0949   KETONESUR 80 (A) 01/07/2018 0949   PROTEINUR 30 (A) 01/07/2018 0949   NITRITE NEGATIVE 01/07/2018 0949   LEUKOCYTESUR NEGATIVE 01/07/2018 0949    Radiological Exams on Admission: DG Chest 2 View  Result Date: 03/31/2021 CLINICAL DATA:  Shortness of breath. EXAM: CHEST - 2 VIEW COMPARISON:  Oct 18, 2020. FINDINGS: The heart size and mediastinal contours are within normal limits. Hypoinflation of the lungs is noted with mild bibasilar subsegmental atelectasis or scarring. The visualized skeletal structures are unremarkable. IMPRESSION: Hypoinflation of the lungs with mild bibasilar subsegmental atelectasis or scarring. Electronically Signed   By: Marijo Conception M.D.   On: 03/31/2021 11:56   CT ABDOMEN PELVIS W CONTRAST  Result Date: 03/31/2021 CLINICAL DATA:  Left upper quadrant pain, acute EXAM: CT ABDOMEN AND PELVIS WITH CONTRAST TECHNIQUE: Multidetector CT imaging of the abdomen and pelvis was performed using the standard protocol following bolus administration of intravenous contrast. CONTRAST:  176mL OMNIPAQUE IOHEXOL 350 MG/ML SOLN COMPARISON:  01/07/2018 FINDINGS: Lower chest: Chronic interstitial reticulation, ground-glass attenuation, and mild traction bronchiectasis identified within the lung bases compatible with fibrotic interstitial lung disease. Hepatobiliary: Heterogeneous fatty deposition within the right hepatic lobe is identified. No focal liver lesion identified. There is progressive atrophy of the left lobe of liver since 01/07/2018. The portal vein to the left hepatic lobe is markedly diminished in caliber compared with the previous exam. Previous cholecystectomy. Intrahepatic bile duct dilatation is again noted. The common bile duct is increased in caliber measuring up to 1.4 cm. This is unchanged when  compared with the previous exam. Several foci of pneumobilia identified within the left hepatic lobe and distal CBD. Pancreas: No focal pancreas lesion identified. No main duct dilatation or inflammation. Spleen: Normal in size without focal abnormality. Adrenals/Urinary Tract: Normal right adrenal gland. Left adrenal nodule measures 1.5 cm and is unchanged from previous exam, likely reflecting a benign adenoma. Posterior cortex left kidney cyst measures 1.6 cm. No nephrolithiasis or hydronephrosis. Urinary bladder is unremarkable. Stomach/Bowel: Stomach is within normal limits. Appendix appears normal. No evidence of bowel wall thickening, distention, or inflammatory changes.  Vascular/Lymphatic: Aortic atherosclerosis. No aneurysm. No abdominopelvic adenopathy. Reproductive: Prostate is unremarkable. Other: No free fluid or fluid collections. Musculoskeletal: Multilevel lumbar degenerative disc disease. Bilateral L5 pars defects identified with mild anterolisthesis of L5 on S1. Dorsal column stimulator identified with battery pack in the subcutaneous soft tissues of the left lower lumbar region. IMPRESSION: 1. No acute findings identified within the abdomen or pelvis. 2. Progressive atrophy of the left lobe of liver with diminished caliber of the portal vein to the left hepatic lobe. Etiology is indeterminate. Underlying infiltrative process such as cholangiocarcinoma cannot be excluded. When the patient is clinically stable and able to follow directions and hold their breath (preferably as an outpatient) further evaluation with dedicated contrast enhanced liver protocol MRI is recommended. 3. Chronic fibrotic interstitial lung disease. 4. Stable left adrenal nodule, likely benign adenoma. 5. Aortic Atherosclerosis (ICD10-I70.0). Electronically Signed   By: Kerby Moors M.D.   On: 03/31/2021 14:18    EKG: Independently reviewed. nsr  Assessment/Plan Active Problems:   Post-polio syndrome   Essential  hypertension   CAD S/P percutaneous coronary angioplasty   Duodenal stenosis   Bile duct obstruction   # Abnormal LFTs # Right upper quadrant abdominal pain Hx cholethiasis, s/p ERCP in 2020 with sphincterotomy and balloon sweeping, also history of duodenal stenosis s/p dilatation, presenting with 2 weeks worsening right upper quadrant pain. Labs show moderate transaminitis with LFTs in the 2-300s, t bili of 5.8. CT of abdomen shows "atrophy of left lobe of liver with diminished caliber of the portal vein to the left hepatic lobe. Etiology is indeterminate. Underlying infiltrative process such as cholangiocarcinoma cannot be excluded." - GI consulted, recs pending, they will direct w/u - continue zosyn for now - NPO, IVF @ 100  # CAD Hx nstemi, hx 3 stents last in 2005. Asymptomatic - hold statin given liver insult  # History a fib Per wife was in setting of acute illness. Followed by cardiology, is not anticoagulated - cont home metop  # History polio # Neuropathy - home gabapentin  DVT prophylaxis: lovenox Code Status: full  Family Communication: wife updated @ bedside  Consults called: GI   Level of care: Telemetry Medical Status is: Inpatient  Remains inpatient appropriate because: severity of illness        Desma Maxim MD [facility] Pager [phone]  If 7PM-7AM, please contact night-coverage [email or web address] Password [email or web address]  03/31/2021, 5:15 PM

## 2021-03-31 NOTE — ED Provider Notes (Signed)
[facility] EMERGENCY DEPARTMENT Provider Note   CSN: [id number] Arrival date & time: 03/31/21  1053     History Chief Complaint  Patient presents with   Abdominal Pain   Shortness of Breath    Alexander [name] is a 70 y.o. male.   Abdominal Pain Associated symptoms: chest pain, constipation, fatigue, nausea and shortness of breath   Associated symptoms: no chills, no cough, no diarrhea, no dysuria, no fever, no hematuria and no vomiting   Shortness of Breath Associated symptoms: abdominal pain and chest pain   Associated symptoms: no cough, no fever, no neck pain, no rash, no vomiting and no wheezing    Patient is a 70 year old male with a PMH significant for CAD, poliomyelitis, cholecystitis with sepsis, cholangitis, gallstones, HTN, fatty liver disease, prior EtOH use, ischemic cardiomyopathy, atrial fibrillation, and others as below who presents to the ED accompanied by his wife with complaints of abdominal pain.  Patient reports that he has had 1 week of progressive, intermittent, burning epigastric abdominal pain that radiates across his bilateral upper quadrants and into his back.  He has had associated nausea, constipation, and decreased p.o. intake.  He has had associated shortness of breath and pain radiating to his chest wall, associated with heartburn and burping.  He denies any fevers or chills, rashes, syncope, palpitations, abdominal distention, diarrhea, vomiting, melena or hematochezia, peripheral edema, or any other complaints.  He denies any known sick contacts.  Past Medical History:  Diagnosis Date   Acute cholecystitis 03/2016   with sepsis   Arthritis    CAD S/P percutaneous coronary angioplasty 1995, 2000, 2005   a. 1995 s/p BMS;  b. 2000 s/p BMS;  c. 2005 s/p stent - All stents in [facility] (RCA, LAD & OM - unknown on which date);  d. 04/2015 NSTEMI/Cath: LM nl, ost/p LAD 20%, patent mLAD stent, RI small, OM2 patent stent, pRCA 20%, patent  stent, EF 45-50%-->Med Rx.   Calculus of bile duct with acute cholangitis with obstruction    Chronic pain syndrome    a. Followed @ [facility];  b. Uses 12-14 excedrins per day.   Duodenal stricture    Essential hypertension    Fatty liver    Gallstones    GERD (gastroesophageal reflux disease)    History of depression    History of post poliomyelitis muscular atrophy    HLD (hyperlipidemia)    Ischemic cardiomyopathy    a. 04/2015 Echo: EF 40-45%, mod antsept, ant, antlat, apical HK, mild AI/MR, PASP 54mmHg.   Migraines    Neuropathy    due to post polio syndrome   NSTEMI (non-ST elevated myocardial infarction) ([place]) 04/2015   - [place] Ischemia in setting of Sepsis (also with prior MI history   OSA (obstructive sleep apnea)    does not want to use CPAP, using mouth guard   PAF (paroxysmal atrial fibrillation) ([place])    a. 04/2015 in setting of cholecystits and sepsis -->[place];  b. CHA2DS2VASc = 4--> Eliquis.   Pneumonia 04/2015   Post-polio syndrome    a. ambulates with braces.   Septic shock ([place]) 01/07/2018   Shoulder pain, right     Patient Active Problem List   Diagnosis Date Noted   Bile duct obstruction 03/31/2021   Skin rash 10/19/2020   Dyspnea on exertion 07/26/2020   Postural dizziness 07/18/2019   Health maintenance examination 11/29/2018   Duodenal stenosis 05/06/2018   S/P ERCP 05/06/2018   Constipation 05/06/2018  Interstitial lung disease ([place]) 01/22/2018   Calculus of bile duct with acute cholangitis with obstruction    GERD (gastroesophageal reflux disease) 11/13/2017   CAD S/P percutaneous coronary angioplasty    PAF (paroxysmal atrial fibrillation) ([place]); CHA2DS2VASc = 4--> Eliquis.    Ischemic cardiomyopathy    NSTEMI (non-ST elevated myocardial infarction) ([place]) 04/27/2015   Essential hypertension    Advanced care planning/counseling discussion 07/12/2014   Obesity, Class I, BMI 30-34.9 07/12/2014   Scapular dysfunction 06/12/2014    Medicare annual wellness visit, subsequent 06/29/2013   Vitamin D deficiency 03/29/2013   Fatigue 02/27/2013   Prediabetes 05/09/2012   Male sexual dysfunction 05/03/2012   Post-polio syndrome    Chronic pain syndrome    OSA (obstructive sleep apnea)    Migraine    Hyperlipidemia with target LDL less than 70     Past Surgical History:  Procedure Laterality Date   BALLOON DILATION N/A 06/06/2018   Procedure: BALLOON DILATION;  Surgeon: Irving Copas., MD;  Location: [place];  Service: Gastroenterology;  Laterality: N/A;   BALLOON DILATION N/A 07/06/2018   Procedure: BALLOON DILATION;  Surgeon: Rush Landmark Telford Nab., MD;  Location: [place];  Service: Gastroenterology;  Laterality: N/A;   BILIARY STENT PLACEMENT  01/08/2018   Procedure: BILIARY STENT PLACEMENT;  Surgeon: Jackquline Denmark, MD;  Location: [facility] ENDOSCOPY;  Service: Endoscopy;;   BILIARY STENT PLACEMENT N/A 02/09/2018   Procedure: BILIARY STENT PLACEMENT;  Surgeon: Irving Copas., MD;  Location: [facility] ENDOSCOPY;  Service: Gastroenterology;  Laterality: N/A;   BIOPSY  06/06/2018   Procedure: BIOPSY;  Surgeon: Rush Landmark Telford Nab., MD;  Location: [facility] ENDOSCOPY;  Service: Gastroenterology;;   CARDIAC CATHETERIZATION N/A 04/30/2015   Procedure: Left Heart Cath and Coronary Angiography;  Surgeon: Troy Sine, MD;  Location: [place] CV LAB;  Service: Cardiovascular; LM nl, ost/p LAD 20%, patent mLAD stent, RI small, OM2 patent stent, pRCA 20%, patent stent, EF 45-50%-->Med Rx    cervical spine stimulator  08/2012   CHOLECYSTECTOMY  06/10/2015   Procedure: LAPAROSCOPIC CHOLECYSTECTOMY;  Surgeon: Arta Bruce Kinsinger, MD;  Location: [place];  Service: General;;   COLONOSCOPY  09/2013   TA x2, rpt 5 yrs ([place])   COLONOSCOPY WITH ESOPHAGOGASTRODUODENOSCOPY (EGD)  06/2018   reactive gastropathy, tubular adenoma, rpt colonoscopy 5 yrs ([place])   COLONOSCOPY WITH PROPOFOL N/A 06/06/2018   Procedure: COLONOSCOPY  WITH PROPOFOL;  Surgeon: Irving Copas., MD;  Location: [place];  Service: Gastroenterology;  Laterality: N/A;   CORONARY ANGIOPLASTY WITH STENT PLACEMENT  1995, 2000, 2005   stents in RCA, LAD & Cx-OM. -Stents were patent by cardiac catheterization in November 2016   ENDOSCOPIC RETROGRADE CHOLANGIOPANCREATOGRAPHY (ERCP) WITH PROPOFOL N/A 02/09/2018   Procedure: ENDOSCOPIC RETROGRADE CHOLANGIOPANCREATOGRAPHY (ERCP) WITH PROPOFOL;  Surgeon: Irving Copas., MD;  Location: [facility] ENDOSCOPY;  Service: Gastroenterology;  Laterality: N/A;   ENDOSCOPIC RETROGRADE CHOLANGIOPANCREATOGRAPHY (ERCP) WITH PROPOFOL N/A 03/30/2018   Procedure: ENDOSCOPIC RETROGRADE CHOLANGIOPANCREATOGRAPHY (ERCP) WITH PROPOFOL;  Surgeon: Rush Landmark Telford Nab., MD;  Location: [facility] ENDOSCOPY;  Service: Gastroenterology;  Laterality: N/A;   ERCP N/A 01/08/2018   Procedure: ENDOSCOPIC RETROGRADE CHOLANGIOPANCREATOGRAPHY (ERCP);  Surgeon: Jackquline Denmark, MD;  Location: [facility] ENDOSCOPY;  Service: Endoscopy;  Laterality: N/A;   ESOPHAGOGASTRODUODENOSCOPY N/A 03/30/2018   Procedure: ESOPHAGOGASTRODUODENOSCOPY (EGD);  Surgeon: Irving Copas., MD;  Location: [facility] ENDOSCOPY;  Service: Gastroenterology;  Laterality: N/A;   ESOPHAGOGASTRODUODENOSCOPY (EGD) WITH PROPOFOL N/A 06/06/2018   Procedure: ESOPHAGOGASTRODUODENOSCOPY (EGD) WITH PROPOFOL;  Surgeon: Irving Copas., MD;  Location: [facility] ENDOSCOPY;  Service: Gastroenterology;  Laterality: N/A;   ESOPHAGOGASTRODUODENOSCOPY (EGD) WITH PROPOFOL N/A 07/06/2018   Procedure: ESOPHAGOGASTRODUODENOSCOPY (EGD) WITH PROPOFOL;  Surgeon: Rush Landmark Telford Nab., MD;  Location: [place];  Service: Gastroenterology;  Laterality: N/A;   lumbar spine stimulator     POLYPECTOMY  06/06/2018   Procedure: POLYPECTOMY;  Surgeon: Mansouraty, Telford Nab., MD;  Location: [place];  Service: Gastroenterology;;   REMOVAL OF STONES  03/30/2018   Procedure: REMOVAL OF STONES;  Surgeon:  Irving Copas., MD;  Location: [facility] ENDOS[facility];  Service: Gastroenterology;;   Joan Mayans  01/08/2018   Procedure: Joan Mayans;  Surgeon: Jackquline Denmark, MD;  Location: [place];  Service: Endoscopy;;   SPHINCTEROTOMY  02/09/2018   Procedure: Joan Mayans;  Surgeon: Mansouraty, Telford Nab., MD;  Location: [facility] ENDOS[facility];  Service: Gastroenterology;;   STENT REMOVAL  02/09/2018   Procedure: STENT REMOVAL;  Surgeon: Irving Copas., MD;  Location: [facility] ENDOSCOPY;  Service: Gastroenterology;;   Lavell Islam REMOVAL  03/30/2018   Procedure: STENT REMOVAL;  Surgeon: Irving Copas., MD;  Location: [facility] ENDOSCOPY;  Service: Gastroenterology;;   TRANSTHORACIC ECHOCARDIOGRAM  04/2015   EF 40-45%, mod antsept, ant, antlat, apical HK, mild AI/MR, PASP 53mmHg.   UVULOPALATOPHARYNGOPLASTY, TONSILLECTOMY AND SEPTOPLASTY  2002       Family History  Problem Relation Age of Onset   Diabetes Mother    Heart disease Mother    Cancer Sister 62       male cancer   Breast cancer Brother 70   Hypertension Maternal Aunt    Coronary artery disease Neg Hx    Stroke Neg Hx    Colon cancer Neg Hx    Pancreatic cancer Neg Hx    Stomach cancer Neg Hx    Esophageal cancer Neg Hx    Inflammatory bowel disease Neg Hx    Liver disease Neg Hx    Rectal cancer Neg Hx     Social History   Tobacco Use   Smoking status: Former    Types: Cigarettes    Quit date: 1972    Years since quitting: 50.8   Smokeless tobacco: Never   Tobacco comments:    was very light  Vaping Use   Vaping Use: Never used  Substance Use Topics   Alcohol use: Yes    Alcohol/week: 0.0 standard drinks    Comment: moderate   Drug use: No    Home Medications Prior to Admission medications   Medication Sig Start Date End Date Taking? Authorizing Provider  acetaminophen (TYLENOL) 500 MG tablet Take 1,000 mg by mouth every 6 (six) hours as needed for moderate pain or headache.    Yes [provider]   Ascorbic Acid (VITAMIN C) 1000 MG tablet Take 1,000 mg by mouth daily.   Yes [provider]  Carboxymethylcellulose Sodium (LUBRICANT EYE DROPS OP) Place 2 drops into both eyes daily as needed (for dry eyes).   Yes [provider]  Cholecalciferol (VITAMIN D3) 50 MCG (2000 UT) TABS Take 2,000 Units by mouth daily.   Yes [provider]  gabapentin (NEURONTIN) 600 MG tablet Take 2 tablets (1,200 mg total) by mouth 3 (three) times daily. 10/18/20  Yes [name], MD  ibuprofen (ADVIL) 200 MG tablet Take 400 mg by mouth every 6 (six) hours as needed for headache or moderate pain.   Yes [provider]  Magnesium 250 MG TABS Take 1 tablet (250 mg total) by mouth at bedtime. Patient taking differently: Take 250 mg  by mouth at bedtime. 11/11/17  Yes [name], MD  metoprolol tartrate (LOPRESSOR) 25 MG tablet TAKE 12.5 MG ( 1/2 TABLET) TWICE A DAY . MAY TAKE AN ADDITIONAL 25 MG IF NEEDED FOR PALPS. Patient taking differently: Take 12.5 mg by mouth 2 (two) times daily. 07/31/20  Yes Leonie Man, MD  Multiple Vitamin (MULTIVITAMIN WITH MINERALS) TABS tablet Take 1 tablet by mouth daily.   Yes [provider]  Omega-3 Fatty Acids (FISH OIL) 1000 MG CAPS Take 1,000 mg by mouth daily.    Yes [provider]  omeprazole (PRILOSEC) 40 MG capsule Take 1 capsule (40 mg total) by mouth daily. 10/18/20  Yes [name], MD  rosuvastatin (CRESTOR) 40 MG tablet Take 1 tablet (40 mg total) by mouth daily. 10/18/20  Yes [name], MD  sildenafil (VIAGRA) 100 MG tablet TAKE 1/2 TO 1 TABLET BY MOUTH DAILY AS NEEDED FOR ERECTILE DYSFUNCTION Patient taking differently: Take 50-100 mg by mouth daily as needed for erectile dysfunction. 10/18/20  Yes [name], MD  vitamin B-12 (CYANOCOBALAMIN) 1000 MCG tablet Take 1,000 mcg by mouth daily.   Yes [provider]  vitamin E 400 UNIT capsule Take 400 Units by mouth daily.   Yes  [provider]    Allergies    Patient has no known allergies.  Review of Systems   Review of Systems  Constitutional:  Positive for appetite change and fatigue. Negative for activity change, chills and fever.  HENT:  Negative for congestion and trouble swallowing.   Eyes:  Negative for photophobia.  Respiratory:  Positive for shortness of breath. Negative for cough and wheezing.   Cardiovascular:  Positive for chest pain. Negative for palpitations and leg swelling.  Gastrointestinal:  Positive for abdominal pain, constipation and nausea. Negative for abdominal distention, blood in stool, diarrhea and vomiting.  Genitourinary:  Negative for dysuria and hematuria.  Musculoskeletal:  Positive for back pain. Negative for neck pain and neck stiffness.  Skin:  Negative for color change, pallor and rash.  Neurological:  Positive for light-headedness. Negative for tremors, seizures, syncope, facial asymmetry, weakness and numbness.  Hematological:  Does not bruise/bleed easily.  Psychiatric/Behavioral:  Negative for confusion. The patient is not nervous/anxious.   All other systems reviewed and are negative.  Physical Exam Updated Vital Signs BP 115/73   Pulse 72   Temp 98.6 F (37 C)   Resp (!) 26   Wt 92.1 kg   SpO2 96%   BMI 29.34 kg/m   Physical Exam Vitals and nursing note reviewed.  Constitutional:      General: He is not in acute distress.    Appearance: Normal appearance. He is well-developed. He is obese. He is ill-appearing. He is not toxic-appearing or diaphoretic.  HENT:     Head: Normocephalic and atraumatic.     Right Ear: External ear normal.     Left Ear: External ear normal.     Nose: Nose normal.     Mouth/Throat:     Mouth: Mucous membranes are moist.     Pharynx: Oropharynx is clear.  Eyes:     General: No scleral icterus.    Extraocular Movements: Extraocular movements intact.     Conjunctiva/sclera: Conjunctivae normal.     Pupils: Pupils  are equal, round, and reactive to light.  Cardiovascular:     Rate and Rhythm: Normal rate and regular rhythm.     Heart sounds: Normal heart sounds. No murmur heard. Pulmonary:  Effort: Pulmonary effort is normal. No respiratory distress.     Breath sounds: Normal breath sounds.  Abdominal:     General: There is distension.     Palpations: Abdomen is soft. There is no hepatomegaly or splenomegaly.     Tenderness: There is abdominal tenderness in the right upper quadrant, epigastric area and left upper quadrant. There is guarding. There is no right CVA tenderness, left CVA tenderness or rebound. Positive signs include Murphy's sign. Negative signs include Rovsing's sign and McBurney's sign.     Hernia: No hernia is present.  Musculoskeletal:     Cervical back: Normal range of motion and neck supple. No rigidity or tenderness.  Lymphadenopathy:     Cervical: No cervical adenopathy.  Skin:    General: Skin is warm and dry.     Capillary Refill: Capillary refill takes less than 2 seconds.     Coloration: Skin is not jaundiced.  Neurological:     General: No focal deficit present.     Mental Status: He is alert and oriented to person, place, and time. Mental status is at baseline.     GCS: GCS eye subscore is 4. GCS verbal subscore is 5. GCS motor subscore is 6.     Motor: Atrophy present.     Comments: Post-poliomyelitis LE atrophy  Psychiatric:        Mood and Affect: Mood normal.        Behavior: Behavior normal. Behavior is cooperative.    ED Results / Procedures / Treatments   Labs (all labs ordered are listed, but only abnormal results are displayed) Labs Reviewed  CBC WITH DIFFERENTIAL/PLATELET - Abnormal; Notable for the following components:      Result Value   WBC 15.9 (*)    Neutro Abs 14.8 (*)    Lymphs Abs 0.6 (*)    Abs Immature Granulocytes 0.11 (*)    All other components within normal limits  COMPREHENSIVE METABOLIC PANEL - Abnormal; Notable for the following  components:   CO2 19 (*)    AST 236 (*)    ALT 367 (*)    Alkaline Phosphatase 158 (*)    Total Bilirubin 5.8 (*)    All other components within normal limits  LACTIC ACID, PLASMA - Abnormal; Notable for the following components:   Lactic Acid, Venous 2.1 (*)    All other components within normal limits  LACTIC ACID, PLASMA - Abnormal; Notable for the following components:   Lactic Acid, Venous 2.1 (*)    All other components within normal limits  TROPONIN I (HIGH SENSITIVITY) - Abnormal; Notable for the following components:   Troponin I (High Sensitivity) 20 (*)    All other components within normal limits  RESP PANEL BY RT-PCR (FLU A&B, COVID) ARPGX2  LIPASE, BLOOD  HEPATITIS PANEL, ACUTE  URINALYSIS, COMPLETE (UACMP) WITH MICROSCOPIC  PROTIME-INR  CBC  CEA  CANCER ANTIGEN 19-9  COMPREHENSIVE METABOLIC PANEL  PROTIME-INR  BILIRUBIN, DIRECT  TROPONIN I (HIGH SENSITIVITY)    EKG None  Radiology DG Chest 2 View  Result Date: 03/31/2021 CLINICAL DATA:  Shortness of breath. EXAM: CHEST - 2 VIEW COMPARISON:  Oct 18, 2020. FINDINGS: The heart size and mediastinal contours are within normal limits. Hypoinflation of the lungs is noted with mild bibasilar subsegmental atelectasis or scarring. The visualized skeletal structures are unremarkable. IMPRESSION: Hypoinflation of the lungs with mild bibasilar subsegmental atelectasis or scarring. Electronically Signed   By: Marijo Conception M.D.   On: 03/31/2021  11:56   CT ABDOMEN PELVIS W CONTRAST  Result Date: 03/31/2021 CLINICAL DATA:  Left upper quadrant pain, acute EXAM: CT ABDOMEN AND PELVIS WITH CONTRAST TECHNIQUE: Multidetector CT imaging of the abdomen and pelvis was performed using the standard protocol following bolus administration of intravenous contrast. CONTRAST:  162mL OMNIPAQUE IOHEXOL 350 MG/ML SOLN COMPARISON:  01/07/2018 FINDINGS: Lower chest: Chronic interstitial reticulation, ground-glass attenuation, and mild  traction bronchiectasis identified within the lung bases compatible with fibrotic interstitial lung disease. Hepatobiliary: Heterogeneous fatty deposition within the right hepatic lobe is identified. No focal liver lesion identified. There is progressive atrophy of the left lobe of liver since 01/07/2018. The portal vein to the left hepatic lobe is markedly diminished in caliber compared with the previous exam. Previous cholecystectomy. Intrahepatic bile duct dilatation is again noted. The common bile duct is increased in caliber measuring up to 1.4 cm. This is unchanged when compared with the previous exam. Several foci of pneumobilia identified within the left hepatic lobe and distal CBD. Pancreas: No focal pancreas lesion identified. No main duct dilatation or inflammation. Spleen: Normal in size without focal abnormality. Adrenals/Urinary Tract: Normal right adrenal gland. Left adrenal nodule measures 1.5 cm and is unchanged from previous exam, likely reflecting a benign adenoma. Posterior cortex left kidney cyst measures 1.6 cm. No nephrolithiasis or hydronephrosis. Urinary bladder is unremarkable. Stomach/Bowel: Stomach is within normal limits. Appendix appears normal. No evidence of bowel wall thickening, distention, or inflammatory changes. Vascular/Lymphatic: Aortic atherosclerosis. No aneurysm. No abdominopelvic adenopathy. Reproductive: Prostate is unremarkable. Other: No free fluid or fluid collections. Musculoskeletal: Multilevel lumbar degenerative disc disease. Bilateral L5 pars defects identified with mild anterolisthesis of L5 on S1. Dorsal column stimulator identified with battery pack in the subcutaneous soft tissues of the left lower lumbar region. IMPRESSION: 1. No acute findings identified within the abdomen or pelvis. 2. Progressive atrophy of the left lobe of liver with diminished caliber of the portal vein to the left hepatic lobe. Etiology is indeterminate. Underlying infiltrative process  such as cholangiocarcinoma cannot be excluded. When the patient is clinically stable and able to follow directions and hold their breath (preferably as an outpatient) further evaluation with dedicated contrast enhanced liver protocol MRI is recommended. 3. Chronic fibrotic interstitial lung disease. 4. Stable left adrenal nodule, likely benign adenoma. 5. Aortic Atherosclerosis (ICD10-I70.0). Electronically Signed   By: Kerby Moors M.D.   On: 03/31/2021 14:18   US Abdomen Limited RUQ (LIVER/GB)  Result Date: 03/31/2021 CLINICAL DATA:  Transaminitis. EXAM: ULTRASOUND ABDOMEN LIMITED RIGHT UPPER QUADRANT COMPARISON:  None. FINDINGS: Gallbladder: Removed. Common bile duct: Diameter: 14.2 mm Liver: Left lobe not well visualized. No focal lesion identified. Heterogeneous increased echotexture. Portal vein is patent on color Doppler imaging with normal direction of blood flow towards the liver. Other: None. IMPRESSION: 1. Heterogeneous liver with increased echotexture worrisome for diffuse hepatocellular disease. 2. Common bile duct is dilated measuring 14 mm this is similar to the prior CT. 3. Cholecystectomy. Electronically Signed   By: Ronney Asters M.D.   On: 03/31/2021 17:31    Procedures Procedures   Medications Ordered in ED Medications  metoprolol tartrate (LOPRESSOR) tablet 12.5 mg (12.5 mg Oral Given 03/31/21 2243)  pantoprazole (PROTONIX) EC tablet 40 mg (40 mg Oral Given 03/31/21 2244)  gabapentin (NEURONTIN) tablet 1,200 mg (1,200 mg Oral Given 03/31/21 2244)  enoxaparin (LOVENOX) injection 40 mg (40 mg Subcutaneous Given 03/31/21 1841)  0.9 %  sodium chloride infusion ( Intravenous New Bag/Given 03/31/21 2243)  ketorolac (TORADOL)  30 MG/ML injection 15 mg (15 mg Intravenous Given 03/31/21 1843)  piperacillin-tazobactam (ZOSYN) IVPB 3.375 g (has no administration in time range)  iohexol (OMNIPAQUE) 350 MG/ML injection 100 mL (100 mLs Intravenous Contrast Given 03/31/21 1349)  fentaNYL  (SUBLIMAZE) injection 50 mcg (50 mcg Intravenous Given 03/31/21 1606)  lactated ringers bolus 1,000 mL (0 mLs Intravenous Stopped 03/31/21 2152)  piperacillin-tazobactam (ZOSYN) IVPB 3.375 g (0 g Intravenous Stopped 03/31/21 1917)  LORazepam (ATIVAN) injection 1 mg (2 mg Intravenous Given 03/31/21 1915)    ED Course  I have reviewed the triage vital signs and the nursing notes.  Pertinent labs & imaging results that were available during my care of the patient were reviewed by me and considered in my medical decision making (see chart for details).    MDM Rules/Calculators/A&P                           Alexander [name] is a 70 y.o. male presenting with abdominal pain. Initial VS sig for HTN.  EKG interpretation: NSR, normal intervals, rate 80 bpm.  No ST elevations or depressions, voltage criteria for LVH.  Nonspecific ST changes, partial RBBB.  T wave inversions in leads II, aVF.  Labs: Hepatitis panel nonreactive.  COVID/flu negative.  Troponin of 13.  Lipase 22.  Lactic acid 2.1.  Leukocytosis with left shift.  CMP with new transaminitis, elevated T bili and alkaline phosphatase..   Imaging: CXR with mild hypoinflation of the lungs and bibasilar atelectasis.  CT without acute abdominal or pelvic findings.  Atrophied left lobe of the liver, chronic ILD, and stable left adrenal nodule.  RUQ ultrasound notable for heterogeneous liver concerning for possible hepatocellular disease and CBD dilated at 14 mm.. Imaging was reviewed by radiology and personally by me.  DDX considered: Ascending cholangitis, cholangiocarcinoma, bowel obstruction, hepatitis, pancreatitis, sepsis. History, examination, and objective data most consistent with new transaminitis, concerning for possible neoplastic disease versus cholangitis.  Final diagnosis pending MRCP.  Patient tolerating p.o. prior to arrival, no obstructive process noted.  Patient does not meet sepsis criteria at this time, but does appear ill on  exam.  Hepatitis panel negative and lipase WNL.  Low suspicion for other emergent surgical abdominal pathology at this time.  Afebrile and without systemic infectious symptoms.  Medications: Medications  metoprolol tartrate (LOPRESSOR) tablet 12.5 mg (12.5 mg Oral Given 03/31/21 2243)  pantoprazole (PROTONIX) EC tablet 40 mg (40 mg Oral Given 03/31/21 2244)  gabapentin (NEURONTIN) tablet 1,200 mg (1,200 mg Oral Given 03/31/21 2244)  enoxaparin (LOVENOX) injection 40 mg (40 mg Subcutaneous Given 03/31/21 1841)  0.9 %  sodium chloride infusion ( Intravenous New Bag/Given 03/31/21 2243)  ketorolac (TORADOL) 30 MG/ML injection 15 mg (15 mg Intravenous Given 03/31/21 1843)  piperacillin-tazobactam (ZOSYN) IVPB 3.375 g (has no administration in time range)  iohexol (OMNIPAQUE) 350 MG/ML injection 100 mL (100 mLs Intravenous Contrast Given 03/31/21 1349)  fentaNYL (SUBLIMAZE) injection 50 mcg (50 mcg Intravenous Given 03/31/21 1606)  lactated ringers bolus 1,000 mL (0 mLs Intravenous Stopped 03/31/21 2152)  piperacillin-tazobactam (ZOSYN) IVPB 3.375 g (0 g Intravenous Stopped 03/31/21 1917)  LORazepam (ATIVAN) injection 1 mg (2 mg Intravenous Given 03/31/21 1915)     GI consulted, recommendations pending.  Re-evaluated to admission.  Pain well controlled, patient made n.p.o. Hemodynamically stable and in no acute distress.  Admitted to hospitalist in stable condition.  Additional labs ordered, pending at the time of admission.  Follow-up pending  GI recommendations, MRCP pending. Patient understands and agrees with the plan.  Family updated at the bedside.    The plan for this patient was discussed with my attending physician, who voiced agreement and who oversaw evaluation and treatment of this patient.     Note: Estate manager/land agent was used in the creation of this note.  Final Clinical Impression(s) / ED Diagnoses Final diagnoses:  Transaminitis    Rx / DC Orders ED  Discharge Orders     None        Cherly Hensen, DO 03/31/21 2343    Lucrezia Starch, MD 04/04/21 [phone]

## 2021-03-31 NOTE — H&P (View-Only) (Signed)
Referring Provider: Emergency Services. PCP: Eustaquio Boyden, MD   Gastroenterologist: Erick Blinks, MD Reason for consultation:   abdominal pain                 ASSESSMENT / PLAN   #   70 yo male with leukocytosis with left shift,  recurrent acute epigastric / RUQ pain reminiscent of choledocholithiasis / cholangitis in 2019.   Liver enzymes markedly elevated in a mixed pattern.   CTAP >> progressive atrophy of left lobe of liver with diminished caliber of the PV to the left hepatic lobe of unclear significance.  Infiltrative process such as cholangiocarcinoma not excluded. There is intrahepatic duct dilation seen again and the CBD is up to 1.4 cm. Rule out recurrent choledocholithiasis. Also, history of CBD narrowing ( lower third of duct) s/p dilation.at time of ERCP in 2019. Cytology >> atypical cells. Rule out recurrent stenosis of bile duct.   --Leukocytosis possibly biliary source but just case I ordered a urinalysis. CXR is unrevealing --MRCP today --ERCP tomorrow --Zosyn already ordered.  --INR  --am LFTs, CBC --CA19-9, CEA  # History of acquired duodenal stenosis requiring dilation x 2 in 2020. He seems to be asymptomatic at this point.   # SOB, chest pain last pm. Symptoms probably due to significant pain. High sensitivity troponin mildly elevated at 20. No clear ST elevation / depression on EKG --Recommend trending troponin.   # Additional medical history listed below.   HISTORY OF PRESENT ILLNESS                                                                                                                         Chief Complaint:  abdominal pain   Alexander [name] is a 70 y.o. male with a past medical history significant for duodenal stricture requiring dilation, cholecystectomy, choledocholithiasis s/p sphincterotomy, HTN, interstitial lung disease, sleep apnea, adenomatous colon polyps.  See PMH for any additional medical history.    ED course: Patient presented to  ED today with abdominal pain and SOB. The epigastric / RUQ pain started about two weeks ago. Initially it was intermittent but it now constant. The pain feels exactly like when he was hospitalized with choledocholithiasis / cholangitis. No fevers at home. Nausea with the pain yesterday but no vomiting. No fevers. Last night the pain was radiating up into his chest and he has had some associated shortness of breath. No major findings on CXR. WBC 15.9 with left shift. AST 236, ALT 367, alk phos 158, Tbili 5.8, high sensitivity troponin 20  CTAP w/ contrast No acute findings identified within the abdomen or pelvis.. Progressive atrophy of the left lobe of liver with diminished caliber of the portal vein to the left hepatic lobe. Etiology is indeterminate. Underlying infiltrative process such as cholangiocarcinoma cannot be excluded.   Imaging:  DG Chest 2 View  Result Date: 03/31/2021 CLINICAL DATA:  Shortness of breath. EXAM: CHEST - 2 VIEW COMPARISON:  Oct 18, 2020. FINDINGS:  The heart size and mediastinal contours are within normal limits. Hypoinflation of the lungs is noted with mild bibasilar subsegmental atelectasis or scarring. The visualized skeletal structures are unremarkable. IMPRESSION: Hypoinflation of the lungs with mild bibasilar subsegmental atelectasis or scarring. Electronically Signed   By: Marijo Conception M.D.   On: 03/31/2021 11:56    PREVIOUS ENDOSCOPIC EVALUATIONS  / IMAGING STUDIES   ERCP for cholangitis and bile duct stones -Duodenal stenosis. - Ascending cholangitis due to multiple filling defects consistent with a stones and sludge status post limited biliary sphincterotomy and endobiliary stent insertion with good drainage  ERCP for cholangitis follow up Sept 2019 -J-shaped deformity in the entire stomach. - Acquired duodenal stenosis just distal to the ampulla, precluding stable positioning. - Prior biliary sphincterotomy appeared open. - One partially occluded stent  from the biliary tree was seen in the major papilla. This was removed. - The fluoroscopic examination was suspicious for sludge. - The entire main bile duct was moderately dilated, with an obstruction. - Choledocholithiasis/sludge balls were found. Partial removal was accomplished with biliarysphincterotomy extension, sphincteroplasty and balloon sweep with stent insertion at the end of the procedure   ERCP for follow up on cholangitis and biliary stent removal October 2019 --No gross lesions in esophagus. - J-shaped deformity in the entire stomach. - No gross lesions in the duodenal bulb. - Acquired duodenal stenosis. - Prior biliary sphincterotomy appeared open. - One partially occluded stent from the biliary tree was seen in the major papilla. This stent was removed. - A filling defect consistent with a stone and sludge was seen on the cholangiogram. - A mild narrowing was found in the lower third of the main bile duct. The stricture was inflammatory appearing. This region was able to be traversed with a fully inflated retrieval balloon. This area was dilated and then brushed for cells for cytology evaluation. - The upper third of the main bile duct and middle third of the main bile duct were moderately dilated. - Choledocholithiasis and biliary sludge/debris was found. Complete removal was accomplished by balloon sphincteroplasty and then significant balloon sweeping  ** cytology- BILE DUCT BRUSHING DISTAL,STENT(SPECIMEN 1 OF 1 COLLECTED 03/30/18):ATYPICAL CELLS PRESENT, SEE COMMENT.   EGD Jan 2020 treatment of duodenal stenosis -A few, white nummular lesions in esophageal mucosa but otherwise normal. Biopsied for EoE and Candida rule out. - Z-line regular, 40 cm from the incisors. - J-shaped deformity in the entire stomach. - No other gross lesions in the stomach. Biopsied for HP. - Normal duodenal bulb. - Acquired duodenal stenosis. Biopsied. Dilated to 16.5 mm. - Patent  sphincterotomy, characterized by healthy appearing mucosa was found. - No other gross lesions in the second portion of the duodenum and in the third portion of the duodenum. - Biopsies were taken with a cold forceps for histology in the duodenal bulb, in the first portion of the duodenum, in the second portion of the duodenum and in the third portion of the duodenum for Celiac/Enteropathy rule out.  Polyp Surveillance colonoscopy January 2020  Complete exam.BBPS 8 Non-thrombosed internal hemorrhoids and perianal erythema found on digital rectal exam. - The examined portion of the ileum was normal. Biopsied. - One 2 mm polyp at the hepatic flexure, removed with a jumbo cold forceps. Resected and retrieved. - Normal mucosa in the entire examined colon. Biopsied. - Normal mucosa in the rectum. Biopsied. - Non-bleeding non-thrombosed internal hemorrhoids.  Duodenum, Biopsy - BENIGN DUODENAL MUCOSA - NO ACUTE INFLAMMATION, VILLOUS BLUNTING OR INCREASED INTRAEPITHELIAL  LYMPHOCYTES 2. Duodenum, Biopsy, Stricture - BENIGN DUODENAL MUCOSA - NO ACUTE INFLAMMATION, VILLOUS BLUNTING OR INCREASED INTRAEPITHELIAL LYMPHOCYTES 3. Stomach, biopsy - REACTIVE GASTROPATHY WITH FEATURES CONSISTENT WITH PROTON PUMP INHIBITOR EFFECT - NO H. PYLORI OR INTESTINAL METAPLASIA IDENTIFIED - SEE COMMENT 4. Esophagus, biopsy - REACTIVE SQUAMOUS MUCOSA WITH INCREASED INTRAEPITHELIAL LYMPHOCYTES - SEE COMMENT 5. Ileum, biopsy, Terminal Ileum - SMALL BOWEL MUCOSA WITH LYMPHOID AGGREGATES - NO ACUTE INFLAMMATION, GRANULOMAS OR MALIGNANCY IDENTIFIED 6. Colon, biopsy, Random - BENIGN COLONIC MUCOSA - NO ACTIVE INFLAMMATION OR EVIDENCE OF MICROSCOPIC COLITIS - NO HIGH GRADE DYSPLASIA OR MALIGNANCY IDENTIFIED 7. Colon, polyp(s), Hepatic Flexure - TUBULAR ADENOMA (1 OF 2 FRAGMENTS) - BENIGN COLONIC MUCOSA (1 OF 2 FRAGMENTS) - NO HIGH GRADE DYSPLASIA OR MALIGNANCY IDENTIFIED 8. Rectum, biopsy - BENIGN COLONIC MUCOSA -  NO ACTIVE INFLAMMATION OR EVIDENCE OF MICROSCOPIC COLITIS - NO HIGH GRADE DYSPLASIA OR MALIGNANCY IDENTIFIED 1 of  **Stain negative for H.pylori  EGD March 2022 follow up on stenosis No gross lesions in esophagus. - J-shaped gastric deformity. - Acquired duodenal stenosis just proximal to the ampulla. Dilated to 18 mm sequentially. - Patent sphincterotomy was found. - Mild deformity in the angle/turns of the D2/D3 region - not clear if a result of adhesive disease. However, normal mucosa was found in the second and third portions of the duodenu     Past Medical History:  Diagnosis Date   Acute cholecystitis 03/2016   with sepsis   Arthritis    CAD S/P percutaneous coronary angioplasty 1995, 2000, 2005   a. 1995 s/p BMS;  b. 2000 s/p BMS;  c. 2005 s/p stent - All stents in [facility] (RCA, LAD & OM - unknown on which date);  d. 04/2015 NSTEMI/Cath: LM nl, ost/p LAD 20%, patent mLAD stent, RI small, OM2 patent stent, pRCA 20%, patent stent, EF 45-50%-->Med Rx.   Calculus of bile duct with acute cholangitis with obstruction    Chronic pain syndrome    a. Followed @ [facility];  b. Uses 12-14 excedrins per day.   Duodenal stricture    Essential hypertension    Fatty liver    Gallstones    GERD (gastroesophageal reflux disease)    History of depression    History of post poliomyelitis muscular atrophy    HLD (hyperlipidemia)    Ischemic cardiomyopathy    a. 04/2015 Echo: EF 40-45%, mod antsept, ant, antlat, apical HK, mild AI/MR, PASP .   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 04/2015 in setting of cholecystits and sepsis -->Amio;  b. CHA2DS2VASc = 4--> Eliquis.   Pneumonia 04/2015    Post-polio syndrome    a. ambulates with braces.   Septic shock (HCC) 01/07/2018   Shoulder pain, right     Past Surgical History:  Procedure Laterality Date   BALLOON DILATION N/A 06/06/2018   Procedure: BALLOON DILATION;  Surgeon: Meridee Score Netty Starring., MD;  Location: [facility] ENDOSCOPY;  Service: Gastroenterology;  Laterality: N/A;   BALLOON DILATION N/A 07/06/2018   Procedure: BALLOON DILATION;  Surgeon: Meridee Score Netty Starring., MD;  Location: [facility] ENDOSCOPY;  Service: Gastroenterology;  Laterality: N/A;   BILIARY STENT PLACEMENT  01/08/2018   Procedure: BILIARY STENT PLACEMENT;  Surgeon: Jackquline Denmark, MD;  Location: [facility] ENDOSCOPY;  Service: Endoscopy;;   BILIARY STENT PLACEMENT N/A 02/09/2018   Procedure: BILIARY STENT PLACEMENT;  Surgeon: Irving Copas., MD;  Location: [facility] ENDOSCOPY;  Service: Gastroenterology;  Laterality: N/A;   BIOPSY  06/06/2018   Procedure: BIOPSY;  Surgeon: Rush Landmark Telford Nab., MD;  Location: [facility] ENDOSCOPY;  Service: Gastroenterology;;   CARDIAC CATHETERIZATION N/A 04/30/2015   Procedure: Left Heart Cath and Coronary Angiography;  Surgeon: Troy Sine, MD;  Location: [place] CV LAB;  Service: Cardiovascular; LM nl, ost/p LAD 20%, patent mLAD stent, RI small, OM2 patent stent, pRCA 20%, patent stent, EF 45-50%-->Med Rx    cervical spine stimulator  08/2012   CHOLECYSTECTOMY  06/10/2015   Procedure: LAPAROSCOPIC CHOLECYSTECTOMY;  Surgeon: Arta Bruce Kinsinger, MD;  Location: [place];  Service: General;;   COLONOSCOPY  09/2013   TA x2, rpt 5 yrs ([place])   COLONOSCOPY WITH ESOPHAGOGASTRODUODENOSCOPY (EGD)  06/2018   reactive gastropathy, tubular adenoma, rpt colonoscopy 5 yrs (Mansouraty)   COLONOSCOPY WITH PROPOFOL N/A 06/06/2018   Procedure: COLONOSCOPY WITH PROPOFOL;  Surgeon: Irving Copas., MD;  Location: [place];  Service: Gastroenterology;  Laterality: N/A;   CORONARY ANGIOPLASTY WITH STENT PLACEMENT  1995, 2000, 2005   stents in RCA, LAD &  Cx-OM. -Stents were patent by cardiac catheterization in November 2016   ENDOSCOPIC RETROGRADE CHOLANGIOPANCREATOGRAPHY (ERCP) WITH PROPOFOL N/A 02/09/2018   Procedure: ENDOSCOPIC RETROGRADE CHOLANGIOPANCREATOGRAPHY (ERCP) WITH PROPOFOL;  Surgeon: Irving Copas., MD;  Location: [facility] ENDOSCOPY;  Service: Gastroenterology;  Laterality: N/A;   ENDOSCOPIC RETROGRADE CHOLANGIOPANCREATOGRAPHY (ERCP) WITH PROPOFOL N/A 03/30/2018   Procedure: ENDOSCOPIC RETROGRADE CHOLANGIOPANCREATOGRAPHY (ERCP) WITH PROPOFOL;  Surgeon: Rush Landmark Telford Nab., MD;  Location: [facility] ENDOSCOPY;  Service: Gastroenterology;  Laterality: N/A;   ERCP N/A 01/08/2018   Procedure: ENDOSCOPIC RETROGRADE CHOLANGIOPANCREATOGRAPHY (ERCP);  Surgeon: Jackquline Denmark, MD;  Location: [facility] ENDOSCOPY;  Service: Endoscopy;  Laterality: N/A;   ESOPHAGOGASTRODUODENOSCOPY N/A 03/30/2018   Procedure: ESOPHAGOGASTRODUODENOSCOPY (EGD);  Surgeon: Irving Copas., MD;  Location: [facility] ENDOSCOPY;  Service: Gastroenterology;  Laterality: N/A;   ESOPHAGOGASTRODUODENOSCOPY (EGD) WITH PROPOFOL N/A 06/06/2018   Procedure: ESOPHAGOGASTRODUODENOSCOPY (EGD) WITH PROPOFOL;  Surgeon: Rush Landmark Telford Nab., MD;  Location: [place];  Service: Gastroenterology;  Laterality: N/A;   ESOPHAGOGASTRODUODENOSCOPY (EGD) WITH PROPOFOL N/A 07/06/2018   Procedure: ESOPHAGOGASTRODUODENOSCOPY (EGD) WITH PROPOFOL;  Surgeon: Rush Landmark Telford Nab., MD;  Location: [place];  Service: Gastroenterology;  Laterality: N/A;   lumbar spine stimulator     POLYPECTOMY  06/06/2018   Procedure: POLYPECTOMY;  Surgeon: Mansouraty, Telford Nab., MD;  Location: [place];  Service: Gastroenterology;;   REMOVAL OF STONES  03/30/2018   Procedure: REMOVAL OF STONES;  Surgeon: Irving Copas., MD;  Location: [facility] ENDOSCOPY;  Service: Gastroenterology;;   Joan Mayans  01/08/2018   Procedure: Joan Mayans;  Surgeon: Jackquline Denmark, MD;  Location: [place];  Service:  Endoscopy;;   SPHINCTEROTOMY  02/09/2018   Procedure: Joan Mayans;  Surgeon: Mansouraty, Telford Nab., MD;  Location: [facility] ENDOSCOPY;  Service: Gastroenterology;;   STENT REMOVAL  02/09/2018   Procedure: STENT REMOVAL;  Surgeon: Irving Copas., MD;  Location: [facility] ENDOSCOPY;  Service: Gastroenterology;;   Lavell Islam REMOVAL  03/30/2018   Procedure: STENT REMOVAL;  Surgeon: Irving Copas., MD;  Location: [facility] ENDOSCOPY;  Service: Gastroenterology;;   TRANSTHORACIC ECHOCARDIOGRAM  04/2015   EF 40-45%, mod antsept, ant, antlat, apical HK, mild AI/MR, PASP 100mmHg.   UVULOPALATOPHARYNGOPLASTY, TONSILLECTOMY AND SEPTOPLASTY  2002    Prior to Admission medications   Medication Sig Start Date End Date Taking? Authorizing Provider  acetaminophen (TYLENOL) 500 MG tablet Take 1,000 mg by mouth every 6 (six) hours as needed for moderate pain or headache.     [provider]  Ascorbic Acid (VITAMIN C) 1000 MG tablet Take 1,000 mg by mouth daily.    [provider]  Carboxymethylcellulose Sodium (LUBRICANT EYE DROPS OP) Place 2 drops into both eyes daily as needed (for dry eyes).    [provider]  Cholecalciferol (VITAMIN D3) 50 MCG (2000 UT) TABS Take 2,000 Units by mouth daily.    [provider]  gabapentin (NEURONTIN) 600 MG tablet Take 2 tablets (1,200 mg total) by mouth 3 (three) times daily. 10/18/20   [name], MD  Magnesium 250 MG TABS Take 1 tablet (250 mg total) by mouth at bedtime. Patient taking differently: Take 250 mg by mouth at bedtime. 11/11/17   [name], MD  metoprolol tartrate (LOPRESSOR) 25 MG tablet TAKE 12.5 MG ( 1/2 TABLET) TWICE A DAY . MAY TAKE AN ADDITIONAL 25 MG IF NEEDED FOR PALPS. 07/31/20   Leonie Man, MD  Multiple Vitamin (MULTIVITAMIN WITH MINERALS) TABS tablet Take 1 tablet by mouth daily.    [provider]  Omega-3 Fatty Acids (FISH OIL) 1000 MG CAPS Take 1,000 mg by mouth daily.     [provider]  omeprazole (PRILOSEC) 40 MG capsule Take 1 capsule (40 mg total) by mouth daily. 10/18/20   [name], MD  rosuvastatin (CRESTOR) 40 MG tablet Take 1 tablet (40 mg total) by mouth daily. 10/18/20   [name], MD  sildenafil (VIAGRA) 100 MG tablet TAKE 1/2 TO 1 TABLET BY MOUTH DAILY AS NEEDED FOR ERECTILE DYSFUNCTION 10/18/20   [name], MD  vitamin B-12 (CYANOCOBALAMIN) 1000 MCG tablet Take 1,000 mcg by mouth daily.    [provider]  vitamin E 400 UNIT capsule Take 400 Units by mouth daily.    [provider]    Current Facility-Administered Medications  Medication Dose Route Frequency Provider Last Rate Last Admin   HYDROcodone-acetaminophen (NORCO/VICODIN) 5-325 MG per tablet 1 tablet  1 tablet Oral Once Evlyn Courier, PA-C       Current Outpatient Medications  Medication Sig Dispense Refill   acetaminophen (TYLENOL) 500 MG tablet Take 1,000 mg by mouth every 6 (six) hours as needed for moderate pain or headache.      Ascorbic Acid (VITAMIN C) 1000 MG tablet Take 1,000 mg by mouth daily.     Carboxymethylcellulose Sodium (LUBRICANT EYE DROPS OP) Place 2 drops into both eyes daily as needed (for dry eyes).     Cholecalciferol (VITAMIN D3) 50 MCG (2000 UT) TABS Take 2,000 Units by mouth daily.     gabapentin (NEURONTIN) 600 MG tablet Take 2 tablets (1,200 mg total) by mouth 3 (three) times daily. 540 tablet 3   Magnesium 250 MG TABS Take 1 tablet (250 mg total) by mouth at bedtime. (Patient taking differently: Take 250 mg by mouth at bedtime.)  0   metoprolol tartrate (LOPRESSOR) 25 MG tablet TAKE 12.5 MG ( 1/2 TABLET) TWICE A DAY . MAY TAKE AN ADDITIONAL 25 MG IF NEEDED FOR PALPS. 120 tablet 3   Multiple Vitamin (MULTIVITAMIN WITH MINERALS) TABS tablet Take 1 tablet by mouth daily.     Omega-3 Fatty Acids (FISH OIL) 1000 MG CAPS Take 1,000 mg by mouth daily.  omeprazole (PRILOSEC) 40 MG capsule Take 1 capsule (40 mg total) by mouth  daily. 90 capsule 3   rosuvastatin (CRESTOR) 40 MG tablet Take 1 tablet (40 mg total) by mouth daily. 90 tablet 3   sildenafil (VIAGRA) 100 MG tablet TAKE 1/2 TO 1 TABLET BY MOUTH DAILY AS NEEDED FOR ERECTILE DYSFUNCTION 6 tablet 0   vitamin B-12 (CYANOCOBALAMIN) 1000 MCG tablet Take 1,000 mcg by mouth daily.     vitamin E 400 UNIT capsule Take 400 Units by mouth daily.      Allergies as of 03/31/2021   (No Known Allergies)    Family History  Problem Relation Age of Onset   Diabetes Mother    Heart disease Mother    Cancer Sister 64       male cancer   Breast cancer Brother 53   Hypertension Maternal Aunt    Coronary artery disease Neg Hx    Stroke Neg Hx    Colon cancer Neg Hx    Pancreatic cancer Neg Hx    Stomach cancer Neg Hx    Esophageal cancer Neg Hx    Inflammatory bowel disease Neg Hx    Liver disease Neg Hx    Rectal cancer Neg Hx     Social History   Socioeconomic History   Marital status: Married    Spouse name: Not on file   Number of children: 3   Years of education: Not on file   Highest education level: Not on file  Occupational History   Occupation: retired  Tobacco Use   Smoking status: Former    Types: Cigarettes    Quit date: 1972    Years since quitting: 50.8   Smokeless tobacco: Never   Tobacco comments:    was very light  Vaping Use   Vaping Use: Never used  Substance and Sexual Activity   Alcohol use: Yes    Alcohol/week: 0.0 standard drinks    Comment: moderate   Drug use: No   Sexual activity: Yes  Other Topics Concern   Not on file  Social History Narrative   Caffeine: 1 cup soda, occasional coffee   Lives with wife [name]) and 23 yo son, 4 dogs   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   OBJECTIVE    Physical Exam: Vital signs in last 24 hours: Temp:  [99.1 F (37.3 C)] 99.1 F (37.3 C) (10/31 1115) Pulse Rate:  [77] 77 (10/31 1115) Resp:  [18] 18 (10/31 1115) BP: (106)/(75) 106/75 (10/31 1115) SpO2:  [95 %] 95 % (10/31 1115)    General:  Alert male in NAD Psych:  Pleasant, cooperative. Normal mood and affect Eyes: Pupils equal, no icterus. Conjunctive pink Ears:  Normal auditory acuity Nose: No deformity, discharge or lesions Neck:  Supple, no masses felt Lungs:  Clear to auscultation.  Heart:  Regular rate, regular rhythm. No lower extremity edema Abdomen:  Soft, nondistended, mild epigastric / RUQ tenderness,  active bowel sounds, no masses felt Rectal :  Deferred Msk: Symmetrical without gross deformities.  Neurologic:  Alert, oriented, grossly normal neurologically Skin:  Intact without significant lesions.    Scheduled inpatient medications  HYDROcodone-acetaminophen  1 tablet Oral Once      Intake/Output from previous day: No intake/output data recorded. Intake/Output this shift: No intake/output data recorded.   Lab Results: Recent Labs    03/31/21 1123  WBC 15.9*  HGB 14.7  HCT 42.8  PLT 231   BMET No results for input(s): NA, K, CL, CO2, GLUCOSE, BUN, CREATININE, CALCIUM in the last 72 hours. LFTs No results for input(s): PROT, ALBUMIN, AST, ALT, ALKPHOS, BILITOT, BILIDIR, IBILI in the last 72 hours. PT/INR No results for input(s): LABPROT, INR in the last 72 hours. Hepatitis Panel No results for input(s): HEPBSAG, HCVAB, HEPAIGM, HEPBIGM in the last 72 hours.   . CBC Latest Ref Rng & Units 03/31/2021 10/11/2020 01/06/2019  WBC 4.0 - 10.5 K/uL 15.9(H) 7.7  7.7  Hemoglobin 13.0 - 17.0 g/dL 14.7 14.8 15.1  Hematocrit 39.0 - 52.0 % 42.8 43.0 44.2  Platelets 150 - 400 K/uL 231 238.0 274.0    . CMP Latest Ref Rng & Units 10/11/2020 01/06/2019 11/24/2018  Glucose 70 - 99 mg/dL 121(H) 105(H) 108(H)  BUN 6 - 23 mg/dL $Remove'21 22 20  'VHOhIQD$ Creatinine 0.40 - 1.50 mg/dL 0.65 0.62 0.64  Sodium 135 - 145 mEq/L 143 138 142  Potassium 3.5 - 5.1 mEq/L 4.4 4.2 4.3  Chloride 96 - 112 mEq/L 105 102 106  CO2 19 - 32 mEq/L $Remove'29 29 28  '[id number]$ Calcium 8.4 - 10.5 mg/dL 9.3 9.8 8.9  Total Protein 6.0 - 8.3 g/dL 7.5 7.7 -  Total Bilirubin 0.2 - 1.2 mg/dL 0.5 0.7 -  Alkaline Phos 39 - 117 U/L 52 66 -  AST 0 - 37 U/L 16 21 -  ALT 0 - 53 U/L 19 34 -     Active Problems:   * No active hospital problems. Tye Savoy, NP-C @  03/31/2021, 12:39 PM

## 2021-03-31 NOTE — Telephone Encounter (Signed)
Inbound call from patient wife states patient is on his way to [facility] via ambulance for what believes to be a blockage. She states he is feeling how he felt a couple years ago when Dr. Rush Landmark did a procedure.

## 2021-04-01 ENCOUNTER — Encounter (HOSPITAL_COMMUNITY): Payer: Self-pay | Admitting: Obstetrics and Gynecology

## 2021-04-01 ENCOUNTER — Telehealth: Payer: Self-pay

## 2021-04-01 ENCOUNTER — Other Ambulatory Visit: Payer: Self-pay

## 2021-04-01 ENCOUNTER — Encounter (HOSPITAL_COMMUNITY)
Admission: EM | Disposition: A | Discharge status: Home / Self Care | Payer: Self-pay | Source: Home / Self Care | Attending: Internal Medicine

## 2021-04-01 ENCOUNTER — Inpatient Hospital Stay (HOSPITAL_COMMUNITY): Payer: Medicare Other | Admitting: Certified Registered Nurse Anesthetist

## 2021-04-01 ENCOUNTER — Inpatient Hospital Stay (HOSPITAL_COMMUNITY): Payer: Medicare Other

## 2021-04-01 DIAGNOSIS — K8030 Calculus of bile duct with cholangitis, unspecified, without obstruction: Secondary | ICD-10-CM

## 2021-04-01 DIAGNOSIS — K8309 Other cholangitis: Secondary | ICD-10-CM | POA: Diagnosis not present

## 2021-04-01 DIAGNOSIS — K831 Obstruction of bile duct: Secondary | ICD-10-CM

## 2021-04-01 DIAGNOSIS — R7401 Elevation of levels of liver transaminase levels: Secondary | ICD-10-CM | POA: Insufficient documentation

## 2021-04-01 DIAGNOSIS — T8859XA Other complications of anesthesia, initial encounter: Secondary | ICD-10-CM

## 2021-04-01 HISTORY — PX: ERCP: SHX5425

## 2021-04-01 HISTORY — PX: BILIARY STENT PLACEMENT: SHX5538

## 2021-04-01 HISTORY — PX: BIOPSY: SHX5522

## 2021-04-01 HISTORY — DX: Other complications of anesthesia, initial encounter: T88.59XA

## 2021-04-01 LAB — URINALYSIS, COMPLETE (UACMP) WITH MICROSCOPIC
Glucose, UA: NEGATIVE mg/dL
Ketones, ur: 5 mg/dL — AB
Leukocytes,Ua: NEGATIVE
Nitrite: NEGATIVE
Protein, ur: 30 mg/dL — AB
Specific Gravity, Urine: >1.046 — ABNORMAL HIGH (ref 1.005–1.030)
pH: 5 (ref 5.0–8.0)

## 2021-04-01 LAB — COMPREHENSIVE METABOLIC PANEL
ALT: 247 U/L — ABNORMAL HIGH (ref 0–44)
AST: 106 U/L — ABNORMAL HIGH (ref 15–41)
Albumin: 3.3 g/dL — ABNORMAL LOW (ref 3.5–5.0)
Alkaline Phosphatase: 123 U/L (ref 38–126)
Anion gap: 10 (ref 5–15)
BUN: 24 mg/dL — ABNORMAL HIGH (ref 8–23)
CO2: 21 mmol/L — ABNORMAL LOW (ref 22–32)
Calcium: 8.9 mg/dL (ref 8.9–10.3)
Chloride: 104 mmol/L (ref 98–111)
Creatinine, Ser: 0.78 mg/dL (ref 0.61–1.24)
GFR, Estimated: >60 mL/min (ref >60)
Glucose, Bld: 89 mg/dL (ref 70–99)
Potassium: 3.5 mmol/L (ref 3.5–5.1)
Sodium: 135 mmol/L (ref 135–145)
Total Bilirubin: 4.5 mg/dL — ABNORMAL HIGH (ref 0.3–1.2)
Total Protein: 7 g/dL (ref 6.5–8.1)

## 2021-04-01 LAB — BILIRUBIN, DIRECT: Bilirubin, Direct: 1.9 mg/dL — ABNORMAL HIGH (ref 0.0–0.2)

## 2021-04-01 LAB — CBC
HCT: 39 % (ref 39.0–52.0)
Hemoglobin: 13.3 g/dL (ref 13.0–17.0)
MCH: 32.7 pg (ref 26.0–34.0)
MCHC: 34.1 g/dL (ref 30.0–36.0)
MCV: 95.8 fL (ref 80.0–100.0)
Platelets: 146 10*3/uL — ABNORMAL LOW (ref 150–400)
RBC: 4.07 MIL/uL — ABNORMAL LOW (ref 4.22–5.81)
RDW: 13.2 % (ref 11.5–15.5)
WBC: 19.8 10*3/uL — ABNORMAL HIGH (ref 4.0–10.5)
nRBC: 0 % (ref 0.0–0.2)

## 2021-04-01 LAB — PROTIME-INR
INR: 1.2 (ref 0.8–1.2)
Prothrombin Time: 15 seconds (ref 11.4–15.2)

## 2021-04-01 SURGERY — ERCP, WITH INTERVENTION IF INDICATED
Anesthesia: General

## 2021-04-01 MED ORDER — LORAZEPAM 2 MG/ML IJ SOLN: 1.0000 mg | Freq: Once | INTRAMUSCULAR | Status: DC

## 2021-04-01 MED ORDER — LIDOCAINE 2% (20 MG/ML) 5 ML SYRINGE
INTRAMUSCULAR | Status: DC | PRN
  Administered 2021-04-01: 100 mg via INTRAVENOUS

## 2021-04-01 MED ORDER — SODIUM CHLORIDE 0.9 % IV SOLN
INTRAVENOUS | Status: DC | PRN
  Administered 2021-04-01: 25 mL

## 2021-04-01 MED ORDER — INDOMETHACIN 50 MG RE SUPP
RECTAL | Status: DC | PRN
  Administered 2021-04-01: 100 mg via RECTAL

## 2021-04-01 MED ORDER — SODIUM CHLORIDE 0.9 % IV SOLN: INTRAVENOUS | Status: AC

## 2021-04-01 MED ORDER — FENTANYL CITRATE (PF) 100 MCG/2ML IJ SOLN
INTRAMUSCULAR | Status: AC
  Filled 2021-04-01: qty 2

## 2021-04-01 MED ORDER — INDOMETHACIN 50 MG RE SUPP
RECTAL | Status: AC
  Filled 2021-04-01: qty 2

## 2021-04-01 MED ORDER — ROCURONIUM BROMIDE 10 MG/ML (PF) SYRINGE
PREFILLED_SYRINGE | INTRAVENOUS | Status: DC | PRN
  Administered 2021-04-01: 70 mg via INTRAVENOUS

## 2021-04-01 MED ORDER — LACTATED RINGERS IV SOLN: INTRAVENOUS | Status: DC

## 2021-04-01 MED ORDER — DEXAMETHASONE SODIUM PHOSPHATE 10 MG/ML IJ SOLN
INTRAMUSCULAR | Status: DC | PRN
  Administered 2021-04-01: 5 mg via INTRAVENOUS

## 2021-04-01 MED ORDER — SUGAMMADEX SODIUM 200 MG/2ML IV SOLN
INTRAVENOUS | Status: DC | PRN
  Administered 2021-04-01: 200 mg via INTRAVENOUS

## 2021-04-01 MED ORDER — CIPROFLOXACIN IN D5W 400 MG/200ML IV SOLN
INTRAVENOUS | Status: AC
  Filled 2021-04-01: qty 200

## 2021-04-01 MED ORDER — ONDANSETRON HCL 4 MG/2ML IJ SOLN
INTRAMUSCULAR | Status: DC | PRN
  Administered 2021-04-01: 4 mg via INTRAVENOUS

## 2021-04-01 MED ORDER — FENTANYL CITRATE (PF) 100 MCG/2ML IJ SOLN
INTRAMUSCULAR | Status: DC | PRN
  Administered 2021-04-01: 100 ug via INTRAVENOUS

## 2021-04-01 MED ORDER — SODIUM CHLORIDE 0.9 % IV SOLN: INTRAVENOUS | Status: DC

## 2021-04-01 MED ORDER — GLUCAGON HCL RDNA (DIAGNOSTIC) 1 MG IJ SOLR
INTRAMUSCULAR | Status: AC
  Filled 2021-04-01: qty 1

## 2021-04-01 MED ORDER — GLUCAGON HCL RDNA (DIAGNOSTIC) 1 MG IJ SOLR
INTRAMUSCULAR | Status: DC | PRN
  Administered 2021-04-01 (×2): .25 mg via INTRAVENOUS

## 2021-04-01 MED ORDER — PROPOFOL 10 MG/ML IV BOLUS
INTRAVENOUS | Status: DC | PRN
  Administered 2021-04-01: 150 mg via INTRAVENOUS

## 2021-04-01 MED ORDER — PHENYLEPHRINE 40 MCG/ML (10ML) SYRINGE FOR IV PUSH (FOR BLOOD PRESSURE SUPPORT)
PREFILLED_SYRINGE | INTRAVENOUS | Status: DC | PRN
  Administered 2021-04-01 (×3): 80 ug via INTRAVENOUS

## 2021-04-01 NOTE — Telephone Encounter (Signed)
-----   Message from Irving Copas., MD sent at 04/01/2021  4:36 PM EDT ----- RG, Thanks for doing his case and taking care of him. Were the stones sludge stones? Hard to believe he had persistent biliary calcium stones after last procedure and cleanout. I have not seen him for quite a while and his wife told me he had been doing pretty well so I was quite surprised by hearing about him yesterday. As well, what is your sense of the duodenal stricture and whether we need to stretch that out again? If he has an appointment already scheduled would leave that in place. Not sure what to make of the atrophy however. Tahjir Silveria, please schedule this patient an ERCP with spyglass in 6 to 8 weeks with me. Thanks. GM ----- Message ----- From: Jackquline Denmark, MD Sent: 04/01/2021   4:31 PM EDT To: Irving Copas., MD, Sharyn Creamer, MD  [facility] Gabe, This pt had asc cholangitis d/t recurrent CBD stones. Did ERCP with plastic stent today. He has appt with you in 2 weeks.  Will need rpt ERCP What do you think of L liver lobe atrophy on CT? - Looks like chronic ischemia - rather than cholangioCa Ca19-9 pending   RG

## 2021-04-01 NOTE — Anesthesia Postprocedure Evaluation (Signed)
Anesthesia Post Note  Patient: SERGEI DELO  Procedure(s) Performed: ENDOSCOPIC RETROGRADE CHOLANGIOPANCREATOGRAPHY (ERCP) BILIARY STENT PLACEMENT     Anesthesia Type: General Level of consciousness: awake and alert Pain management: pain level controlled Vital Signs Assessment: post-procedure vital signs reviewed and stable Respiratory status: spontaneous breathing, nonlabored ventilation and respiratory function stable Cardiovascular status: blood pressure returned to baseline and stable Postop Assessment: no apparent nausea or vomiting Anesthetic complications: no   No notable events documented.  Last Vitals:  Vitals:   04/01/21 1540 04/01/21 1610  BP: 125/90 (!) 154/90  Pulse: 90 84  Resp: (!) 24 18  Temp: 36.7 C   SpO2: 97% 99%    Last Pain:  Vitals:   04/01/21 1610  TempSrc:   PainSc: 0-No pain                 Lidia Collum

## 2021-04-01 NOTE — Anesthesia Preprocedure Evaluation (Signed)
Anesthesia Evaluation  Patient identified by MRN, date of birth, ID band Patient awake    Reviewed: Allergy & Precautions, NPO status , Patient's Chart, lab work & pertinent test results, reviewed documented beta blocker date and time   History of Anesthesia Complications Negative for: history of anesthetic complications  Airway Mallampati: I  TM Distance: >3 FB Neck ROM: Full    Dental  (+) Edentulous Upper, Missing, Poor Dentition   Pulmonary sleep apnea , former smoker,    Pulmonary exam normal        Cardiovascular hypertension, Pt. on medications and Pt. on home beta blockers + CAD, + Past MI, + Cardiac Stents and +CHF  Normal cardiovascular exam+ dysrhythmias Atrial Fibrillation    Echo 08/23/20: EF 55-60%, mod LVH, g1dd, normal RVSF, mild AR   Neuro/Psych Post polio syndrome Spinal cord stimulator    GI/Hepatic Neg liver ROS, GERD  ,cholangitis   Endo/Other  negative endocrine ROS  Renal/GU negative Renal ROS  negative genitourinary   Musculoskeletal  (+) Arthritis ,   Abdominal   Peds  Hematology negative hematology ROS (+)   Anesthesia Other Findings   Reproductive/Obstetrics                             Anesthesia Physical Anesthesia Plan  ASA: 3  Anesthesia Plan: General   Post-op Pain Management:    Induction: Intravenous  PONV Risk Score and Plan: 2 and Ondansetron, Dexamethasone, Treatment may vary due to age or medical condition and Midazolam  Airway Management Planned: Oral ETT  Additional Equipment: None  Intra-op Plan:   Post-operative Plan: Extubation in OR  Informed Consent: I have reviewed the patients History and Physical, chart, labs and discussed the procedure including the risks, benefits and alternatives for the proposed anesthesia with the patient or authorized representative who has indicated his/her understanding and acceptance.     Dental  advisory given  Plan Discussed with:   Anesthesia Plan Comments:         Anesthesia Quick Evaluation

## 2021-04-01 NOTE — Op Note (Addendum)
[facility] Patient Name: Alexander [name] Procedure Date : 04/01/2021 MRN: [id number] Attending MD: Jackquline Denmark , MD Date of Birth: 02/04/1951 CSN: [id number] Age: 70 Admit Type: Inpatient Procedure:                ERCP Indications:              RUQ pain/jaundice/fever with chills with abn LFTs                            s/o ascending cholangitis. H/O choledocholithiasis                            2018/2019 s/p biliary                            sphincterotomy/sphincteroplasty. H/O                            cholecystectomy. Pt with duodenal stricture s/p                            dil. CT A/P shows progressive atrophy of the left                            liver lobe and diminished caliber of the portal                            vein to the left hepatic lobe, chronic intrahepatic                            bile duct dilation, and increased CBD dilation.                            Patient's last ERCP in 2019 did show a CBD                            stricture that was brushed for atypical cells. MRCP                            is not able to be performed d/t spinal cord                            stimulator. Providers:                Jackquline Denmark, MD, Jeanella Cara, RN, ,                            Luan Moore, Technician Referring MD:              Medicines:                General Anesthesia Complications:            No immediate complications. Estimated Blood Loss:     Estimated blood loss: none. Procedure:                Pre-Anesthesia Assessment:                           -  Prior to the procedure, a History and Physical                            was performed, and patient medications and                            allergies were reviewed. [name] tolerance of                            previous anesthesia was also reviewed. The risks                            and benefits of the procedure and the sedation                            options and  risks were discussed with the patient.                            All questions were answered, and informed consent                            was obtained. Prior Anticoagulants: The patient has                            taken no previous anticoagulant or antiplatelet                            agents. ASA Grade Assessment: III - A patient with                            severe systemic disease. After reviewing the risks                            and benefits, the patient was deemed in                            satisfactory condition to undergo the procedure.                           After obtaining informed consent, the scope was                            passed under direct vision. Throughout the                            procedure, the patient's blood pressure, pulse, and                            oxygen saturations were monitored continuously. The                            [id number]-Q180V ([id number] Olympus duodenoscope was  introduced through the mouth, and used to inject                            contrast into and used to inject contrast into the                            bile duct. The ERCP was performed with moderate                            difficulty. The patient tolerated the procedure                            well. Scope In: Scope Out: Findings:      Side-viewing ERCP scope could not be passed to the second portion of       duodenum in prone position as stomach was J-shaped. The scope was passed       to the major papilla in left lateral position. A duodenal stricture with       luminal diameter of approximately 10 mm was noted just distal to the       major papilla. ERCP scope could not be passed beyond.      A scout film was obtained. Surgical clips, consistent with a previous       cholecystectomy, were noted. There was previous sphincterotomy with       minor post sphincterotomy stenosis. The bile duct was deeply cannulated       with the  short-nosed traction sphincterotome. This resulted in drainage       of pus mixed with bile. CBD was dilated at 14 mm. Multiple (atleast 4)       filling defects were noted in the mid and distal CBD c/w stones/sludge.       Care was taken not to over-distend the bile duct d/t significant pus as       to avoid sepsis.      We decided to place biliary stent to avoid prolonging the procedure. One       10 Fr by 7 cm plastic stent with a single internal flap was placed 6 cm       into the common bile duct. Bile and pus flowed through the stent. The       stent was in good position confirmed by fluoroscopy. The duodenal mucosa       was erythematous. 2 biopsies were obtained.      Pancreatic duct was intentionally not cannulated or injected. Impression:               - Ascending cholangitis s/p Endo biliary stenting                           - Choledocholithiasis was found. Removal was not                            attempted; a stent was inserted.                           - J-shaped stomach.                           -  Duodenal stenosis.                           - Previous cholecystectomy. Recommendation:           - Return patient to hospital ward for ongoing care.                           - Clear liquid diet today. Advance diet as                            tolerated.                           - Continue Zosyn IV for now. Trend CBC. Follow BC.                            Would need A/Bs for 10 days.                           - Await pathology results.                           - He would require repeat ERCP (+/- EUS, +/- SPY)                            with my partner Dr. Rush Landmark in 6-8 weeks. I do                            believe left liver lobe atrophy is likely ischemic                            in etiology rather than neoplastic. Follow CA 19?9                           - Watch for pancreatitis, bleeding, perforation,                            and cholangitis.                            - The findings and recommendations were discussed                            with the patient's family. Procedure Code(s):        --- Professional ---                           [phone], Endoscopic retrograde                            cholangiopancreatography (ERCP); with placement of                            endoscopic stent into biliary or pancreatic duct,  including pre- and post-dilation and guide wire                            passage, when performed, including sphincterotomy,                            when performed, each stent                           [id number], 45, Endoscopic retrograde                            cholangiopancreatography (ERCP); with biopsy,                            single or multiple                           74328, Endoscopic catheterization of the biliary                            ductal system, radiological supervision and                            interpretation Diagnosis Code(s):        --- Professional ---                           K83.1, Obstruction of bile duct                           K80.30, Calculus of bile duct with cholangitis,                            unspecified, without obstruction                           K83.09, Other cholangitis CPT copyright 2019 [employer]. All rights reserved. The codes documented in this report are preliminary and upon coder review may  be revised to meet current compliance requirements. Jackquline Denmark, MD 04/01/2021 3:51:58 PM This report has been signed electronically. Number of Addenda: 0

## 2021-04-01 NOTE — Telephone Encounter (Signed)
Mansouraty, Telford Nab., MD  Jackquline Denmark, MD; Timothy Lasso, RN RG,  Thanks for update.  Kashira Behunin, please put in 2-1/2-hour to 3-hour ERCP for this patient.  Thanks.  GM

## 2021-04-01 NOTE — Anesthesia Procedure Notes (Signed)
Procedure Name: Intubation Date/Time: 04/01/2021 2:17 PM Performed by: Ardyth Harps, CRNA Pre-anesthesia Checklist: Patient identified, Emergency Drugs available, Suction available and Patient being monitored Patient Re-evaluated:Patient Re-evaluated prior to induction Oxygen Delivery Method: Circle System Utilized Preoxygenation: Pre-oxygenation with 100% oxygen Induction Type: IV induction Ventilation: Mask ventilation without difficulty Laryngoscope Size: Mac and 4 Grade View: Grade I Tube type: Oral Tube size: 7.5 mm Number of attempts: 1 Airway Equipment and Method: Stylet and Oral airway Placement Confirmation: ETT inserted through vocal cords under direct vision, positive ETCO2 and breath sounds checked- equal and bilateral Secured at: 23 cm Tube secured with: Tape Dental Injury: Teeth and Oropharynx as per pre-operative assessment

## 2021-04-01 NOTE — ED Notes (Addendum)
ED TO INPATIENT HANDOFF REPORT  ED Nurse Name and Phone #: [place] [phone]  S Name/Age/Gender Alexander [name] 70 y.o. male Room/Bed: 037C/037C  Code Status   Code Status: Full Code  Home/SNF/Other Home Patient oriented to: self, place, time, and situation Is this baseline? Yes   Triage Complete: Triage complete  Chief Complaint Bile duct obstruction [K83.1]  Triage Note EMS stated, Upper right abdominal pain , started last night with some SOB  , pt had his gallbladder out a few years back but he says it feels like that.    Allergies No Known Allergies  Level of Care/Admitting Diagnosis ED Disposition     ED Disposition  Admit   Condition  --   Comment  Hospital Area: [facility] [[id number]]  Level of Care: Telemetry Medical [104]  May admit patient to [facility] or [facility] if equivalent level of care is available:: No  Covid Evaluation: Asymptomatic Screening Protocol (No Symptoms)  Diagnosis: Bile duct obstruction [956387]  Admitting Physician: Eston Esters  Attending Physician: Gwynne Edinger [place]  Estimated length of stay: past midnight tomorrow  Certification:: I certify this patient will need inpatient services for at least 2 midnights          B Medical/Surgery History Past Medical History:  Diagnosis Date   Acute cholecystitis 03/2016   with sepsis   Arthritis    CAD S/P percutaneous coronary angioplasty 1995, 2000, 2005   a. 1995 s/p BMS;  b. 2000 s/p BMS;  c. 2005 s/p stent - All stents in [facility] (RCA, LAD & OM - unknown on which date);  d. 04/2015 NSTEMI/Cath: LM nl, ost/p LAD 20%, patent mLAD stent, RI small, OM2 patent stent, pRCA 20%, patent stent, EF 45-50%-->Med Rx.   Calculus of bile duct with acute cholangitis with obstruction    Chronic pain syndrome    a. Followed @ [facility];  b. Uses 12-14 excedrins per day.   Duodenal stricture    Essential hypertension    Fatty liver     Gallstones    GERD (gastroesophageal reflux disease)    History of depression    History of post poliomyelitis muscular atrophy    HLD (hyperlipidemia)    Ischemic cardiomyopathy    a. 04/2015 Echo: EF 40-45%, mod antsept, ant, antlat, apical HK, mild AI/MR, PASP 78mmHg.   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 04/2015 in setting of cholecystits and sepsis -->Amio;  b. CHA2DS2VASc = 4--> Eliquis.   Pneumonia 04/2015   Post-polio syndrome    a. ambulates with braces.   Septic shock ([place]) 01/07/2018   Shoulder pain, right    Past Surgical History:  Procedure Laterality Date   BALLOON DILATION N/A 06/06/2018   Procedure: BALLOON DILATION;  Surgeon: Rush Landmark Telford Nab., MD;  Location: [place];  Service: Gastroenterology;  Laterality: N/A;   BALLOON DILATION N/A 07/06/2018   Procedure: BALLOON DILATION;  Surgeon: Rush Landmark Telford Nab., MD;  Location: [place];  Service: Gastroenterology;  Laterality: N/A;   BILIARY STENT PLACEMENT  01/08/2018   Procedure: BILIARY STENT PLACEMENT;  Surgeon: Jackquline Denmark, MD;  Location: [facility]  ENDOSCOPY;  Service: Endoscopy;;   BILIARY STENT PLACEMENT N/A 02/09/2018   Procedure: BILIARY STENT PLACEMENT;  Surgeon: Irving Copas., MD;  [facility] ENDOSCOPY;  Service: Gastroenterology;  Laterality: N/A;   BIOPSY  06/06/2018   Procedure: BIOPSY;  Surgeon: Rush Landmark Telford Nab., MD;  Location: [facility] ENDOSCOPY;  Service: Gastroenterology;;   CARDIAC CATHETERIZATION N/A 04/30/2015   Procedure: Left Heart Cath and Coronary Angiography;  Surgeon: Troy Sine, MD;  Location: [facility];  Service: Cardiovascular; LM nl, ost/p LAD 20%, patent mLAD stent, RI small, OM2  patent stent, pRCA 20%, patent stent, EF 45-50%-->Med Rx    cervical spine stimulator  08/2012   CHOLECYSTECTOMY  06/10/2015   Procedure: LAPAROSCOPIC CHOLECYSTECTOMY;  Surgeon: Arta Bruce Kinsinger, MD;  Location: [place];  Service: General;;   COLONOSCOPY  09/2013   TA x2, rpt 5 yrs ([place])   COLONOSCOPY WITH ESOPHAGOGASTRODUODENOSCOPY (EGD)  06/2018   reactive gastropathy, tubular adenoma, rpt colonoscopy 5 yrs ([place])   COLONOSCOPY WITH PROPOFOL N/A 06/06/2018   Procedure: COLONOSCOPY WITH PROPOFOL;  Surgeon: Irving Copas., MD;  Location: [place];  Service: Gastroenterology;  Laterality: N/A;   CORONARY ANGIOPLASTY WITH STENT PLACEMENT  1995, 2000, 2005   stents in RCA, LAD & Cx-OM. -Stents were patent by cardiac catheterization in November 2016   ENDOSCOPIC RETROGRADE CHOLANGIOPANCREATOGRAPHY (ERCP) WITH PROPOFOL N/A 02/09/2018   Procedure: ENDOSCOPIC RETROGRADE CHOLANGIOPANCREATOGRAPHY (ERCP) WITH PROPOFOL;  Surgeon: Irving Copas., MD;  Location[facility];  Service: Gastroenterology;  Laterality: N/A;   ENDOSCOPIC RETROGRADE CHOLANGIOPANCREATOGRAPHY (ERCP) WITH PROPOFOL N/A 03/30/2018   Procedure: ENDOSCOPIC RETROGRADE CHOLANGIOPANCREATOGRAPHY (ERCP) WITH PROPOFOL;  Surgeon: Rush Landmark Telford Nab., MD;  [facility]OS[facility];  Service: Gastroenterology;  Laterality: N/A;   ERCP N/A 01/08/2018   Procedure: ENDOSCOPIC RETROGRADE CHOLANGIOPANCREATOGRAPHY (ERCP);  Surgeon: Jackquline Denmark, MD;  Location: [facility] ENDOSCOPY;  Service: Endoscopy;  Laterality: N/A;   ESOPHAGOGASTRODUODENOSCOPY N/A 03/30/2018   Procedure: ESOPHAGOGASTRODUODENOSCOPY (EGD);  Surgeon: Irving Copas., MD;  Location: [facility];  Service: Gastroenterology;  Laterality: N/A;   ESOPHAGOGASTRODUODENOSCOPY (EGD) WITH PROPOFOL N/A 06/06/2018   Procedure: ESOPHAGOGASTRODUODENOSCOPY (EGD) WITH PROPOFOL;  Surgeon: Rush Landmark Telford Nab., MD;  Location: [place];  Service: Gastroenterology;   Laterality: N/A;   ESOPHAGOGASTRODUODENOSCOPY (EGD) WITH PROPOFOL N/A 07/06/2018   Procedure: ESOPHAGOGASTRODUODENOSCOPY (EGD) WITH PROPOFOL;  Surgeon: Rush Landmark Telford Nab., MD;  Location: [place];  Service: Gastroenterology;  Laterality: N/A;   lumbar spine stimulator     POLYPECTOMY  06/06/2018   Procedure: POLYPECTOMY;  Surgeon: Mansouraty, Telford Nab., MD;  Location: [place];  Service: Gastroenterology;;   REMOVAL OF STONES  03/30/2018   Procedure: REMOVAL OF STONES;  Surgeon: Irving Copas., MD;  Location: [facility];  Service: Gastroenterology;;   Joan Mayans  01/08/2018   Procedure: Joan Mayans;  Surgeon: Jackquline Denmark, MD;  Location: [place];  Service: Endoscopy;;   SPHINCTEROTOMY  02/09/2018   Procedure: Joan Mayans;  Surgeon: Mansouraty, Telford Nab., MD;  Location: [facility];  Service: Gastroenterology;;   STENT REMOVAL  02/09/2018   Procedure: STENT REMOVAL;  Surgeon: Irving Copas., MD;  [facility] ENDOS[facility];  Service: Gastroenterology;;   Lavell Islam REMOVAL  03/30/2018   Procedure: STENT REMOVAL;  Surgeon: Irving Copas., MD;  [facility] ENDOSCOPY;  Service: Gastroenterology;;   TRANSTHORACIC ECHOCARDIOGRAM  04/2015   EF 40-45%, mod antsept, ant, antlat, apical HK, mild AI/MR, PASP 14mmHg.   UVULOPALATOPHARYNGOPLASTY, TONSILLECTOMY AND SEPTOPLASTY  2002     A IV Location/Drains/Wounds Patient Lines/Drains/Airways Status  Active Line/Drains/Airways     Name Placement date Placement time Site Days   Peripheral IV 03/31/21 Left;Lateral Wrist 03/31/21  1349  Wrist  1   Open Drain Right Abdomen 04/25/15  1000  Abdomen  2168   GI Stent 10 Fr. 02/09/18  1015  --  1147   Incision - 4 Ports Abdomen 1: Umbilicus 2: Superior 3: Right;Lateral;Upper 4: Right;Lateral;Lower 06/10/15  1444  -- 2122            Intake/Output Last 24 hours No intake or output data in the 24 hours ending 04/01/21 1109  Labs/Imaging Results for  orders placed or performed during the hospital encounter of 03/31/21 (from the past 48 hour(s))  CBC with Differential     Status: Abnormal   Collection Time: 03/31/21 11:23 AM  Result Value Ref Range   WBC 15.9 (H) 4.0 - 10.5 K/uL   RBC 4.49 4.22 - 5.81 MIL/uL   Hemoglobin 14.7 13.0 - 17.0 g/dL   HCT 42.8 39.0 - 52.0 %   MCV 95.3 80.0 - 100.0 fL   MCH 32.7 26.0 - 34.0 pg   MCHC 34.3 30.0 - 36.0 g/dL   RDW 12.9 11.5 - 15.5 %   Platelets 231 150 - 400 K/uL   nRBC 0.0 0.0 - 0.2 %   Neutrophils Relative % 92 %   Neutro Abs 14.8 (H) 1.7 - 7.7 K/uL   Lymphocytes Relative 4 %   Lymphs Abs 0.6 (L) 0.7 - 4.0 K/uL   Monocytes Relative 3 %   Monocytes Absolute 0.4 0.1 - 1.0 K/uL   Eosinophils Relative 0 %   Eosinophils Absolute 0.0 0.0 - 0.5 K/uL   Basophils Relative 0 %   Basophils Absolute 0.0 0.0 - 0.1 K/uL   Immature Granulocytes 1 %   Abs Immature Granulocytes 0.11 (H) 0.00 - 0.07 K/uL    Comment: Performed at [facility], [address], [place], [place] [address]  Comprehensive metabolic panel     Status: Abnormal   Collection Time: 03/31/21 11:23 AM  Result Value Ref Range   Sodium 136 135 - 145 mmol/L   Potassium 3.7 3.5 - 5.1 mmol/L   Chloride 103 98 - 111 mmol/L   CO2 19 (L) 22 - 32 mmol/L   Glucose, Bld 94 70 - 99 mg/dL    Comment: Glucose reference range applies only to samples taken after fasting for at least 8 hours.   BUN 17 8 - 23 mg/dL   Creatinine, Ser 0.83 0.61 - 1.24 mg/dL   Calcium 9.5 8.9 - 10.3 mg/dL   Total Protein 7.5 6.5 - 8.1 g/dL   Albumin 3.9 3.5 - 5.0 g/dL   AST 236 (H) 15 - 41 U/L   ALT 367 (H) 0 - 44 U/L   Alkaline Phosphatase 158 (H) 38 - 126 U/L   Total Bilirubin 5.8 (H) 0.3 - 1.2 mg/dL   GFR, Estimated >60 >60 mL/min    Comment: (NOTE) Calculated using the CKD-EPI Creatinine Equation (2021)    Anion gap 14 5 - 15    Comment: Performed at [place] [address] [place],  [address]  Lipase, blood     Status: None    Collection Time: 03/31/21 11:23 AM  Result Value Ref Range   Lipase 22 11 - 51 U/L    Comment: Performed at [facility] [place] [address], [place], [place] [address]  Troponin I (High Sensitivity)  Status: Abnormal   Collection Time: 03/31/21 11:23 AM  Result Value Ref Range   Troponin I (High Sensitivity) 20 (H) <18 ng/L    Comment: (NOTE) Elevated high sensitivity troponin I (hsTnI) values and significant  changes across serial measurements may suggest ACS but many other  chronic and acute conditions are known to elevate hsTnI results.  Refer to the "Links" section for chest pain algorithms and additional  guidance. Performed at [facility], [place] [address] [place], [place] [address]   Lactic acid, plasma     Status: Abnormal   Collection Time: 03/31/21  3:41 PM  Result Value Ref Range   Lactic Acid, Venous 2.1 (HH) 0.5 - 1.9 mmol/L    Comment: CRITICAL RESULT CALLED TO, READ BACK BY AND VERIFIED WITH: Junius Argyle RN BY SSTEPHENS [id number] Performed at [facility], [place] [address] [place], [place] [address]   Troponin I (High Sensitivity)     Status: None   Collection Time: 03/31/21  3:46 PM  Result Value Ref Range   Troponin I (High Sensitivity) 13 <18 ng/L    Comment: (NOTE) Elevated high sensitivity troponin I (hsTnI) values and significant  changes across serial measurements may suggest ACS but many other  chronic and acute conditions are known to elevate hsTnI results.  Refer to the "Links" section for chest pain algorithms and additional  guidance. Performed at [facility], [place] [address] [place], [place] [address]   Resp Panel by RT-PCR (Flu A&B, Covid) Nasopharyngeal Swab     Status: None   Collection Time: 03/31/21  3:48 PM   Specimen: Nasopharyngeal Swab; Nasopharyngeal(NP) swabs in vial transport medium  Result Value Ref Range   SARS Coronavirus 2 by RT PCR NEGATIVE NEGATIVE    Comment: (NOTE) SARS-CoV-2 target nucleic acids are  NOT DETECTED.  The SARS-CoV-2 RNA is generally detectable in upper respiratory specimens during the acute phase of infection. The lowest concentration of SARS-CoV-2 viral copies this assay can detect is 138 copies/mL. A negative result does not preclude SARS-Cov-2 infection and should not be used as the sole basis for treatment or other patient management decisions. A negative result may occur with  improper specimen collection/handling, submission of specimen other than nasopharyngeal swab, presence of viral mutation(s) within the areas targeted by this assay, and inadequate number of viral copies(<138 copies/mL). A negative result must be combined with clinical observations, patient history, and epidemiological information. The expected result is Negative.  Fact Sheet for Patients:  EntrepreneurPulse.com.au  Fact Sheet for Healthcare Providers:  IncredibleEmployment.be  This test is no t yet approved or cleared by the [place] FDA and  has been authorized for detection and/or diagnosis of SARS-CoV-2 by FDA under an Emergency Use Authorization (EUA). This EUA will remain  in effect (meaning this test can be used) for the duration of the COVID-19 declaration under Section 564(b)(1) of the Act, 21 U.S.C.section 360bbb-3(b)(1), unless the authorization is terminated  or revoked sooner.       Influenza A by PCR NEGATIVE NEGATIVE   Influenza B by PCR NEGATIVE NEGATIVE    Comment: (NOTE) The Xpert Xpress SARS-CoV-2/FLU/RSV plus assay is intended as an aid in the diagnosis of influenza from Nasopharyngeal swab specimens and should not be used as a sole basis for treatment. Nasal washings and aspirates are unacceptable for Xpert Xpress SARS-CoV-2/FLU/RSV testing.  Fact Sheet for Patients: EntrepreneurPulse.com.au  Fact Sheet for Healthcare Providers: IncredibleEmployment.be  This test is not yet approved  or cleared by the [place] and has been authorized for detection and/or diagnosis of SARS-CoV-2 by FDA under an Emergency Use Authorization (EUA). This EUA will remain in effect (meaning this test can be used) for the duration of the COVID-19 declaration under Section 564(b)(1) of the Act, 21 U.S.C. section 360bbb-3(b)(1), unless the authorization is terminated or revoked.  Performed at [facility], [place] [address], [place], [place] [address]   Hepatitis panel, acute     Status: None   Collection Time: 03/31/21  4:12 PM  Result Value Ref Range   Hepatitis B Surface Ag NON REACTIVE NON REACTIVE   HCV Ab NON REACTIVE NON REACTIVE    Comment: (NOTE) Nonreactive HCV antibody screen is consistent with no HCV infections,  unless recent infection is suspected or other evidence exists to indicate HCV infection.     Hep A IgM NON REACTIVE NON REACTIVE   Hep B C IgM NON REACTIVE NON REACTIVE    Comment: Performed at [facility], [place] [address] [place], [place] [address]  Lactic acid, plasma     Status: Abnormal   Collection Time: 03/31/21  7:00 PM  Result Value Ref Range   Lactic Acid, Venous 2.1 (HH) 0.5 - 1.9 mmol/L    Comment: CRITICAL VALUE NOTED.  VALUE IS CONSISTENT WITH PREVIOUSLY REPORTED AND CALLED VALUE. Performed at [facility], [place] [address], [place], [place] [address]   Urinalysis, Complete w Microscopic     Status: Abnormal   Collection Time: 04/01/21  3:38 AM  Result Value Ref Range   Color, Urine AMBER (A) YELLOW    Comment: BIOCHEMICALS MAY BE AFFECTED BY COLOR   APPearance CLEAR CLEAR   Specific Gravity, Urine >1.046 (H) 1.005 - 1.030   pH 5.0 5.0 - 8.0   Glucose, UA NEGATIVE NEGATIVE mg/dL   Hgb urine dipstick SMALL (A) NEGATIVE   Bilirubin Urine SMALL (A) NEGATIVE   Ketones, ur 5 (A) NEGATIVE mg/dL   Protein, ur 30 (A) NEGATIVE mg/dL   Nitrite NEGATIVE NEGATIVE   Leukocytes,Ua NEGATIVE NEGATIVE   RBC / HPF 6-10 0 - 5 RBC/hpf    WBC, UA 6-10 0 - 5 WBC/hpf   Bacteria, UA RARE (A) NONE SEEN   Squamous Epithelial / LPF 0-5 0 - 5   Mucus PRESENT    Non Squamous Epithelial 0-5 (A) NONE SEEN    Comment: Performed at [facility], [place] [address] [place], [place] [address]  CBC Once     Status: Abnormal   Collection Time: 04/01/21  3:38 AM  Result Value Ref Range   WBC 19.8 (H) 4.0 - 10.5 K/uL   RBC 4.07 (L) 4.22 - 5.81 MIL/uL   Hemoglobin 13.3 13.0 - 17.0 g/dL   HCT 39.0 39.0 - 52.0 %   MCV 95.8 80.0 - 100.0 fL   MCH 32.7 26.0 - 34.0 pg   MCHC 34.1 30.0 - 36.0 g/dL   RDW 13.2 11.5 - 15.5 %   Platelets 146 (L) 150 - 400 K/uL   nRBC 0.0 0.0 - 0.2 %    Comment: Performed at [facility], [place] [address], [place],  [address]  Comprehensive metabolic panel     Status: Abnormal   Collection Time: 04/01/21  3:38 AM  Result Value Ref Range   Sodium 135 135 - 145 mmol/L   Potassium 3.5 3.5 - 5.1 mmol/L   Chloride 104 98 - 111 mmol/L  CO2 21 (L) 22 - 32 mmol/L   Glucose, Bld 89 70 - 99 mg/dL    Comment: Glucose reference range applies only to samples taken after fasting for at least 8 hours.   BUN 24 (H) 8 - 23 mg/dL   Creatinine, Ser 0.78 0.61 - 1.24 mg/dL   Calcium 8.9 8.9 - 10.3 mg/dL   Total Protein 7.0 6.5 - 8.1 g/dL   Albumin 3.3 (L) 3.5 - 5.0 g/dL   AST 106 (H) 15 - 41 U/L   ALT 247 (H) 0 - 44 U/L   Alkaline Phosphatase 123 38 - 126 U/L   Total Bilirubin 4.5 (H) 0.3 - 1.2 mg/dL   GFR, Estimated >60 >60 mL/min    Comment: (NOTE) Calculated using the CKD-EPI Creatinine Equation (2021)    Anion gap 10 5 - 15    Comment: Performed at [facility], [place] [address] [place], [place] [address]  Protime-INR     Status: None   Collection Time: 04/01/21  3:38 AM  Result Value Ref Range   Prothrombin Time 15.0 11.4 - 15.2 seconds   INR 1.2 0.8 - 1.2    Comment: (NOTE) INR goal varies based on device and disease states. Performed at [facility], [place] [address], [place],  [place] [address]   Bilirubin, direct     Status: Abnormal   Collection Time: 04/01/21  7:36 AM  Result Value Ref Range   Bilirubin, Direct 1.9 (H) 0.0 - 0.2 mg/dL    Comment: Performed at [place] [address] [place], [place] [address]   DG Chest 2 View  Result Date: 03/31/2021 CLINICAL DATA:  Shortness of breath. EXAM: CHEST - 2 VIEW COMPARISON:  Oct 18, 2020. FINDINGS: The heart size and mediastinal contours are within normal limits. Hypoinflation of the lungs is noted with mild bibasilar subsegmental atelectasis or scarring. The visualized skeletal structures are unremarkable. IMPRESSION: Hypoinflation of the lungs with mild bibasilar subsegmental atelectasis or scarring. Electronically Signed   By: Marijo Conception M.D.   On: 03/31/2021 11:56   CT ABDOMEN PELVIS W CONTRAST  Result Date: 03/31/2021 CLINICAL DATA:  Left upper quadrant pain, acute EXAM: CT ABDOMEN AND PELVIS WITH CONTRAST TECHNIQUE: Multidetector CT imaging of the abdomen and pelvis was performed using the standard protocol following bolus administration of intravenous contrast. CONTRAST:  172mL OMNIPAQUE IOHEXOL 350 MG/ML SOLN COMPARISON:  01/07/2018 FINDINGS: Lower chest: Chronic interstitial reticulation, ground-glass attenuation, and mild traction bronchiectasis identified within the lung bases compatible with fibrotic interstitial lung disease. Hepatobiliary: Heterogeneous fatty deposition within the right hepatic lobe is identified. No focal liver lesion identified. There is progressive atrophy of the left lobe of liver since 01/07/2018. The portal vein to the left hepatic lobe is markedly diminished in caliber compared with the previous exam. Previous cholecystectomy. Intrahepatic bile duct dilatation is again noted. The common bile duct is increased in caliber measuring up to 1.4 cm. This is unchanged when compared with the previous exam. Several foci of pneumobilia identified within the left hepatic lobe and distal  CBD. Pancreas: No focal pancreas lesion identified. No main duct dilatation or inflammation. Spleen: Normal in size without focal abnormality. Adrenals/Urinary Tract: Normal right adrenal gland. Left adrenal nodule measures 1.5 cm and is unchanged from previous exam, likely reflecting a benign adenoma. Posterior cortex left kidney cyst measures 1.6 cm. No nephrolithiasis or hydronephrosis. Urinary bladder is unremarkable. Stomach/Bowel: Stomach is within normal limits. Appendix appears normal. No evidence  of bowel wall thickening, distention, or inflammatory changes. Vascular/Lymphatic: Aortic atherosclerosis. No aneurysm. No abdominopelvic adenopathy. Reproductive: Prostate is unremarkable. Other: No free fluid or fluid collections. Musculoskeletal: Multilevel lumbar degenerative disc disease. Bilateral L5 pars defects identified with mild anterolisthesis of L5 on S1. Dorsal column stimulator identified with battery pack in the subcutaneous soft tissues of the left lower lumbar region. IMPRESSION: 1. No acute findings identified within the abdomen or pelvis. 2. Progressive atrophy of the left lobe of liver with diminished caliber of the portal vein to the left hepatic lobe. Etiology is indeterminate. Underlying infiltrative process such as cholangiocarcinoma cannot be excluded. When the patient is clinically stable and able to follow directions and hold their breath (preferably as an outpatient) further evaluation with dedicated contrast enhanced liver protocol MRI is recommended. 3. Chronic fibrotic interstitial lung disease. 4. Stable left adrenal nodule, likely benign adenoma. 5. Aortic Atherosclerosis (ICD10-I70.0). Electronically Signed   By: Kerby Moors M.D.   On: 03/31/2021 14:18   US Abdomen Limited RUQ (LIVER/GB)  Result Date: 03/31/2021 CLINICAL DATA:  Transaminitis. EXAM: ULTRASOUND ABDOMEN LIMITED RIGHT UPPER QUADRANT COMPARISON:  None. FINDINGS: Gallbladder: Removed. Common bile duct: Diameter:  14.2 mm Liver: Left lobe not well visualized. No focal lesion identified. Heterogeneous increased echotexture. Portal vein is patent on color Doppler imaging with normal direction of blood flow towards the liver. Other: None. IMPRESSION: 1. Heterogeneous liver with increased echotexture worrisome for diffuse hepatocellular disease. 2. Common bile duct is dilated measuring 14 mm this is similar to the prior CT. 3. Cholecystectomy. Electronically Signed   By: Ronney Asters M.D.   On: 03/31/2021 17:31    Pending Labs Unresulted Labs (From admission, onward)     Start     Ordered   04/01/21 0500  CEA  Tomorrow morning,   R        03/31/21 1705   04/01/21 0500  Cancer antigen 19-9  Tomorrow morning,   R        03/31/21 1705   03/31/21 2039  Bilirubin, direct  Add-on,   AD        03/31/21 2038   03/31/21 1659  Protime-INR  Add-on,   AD        03/31/21 1658            Vitals/Pain Today's Vitals   04/01/21 0500 04/01/21 0600 04/01/21 0700 04/01/21 1000  BP: (!) 98/58 117/73 122/83 (!) 134/93  Pulse: 78 86 83 88  Resp: (!) 22 16 (!) 24 15  Temp:  98.4 F (36.9 C)    TempSrc:  Oral    SpO2: 92% 94% 92% 93%  Weight:      PainSc:  3       Isolation Precautions No active isolations  Medications Medications  0.9 %  sodium chloride infusion ( Intravenous Not Given 04/01/21 1107)  metoprolol tartrate (LOPRESSOR) tablet 12.5 mg (12.5 mg Oral Given 04/01/21 0954)  pantoprazole (PROTONIX) EC tablet 40 mg (40 mg Oral Given 04/01/21 0954)  gabapentin (NEURONTIN) tablet 1,200 mg (1,200 mg Oral Given 04/01/21 0954)  enoxaparin (LOVENOX) injection 40 mg (40 mg Subcutaneous Given 03/31/21 1841)  0.9 %  sodium chloride infusion ( Intravenous New Bag/Given 03/31/21 2243)  ketorolac (TORADOL) 30 MG/ML injection 15 mg (15 mg Intravenous Given 04/01/21 0954)  piperacillin-tazobactam (ZOSYN) IVPB 3.375 g (3.375 g Intravenous New Bag/Given 04/01/21 0734)  LORazepam (ATIVAN) injection 1 mg (0 mg Intravenous  Hold 04/01/21 1108)  iohexol (OMNIPAQUE) 350 MG/ML injection 100 mL (  100 mLs Intravenous Contrast Given 03/31/21 1349)  fentaNYL (SUBLIMAZE) injection 50 mcg (50 mcg Intravenous Given 03/31/21 1606)  lactated ringers bolus 1,000 mL (0 mLs Intravenous Stopped 03/31/21 2152)  piperacillin-tazobactam (ZOSYN) IVPB 3.375 g (0 g Intravenous Stopped 03/31/21 1917)  LORazepam (ATIVAN) injection 1 mg (2 mg Intravenous Given 03/31/21 1915)    Mobility walks with device Low fall risk   Focused Assessments RUQ Abd pain    R Recommendations: See Admitting Provider Note  Report given to:   Additional Notes: has1 mg of ativan prior to MRI. Pt has his remote for stimulator.   Going to endo at 1245

## 2021-04-01 NOTE — Progress Notes (Signed)
Per [employer] pt has extended leads implanted from SCS that are unsafe for MRI. Pt cannot have MRI. Ordering Dr. Darlyn Chamber.

## 2021-04-01 NOTE — Interval H&P Note (Signed)
History and Physical Interval Note:  04/01/2021 1:54 PM  Alexander [name]  has presented today for surgery, with the diagnosis of Elevated LFTs, cholangitis.  The various methods of treatment have been discussed with the patient and family. After consideration of risks, benefits and other options for treatment, the patient has consented to  Procedure(s) with comments: ENDOSCOPIC RETROGRADE CHOLANGIOPANCREATOGRAPHY (ERCP) (N/A) - Please schedule after 1 PM as a surgical intervention.  [name] history has been reviewed, patient examined, no change in status, stable for surgery.  I have reviewed the patient's chart and labs.  Questions were answered to the patient's satisfaction.     Jackquline Denmark

## 2021-04-01 NOTE — Transfer of Care (Signed)
Immediate Anesthesia Transfer of Care Note  Patient: Alexander [name]  Procedure(s) Performed: ENDOSCOPIC RETROGRADE CHOLANGIOPANCREATOGRAPHY (ERCP) BILIARY STENT PLACEMENT  Patient Location: Endoscopy Unit  Anesthesia Type:General  Level of Consciousness: drowsy and patient cooperative  Airway & Oxygen Therapy: Patient Spontanous Breathing and Patient connected to face mask oxygen  Post-op Assessment: Report given to RN and Post -op Vital signs reviewed and stable  Post vital signs: Reviewed and stable  Last Vitals:  Vitals Value Taken Time  BP 125/90 04/01/21 1540  Temp    Pulse 96 04/01/21 1542  Resp 24 04/01/21 1542  SpO2 98 % 04/01/21 1542  Vitals shown include unvalidated device data.  Last Pain:  Vitals:   04/01/21 1540  TempSrc:   PainSc: Asleep         Complications: No notable events documented.

## 2021-04-01 NOTE — Progress Notes (Signed)
PROGRESS NOTE   Alexander [name]  KVQ:[id number]    DOB: 10-02-1950    DOA: 03/31/2021  PCP: [name], MD   I have briefly reviewed patients previous medical records in [facility].  Chief Complaint  Patient presents with   Abdominal Pain   Shortness of Breath    Brief Narrative:  70 year old male with medical history significant for polio, NSTEMI/CAD s/p stents, HTN, choledocholithiasis, s/p ERCP with sphincterotomy and balloon sweeping, cholecystectomy, duodenal stricture s/p dilatation, GERD, HLD, OSA not on CPAP, multiple other medical problems, presented to the ED due to abdominal pain of 2 weeks duration.  Noted to have elevated LFTs and leukocytosis.  [facility] consulted.   Assessment & Plan:  Active Problems:   Post-polio syndrome   Essential hypertension   CAD S/P percutaneous coronary angioplasty   Duodenal stenosis   Bile duct obstruction   Ascending cholangitis   Recurrent RUQ abdominal pain, abnormal LFTs due to symptomatic choledocholithiasis and possible ascending cholangitis - LFTs elevated and mixed pattern - CT A/P 10/31: No acute findings in abdomen or pelvis.  Progressive atrophy of left lobe of liver with diminished caliber of the portal vein to the left hepatic lobe, etiology indeterminate.  Underlying infiltrative process such as cholangiocarcinoma not excluded. - RUQ ultrasound: Heterogeneous liver with increased echotexture worrisome for diffuse hepatocellular disease.  CBD measuring 14 mm. [facility] consulted and patient underwent ERCP 11/1 that showed evidence of dilated CBD containing numerous calculi and an endoscopy biliary stent was placed in the common bile duct. - LFTs have improved even prior to ERCP.  Follow CMP in AM.  CAD/history of NSTEMI and PCI's - Asymptomatic of anginal symptoms - Statins on hold due to abnormal LFTs  Paroxysmal A. fib - As per spouse, in the setting of acute illness.  Followed by cardiology and not  on anticoagulation. - Currently in sinus rhythm.  Continue home metoprolol.  Essential hypertension - Reasonably controlled  Leukocytosis and thrombocytopenia - Likely related to acute illness noted above. - Follow CBC.  History of poliomyelitis/neuropathy -Continue gabapentin. - Patient ambulates with the help of bilateral lower extremity braces and crutches.  Chronic fibrotic interstitial lung disease - Noted on CT A/P.  Outpatient follow-up and evaluation as deemed necessary.  Stable left adrenal nodule, likely benign adenoma - Noted on CT A/P.  Outpatient follow-up.  Body mass index is 28.31 kg/m.   DVT prophylaxis: enoxaparin (LOVENOX) injection 40 mg Start: 03/31/21 1745     Code Status: Full Code Family Communication: Spouse at bedside. Disposition:  Status is: Inpatient  Remains inpatient appropriate because: S/p ERCP today, need to advance diet, continuing IV antibiotics and monitoring for complications.        Consultants:   [facility]  Procedures:   ERCP 04/01/2021:  Impression  - Ascending cholangitis s/p Endo biliary stenting - Choledocholithiasis was found. Removal was not                 attempted; a stent was inserted. - J-shaped stomach. - Duodenal stenosis. - Previous cholecystectomy.  Recommendations:  - Clear liquid diet today, advance diet as tolerated - Continue Zosyn IV for now, trend CBC, follow blood cultures and would need antibiotics for 10 days - Await pathology results - He would require repeat ERCP +/- EUS with Dr. Rush Landmark in 6 to 8 weeks and it is believed that the left lower lobe atrophy is likely ischemic in etiology rather than neoplastic.  Follow CA 19-9. -  Monitor for pancreatitis, bleeding, perforation and cholangitis.  Antimicrobials:    Anti-infectives (From admission, onward)    Start     Dose/Rate Route Frequency Ordered Stop   04/01/21 0000  piperacillin-tazobactam (ZOSYN) IVPB 3.375 g        3.375 g 12.5  mL/hr over 240 Minutes Intravenous Every 8 hours 03/31/21 1800     03/31/21 1600  piperacillin-tazobactam (ZOSYN) IVPB 3.375 g        3.375 g 12.5 mL/hr over 240 Minutes Intravenous  Once 03/31/21 1553 03/31/21 1917         Subjective:  Seen this evening after procedure.  Apart from generalized weakness, patient denies any complaints.  Denies abdominal pain.  Patient reportedly was confused postprocedure which has since resolved.  Objective:   Vitals:   04/01/21 1309 04/01/21 1540 04/01/21 1610 04/01/21 1655  BP: 111/76 125/90 (!) 154/90 (!) 140/98  Pulse: 75 90 74 74  Resp: 20 (!) 24 18 20   Temp:  98.1 F (36.7 C) 97.8 F (36.6 C) 97.8 F (36.6 C)  TempSrc:  Temporal Oral Oral  SpO2: 94% 97% 99% 92%  Weight: 92.1 kg     Height: 5\' 11"  (1.803 m)       General exam: Elderly male, moderately built and nourished lying comfortably propped up in bed without distress.?  Icteric skin and sclera. Respiratory system: Clear to auscultation. Respiratory effort normal. Cardiovascular system: S1 & S2 heard, RRR. No JVD, murmurs, rubs, gallops or clicks. No pedal edema. Gastrointestinal system: Abdomen is nondistended, soft and nontender. No organomegaly or masses felt. Normal bowel sounds heard. Central nervous system: Alert and oriented. No focal neurological deficits. Extremities: Symmetric 5 x 5 power. Skin: No rashes, lesions or ulcers Psychiatry: Judgement and insight appear normal. Mood & affect appropriate.     Data Reviewed:   I have personally reviewed following labs and imaging studies   CBC: Recent Labs  Lab 03/31/21 1123 04/01/21 0338  WBC 15.9* 19.8*  NEUTROABS 14.8*  --   HGB 14.7 13.3  HCT 42.8 39.0  MCV 95.3 95.8  PLT 231 146*    Basic Metabolic Panel: Recent Labs  Lab 03/31/21 1123 04/01/21 0338  NA 136 135  K 3.7 3.5  CL 103 104  CO2 19* 21*  GLUCOSE 94 89  BUN 17 24*  CREATININE 0.83 0.78  CALCIUM 9.5 8.9    Liver Function Tests: Recent  Labs  Lab 03/31/21 1123 04/01/21 0338  AST 236* 106*  ALT 367* 247*  ALKPHOS 158* 123  BILITOT 5.8* 4.5*  PROT 7.5 7.0  ALBUMIN 3.9 3.3*    CBG: No results for input(s): GLUCAP in the last 168 hours.  Microbiology Studies:   Recent Results (from the past 240 hour(s))  Resp Panel by RT-PCR (Flu A&B, Covid) Nasopharyngeal Swab     Status: None   Collection Time: 03/31/21  3:48 PM   Specimen: Nasopharyngeal Swab; Nasopharyngeal(NP) swabs in vial transport medium  Result Value Ref Range Status   SARS Coronavirus 2 by RT PCR NEGATIVE NEGATIVE Final    Comment: (NOTE) SARS-CoV-2 target nucleic acids are NOT DETECTED.  The SARS-CoV-2 RNA is generally detectable in upper respiratory specimens during the acute phase of infection. The lowest concentration of SARS-CoV-2 viral copies this assay can detect is 138 copies/mL. A negative result does not preclude SARS-Cov-2 infection and should not be used as the sole basis for treatment or other patient management decisions. A negative result may occur with  improper  specimen collection/handling, submission of specimen other than nasopharyngeal swab, presence of viral mutation(s) within the areas targeted by this assay, and inadequate number of viral copies(<138 copies/mL). A negative result must be combined with clinical observations, patient history, and epidemiological information. The expected result is Negative.  Fact Sheet for Patients:  EntrepreneurPulse.com.au  Fact Sheet for Healthcare Providers:  IncredibleEmployment.be  This test is no t yet approved or cleared by the [place] FDA and  has been authorized for detection and/or diagnosis of SARS-CoV-2 by FDA under an Emergency Use Authorization (EUA). This EUA will remain  in effect (meaning this test can be used) for the duration of the COVID-19 declaration under Section 564(b)(1) of the Act, 21 U.S.C.section 360bbb-3(b)(1), unless  the authorization is terminated  or revoked sooner.       Influenza A by PCR NEGATIVE NEGATIVE Final   Influenza B by PCR NEGATIVE NEGATIVE Final    Comment: (NOTE) The Xpert Xpress SARS-CoV-2/FLU/RSV plus assay is intended as an aid in the diagnosis of influenza from Nasopharyngeal swab specimens and should not be used as a sole basis for treatment. Nasal washings and aspirates are unacceptable for Xpert Xpress SARS-CoV-2/FLU/RSV testing.  Fact Sheet for Patients: EntrepreneurPulse.com.au  Fact Sheet for Healthcare Providers: IncredibleEmployment.be  This test is not yet approved or cleared by the [place] FDA and has been authorized for detection and/or diagnosis of SARS-CoV-2 by FDA under an Emergency Use Authorization (EUA). This EUA will remain in effect (meaning this test can be used) for the duration of the COVID-19 declaration under Section 564(b)(1) of the Act, 21 U.S.C. section 360bbb-3(b)(1), unless the authorization is terminated or revoked.  Performed at [facility], [place] [address], [place],  [address]      Radiology Studies:  DG Chest 2 View  Result Date: 03/31/2021 CLINICAL DATA:  Shortness of breath. EXAM: CHEST - 2 VIEW COMPARISON:  Oct 18, 2020. FINDINGS: The heart size and mediastinal contours are within normal limits. Hypoinflation of the lungs is noted with mild bibasilar subsegmental atelectasis or scarring. The visualized skeletal structures are unremarkable. IMPRESSION: Hypoinflation of the lungs with mild bibasilar subsegmental atelectasis or scarring. Electronically Signed   By: Marijo Conception M.D.   On: 03/31/2021 11:56   CT ABDOMEN PELVIS W CONTRAST  Result Date: 03/31/2021 CLINICAL DATA:  Left upper quadrant pain, acute EXAM: CT ABDOMEN AND PELVIS WITH CONTRAST TECHNIQUE: Multidetector CT imaging of the abdomen and pelvis was performed using the standard protocol following bolus  administration of intravenous contrast. CONTRAST:  123mL OMNIPAQUE IOHEXOL 350 MG/ML SOLN COMPARISON:  01/07/2018 FINDINGS: Lower chest: Chronic interstitial reticulation, ground-glass attenuation, and mild traction bronchiectasis identified within the lung bases compatible with fibrotic interstitial lung disease. Hepatobiliary: Heterogeneous fatty deposition within the right hepatic lobe is identified. No focal liver lesion identified. There is progressive atrophy of the left lobe of liver since 01/07/2018. The portal vein to the left hepatic lobe is markedly diminished in caliber compared with the previous exam. Previous cholecystectomy. Intrahepatic bile duct dilatation is again noted. The common bile duct is increased in caliber measuring up to 1.4 cm. This is unchanged when compared with the previous exam. Several foci of pneumobilia identified within the left hepatic lobe and distal CBD. Pancreas: No focal pancreas lesion identified. No main duct dilatation or inflammation. Spleen: Normal in size without focal abnormality. Adrenals/Urinary Tract: Normal right adrenal gland. Left adrenal nodule measures 1.5 cm and is unchanged from previous exam, likely reflecting a benign  adenoma. Posterior cortex left kidney cyst measures 1.6 cm. No nephrolithiasis or hydronephrosis. Urinary bladder is unremarkable. Stomach/Bowel: Stomach is within normal limits. Appendix appears normal. No evidence of bowel wall thickening, distention, or inflammatory changes. Vascular/Lymphatic: Aortic atherosclerosis. No aneurysm. No abdominopelvic adenopathy. Reproductive: Prostate is unremarkable. Other: No free fluid or fluid collections. Musculoskeletal: Multilevel lumbar degenerative disc disease. Bilateral L5 pars defects identified with mild anterolisthesis of L5 on S1. Dorsal column stimulator identified with battery pack in the subcutaneous soft tissues of the left lower lumbar region. IMPRESSION: 1. No acute findings identified  within the abdomen or pelvis. 2. Progressive atrophy of the left lobe of liver with diminished caliber of the portal vein to the left hepatic lobe. Etiology is indeterminate. Underlying infiltrative process such as cholangiocarcinoma cannot be excluded. When the patient is clinically stable and able to follow directions and hold their breath (preferably as an outpatient) further evaluation with dedicated contrast enhanced liver protocol MRI is recommended. 3. Chronic fibrotic interstitial lung disease. 4. Stable left adrenal nodule, likely benign adenoma. 5. Aortic Atherosclerosis (ICD10-I70.0). Electronically Signed   By: Kerby Moors M.D.   On: 03/31/2021 14:18   DG ERCP BILIARY & PANCREATIC DUCTS  Result Date: 04/01/2021 CLINICAL DATA:  Biliary dilatation, cholangitis and choledocholithiasis. EXAM: ERCP TECHNIQUE: Multiple spot images obtained with the fluoroscopic device and submitted for interpretation post-procedure. COMPARISON:  CT and ultrasound studies on 03/31/2021 FINDINGS: Imaging with a C-arm demonstrates cannulation of the common bile duct with contrast injection demonstrating diffuse dilatation of the common bile duct and numerous filling defects in the common duct consistent with calculi. An endoscopic biliary stent was placed spanning the extent of biliary dilatation and filling defects. IMPRESSION: Evidence of dilated common bile duct containing numerous calculi. An endoscopic biliary stent was placed in the common bile duct. These images were submitted for radiologic interpretation only. Please see the procedural report for the amount of contrast and the fluoroscopy time utilized. Electronically Signed   By: Aletta Edouard M.D.   On: 04/01/2021 16:31   US Abdomen Limited RUQ (LIVER/GB)  Result Date: 03/31/2021 CLINICAL DATA:  Transaminitis. EXAM: ULTRASOUND ABDOMEN LIMITED RIGHT UPPER QUADRANT COMPARISON:  None. FINDINGS: Gallbladder: Removed. Common bile duct: Diameter: 14.2 mm Liver:  Left lobe not well visualized. No focal lesion identified. Heterogeneous increased echotexture. Portal vein is patent on color Doppler imaging with normal direction of blood flow towards the liver. Other: None. IMPRESSION: 1. Heterogeneous liver with increased echotexture worrisome for diffuse hepatocellular disease. 2. Common bile duct is dilated measuring 14 mm this is similar to the prior CT. 3. Cholecystectomy. Electronically Signed   By: Ronney Asters M.D.   On: 03/31/2021 17:31     Scheduled Meds:    enoxaparin (LOVENOX) injection  40 mg Subcutaneous Q24H   gabapentin  1,200 mg Oral TID   LORazepam  1 mg Intravenous Once   metoprolol tartrate  12.5 mg Oral BID   pantoprazole  40 mg Oral Daily    Continuous Infusions:    sodium chloride 100 mL/hr at 04/01/21 1807   lactated ringers 10 mL/hr at 04/01/21 1316   piperacillin-tazobactam (ZOSYN)  IV Stopped (04/01/21 1134)     LOS: 1 day     Vernell Leep, MD, [place], [facility]. [facility]    To contact the attending provider between 7A-7P or the covering provider during after hours 7P-7A, please log into the web site [email or web address] and access using universal [place] password for that web site. If you do  not have the password, please call the hospital operator.  04/01/2021, 6:17 PM

## 2021-04-02 ENCOUNTER — Encounter (HOSPITAL_COMMUNITY): Payer: Self-pay | Admitting: Gastroenterology

## 2021-04-02 DIAGNOSIS — K8309 Other cholangitis: Secondary | ICD-10-CM | POA: Diagnosis not present

## 2021-04-02 LAB — CBC
HCT: 37.8 % — ABNORMAL LOW (ref 39.0–52.0)
Hemoglobin: 12.7 g/dL — ABNORMAL LOW (ref 13.0–17.0)
MCH: 32.2 pg (ref 26.0–34.0)
MCHC: 33.6 g/dL (ref 30.0–36.0)
MCV: 95.7 fL (ref 80.0–100.0)
Platelets: 127 10*3/uL — ABNORMAL LOW (ref 150–400)
RBC: 3.95 MIL/uL — ABNORMAL LOW (ref 4.22–5.81)
RDW: 13 % (ref 11.5–15.5)
WBC: 20.5 10*3/uL — ABNORMAL HIGH (ref 4.0–10.5)
nRBC: 0 % (ref 0.0–0.2)

## 2021-04-02 LAB — COMPREHENSIVE METABOLIC PANEL
ALT: 162 U/L — ABNORMAL HIGH (ref 0–44)
AST: 50 U/L — ABNORMAL HIGH (ref 15–41)
Albumin: 3 g/dL — ABNORMAL LOW (ref 3.5–5.0)
Alkaline Phosphatase: 123 U/L (ref 38–126)
Anion gap: 8 (ref 5–15)
BUN: 18 mg/dL (ref 8–23)
CO2: 22 mmol/L (ref 22–32)
Calcium: 8.6 mg/dL — ABNORMAL LOW (ref 8.9–10.3)
Chloride: 105 mmol/L (ref 98–111)
Creatinine, Ser: 0.62 mg/dL (ref 0.61–1.24)
GFR, Estimated: >60 mL/min (ref >60)
Glucose, Bld: 127 mg/dL — ABNORMAL HIGH (ref 70–99)
Potassium: 3.7 mmol/L (ref 3.5–5.1)
Sodium: 135 mmol/L (ref 135–145)
Total Bilirubin: 2.1 mg/dL — ABNORMAL HIGH (ref 0.3–1.2)
Total Protein: 6.7 g/dL (ref 6.5–8.1)

## 2021-04-02 LAB — CANCER ANTIGEN 19-9: CA 19-9: <2 U/mL (ref 0–35)

## 2021-04-02 LAB — CEA: CEA: 4.5 ng/mL (ref 0.0–4.7)

## 2021-04-02 MED ORDER — TRAZODONE HCL 50 MG PO TABS
50.0000 mg | ORAL_TABLET | Freq: Every evening | ORAL | Status: DC | PRN
  Administered 2021-04-02: 50 mg via ORAL
  Filled 2021-04-02: qty 1

## 2021-04-02 NOTE — Progress Notes (Signed)
Daily Rounding Note  04/02/2021, 9:34 AM  LOS: 2 days   SUBJECTIVE:   Chief complaint:   ascending cholangitis   Patient feels great.  Abdominal pain much improved, hardly any at present time.  Never had nausea or vomiting.  He is hungry and diet currently is clear liquid.  Had a bowel movement this morning.  OBJECTIVE:         Vital signs in last 24 hours:    Temp:  [97.6 F (36.4 C)-100.1 F (37.8 C)] 97.6 F (36.4 C) (11/02 0750) Pulse Rate:  [58-90] 62 (11/02 0750) Resp:  [13-24] 18 (11/02 0410) BP: (109-154)/(65-98) 109/73 (11/02 0750) SpO2:  [92 %-99 %] 94 % (11/02 0750) Weight:  [92.1 kg] 92.1 kg (11/01 1309) Last BM Date: 04/01/21 Filed Weights   03/31/21 1700 04/01/21 1309  Weight: 92.1 kg 92.1 kg   General: Looks well.  Comfortable.  Alert. Heart: RRR. Chest: No labored breathing or cough Abdomen: Soft.  Not tender.  Not distended.  Active bowel sounds. Extremities: No CCE.  Bruising on the inside of the right heel (from his leg brace) Neuro/Psych: Pleasant, calm, cooperative.  Fluid speech.  No gross deficits, no tremors.  Intake/Output from previous day: 11/01 0701 - 11/02 0700 In: 2294.8 [P.O.:420; I.V.:1733.7; IV Piggyback:141.1] Out: 660 [Urine:650; Blood:10]  Intake/Output this shift: No intake/output data recorded.  Lab Results: Recent Labs    03/31/21 1123 04/01/21 0338 04/02/21 0138  WBC 15.9* 19.8* 20.5*  HGB 14.7 13.3 12.7*  HCT 42.8 39.0 37.8*  PLT 231 146* 127*   BMET Recent Labs    03/31/21 1123 04/01/21 0338 04/02/21 0138  NA 136 135 135  K 3.7 3.5 3.7  CL 103 104 105  CO2 19* 21* 22  GLUCOSE 94 89 127*  BUN 17 24* 18  CREATININE 0.83 0.78 0.62  CALCIUM 9.5 8.9 8.6*   LFT Recent Labs    03/31/21 1123 04/01/21 0338 04/01/21 0736 04/02/21 0138  PROT 7.5 7.0  --  6.7  ALBUMIN 3.9 3.3*  --  3.0*  AST 236* 106*  --  50*  ALT 367* 247*  --  162*  ALKPHOS 158*  123  --  123  BILITOT 5.8* 4.5*  --  2.1*  BILIDIR  --   --  1.9*  --    PT/INR Recent Labs    04/01/21 0338  LABPROT 15.0  INR 1.2   Hepatitis Panel Recent Labs    03/31/21 1612  HEPBSAG NON REACTIVE  HCVAB NON REACTIVE  HEPAIGM NON REACTIVE  HEPBIGM NON REACTIVE    Studies/Results: DG Chest 2 View  Result Date: 03/31/2021 CLINICAL DATA:  Shortness of breath. EXAM: CHEST - 2 VIEW COMPARISON:  Oct 18, 2020. FINDINGS: The heart size and mediastinal contours are within normal limits. Hypoinflation of the lungs is noted with mild bibasilar subsegmental atelectasis or scarring. The visualized skeletal structures are unremarkable. IMPRESSION: Hypoinflation of the lungs with mild bibasilar subsegmental atelectasis or scarring. Electronically Signed   By: Marijo Conception M.D.   On: 03/31/2021 11:56   CT ABDOMEN PELVIS W CONTRAST  Result Date: 03/31/2021 CLINICAL DATA:  Left upper quadrant pain, acute EXAM: CT ABDOMEN AND PELVIS WITH CONTRAST TECHNIQUE: Multidetector CT imaging of the abdomen and pelvis was performed using the standard protocol following bolus administration of intravenous contrast. CONTRAST:  163mL OMNIPAQUE IOHEXOL 350 MG/ML SOLN COMPARISON:  01/07/2018 FINDINGS: Lower chest: Chronic interstitial reticulation, ground-glass attenuation,  and mild traction bronchiectasis identified within the lung bases compatible with fibrotic interstitial lung disease. Hepatobiliary: Heterogeneous fatty deposition within the right hepatic lobe is identified. No focal liver lesion identified. There is progressive atrophy of the left lobe of liver since 01/07/2018. The portal vein to the left hepatic lobe is markedly diminished in caliber compared with the previous exam. Previous cholecystectomy. Intrahepatic bile duct dilatation is again noted. The common bile duct is increased in caliber measuring up to 1.4 cm. This is unchanged when compared with the previous exam. Several foci of pneumobilia  identified within the left hepatic lobe and distal CBD. Pancreas: No focal pancreas lesion identified. No main duct dilatation or inflammation. Spleen: Normal in size without focal abnormality. Adrenals/Urinary Tract: Normal right adrenal gland. Left adrenal nodule measures 1.5 cm and is unchanged from previous exam, likely reflecting a benign adenoma. Posterior cortex left kidney cyst measures 1.6 cm. No nephrolithiasis or hydronephrosis. Urinary bladder is unremarkable. Stomach/Bowel: Stomach is within normal limits. Appendix appears normal. No evidence of bowel wall thickening, distention, or inflammatory changes. Vascular/Lymphatic: Aortic atherosclerosis. No aneurysm. No abdominopelvic adenopathy. Reproductive: Prostate is unremarkable. Other: No free fluid or fluid collections. Musculoskeletal: Multilevel lumbar degenerative disc disease. Bilateral L5 pars defects identified with mild anterolisthesis of L5 on S1. Dorsal column stimulator identified with battery pack in the subcutaneous soft tissues of the left lower lumbar region. IMPRESSION: 1. No acute findings identified within the abdomen or pelvis. 2. Progressive atrophy of the left lobe of liver with diminished caliber of the portal vein to the left hepatic lobe. Etiology is indeterminate. Underlying infiltrative process such as cholangiocarcinoma cannot be excluded. When the patient is clinically stable and able to follow directions and hold their breath (preferably as an outpatient) further evaluation with dedicated contrast enhanced liver protocol MRI is recommended. 3. Chronic fibrotic interstitial lung disease. 4. Stable left adrenal nodule, likely benign adenoma. 5. Aortic Atherosclerosis (ICD10-I70.0). Electronically Signed   By: Kerby Moors M.D.   On: 03/31/2021 14:18   DG ERCP BILIARY & PANCREATIC DUCTS  Result Date: 04/01/2021 CLINICAL DATA:  Biliary dilatation, cholangitis and choledocholithiasis. EXAM: ERCP TECHNIQUE: Multiple spot  images obtained with the fluoroscopic device and submitted for interpretation post-procedure. COMPARISON:  CT and ultrasound studies on 03/31/2021 FINDINGS: Imaging with a C-arm demonstrates cannulation of the common bile duct with contrast injection demonstrating diffuse dilatation of the common bile duct and numerous filling defects in the common duct consistent with calculi. An endoscopic biliary stent was placed spanning the extent of biliary dilatation and filling defects. IMPRESSION: Evidence of dilated common bile duct containing numerous calculi. An endoscopic biliary stent was placed in the common bile duct. These images were submitted for radiologic interpretation only. Please see the procedural report for the amount of contrast and the fluoroscopy time utilized. Electronically Signed   By: Aletta Edouard M.D.   On: 04/01/2021 16:31   US Abdomen Limited RUQ (LIVER/GB)  Result Date: 03/31/2021 CLINICAL DATA:  Transaminitis. EXAM: ULTRASOUND ABDOMEN LIMITED RIGHT UPPER QUADRANT COMPARISON:  None. FINDINGS: Gallbladder: Removed. Common bile duct: Diameter: 14.2 mm Liver: Left lobe not well visualized. No focal lesion identified. Heterogeneous increased echotexture. Portal vein is patent on color Doppler imaging with normal direction of blood flow towards the liver. Other: None. IMPRESSION: 1. Heterogeneous liver with increased echotexture worrisome for diffuse hepatocellular disease. 2. Common bile duct is dilated measuring 14 mm this is similar to the prior CT. 3. Cholecystectomy. Electronically Signed   By: Ronney Asters  M.D.   On: 03/31/2021 17:31    ASSESMENT:   Acute, ascending cholangitis.  Prior ERCP/sphinct, cholecystectomy, dilations x 2 of duodenal stricture.   04/01/2021 ERCP: J-shaped stomach.  Duodenal stenosis.  Choledocholithiasis, no attempt to remove, 10 [place], 7 mm plastic stent placed.  Zosyn day 3.   No fever.  Labs today with improved LFTs, rising WBCs.  CA 19-9, CEA levels  both normal.  Atrophic left liver lobe, progressive since 2019, suspect ischemic.  Thrombocytopenia.  Mild anemia with Hb 12.7 in setting of rehydration/IV fluids    ILD  Post polio syndrome.   PLAN     Repeat ERCP, possible EUS and spyglass procedure with Dr. Rush Landmark in 6 to 8 weeks.  Has scheduled office follow-up on 11/16 with GI APP.  Dr. Zenovia Jarred is his GI doctor.  Advanced diet to heart healthy/carb mod.  WBCs, LFTs in the morning.  Continue Rosealee Albee  04/02/2021, 9:34 AM Phone [phone]

## 2021-04-02 NOTE — Progress Notes (Signed)
Mobility Specialist Progress Note:   04/02/21 1130  Mobility  Activity Refused mobility   Pt refused mobility d/t not wanting to put his braces on yet, "too much of a hassle". Encouraged ambulation around the room.   Nelta Numbers Mobility Specialist  Phone [phone]

## 2021-04-02 NOTE — Plan of Care (Signed)
  Problem: Nutrition: Goal: Adequate nutrition will be maintained Outcome: Progressing   Problem: Education: Goal: Knowledge of General Education information will improve Description: Including pain rating scale, medication(s)/side effects and non-pharmacologic comfort measures Outcome: Progressing   Problem: Elimination: Goal: Will not experience complications related to urinary retention Outcome: Progressing   Problem: Pain Managment: Goal: General experience of comfort will improve Outcome: Progressing

## 2021-04-02 NOTE — Progress Notes (Signed)
PROGRESS NOTE  Alexander [name] EPP:[id number] DOB: April 14, 1951 DOA: 03/31/2021 PCP: [name], MD  HPI/Recap of past 27 hours:  70 year old male with medical history significant for polio, NSTEMI/CAD s/p stents, HTN, choledocholithiasis, s/p ERCP with sphincterotomy and balloon sweeping, cholecystectomy, duodenal stricture s/p dilatation, GERD, HLD, OSA not on CPAP, multiple other medical problems, presented to [facility] ED due to abdominal pain of 2 weeks duration.  Noted to have elevated LFTs and leukocytosis.  Seen by [facility].  Due to concern for acute ascending cholangitis patient had an ERCP.  Doing well after his procedure.  Denies any abdominal pain, nausea or vomiting.  Tolerating a liquid diet.  Diet advanced by GI.  04/02/2021: Patient was seen and examined at his bedside.  His wife was present in the room.  He has no new complaints.  His LFTs are downtrending.  Assessment/Plan: Active Problems:   Post-polio syndrome   Essential hypertension   CAD S/P percutaneous coronary angioplasty   Duodenal stenosis   Bile duct obstruction   Ascending cholangitis  Acute ascending cholangitis status post ERCP on 04/01/2021. ERCP showed evidence of dilated common bile duct containing numerous calculi, and endoscopy biliary stent was placed in the common bile duct. LFTs improving.  Denies any abdominal pain or nausea this morning. Continue Zosyn, likely will switch to oral antibiotics in the morning.  Essential hypertension BPs stable Continue Lopressor 12.5 mg twice daily  GERD Continue home PPI 40 mg daily  Paroxysmal A. fib Continue Lopressor for rate control Not anticoagulated Follows with cardiology  History of poliomyelitis/neuropathy Stable Continue home gabapentin    Code Status: Full code  Family Communication: Wife at bedside  Disposition Plan: Likely will discharge to home once GI signs  of.   Consultants: GI  Procedures: ERCP  Antimicrobials: Zosyn  DVT prophylaxis: Subcu Lovenox daily  Status is: Inpatient  Patient require at least 2 midnights for further evaluation and treatment of present condition.        Objective: Vitals:   04/02/21 0407 04/02/21 0410 04/02/21 0750 04/02/21 1539  BP: 125/76 125/76 109/73 106/76  Pulse: (!) 58 (!) 58 62 61  Resp: 18 18 16    Temp: 100.1 F (37.8 C) 100.1 F (37.8 C) 97.6 F (36.4 C) 97.9 F (36.6 C)  TempSrc: Oral Oral Oral Oral  SpO2: 94% 94% 94% 95%  Weight:      Height:        Intake/Output Summary (Last 24 hours) at 04/02/2021 1732 Last data filed at 04/02/2021 1647 Gross per 24 hour  Intake 2125.77 ml  Output 850 ml  Net 1275.77 ml   Filed Weights   03/31/21 1700 04/01/21 1309  Weight: 92.1 kg 92.1 kg    Exam:  General: 70 y.o. year-old male well developed well nourished in no acute distress.  Alert and oriented x3. Cardiovascular: Regular rate and rhythm with no rubs or gallops.  No thyromegaly or JVD noted.   Respiratory: Clear to auscultation with no wheezes or rales. Good inspiratory effort. Abdomen: Soft nontender nondistended with normal bowel sounds x4 quadrants. Musculoskeletal: No lower extremity edema. 2/4 pulses in all 4 extremities. Skin: No ulcerative lesions noted or rashes, Psychiatry: Mood is appropriate for condition and setting   Data Reviewed: CBC: Recent Labs  Lab 03/31/21 1123 04/01/21 0338 04/02/21 0138  WBC 15.9* 19.8* 20.5*  NEUTROABS 14.8*  --   --   HGB 14.7 13.3 12.7*  HCT 42.8 39.0 37.8*  MCV 95.3 95.8 95.7  PLT  231 146* 491*   Basic Metabolic Panel: Recent Labs  Lab 03/31/21 1123 04/01/21 0338 04/02/21 0138  NA 136 135 135  K 3.7 3.5 3.7  CL 103 104 105  CO2 19* 21* 22  GLUCOSE 94 89 127*  BUN 17 24* 18  CREATININE 0.83 0.78 0.62  CALCIUM 9.5 8.9 8.6*   GFR: Estimated Creatinine Clearance: 98.2 mL/min (by C-G formula based on SCr of  0.62 mg/dL). Liver Function Tests: Recent Labs  Lab 03/31/21 1123 04/01/21 0338 04/02/21 0138  AST 236* 106* 50*  ALT 367* 247* 162*  ALKPHOS 158* 123 123  BILITOT 5.8* 4.5* 2.1*  PROT 7.5 7.0 6.7  ALBUMIN 3.9 3.3* 3.0*   Recent Labs  Lab 03/31/21 1123  LIPASE 22   No results for input(s): AMMONIA in the last 168 hours. Coagulation Profile: Recent Labs  Lab 04/01/21 0338  INR 1.2   Cardiac Enzymes: No results for input(s): CKTOTAL, CKMB, CKMBINDEX, TROPONINI in the last 168 hours. BNP (last 3 results) No results for input(s): PROBNP in the last 8760 hours. HbA1C: No results for input(s): HGBA1C in the last 72 hours. CBG: No results for input(s): GLUCAP in the last 168 hours. Lipid Profile: No results for input(s): CHOL, HDL, LDLCALC, TRIG, CHOLHDL, LDLDIRECT in the last 72 hours. Thyroid Function Tests: No results for input(s): TSH, T4TOTAL, FREET4, T3FREE, THYROIDAB in the last 72 hours. Anemia Panel: No results for input(s): VITAMINB12, FOLATE, FERRITIN, TIBC, IRON, RETICCTPCT in the last 72 hours. Urine analysis:    Component Value Date/Time   COLORURINE AMBER (A) 04/01/2021 0338   APPEARANCEUR CLEAR 04/01/2021 0338   LABSPEC >1.046 (H) 04/01/2021 0338   PHURINE 5.0 04/01/2021 0338   GLUCOSEU NEGATIVE 04/01/2021 0338   HGBUR SMALL (A) 04/01/2021 0338   BILIRUBINUR SMALL (A) 04/01/2021 0338   KETONESUR 5 (A) 04/01/2021 0338   PROTEINUR 30 (A) 04/01/2021 0338   NITRITE NEGATIVE 04/01/2021 0338   LEUKOCYTESUR NEGATIVE 04/01/2021 0338   Sepsis Labs: @LABRCNTIP (procalcitonin:4,lacticidven:4)  ) Recent Results (from the past 240 hour(s))  Resp Panel by RT-PCR (Flu A&B, Covid) Nasopharyngeal Swab     Status: None   Collection Time: 03/31/21  3:48 PM   Specimen: Nasopharyngeal Swab; Nasopharyngeal(NP) swabs in vial transport medium  Result Value Ref Range Status   SARS Coronavirus 2 by RT PCR NEGATIVE NEGATIVE Final    Comment: (NOTE) SARS-CoV-2 target  nucleic acids are NOT DETECTED.  The SARS-CoV-2 RNA is generally detectable in upper respiratory specimens during the acute phase of infection. The lowest concentration of SARS-CoV-2 viral copies this assay can detect is 138 copies/mL. A negative result does not preclude SARS-Cov-2 infection and should not be used as the sole basis for treatment or other patient management decisions. A negative result may occur with  improper specimen collection/handling, submission of specimen other than nasopharyngeal swab, presence of viral mutation(s) within the areas targeted by this assay, and inadequate number of viral copies(<138 copies/mL). A negative result must be combined with clinical observations, patient history, and epidemiological information. The expected result is Negative.  Fact Sheet for Patients:  EntrepreneurPulse.com.au  Fact Sheet for Healthcare Providers:  IncredibleEmployment.be  This test is no t yet approved or cleared by the [place] FDA and  has been authorized for detection and/or diagnosis of SARS-CoV-2 by FDA under an Emergency Use Authorization (EUA). This EUA will remain  in effect (meaning this test can be used) for the duration of the COVID-19 declaration under Section 564(b)(1) of the Act,  21 U.S.C.section 360bbb-3(b)(1), unless the authorization is terminated  or revoked sooner.       Influenza A by PCR NEGATIVE NEGATIVE Final   Influenza B by PCR NEGATIVE NEGATIVE Final    Comment: (NOTE) The Xpert Xpress SARS-CoV-2/FLU/RSV plus assay is intended as an aid in the diagnosis of influenza from Nasopharyngeal swab specimens and should not be used as a sole basis for treatment. Nasal washings and aspirates are unacceptable for Xpert Xpress SARS-CoV-2/FLU/RSV testing.  Fact Sheet for Patients: EntrepreneurPulse.com.au  Fact Sheet for Healthcare  Providers: IncredibleEmployment.be  This test is not yet approved or cleared by the [place] FDA and has been authorized for detection and/or diagnosis of SARS-CoV-2 by FDA under an Emergency Use Authorization (EUA). This EUA will remain in effect (meaning this test can be used) for the duration of the COVID-19 declaration under Section 564(b)(1) of the Act, 21 U.S.C. section 360bbb-3(b)(1), unless the authorization is terminated or revoked.  Performed at [facility], [place] [address] [place], [place] [address]       Studies: No results found.  Scheduled Meds:  enoxaparin (LOVENOX) injection  40 mg Subcutaneous Q24H   gabapentin  1,200 mg Oral TID   LORazepam  1 mg Intravenous Once   metoprolol tartrate  12.5 mg Oral BID   pantoprazole  40 mg Oral Daily    Continuous Infusions:  lactated ringers 10 mL/hr at 04/01/21 1316   piperacillin-tazobactam (ZOSYN)  IV 3.375 g (04/02/21 0853)     LOS: 2 days     Kayleen Memos, MD [facility] Pager [phone]  If 7PM-7AM, please contact night-coverage [email or web address] Password [facility] 04/02/2021, 5:32 PM

## 2021-04-03 ENCOUNTER — Other Ambulatory Visit: Payer: Self-pay

## 2021-04-03 DIAGNOSIS — K8309 Other cholangitis: Secondary | ICD-10-CM | POA: Diagnosis not present

## 2021-04-03 DIAGNOSIS — K831 Obstruction of bile duct: Secondary | ICD-10-CM

## 2021-04-03 DIAGNOSIS — K8080 Other cholelithiasis without obstruction: Secondary | ICD-10-CM

## 2021-04-03 LAB — CBC
HCT: 37.3 % — ABNORMAL LOW (ref 39.0–52.0)
Hemoglobin: 12.5 g/dL — ABNORMAL LOW (ref 13.0–17.0)
MCH: 32.2 pg (ref 26.0–34.0)
MCHC: 33.5 g/dL (ref 30.0–36.0)
MCV: 96.1 fL (ref 80.0–100.0)
Platelets: 150 10*3/uL (ref 150–400)
RBC: 3.88 MIL/uL — ABNORMAL LOW (ref 4.22–5.81)
RDW: 13.1 % (ref 11.5–15.5)
WBC: 14.1 10*3/uL — ABNORMAL HIGH (ref 4.0–10.5)
nRBC: 0 % (ref 0.0–0.2)

## 2021-04-03 LAB — COMPREHENSIVE METABOLIC PANEL
ALT: 108 U/L — ABNORMAL HIGH (ref 0–44)
AST: 30 U/L (ref 15–41)
Albumin: 2.9 g/dL — ABNORMAL LOW (ref 3.5–5.0)
Alkaline Phosphatase: 119 U/L (ref 38–126)
Anion gap: 7 (ref 5–15)
BUN: 11 mg/dL (ref 8–23)
CO2: 25 mmol/L (ref 22–32)
Calcium: 8.2 mg/dL — ABNORMAL LOW (ref 8.9–10.3)
Chloride: 105 mmol/L (ref 98–111)
Creatinine, Ser: 0.71 mg/dL (ref 0.61–1.24)
GFR, Estimated: >60 mL/min (ref >60)
Glucose, Bld: 106 mg/dL — ABNORMAL HIGH (ref 70–99)
Potassium: 3.4 mmol/L — ABNORMAL LOW (ref 3.5–5.1)
Sodium: 137 mmol/L (ref 135–145)
Total Bilirubin: 1.3 mg/dL — ABNORMAL HIGH (ref 0.3–1.2)
Total Protein: 6.5 g/dL (ref 6.5–8.1)

## 2021-04-03 LAB — SURGICAL PATHOLOGY

## 2021-04-03 MED ORDER — CIPROFLOXACIN HCL 500 MG PO TABS
500.0000 mg | ORAL_TABLET | Freq: Two times a day (BID) | ORAL | 0 refills | Status: AC
Start: 2021-04-03 — End: 2021-04-09

## 2021-04-03 MED ORDER — POTASSIUM CHLORIDE CRYS ER 20 MEQ PO TBCR
40.0000 meq | EXTENDED_RELEASE_TABLET | Freq: Once | ORAL | Status: AC
  Administered 2021-04-03: 40 meq via ORAL
  Filled 2021-04-03: qty 2

## 2021-04-03 MED ORDER — METRONIDAZOLE 500 MG PO TABS: 500.0000 mg | ORAL_TABLET | Freq: Three times a day (TID) | ORAL | 0 refills | Status: DC

## 2021-04-03 MED ORDER — CIPROFLOXACIN HCL 500 MG PO TABS
500.0000 mg | ORAL_TABLET | Freq: Two times a day (BID) | ORAL | Status: DC
  Administered 2021-04-03: 500 mg via ORAL
  Filled 2021-04-03: qty 1

## 2021-04-03 MED ORDER — METRONIDAZOLE 500 MG PO TABS
500.0000 mg | ORAL_TABLET | Freq: Three times a day (TID) | ORAL | Status: DC
  Administered 2021-04-03: 500 mg via ORAL
  Filled 2021-04-03: qty 1

## 2021-04-03 NOTE — Telephone Encounter (Signed)
The pt has been scheduled for ERCP on 06/05/21 at 730 am at [facility] with [facility].  All information mailed to the pt home, sent to My Chart and will be discussed at the office visit on 11/16 with Alonza Bogus PA.

## 2021-04-03 NOTE — Discharge Summary (Signed)
Discharge Summary  DETROIT FRIEDEN IRJ:[id number] DOB: 22-Sep-1950  PCP: [name], MD  Admit date: 03/31/2021 Discharge date: 04/03/2021  Time spent: 35 minutes  Recommendations for Outpatient Follow-up:  Follow-up with GI Follow-up with your primary care provider.  Discharge Diagnoses:  Active Hospital Problems   Diagnosis Date Noted   Ascending cholangitis    Bile duct obstruction 03/31/2021   Duodenal stenosis 05/06/2018   CAD S/P percutaneous coronary angioplasty    Essential hypertension    Post-polio syndrome     Resolved Hospital Problems  No resolved problems to display.    Discharge Condition: Stable  Diet recommendation: Low-fat diet.  Vitals:   04/03/21 0435 04/03/21 0740  BP: 116/73 128/77  Pulse: 73 74  Resp: 20 18  Temp: 98.7 F (37.1 C) 98 F (36.7 C)  SpO2: 96% 94%    History of present illness:  70 year old male with medical history significant for postpolio syndrome, NSTEMI/CAD s/p stents, HTN, choledocholithiasis, s/p ERCP with sphincterotomy and balloon sweeping, cholecystectomy, duodenal stricture s/p dilatation, GERD, HLD, OSA not on CPAP, multiple other medical problems, presented to [facility] ED due to abdominal pain of 2 weeks duration.  Noted to have elevated LFTs and leukocytosis.  Due to concern for acute ascending cholangitis, GI was consulted, status post ERCP on 04/01/2021.  Doing well after his procedure.  Denies any abdominal pain, nausea or vomiting.  Tolerating a diet which was advanced by GI.     04/03/2021: Patient was seen and he had no new complaints.  There were no acute events overnight.  His wife is in the room, they are eager to go home.  Hospital Course:  Active Problems:   Post-polio syndrome   Essential hypertension   CAD S/P percutaneous coronary angioplasty   Duodenal stenosis   Bile duct obstruction   Ascending cholangitis  Acute ascending cholangitis status post ERCP on 04/01/2021. ERCP showed evidence of  dilated common bile duct containing numerous calculi.  Endoscopy biliary stent was placed in the common bile duct. LFTs are normalizing. Was treated with IV Zosyn, stopped on 04/03/2021. P.o. ciprofloxacin and p.o. Flagyl was started x6 days. Follow-up with GI outpatient. A repeat ERCP has been scheduled for 06/05/2021 at 7:30 AM at [facility].   Essential hypertension BPs stable Continue home Lopressor 12.5 mg twice daily Follow-up with your PCP.   GERD Continue home PPI 40 mg daily   Paroxysmal A. fib Continue home Lopressor for rate control   History of poliomyelitis/neuropathy Continue home gabapentin       Code Status: Full code   Family Communication: Wife at bedside    Consultants: GI   Procedures: ERCP   Antimicrobials: Zosyn, DC'd on 04/03/2021 Started on p.o. ciprofloxacin 500 mg twice daily and p.o. Flagyl 500 mg 3 times daily x6 days.    Discharge Exam: BP 128/77 (BP Location: Right Arm)   Pulse 74   Temp 98 F (36.7 C) (Oral)   Resp 18   Ht 5\' 11"  (1.803 m)   Wt 92.1 kg   SpO2 94%   BMI 28.31 kg/m  General: 70 y.o. year-old male well developed well nourished in no acute distress.  Alert and oriented x3. Cardiovascular: Regular rate and rhythm with no rubs or gallops.  No thyromegaly or JVD noted.   Respiratory: Clear to auscultation with no wheezes or rales. Good inspiratory effort. Abdomen: Soft nontender nondistended with normal bowel sounds x4 quadrants. Musculoskeletal: No lower extremity edema.  Skin: No ulcerative lesions noted or  rashes, Psychiatry: Mood is appropriate for condition and setting  Discharge Instructions You were cared for by a hospitalist during your hospital stay. If you have any questions about your discharge medications or the care you received while you were in the hospital after you are discharged, you can call the unit and asked to speak with the hospitalist on call if the hospitalist that took care of you is not  available. Once you are discharged, your primary care physician will handle any further medical issues. Please note that NO REFILLS for any discharge medications will be authorized once you are discharged, as it is imperative that you return to your primary care physician (or establish a relationship with a primary care physician if you do not have one) for your aftercare needs so that they can reassess your need for medications and monitor your lab values.   Allergies as of 04/03/2021   No Known Allergies      Medication List     TAKE these medications    acetaminophen 500 MG tablet Commonly known as: TYLENOL Take 1,000 mg by mouth every 6 (six) hours as needed for moderate pain or headache.   ciprofloxacin 500 MG tablet Commonly known as: CIPRO Take 1 tablet (500 mg total) by mouth 2 (two) times daily for 6 days.   Fish Oil 1000 MG Caps Take 1,000 mg by mouth daily.   gabapentin 600 MG tablet Commonly known as: NEURONTIN Take 2 tablets (1,200 mg total) by mouth 3 (three) times daily.   ibuprofen 200 MG tablet Commonly known as: ADVIL Take 400 mg by mouth every 6 (six) hours as needed for headache or moderate pain.   LUBRICANT EYE DROPS OP Place 2 drops into both eyes daily as needed (for dry eyes).   Magnesium 250 MG Tabs Take 1 tablet (250 mg total) by mouth at bedtime.   metoprolol tartrate 25 MG tablet Commonly known as: LOPRESSOR TAKE 12.5 MG ( 1/2 TABLET) TWICE A DAY . MAY TAKE AN ADDITIONAL 25 MG IF NEEDED FOR PALPS. What changed:  how much to take how to take this when to take this additional instructions   metroNIDAZOLE 500 MG tablet Commonly known as: FLAGYL Take 1 tablet (500 mg total) by mouth every 8 (eight) hours for 6 days.   multivitamin with minerals Tabs tablet Take 1 tablet by mouth daily.   omeprazole 40 MG capsule Commonly known as: PRILOSEC Take 1 capsule (40 mg total) by mouth daily.   rosuvastatin 40 MG tablet Commonly known as:  CRESTOR Take 1 tablet (40 mg total) by mouth daily.   sildenafil 100 MG tablet Commonly known as: VIAGRA TAKE 1/2 TO 1 TABLET BY MOUTH DAILY AS NEEDED FOR ERECTILE DYSFUNCTION What changed:  how much to take how to take this when to take this reasons to take this additional instructions   vitamin B-12 1000 MCG tablet Commonly known as: CYANOCOBALAMIN Take 1,000 mcg by mouth daily.   vitamin C 1000 MG tablet Take 1,000 mg by mouth daily.   Vitamin D3 50 MCG (2000 UT) Tabs Take 2,000 Units by mouth daily.   vitamin E 180 MG (400 UNITS) capsule Take 400 Units by mouth daily.       No Known Allergies  Follow-up Information     [name], MD. Call today.   Specialty: Family Medicine Why: Please call for a posthospital follow-up appointment. Contact information: [address] [place] [address] [phone]  Leonie Man, MD .   Specialty: Cardiology Contact information: [address] [place] [address] [phone]         Jackquline Denmark, MD. Call in 1 day(s).   Specialties: Gastroenterology, Internal Medicine Why: Please call for a post hospital follow-up appointment. Contact information: [place] [address] [phone]                  The results of significant diagnostics from this hospitalization (including imaging, microbiology, ancillary and laboratory) are listed below for reference.    Significant Diagnostic Studies: DG Chest 2 View  Result Date: 03/31/2021 CLINICAL DATA:  Shortness of breath. EXAM: CHEST - 2 VIEW COMPARISON:  Oct 18, 2020. FINDINGS: The heart size and mediastinal contours are within normal limits. Hypoinflation of the lungs is noted with mild bibasilar subsegmental atelectasis or scarring. The visualized skeletal structures are unremarkable. IMPRESSION: Hypoinflation of the lungs with mild bibasilar subsegmental atelectasis or  scarring. Electronically Signed   By: Marijo Conception M.D.   On: 03/31/2021 11:56   CT ABDOMEN PELVIS W CONTRAST  Result Date: 03/31/2021 CLINICAL DATA:  Left upper quadrant pain, acute EXAM: CT ABDOMEN AND PELVIS WITH CONTRAST TECHNIQUE: Multidetector CT imaging of the abdomen and pelvis was performed using the standard protocol following bolus administration of intravenous contrast. CONTRAST:  184mL OMNIPAQUE IOHEXOL 350 MG/ML SOLN COMPARISON:  01/07/2018 FINDINGS: Lower chest: Chronic interstitial reticulation, ground-glass attenuation, and mild traction bronchiectasis identified within the lung bases compatible with fibrotic interstitial lung disease. Hepatobiliary: Heterogeneous fatty deposition within the right hepatic lobe is identified. No focal liver lesion identified. There is progressive atrophy of the left lobe of liver since 01/07/2018. The portal vein to the left hepatic lobe is markedly diminished in caliber compared with the previous exam. Previous cholecystectomy. Intrahepatic bile duct dilatation is again noted. The common bile duct is increased in caliber measuring up to 1.4 cm. This is unchanged when compared with the previous exam. Several foci of pneumobilia identified within the left hepatic lobe and distal CBD. Pancreas: No focal pancreas lesion identified. No main duct dilatation or inflammation. Spleen: Normal in size without focal abnormality. Adrenals/Urinary Tract: Normal right adrenal gland. Left adrenal nodule measures 1.5 cm and is unchanged from previous exam, likely reflecting a benign adenoma. Posterior cortex left kidney cyst measures 1.6 cm. No nephrolithiasis or hydronephrosis. Urinary bladder is unremarkable. Stomach/Bowel: Stomach is within normal limits. Appendix appears normal. No evidence of bowel wall thickening, distention, or inflammatory changes. Vascular/Lymphatic: Aortic atherosclerosis. No aneurysm. No abdominopelvic adenopathy. Reproductive: Prostate is  unremarkable. Other: No free fluid or fluid collections. Musculoskeletal: Multilevel lumbar degenerative disc disease. Bilateral L5 pars defects identified with mild anterolisthesis of L5 on S1. Dorsal column stimulator identified with battery pack in the subcutaneous soft tissues of the left lower lumbar region. IMPRESSION: 1. No acute findings identified within the abdomen or pelvis. 2. Progressive atrophy of the left lobe of liver with diminished caliber of the portal vein to the left hepatic lobe. Etiology is indeterminate. Underlying infiltrative process such as cholangiocarcinoma cannot be excluded. When the patient is clinically stable and able to follow directions and hold their breath (preferably as an outpatient) further evaluation with dedicated contrast enhanced liver protocol MRI is recommended. 3. Chronic fibrotic interstitial lung disease. 4. Stable left adrenal nodule, likely benign adenoma. 5. Aortic Atherosclerosis (ICD10-I70.0). Electronically Signed   By: Kerby Moors M.D.   On: 03/31/2021 14:18   DG  ERCP BILIARY & PANCREATIC DUCTS  Result Date: 04/01/2021 CLINICAL DATA:  Biliary dilatation, cholangitis and choledocholithiasis. EXAM: ERCP TECHNIQUE: Multiple spot images obtained with the fluoroscopic device and submitted for interpretation post-procedure. COMPARISON:  CT and ultrasound studies on 03/31/2021 FINDINGS: Imaging with a C-arm demonstrates cannulation of the common bile duct with contrast injection demonstrating diffuse dilatation of the common bile duct and numerous filling defects in the common duct consistent with calculi. An endoscopic biliary stent was placed spanning the extent of biliary dilatation and filling defects. IMPRESSION: Evidence of dilated common bile duct containing numerous calculi. An endoscopic biliary stent was placed in the common bile duct. These images were submitted for radiologic interpretation only. Please see the procedural report for the amount of  contrast and the fluoroscopy time utilized. Electronically Signed   By: Aletta Edouard M.D.   On: 04/01/2021 16:31   US Abdomen Limited RUQ (LIVER/GB)  Result Date: 03/31/2021 CLINICAL DATA:  Transaminitis. EXAM: ULTRASOUND ABDOMEN LIMITED RIGHT UPPER QUADRANT COMPARISON:  None. FINDINGS: Gallbladder: Removed. Common bile duct: Diameter: 14.2 mm Liver: Left lobe not well visualized. No focal lesion identified. Heterogeneous increased echotexture. Portal vein is patent on color Doppler imaging with normal direction of blood flow towards the liver. Other: None. IMPRESSION: 1. Heterogeneous liver with increased echotexture worrisome for diffuse hepatocellular disease. 2. Common bile duct is dilated measuring 14 mm this is similar to the prior CT. 3. Cholecystectomy. Electronically Signed   By: Ronney Asters M.D.   On: 03/31/2021 17:31    Microbiology: Recent Results (from the past 240 hour(s))  Resp Panel by RT-PCR (Flu A&B, Covid) Nasopharyngeal Swab     Status: None   Collection Time: 03/31/21  3:48 PM   Specimen: Nasopharyngeal Swab; Nasopharyngeal(NP) swabs in vial transport medium  Result Value Ref Range Status   SARS Coronavirus 2 by RT PCR NEGATIVE NEGATIVE Final    Comment: (NOTE) SARS-CoV-2 target nucleic acids are NOT DETECTED.  The SARS-CoV-2 RNA is generally detectable in upper respiratory specimens during the acute phase of infection. The lowest concentration of SARS-CoV-2 viral copies this assay can detect is 138 copies/mL. A negative result does not preclude SARS-Cov-2 infection and should not be used as the sole basis for treatment or other patient management decisions. A negative result may occur with  improper specimen collection/handling, submission of specimen other than nasopharyngeal swab, presence of viral mutation(s) within the areas targeted by this assay, and inadequate number of viral copies(<138 copies/mL). A negative result must be combined with clinical  observations, patient history, and epidemiological information. The expected result is Negative.  Fact Sheet for Patients:  EntrepreneurPulse.com.au  Fact Sheet for Healthcare Providers:  IncredibleEmployment.be  This test is no t yet approved or cleared by the [place] FDA and  has been authorized for detection and/or diagnosis of SARS-CoV-2 by FDA under an Emergency Use Authorization (EUA). This EUA will remain  in effect (meaning this test can be used) for the duration of the COVID-19 declaration under Section 564(b)(1) of the Act, 21 U.S.C.section 360bbb-3(b)(1), unless the authorization is terminated  or revoked sooner.       Influenza A by PCR NEGATIVE NEGATIVE Final   Influenza B by PCR NEGATIVE NEGATIVE Final    Comment: (NOTE) The Xpert Xpress SARS-CoV-2/FLU/RSV plus assay is intended as an aid in the diagnosis of influenza from Nasopharyngeal swab specimens and should not be used as a sole basis for treatment. Nasal washings and aspirates are unacceptable for Xpert Xpress SARS-CoV-2/FLU/RSV testing.  Fact Sheet for Patients: EntrepreneurPulse.com.au  Fact Sheet for Healthcare Providers: IncredibleEmployment.be  This test is not yet approved or cleared by the [place] FDA and has been authorized for detection and/or diagnosis of SARS-CoV-2 by FDA under an Emergency Use Authorization (EUA). This EUA will remain in effect (meaning this test can be used) for the duration of the COVID-19 declaration under Section 564(b)(1) of the Act, 21 U.S.C. section 360bbb-3(b)(1), unless the authorization is terminated or revoked.  Performed at [facility], [place] [address] [place], [place] [address]      Labs: Basic Metabolic Panel: Recent Labs  Lab 03/31/21 1123 04/01/21 0338 04/02/21 0138 04/03/21 0037  NA 136 135 135 137  K 3.7 3.5 3.7 3.4*  CL 103 104 105 105  CO2 19* 21* 22 25   GLUCOSE 94 89 127* 106*  BUN 17 24* 18 11  CREATININE 0.83 0.78 0.62 0.71  CALCIUM 9.5 8.9 8.6* 8.2*   Liver Function Tests: Recent Labs  Lab 03/31/21 1123 04/01/21 0338 04/02/21 0138 04/03/21 0037  AST 236* 106* 50* 30  ALT 367* 247* 162* 108*  ALKPHOS 158* 123 123 119  BILITOT 5.8* 4.5* 2.1* 1.3*  PROT 7.5 7.0 6.7 6.5  ALBUMIN 3.9 3.3* 3.0* 2.9*   Recent Labs  Lab 03/31/21 1123  LIPASE 22   No results for input(s): AMMONIA in the last 168 hours. CBC: Recent Labs  Lab 03/31/21 1123 04/01/21 0338 04/02/21 0138 04/03/21 0037  WBC 15.9* 19.8* 20.5* 14.1*  NEUTROABS 14.8*  --   --   --   HGB 14.7 13.3 12.7* 12.5*  HCT 42.8 39.0 37.8* 37.3*  MCV 95.3 95.8 95.7 96.1  PLT 231 146* 127* 150   Cardiac Enzymes: No results for input(s): CKTOTAL, CKMB, CKMBINDEX, TROPONINI in the last 168 hours. BNP: BNP (last 3 results) No results for input(s): BNP in the last 8760 hours.  ProBNP (last 3 results) No results for input(s): PROBNP in the last 8760 hours.  CBG: No results for input(s): GLUCAP in the last 168 hours.     Signed:  Kayleen Memos, MD [facility] 04/03/2021, 5:59 PM

## 2021-04-03 NOTE — Plan of Care (Signed)
  Problem: Activity: Goal: Risk for activity intolerance will decrease Outcome: Progressing   Problem: Pain Managment: Goal: General experience of comfort will improve Outcome: Progressing   Problem: Clinical Measurements: Goal: Ability to maintain clinical measurements within normal limits will improve Outcome: Progressing

## 2021-04-03 NOTE — Progress Notes (Signed)
Esperanza Richters Kauth to be D/C'd  per MD order.  Discussed with the patient and all questions fully answered.  VSS, Skin clean, dry and intact without evidence of skin break down, no evidence of skin tears noted.  IV catheter discontinued intact. Site without signs and symptoms of complications. Dressing and pressure applied.  An After Visit Summary was printed and given to the patient.   D/c education completed with patient/family including follow up instructions, medication list, d/c activities limitations if indicated, with other d/c instructions as indicated by MD - patient able to verbalize understanding, all questions fully answered.   Patient instructed to return to ED, call 911, or call MD for any changes in condition.   Patient to be escorted via [place], and D/C home via private auto.

## 2021-04-04 ENCOUNTER — Telehealth: Payer: Self-pay

## 2021-04-04 NOTE — Telephone Encounter (Signed)
Transition Care Management Follow-up Telephone Call Date of discharge and from where: 04/03/21 from [facility] How have you been since you were released from the hospital? Patient states feeling stronger.  Any questions or concerns? No  Items Reviewed: Did the pt receive and understand the discharge instructions provided? Yes  Medications obtained and verified? Yes  Other? No  Any new allergies since your discharge? No  Dietary orders reviewed? Yes Do you have support at home? Yes , wife  Home Care and Equipment/Supplies: Were home health services ordered? no If so, what is the name of the agency? N/A  Has the agency set up a time to come to the patient's home? not applicable Were any new equipment or medical supplies ordered?  No What is the name of the medical supply agency? N/A Were you able to get the supplies/equipment? not applicable Do you have any questions related to the use of the equipment or supplies? No  Functional Questionnaire: (I = Independent and D = Dependent) ADLs: I with assistance from spouse  Bathing/Dressing- I  Meal Prep- I with assistance from spouse  Eating- I  Maintaining continence- I  Transferring/Ambulation- I with assistance of crutches  Managing Meds- I with assistance from spouse  Follow up appointments reviewed:  PCP Hospital f/u appt confirmed? Yes  Scheduled to see Dr. Biagio Borg on 04/09/21 @ 11:30am. [facility] f/u appt confirmed? Yes  Scheduled to see Dr. Ceasar Mons on 04/16/21 @ 2:30. Are transportation arrangements needed? No  If their condition worsens, is the pt aware to call PCP or go to the Emergency Dept.? Yes Was the patient provided with contact information for the PCP's office or ED? Yes Was to pt encouraged to call back with questions or concerns? Yes

## 2021-04-09 ENCOUNTER — Encounter: Payer: Self-pay | Admitting: Family Medicine

## 2021-04-09 ENCOUNTER — Other Ambulatory Visit: Payer: Self-pay

## 2021-04-09 ENCOUNTER — Ambulatory Visit (INDEPENDENT_AMBULATORY_CARE_PROVIDER_SITE_OTHER): Payer: Medicare Other | Admitting: Family Medicine

## 2021-04-09 VITALS — BP 132/70 | HR 59 | Temp 97.5°F | Ht 71.0 in | Wt 219.2 lb

## 2021-04-09 DIAGNOSIS — K315 Obstruction of duodenum: Secondary | ICD-10-CM | POA: Diagnosis not present

## 2021-04-09 DIAGNOSIS — Z9889 Other specified postprocedural states: Secondary | ICD-10-CM | POA: Diagnosis not present

## 2021-04-09 DIAGNOSIS — K8309 Other cholangitis: Secondary | ICD-10-CM

## 2021-04-09 LAB — CBC WITH DIFFERENTIAL/PLATELET
Basophils Absolute: 0.1 10*3/uL (ref 0.0–0.1)
Basophils Relative: 0.5 % (ref 0.0–3.0)
Eosinophils Absolute: 0.3 10*3/uL (ref 0.0–0.7)
Eosinophils Relative: 2.1 % (ref 0.0–5.0)
HCT: 42.3 % (ref 39.0–52.0)
Hemoglobin: 14.2 g/dL (ref 13.0–17.0)
Lymphocytes Relative: 20.9 % (ref 12.0–46.0)
Lymphs Abs: 2.6 10*3/uL (ref 0.7–4.0)
MCHC: 33.6 g/dL (ref 30.0–36.0)
MCV: 96.6 fl (ref 78.0–100.0)
Monocytes Absolute: 0.7 10*3/uL (ref 0.1–1.0)
Monocytes Relative: 6 % (ref 3.0–12.0)
Neutro Abs: 8.6 10*3/uL — ABNORMAL HIGH (ref 1.4–7.7)
Neutrophils Relative %: 70.5 % (ref 43.0–77.0)
Platelets: 362 10*3/uL (ref 150.0–400.0)
RBC: 4.38 Mil/uL (ref 4.22–5.81)
RDW: 13.7 % (ref 11.5–15.5)
WBC: 12.2 10*3/uL — ABNORMAL HIGH (ref 4.0–10.5)

## 2021-04-09 LAB — COMPREHENSIVE METABOLIC PANEL
ALT: 36 U/L (ref 0–53)
AST: 19 U/L (ref 0–37)
Albumin: 3.9 g/dL (ref 3.5–5.2)
Alkaline Phosphatase: 96 U/L (ref 39–117)
BUN: 10 mg/dL (ref 6–23)
CO2: 27 mEq/L (ref 19–32)
Calcium: 8.7 mg/dL (ref 8.4–10.5)
Chloride: 102 mEq/L (ref 96–112)
Creatinine, Ser: 0.62 mg/dL (ref 0.40–1.50)
GFR: 96.44 mL/min (ref >60.00)
Glucose, Bld: 112 mg/dL — ABNORMAL HIGH (ref 70–99)
Potassium: 4 mEq/L (ref 3.5–5.1)
Sodium: 137 mEq/L (ref 135–145)
Total Bilirubin: 0.7 mg/dL (ref 0.2–1.2)
Total Protein: 7.4 g/dL (ref 6.0–8.3)

## 2021-04-09 NOTE — Progress Notes (Signed)
Patient ID: [name], male    DOB: [birth year], 70 y.o.   MRN: [id number]  This visit was conducted in person.  BP 132/70   Pulse (!) 59   Temp (!) 97.5 F (36.4 C) (Temporal)   Ht 5\' 11"  (1.803 m)   Wt 219 lb 3 oz (99.4 kg) Comment: Wearing leg braces  SpO2 97%   BMI 30.57 kg/m    CC: hosp f/u visit  Subjective:   HPI: Alexander [name] is a 70 y.o. male presenting on 04/09/2021 for Hospitalization Follow-up (Admitted on 03/31/21 at [facility], dx transaminitis; cholecystitis. Pt accompanied by wife, Robyn- temp 97.8.)   Recent hospitalization for abdominal pain found to have elevated white count and liver function. Concern for ascending cholangitis so treated with IV zosyn and underwent ERCP on 04/01/2021 [facility]) showing dilated CBD with multiple stones as well as duodenal stenosis. L liver atrophy thought ischemic in nature. Biliary stent was placed. IV abx transitioned to oral cipro and flagyl. Liver function normalized. Planning to see GI in follow up with repeat ERCP planned 06/2021.  Alexander records reviewed. Med rec performed.   He is [name]/p cholecystectomy 2017.  He had complicated hospitalization for similar symptoms and dx acute cholangitis with Alexander coli bacteremia and septic shock back in 12/2017.   Since home, staying fatigued with leg weakness.  No fevers/chills, abd pain, nausea/vomiting.  Notes easy exertional dyspnea attributed to deconditioning.  He notes he'[name] not drinking water as well as he should. Planning to restart incentive spirometry.   Home health not needed.  Other follow up appointments scheduled: GI (Mansouraty) 06/2021 ______________________________________________________________________ Admit date: 03/31/2021 Discharge date: 04/03/2021 TCM f/u phone call: performed on 04/04/2021  Recommendations for Outpatient Follow-up:  1. Follow-up with GI 2. Follow-up with your primary care provider.   Discharge Diagnoses:  Active Alexander Problems   Diagnosis Date  Noted  Ascending cholangitis    Bile duct obstruction 03/31/2021  Duodenal stenosis 05/06/2018  CAD [name]/P percutaneous coronary angioplasty    Essential hypertension    Post-polio syndrome   Discharge Condition: Stable Diet recommendation: Low-fat diet     Relevant past medical, surgical, family and social history reviewed and updated as indicated. Interim medical history since our last visit reviewed. Allergies and medications reviewed and updated. Outpatient Medications Prior to Visit  Medication Sig Dispense Refill   acetaminophen (TYLENOL) 500 MG tablet Take 1,000 mg by mouth every 6 (six) hours as needed for moderate pain or headache.      Ascorbic Acid (VITAMIN C) 1000 MG tablet Take 1,000 mg by mouth daily.     Carboxymethylcellulose Sodium (LUBRICANT EYE DROPS OP) Place 2 drops into both eyes daily as needed (for dry eyes).     Cholecalciferol (VITAMIN D3) 50 MCG (2000 UT) TABS Take 2,000 Units by mouth daily.     ciprofloxacin (CIPRO) 500 MG tablet Take 1 tablet (500 mg total) by mouth 2 (two) times daily for 6 days. 12 tablet 0   gabapentin (NEURONTIN) 600 MG tablet Take 2 tablets (1,200 mg total) by mouth 3 (three) times daily. 540 tablet 3   ibuprofen (ADVIL) 200 MG tablet Take 400 mg by mouth every 6 (six) hours as needed for headache or moderate pain.     Magnesium 250 MG TABS Take 1 tablet (250 mg total) by mouth at bedtime. (Patient taking differently: Take 250 mg by mouth at bedtime.)  0   metoprolol tartrate (LOPRESSOR) 25 MG tablet TAKE 12.5 MG ( 1/2  TABLET) TWICE A DAY . MAY TAKE AN ADDITIONAL 25 MG IF NEEDED FOR PALPS. (Patient taking differently: Take 12.5 mg by mouth 2 (two) times daily.) 120 tablet 3   Multiple Vitamin (MULTIVITAMIN WITH MINERALS) TABS tablet Take 1 tablet by mouth daily.     Omega-3 Fatty Acids (FISH OIL) 1000 MG CAPS Take 1,000 mg by mouth daily.      omeprazole (PRILOSEC) 40 MG capsule Take 1 capsule (40 mg total) by mouth daily. 90 capsule 3    rosuvastatin (CRESTOR) 40 MG tablet Take 1 tablet (40 mg total) by mouth daily. 90 tablet 3   sildenafil (VIAGRA) 100 MG tablet TAKE 1/2 TO 1 TABLET BY MOUTH DAILY AS NEEDED FOR ERECTILE DYSFUNCTION (Patient taking differently: Take 50-100 mg by mouth daily as needed for erectile dysfunction.) 6 tablet 0   vitamin B-12 (CYANOCOBALAMIN) 1000 MCG tablet Take 1,000 mcg by mouth daily.     vitamin Alexander 400 UNIT capsule Take 400 Units by mouth daily.     metroNIDAZOLE (FLAGYL) 500 MG tablet Take 1 tablet (500 mg total) by mouth every 8 (eight) hours for 6 days. 18 tablet 0   No facility-administered medications prior to visit.     Per HPI unless specifically indicated in ROS section below Review of Systems  Objective:  BP 132/70   Pulse (!) 59   Temp (!) 97.5 F (36.4 C) (Temporal)   Ht 5\' 11"  (1.803 m)   Wt 219 lb 3 oz (99.4 kg) Comment: Wearing leg braces  SpO2 97%   BMI 30.57 kg/m   Wt Readings from Last 3 Encounters:  04/09/21 219 lb 3 oz (99.4 kg)  04/01/21 203 lb (92.1 kg)  10/18/20 220 lb 6 oz (100 kg)      Physical Exam Vitals and nursing note reviewed.  Constitutional:      Appearance: Normal appearance. He is not ill-appearing.  Cardiovascular:     Rate and Rhythm: Normal rate and regular rhythm.     Pulses: Normal pulses.     Heart sounds: Normal heart sounds. No murmur heard. Pulmonary:     Effort: Pulmonary effort is normal. No respiratory distress.     Breath sounds: Normal breath sounds. No wheezing, rhonchi or rales.     Comments: RLL crackles Abdominal:     General: Bowel sounds are normal. There is no distension.     Palpations: Abdomen is soft. There is no mass.     Tenderness: There is abdominal tenderness (mild) in the left lower quadrant. There is no right CVA tenderness, left CVA tenderness, guarding or rebound. Negative signs include Murphy'[name] sign.  Musculoskeletal:     Right lower leg: No edema.     Left lower leg: No edema.     Comments: Leg braces  present bilaterally  Neurological:     Mental Status: He is alert.      Results for orders placed or performed in visit on 04/09/21  Comprehensive metabolic panel  Result Value Ref Range   Sodium 137 135 - 145 mEq/L   Potassium 4.0 3.5 - 5.1 mEq/L   Chloride 102 96 - 112 mEq/L   CO2 27 19 - 32 mEq/L   Glucose, Bld 112 (H) 70 - 99 mg/dL   BUN 10 6 - 23 mg/dL   Creatinine, Ser 0.62 0.40 - 1.50 mg/dL   Total Bilirubin 0.7 0.2 - 1.2 mg/dL   Alkaline Phosphatase 96 39 - 117 U/L   AST 19 0 - 37 U/L  ALT 36 0 - 53 U/L   Total Protein 7.4 6.0 - 8.3 Alexander/dL   Albumin 3.9 3.5 - 5.2 Alexander/dL   GFR 96.44 >60.00 mL/min   Calcium 8.7 8.4 - 10.5 mg/dL  CBC with Differential/Platelet  Result Value Ref Range   WBC 12.2 (H) 4.0 - 10.5 K/uL   RBC 4.38 4.22 - 5.81 Mil/uL   Hemoglobin 14.2 13.0 - 17.0 Alexander/dL   HCT 42.3 39.0 - 52.0 %   MCV 96.6 78.0 - 100.0 fl   MCHC 33.6 30.0 - 36.0 Alexander/dL   RDW 13.7 11.5 - 15.5 %   Platelets 362.0 150.0 - 400.0 K/uL   Neutrophils Relative % 70.5 43.0 - 77.0 %   Lymphocytes Relative 20.9 12.0 - 46.0 %   Monocytes Relative 6.0 3.0 - 12.0 %   Eosinophils Relative 2.1 0.0 - 5.0 %   Basophils Relative 0.5 0.0 - 3.0 %   Neutro Abs 8.6 (H) 1.4 - 7.7 K/uL   Lymphs Abs 2.6 0.7 - 4.0 K/uL   Monocytes Absolute 0.7 0.1 - 1.0 K/uL   Eosinophils Absolute 0.3 0.0 - 0.7 K/uL   Basophils Absolute 0.1 0.0 - 0.1 K/uL    Assessment & Plan:  This visit occurred during the SARS-CoV-2 public health emergency.  Safety protocols were in place, including screening questions prior to the visit, additional usage of staff PPE, and extensive cleaning of exam room while observing appropriate contact time as indicated for disinfecting solutions.   Problem List Items Addressed This Visit     Duodenal stenosis    Followed by GI. Continues PPI daily.       [name]/P ERCP   Ascending cholangitis - Primary    Completing oral antibiotics (cipro/flagyl) and overall improving. Update labs today.  Reviewed f/u plan - with repeat ERCP 06/2021. Discussed limiting fatty greasy foods. Appreciate GI care.  Reviewed importance of notifying medical team if any recurrent abdominal pain.       Relevant Orders   Comprehensive metabolic panel (Completed)   CBC with Differential/Platelet (Completed)     No orders of the defined types were placed in this encounter.  Orders Placed This Encounter  Procedures   Comprehensive metabolic panel   CBC with Differential/Platelet     Patient Instructions  Labs today  Finish up antibiotic and keep GI appointments.  Continue good water intake and avoid greasy fried foods.  Let us or GI know if any new or worsening abdominal pain, nausea, vomiting or fever develops.   Follow up plan: No follow-ups on file.  [name], MD

## 2021-04-09 NOTE — Patient Instructions (Addendum)
Labs today  Finish up antibiotic and keep GI appointments.  Continue good water intake and avoid greasy fried foods.  Let us or GI know if any new or worsening abdominal pain, nausea, vomiting or fever develops.

## 2021-04-10 NOTE — Assessment & Plan Note (Addendum)
Completing oral antibiotics (cipro/flagyl) and overall improving. Update labs today. Reviewed f/u plan - with repeat ERCP 06/2021. Discussed limiting fatty greasy foods. Appreciate GI care.  Reviewed importance of notifying medical team if any recurrent abdominal pain.

## 2021-04-10 NOTE — Assessment & Plan Note (Addendum)
Followed by GI. Continues PPI daily.

## 2021-04-16 ENCOUNTER — Encounter: Payer: Self-pay | Admitting: Gastroenterology

## 2021-04-16 ENCOUNTER — Ambulatory Visit: Payer: Medicare Other | Admitting: Gastroenterology

## 2021-04-16 ENCOUNTER — Ambulatory Visit: Payer: Medicare Other | Admitting: Family Medicine

## 2021-04-16 VITALS — BP 100/60 | HR 55 | Ht 71.0 in | Wt 220.4 lb

## 2021-04-16 DIAGNOSIS — K831 Obstruction of bile duct: Secondary | ICD-10-CM | POA: Diagnosis not present

## 2021-04-16 NOTE — Progress Notes (Signed)
Attending Physician's Attestation   I have reviewed the chart.   I agree with the Advanced Practitioner's note, impression, and recommendations with any updates as below.    Tyquavious Gamel Mansouraty, MD [facility] Office # [phone]  

## 2021-04-16 NOTE — Progress Notes (Signed)
04/16/2021 Alexander [name] [id number] 24-Dec-1950   HISTORY OF PRESENT ILLNESS: This is a 70 year old male who is here for hospital follow-up of his ascending cholangitis related to CBD stones.  He had a complicated history with this in 2019.  He presented back to the hospital on 03/31/21 with upper abdominal pain and jaundice.  Underwent ERCP with Dr. Lyndel Safe on 11/1:  - Ascending cholangitis s/p Endo biliary stenting - Choledocholithiasis was found. Removal was not attempted; a stent was inserted. - J-shaped stomach. - Duodenal stenosis. - Previous cholecystectomy.  He says that he will feels well from a GI standpoint.  No fevers or abdominal pain.  His appetite is good.  He does get fatigued very easily, but that happens on a regular basis anyways.  He is blood pressure and pulse were low today.  His wife told me that he has been taking his metoprolol 25 mg all at one time instead of taking 12.5 mg twice daily.  LFTs last week were completely normal.   PREVIOUS ENDOSCOPIC EVALUATIONS  / IMAGING STUDIES    ERCP for cholangitis and bile duct stones -Duodenal stenosis. - Ascending cholangitis due to multiple filling defects consistent with a stones and sludge status post limited biliary sphincterotomy and endobiliary stent insertion with good drainage   ERCP for cholangitis follow up Sept 2019 -J-shaped deformity in the entire stomach. - Acquired duodenal stenosis just distal to the ampulla, precluding stable positioning. - Prior biliary sphincterotomy appeared open. - One partially occluded stent from the biliary tree was seen in the major papilla. This was removed. - The fluoroscopic examination was suspicious for sludge. - The entire main bile duct was moderately dilated, with an obstruction. - Choledocholithiasis/sludge balls were found. Partial removal was accomplished with biliarysphincterotomy extension, sphincteroplasty and balloon sweep with stent insertion at the end of the  procedure     ERCP for follow up on cholangitis and biliary stent removal October 2019 --No gross lesions in esophagus. - J-shaped deformity in the entire stomach. - No gross lesions in the duodenal bulb. - Acquired duodenal stenosis. - Prior biliary sphincterotomy appeared open. - One partially occluded stent from the biliary tree was seen in the major papilla. This stent was removed. - A filling defect consistent with a stone and sludge was seen on the cholangiogram. - A mild narrowing was found in the lower third of the main bile duct. The stricture was inflammatory appearing. This region was able to be traversed with a fully inflated retrieval balloon. This area was dilated and then brushed for cells for cytology evaluation. - The upper third of the main bile duct and middle third of the main bile duct were moderately dilated. - Choledocholithiasis and biliary sludge/debris was found. Complete removal was accomplished by balloon sphincteroplasty and then significant balloon sweeping   ** cytology- BILE DUCT BRUSHING DISTAL,STENT(SPECIMEN 1 OF 1 COLLECTED 03/30/18):ATYPICAL CELLS PRESENT, SEE COMMENT.     EGD Jan 2020 treatment of duodenal stenosis -A few, white nummular lesions in esophageal mucosa but otherwise normal. Biopsied for EoE and Candida rule out. - Z-line regular, 40 cm from the incisors. - J-shaped deformity in the entire stomach. - No other gross lesions in the stomach. Biopsied for HP. - Normal duodenal bulb. - Acquired duodenal stenosis. Biopsied. Dilated to 16.5 mm. - Patent sphincterotomy, characterized by healthy appearing mucosa was found. - No other gross lesions in the second portion of the duodenum and in the third portion of the duodenum. -  Biopsies were taken with a cold forceps for histology in the duodenal bulb, in the first portion of the duodenum, in the second portion of the duodenum and in the third portion of the duodenum for Celiac/Enteropathy rule  out.   Polyp Surveillance colonoscopy January 2020  Complete exam.BBPS 8 Non-thrombosed internal hemorrhoids and perianal erythema found on digital rectal exam. - The examined portion of the ileum was normal. Biopsied. - One 2 mm polyp at the hepatic flexure, removed with a jumbo cold forceps. Resected and retrieved. - Normal mucosa in the entire examined colon. Biopsied. - Normal mucosa in the rectum. Biopsied. - Non-bleeding non-thrombosed internal hemorrhoids.   Duodenum, Biopsy - BENIGN DUODENAL MUCOSA - NO ACUTE INFLAMMATION, VILLOUS BLUNTING OR INCREASED INTRAEPITHELIAL LYMPHOCYTES 2. Duodenum, Biopsy, Stricture - BENIGN DUODENAL MUCOSA - NO ACUTE INFLAMMATION, VILLOUS BLUNTING OR INCREASED INTRAEPITHELIAL LYMPHOCYTES 3. Stomach, biopsy - REACTIVE GASTROPATHY WITH FEATURES CONSISTENT WITH PROTON PUMP INHIBITOR EFFECT - NO H. PYLORI OR INTESTINAL METAPLASIA IDENTIFIED - SEE COMMENT 4. Esophagus, biopsy - REACTIVE SQUAMOUS MUCOSA WITH INCREASED INTRAEPITHELIAL LYMPHOCYTES - SEE COMMENT 5. Ileum, biopsy, Terminal Ileum - SMALL BOWEL MUCOSA WITH LYMPHOID AGGREGATES - NO ACUTE INFLAMMATION, GRANULOMAS OR MALIGNANCY IDENTIFIED 6. Colon, biopsy, Random - BENIGN COLONIC MUCOSA - NO ACTIVE INFLAMMATION OR EVIDENCE OF MICROSCOPIC COLITIS - NO HIGH GRADE DYSPLASIA OR MALIGNANCY IDENTIFIED 7. Colon, polyp(s), Hepatic Flexure - TUBULAR ADENOMA (1 OF 2 FRAGMENTS) - BENIGN COLONIC MUCOSA (1 OF 2 FRAGMENTS) - NO HIGH GRADE DYSPLASIA OR MALIGNANCY IDENTIFIED 8. Rectum, biopsy - BENIGN COLONIC MUCOSA - NO ACTIVE INFLAMMATION OR EVIDENCE OF MICROSCOPIC COLITIS - NO HIGH GRADE DYSPLASIA OR MALIGNANCY IDENTIFIED 1 of   **Stain negative for H.pylori   EGD March 2022 follow up on stenosis No gross lesions in esophagus. - J-shaped gastric deformity. - Acquired duodenal stenosis just proximal to the ampulla. Dilated to 18 mm sequentially. - Patent sphincterotomy was found. - Mild  deformity in the angle/turns of the D2/D3 region - not clear if a result of adhesive disease. However, normal mucosa was found in the second and third portions of the Duodenum.  Past Medical History:  Diagnosis Date   Acute cholecystitis 03/2016   with sepsis   Arthritis    CAD S/P percutaneous coronary angioplasty 1995, 2000, 2005   a. 1995 s/p BMS;  b. 2000 s/p BMS;  c. 2005 s/p stent - All stents in [facility] (RCA, LAD & OM - unknown on which date);  d. 04/2015 NSTEMI/Cath: LM nl, ost/p LAD 20%, patent mLAD stent, RI small, OM2 patent stent, pRCA 20%, patent stent, EF 45-50%-->Med Rx.   Calculus of bile duct with acute cholangitis with obstruction    Chronic pain syndrome    a. Followed @ [facility];  b. Uses 12-14 excedrins per day.   Duodenal stricture    Essential hypertension    Fatty liver    Gallstones    GERD (gastroesophageal reflux disease)    History of depression    History of post poliomyelitis muscular atrophy    HLD (hyperlipidemia)    Ischemic cardiomyopathy    a. 04/2015 Echo: EF 40-45%, mod antsept, ant, antlat, apical HK, mild AI/MR, PASP 72mmHg.   Migraines    Neuropathy    due to post polio syndrome   NSTEMI (non-ST elevated myocardial infarction) ([place]) 04/2015   - Demand Ischemia in setting of Sepsis (also with prior MI history   OSA (obstructive sleep apnea)    does not want to use  CPAP, using mouth guard   PAF (paroxysmal atrial fibrillation) ([place])    a. 04/2015 in setting of cholecystits and sepsis -->Amio;  b. CHA2DS2VASc = 4--> Eliquis.   Pneumonia 04/2015   Post-polio syndrome    a. ambulates with braces.   Septic shock ([place]) 01/07/2018   Shoulder pain, right    Past Surgical History:  Procedure Laterality Date   BALLOON DILATION N/A 06/06/2018   Procedure: BALLOON DILATION;  Surgeon: Rush Landmark Telford Nab., MD;  Location: [place];  Service: Gastroenterology;  Laterality: N/A;   BALLOON DILATION N/A 07/06/2018   Procedure: BALLOON  DILATION;  Surgeon: Rush Landmark Telford Nab., MD;  Location: [place];  Service: Gastroenterology;  Laterality: N/A;   BILIARY STENT PLACEMENT  01/08/2018   Procedure: BILIARY STENT PLACEMENT;  Surgeon: Jackquline Denmark, MD;  Location: [facility] ENDOSCOPY;  Service: Endoscopy;;   BILIARY STENT PLACEMENT N/A 02/09/2018   Procedure: BILIARY STENT PLACEMENT;  Surgeon: Irving Copas., MD;  Location: [facility] ENDOSCOPY;  Service: Gastroenterology;  Laterality: N/A;   BILIARY STENT PLACEMENT  04/01/2021   Procedure: BILIARY STENT PLACEMENT;  Surgeon: Jackquline Denmark, MD;  Location: [facility] ENDOSCOPY;  Service: Endoscopy;;   BIOPSY  06/06/2018   Procedure: BIOPSY;  Surgeon: Irving Copas., MD;  Location: [facility] ENDOSCOPY;  Service: Gastroenterology;;   BIOPSY  04/01/2021   Procedure: BIOPSY;  Surgeon: Jackquline Denmark, MD;  Location: [facility] ENDOSCOPY;  Service: Endoscopy;;   CARDIAC CATHETERIZATION N/A 04/30/2015   Procedure: Left Heart Cath and Coronary Angiography;  Surgeon: Troy Sine, MD;  Location: [place] CV LAB;  Service: Cardiovascular; LM nl, ost/p LAD 20%, patent mLAD stent, RI small, OM2 patent stent, pRCA 20%, patent stent, EF 45-50%-->Med Rx    cervical spine stimulator  08/2012   CHOLECYSTECTOMY  06/10/2015   Procedure: LAPAROSCOPIC CHOLECYSTECTOMY;  Surgeon: Arta Bruce Kinsinger, MD;  Location: [place];  Service: General;;   COLONOSCOPY  09/2013   TA x2, rpt 5 yrs ([place])   COLONOSCOPY WITH ESOPHAGOGASTRODUODENOSCOPY (EGD)  06/2018   reactive gastropathy, tubular adenoma, rpt colonoscopy 5 yrs ([place])   COLONOSCOPY WITH PROPOFOL N/A 06/06/2018   Procedure: COLONOSCOPY WITH PROPOFOL;  Surgeon: Irving Copas., MD;  Location: [place];  Service: Gastroenterology;  Laterality: N/A;   CORONARY ANGIOPLASTY WITH STENT PLACEMENT  1995, 2000, 2005   stents in RCA, LAD & Cx-OM. -Stents were patent by cardiac catheterization in November 2016   ENDOSCOPIC RETROGRADE CHOLANGIOPANCREATOGRAPHY  (ERCP) WITH PROPOFOL N/A 02/09/2018   Procedure: ENDOSCOPIC RETROGRADE CHOLANGIOPANCREATOGRAPHY (ERCP) WITH PROPOFOL;  Surgeon: Irving Copas., MD;  Location: [facility];  Service: Gastroenterology;  Laterality: N/A;   ENDOSCOPIC RETROGRADE CHOLANGIOPANCREATOGRAPHY (ERCP) WITH PROPOFOL N/A 03/30/2018   Procedure: ENDOSCOPIC RETROGRADE CHOLANGIOPANCREATOGRAPHY (ERCP) WITH PROPOFOL;  Surgeon: Rush Landmark Telford Nab., MD;  Location: [facility] ENDOSCOPY;  Service: Gastroenterology;  Laterality: N/A;   ERCP N/A 01/08/2018   Procedure: ENDOSCOPIC RETROGRADE CHOLANGIOPANCREATOGRAPHY (ERCP);  Surgeon: Jackquline Denmark, MD;  Location: [facility] ENDOSCOPY;  Service: Endoscopy;  Laterality: N/A;   ERCP N/A 04/01/2021   Procedure: ENDOSCOPIC RETROGRADE CHOLANGIOPANCREATOGRAPHY (ERCP);  Surgeon: Jackquline Denmark, MD;  Location: [facility] ENDOSCOPY;  Service: Endoscopy;  Laterality: N/A;  Please schedule after 1 PM   ESOPHAGOGASTRODUODENOSCOPY N/A 03/30/2018   Procedure: ESOPHAGOGASTRODUODENOSCOPY (EGD);  Surgeon: Irving Copas., MD;  Location: [facility];  Service: Gastroenterology;  Laterality: N/A;   ESOPHAGOGASTRODUODENOSCOPY (EGD) WITH PROPOFOL N/A 06/06/2018   Procedure: ESOPHAGOGASTRODUODENOSCOPY (EGD) WITH PROPOFOL;  Surgeon: Rush Landmark Telford Nab., MD;  Location: [place];  Service: Gastroenterology;  Laterality: N/A;  ESOPHAGOGASTRODUODENOSCOPY (EGD) WITH PROPOFOL N/A 07/06/2018   Procedure: ESOPHAGOGASTRODUODENOSCOPY (EGD) WITH PROPOFOL;  Surgeon: Rush Landmark Telford Nab., MD;  Location: [place];  Service: Gastroenterology;  Laterality: N/A;   lumbar spine stimulator     POLYPECTOMY  06/06/2018   Procedure: POLYPECTOMY;  Surgeon: Mansouraty, Telford Nab., MD;  Location: [place];  Service: Gastroenterology;;   REMOVAL OF STONES  03/30/2018   Procedure: REMOVAL OF STONES;  Surgeon: Irving Copas., MD;  Location: [facility];  Service: Gastroenterology;;   Joan Mayans  01/08/2018    Procedure: Joan Mayans;  Surgeon: Jackquline Denmark, MD;  Location: [place];  Service: Endoscopy;;   SPHINCTEROTOMY  02/09/2018   Procedure: Joan Mayans;  Surgeon: Mansouraty, Telford Nab., MD;  Location: [facility];  Service: Gastroenterology;;   STENT REMOVAL  02/09/2018   Procedure: STENT REMOVAL;  Surgeon: Irving Copas., MD;  Location: [facility] ENDOSCOPY;  Service: Gastroenterology;;   Lavell Islam REMOVAL  03/30/2018   Procedure: STENT REMOVAL;  Surgeon: Irving Copas., MD;  Location: [facility] ENDOSCOPY;  Service: Gastroenterology;;   TRANSTHORACIC ECHOCARDIOGRAM  04/2015   EF 40-45%, mod antsept, ant, antlat, apical HK, mild AI/MR, PASP 4mmHg.   UVULOPALATOPHARYNGOPLASTY, TONSILLECTOMY AND SEPTOPLASTY  2002    reports that he quit smoking about 50 years ago. His smoking use included cigarettes. He has never used smokeless tobacco. He reports that he does not currently use alcohol. He reports that he does not use drugs. family history includes Breast cancer (age of onset: 79) in his brother; Cancer in his maternal uncle; Cancer (age of onset: 19) in his sister; Diabetes in his mother; Heart disease in his mother; Hypertension in his maternal aunt. No Known Allergies    Outpatient Encounter Medications as of 04/16/2021  Medication Sig   acetaminophen (TYLENOL) 500 MG tablet Take 1,000 mg by mouth every 6 (six) hours as needed for moderate pain or headache.    Ascorbic Acid (VITAMIN C) 1000 MG tablet Take 1,000 mg by mouth daily.   Carboxymethylcellulose Sodium (LUBRICANT EYE DROPS OP) Place 2 drops into both eyes daily as needed (for dry eyes).   Cholecalciferol (VITAMIN D3) 50 MCG (2000 UT) TABS Take 2,000 Units by mouth daily.   gabapentin (NEURONTIN) 600 MG tablet Take 2 tablets (1,200 mg total) by mouth 3 (three) times daily.   ibuprofen (ADVIL) 200 MG tablet Take 400 mg by mouth every 6 (six) hours as needed for headache or moderate pain.   Magnesium 250 MG TABS Take 1 tablet  (250 mg total) by mouth at bedtime. (Patient taking differently: Take 250 mg by mouth at bedtime.)   metoprolol tartrate (LOPRESSOR) 25 MG tablet TAKE 12.5 MG ( 1/2 TABLET) TWICE A DAY . MAY TAKE AN ADDITIONAL 25 MG IF NEEDED FOR PALPS. (Patient taking differently: Take 12.5 mg by mouth 2 (two) times daily.)   Multiple Vitamin (MULTIVITAMIN WITH MINERALS) TABS tablet Take 1 tablet by mouth daily.   Omega-3 Fatty Acids (FISH OIL) 1000 MG CAPS Take 1,000 mg by mouth daily.    omeprazole (PRILOSEC) 40 MG capsule Take 1 capsule (40 mg total) by mouth daily.   rosuvastatin (CRESTOR) 40 MG tablet Take 1 tablet (40 mg total) by mouth daily.   sildenafil (VIAGRA) 100 MG tablet TAKE 1/2 TO 1 TABLET BY MOUTH DAILY AS NEEDED FOR ERECTILE DYSFUNCTION (Patient taking differently: Take 50-100 mg by mouth daily as needed for erectile dysfunction.)   vitamin B-12 (CYANOCOBALAMIN) 1000 MCG tablet Take 1,000 mcg by mouth daily.   vitamin E 400 UNIT  capsule Take 400 Units by mouth daily.   No facility-administered encounter medications on file as of 04/16/2021.     REVIEW OF SYSTEMS  : All other systems reviewed and negative except where noted in the History of Present Illness.   PHYSICAL EXAM: BP 100/60   Pulse (!) 55   Ht 5\' 11"  (1.803 m)   Wt 220 lb 6.4 oz (100 kg)   SpO2 95%   BMI 30.74 kg/m  General: Well developed white male in no acute distress Head: Normocephalic and atraumatic Eyes:  Sclerae anicteric, conjunctiva pink. Ears: Normal auditory acuity Lungs: Clear throughout to auscultation; no W/R/R. Heart:  Slightly bradycardic.  No murmurs noted. Abdomen: Soft, non-distended.  BS present.  Non-tender. Musculoskeletal: Symmetrical with no gross deformities  Skin: No lesions on visible extremities Extremities: Leg braces in place. Neurological: Alert oriented x 4, grossly non-focal Psychological:  Alert and cooperative. Normal mood and affect  ASSESSMENT AND PLAN: Acute, ascending  cholangitis.  Prior ERCP/sphinct, cholecystectomy, dilations x 2 of duodenal stricture.  04/01/2021 ERCP: J-shaped stomach.  Duodenal stenosis.  Choledocholithiasis, no attempt to remove, 10 [place], 7 mm plastic stent placed.  Completed course of abx.  No fever or abdominal pain.  Labs last week show normal LFTs.  CA 19-9, CEA levels both normal.   Atrophic left liver lobe, progressive since 2019, suspect ischemic.  ILD   Post polio syndrome.  **Plan is for repeat ERCP and possible EUS with spyglass with Dr. Rush Landmark, which is already scheduled in January.  In the interim he was instructed if he were to develop any jaundice, abdominal pain, fever then he would need to contact our office and proceed to the emergency department. **Needs to be taking his metoprolol as directed at 12.5 mg twice daily instead of 25 mg daily to see if that will help with his fatigue, low pulse, and blood pressure.   CC:  [name], MD

## 2021-04-16 NOTE — Patient Instructions (Signed)
If you are age 71 or older, your body mass index should be between 23-30. Your Body mass index is 30.74 kg/m. If this is out of the aforementioned range listed, please consider follow up with your Primary Care Provider.  If you are age 33 or younger, your body mass index should be between 19-25. Your Body mass index is 30.74 kg/m. If this is out of the aformentioned range listed, please consider follow up with your Primary Care Provider.   ________________________________________________________  The [place] [facility] providers would like to encourage you to use [facility] to communicate with providers for non-urgent requests or questions.  Due to long hold times on the telephone, sending your provider a message by [facility] may be a faster and more efficient way to get a response.  Please allow 48 business hours for a response.  Please remember that this is for non-urgent requests.  _______________________________________________________

## 2021-04-21 NOTE — Addendum Note (Signed)
Addended by: Horris Latino on: 04/21/2021 03:11 PM   Modules accepted: Orders

## 2021-04-22 ENCOUNTER — Other Ambulatory Visit: Payer: Self-pay

## 2021-04-23 ENCOUNTER — Encounter: Payer: Self-pay | Admitting: Family Medicine

## 2021-04-23 DIAGNOSIS — G14 Postpolio syndrome: Secondary | ICD-10-CM

## 2021-05-02 NOTE — Telephone Encounter (Signed)
Letter printed and in Ama box.

## 2021-05-02 NOTE — Telephone Encounter (Signed)
Mailed letter °

## 2021-05-18 ENCOUNTER — Other Ambulatory Visit: Payer: Self-pay | Admitting: Cardiology

## 2021-05-22 ENCOUNTER — Encounter (HOSPITAL_COMMUNITY): Payer: Self-pay | Admitting: Gastroenterology

## 2021-05-23 NOTE — Progress Notes (Signed)
Attempted to obtain medical history via telephone, unable to reach at this time. I left a voicemail to return pre surgical testing department's phone call.  

## 2021-06-03 ENCOUNTER — Other Ambulatory Visit: Payer: Self-pay

## 2021-06-03 ENCOUNTER — Encounter (HOSPITAL_COMMUNITY): Payer: Self-pay | Admitting: Gastroenterology

## 2021-06-03 NOTE — Progress Notes (Signed)
Mr. Luger denies chest pain or shortness of breath. Patient denies having any s/s of Covid in his household.  Patient denies any known exposure to Covid.  PCP is Dr [name] Cardiologist is Dr. Selena Batten.  I instructed patient Mr. Viviano  Dry off with a clean towel. Do not put lotion, powder, cologne or deodorant or makeup.No jewelry or piercings. Men may shave their face and neck. Woman should not shave. No nail polish, artificial or acrylic nails. Wear clean clothes, brush your teeth. Glasses, contact lens,dentures or partials may not be worn in the OR. If you need to wear them, please bring a case for glasses, do not wear contacts or bring a case, the hospital does not have contact cases, dentures or partials will have to be removed , make sure they are clean, we will provide a denture cup to put them in. You will need some one to drive you home and a responsible person over the age of 75 to stay with you for the first 24 hours after surgery.

## 2021-06-05 ENCOUNTER — Other Ambulatory Visit: Payer: Self-pay

## 2021-06-05 ENCOUNTER — Ambulatory Visit (HOSPITAL_COMMUNITY)
Admission: RE | Admit: 2021-06-05 | Discharge: 2021-06-05 | Disposition: A | Discharge status: Home / Self Care | Payer: Medicare Other | Attending: Gastroenterology | Admitting: Gastroenterology

## 2021-06-05 ENCOUNTER — Encounter (HOSPITAL_COMMUNITY)
Admission: RE | Disposition: A | Discharge status: Home / Self Care | Payer: Self-pay | Source: Home / Self Care | Attending: Gastroenterology

## 2021-06-05 ENCOUNTER — Encounter (HOSPITAL_COMMUNITY): Payer: Self-pay | Admitting: Gastroenterology

## 2021-06-05 ENCOUNTER — Ambulatory Visit (HOSPITAL_COMMUNITY): Payer: Medicare Other | Admitting: Anesthesiology

## 2021-06-05 ENCOUNTER — Ambulatory Visit (HOSPITAL_COMMUNITY): Payer: Medicare Other

## 2021-06-05 DIAGNOSIS — K449 Diaphragmatic hernia without obstruction or gangrene: Secondary | ICD-10-CM | POA: Diagnosis not present

## 2021-06-05 DIAGNOSIS — K2970 Gastritis, unspecified, without bleeding: Secondary | ICD-10-CM | POA: Diagnosis not present

## 2021-06-05 DIAGNOSIS — Z87891 Personal history of nicotine dependence: Secondary | ICD-10-CM | POA: Diagnosis not present

## 2021-06-05 DIAGNOSIS — K8031 Calculus of bile duct with cholangitis, unspecified, with obstruction: Secondary | ICD-10-CM | POA: Diagnosis not present

## 2021-06-05 DIAGNOSIS — R935 Abnormal findings on diagnostic imaging of other abdominal regions, including retroperitoneum: Secondary | ICD-10-CM | POA: Insufficient documentation

## 2021-06-05 DIAGNOSIS — Z4659 Encounter for fitting and adjustment of other gastrointestinal appliance and device: Secondary | ICD-10-CM

## 2021-06-05 DIAGNOSIS — K8051 Calculus of bile duct without cholangitis or cholecystitis with obstruction: Secondary | ICD-10-CM

## 2021-06-05 DIAGNOSIS — K8070 Calculus of gallbladder and bile duct without cholecystitis without obstruction: Secondary | ICD-10-CM | POA: Diagnosis not present

## 2021-06-05 DIAGNOSIS — T85590A Other mechanical complication of bile duct prosthesis, initial encounter: Secondary | ICD-10-CM | POA: Diagnosis not present

## 2021-06-05 DIAGNOSIS — K315 Obstruction of duodenum: Secondary | ICD-10-CM | POA: Insufficient documentation

## 2021-06-05 DIAGNOSIS — K831 Obstruction of bile duct: Secondary | ICD-10-CM

## 2021-06-05 DIAGNOSIS — K838 Other specified diseases of biliary tract: Secondary | ICD-10-CM | POA: Diagnosis not present

## 2021-06-05 DIAGNOSIS — K8020 Calculus of gallbladder without cholecystitis without obstruction: Secondary | ICD-10-CM

## 2021-06-05 DIAGNOSIS — Z419 Encounter for procedure for purposes other than remedying health state, unspecified: Secondary | ICD-10-CM

## 2021-06-05 DIAGNOSIS — K2289 Other specified disease of esophagus: Secondary | ICD-10-CM | POA: Diagnosis not present

## 2021-06-05 DIAGNOSIS — K8050 Calculus of bile duct without cholangitis or cholecystitis without obstruction: Secondary | ICD-10-CM | POA: Diagnosis not present

## 2021-06-05 DIAGNOSIS — K8080 Other cholelithiasis without obstruction: Secondary | ICD-10-CM

## 2021-06-05 HISTORY — PX: BILIARY BRUSHING: SHX6843

## 2021-06-05 HISTORY — PX: REMOVAL OF STONES: SHX5545

## 2021-06-05 HISTORY — PX: STENT REMOVAL: SHX6421

## 2021-06-05 HISTORY — PX: BILIARY DILATION: SHX6850

## 2021-06-05 HISTORY — PX: ENDOSCOPIC RETROGRADE CHOLANGIOPANCREATOGRAPHY (ERCP) WITH PROPOFOL: SHX5810

## 2021-06-05 HISTORY — PX: BIOPSY: SHX5522

## 2021-06-05 HISTORY — PX: SPYGLASS CHOLANGIOSCOPY: SHX5441

## 2021-06-05 SURGERY — ENDOSCOPIC RETROGRADE CHOLANGIOPANCREATOGRAPHY (ERCP) WITH PROPOFOL
Anesthesia: General

## 2021-06-05 MED ORDER — PHENYLEPHRINE HCL-NACL 20-0.9 MG/250ML-% IV SOLN
INTRAVENOUS | Status: DC | PRN
  Administered 2021-06-05: 40 ug/min via INTRAVENOUS

## 2021-06-05 MED ORDER — DEXAMETHASONE SODIUM PHOSPHATE 10 MG/ML IJ SOLN
INTRAMUSCULAR | Status: DC | PRN
  Administered 2021-06-05: 5 mg via INTRAVENOUS

## 2021-06-05 MED ORDER — IOPAMIDOL (ISOVUE-300) INJECTION 61%
INTRAVENOUS | Status: DC | PRN
  Administered 2021-06-05: 25 mL

## 2021-06-05 MED ORDER — ROCURONIUM BROMIDE 10 MG/ML (PF) SYRINGE
PREFILLED_SYRINGE | INTRAVENOUS | Status: DC | PRN
  Administered 2021-06-05: 50 mg via INTRAVENOUS

## 2021-06-05 MED ORDER — GLUCAGON HCL RDNA (DIAGNOSTIC) 1 MG IJ SOLR
INTRAMUSCULAR | Status: AC
  Filled 2021-06-05: qty 1

## 2021-06-05 MED ORDER — OMEPRAZOLE 40 MG PO CPDR: 40.0000 mg | DELAYED_RELEASE_CAPSULE | Freq: Two times a day (BID) | ORAL | 6 refills | Status: DC

## 2021-06-05 MED ORDER — PHENYLEPHRINE 40 MCG/ML (10ML) SYRINGE FOR IV PUSH (FOR BLOOD PRESSURE SUPPORT)
PREFILLED_SYRINGE | INTRAVENOUS | Status: DC | PRN
  Administered 2021-06-05 (×2): 80 ug via INTRAVENOUS

## 2021-06-05 MED ORDER — LACTATED RINGERS IV SOLN
INTRAVENOUS | Status: AC | PRN
  Administered 2021-06-05: 20 mL/h via INTRAVENOUS

## 2021-06-05 MED ORDER — PROPOFOL 10 MG/ML IV BOLUS
INTRAVENOUS | Status: DC | PRN
Start: 2021-06-05 — End: 2021-06-05
  Administered 2021-06-05: 180 mg via INTRAVENOUS

## 2021-06-05 MED ORDER — INDOMETHACIN 50 MG RE SUPP
RECTAL | Status: AC
  Filled 2021-06-05: qty 2

## 2021-06-05 MED ORDER — CIPROFLOXACIN IN D5W 400 MG/200ML IV SOLN
INTRAVENOUS | Status: DC | PRN
  Administered 2021-06-05: 400 mg via INTRAVENOUS

## 2021-06-05 MED ORDER — LIDOCAINE 2% (20 MG/ML) 5 ML SYRINGE
INTRAMUSCULAR | Status: DC | PRN
  Administered 2021-06-05: 60 mg via INTRAVENOUS

## 2021-06-05 MED ORDER — OXYCODONE HCL 5 MG/5ML PO SOLN: 5.0000 mg | Freq: Once | ORAL | Status: DC | PRN

## 2021-06-05 MED ORDER — FENTANYL CITRATE (PF) 250 MCG/5ML IJ SOLN
INTRAMUSCULAR | Status: DC | PRN
  Administered 2021-06-05: 100 ug via INTRAVENOUS

## 2021-06-05 MED ORDER — ONDANSETRON HCL 4 MG/2ML IJ SOLN: 4.0000 mg | Freq: Four times a day (QID) | INTRAMUSCULAR | Status: DC | PRN

## 2021-06-05 MED ORDER — OXYCODONE HCL 5 MG PO TABS: 5.0000 mg | ORAL_TABLET | Freq: Once | ORAL | Status: DC | PRN

## 2021-06-05 MED ORDER — FENTANYL CITRATE (PF) 100 MCG/2ML IJ SOLN: 25.0000 ug | INTRAMUSCULAR | Status: DC | PRN

## 2021-06-05 MED ORDER — CIPROFLOXACIN IN D5W 400 MG/200ML IV SOLN
INTRAVENOUS | Status: AC
  Filled 2021-06-05: qty 200

## 2021-06-05 MED ORDER — SODIUM CHLORIDE 0.9 % IV SOLN: INTRAVENOUS | Status: DC

## 2021-06-05 MED ORDER — INDOMETHACIN 50 MG RE SUPP
RECTAL | Status: DC | PRN
  Administered 2021-06-05: 100 mg via RECTAL

## 2021-06-05 MED ORDER — GLUCAGON HCL RDNA (DIAGNOSTIC) 1 MG IJ SOLR
INTRAMUSCULAR | Status: DC | PRN
Start: 2021-06-05 — End: 2021-06-05
  Administered 2021-06-05 (×3): .25 mg via INTRAVENOUS

## 2021-06-05 MED ORDER — ONDANSETRON HCL 4 MG/2ML IJ SOLN
INTRAMUSCULAR | Status: DC | PRN
  Administered 2021-06-05: 4 mg via INTRAVENOUS

## 2021-06-05 MED ORDER — SUGAMMADEX SODIUM 200 MG/2ML IV SOLN
INTRAVENOUS | Status: DC | PRN
  Administered 2021-06-05 (×2): 100 mg via INTRAVENOUS

## 2021-06-05 NOTE — Op Note (Signed)
[facility] Patient Name: Alexander [name] Procedure Date : 06/05/2021 MRN: [id number] Attending MD: Justice Britain , MD Date of Birth: Oct 15, 1950 CSN: [id number] Age: 71 Admit Type: Outpatient Procedure:                ERCP Indications:              Bile duct stone(s), Abnormal abdominal CT,                            Follow-up of ascending cholangitis, Biliary stent                            removal Providers:                Justice Britain, MD, Glori Bickers, RN, Cletis Athens, Technician Referring MD:             [name], Jackquline Denmark, MD Medicines:                General Anesthesia, Cipro 400 mg IV, Indomethacin                            100 mg PR, Glucagon 5.99 mg IV Complications:            No immediate complications. Estimated Blood Loss:     Estimated blood loss was minimal. Procedure:                Pre-Anesthesia Assessment:                           - Prior to the procedure, a History and Physical                            was performed, and patient medications and                            allergies were reviewed. [name] tolerance of                            previous anesthesia was also reviewed. The risks                            and benefits of the procedure and the sedation                            options and risks were discussed with the patient.                            All questions were answered, and informed consent                            was obtained. Prior Anticoagulants: The patient has  taken no previous anticoagulant or antiplatelet                            agents. ASA Grade Assessment: III - A patient with                            severe systemic disease. After reviewing the risks                            and benefits, the patient was deemed in                            satisfactory condition to undergo the procedure.                           After  obtaining informed consent, the scope was                            passed under direct vision. Throughout the                            procedure, the patient's blood pressure, pulse, and                            oxygen saturations were monitored continuously. The                            [id number]-H190 ([id number]) Olympus endoscope was introduced                            through the mouth, and used to inject contrast into                            and used to locate the major papilla. The TJF-Q190V                            ([id number]) Olympus duodenoscope was introduced                            through the mouth, and used to inject contrast into                            and used to inject contrast into the bile duct. The                            ERCP was accomplished without difficulty. The                            patient tolerated the procedure. Scope In: Scope Out: Findings:      A scout film of the abdomen was obtained. Surgical clips, consistent       with previous cholecystectomy, were seen in the area of the right upper       quadrant of the abdomen.  One stent ending in the main bile duct was seen.      A standard esophagogastroduodenoscopy scope was used for the examination       of the upper gastrointestinal tract. The scope was passed under direct       vision through the upper GI tract. No gross lesions were noted in the       entire esophagus. The Z-line was irregular and was found 37 cm from the       incisors. A 3 cm hiatal hernia was present. Patchy moderate inflammation       characterized by erosions, erythema and granularity was found in the       entire examined stomach - biopsied for HP evaluation. A significant       J-shaped deformity was found of the stomach. An acquired       benign-appearing, intrinsic mild stenosis was found proximal to the       major papilla and was traversed into the ampullary region (it did not       allow for true seating in the  duodenum however. An acquired       benign-appearing, intrinsic moderate stenosis was found just distal to       the major papilla and was traversed with the endoscope but not the       duodenoscope. A biliary sphincterotomy had been performed. The       sphincterotomy appeared open. One plastic biliary stent originating in       the biliary tree was barely emerging from the major papilla. The stent       was partially occluded with tissue. This stent was removed from the       biliary tree using a rat-toothed forceps and snare and sent for cytology.      A short 0.035 inch Soft Jagwire was passed into the biliary tree. The       Hydratome sphincterotome was passed over the guidewire and the bile duct       was then deeply cannulated. Contrast was injected. I personally       interpreted the bile duct images. Ductal flow of contrast was adequate.       Image quality was adequate. Contrast extended to the hepatic ducts.       Opacification of the entire biliary tree except for the gallbladder was       successful. The main bile duct contained multiple filling defect thought       to be stones and sludge. The main bile duct was severely dilated. The       largest diameter was 18 mm. The biliary pancreatic junction contained a       single very mild narrowing less than 10 mm in length. The biliary tree       was swept with a retrieval balloon. Sludge was swept from the duct.       Multiple stones were removed. No stones remained. I was able to pass an       15-18 mm balloon without issue through the entire duct. An occlusion       cholangiogram was performed that showed no further significant biliary       pathology. Dilation of the distal CBD with an 01-07-09 mm balloon (to a       maximum balloon size of 10 mm) dilator was successful as a       sphincteroplasty for a total of 4 minutes. Cells for cytology  were       obtained by brushing in the biliary pancreatic junction using 2 brushes.       To get a better look at the distal CBD, the bile duct was explored       endoscopically using the SpyGlass direct visualization system. The       SpyGlass probe was advanced to the bifurcation. Visibility with the       probe was good. The main bile duct was severely dilated. The largest       diameter was 18 mm. No evidence of further sludge stones were present.       Minimal amount of abnormal mucosa characterized by inflammation was       found in the very distal third of the main bile duct - this was biopsied       with a SpyBite miniature biopsy forceps for histology.      A pancreatogram was not performed.      The duodenoscope was withdrawn from the patient. Impression:               - No gross lesions in esophagus. Z-line irregular,                            37 cm from the incisors.                           - 3 cm hiatal hernia.                           - Gastritis - biopsied for HP evaluation.                           - Significant J-shaped deformity requiring left                            lateral positioning to allow scope passage.                           - Acquired duodenal stenosis proximal and distal to                            the ampulla. These could be traversed with                            therapeutic Endoscope but not with the Duodenoscope                            (distal narrowing).                           - Prior biliary sphincterotomy appeared open.                           - One partially occluded stent from the biliary                            tree was seen in the major papilla - removed and  sent for cytology.                           - Filling defects consistent with sludge stones                            were seen on the cholangiogram with significant                            biliary dilation..                           - A very minimal biliary narrowing was noted                            distally.  Sphincteroplasty performed. Cytology                            brushings performed.                           - Choledocholithiasis (formed sludge stones rather                            than gallstones) were found including sludge.                            Complete removal was accomplished by sweeping with                            a 15-18 mm balloon.                           - Spyscope performed and no evidence of any                            retained stones/debris. Very minimal abnormal                            biliary tract mucosa was found distally - biopsied.                           - Suspect that the angulation of the distal bile                            duct due to the stenosis proximal to the ampulla is                            a reason that may allow the patient to develop                            recurrent sludge/stones. Recommendation:           - The patient will be observed post-procedure,  until all discharge criteria are met.                           - Discharge patient to home.                           - Patient has a contact number available for                            emergencies. The signs and symptoms of potential                            delayed complications were discussed with the                            patient. Return to normal activities tomorrow.                            Written discharge instructions were provided to the                            patient.                           - Low fat diet.                           - Check liver enzymes (AST, ALT, alkaline                            phosphatase, bilirubin) in 2 weeks.                           - Observe patient's clinical course.                           - Await cytology results and await path results.                           - Increase PPI to BID dosing for now.                           - Watch for pancreatitis, bleeding, perforation,                             and cholangitis.                           - Consider repeat EGD with therapeutic dilation                            based on patient's symptoms in the next few weeks.                           - Will plan repeat cross-sectional imaging to  evaluate the distal bile duct.                           - Consideration of EUS in the future to evaluate                            the ampulla further pending repeat imaging.                           - If recurrent sludge/stones form, then will need                            to consider Ursodiol in future.                           - The findings and recommendations were discussed                            with the patient.                           - The findings and recommendations were discussed                            with the patient's family. Procedure Code(s):        --- Professional ---                           [phone], Endoscopic retrograde                            cholangiopancreatography (ERCP); with removal of                            foreign body(s) or stent(s) from biliary/pancreatic                            duct(s)                           43264, Endoscopic retrograde                            cholangiopancreatography (ERCP); with removal of                            calculi/debris from biliary/pancreatic duct(s)                           43273, Endoscopic cannulation of papilla with                            direct visualization of pancreatic/common bile                            duct(s) (List separately in addition to code(s) for  primary procedure) Diagnosis Code(s):        --- Professional ---                           K80.31, Calculus of bile duct with cholangitis,                            unspecified, with obstruction                           K22.8, Other specified diseases of esophagus                           K44.9, Diaphragmatic  hernia without obstruction or                            gangrene                           K29.70, Gastritis, unspecified, without bleeding                           K31.89, Other diseases of stomach and duodenum                           K31.5, Obstruction of duodenum                           T85.590A, Other mechanical complication of bile                            duct prosthesis, initial encounter                           K83.9, Disease of biliary tract, unspecified                           Z46.59, Encounter for fitting and adjustment of                            other gastrointestinal appliance and device                           K83.09, Other cholangitis                           K83.8, Other specified diseases of biliary tract                           R93.5, Abnormal findings on diagnostic imaging of                            other abdominal regions, including retroperitoneum                           R93.2, Abnormal findings on diagnostic imaging of  liver and biliary tract CPT copyright 2019 [employer]. All rights reserved. The codes documented in this report are preliminary and upon coder review may  be revised to meet current compliance requirements. Justice Britain, MD 06/05/2021 10:00:44 AM Number of Addenda: 0

## 2021-06-05 NOTE — Anesthesia Procedure Notes (Signed)
Procedure Name: Intubation Date/Time: 06/05/2021 7:42 AM Performed by: Imagene Riches, CRNA Pre-anesthesia Checklist: Patient identified, Emergency Drugs available, Suction available and Patient being monitored Patient Re-evaluated:Patient Re-evaluated prior to induction Oxygen Delivery Method: Circle System Utilized Preoxygenation: Pre-oxygenation with 100% oxygen Induction Type: IV induction Ventilation: Mask ventilation without difficulty and Oral airway inserted - appropriate to patient size Laryngoscope Size: Sabra Heck and 2 Grade View: Grade I Tube type: Oral Tube size: 7.5 mm Number of attempts: 1 Airway Equipment and Method: Stylet and Oral airway Placement Confirmation: ETT inserted through vocal cords under direct vision, positive ETCO2 and breath sounds checked- equal and bilateral Secured at: 22 cm Tube secured with: Tape Dental Injury: Teeth and Oropharynx as per pre-operative assessment

## 2021-06-05 NOTE — H&P (Signed)
GASTROENTEROLOGY PROCEDURE H&P NOTE   Primary Care Physician: [name], MD  HPI: Alexander [name] is a 71 y.o. male who presents for ERCP and possible dilation of the duodenal stricture.  Past Medical History:  Diagnosis Date   Acute cholecystitis 03/2016   with sepsis   Arthritis    CAD S/P percutaneous coronary angioplasty 1995, 2000, 2005   a. 1995 s/p BMS;  b. 2000 s/p BMS;  c. 2005 s/p stent - All stents in [facility] (RCA, LAD & OM - unknown on which date);  d. 04/2015 NSTEMI/Cath: LM nl, ost/p LAD 20%, patent mLAD stent, RI small, OM2 patent stent, pRCA 20%, patent stent, EF 45-50%-->Med Rx.   Calculus of bile duct with acute cholangitis with obstruction    Chronic pain syndrome    a. Followed @ [facility];  b. Uses 12-14 excedrins per day.   Complication of anesthesia 04/01/2021   agiated- "battling nurses"   Duodenal stricture    Essential hypertension    Fatty liver    Gallstones    GERD (gastroesophageal reflux disease)    History of depression    History of post poliomyelitis muscular atrophy    HLD (hyperlipidemia)    Ischemic cardiomyopathy    a. 04/2015 Echo: EF 40-45%, mod antsept, ant, antlat, apical HK, mild AI/MR, PASP 105mmHg.   Migraines    Neuropathy    due to post polio syndrome   OSA (obstructive sleep apnea)    does not want to use CPAP, using mouth guard   PAF (paroxysmal atrial fibrillation) ([place])    a. 04/2015 in setting of cholecystits and sepsis -->Amio;  b. CHA2DS2VASc = 4--> Eliquis.   Pneumonia 04/2015   Post-polio syndrome    a. ambulates with braces.   Septic shock ([place]) 01/07/2018   Shoulder pain, right    Past Surgical History:  Procedure Laterality Date   BALLOON DILATION N/A 06/06/2018   Procedure: BALLOON DILATION;  Surgeon: Rush Landmark Telford Nab., MD;  Location: [place];  Service: Gastroenterology;  Laterality: N/A;   BALLOON DILATION N/A 07/06/2018   Procedure: BALLOON DILATION;  Surgeon: Rush Landmark Telford Nab., MD;  Location: [place];  Service: Gastroenterology;  Laterality: N/A;   BILIARY STENT PLACEMENT  01/08/2018   Procedure: BILIARY STENT PLACEMENT;  Surgeon: Jackquline Denmark, MD;  Location: [facility] ENDOSCOPY;  Service: Endoscopy;;   BILIARY STENT PLACEMENT N/A 02/09/2018   Procedure: BILIARY STENT PLACEMENT;  Surgeon: Irving Copas., MD;  Location: [facility] ENDOSCOPY;  Service: Gastroenterology;  Laterality: N/A;   BILIARY STENT PLACEMENT  04/01/2021   Procedure: BILIARY STENT PLACEMENT;  Surgeon: Jackquline Denmark, MD;  Location: [facility] ENDOSCOPY;  Service: Endoscopy;;   BIOPSY  06/06/2018   Procedure: BIOPSY;  Surgeon: Irving Copas., MD;  Location: [facility] ENDOSCOPY;  Service: Gastroenterology;;   BIOPSY  04/01/2021   Procedure: BIOPSY;  Surgeon: Jackquline Denmark, MD;  Location: [facility] ENDOSCOPY;  Service: Endoscopy;;   CARDIAC CATHETERIZATION N/A 04/30/2015   Procedure: Left Heart Cath and Coronary Angiography;  Surgeon: Troy Sine, MD;  Location: [place] CV LAB;  Service: Cardiovascular; LM nl, ost/p LAD 20%, patent mLAD stent, RI small, OM2 patent stent, pRCA 20%, patent stent, EF 45-50%-->Med Rx    cervical spine stimulator  08/2012   CHOLECYSTECTOMY  06/10/2015   Procedure: LAPAROSCOPIC CHOLECYSTECTOMY;  Surgeon: Mickeal Skinner, MD;  Location: [place];  Service: General;;   COLONOSCOPY  09/2013   TA x2, rpt 5 yrs ([place])   COLONOSCOPY WITH  ESOPHAGOGASTRODUODENOSCOPY (EGD)  06/2018   reactive gastropathy, tubular adenoma, rpt colonoscopy 5 yrs (Mans[place])   COLONOSCOPY WITH PROPOFOL N/A 06/06/2018   Procedure: COLONOSCOPY WITH PROPOFOL;  Surgeon: Rush Landmark Telford Nab., MD;  Location: [place];  Service: Gastroenterology;  Laterality: N/A;   CORONARY ANGIOPLASTY WITH STENT PLACEMENT  1995, 2000, 2005   stents in RCA, LAD & Cx-OM. -Stents were patent by cardiac catheterization in November 2016   ENDOSCOPIC RETROGRADE CHOLANGIOPANCREATOGRAPHY (ERCP) WITH PROPOFOL N/A 02/09/2018    Procedure: ENDOSCOPIC RETROGRADE CHOLANGIOPANCREATOGRAPHY (ERCP) WITH PROPOFOL;  Surgeon: Irving Copas., MD;  Location: [facility] ENDOSCOPY;  Service: Gastroenterology;  Laterality: N/A;   ENDOSCOPIC RETROGRADE CHOLANGIOPANCREATOGRAPHY (ERCP) WITH PROPOFOL N/A 03/30/2018   Procedure: ENDOSCOPIC RETROGRADE CHOLANGIOPANCREATOGRAPHY (ERCP) WITH PROPOFOL;  Surgeon: Rush Landmark Telford Nab., MD;  Location: [facility] ENDOSCOPY;  Service: Gastroenterology;  Laterality: N/A;   ERCP N/A 01/08/2018   Procedure: ENDOSCOPIC RETROGRADE CHOLANGIOPANCREATOGRAPHY (ERCP);  Surgeon: Jackquline Denmark, MD;  Location: [facility] ENDOSCOPY;  Service: Endoscopy;  Laterality: N/A;   ERCP N/A 04/01/2021   Procedure: ENDOSCOPIC RETROGRADE CHOLANGIOPANCREATOGRAPHY (ERCP);  Surgeon: Jackquline Denmark, MD;  Location: [facility] ENDOSCOPY;  Service: Endoscopy;  Laterality: N/A;  Please schedule after 1 PM   ESOPHAGOGASTRODUODENOSCOPY N/A 03/30/2018   Procedure: ESOPHAGOGASTRODUODENOSCOPY (EGD);  Surgeon: Irving Copas., MD;  Location: [facility] ENDOSCOPY;  Service: Gastroenterology;  Laterality: N/A;   ESOPHAGOGASTRODUODENOSCOPY (EGD) WITH PROPOFOL N/A 06/06/2018   Procedure: ESOPHAGOGASTRODUODENOSCOPY (EGD) WITH PROPOFOL;  Surgeon: Rush Landmark Telford Nab., MD;  Location: [place];  Service: Gastroenterology;  Laterality: N/A;   ESOPHAGOGASTRODUODENOSCOPY (EGD) WITH PROPOFOL N/A 07/06/2018   Procedure: ESOPHAGOGASTRODUODENOSCOPY (EGD) WITH PROPOFOL;  Surgeon: Rush Landmark Telford Nab., MD;  Location: [place];  Service: Gastroenterology;  Laterality: N/A;   lumbar spine stimulator     POLYPECTOMY  06/06/2018   Procedure: POLYPECTOMY;  Surgeon: Mansouraty, Telford Nab., MD;  Location: [place];  Service: Gastroenterology;;   REMOVAL OF STONES  03/30/2018   Procedure: REMOVAL OF STONES;  Surgeon: Irving Copas., MD;  Location: [facility] ENDOSCOPY;  Service: Gastroenterology;;   Joan Mayans  01/08/2018   Procedure: Joan Mayans;  Surgeon: Jackquline Denmark, MD;  Location: [place];  Service: Endoscopy;;   SPHINCTEROTOMY  02/09/2018   Procedure: Joan Mayans;  Surgeon: Mansouraty, Telford Nab., MD;  Location: [facility] ENDOSCOPY;  Service: Gastroenterology;;   STENT REMOVAL  02/09/2018   Procedure: STENT REMOVAL;  Surgeon: Irving Copas., MD;  Location: [facility] ENDOSCOPY;  Service: Gastroenterology;;   Lavell Islam REMOVAL  03/30/2018   Procedure: STENT REMOVAL;  Surgeon: Irving Copas., MD;  Location: [facility] ENDOSCOPY;  Service: Gastroenterology;;   TRANSTHORACIC ECHOCARDIOGRAM  04/2015   EF 40-45%, mod antsept, ant, antlat, apical HK, mild AI/MR, PASP 70mmHg.   UVULOPALATOPHARYNGOPLASTY, TONSILLECTOMY AND SEPTOPLASTY  2002   Current Facility-Administered Medications  Medication Dose Route Frequency Provider Last Rate Last Admin   0.9 %  sodium chloride infusion   Intravenous Continuous Mansouraty, Telford Nab., MD       lactated ringers infusion    Continuous PRN Mansouraty, Telford Nab., MD 20 mL/hr at 06/05/21 0726 20 mL/hr at 06/05/21 0726    Current Facility-Administered Medications:    0.9 %  sodium chloride infusion, , Intravenous, Continuous, Mansouraty, Telford Nab., MD   lactated ringers infusion, , , Continuous PRN, Mansouraty, Telford Nab., MD, Last Rate: 20 mL/hr at 06/05/21 0726, 20 mL/hr at 06/05/21 0726 No Known Allergies Family History  Problem Relation Age of Onset   Diabetes Mother    Heart disease Mother    Cancer Sister 94  male cancer   Breast cancer Brother 59   Hypertension Maternal Aunt    Cancer Maternal Uncle        spinal   Coronary artery disease Neg Hx    Stroke Neg Hx    Colon cancer Neg Hx    Pancreatic cancer Neg Hx    Stomach cancer Neg Hx    Esophageal cancer Neg Hx    Inflammatory bowel disease Neg Hx    Liver disease Neg Hx    Rectal cancer Neg Hx    Social History   Socioeconomic History   Marital status: Married    Spouse name: Not on file   Number of children: 3   Years of  education: Not on file   Highest education level: Not on file  Occupational History   Occupation: retired  Tobacco Use   Smoking status: Former    Types: Cigarettes    Quit date: 1972    Years since quitting: 51.0   Smokeless tobacco: Never   Tobacco comments:    was very light  Vaping Use   Vaping Use: Never used  Substance and Sexual Activity   Alcohol use: Not Currently    Comment: ocassional -maybe 2 beers   Drug use: No   Sexual activity: Yes  Other Topics Concern   Not on file  Social History Narrative   Caffeine: 1 cup soda, occasional coffee   Lives with wife [name]) and 50 yo son, 4 dogs   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   Activity: no regular activity   Diet: overall healthy, good fruits and vegetables, good amt water   Social Determinants of Health   Financial Resource Strain: Not on file  Food Insecurity: Not on file  Transportation Needs: Not on file  Physical Activity: Not on file  Stress: Not on file  Social Connections: Not on file  Intimate Partner Violence: Not on file    Physical Exam: Today's Vitals   06/05/21 0722 06/05/21 0725  BP: (!) 142/84   Pulse: (!) 53   Resp: 18   Temp: 97.9 F (36.6 C)   TempSrc: Temporal   SpO2: 97%   Weight:  93 kg  Height:  5\' 10"  (1.778 m)  PainSc: 0-No pain    Body mass index is 29.41 kg/m. GEN: NAD EYE: Sclerae anicteric ENT: MMM CV: Non-tachycardic GI: Soft, NT/ND NEURO:  Alert & Oriented x 3  Lab Results: No results for input(s): WBC, HGB, HCT, PLT in the last 72 hours. BMET No results for input(s): NA, K, CL, CO2, GLUCOSE, BUN, CREATININE, CALCIUM in the last 72 hours. LFT No results for input(s): PROT, ALBUMIN, AST, ALT, ALKPHOS, BILITOT, BILIDIR, IBILI in the last 72 hours. PT/INR No results for input(s): LABPROT, INR in the last 72 hours.   Impression / Plan: This is a 70 y.o.male who presents for ERCP and possible dilation of  the duodenal stricture.  The risks of an ERCP were discussed at length, including but not limited to the risk of perforation, bleeding, abdominal pain, post-ERCP pancreatitis (while usually mild can be severe and even life threatening).   The risks and benefits of endoscopic evaluation/treatment were discussed with the patient and/or family; these include but are not limited to the risk of perforation, infection, bleeding, missed lesions, lack of diagnosis, severe illness requiring hospitalization, as well as anesthesia and sedation related illnesses.  [name] history has been reviewed, patient examined,  no change in status, and deemed stable for procedure.  The patient and/or family is agreeable to proceed.    Justice Britain, MD [name] Office # [phone]

## 2021-06-05 NOTE — Transfer of Care (Signed)
Immediate Anesthesia Transfer of Care Note  Patient: Alexander [name]  Procedure(s) Performed: ENDOSCOPIC RETROGRADE CHOLANGIOPANCREATOGRAPHY (ERCP) WITH PROPOFOL SPYGLASS CHOLANGIOSCOPY BIOPSY STENT REMOVAL REMOVAL OF STONES BILIARY DILATION BILIARY BRUSHING  Patient Location: PACU  Anesthesia Type:General  Level of Consciousness: awake, alert  and oriented  Airway & Oxygen Therapy: Patient Spontanous Breathing and Patient connected to nasal cannula oxygen  Post-op Assessment: Report given to RN and Post -op Vital signs reviewed and stable  Post vital signs: Reviewed and stable  Last Vitals:  Vitals Value Taken Time  BP 126/77 06/05/21 0943  Temp 36.5 C 06/05/21 0942  Pulse 75 06/05/21 0946  Resp 11 06/05/21 0946  SpO2 92 % 06/05/21 0946  Vitals shown include unvalidated device data.  Last Pain:  Vitals:   06/05/21 0722  TempSrc: Temporal  PainSc: 0-No pain         Complications: No notable events documented.

## 2021-06-05 NOTE — Anesthesia Preprocedure Evaluation (Signed)
Anesthesia Evaluation  Patient identified by MRN, date of birth, ID band Patient awake    Reviewed: Allergy & Precautions, H&P , NPO status , Patient's Chart, lab work & pertinent test results  Airway Mallampati: II   Neck ROM: full    Dental   Pulmonary former smoker,    breath sounds clear to auscultation       Cardiovascular hypertension, + CAD, + Past MI and + Cardiac Stents  + dysrhythmias Atrial Fibrillation  Rhythm:regular Rate:Normal  TTE (07/2020): EF 60%, normal valve structure and function.   Neuro/Psych  Headaches,    GI/Hepatic GERD  ,  Endo/Other    Renal/GU      Musculoskeletal  (+) Arthritis ,   Abdominal   Peds  Hematology   Anesthesia Other Findings   Reproductive/Obstetrics                             Anesthesia Physical Anesthesia Plan  ASA: 3  Anesthesia Plan: General   Post-op Pain Management:    Induction: Intravenous  PONV Risk Score and Plan: 2 and Ondansetron, Dexamethasone and Treatment may vary due to age or medical condition  Airway Management Planned: Oral ETT  Additional Equipment:   Intra-op Plan:   Post-operative Plan: Extubation in OR  Informed Consent: I have reviewed the patients History and Physical, chart, labs and discussed the procedure including the risks, benefits and alternatives for the proposed anesthesia with the patient or authorized representative who has indicated his/her understanding and acceptance.     Dental advisory given  Plan Discussed with: CRNA, Anesthesiologist and Surgeon  Anesthesia Plan Comments:         Anesthesia Quick Evaluation

## 2021-06-06 ENCOUNTER — Encounter: Payer: Self-pay | Admitting: Gastroenterology

## 2021-06-06 LAB — CYTOLOGY - NON PAP

## 2021-06-06 LAB — SURGICAL PATHOLOGY

## 2021-06-06 NOTE — Anesthesia Postprocedure Evaluation (Signed)
Anesthesia Post Note  Patient: Alexander [name]  Procedure(s) Performed: ENDOSCOPIC RETROGRADE CHOLANGIOPANCREATOGRAPHY (ERCP) WITH PROPOFOL SPYGLASS CHOLANGIOSCOPY BIOPSY STENT REMOVAL REMOVAL OF STONES BILIARY DILATION BILIARY BRUSHING     Patient location during evaluation: Endoscopy Anesthesia Type: General Level of consciousness: awake and alert Pain management: pain level controlled Vital Signs Assessment: post-procedure vital signs reviewed and stable Respiratory status: spontaneous breathing, nonlabored ventilation, respiratory function stable and patient connected to nasal cannula oxygen Cardiovascular status: blood pressure returned to baseline and stable Postop Assessment: no apparent nausea or vomiting Anesthetic complications: no   No notable events documented.  Last Vitals:  Vitals:   06/05/21 1000 06/05/21 1014  BP: 116/73 116/78  Pulse: 71 72  Resp: 15 17  Temp:    SpO2: 92% 92%    Last Pain:  Vitals:   06/05/21 1014  TempSrc:   PainSc: 0-No pain                 Kashaun Bebo S

## 2021-06-09 ENCOUNTER — Encounter (HOSPITAL_COMMUNITY): Payer: Self-pay | Admitting: Gastroenterology

## 2021-06-09 ENCOUNTER — Other Ambulatory Visit: Payer: Self-pay

## 2021-06-09 DIAGNOSIS — K8080 Other cholelithiasis without obstruction: Secondary | ICD-10-CM

## 2021-06-09 DIAGNOSIS — K831 Obstruction of bile duct: Secondary | ICD-10-CM

## 2021-06-16 ENCOUNTER — Other Ambulatory Visit (INDEPENDENT_AMBULATORY_CARE_PROVIDER_SITE_OTHER): Payer: Medicare Other

## 2021-06-16 DIAGNOSIS — K831 Obstruction of bile duct: Secondary | ICD-10-CM | POA: Diagnosis not present

## 2021-06-16 DIAGNOSIS — K8080 Other cholelithiasis without obstruction: Secondary | ICD-10-CM | POA: Diagnosis not present

## 2021-06-16 LAB — PROTIME-INR
INR: 1.1 ratio — ABNORMAL HIGH (ref 0.8–1.0)
Prothrombin Time: 11.7 s (ref 9.6–13.1)

## 2021-06-16 LAB — COMPREHENSIVE METABOLIC PANEL
ALT: 13 U/L (ref 0–53)
AST: 13 U/L (ref 0–37)
Albumin: 4.2 g/dL (ref 3.5–5.2)
Alkaline Phosphatase: 51 U/L (ref 39–117)
BUN: 14 mg/dL (ref 6–23)
CO2: 26 mEq/L (ref 19–32)
Calcium: 9.1 mg/dL (ref 8.4–10.5)
Chloride: 105 mEq/L (ref 96–112)
Creatinine, Ser: 0.52 mg/dL (ref 0.40–1.50)
GFR: 101.57 mL/min (ref >60.00)
Glucose, Bld: 142 mg/dL — ABNORMAL HIGH (ref 70–99)
Potassium: 3.7 mEq/L (ref 3.5–5.1)
Sodium: 139 mEq/L (ref 135–145)
Total Bilirubin: 0.6 mg/dL (ref 0.2–1.2)
Total Protein: 7.4 g/dL (ref 6.0–8.3)

## 2021-06-18 ENCOUNTER — Other Ambulatory Visit: Payer: Self-pay

## 2021-06-18 DIAGNOSIS — K8080 Other cholelithiasis without obstruction: Secondary | ICD-10-CM

## 2021-06-18 DIAGNOSIS — K831 Obstruction of bile duct: Secondary | ICD-10-CM

## 2021-06-20 ENCOUNTER — Other Ambulatory Visit: Payer: Self-pay

## 2021-06-20 ENCOUNTER — Ambulatory Visit (HOSPITAL_COMMUNITY)
Admission: RE | Admit: 2021-06-20 | Discharge: 2021-06-20 | Disposition: A | Discharge status: Home / Self Care | Payer: Medicare Other | Source: Ambulatory Visit | Attending: Gastroenterology | Admitting: Gastroenterology

## 2021-06-20 DIAGNOSIS — K760 Fatty (change of) liver, not elsewhere classified: Secondary | ICD-10-CM | POA: Diagnosis not present

## 2021-06-20 DIAGNOSIS — I700 Atherosclerosis of aorta: Secondary | ICD-10-CM | POA: Diagnosis not present

## 2021-06-20 DIAGNOSIS — K8080 Other cholelithiasis without obstruction: Secondary | ICD-10-CM | POA: Diagnosis not present

## 2021-06-20 DIAGNOSIS — A419 Sepsis, unspecified organism: Secondary | ICD-10-CM | POA: Diagnosis not present

## 2021-06-20 DIAGNOSIS — K831 Obstruction of bile duct: Secondary | ICD-10-CM | POA: Insufficient documentation

## 2021-06-20 DIAGNOSIS — K5730 Diverticulosis of large intestine without perforation or abscess without bleeding: Secondary | ICD-10-CM | POA: Diagnosis not present

## 2021-06-20 MED ORDER — IOHEXOL 350 MG/ML SOLN
100.0000 mL | Freq: Once | INTRAVENOUS | Status: AC | PRN
  Administered 2021-06-20: 100 mL via INTRAVENOUS

## 2021-07-16 ENCOUNTER — Encounter (HOSPITAL_COMMUNITY): Payer: Self-pay | Admitting: Gastroenterology

## 2021-07-16 NOTE — Progress Notes (Signed)
Attempted to obtain medical history via telephone, unable to reach at this time. I left a voicemail to return pre surgical testing department's phone call.  

## 2021-07-17 ENCOUNTER — Encounter: Payer: Self-pay | Admitting: Gastroenterology

## 2021-07-17 ENCOUNTER — Ambulatory Visit (INDEPENDENT_AMBULATORY_CARE_PROVIDER_SITE_OTHER): Payer: Medicare Other | Admitting: Gastroenterology

## 2021-07-17 VITALS — BP 136/78 | HR 77 | Ht 71.0 in | Wt 219.0 lb

## 2021-07-17 DIAGNOSIS — R194 Change in bowel habit: Secondary | ICD-10-CM

## 2021-07-17 DIAGNOSIS — K8050 Calculus of bile duct without cholangitis or cholecystitis without obstruction: Secondary | ICD-10-CM

## 2021-07-17 DIAGNOSIS — K831 Obstruction of bile duct: Secondary | ICD-10-CM

## 2021-07-17 DIAGNOSIS — K315 Obstruction of duodenum: Secondary | ICD-10-CM

## 2021-07-17 DIAGNOSIS — Z8719 Personal history of other diseases of the digestive system: Secondary | ICD-10-CM

## 2021-07-17 DIAGNOSIS — R140 Abdominal distension (gaseous): Secondary | ICD-10-CM | POA: Diagnosis not present

## 2021-07-17 NOTE — H&P (View-Only) (Signed)
[place] VISIT   Primary Care Provider [name], MD [place] [address] [phone]  Patient Profile: Alexander [name] is a 71 y.o. male with a pmh significant for CAD, HTN, pAFib, s/p CCK, hx of recurrent choledocholithiasis (status post ERCP with sphincterotomy and balloon sweeping), biliary narrowing (atypical cells on brushings), colon polyps (tubular adenoma), duodenal stricture (status post dilation).  The patient presents to the [facility] for an evaluation and management of problem(s) noted below:  Problem List 1. Biliary stricture   2. Choledocholithiasis   3. Duodenal stenosis   4. Bloating   5. Change in bowel habits      History of Present Illness: Please see prior notes for full details of HPI.  Interval History I had not seen the patient in almost 2 years.  In November of last year he presented with significant abdominal pain and abnormal LFTs and underwent ERCP with finding of choledocholithiasis and sludge.  Stent was placed by one of my partners.  Subsequently he did well and was able to be discharged.  In January I performed his follow-up ERCP and cleaned out his bile duct.  Did a spyglass and did not see any evidence of persistent choledocholithiasis or biliary sludge.  I did do brushings due to a slight narrowing in the distal duct.  He also had a history of previous atypical cells which were monitored and never came of anything.  Patient was found on the repeat brushings this last ERCP in January to have atypical cells again.  We performed a cross-sectional imaging study as noted below which did not show any evidence of an ampullary or bile duct or pancreas lesion.  He is here for follow-up.  Next week, he is scheduled for an EUS so that we can further evaluate the previously identified atypical cells and ensure nothing else is being missed.  The patient overall has been feeling much  better after his last ERCP.  He still having some issues of bloating and we are going to consider proceeding with duodenal stricture dilation.  He has had some changes in his bowel habits as well where he will have days of looser bowel movements (not overt diarrhea but more motion is) and other times where he will have normal bowel movements.  Bloating symptom is his biggest issue at this time however.  He denies any dark urine or jaundice or pruritus symptoms.  GI Review of Systems Positive as above Negative for odynophagia, dysphagia, nausea, vomiting, pain, melena, hematochezia   Review of Systems General: Denies fevers/chills/unintentional weight loss Cardiovascular: Denies chest pain Pulmonary: No significant change in shortness of breath Gastroenterological: See HPI Genitourinary: Denies darkened urine Dermatological: Denies jaundice Psychological: Mood is stable   Medications Current Outpatient Medications  Medication Sig Dispense Refill   acetaminophen (TYLENOL) 500 MG tablet Take 1,000 mg by mouth every 6 (six) hours as needed for moderate pain or headache.      Ascorbic Acid (VITAMIN C) 1000 MG tablet Take 1,000 mg by mouth daily.     Carboxymethylcellulose Sodium (LUBRICANT EYE DROPS OP) Place 2 drops into both eyes daily as needed (for dry eyes).     Cholecalciferol (VITAMIN D3) 50 MCG (2000 UT) TABS Take 2,000 Units by mouth daily.     gabapentin (NEURONTIN) 600 MG tablet Take 2 tablets (1,200 mg total) by mouth 3 (three) times daily. 540 tablet 3   ibuprofen (ADVIL) 200 MG tablet Take 400 mg by  mouth every 6 (six) hours as needed for headache or moderate pain.     Magnesium 250 MG TABS Take 1 tablet (250 mg total) by mouth at bedtime. (Patient taking differently: Take 250 mg by mouth at bedtime.)  0   metoprolol tartrate (LOPRESSOR) 25 MG tablet TAKE ONE-HALF TABLET BY  MOUTH TWICE DAILY , MAY  TAKE AN ADDITIONAL 1 TABLET IF NEEDED FOR PALPITATIONS 180 tablet 1   Multiple  Vitamin (MULTIVITAMIN WITH MINERALS) TABS tablet Take 1 tablet by mouth daily.     Omega-3 Fatty Acids (FISH OIL) 1000 MG CAPS Take 1,000 mg by mouth daily.      omeprazole (PRILOSEC) 40 MG capsule Take 1 capsule (40 mg total) by mouth 2 (two) times daily before a meal. 60 capsule 6   rosuvastatin (CRESTOR) 40 MG tablet Take 1 tablet (40 mg total) by mouth daily. 90 tablet 3   sildenafil (VIAGRA) 100 MG tablet TAKE 1/2 TO 1 TABLET BY MOUTH DAILY AS NEEDED FOR ERECTILE DYSFUNCTION (Patient taking differently: Take 50-100 mg by mouth daily as needed for erectile dysfunction.) 6 tablet 0   vitamin B-12 (CYANOCOBALAMIN) 1000 MCG tablet Take 1,000 mcg by mouth daily.     vitamin E 400 UNIT capsule Take 400 Units by mouth daily.     No current facility-administered medications for this visit.    Allergies No Known Allergies  Histories Past Medical History:  Diagnosis Date   Acute cholecystitis 03/2016   with sepsis   Arthritis    CAD S/P percutaneous coronary angioplasty 1995, 2000, 2005   a. 1995 s/p BMS;  b. 2000 s/p BMS;  c. 2005 s/p stent - All stents in [facility] (RCA, LAD & OM - unknown on which date);  d. 04/2015 NSTEMI/Cath: LM nl, ost/p LAD 20%, patent mLAD stent, RI small, OM2 patent stent, pRCA 20%, patent stent, EF 45-50%-->Med Rx.   Calculus of bile duct with acute cholangitis with obstruction    Chronic pain syndrome    a. Followed @ [facility];  b. Uses 12-14 excedrins per day.   Complication of anesthesia 04/01/2021   agiated- "battling nurses"   Duodenal stricture    Essential hypertension    Fatty liver    Gallstones    GERD (gastroesophageal reflux disease)    History of depression    History of post poliomyelitis muscular atrophy    HLD (hyperlipidemia)    Ischemic cardiomyopathy    a. 04/2015 Echo: EF 40-45%, mod antsept, ant, antlat, apical HK, mild AI/MR, PASP 36mmHg.   Migraines    Neuropathy    due to post polio syndrome   OSA (obstructive sleep apnea)     does not want to use CPAP, using mouth guard   PAF (paroxysmal atrial fibrillation) ([place])    a. 04/2015 in setting of cholecystits and sepsis -->Amio;  b. CHA2DS2VASc = 4--> Eliquis.   Pneumonia 04/2015   Post-polio syndrome    a. ambulates with braces.   Septic shock ([place]) 01/07/2018   Shoulder pain, right    Past Surgical History:  Procedure Laterality Date   BALLOON DILATION N/A 06/06/2018   Procedure: BALLOON DILATION;  Surgeon: Rush Landmark Telford Nab., MD;  Location: [place];  Service: Gastroenterology;  Laterality: N/A;   BALLOON DILATION N/A 07/06/2018   Procedure: BALLOON DILATION;  Surgeon: Rush Landmark Telford Nab., MD;  Location: [place];  Service: Gastroenterology;  Laterality: N/A;   BILIARY BRUSHING  06/05/2021   Procedure: BILIARY BRUSHING;  Surgeon: Justice Britain  Brooke Bonito., MD;  Location: [place];  Service: Gastroenterology;;   BILIARY DILATION  06/05/2021   Procedure: BILIARY DILATION;  Surgeon: Irving Copas., MD;  Location: [place];  Service: Gastroenterology;;   BILIARY STENT PLACEMENT  01/08/2018   Procedure: BILIARY STENT PLACEMENT;  Surgeon: Jackquline Denmark, MD;  Location: [place];  Service: Endoscopy;;   BILIARY STENT PLACEMENT N/A 02/09/2018   Procedure: BILIARY STENT PLACEMENT;  Surgeon: Irving Copas., MD;  Location: [facility];  Service: Gastroenterology;  Laterality: N/A;   BILIARY STENT PLACEMENT  04/01/2021   Procedure: BILIARY STENT PLACEMENT;  Surgeon: Jackquline Denmark, MD;  Location: [facility] ENDOSCOPY;  Service: Endoscopy;;   BIOPSY  06/06/2018   Procedure: BIOPSY;  Surgeon: Irving Copas., MD;  Location: [facility] ENDOSCOPY;  Service: Gastroenterology;;   BIOPSY  04/01/2021   Procedure: BIOPSY;  Surgeon: Jackquline Denmark, MD;  Location: [facility] ENDOSCOPY;  Service: Endoscopy;;   BIOPSY  06/05/2021   Procedure: BIOPSY;  Surgeon: Irving Copas., MD;  Location: [facility] ENDOSCOPY;  Service: Gastroenterology;;   CARDIAC CATHETERIZATION  N/A 04/30/2015   Procedure: Left Heart Cath and Coronary Angiography;  Surgeon: Troy Sine, MD;  Location: [place] CV LAB;  Service: Cardiovascular; LM nl, ost/p LAD 20%, patent mLAD stent, RI small, OM2 patent stent, pRCA 20%, patent stent, EF 45-50%-->Med Rx    cervical spine stimulator  08/2012   CHOLECYSTECTOMY  06/10/2015   Procedure: LAPAROSCOPIC CHOLECYSTECTOMY;  Surgeon: Arta Bruce Kinsinger, MD;  Location: [place];  Service: General;;   COLONOSCOPY  09/2013   TA x2, rpt 5 yrs ([place])   COLONOSCOPY WITH ESOPHAGOGASTRODUODENOSCOPY (EGD)  06/2018   reactive gastropathy, tubular adenoma, rpt colonoscopy 5 yrs ([place])   COLONOSCOPY WITH PROPOFOL N/A 06/06/2018   Procedure: COLONOSCOPY WITH PROPOFOL;  Surgeon: Irving Copas., MD;  Location: [place];  Service: Gastroenterology;  Laterality: N/A;   CORONARY ANGIOPLASTY WITH STENT PLACEMENT  1995, 2000, 2005   stents in RCA, LAD & Cx-OM. -Stents were patent by cardiac catheterization in November 2016   ENDOSCOPIC RETROGRADE CHOLANGIOPANCREATOGRAPHY (ERCP) WITH PROPOFOL N/A 02/09/2018   Procedure: ENDOSCOPIC RETROGRADE CHOLANGIOPANCREATOGRAPHY (ERCP) WITH PROPOFOL;  Surgeon: Irving Copas., MD;  Location: [facility];  Service: Gastroenterology;  Laterality: N/A;   ENDOSCOPIC RETROGRADE CHOLANGIOPANCREATOGRAPHY (ERCP) WITH PROPOFOL N/A 03/30/2018   Procedure: ENDOSCOPIC RETROGRADE CHOLANGIOPANCREATOGRAPHY (ERCP) WITH PROPOFOL;  Surgeon: Rush Landmark Telford Nab., MD;  Location: [facility] ENDOSCOPY;  Service: Gastroenterology;  Laterality: N/A;   ENDOSCOPIC RETROGRADE CHOLANGIOPANCREATOGRAPHY (ERCP) WITH PROPOFOL N/A 06/05/2021   Procedure: ENDOSCOPIC RETROGRADE CHOLANGIOPANCREATOGRAPHY (ERCP) WITH PROPOFOL;  Surgeon: Rush Landmark Telford Nab., MD;  Location: [place];  Service: Gastroenterology;  Laterality: N/A;   ERCP N/A 01/08/2018   Procedure: ENDOSCOPIC RETROGRADE CHOLANGIOPANCREATOGRAPHY (ERCP);  Surgeon: Jackquline Denmark,  MD;  Location: [facility] ENDOSCOPY;  Service: Endoscopy;  Laterality: N/A;   ERCP N/A 04/01/2021   Procedure: ENDOSCOPIC RETROGRADE CHOLANGIOPANCREATOGRAPHY (ERCP);  Surgeon: Jackquline Denmark, MD;  Location: [facility] ENDOSCOPY;  Service: Endoscopy;  Laterality: N/A;  Please schedule after 1 PM   ESOPHAGOGASTRODUODENOSCOPY N/A 03/30/2018   Procedure: ESOPHAGOGASTRODUODENOSCOPY (EGD);  Surgeon: Irving Copas., MD;  Location: [facility];  Service: Gastroenterology;  Laterality: N/A;   ESOPHAGOGASTRODUODENOSCOPY (EGD) WITH PROPOFOL N/A 06/06/2018   Procedure: ESOPHAGOGASTRODUODENOSCOPY (EGD) WITH PROPOFOL;  Surgeon: Rush Landmark Telford Nab., MD;  Location: [place];  Service: Gastroenterology;  Laterality: N/A;   ESOPHAGOGASTRODUODENOSCOPY (EGD) WITH PROPOFOL N/A 07/06/2018   Procedure: ESOPHAGOGASTRODUODENOSCOPY (EGD) WITH PROPOFOL;  Surgeon: Rush Landmark Telford Nab., MD;  Location: [place];  Service: Gastroenterology;  Laterality: N/A;   lumbar spine stimulator     POLYPECTOMY  06/06/2018   Procedure: POLYPECTOMY;  Surgeon: Mansouraty, Telford Nab., MD;  Location: [place];  Service: Gastroenterology;;   REMOVAL OF STONES  03/30/2018   Procedure: REMOVAL OF STONES;  Surgeon: Irving Copas., MD;  Location: [facility];  Service: Gastroenterology;;   REMOVAL OF STONES  06/05/2021   Procedure: REMOVAL OF STONES;  Surgeon: Irving Copas., MD;  Location: [place];  Service: Gastroenterology;;   Joan Mayans  01/08/2018   Procedure: Joan Mayans;  Surgeon: Jackquline Denmark, MD;  Location: [place];  Service: Endoscopy;;   SPHINCTEROTOMY  02/09/2018   Procedure: Joan Mayans;  Surgeon: Mansouraty, Telford Nab., MD;  Location: [facility];  Service: Gastroenterology;;   [facility] CHOLANGIOSCOPY N/A 06/05/2021   Procedure: [facility] CHOLANGIOSCOPY;  Surgeon: Irving Copas., MD;  Location: [place];  Service: Gastroenterology;  Laterality: N/A;   STENT REMOVAL  02/09/2018    Procedure: STENT REMOVAL;  Surgeon: Irving Copas., MD;  Location: [facility];  Service: Gastroenterology;;   Lavell Islam REMOVAL  03/30/2018   Procedure: STENT REMOVAL;  Surgeon: Irving Copas., MD;  Location: [facility];  Service: Gastroenterology;;   Lavell Islam REMOVAL  06/05/2021   Procedure: STENT REMOVAL;  Surgeon: Irving Copas., MD;  Location: [place];  Service: Gastroenterology;;   TRANSTHORACIC ECHOCARDIOGRAM  04/2015   EF 40-45%, mod antsept, ant, antlat, apical HK, mild AI/MR, PASP 31mmHg.   UVULOPALATOPHARYNGOPLASTY, TONSILLECTOMY AND SEPTOPLASTY  2002   Social History   Socioeconomic History   Marital status: Married    Spouse name: Not on file   Number of children: 3   Years of education: Not on file   Highest education level: Not on file  Occupational History   Occupation: retired  Tobacco Use   Smoking status: Former    Types: Cigarettes    Quit date: 1972    Years since quitting: 51.1   Smokeless tobacco: Never   Tobacco comments:    was very light  Vaping Use   Vaping Use: Never used  Substance and Sexual Activity   Alcohol use: Not Currently    Comment: ocassional -maybe 2 beers   Drug use: No   Sexual activity: Yes  Other Topics Concern   Not on file  Social History Narrative   Caffeine: 1 cup soda, occasional coffee   Lives with wife [name]) and 9 yo son, 4 dogs   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   Activity: no regular activity   Diet: overall healthy, good fruits and vegetables, good amt water   Social Determinants of Radio broadcast assistant Strain: Not on file  Food Insecurity: Not on file  Transportation Needs: Not on file  Physical Activity: Not on file  Stress: Not on file  Social Connections: Not on file  Intimate Partner Violence: Not on file   Family History  Problem Relation Age of Onset   Diabetes Mother    Heart disease Mother    Cancer  Sister 66       male cancer   Breast cancer Brother 60   Hypertension Maternal Aunt    Cancer Maternal Uncle        spinal   Coronary artery disease Neg Hx    Stroke Neg Hx    Colon cancer Neg Hx    Pancreatic cancer Neg Hx    Stomach cancer Neg Hx    Esophageal cancer  Neg Hx    Inflammatory bowel disease Neg Hx    Liver disease Neg Hx    Rectal cancer Neg Hx    I have reviewed his medical, social, and family history in detail and updated the electronic medical record as necessary.    PHYSICAL EXAMINATION  BP 136/78    Pulse 77    Ht 5\' 11"  (1.803 m)    Wt 219 lb (99.3 kg)    BMI 30.54 kg/m  Wt Readings from Last 3 Encounters:  07/17/21 219 lb (99.3 kg)  06/05/21 205 lb (93 kg)  04/16/21 220 lb 6.4 oz (100 kg)  GEN: NAD, appears stated age, not accompanied by wife today PSYCH: Cooperative, without pressured speech EYE: Conjunctivae pink, sclerae anicteric ENT: MMM CV: Nontachycardic RESP: No audible wheezing GI: NABS, soft, NT/ND, surgical scars appreciated, without rebound MSK/EXT: No significant lower extremity edema SKIN: No jaundice NEURO:  Alert & Oriented x 3, no focal deficits   REVIEW OF DATA  I reviewed the following data at the time of this encounter:  GI Procedures and Studies  January 2023 ERCP - No gross lesions in esophagus. Z-line irregular, 37 cm from the incisors. - 3 cm hiatal hernia. - Gastritis - biopsied for HP evaluation. - Significant J-shaped deformity requiring left lateral positioning to allow scope passage. - Acquired duodenal stenosis proximal and distal to the ampulla. These could be traversed with therapeutic Endoscope but not with the Duodenoscope (distal narrowing). - Prior biliary sphincterotomy appeared open. - One partially occluded stent from the biliary tree was seen in the major papilla - removed and sent for cytology. - Filling defects consistent with sludge stones were seen on the cholangiogram with significant biliary  dilation.. - A very minimal biliary narrowing was noted distally.  Sphincteroplasty performed.  Cytology brushings performed - Choledocholithiasis (formed sludge stones rather than gallstones) were found including sludge. Complete removal was accomplished by sweeping with a 15-18 mm balloon. - Spyscope performed and no evidence of any retained stones/debris. Very minimal abnormal biliary tract mucosa was found distally - biopsied. - Suspect that the angulation of the distal bile duct due to the stenosis proximal to the ampulla is a reason that may allow the patient to develop recurrent sludge/stones.  Pathology FINAL MICROSCOPIC DIAGNOSIS:  A. COMMON BILE DUCT, BILIARY STENT  - No malignant cells identified  B. DISTAL BILE DUCT, BRUSHING  - Atypical cells present  FINAL MICROSCOPIC DIAGNOSIS:  A. STOMACH, BIOPSY:  - Gastric antral and oxyntic mucosa with no specific histopathologic  changes  - Warthin Starry stain is negative for Helicobacter pylori  B. BILE DUCT, DISTAL, BIOPSY:  - Scant fragment of biliary mucosa  - Negative for dysplasia or malignancy   Laboratory Studies  Reviewed in epic  Imaging Studies  No new imaging to review   ASSESSMENT  Mr. Trefz is a 71 y.o. male with a pmh significant for CAD, HTN, pAFib, s/p CCK, hx of recurrent choledocholithiasis (status post ERCP with sphincterotomy and balloon sweeping), biliary narrowing (atypical cells on brushings), colon polyps (tubular adenoma), duodenal stricture (status post dilation).  The patient is seen today for evaluation and management of:  1. Biliary stricture   2. Choledocholithiasis   3. Duodenal stenosis   4. Bloating   5. Change in bowel habits    The patient is hemodynamically and clinically stable.  He has done well since his last ERCP.  His duodenal stricture will need to be dilated that is our plan at our  upcoming procedure.  Due to the atypical cells that were found even though biliary tract spyglass  biopsies were negative, I am going to proceed with an EUS to evaluate the bile duct and pancreas.  Thankfully imaging has not shown any evidence of any mass or lesion.  I suspect this is a result of his previous inflammatory process.  I did discuss with the patient that he is at risk unfortunately due to the size of his biliary duct for recurrent sludge stones but hopefully this will not be the case.  He will let us know if other issues develop in the future.  Hopefully we can dilate his duodenal stricture with just 1 attempt but he may require multiple dilations that he did previously.  That tremendously helped his previous bloating issues.  We did discuss potential use of cholestyramine in the future for his alteration of bowel habits and consideration of small intestine bacterial overgrowth work-up/evaluation.  He would like to wait on further work-up and management of that until after his upcoming procedure.  The risks of an EUS including intestinal perforation, bleeding, infection, aspiration, and medication effects were discussed as was the possibility it may not give a definitive diagnosis if a biopsy is performed.  When a biopsy of the pancreas is done as part of the EUS, there is an additional risk of pancreatitis at the rate of about 1-2%.  It was explained that procedure related pancreatitis is typically mild, although it can be severe and even life threatening, which is why we do not perform random pancreatic biopsies and only biopsy a lesion/area we feel is concerning enough to warrant the risk. The risks and benefits of endoscopic evaluation were discussed with the patient; these include but are not limited to the risk of perforation, infection, bleeding, missed lesions, lack of diagnosis, severe illness requiring hospitalization, as well as anesthesia and sedation related illnesses.  The patient and/or family is agreeable to proceed.  All patient questions were answered to the best of my ability, and  the patient agrees to the aforementioned plan of action with follow-up as indicated.   PLAN  Continue PPI twice daily Proceed with scheduled EUS/EGD with duodenal dilation and fluoroscopy available next week Consider SIBO evaluation in future Consider EPI evaluation in future Consider cholestyramine therapy in future Colonoscopy in 2025 for surveillance   No orders of the defined types were placed in this encounter.   New Prescriptions   No medications on file   Modified Medications   No medications on file    Planned Follow Up: No follow-ups on file.   Total Time in Face-to-Face and in Coordination of Care for patient including independent/personal interpretation/review of prior testing, medical history, examination, medication adjustment, communicating results with the patient directly, and documentation within the EHR is 25 minutes.   Justice Britain, MD [facility] Office # [phone]

## 2021-07-17 NOTE — Progress Notes (Signed)
[place] VISIT   Primary Care Provider [name], MD [place] [address] [phone]  Patient Profile: Alexander [name] is a 71 y.o. male with a pmh significant for CAD, HTN, pAFib, s/p CCK, hx of recurrent choledocholithiasis (status post ERCP with sphincterotomy and balloon sweeping), biliary narrowing (atypical cells on brushings), colon polyps (tubular adenoma), duodenal stricture (status post dilation).  The patient presents to the [facility] for an evaluation and management of problem(s) noted below:  Problem List 1. Biliary stricture   2. Choledocholithiasis   3. Duodenal stenosis   4. Bloating   5. Change in bowel habits      History of Present Illness: Please see prior notes for full details of HPI.  Interval History I had not seen the patient in almost 2 years.  In November of last year he presented with significant abdominal pain and abnormal LFTs and underwent ERCP with finding of choledocholithiasis and sludge.  Stent was placed by one of my partners.  Subsequently he did well and was able to be discharged.  In January I performed his follow-up ERCP and cleaned out his bile duct.  Did a spyglass and did not see any evidence of persistent choledocholithiasis or biliary sludge.  I did do brushings due to a slight narrowing in the distal duct.  He also had a history of previous atypical cells which were monitored and never came of anything.  Patient was found on the repeat brushings this last ERCP in January to have atypical cells again.  We performed a cross-sectional imaging study as noted below which did not show any evidence of an ampullary or bile duct or pancreas lesion.  He is here for follow-up.  Next week, he is scheduled for an EUS so that we can further evaluate the previously identified atypical cells and ensure nothing else is being missed.  The patient overall has been feeling much  better after his last ERCP.  He still having some issues of bloating and we are going to consider proceeding with duodenal stricture dilation.  He has had some changes in his bowel habits as well where he will have days of looser bowel movements (not overt diarrhea but more motion is) and other times where he will have normal bowel movements.  Bloating symptom is his biggest issue at this time however.  He denies any dark urine or jaundice or pruritus symptoms.  GI Review of Systems Positive as above Negative for odynophagia, dysphagia, nausea, vomiting, pain, melena, hematochezia   Review of Systems General: Denies fevers/chills/unintentional weight loss Cardiovascular: Denies chest pain Pulmonary: No significant change in shortness of breath Gastroenterological: See HPI Genitourinary: Denies darkened urine Dermatological: Denies jaundice Psychological: Mood is stable   Medications Current Outpatient Medications  Medication Sig Dispense Refill   acetaminophen (TYLENOL) 500 MG tablet Take 1,000 mg by mouth every 6 (six) hours as needed for moderate pain or headache.      Ascorbic Acid (VITAMIN C) 1000 MG tablet Take 1,000 mg by mouth daily.     Carboxymethylcellulose Sodium (LUBRICANT EYE DROPS OP) Place 2 drops into both eyes daily as needed (for dry eyes).     Cholecalciferol (VITAMIN D3) 50 MCG (2000 UT) TABS Take 2,000 Units by mouth daily.     gabapentin (NEURONTIN) 600 MG tablet Take 2 tablets (1,200 mg total) by mouth 3 (three) times daily. 540 tablet 3   ibuprofen (ADVIL) 200 MG tablet Take 400 mg by  mouth every 6 (six) hours as needed for headache or moderate pain.     Magnesium 250 MG TABS Take 1 tablet (250 mg total) by mouth at bedtime. (Patient taking differently: Take 250 mg by mouth at bedtime.)  0   metoprolol tartrate (LOPRESSOR) 25 MG tablet TAKE ONE-HALF TABLET BY  MOUTH TWICE DAILY , MAY  TAKE AN ADDITIONAL 1 TABLET IF NEEDED FOR PALPITATIONS 180 tablet 1   Multiple  Vitamin (MULTIVITAMIN WITH MINERALS) TABS tablet Take 1 tablet by mouth daily.     Omega-3 Fatty Acids (FISH OIL) 1000 MG CAPS Take 1,000 mg by mouth daily.      omeprazole (PRILOSEC) 40 MG capsule Take 1 capsule (40 mg total) by mouth 2 (two) times daily before a meal. 60 capsule 6   rosuvastatin (CRESTOR) 40 MG tablet Take 1 tablet (40 mg total) by mouth daily. 90 tablet 3   sildenafil (VIAGRA) 100 MG tablet TAKE 1/2 TO 1 TABLET BY MOUTH DAILY AS NEEDED FOR ERECTILE DYSFUNCTION (Patient taking differently: Take 50-100 mg by mouth daily as needed for erectile dysfunction.) 6 tablet 0   vitamin B-12 (CYANOCOBALAMIN) 1000 MCG tablet Take 1,000 mcg by mouth daily.     vitamin E 400 UNIT capsule Take 400 Units by mouth daily.     No current facility-administered medications for this visit.    Allergies No Known Allergies  Histories Past Medical History:  Diagnosis Date   Acute cholecystitis 03/2016   with sepsis   Arthritis    CAD S/P percutaneous coronary angioplasty 1995, 2000, 2005   a. 1995 s/p BMS;  b. 2000 s/p BMS;  c. 2005 s/p stent - All stents in [facility] (RCA, LAD & OM - unknown on which date);  d. 04/2015 NSTEMI/Cath: LM nl, ost/p LAD 20%, patent mLAD stent, RI small, OM2 patent stent, pRCA 20%, patent stent, EF 45-50%-->Med Rx.   Calculus of bile duct with acute cholangitis with obstruction    Chronic pain syndrome    a. Followed @ [facility];  b. Uses 12-14 excedrins per day.   Complication of anesthesia 04/01/2021   agiated- "battling nurses"   Duodenal stricture    Essential hypertension    Fatty liver    Gallstones    GERD (gastroesophageal reflux disease)    History of depression    History of post poliomyelitis muscular atrophy    HLD (hyperlipidemia)    Ischemic cardiomyopathy    a. 04/2015 Echo: EF 40-45%, mod antsept, ant, antlat, apical HK, mild AI/MR, PASP 6mmHg.   Migraines    Neuropathy    due to post polio syndrome   OSA (obstructive sleep apnea)     does not want to use CPAP, using mouth guard   PAF (paroxysmal atrial fibrillation) ([place])    a. 04/2015 in setting of cholecystits and sepsis -->Amio;  b. CHA2DS2VASc = 4--> Eliquis.   Pneumonia 04/2015   Post-polio syndrome    a. ambulates with braces.   Septic shock ([place]) 01/07/2018   Shoulder pain, right    Past Surgical History:  Procedure Laterality Date   BALLOON DILATION N/A 06/06/2018   Procedure: BALLOON DILATION;  Surgeon: Rush Landmark Telford Nab., MD;  Location: [place];  Service: Gastroenterology;  Laterality: N/A;   BALLOON DILATION N/A 07/06/2018   Procedure: BALLOON DILATION;  Surgeon: Rush Landmark Telford Nab., MD;  Location: [place];  Service: Gastroenterology;  Laterality: N/A;   BILIARY BRUSHING  06/05/2021   Procedure: BILIARY BRUSHING;  Surgeon: Justice Britain  Brooke Bonito., MD;  Location: [place];  Service: Gastroenterology;;   BILIARY DILATION  06/05/2021   Procedure: BILIARY DILATION;  Surgeon: Irving Copas., MD;  Location: [place];  Service: Gastroenterology;;   BILIARY STENT PLACEMENT  01/08/2018   Procedure: BILIARY STENT PLACEMENT;  Surgeon: Jackquline Denmark, MD;  Location: [place];  Service: Endoscopy;;   BILIARY STENT PLACEMENT N/A 02/09/2018   Procedure: BILIARY STENT PLACEMENT;  Surgeon: Irving Copas., MD;  Location: [facility];  Service: Gastroenterology;  Laterality: N/A;   BILIARY STENT PLACEMENT  04/01/2021   Procedure: BILIARY STENT PLACEMENT;  Surgeon: Jackquline Denmark, MD;  Location: [facility] ENDOSCOPY;  Service: Endoscopy;;   BIOPSY  06/06/2018   Procedure: BIOPSY;  Surgeon: Irving Copas., MD;  Location: [facility] ENDOSCOPY;  Service: Gastroenterology;;   BIOPSY  04/01/2021   Procedure: BIOPSY;  Surgeon: Jackquline Denmark, MD;  Location: [facility] ENDOSCOPY;  Service: Endoscopy;;   BIOPSY  06/05/2021   Procedure: BIOPSY;  Surgeon: Irving Copas., MD;  Location: [facility] ENDOSCOPY;  Service: Gastroenterology;;   CARDIAC CATHETERIZATION  N/A 04/30/2015   Procedure: Left Heart Cath and Coronary Angiography;  Surgeon: Troy Sine, MD;  Location: [place] [facility];  Service: Cardiovascular; LM nl, ost/p LAD 20%, patent mLAD stent, RI small, OM2 patent stent, pRCA 20%, patent stent, EF 45-50%-->Med Rx    cervical spine stimulator  08/2012   CHOLECYSTECTOMY  06/10/2015   Procedure: LAPAROSCOPIC CHOLECYSTECTOMY;  Surgeon: Arta Bruce Kinsinger, MD;  Location: [place];  Service: General;;   COLONOSCOPY  09/2013   TA x2, rpt 5 yrs ([place])   COLONOSCOPY WITH ESOPHAGOGASTRODUODENOSCOPY (EGD)  06/2018   reactive gastropathy, tubular adenoma, rpt colonoscopy 5 yrs ([place])   COLONOSCOPY WITH PROPOFOL N/A 06/06/2018   Procedure: COLONOSCOPY WITH PROPOFOL;  Surgeon: Irving Copas., MD;  Location: [place];  Service: Gastroenterology;  Laterality: N/A;   CORONARY ANGIOPLASTY WITH STENT PLACEMENT  1995, 2000, 2005   stents in RCA, LAD & Cx-OM. -Stents were patent by cardiac catheterization in November 2016   ENDOSCOPIC RETROGRADE CHOLANGIOPANCREATOGRAPHY (ERCP) WITH PROPOFOL N/A 02/09/2018   Procedure: ENDOSCOPIC RETROGRADE CHOLANGIOPANCREATOGRAPHY (ERCP) WITH PROPOFOL;  Surgeon: Irving Copas., MD;  Location: [facility];  Service: Gastroenterology;  Laterality: N/A;   ENDOSCOPIC RETROGRADE CHOLANGIOPANCREATOGRAPHY (ERCP) WITH PROPOFOL N/A 03/30/2018   Procedure: ENDOSCOPIC RETROGRADE CHOLANGIOPANCREATOGRAPHY (ERCP) WITH PROPOFOL;  Surgeon: Rush Landmark Telford Nab., MD;  Location: [facility] ENDOSCOPY;  Service: Gastroenterology;  Laterality: N/A;   ENDOSCOPIC RETROGRADE CHOLANGIOPANCREATOGRAPHY (ERCP) WITH PROPOFOL N/A 06/05/2021   Procedure: ENDOSCOPIC RETROGRADE CHOLANGIOPANCREATOGRAPHY (ERCP) WITH PROPOFOL;  Surgeon: Rush Landmark Telford Nab., MD;  Location: [place];  Service: Gastroenterology;  Laterality: N/A;   ERCP N/A 01/08/2018   Procedure: ENDOSCOPIC RETROGRADE CHOLANGIOPANCREATOGRAPHY (ERCP);  Surgeon: Jackquline Denmark,  MD;  Location: [facility] ENDOSCOPY;  Service: Endoscopy;  Laterality: N/A;   ERCP N/A 04/01/2021   Procedure: ENDOSCOPIC RETROGRADE CHOLANGIOPANCREATOGRAPHY (ERCP);  Surgeon: Jackquline Denmark, MD;  Location: [facility] ENDOSCOPY;  Service: Endoscopy;  Laterality: N/A;  Please schedule after 1 PM   ESOPHAGOGASTRODUODENOSCOPY N/A 03/30/2018   Procedure: ESOPHAGOGASTRODUODENOSCOPY (EGD);  Surgeon: Irving Copas., MD;  Location: [facility];  Service: Gastroenterology;  Laterality: N/A;   ESOPHAGOGASTRODUODENOSCOPY (EGD) WITH PROPOFOL N/A 06/06/2018   Procedure: ESOPHAGOGASTRODUODENOSCOPY (EGD) WITH PROPOFOL;  Surgeon: Rush Landmark Telford Nab., MD;  Location: [place];  Service: Gastroenterology;  Laterality: N/A;   ESOPHAGOGASTRODUODENOSCOPY (EGD) WITH PROPOFOL N/A 07/06/2018   Procedure: ESOPHAGOGASTRODUODENOSCOPY (EGD) WITH PROPOFOL;  Surgeon: Rush Landmark Telford Nab., MD;  Location: [place];  Service: Gastroenterology;  Laterality: N/A;   lumbar spine stimulator     POLYPECTOMY  06/06/2018   Procedure: POLYPECTOMY;  Surgeon: Mansouraty, Telford Nab., MD;  Location: [place];  Service: Gastroenterology;;   REMOVAL OF STONES  03/30/2018   Procedure: REMOVAL OF STONES;  Surgeon: Irving Copas., MD;  Location: [facility];  Service: Gastroenterology;;   REMOVAL OF STONES  06/05/2021   Procedure: REMOVAL OF STONES;  Surgeon: Irving Copas., MD;  Location: [place];  Service: Gastroenterology;;   Joan Mayans  01/08/2018   Procedure: Joan Mayans;  Surgeon: Jackquline Denmark, MD;  Location: [place];  Service: Endoscopy;;   SPHINCTEROTOMY  02/09/2018   Procedure: Joan Mayans;  Surgeon: Mansouraty, Telford Nab., MD;  Location: [facility];  Service: Gastroenterology;;   [facility] CHOLANGIOSCOPY N/A 06/05/2021   Procedure: GEZMOQHU CHOLANGIOSCOPY;  Surgeon: Irving Copas., MD;  Location: [place];  Service: Gastroenterology;  Laterality: N/A;   STENT REMOVAL  02/09/2018    Procedure: STENT REMOVAL;  Surgeon: Irving Copas., MD;  Location: [facility];  Service: Gastroenterology;;   Lavell Islam REMOVAL  03/30/2018   Procedure: STENT REMOVAL;  Surgeon: Irving Copas., MD;  Location: [facility];  Service: Gastroenterology;;   Lavell Islam REMOVAL  06/05/2021   Procedure: STENT REMOVAL;  Surgeon: Irving Copas., MD;  Location: [place];  Service: Gastroenterology;;   TRANSTHORACIC ECHOCARDIOGRAM  04/2015   EF 40-45%, mod antsept, ant, antlat, apical HK, mild AI/MR, PASP 57mmHg.   UVULOPALATOPHARYNGOPLASTY, TONSILLECTOMY AND SEPTOPLASTY  2002   Social History   Socioeconomic History   Marital status: Married    Spouse name: Not on file   Number of children: 3   Years of education: Not on file   Highest education level: Not on file  Occupational History   Occupation: retired  Tobacco Use   Smoking status: Former    Types: Cigarettes    Quit date: 1972    Years since quitting: 51.1   Smokeless tobacco: Never   Tobacco comments:    was very light  Vaping Use   Vaping Use: Never used  Substance and Sexual Activity   Alcohol use: Not Currently    Comment: ocassional -maybe 2 beers   Drug use: No   Sexual activity: Yes  Other Topics Concern   Not on file  Social History Narrative   Caffeine: 1 cup soda, occasional coffee   Lives with wife [name]) and 69 yo son, 4 dogs   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   Activity: no regular activity   Diet: overall healthy, good fruits and vegetables, good amt water   Social Determinants of Radio broadcast assistant Strain: Not on file  Food Insecurity: Not on file  Transportation Needs: Not on file  Physical Activity: Not on file  Stress: Not on file  Social Connections: Not on file  Intimate Partner Violence: Not on file   Family History  Problem Relation Age of Onset   Diabetes Mother    Heart disease Mother    Cancer  Sister 84       male cancer   Breast cancer Brother 79   Hypertension Maternal Aunt    Cancer Maternal Uncle        spinal   Coronary artery disease Neg Hx    Stroke Neg Hx    Colon cancer Neg Hx    Pancreatic cancer Neg Hx    Stomach cancer Neg Hx    Esophageal cancer  Neg Hx    Inflammatory bowel disease Neg Hx    Liver disease Neg Hx    Rectal cancer Neg Hx    I have reviewed his medical, social, and family history in detail and updated the electronic medical record as necessary.    PHYSICAL EXAMINATION  BP 136/78    Pulse 77    Ht 5\' 11"  (1.803 m)    Wt 219 lb (99.3 kg)    BMI 30.54 kg/m  Wt Readings from Last 3 Encounters:  07/17/21 219 lb (99.3 kg)  06/05/21 205 lb (93 kg)  04/16/21 220 lb 6.4 oz (100 kg)  GEN: NAD, appears stated age, not accompanied by wife today PSYCH: Cooperative, without pressured speech EYE: Conjunctivae pink, sclerae anicteric ENT: MMM CV: Nontachycardic RESP: No audible wheezing GI: NABS, soft, NT/ND, surgical scars appreciated, without rebound MSK/EXT: No significant lower extremity edema SKIN: No jaundice NEURO:  Alert & Oriented x 3, no focal deficits   REVIEW OF DATA  I reviewed the following data at the time of this encounter:  GI Procedures and Studies  January 2023 ERCP - No gross lesions in esophagus. Z-line irregular, 37 cm from the incisors. - 3 cm hiatal hernia. - Gastritis - biopsied for HP evaluation. - Significant J-shaped deformity requiring left lateral positioning to allow scope passage. - Acquired duodenal stenosis proximal and distal to the ampulla. These could be traversed with therapeutic Endoscope but not with the Duodenoscope (distal narrowing). - Prior biliary sphincterotomy appeared open. - One partially occluded stent from the biliary tree was seen in the major papilla - removed and sent for cytology. - Filling defects consistent with sludge stones were seen on the cholangiogram with significant biliary  dilation.. - A very minimal biliary narrowing was noted distally.  Sphincteroplasty performed.  Cytology brushings performed - Choledocholithiasis (formed sludge stones rather than gallstones) were found including sludge. Complete removal was accomplished by sweeping with a 15-18 mm balloon. - Spyscope performed and no evidence of any retained stones/debris. Very minimal abnormal biliary tract mucosa was found distally - biopsied. - Suspect that the angulation of the distal bile duct due to the stenosis proximal to the ampulla is a reason that may allow the patient to develop recurrent sludge/stones.  Pathology FINAL MICROSCOPIC DIAGNOSIS:  A. COMMON BILE DUCT, BILIARY STENT  - No malignant cells identified  B. DISTAL BILE DUCT, BRUSHING  - Atypical cells present  FINAL MICROSCOPIC DIAGNOSIS:  A. STOMACH, BIOPSY:  - Gastric antral and oxyntic mucosa with no specific histopathologic  changes  - Warthin Starry stain is negative for Helicobacter pylori  B. BILE DUCT, DISTAL, BIOPSY:  - Scant fragment of biliary mucosa  - Negative for dysplasia or malignancy   Laboratory Studies  Reviewed in epic  Imaging Studies  No new imaging to review   ASSESSMENT  Mr. Stary is a 71 y.o. male with a pmh significant for CAD, HTN, pAFib, s/p CCK, hx of recurrent choledocholithiasis (status post ERCP with sphincterotomy and balloon sweeping), biliary narrowing (atypical cells on brushings), colon polyps (tubular adenoma), duodenal stricture (status post dilation).  The patient is seen today for evaluation and management of:  1. Biliary stricture   2. Choledocholithiasis   3. Duodenal stenosis   4. Bloating   5. Change in bowel habits    The patient is hemodynamically and clinically stable.  He has done well since his last ERCP.  His duodenal stricture will need to be dilated that is our plan at our  upcoming procedure.  Due to the atypical cells that were found even though biliary tract spyglass  biopsies were negative, I am going to proceed with an EUS to evaluate the bile duct and pancreas.  Thankfully imaging has not shown any evidence of any mass or lesion.  I suspect this is a result of his previous inflammatory process.  I did discuss with the patient that he is at risk unfortunately due to the size of his biliary duct for recurrent sludge stones but hopefully this will not be the case.  He will let us know if other issues develop in the future.  Hopefully we can dilate his duodenal stricture with just 1 attempt but he may require multiple dilations that he did previously.  That tremendously helped his previous bloating issues.  We did discuss potential use of cholestyramine in the future for his alteration of bowel habits and consideration of small intestine bacterial overgrowth work-up/evaluation.  He would like to wait on further work-up and management of that until after his upcoming procedure.  The risks of an EUS including intestinal perforation, bleeding, infection, aspiration, and medication effects were discussed as was the possibility it may not give a definitive diagnosis if a biopsy is performed.  When a biopsy of the pancreas is done as part of the EUS, there is an additional risk of pancreatitis at the rate of about 1-2%.  It was explained that procedure related pancreatitis is typically mild, although it can be severe and even life threatening, which is why we do not perform random pancreatic biopsies and only biopsy a lesion/area we feel is concerning enough to warrant the risk. The risks and benefits of endoscopic evaluation were discussed with the patient; these include but are not limited to the risk of perforation, infection, bleeding, missed lesions, lack of diagnosis, severe illness requiring hospitalization, as well as anesthesia and sedation related illnesses.  The patient and/or family is agreeable to proceed.  All patient questions were answered to the best of my ability, and  the patient agrees to the aforementioned plan of action with follow-up as indicated.   PLAN  Continue PPI twice daily Proceed with scheduled EUS/EGD with duodenal dilation and fluoroscopy available next week Consider SIBO evaluation in future Consider EPI evaluation in future Consider cholestyramine therapy in future Colonoscopy in 2025 for surveillance   No orders of the defined types were placed in this encounter.   New Prescriptions   No medications on file   Modified Medications   No medications on file    Planned Follow Up: No follow-ups on file.   Total Time in Face-to-Face and in Coordination of Care for patient including independent/personal interpretation/review of prior testing, medical history, examination, medication adjustment, communicating results with the patient directly, and documentation within the EHR is 25 minutes.   Justice Britain, MD [facility] Advanced [facility]oscopy Office # [phone]

## 2021-07-17 NOTE — Patient Instructions (Signed)
You have been scheduled for an endoscopy. Please follow written instructions given to you at your visit today. If you use inhalers (even only as needed), please bring them with you on the day of your procedure.  If you are age 72 or older, your body mass index should be between 23-30. Your Body mass index is 30.54 kg/m. If this is out of the aforementioned range listed, please consider follow up with your Primary Care Provider.  ________________________________________________________  The [place] [facility] providers would like to encourage you to use [facility] to communicate with providers for non-urgent requests or questions.  Due to long hold times on the telephone, sending your provider a message by [facility] may be a faster and more efficient way to get a response.  Please allow 48 business hours for a response.  Please remember that this is for non-urgent requests.  _______________________________________________________  Thank you for choosing me and [facility].  Dr. Rush Landmark

## 2021-07-18 ENCOUNTER — Encounter: Payer: Self-pay | Admitting: Gastroenterology

## 2021-07-18 DIAGNOSIS — K831 Obstruction of bile duct: Secondary | ICD-10-CM | POA: Insufficient documentation

## 2021-07-18 DIAGNOSIS — R140 Abdominal distension (gaseous): Secondary | ICD-10-CM | POA: Insufficient documentation

## 2021-07-24 ENCOUNTER — Ambulatory Visit (HOSPITAL_COMMUNITY): Payer: Medicare Other | Admitting: Anesthesiology

## 2021-07-24 ENCOUNTER — Ambulatory Visit (HOSPITAL_COMMUNITY)
Admission: RE | Admit: 2021-07-24 | Discharge: 2021-07-24 | Disposition: A | Discharge status: Home / Self Care | Payer: Medicare Other | Attending: Gastroenterology | Admitting: Gastroenterology

## 2021-07-24 ENCOUNTER — Ambulatory Visit (HOSPITAL_BASED_OUTPATIENT_CLINIC_OR_DEPARTMENT_OTHER): Payer: Medicare Other | Admitting: Anesthesiology

## 2021-07-24 ENCOUNTER — Encounter (HOSPITAL_COMMUNITY): Payer: Self-pay | Admitting: Gastroenterology

## 2021-07-24 ENCOUNTER — Encounter (HOSPITAL_COMMUNITY)
Admission: RE | Disposition: A | Discharge status: Home / Self Care | Payer: Self-pay | Source: Home / Self Care | Attending: Gastroenterology

## 2021-07-24 ENCOUNTER — Other Ambulatory Visit: Payer: Self-pay

## 2021-07-24 DIAGNOSIS — K831 Obstruction of bile duct: Secondary | ICD-10-CM

## 2021-07-24 DIAGNOSIS — I4891 Unspecified atrial fibrillation: Secondary | ICD-10-CM | POA: Diagnosis not present

## 2021-07-24 DIAGNOSIS — K869 Disease of pancreas, unspecified: Secondary | ICD-10-CM | POA: Insufficient documentation

## 2021-07-24 DIAGNOSIS — K3189 Other diseases of stomach and duodenum: Secondary | ICD-10-CM

## 2021-07-24 DIAGNOSIS — E785 Hyperlipidemia, unspecified: Secondary | ICD-10-CM | POA: Diagnosis not present

## 2021-07-24 DIAGNOSIS — K8080 Other cholelithiasis without obstruction: Secondary | ICD-10-CM

## 2021-07-24 DIAGNOSIS — I10 Essential (primary) hypertension: Secondary | ICD-10-CM | POA: Diagnosis not present

## 2021-07-24 DIAGNOSIS — K839 Disease of biliary tract, unspecified: Secondary | ICD-10-CM | POA: Diagnosis not present

## 2021-07-24 DIAGNOSIS — K449 Diaphragmatic hernia without obstruction or gangrene: Secondary | ICD-10-CM | POA: Insufficient documentation

## 2021-07-24 DIAGNOSIS — K8050 Calculus of bile duct without cholangitis or cholecystitis without obstruction: Secondary | ICD-10-CM | POA: Insufficient documentation

## 2021-07-24 DIAGNOSIS — K315 Obstruction of duodenum: Secondary | ICD-10-CM

## 2021-07-24 DIAGNOSIS — I480 Paroxysmal atrial fibrillation: Secondary | ICD-10-CM | POA: Insufficient documentation

## 2021-07-24 DIAGNOSIS — R140 Abdominal distension (gaseous): Secondary | ICD-10-CM | POA: Insufficient documentation

## 2021-07-24 DIAGNOSIS — I2510 Atherosclerotic heart disease of native coronary artery without angina pectoris: Secondary | ICD-10-CM | POA: Diagnosis not present

## 2021-07-24 DIAGNOSIS — R194 Change in bowel habit: Secondary | ICD-10-CM | POA: Insufficient documentation

## 2021-07-24 DIAGNOSIS — I899 Noninfective disorder of lymphatic vessels and lymph nodes, unspecified: Secondary | ICD-10-CM | POA: Diagnosis not present

## 2021-07-24 HISTORY — PX: BALLOON DILATION: SHX5330

## 2021-07-24 HISTORY — PX: ESOPHAGOGASTRODUODENOSCOPY (EGD) WITH PROPOFOL: SHX5813

## 2021-07-24 HISTORY — PX: EUS: SHX5427

## 2021-07-24 SURGERY — ESOPHAGOGASTRODUODENOSCOPY (EGD) WITH PROPOFOL
Anesthesia: General

## 2021-07-24 MED ORDER — LACTATED RINGERS IV SOLN
INTRAVENOUS | Status: DC | PRN
Start: 2021-07-24 — End: 2021-07-24

## 2021-07-24 MED ORDER — EPHEDRINE SULFATE-NACL 50-0.9 MG/10ML-% IV SOSY
PREFILLED_SYRINGE | INTRAVENOUS | Status: DC | PRN
  Administered 2021-07-24: 5 mg via INTRAVENOUS

## 2021-07-24 MED ORDER — FENTANYL CITRATE (PF) 100 MCG/2ML IJ SOLN
INTRAMUSCULAR | Status: AC
  Filled 2021-07-24: qty 2

## 2021-07-24 MED ORDER — PROPOFOL 10 MG/ML IV BOLUS
INTRAVENOUS | Status: DC | PRN
  Administered 2021-07-24: 130 mg via INTRAVENOUS

## 2021-07-24 MED ORDER — LIDOCAINE 2% (20 MG/ML) 5 ML SYRINGE
INTRAMUSCULAR | Status: DC | PRN
Start: 2021-07-24 — End: 2021-07-24
  Administered 2021-07-24: 100 mg via INTRAVENOUS

## 2021-07-24 MED ORDER — SUCCINYLCHOLINE CHLORIDE 200 MG/10ML IV SOSY
PREFILLED_SYRINGE | INTRAVENOUS | Status: DC | PRN
  Administered 2021-07-24: 160 mg via INTRAVENOUS

## 2021-07-24 MED ORDER — ONDANSETRON HCL 4 MG/2ML IJ SOLN
INTRAMUSCULAR | Status: DC | PRN
Start: 2021-07-24 — End: 2021-07-24
  Administered 2021-07-24: 4 mg via INTRAVENOUS

## 2021-07-24 MED ORDER — SODIUM CHLORIDE 0.9 % IV SOLN: INTRAVENOUS | Status: DC

## 2021-07-24 MED ORDER — DEXAMETHASONE SODIUM PHOSPHATE 10 MG/ML IJ SOLN
INTRAMUSCULAR | Status: DC | PRN
  Administered 2021-07-24: 4 mg via INTRAVENOUS

## 2021-07-24 SURGICAL SUPPLY — 15 items

## 2021-07-24 NOTE — Anesthesia Postprocedure Evaluation (Signed)
Anesthesia Post Note  Patient: OM LIZOTTE  Procedure(s) Performed: ESOPHAGOGASTRODUODENOSCOPY (EGD) WITH PROPOFOL WITH FLUOROSCOPY UPPER ENDOSCOPIC ULTRASOUND (EUS) RADIAL BALLOON DILATION     Patient location during evaluation: PACU Anesthesia Type: General Level of consciousness: awake and alert Pain management: pain level controlled Vital Signs Assessment: post-procedure vital signs reviewed and stable Respiratory status: spontaneous breathing, nonlabored ventilation, respiratory function stable and patient connected to nasal cannula oxygen Cardiovascular status: blood pressure returned to baseline and stable Postop Assessment: no apparent nausea or vomiting Anesthetic complications: no   No notable events documented.  Last Vitals:  Vitals:   07/24/21 1115 07/24/21 1130  BP: 103/84 114/77  Pulse: 60 (!) 56  Resp: 14 14  Temp: 36.4 C 36.5 C  SpO2: 99% 95%    Last Pain:  Vitals:   07/24/21 1130  TempSrc:   PainSc: 0-No pain                 Estevon Fluke

## 2021-07-24 NOTE — Op Note (Signed)
[facility] Patient Name: Alexander [name] Procedure Date : 07/24/2021 MRN: [id number] Attending MD: Justice Britain , MD Date of Birth: Oct 20, 1950 CSN: [id number] Age: 71 Admit Type: Inpatient Procedure:                Upper EUS Indications:              Atypical cells on biliary brushings with negative                            imaging to rule out an occult malignancy/lesion Providers:                Justice Britain, MD, Jeanella Cara, RN,                            Tyna Jaksch Technician Referring MD:             [name] Medicines:                General Anesthesia Complications:            No immediate complications. Estimated Blood Loss:     Estimated blood loss: none. Estimated blood loss                            was minimal. Procedure:                Pre-Anesthesia Assessment:                           - Prior to the procedure, a History and Physical                            was performed, and patient medications and                            allergies were reviewed. [name] tolerance of                            previous anesthesia was also reviewed. The risks                            and benefits of the procedure and the sedation                            options and risks were discussed with the patient.                            All questions were answered, and informed consent                            was obtained. Prior Anticoagulants: The patient has                            taken no previous anticoagulant or antiplatelet  agents. ASA Grade Assessment: III - A patient with                            severe systemic disease. After reviewing the risks                            and benefits, the patient was deemed in                            satisfactory condition to undergo the procedure.                           After obtaining informed consent, the endoscope was                             passed under direct vision. Throughout the                            procedure, the patient's blood pressure, pulse, and                            oxygen saturations were monitored continuously. The                            [id number]-H190 ([id number]) Olympus endoscope was introduced                            through the mouth, and advanced to the fourth part                            of duodenum. The [id number]-Q190V ([id number]) Olympus                            duodenoscope was introduced through the mouth, and                            advanced to the area of papilla. The GF-[id number]                            ([id number]) Olympus linear ultrasound scope was                            introduced through the mouth, and advanced to the                            duodenum for ultrasound examination from the                            stomach and duodenum. After obtaining informed                            consent, the endoscope was passed under direct  vision. Throughout the procedure, the patient's                            blood pressure, pulse, and oxygen saturations were                            monitored continuously.The upper EUS was                            accomplished without difficulty. The patient                            tolerated the procedure. Scope In: Scope Out: Findings:      ENDOSCOPIC FINDING: :      No gross lesions were noted in the entire esophagus.      The Z-line was regular and was found 37 cm from the incisors.      A 1 cm hiatal hernia was present.      Patchy mildly erythematous mucosa without bleeding was found in the       entire examined stomach. Previously biopsied and negative for HP so not       rebiopsied today.      An acquired benign-appearing, intrinsic moderate stenosis was found in       the area just proximal to the papilla and was traversed with the       standard EGD scope and the ERCP scope (with some pressure). After  the       rest of the EUS was completed, a TTS dilator was passed through the       scope. Dilation with a 12-13.5-15 mm and a 15-16.5-18 mm pyloric balloon       dilator was performed up to a maximum of 18 mm. Mucosal wrent was noted       after final dilation.      There was evidence of a patent sphincterotomy at the major papilla.      A moderate angulation deformity was found in the area distal to the       papilla and extending into the third portion of the duodenum. This is       not felt to be related to a stricture but rather angulation (I had       previously described this in the past).      Normal mucosa was found in the entire visualized duodenum.      ENDOSONOGRAPHIC FINDING: :      Endosonographic imaging of the ampulla showed no mass.      Pancreatic parenchymal abnormalities were noted in the entire pancreas.       These consisted of diffuse echogenicity.      The pancreatic duct had a normal endosonographic appearance in the       pancreatic head (2.2 mm), genu of the pancreas (1.5 mm), body of the       pancreas (1.1 mm) and tail of the pancreas (0.8 mm).      One definite stone and a possible stone vs pneumobilia were visualized       endosonographically in the common bile duct and in the common hepatic       duct. The largest stone in the CHD region measured up to 5 mm in       greatest dimension.  One stone was rounded the other was linear (could       end up being pneumobilia) They were hyperechoic and characterized by       shadowing both.      Endosonographic imaging in the common bile duct showed no stricture.      Endosonographic imaging in the visualized portion of the liver showed no       mass.      No malignant-appearing lymph nodes were visualized in the celiac region       (level 20), peripancreatic region and porta hepatis region.      The celiac region was visualized. Impression:               EGD Impression:                           - No gross lesions  in esophagus. Z-line regular, 37                            cm from the incisors.                           - 1 cm hiatal hernia.                           - Erythematous mucosa in the stomach - previously                            biopsied and negative.                           - Acquired duodenal stenosis just proximal to the                            ampulla. Dilated up to 18 mm with mucosal wrenting.                           - Patent sphincterotomy, characterized by healthy                            appearing mucosa was found.                           - Duodenal angulation deformity distal to the                            ampulla without fixed stricture.                           - Normal mucosa was found in the entire examined                            duodenum.                           EUS Impression:                           -  Pancreatic parenchymal abnormalities consisting                            of diffuse echogenicity were noted in the entire                            pancreas.                           - The pancreatic duct had a normal endosonographic                            appearance in the pancreatic head, genu of the                            pancreas, body of the pancreas and tail of the                            pancreas.                           - One definite stone and another possible stone vs                            pneumobilia were visualized endosonographically in                            the common bile duct and in the common hepatic duct.                           - No malignant-appearing lymph nodes were                            visualized in the celiac region (level 20),                            peripancreatic region and porta hepatis region.                           - No evidence of a mass or lesion in the HOP or                            significant biliary stricturing present. Recommendation:           - The patient will be  observed post-procedure,                            until all discharge criteria are met.                           - Discharge patient to home.                           - Patient has a contact number available for  emergencies. The signs and symptoms of potential                            delayed complications were discussed with the                            patient. Return to normal activities tomorrow.                            Written discharge instructions were provided to the                            patient.                           - Low fat diet.                           - Observe patient's clinical course.                           - Proceed with scheduling ERCP if patient/wife                            desire. It is possible that the stone/biodebris                            could pass through the previous sphincterotomy,                            though with it's presence could act as a nidus for                            issues in the future as he had with previous                            recurrent choledocholithiasis. If we decide to                            perform this procedure, then I would also recommend                            repeat dilation of the duodenal stricture and try                            to get it up to 20 mm if possible. Otherwise could                            just perform EGD with repeat dilation up to 20 mm.                           - Observe patient's clinical course.                           -  The findings and recommendations were discussed                            with the patient.                           - The findings and recommendations were discussed                            with the patient's family. Procedure Code(s):        --- Professional ---                           [phone], Esophagogastroduodenoscopy, flexible,                            transoral; with endoscopic ultrasound  examination                            limited to the esophagus, stomach or duodenum, and                            adjacent structures                           43245, Esophagogastroduodenoscopy, flexible,                            transoral; with dilation of gastric/duodenal                            stricture(s) (eg, balloon, bougie) Diagnosis Code(s):        --- Professional ---                           K44.9, Diaphragmatic hernia without obstruction or                            gangrene                           K31.89, Other diseases of stomach and duodenum                           K31.5, Obstruction of duodenum                           Z98.890, Other specified postprocedural states                           K86.9, Disease of pancreas, unspecified                           K80.50, Calculus of bile duct without cholangitis                            or cholecystitis without obstruction  I89.9, Noninfective disorder of lymphatic vessels                            and lymph nodes, unspecified CPT copyright 2019 [employer]. All rights reserved. The codes documented in this report are preliminary and upon coder review may  be revised to meet current compliance requirements. Justice Britain, MD 07/24/2021 11:28:23 AM Number of Addenda: 0

## 2021-07-24 NOTE — Transfer of Care (Signed)
Immediate Anesthesia Transfer of Care Note  Patient: [name]  Procedure(s) Performed: ESOPHAGOGASTRODUODENOSCOPY (EGD) WITH PROPOFOL WITH FLUOROSCOPY UPPER ENDOSCOPIC ULTRASOUND (EUS) RADIAL BALLOON DILATION  Patient Location: PACU  Anesthesia Type:General  Level of Consciousness: drowsy and patient cooperative  Airway & Oxygen Therapy: Patient Spontanous Breathing and Patient connected to nasal cannula oxygen  Post-op Assessment: Report given to RN and Post -op Vital signs reviewed and stable  Post vital signs: Reviewed and stable  Last Vitals:  Vitals Value Taken Time  BP 103/84 07/24/21 1113  Temp    Pulse 61 07/24/21 1113  Resp 16 07/24/21 1113  SpO2 100 % 07/24/21 1113  Vitals shown include unvalidated device data.  Last Pain:  Vitals:   07/24/21 0839  TempSrc: Oral  PainSc: 0-No pain         Complications: No notable events documented.

## 2021-07-24 NOTE — Anesthesia Procedure Notes (Signed)
Procedure Name: Intubation Date/Time: 07/24/2021 10:08 AM Performed by: Lowella Dell, CRNA Pre-anesthesia Checklist: Patient identified, Emergency Drugs available, Suction available and Patient being monitored Patient Re-evaluated:Patient Re-evaluated prior to induction Oxygen Delivery Method: Circle System Utilized Preoxygenation: Pre-oxygenation with 100% oxygen Induction Type: IV induction Ventilation: Mask ventilation without difficulty Laryngoscope Size: Mac and 4 Tube type: Oral Number of attempts: 1 Airway Equipment and Method: Stylet and Oral airway Placement Confirmation: ETT inserted through vocal cords under direct vision, positive ETCO2 and breath sounds checked- equal and bilateral Secured at: 22 cm Tube secured with: Tape Dental Injury: Teeth and Oropharynx as per pre-operative assessment

## 2021-07-24 NOTE — Anesthesia Preprocedure Evaluation (Signed)
Anesthesia Evaluation  Patient identified by MRN, date of birth, ID band Patient awake    Reviewed: Allergy & Precautions, H&P , NPO status , Patient's Chart, lab work & pertinent test results  Airway Mallampati: I   Neck ROM: full    Dental  (+)    Pulmonary former smoker,    breath sounds clear to auscultation       Cardiovascular hypertension, + CAD, + Past MI and + Cardiac Stents  + dysrhythmias Atrial Fibrillation  Rhythm:regular Rate:Normal  TTE (07/2020): EF 60%, normal valve structure and function.   Neuro/Psych  Headaches,    GI/Hepatic GERD  ,  Endo/Other    Renal/GU      Musculoskeletal  (+) Arthritis ,   Abdominal   Peds  Hematology   Anesthesia Other Findings   Reproductive/Obstetrics                             Anesthesia Physical  Anesthesia Plan  ASA: 3  Anesthesia Plan: General   Post-op Pain Management: Minimal or no pain anticipated   Induction: Intravenous  PONV Risk Score and Plan: 2 and Ondansetron, Dexamethasone and Treatment may vary due to age or medical condition  Airway Management Planned: Oral ETT  Additional Equipment: None  Intra-op Plan:   Post-operative Plan: Extubation in OR  Informed Consent: I have reviewed the patients History and Physical, chart, labs and discussed the procedure including the risks, benefits and alternatives for the proposed anesthesia with the patient or authorized representative who has indicated his/her understanding and acceptance.     Dental advisory given  Plan Discussed with: CRNA, Anesthesiologist and Surgeon  Anesthesia Plan Comments:         Anesthesia Quick Evaluation

## 2021-07-24 NOTE — Interval H&P Note (Signed)
History and Physical Interval Note:  07/24/2021 7:48 AM  Alexander [name]  has presented today for surgery, with the diagnosis of CBD stones.  The various methods of treatment have been discussed with the patient and family. After consideration of risks, benefits and other options for treatment, the patient has consented to  Procedure(s): ESOPHAGOGASTRODUODENOSCOPY (EGD) WITH PROPOFOL WITH FLUOROSCOPY (N/A) UPPER ENDOSCOPIC ULTRASOUND (EUS) RADIAL (N/A) as a surgical intervention.  [name] history has been reviewed, patient examined, no change in status, stable for surgery.  I have reviewed the patient's chart and labs.  Questions were answered to the patient's satisfaction.    Plan to evaluate the HOP.  Plan to proceed with duodenal stricture dilation.  The risks of an EUS including intestinal perforation, bleeding, infection, aspiration, and medication effects were discussed as was the possibility it may not give a definitive diagnosis if a biopsy is performed.  When a biopsy of the pancreas is done as part of the EUS, there is an additional risk of pancreatitis at the rate of about 1-2%.  It was explained that procedure related pancreatitis is typically mild, although it can be severe and even life threatening, which is why we do not perform random pancreatic biopsies and only biopsy a lesion/area we feel is concerning enough to warrant the risk.  The risks and benefits of endoscopic evaluation were discussed with the patient; these include but are not limited to the risk of perforation, infection, bleeding, missed lesions, lack of diagnosis, severe illness requiring hospitalization, as well as anesthesia and sedation related illnesses.  The patient and/or family is agreeable to proceed.    [employer]

## 2021-07-25 ENCOUNTER — Telehealth: Payer: Self-pay

## 2021-07-25 ENCOUNTER — Encounter (HOSPITAL_COMMUNITY): Payer: Self-pay | Admitting: Gastroenterology

## 2021-07-25 ENCOUNTER — Other Ambulatory Visit: Payer: Self-pay

## 2021-07-25 DIAGNOSIS — K831 Obstruction of bile duct: Secondary | ICD-10-CM

## 2021-07-25 DIAGNOSIS — K8050 Calculus of bile duct without cholangitis or cholecystitis without obstruction: Secondary | ICD-10-CM

## 2021-07-25 DIAGNOSIS — K315 Obstruction of duodenum: Secondary | ICD-10-CM

## 2021-07-25 NOTE — Telephone Encounter (Signed)
The pt has been scheduled for ERCP on 09/15/21 at [facility] with GM at 1015 am Left message on machine to call back

## 2021-07-25 NOTE — Telephone Encounter (Signed)
-----   Message from Irving Copas., MD sent at 07/24/2021 11:40 AM EST ----- Regarding: Follow-up Alexander [name], This patient needs a repeat ERCP for findings of choledocholithiasis on EUS.  At the same time we will also do a duodenal stricture dilation.  Please reach out to them tomorrow and schedule as able in a typical ERCP time slot. Thanks. GM

## 2021-07-28 NOTE — Telephone Encounter (Signed)
EUS scheduled, pt instructed and medications reviewed.  Patient instructions mailed to home and sent to My Chart .  Patient to call with any questions or concerns.  

## 2021-07-30 ENCOUNTER — Encounter: Payer: Self-pay | Admitting: Gastroenterology

## 2021-07-30 DIAGNOSIS — K831 Obstruction of bile duct: Secondary | ICD-10-CM

## 2021-07-30 DIAGNOSIS — R109 Unspecified abdominal pain: Secondary | ICD-10-CM

## 2021-07-31 ENCOUNTER — Other Ambulatory Visit: Payer: Self-pay

## 2021-07-31 ENCOUNTER — Other Ambulatory Visit (INDEPENDENT_AMBULATORY_CARE_PROVIDER_SITE_OTHER): Payer: Medicare Other

## 2021-07-31 ENCOUNTER — Ambulatory Visit (INDEPENDENT_AMBULATORY_CARE_PROVIDER_SITE_OTHER)
Admission: RE | Admit: 2021-07-31 | Discharge: 2021-07-31 | Disposition: A | Discharge status: Home / Self Care | Payer: Medicare Other | Source: Ambulatory Visit | Attending: Gastroenterology | Admitting: Gastroenterology

## 2021-07-31 DIAGNOSIS — K831 Obstruction of bile duct: Secondary | ICD-10-CM

## 2021-07-31 DIAGNOSIS — R109 Unspecified abdominal pain: Secondary | ICD-10-CM

## 2021-07-31 LAB — COMPREHENSIVE METABOLIC PANEL
ALT: 16 U/L (ref 0–53)
AST: 16 U/L (ref 0–37)
Albumin: 4.3 g/dL (ref 3.5–5.2)
Alkaline Phosphatase: 50 U/L (ref 39–117)
BUN: 16 mg/dL (ref 6–23)
CO2: 25 mEq/L (ref 19–32)
Calcium: 9.3 mg/dL (ref 8.4–10.5)
Chloride: 105 mEq/L (ref 96–112)
Creatinine, Ser: 0.57 mg/dL (ref 0.40–1.50)
GFR: 98.7 mL/min (ref >60.00)
Glucose, Bld: 106 mg/dL — ABNORMAL HIGH (ref 70–99)
Potassium: 3.9 mEq/L (ref 3.5–5.1)
Sodium: 137 mEq/L (ref 135–145)
Total Bilirubin: 0.7 mg/dL (ref 0.2–1.2)
Total Protein: 7.4 g/dL (ref 6.0–8.3)

## 2021-07-31 LAB — CBC WITH DIFFERENTIAL/PLATELET
Basophils Absolute: 0.1 10*3/uL (ref 0.0–0.1)
Basophils Relative: 0.9 % (ref 0.0–3.0)
Eosinophils Absolute: 0.3 10*3/uL (ref 0.0–0.7)
Eosinophils Relative: 3.3 % (ref 0.0–5.0)
HCT: 42.7 % (ref 39.0–52.0)
Hemoglobin: 14.9 g/dL (ref 13.0–17.0)
Lymphocytes Relative: 43 % (ref 12.0–46.0)
Lymphs Abs: 4.1 10*3/uL — ABNORMAL HIGH (ref 0.7–4.0)
MCHC: 34.9 g/dL (ref 30.0–36.0)
MCV: 94.4 fl (ref 78.0–100.0)
Monocytes Absolute: 0.6 10*3/uL (ref 0.1–1.0)
Monocytes Relative: 6.5 % (ref 3.0–12.0)
Neutro Abs: 4.4 10*3/uL (ref 1.4–7.7)
Neutrophils Relative %: 46.3 % (ref 43.0–77.0)
Platelets: 224 10*3/uL (ref 150.0–400.0)
RBC: 4.52 Mil/uL (ref 4.22–5.81)
RDW: 13.3 % (ref 11.5–15.5)
WBC: 9.4 10*3/uL (ref 4.0–10.5)

## 2021-07-31 LAB — LIPASE: Lipase: 23 U/L (ref 11.0–59.0)

## 2021-07-31 LAB — AMYLASE: Amylase: 27 U/L (ref 27–131)

## 2021-07-31 NOTE — Telephone Encounter (Signed)
Patty, ?Please have the patient come in for a CBC/CMP/lipase/amylase later today or tomorrow (as stat labs). ?Please have the patient also obtain a KUB 2 view for abdominal pain and history duodenal stenosis. ?I do not think I have any availability based on things currently for add on tomorrow. ?Do any of the APP's have any availability tomorrow morning or in the afternoon? ?Please ensure that he is taking MiraLAX daily to get his bowels moving as well. ?Thanks. ?GM ? ?

## 2021-08-01 ENCOUNTER — Ambulatory Visit: Payer: Medicare Other | Admitting: Physician Assistant

## 2021-08-01 ENCOUNTER — Encounter: Payer: Self-pay | Admitting: Physician Assistant

## 2021-08-01 VITALS — BP 120/70 | HR 57 | Ht 71.0 in | Wt 220.0 lb

## 2021-08-01 DIAGNOSIS — R1010 Upper abdominal pain, unspecified: Secondary | ICD-10-CM | POA: Diagnosis not present

## 2021-08-01 DIAGNOSIS — K831 Obstruction of bile duct: Secondary | ICD-10-CM

## 2021-08-01 DIAGNOSIS — K315 Obstruction of duodenum: Secondary | ICD-10-CM

## 2021-08-01 NOTE — Progress Notes (Signed)
Subjective:    Patient ID: Alexander [name], male    DOB: 1950-08-16, 71 y.o.   MRN: [id number]  HPI [name] is a pleasant 71 year old male, established with Dr. Rush Landmark, with history of coronary artery disease, hypertension, history of atrial fibrillation, interstitial lung disease, and sleep apnea as well as adenomatous colon polyps. He has been undergoing evaluation for recurrent choledocholithiasis, and is status post ERCP with sphincterotomy and balloon sweeping January 2023.  Bile duct brushings had shown atypical cells, and he was also found to have a duodenal stricture. Patient underwent upper EUS on 07/24/2021 with finding of patchy gastritis, small hiatal hernia and a benign appearing acquired intrinsic moderate stenosis just proximal to the papillae  which was balloon dilated from 12 to 15 mm and then 15 to 18 mm, evidence of patent sphincterotomy   no ampullary mass, diffuse pancreatic echogenicity, normal-appearing pancreatic duct ,no common bile duct stricture but was found to have 1 definite common bile duct stone and 1 possible stone versus pneumobilia, largest stone in the common hepatic duct measuring up to 5 mm. No malignant appearing lymph nodes. Plan was for patient to be scheduled for ERCP with stone extraction at a later date.  Patient had called earlier this week, wife concerned as he just has not been feeling well over the past 2 weeks. No severe abdominal pain, just complains of an uncomfortable feeling after eating, in the upper abdomen, no nausea or vomiting.  advised to come for labs yesterday-which showed WBC 9.4, hemoglobin 14.9 LFTs within normal limits, lipase 23  2 view abdomen done, report pending.  Patient and wife have been concerned because he had problems in the past with cholangitis.  He has not had any fever, or shaking chills.  Apparently he stays cold feeling all of the time.  Again denies any acute abdominal pain, and has been tolerating p.o.'s though  with mild discomfort postprandially.  He says his appetite is good and he feels certain that he has been eating too much and that this has been aggravating his symptoms. He does have issues with constipation and has already been taking MiraLAX, does not feel constipated presently.  Review of Systems. Pertinent positive and negative review of systems were noted in the above HPI section.  All other review of systems was otherwise negative.   Outpatient Encounter Medications as of 08/01/2021  Medication Sig   acetaminophen (TYLENOL) 500 MG tablet Take 1,000 mg by mouth every 6 (six) hours as needed for moderate pain or headache.    Ascorbic Acid (VITAMIN C) 1000 MG tablet Take 1,000 mg by mouth daily.   Carboxymethylcellulose Sodium (LUBRICANT EYE DROPS OP) Place 2 drops into both eyes daily as needed (for dry eyes).   Cholecalciferol (VITAMIN D3) 50 MCG (2000 UT) TABS Take 2,000 Units by mouth daily.   gabapentin (NEURONTIN) 600 MG tablet Take 2 tablets (1,200 mg total) by mouth 3 (three) times daily.   ibuprofen (ADVIL) 200 MG tablet Take 400 mg by mouth every 6 (six) hours as needed for headache or moderate pain.   Magnesium 250 MG TABS Take 1 tablet (250 mg total) by mouth at bedtime. (Patient taking differently: Take 250 mg by mouth at bedtime.)   metoprolol tartrate (LOPRESSOR) 25 MG tablet TAKE ONE-HALF TABLET BY  MOUTH TWICE DAILY , MAY  TAKE AN ADDITIONAL 1 TABLET IF NEEDED FOR PALPITATIONS   Multiple Vitamin (MULTIVITAMIN WITH MINERALS) TABS tablet Take 1 tablet by mouth daily.   Omega-3 Fatty  Acids (FISH OIL) 1000 MG CAPS Take 1,000 mg by mouth daily.    omeprazole (PRILOSEC) 40 MG capsule Take 1 capsule (40 mg total) by mouth 2 (two) times daily before a meal.   rosuvastatin (CRESTOR) 40 MG tablet Take 1 tablet (40 mg total) by mouth daily.   sildenafil (VIAGRA) 100 MG tablet TAKE 1/2 TO 1 TABLET BY MOUTH DAILY AS NEEDED FOR ERECTILE DYSFUNCTION (Patient taking differently: Take 50-100 mg  by mouth daily as needed for erectile dysfunction.)   vitamin B-12 (CYANOCOBALAMIN) 1000 MCG tablet Take 1,000 mcg by mouth daily.   vitamin E 400 UNIT capsule Take 400 Units by mouth daily.   No facility-administered encounter medications on file as of 08/01/2021.   No Known Allergies Patient Active Problem List   Diagnosis Date Noted   Bloating 07/18/2021   Biliary stricture 07/18/2021   Ascending cholangitis    Common bile duct (CBD) obstruction 03/31/2021   Skin rash 10/19/2020   Dyspnea on exertion 07/26/2020   Postural dizziness 07/18/2019   Health maintenance examination 11/29/2018   Change in bowel habits 08/18/2018   Duodenal stenosis 05/06/2018   S/P ERCP 05/06/2018   Constipation 05/06/2018   Choledocholithiasis 05/06/2018   Interstitial lung disease ([place]) 01/22/2018   Calculus of bile duct with acute cholangitis with obstruction    GERD (gastroesophageal reflux disease) 11/13/2017   CAD S/P percutaneous coronary angioplasty    PAF (paroxysmal atrial fibrillation) ([place]); CHA2DS2VASc = 4--> Eliquis.    Ischemic cardiomyopathy    NSTEMI (non-ST elevated myocardial infarction) ([place]) 04/27/2015   Essential hypertension    Advanced care planning/counseling discussion 07/12/2014   Obesity, Class I, BMI 30-34.9 07/12/2014   Scapular dysfunction 06/12/2014   Medicare annual wellness visit, subsequent 06/29/2013   Vitamin D deficiency 03/29/2013   Fatigue 02/27/2013   Prediabetes 05/09/2012   Male sexual dysfunction 05/03/2012   Post-polio syndrome    Chronic pain syndrome    OSA (obstructive sleep apnea)    Migraine    Hyperlipidemia with target LDL less than 70    Social History   Socioeconomic History   Marital status: Married    Spouse name: Alexander [name]   Number of children: 3   Years of education: Not on file   Highest education level: Not on file  Occupational History   Occupation: retired  Tobacco Use   Smoking status: Former    Types: Cigarettes    Quit  date: 1972    Years since quitting: 51.2   Smokeless tobacco: Never   Tobacco comments:    was very light  Vaping Use   Vaping Use: Never used  Substance and Sexual Activity   Alcohol use: Not Currently    Comment: ocassional -maybe 2 beers   Drug use: No   Sexual activity: Yes  Other Topics Concern   Not on file  Social History Narrative   Caffeine: 1 cup soda, occasional coffee   Lives with wife [name]) and 49 yo son, 4 dogs   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 Vensel family history includes Breast cancer (age of onset: 48) in his brother; Cancer in his maternal uncle; Cancer (age of onset: 40) in his sister; Diabetes in his mother; Heart disease in his mother; Hypertension in his maternal aunt.      Objective:    Vitals:   08/01/21 1008  BP: 120/70  Pulse: (!) 57    Physical Exam Well-developed well-nourished  in no acute distress.  Height, Weight, 220 BMI 30.68 companied by his wife.  Patient ambulates with braces  HEENT; nontraumatic normocephalic, EOMI, PE R LA, sclera anicteric. Oropharynx; not examined today Neck; supple, no JVD Cardiovascular; regular rate and rhythm with S1-S2, no murmur rub or gallop Pulmonary; Clear bilaterally Abdomen; soft,  nondistended, no definite succussion splash, nontender ,no palpable mass or hepatosplenomegaly, bowel sounds are active Rectal; not done today Skin; benign exam, no jaundice rash or appreciable lesions Extremities; lower extremity braces present, postpolio syndrome Neuro/Psych; alert and  oriented x4, grossly nonfocal mood and affect appropriate        Assessment & Plan:   #85 71 year old male with postprandial upper abdominal discomfort, fullness, without acute pain, no nausea or vomiting, no fever or chills.  Patient is status post EUS on 07/24/2020 with finding of 1 definite common bile duct stone and 1 possible common bile duct stone, patent sphincterotomy, no ampullary mass, diffuse pancreatic echogenicity, normal pancreatic duct.  Also found to have a benign appearing duodenal stricture which was balloon dilated from 12 to 15 mm and then 15 to 18 mm.  Labs done yesterday 07/31/2021 are reassuring with normal CBC and normal LFTs.  No evidence for infection/sepsis/cholangitis.  I think his symptoms are secondary to persistent duodenal stricture, and low-grade outlet obstructive type symptoms.  #2 prior history of choledocholithiasis and cholangitis #3 status postcholecystectomy #4 history of adenomatous colon polyps-up-to-date with colonoscopy #5 interstitial lung disease #6.  History of atrial fibrillation #7.  Coronary artery disease #8.  Hypertension #9.  History of ischemic cardiomyopathy most recent EF 55% #10 postpolio syndrome  Plan; continue omeprazole 40 mg p.o. twice daily We discussed eating small meals, low residue/step 3 gastroparesis type diet Patient is currently scheduled for repeat ERCP with stone extraction, and Dr. Rush Landmark   plans to do repeat duodenal dilation at that time on 09/15/2021-Will investigate possibility of having his procedure moved up.  Patient and wife advised to call should he have any change in his status, increasing abdominal pain, nausea vomiting fever etc. In the interim  Alfredia Ferguson PA-C 08/01/2021   Cc: [name], MD

## 2021-08-01 NOTE — Patient Instructions (Addendum)
If you are age 72 or older, your body mass index should be between 23-30. Your Body mass index is 30.68 kg/m?Marland Kitchen If this is out of the aforementioned range listed, please consider follow up with your Primary Care Provider. ?________________________________________________________ ? ?The [facility] providers would like to encourage you to use [facility] to communicate with providers for non-urgent requests or questions.  Due to long hold times on the telephone, sending your provider a message by [place] may be a faster and more efficient way to get a response.  Please allow 48 business hours for a response.  Please remember that this is for non-urgent requests.  ?_______________________________________________________ ? ?Stay on Omeprazole 40 mg twice daily ? ?Follow a  bland diet with low roughage. ? ?Amy will discuss today's visit with Dr. Rush Landmark. ? ?Call the office and speak with Beth or Patty if you symptoms worsening symptoms fever, nausea and vomiting, etc. ? ?Thank you for entrusting me with your care and choosing [facility]. ? ?Nicoletta Ba, PA-C ?

## 2021-08-03 NOTE — Progress Notes (Signed)
Attending Physician's Attestation   I have reviewed the chart.   I agree with the Advanced Practitioner's note, impression, and recommendations with any updates as below.    Rigo Letts Mansouraty, MD [facility] Office # [phone]  

## 2021-08-26 ENCOUNTER — Telehealth: Payer: Self-pay | Admitting: Family Medicine

## 2021-08-26 DIAGNOSIS — G14 Postpolio syndrome: Secondary | ICD-10-CM

## 2021-08-26 DIAGNOSIS — Z7189 Other specified counseling: Secondary | ICD-10-CM

## 2021-08-26 NOTE — Telephone Encounter (Signed)
Pt wife dropped off paperwork ? ? ?Type of forms received:disability parking ? ?Routed UO:RVIF ? ?Paperwork received by : terrill ? ? ?Individual made aware of 3-5 business day turn around (Y/N):y ? ?Form completed and patient made aware of charges(Y/N):y ? ? ?Faxed to :  ? ?Form location:  dr Darnell Level box ?

## 2021-08-26 NOTE — Assessment & Plan Note (Signed)
Advanced directive: scanned 07/2021. Wife [name] then son Alroy Dust are [place]. Doesn't want prolonged life support if terminal condition. Doesn't think would want tube feed. Wants living will followed.  ?

## 2021-08-26 NOTE — Telephone Encounter (Signed)
Signed and in Lisa's box.  ?

## 2021-08-26 NOTE — Telephone Encounter (Signed)
Placed form into Dr. Synthia Innocent box.  ?

## 2021-08-27 NOTE — Telephone Encounter (Signed)
Left message on vm per dpr notifying pt the parking placard form is ready to pick up. ? ?[Placed form at front office.] ?

## 2021-09-10 ENCOUNTER — Encounter (HOSPITAL_COMMUNITY): Payer: Self-pay | Admitting: Gastroenterology

## 2021-09-10 NOTE — Progress Notes (Signed)
Attempted to obtain medical history via telephone, unable to reach at this time. I left a voicemail to return pre surgical testing department's phone call.  

## 2021-09-15 ENCOUNTER — Ambulatory Visit (HOSPITAL_COMMUNITY): Payer: Medicare Other

## 2021-09-15 ENCOUNTER — Encounter (HOSPITAL_COMMUNITY): Payer: Self-pay | Admitting: Gastroenterology

## 2021-09-15 ENCOUNTER — Ambulatory Visit (HOSPITAL_BASED_OUTPATIENT_CLINIC_OR_DEPARTMENT_OTHER): Payer: Medicare Other | Admitting: Certified Registered Nurse Anesthetist

## 2021-09-15 ENCOUNTER — Ambulatory Visit (HOSPITAL_COMMUNITY)
Admission: RE | Admit: 2021-09-15 | Discharge: 2021-09-15 | Disposition: A | Discharge status: Home / Self Care | Payer: Medicare Other | Attending: Gastroenterology | Admitting: Gastroenterology

## 2021-09-15 ENCOUNTER — Other Ambulatory Visit: Payer: Self-pay

## 2021-09-15 ENCOUNTER — Encounter (HOSPITAL_COMMUNITY)
Admission: RE | Disposition: A | Discharge status: Home / Self Care | Payer: Self-pay | Source: Home / Self Care | Attending: Gastroenterology

## 2021-09-15 ENCOUNTER — Ambulatory Visit (HOSPITAL_COMMUNITY): Payer: Medicare Other | Admitting: Certified Registered Nurse Anesthetist

## 2021-09-15 DIAGNOSIS — K8050 Calculus of bile duct without cholangitis or cholecystitis without obstruction: Secondary | ICD-10-CM

## 2021-09-15 DIAGNOSIS — K315 Obstruction of duodenum: Secondary | ICD-10-CM | POA: Insufficient documentation

## 2021-09-15 DIAGNOSIS — K838 Other specified diseases of biliary tract: Secondary | ICD-10-CM

## 2021-09-15 DIAGNOSIS — K219 Gastro-esophageal reflux disease without esophagitis: Secondary | ICD-10-CM | POA: Diagnosis not present

## 2021-09-15 DIAGNOSIS — I2510 Atherosclerotic heart disease of native coronary artery without angina pectoris: Secondary | ICD-10-CM | POA: Diagnosis not present

## 2021-09-15 DIAGNOSIS — I4891 Unspecified atrial fibrillation: Secondary | ICD-10-CM | POA: Insufficient documentation

## 2021-09-15 DIAGNOSIS — Z87891 Personal history of nicotine dependence: Secondary | ICD-10-CM | POA: Diagnosis not present

## 2021-09-15 DIAGNOSIS — K831 Obstruction of bile duct: Secondary | ICD-10-CM

## 2021-09-15 DIAGNOSIS — K3189 Other diseases of stomach and duodenum: Secondary | ICD-10-CM

## 2021-09-15 DIAGNOSIS — I10 Essential (primary) hypertension: Secondary | ICD-10-CM | POA: Insufficient documentation

## 2021-09-15 DIAGNOSIS — K8051 Calculus of bile duct without cholangitis or cholecystitis with obstruction: Secondary | ICD-10-CM | POA: Diagnosis not present

## 2021-09-15 DIAGNOSIS — G4730 Sleep apnea, unspecified: Secondary | ICD-10-CM | POA: Diagnosis not present

## 2021-09-15 DIAGNOSIS — Z955 Presence of coronary angioplasty implant and graft: Secondary | ICD-10-CM | POA: Insufficient documentation

## 2021-09-15 DIAGNOSIS — I252 Old myocardial infarction: Secondary | ICD-10-CM | POA: Insufficient documentation

## 2021-09-15 DIAGNOSIS — K828 Other specified diseases of gallbladder: Secondary | ICD-10-CM | POA: Diagnosis not present

## 2021-09-15 HISTORY — PX: ENDOSCOPIC RETROGRADE CHOLANGIOPANCREATOGRAPHY (ERCP) WITH PROPOFOL: SHX5810

## 2021-09-15 HISTORY — PX: BILIARY DILATION: SHX6850

## 2021-09-15 HISTORY — PX: REMOVAL OF STONES: SHX5545

## 2021-09-15 SURGERY — ENDOSCOPIC RETROGRADE CHOLANGIOPANCREATOGRAPHY (ERCP) WITH PROPOFOL
Anesthesia: General

## 2021-09-15 MED ORDER — FENTANYL CITRATE (PF) 100 MCG/2ML IJ SOLN
INTRAMUSCULAR | Status: DC | PRN
Start: 2021-09-15 — End: 2021-09-15
  Administered 2021-09-15: 50 ug via INTRAVENOUS
  Administered 2021-09-15 (×2): 25 ug via INTRAVENOUS

## 2021-09-15 MED ORDER — ROCURONIUM BROMIDE 10 MG/ML (PF) SYRINGE
PREFILLED_SYRINGE | INTRAVENOUS | Status: DC | PRN
  Administered 2021-09-15: 20 mg via INTRAVENOUS
  Administered 2021-09-15: 50 mg via INTRAVENOUS

## 2021-09-15 MED ORDER — SODIUM CHLORIDE 0.9 % IV SOLN
INTRAVENOUS | Status: DC | PRN
  Administered 2021-09-15: 50 mL

## 2021-09-15 MED ORDER — PROPOFOL 10 MG/ML IV BOLUS
INTRAVENOUS | Status: AC
  Filled 2021-09-15: qty 20

## 2021-09-15 MED ORDER — LACTATED RINGERS IV SOLN: INTRAVENOUS | Status: DC

## 2021-09-15 MED ORDER — CIPROFLOXACIN HCL 500 MG PO TABS: 500.0000 mg | ORAL_TABLET | Freq: Two times a day (BID) | ORAL | 0 refills | Status: AC

## 2021-09-15 MED ORDER — DEXAMETHASONE SODIUM PHOSPHATE 10 MG/ML IJ SOLN
INTRAMUSCULAR | Status: DC | PRN
  Administered 2021-09-15: 4 mg via INTRAVENOUS

## 2021-09-15 MED ORDER — GLUCAGON HCL RDNA (DIAGNOSTIC) 1 MG IJ SOLR
INTRAMUSCULAR | Status: AC
  Filled 2021-09-15: qty 2

## 2021-09-15 MED ORDER — EPHEDRINE SULFATE-NACL 50-0.9 MG/10ML-% IV SOSY
PREFILLED_SYRINGE | INTRAVENOUS | Status: DC | PRN
  Administered 2021-09-15: 15 mg via INTRAVENOUS
  Administered 2021-09-15: 10 mg via INTRAVENOUS

## 2021-09-15 MED ORDER — LIDOCAINE 2% (20 MG/ML) 5 ML SYRINGE
INTRAMUSCULAR | Status: DC | PRN
Start: 2021-09-15 — End: 2021-09-15
  Administered 2021-09-15: 80 mg via INTRAVENOUS

## 2021-09-15 MED ORDER — CIPROFLOXACIN IN D5W 400 MG/200ML IV SOLN
INTRAVENOUS | Status: AC
  Filled 2021-09-15: qty 200

## 2021-09-15 MED ORDER — CIPROFLOXACIN IN D5W 400 MG/200ML IV SOLN
INTRAVENOUS | Status: DC | PRN
  Administered 2021-09-15: 400 mg via INTRAVENOUS

## 2021-09-15 MED ORDER — FENTANYL CITRATE (PF) 100 MCG/2ML IJ SOLN
INTRAMUSCULAR | Status: AC
  Filled 2021-09-15: qty 2

## 2021-09-15 MED ORDER — INDOMETHACIN 50 MG RE SUPP
RECTAL | Status: DC | PRN
  Administered 2021-09-15: 100 mg via RECTAL

## 2021-09-15 MED ORDER — ONDANSETRON HCL 4 MG/2ML IJ SOLN
INTRAMUSCULAR | Status: DC | PRN
  Administered 2021-09-15: 4 mg via INTRAVENOUS

## 2021-09-15 MED ORDER — PHENYLEPHRINE HCL-NACL 20-0.9 MG/250ML-% IV SOLN
INTRAVENOUS | Status: DC | PRN
  Administered 2021-09-15: 40 ug/min via INTRAVENOUS

## 2021-09-15 MED ORDER — SODIUM CHLORIDE 0.9 % IV SOLN: INTRAVENOUS | Status: DC

## 2021-09-15 MED ORDER — INDOMETHACIN 50 MG RE SUPP
RECTAL | Status: AC
  Filled 2021-09-15: qty 2

## 2021-09-15 MED ORDER — SUGAMMADEX SODIUM 500 MG/5ML IV SOLN
INTRAVENOUS | Status: DC | PRN
  Administered 2021-09-15: 300 mg via INTRAVENOUS

## 2021-09-15 MED ORDER — GLUCAGON HCL RDNA (DIAGNOSTIC) 1 MG IJ SOLR
INTRAMUSCULAR | Status: DC | PRN
  Administered 2021-09-15: .25 mg via INTRAVENOUS

## 2021-09-15 MED ORDER — PROPOFOL 10 MG/ML IV BOLUS
INTRAVENOUS | Status: DC | PRN
Start: 2021-09-15 — End: 2021-09-15
  Administered 2021-09-15: 130 mg via INTRAVENOUS
  Administered 2021-09-15: 20 mg via INTRAVENOUS

## 2021-09-15 NOTE — Anesthesia Postprocedure Evaluation (Signed)
Anesthesia Post Note ? ?Patient: Alexander [name] ? ?Procedure(s) Performed: ENDOSCOPIC RETROGRADE CHOLANGIOPANCREATOGRAPHY (ERCP) WITH PROPOFOL ?REMOVAL OF STONES ?BILIARY DILATION ?Balloon dilation wire-guided ? ?  ? ?Patient location during evaluation: PACU ?Anesthesia Type: General ?Level of consciousness: sedated and patient cooperative ?Pain management: pain level controlled ?Vital Signs Assessment: post-procedure vital signs reviewed and stable ?Respiratory status: spontaneous breathing ?Cardiovascular status: stable ?Anesthetic complications: no ? ? ?No notable events documented. ? ?Last Vitals:  ?Vitals:  ? 09/15/21 1340 09/15/21 1345  ?BP: 117/81   ?Pulse: 68 71  ?Resp: 15 11  ?Temp:    ?SpO2: 95% 94%  ?  ?Last Pain:  ?Vitals:  ? 09/15/21 1307  ?TempSrc: Temporal  ?PainSc: 0-No pain  ? ? ?  ?  ?  ?  ?  ?  ? ?Nolon Nations ? ? ? ? ?

## 2021-09-15 NOTE — Anesthesia Procedure Notes (Addendum)
Procedure Name: Intubation ?Date/Time: 09/15/2021 11:59 AM ?Performed by: West Pugh, CRNA ?Pre-anesthesia Checklist: Patient identified, Emergency Drugs available, Suction available, Patient being monitored and Timeout performed ?Patient Re-evaluated:Patient Re-evaluated prior to induction ?Oxygen Delivery Method: Circle system utilized ?Preoxygenation: Pre-oxygenation with 100% oxygen ?Induction Type: IV induction ?Ventilation: Mask ventilation without difficulty ?Laryngoscope Size: Mac and 4 ?Grade View: Grade I ?Tube type: Oral ?Tube size: 7.5 mm ?Number of attempts: 1 ?Airway Equipment and Method: Stylet ?Placement Confirmation: ETT inserted through vocal cords under direct vision, positive ETCO2, CO2 detector and breath sounds checked- equal and bilateral ?Secured at: 22 cm ?Tube secured with: Tape ?Dental Injury: Teeth and Oropharynx as per pre-operative assessment  ? ? ? ? ?

## 2021-09-15 NOTE — Transfer of Care (Signed)
Immediate Anesthesia Transfer of Care Note ? ?Patient: LENNOX DOLBERRY ? ?Procedure(s) Performed: ENDOSCOPIC RETROGRADE CHOLANGIOPANCREATOGRAPHY (ERCP) WITH PROPOFOL ?REMOVAL OF STONES ?BILIARY DILATION ?Balloon dilation wire-guided ? ?Patient Location: PACU ? ?Anesthesia Type:General ? ?Level of Consciousness: awake, drowsy and patient cooperative ? ?Airway & Oxygen Therapy: Patient Spontanous Breathing and Patient connected to face mask oxygen ? ?Post-op Assessment: Report given to RN and Post -op Vital signs reviewed and stable ? ?Post vital signs: Reviewed and stable ? ?Last Vitals:  ?Vitals Value Taken Time  ?BP 133/87 09/15/21 1305  ?Temp    ?Pulse 71 09/15/21 1308  ?Resp 18 09/15/21 1308  ?SpO2 100 % 09/15/21 1308  ?Vitals shown include unvalidated device data. ? ?Last Pain:  ?Vitals:  ? 09/15/21 0846  ?TempSrc: Oral  ?PainSc: 0-No pain  ?   ? ?  ? ?Complications: No notable events documented. ?

## 2021-09-15 NOTE — H&P (Signed)
? ?GASTROENTEROLOGY PROCEDURE H&P NOTE  ? ?Primary Care Physician: ?[name], MD ? ?HPI: ?Alexander [name] is a 71 y.o. male who presents for ERCP for choledocholithiasis removal (found on recent EUS) and Duodenal stricture dilation. ? ?Past Medical History:  ?Diagnosis Date  ? Acute cholecystitis 03/2016  ? with sepsis  ? Arthritis   ? CAD S/P percutaneous coronary angioplasty 1995, 2000, 2005  ? a. 1995 s/p BMS;  b. 2000 s/p BMS;  c. 2005 s/p stent - All stents in [facility] (RCA, LAD & OM - unknown on which date);  d. 04/2015 NSTEMI/Cath: LM nl, ost/p LAD 20%, patent mLAD stent, RI small, OM2 patent stent, pRCA 20%, patent stent, EF 45-50%-->Med Rx.  ? Calculus of bile duct with acute cholangitis with obstruction   ? Chronic pain syndrome   ? a. Followed @ [facility];  b. Uses 12-14 excedrins per day.  ? Complication of anesthesia 04/01/2021  ? agiated- "battling nurses"  ? Duodenal stricture   ? Essential hypertension   ? Fatty liver   ? Gallstones   ? GERD (gastroesophageal reflux disease)   ? History of depression   ? History of post poliomyelitis muscular atrophy   ? HLD (hyperlipidemia)   ? Ischemic cardiomyopathy   ? a. 04/2015 Echo: EF 40-45%, mod antsept, ant, antlat, apical HK, mild AI/MR, PASP 71mHg.  ? Migraines   ? Neuropathy   ? due to post polio syndrome  ? OSA (obstructive sleep apnea)   ? does not want to use CPAP, using mouth guard  ? PA[place]) [place]   ? a. 04/2015 in setting of cholecystits and sepsis -->[place];  b. CHA2DS2VASc = 4--> Eliquis.  ? Pneumonia 04/2015  ? Post-polio syndrome   ? a. ambulates with braces.  ? Septic shock ([place] 01/07/2018  ? Shoulder pain, right   ? ?Past Surgical History:  ?Procedure Laterality Date  ? BALLOON DILATION N/A 06/06/2018  ? Procedure: BALLOON DILATION;  Surgeon: MIrving Copas, MD;  [place]  Service: Gastroenterology;  Laterality: N/A;  ? BALLOON DILATION N/A 07/06/2018  ? Procedure: BALLOON  DILATION;  Surgeon: MIrving Copas, MD;  [place]  Service: Gastroenterology;  Laterality: N/A;  ? BALLOON DILATION N/A 07/24/2021  ? Procedure: BALLOON DILATION;  Surgeon: MIrving Copas, MD;  [place]  Service: Gastroenterology;  Laterality: N/A;  ? BILIARY BRUSHING  06/05/2021  ? Procedure: BILIARY BRUSHING;  Surgeon: MRush LandmarkGTelford Nab, MD;  [place]  Service: Gastroenterology;;  ? BILIARY DILATION  06/05/2021  ? Procedure: BILIARY DILATION;  Surgeon: MRush LandmarkGTelford Nab, MD;  [place]  Service: Gastroenterology;;  ? BILIARY STENT PLACEMENT  01/08/2018  ? Procedure: BILIARY STENT PLACEMENT;  Surgeon: GJackquline Denmark MD;  [facility]  Service: Endoscopy;;  ? BILIARY STENT PLACEMENT N/A 02/09/2018  ? Procedure: BILIARY STENT PLACEMENT;  Surgeon: MRush LandmarkGTelford Nab, MD;  [facility];  Service: Gastroenterology;  Laterality: N/A;  ? BILIARY STENT PLACEMENT  04/01/2021  ? Procedure: BILIARY STENT PLACEMENT;  Surgeon: GJackquline Denmark MD;  [facility];  Service: Endoscopy;;  ? BIOPSY  06/06/2018  ? Procedure: BIOPSY;  Surgeon: MIrving Copas, MD;  [place]  Service: Gastroenterology;;  ? BIOPSY  04/01/2021  ? Procedure: BIOPSY;  Surgeon: GJackquline Denmark MD;  [facility];  Service: Endoscopy;;  ? BIOPSY  06/05/2021  ? Procedure: BIOPSY;  Surgeon: MRush LandmarkGTelford Nab, MD;  [place]  Service: Gastroenterology;;  ? CARDIAC CATHETERIZATION N/A 04/30/2015  ? Procedure: Left Heart Cath and Coronary Angiography;  Surgeon: Troy Sine, MD;  Location: [place] [facility];  Service: Cardiovascular; LM nl, ost/p LAD 20%, patent mLAD stent, RI small, OM2 patent stent, pRCA 20%, patent stent, EF 45-50%-->Med Rx   ? cervical spine stimulator  08/2012  ? CHOLECYSTECTOMY  06/10/2015  ? Procedure: LAPAROSCOPIC CHOLECYSTECTOMY;  Surgeon: Mickeal Skinner, MD;  Location: [place];  Service: General;;  ? COLONOSCOPY  09/2013  ? TA x2, rpt 5 yrs ([place])  ? COLONOSCOPY WITH ESOPHAGOGASTRODUODENOSCOPY (EGD)  06/2018  ? reactive gastropathy, tubular adenoma, rpt colonoscopy 5 yrs ([place]ans[place])  ? COLONOSCOPY WITH PROPOFOL N/A 06/06/2018  ? Procedure: COLONOSCOPY WITH PROPOFOL;  Surgeon: Mansouraty, Telford Nab., MD;  Location: [place];  Service: Gastroenterology;  Laterality: N/A;  ? CORONARY ANGIOPLASTY WITH STENT PLACEMENT  1995, 2000, 2005  ? stents in RCA, LAD & Cx-OM. -Stents were patent by cardiac catheterization in November 2016  ? ENDOSCOPIC RETROGRADE CHOLANGIOPANCREATOGRAPHY (ERCP) WITH PROPOFOL N/A 02/09/2018  ? Procedure: ENDOSCOPIC RETROGRADE CHOLANGIOPANCREATOGRAPHY (ERCP) WITH PROPOFOL;  Surgeon: Rush Landmark Telford Nab., MD;  [facility];  Service: Gastroenterology;  Laterality: N/A;  ? ENDOSCOPIC RETROGRADE CHOLANGIOPANCREATOGRAPHY (ERCP) WITH PROPOFOL N/A 03/30/2018  ? Procedure: ENDOSCOPIC RETROGRADE CHOLANGIOPANCREATOGRAPHY (ERCP) WITH PROPOFOL;  Surgeon: Rush Landmark Telford Nab., MD;  Location[facility];  Service: Gastroenterology;  Laterality: N/A;  ? ENDOSCOPIC RETROGRADE CHOLANGIOPANCREATOGRAPHY (ERCP) WITH PROPOFOL N/A 06/05/2021  ? Procedure: ENDOSCOPIC RETROGRADE CHOLANGIOPANCREATOGRAPHY (ERCP) WITH PROPOFOL;  Surgeon: Rush Landmark Telford Nab., MD;  Location: [place];  Service: Gastroenterology;  Laterality: N/A;  ? ERCP N/A 01/08/2018  ? Procedure: ENDOSCOPIC RETROGRADE CHOLANGIOPANCREATOGRAPHY (ERCP);  Surgeon: Jackquline Denmark, MD;  [facility];  Service: Endoscopy;  Laterality: N/A;  ? ERCP N/A 04/01/2021  ? Procedure: ENDOSCOPIC RETROGRADE CHOLANGIOPANCREATOGRAPHY (ERCP);  Surgeon: Jackquline Denmark, MD;  Location: [facility]COPY;  Service: Endoscopy;  Laterality: N/A;  Please schedule after 1 PM  ? ESOPHAGOGASTRODUODENOSCOPY N/A 03/30/2018  ? Procedure: ESOPHAGOGASTRODUODENOSCOPY (EGD);  Surgeon: Irving Copas., MD;  [facility];  Service: Gastroenterology;  Laterality: N/A;  ? ESOPHAGOGASTRODUODENOSCOPY (EGD) WITH PROPOFOL N/A 06/06/2018  ? Procedure: ESOPHAGOGASTRODUODENOSCOPY (EGD) WITH PROPOFOL;  Surgeon: Rush Landmark Telford Nab., MD;  Location: [place];  Service: Gastroenterology;  Laterality: N/A;  ? ESOPHAGOGASTRODUODENOSCOPY (EGD) WITH PROPOFOL N/A 07/06/2018  ? Procedure: ESOPHAGOGASTRODUODENOSCOPY (EGD) WITH PROPOFOL;  Surgeon: Rush Landmark Telford Nab., MD;  Location: [place];  Service: Gastroenterology;  Laterality: N/A;  ? ESOPHAGOGASTRODUODENOSCOPY (EGD) WITH PROPOFOL N/A 07/24/2021  ? Procedure: ESOPHAGOGASTRODUODENOSCOPY (EGD) WITH PROPOFOL WITH FLUOROSCOPY;  Surgeon: Mansouraty, Telford Nab., MD;  Location: [place];  Service: Gastroenterology;  Laterality: N/A;  ? EUS N/A 07/24/2021  ? Procedure: UPPER ENDOSCOPIC ULTRASOUND (EUS) RADIAL;  Surgeon: Rush Landmark Telford Nab., MD;  Location: [place];  Service: Gastroenterology;  Laterality: N/A;  ? lumbar spine stimulator    ? POLYPECTOMY  06/06/2018  ? Procedure: POLYPECTOMY;  Surgeon: Rush Landmark Telford Nab., MD;  Location: [place];  Service: Gastroenterology;;  ? REMOVAL OF STONES  03/30/2018  ? Procedure: REMOVAL OF STONES;  Surgeon: Rush Landmark Telford Nab., MD;  [facility];  Service: Gastroenterology;;  ? REMOVAL OF STONES  06/05/2021  ? Procedure: REMOVAL OF STONES;  Surgeon: Rush Landmark Telford Nab., MD;  Location: [place];  Service: Gastroenterology;;  ? SPHINCTEROTOMY  01/08/2018  ? Procedure: SPHINCTEROTOMY;  Surgeon: Jackquline Denmark, MD;  Location[facility] END[facility]COPY;  Service: Endoscopy;;  ? SPHINCTEROTOMY  02/09/2018  ? Procedure: SPHINCTEROTOMY;  Surgeon: Irving Copas., MD;  [facility];  Service: Gastroenterology;;  ? Bess Kinds CHOLANGIOSCOPY N/A 06/05/2021  ? Procedure: SPYGLASS CHOLANGIOSCOPY;  Surgeon: Irving Copas., MD;  Location: [place];  Service: Gastroenterology;  Laterality: N/A;  ? STENT REMOVAL   02/09/2018  ? Procedure: STENT REMOVAL;  Surgeon: Irving Copas., MD;  [facility];  Service: Gastroenterology;;  ? [place]  03/30/2018  ? Procedure: STENT REMOVAL;  Surgeon: Luetta Nutting

## 2021-09-15 NOTE — Op Note (Signed)
[facility] ?Patient Name: Alexander [name] ?Procedure Date: 09/15/2021 ?MRN: [id number] ?Attending MD: Justice Britain , MD ?Date of Birth: [birth year] ?CSN: [id number] ?Age: 71 ?Admit Type: Outpatient ?Procedure:                ERCP ?Indications:              Bile duct stone(s), For dilation and stenting of  ?                          duodenal stenosis ?Providers:                Justice Britain, MD, Jeanella Cara, RN,  ?                          Burtis Junes, RN, Cletis Athens, Technician, Cindee Salt  ?                          Teaching laboratory technician ?Referring MD:             [name] ?Medicines:                General Anesthesia, Cipro 400 mg IV, Indomethacin  ?                          100 mg PR, Glucagon 0.25 mg IV ?Complications:            No immediate complications. ?Estimated Blood Loss:     Estimated blood loss was minimal. ?Procedure:                Pre-Anesthesia Assessment: ?                          - Prior to the procedure, a History and Physical  ?                          was performed, and patient medications and  ?                          allergies were reviewed. [name] tolerance of  ?                          previous anesthesia was also reviewed. The risks  ?                          and benefits of the procedure and the sedation  ?                          options and risks were discussed with the patient.  ?                          All questions were answered, and informed consent  ?                          was obtained. Prior Anticoagulants: The patient has  ?  taken no previous anticoagulant or antiplatelet  ?                          agents except for NSAID medication. ASA Grade  ?                          Assessment: III - A patient with severe systemic  ?                          disease. After reviewing the risks and benefits,  ?                          the patient was deemed in satisfactory condition to  ?                           undergo the procedure. ?                          After obtaining informed consent, the scope was  ?                          passed under direct vision. Throughout the  ?                          procedure, the patient's blood pressure, pulse, and  ?                          oxygen saturations were monitored continuously. The  ?                          [id number]-[id number] ([id number] Olympus therapeutic endoscope  ?                          was introduced through the mouth, and used to  ?                          inject contrast into and used to locate the major  ?                          papilla. The [id number]-[id number] ([id number] Olympus  ?                          duodenoscope was introduced through the mouth, and  ?                          used to inject contrast into and used to inject  ?                          contrast into the bile duct. The ERCP was  ?                          accomplished without difficulty. The patient  ?  tolerated the procedure. ?Scope In: ?Scope Out: ?Findings: ?     A scout film of the abdomen was obtained. Surgical clips, consistent  ?     with a previous cholecystectomy, were seen in the area of the right  ?     upper quadrant of the abdomen. ?     A therapeutic esophagogastroduodenoscopy scope was used for the  ?     examination of the upper gastrointestinal tract. The scope was passed  ?     under direct vision through the upper GI tract. No gross lesions were  ?     noted in the entire esophagus. Striped moderately erythematous mucosa  ?     was found in the entire examined stomach - previously biopsied and  ?     negative for HP within last few months. Significant J-shaped stomach  ?     that required left-lateral positioning to intubate the duodenum. An  ?     acquired benign-appearing, intrinsic moderate stenosis was found in the  ?     second portion of the duodenum just proximal to the ampulla and was  ?     traversed. A TTS dilator was passed through the scope.  Dilation with a  ?     15-16.5-18 mm and an 18-19-20 mm pyloric balloon dilator was performed  ?     under fluoroscopic guidance in the second portion of the duodenum (up to  ?     19 mm). A mucosal circumferential wrent was noted, but deep tear not  ?     noted. The endoscope withdrawn from the patient. ?     Using the duodenoscope, we noted that a biliary sphincterotomy had been  ?     performed. The sphincterotomy appeared open. ?     A short 0.035 inch Soft Jagwire was passed into the biliary tree. The  ?     Hydratome sphincterotome was passed over the guidewire and the bile duct  ?     was then deeply cannulated. Contrast was injected. I personally  ?     interpreted the bile duct images. Ductal flow of contrast was adequate.  ?     Image quality was adequate. Contrast extended to the hepatic ducts.  ?     Opacification of the entire biliary tree except for the gallbladder was  ?     successful. The main bile duct was severely dilated. The largest  ?     diameter was 18 mm. Dilation of the distal CBD with an 01-07-09 mm balloon  ?     (to a maximum balloon size of 10 mm) dilator was successful as a  ?     sphincteroplasty. The biliary tree was swept with a retrieval balloon  ?     (15-18 mm) starting at the distal duct and moving towards the  ?     bifurcation sequentially. Sludge was swept from the duct. An occlusion  ?     cholangiogram was performed that showed no further significant biliary  ?     pathology. A pancreatogram was not performed. ?     The duodenoscope was withdrawn from the patient. ?Impression:               - No gross lesions in esophagus. Erythematous  ?  mucosa in the stomach. ?                          - Acquired duodenal stenosis at D2 (just proximal  ?                          to the ampulla) - dilated to 19 mm with mucosal  ?                          wrent noted. ?                          - Prior biliary sphincterotomy appeared open. ?                          -  The entire main bile duct was severely dilated. ?                          - Sphincteroplasty performed. ?                          - The biliary tree was swept and sludge was found  ?                          but no evidence of stone disease present (the  ?                          previous stone on EUS had already fallen out). ?Moderate Sedation: ?     Not Applicable - Patient had care per Anesthesia. ?Recommendation:           - The patient will be observed post-procedure,  ?                          until all discharge criteria are met. ?                          - Discharge patient to home. ?                          - Observe patient's clinical course. ?                          - Ciprofloxacin 500 mg twice daily for 3 days to  ?                          decrease post-interventional infectious  ?                          complications. ?                          - Continue current PPI dosing. ?                          - Return to GI clinic at appointment to be  ?  scheduled. ?                          - Watch for pancreatitis, bleeding, perforation,  ?                          and cholangitis. ?                          - The findings and recommendations were discussed  ?                          with the patient. ?                          - The findings and recommendations were discussed  ?                          with the patient's family. ?Procedure Code(s):        --- Professional --- ?                          [phone], Endoscopic retrograde  ?                          cholangiopancreatography (ERCP); with removal of  ?                          calculi/debris from biliary/pancreatic duct(s) ?                          81275, Esophagogastroduodenoscopy, flexible,  ?                          transoral; with dilation of gastric/duodenal  ?                          stricture(s) (eg, balloon, bougie) ?Diagnosis Code(s):        --- Professional --- ?                          K31.89, Other  diseases of stomach and duodenum ?                          K31.5, Obstruction of duodenum ?                          K80.50, Calculus of bile duct without cholangitis  ?                          or chole

## 2021-09-15 NOTE — Anesthesia Preprocedure Evaluation (Signed)
Anesthesia Evaluation  ?Patient identified by MRN, date of birth, ID band ?Patient awake ? ? ? ?Reviewed: ?Allergy & Precautions, H&P , NPO status , Patient's Chart, lab work & pertinent test results ? ?Airway ?Mallampati: I ? ? ?Neck ROM: full ? ? ? Dental ? ?(+)  ?  ?Pulmonary ?sleep apnea , pneumonia, former smoker,  ?  ?breath sounds clear to auscultation ? ? ? ? ? ? Cardiovascular ?hypertension, + CAD, + Past MI and + Cardiac Stents  ?+ dysrhythmias Atrial Fibrillation  ?Rhythm:regular Rate:Normal ? ?TTE (07/2020): EF 60%, normal valve structure and function. ?  ?Neuro/Psych ? Headaches,   ? GI/Hepatic ?GERD  ,  ?Endo/Other  ? ? Renal/GU ?  ? ?  ?Musculoskeletal ? ?(+) Arthritis ,  ? Abdominal ?  ?Peds ? Hematology ?  ?Anesthesia Other Findings ? ? Reproductive/Obstetrics ? ?  ? ? ? ? ? ? ? ? ? ? ? ? ? ?  ?  ? ? ? ? ? ? ? ? ?Anesthesia Physical ? ?Anesthesia Plan ? ?ASA: 3 ? ?Anesthesia Plan: General  ? ?Post-op Pain Management: Minimal or no pain anticipated  ? ?Induction: Intravenous ? ?PONV Risk Score and Plan: 2 and Ondansetron, Dexamethasone and Treatment may vary due to age or medical condition ? ?Airway Management Planned: Oral ETT ? ?Additional Equipment: None ? ?Intra-op Plan:  ? ?Post-operative Plan: Extubation in OR ? ?Informed Consent: I have reviewed the patients History and Physical, chart, labs and discussed the procedure including the risks, benefits and alternatives for the proposed anesthesia with the patient or authorized representative who has indicated his/her understanding and acceptance.  ? ? ? ?Dental advisory given ? ?Plan Discussed with: CRNA ? ?Anesthesia Plan Comments:   ? ? ? ? ? ? ?Anesthesia Quick Evaluation ? ?

## 2021-09-16 ENCOUNTER — Encounter (HOSPITAL_COMMUNITY): Payer: Self-pay | Admitting: Gastroenterology

## 2021-09-26 ENCOUNTER — Other Ambulatory Visit: Payer: Self-pay | Admitting: Family Medicine

## 2021-09-29 NOTE — Telephone Encounter (Signed)
Refill request Gabapentin ?Last refill 10/18/20 #540/3 ?Last office visit 04/09/21 ?No upcoming appointment scheduled ?

## 2021-09-29 NOTE — Telephone Encounter (Signed)
ERx 

## 2021-10-13 ENCOUNTER — Ambulatory Visit: Payer: Medicare Other | Admitting: Cardiology

## 2021-10-13 ENCOUNTER — Encounter: Payer: Self-pay | Admitting: Cardiology

## 2021-10-13 VITALS — BP 125/75 | HR 67 | Ht 71.0 in | Wt 202.0 lb

## 2021-10-13 DIAGNOSIS — E785 Hyperlipidemia, unspecified: Secondary | ICD-10-CM | POA: Diagnosis not present

## 2021-10-13 DIAGNOSIS — I252 Old myocardial infarction: Secondary | ICD-10-CM

## 2021-10-13 DIAGNOSIS — I10 Essential (primary) hypertension: Secondary | ICD-10-CM

## 2021-10-13 DIAGNOSIS — I2510 Atherosclerotic heart disease of native coronary artery without angina pectoris: Secondary | ICD-10-CM

## 2021-10-13 DIAGNOSIS — J849 Interstitial pulmonary disease, unspecified: Secondary | ICD-10-CM

## 2021-10-13 DIAGNOSIS — Z9861 Coronary angioplasty status: Secondary | ICD-10-CM

## 2021-10-13 DIAGNOSIS — R5383 Other fatigue: Secondary | ICD-10-CM | POA: Diagnosis not present

## 2021-10-13 DIAGNOSIS — I214 Non-ST elevation (NSTEMI) myocardial infarction: Secondary | ICD-10-CM

## 2021-10-13 DIAGNOSIS — R42 Dizziness and giddiness: Secondary | ICD-10-CM

## 2021-10-13 DIAGNOSIS — I480 Paroxysmal atrial fibrillation: Secondary | ICD-10-CM

## 2021-10-13 NOTE — Assessment & Plan Note (Addendum)
Elevated Troponin in setting of Sepsis -- with Afib.   Has never had ACS/MI - only Unstable Angina.   EF was somewhat reduced in the setting of sepsis and troponin elevation suggesting a component of stress-induced cardiomyopathy.  EF now back to normal with no wall motion abnormalities.

## 2021-10-13 NOTE — Patient Instructions (Signed)

## 2021-10-13 NOTE — Progress Notes (Signed)
Primary Care Provider: [name], MD Cardiologist: Glenetta Hew, MD Electrophysiologist: None  Clinic Note: Chief Complaint  Patient presents with   Follow-up    Delayed annual follow-up.  Notably improved fatigue since stopping beta-blocker.   Coronary Artery Disease    No angina   ===================================  ASSESSMENT/PLAN   Problem List Items Addressed This Visit       Cardiology Problems   Hyperlipidemia with target LDL less than 70 (Chronic)    Has been stable on 40 mg rosuvastatin.  Seems to be tolerating it relatively well.  Last lipids were from a year ago demonstrating excellent control.  LDL was 50..    We ordered them to be done this month.-  Addendum: New labs reviewed.  LDL 42 and LDL of 109.  Outstanding control.  If he were to have any more issues with fatigue, would probably back down to 20 mg rosuvastatin.       Relevant Medications   aspirin EC 81 MG tablet   Essential hypertension (Chronic)    Blood pressure is pretty well controlled on no medications anymore.  No longer taking metoprolol.  We stopped his ACE inhibitor last visit.       Relevant Medications   aspirin EC 81 MG tablet   PAF (paroxysmal atrial fibrillation) ([place]); CHA2DS2VASc = 4--> Eliquis. (Chronic)    Has not had any further issues to suspect that he has had A-fib other than when he was sick having cholecystectomy sepsis.  Has been many years without any episodes.  No longer on DOAC.  No longer on rate control agent because of bradycardia and fatigue.       Relevant Medications   aspirin EC 81 MG tablet   Other Relevant Orders   EKG 12-Lead (Completed)   CAD S/P percutaneous coronary angioplasty - Primary (Chronic)    Status post PCI to all 3 major arteries.  Widely patent by cath in 2016.  Has not had any anginal symptoms since then.  Echocardiogram last year showed normal EF with no wall motion abnormalities.  No longer on beta-blocker or ACE inhibitor  because of hypotension and fatigue. He is on aspirin and statin.  Doing well.       Relevant Medications   aspirin EC 81 MG tablet   Other Relevant Orders   EKG 12-Lead (Completed)     Other   Fatigue due to treatment    He does take a long time to recover from hospitalization.  My hope only year ago was to put him on low-dose beta-blocker for cardiovascular benefit and stopping his ACE inhibitor due to orthostatic dizziness and hypotension.  Unfortunately, partly because of all of his hospitalizations and illnesses, he was not able to tolerate even low-dose beta-blocker. Being off the beta-blocker his energy level is notably improved.  [name] has a specific reason to be on beta-blocker, we will continue to hold.       History of myocardial infarction due to demand ischemia (Chronic)    Elevated Troponin in setting of Sepsis -- with Afib.   Has never had ACS/MI - only Unstable Angina.   EF was somewhat reduced in the setting of sepsis and troponin elevation suggesting a component of stress-induced cardiomyopathy.  EF now back to normal with no wall motion abnormalities.       Postural dizziness (Chronic)    Notably improved after stopping ACE inhibitor.  Low-dose beta-blocker eventually discontinued due to fatigue.  Now not on any medications.  Stressed importance of adequate hydration.  His lack of muscle strength in the legs could well lead to venous pooling.  Compression stockings.        ===================================  HPI:    Alexander [name] is a 71 y.o. male with a PMH below who presents today for delayed annual follow-up at the request of Alexander Bush, MD.  PMH: Cardiac  CAD: 1995 s/p BMS;  b. 2000 s/p BMS;  c. 2005 s/p DES- All stents in [facility] (RCA, LAD & OM - unknown on which date);   04/2015: Type II MI in setting of  cholecystitis and sepsis) Cath (in the setting of: LM nl, ost/p LAD 20%, patent mLAD stent, RI small, OM2 patent stent, pRCA 20%,  patent stent,>Med Rx.  (See below) Mild ICM-EF 45 to 50% - resolved; Echo EF 07/2021 55-60% PAROXYSMAL ATRIAL FIBRILLATION - 04/2015-in the setting of cholecystitis and sepsis HTN, HLD Mild OSA-not on CPAP History of Polio with Post-Polio Syndrome/neuropathy- - Bilateral Leg involvement -> walks with ankle to knee braces for support and bilateral hand crutches Chronic Hepatobiliary Disease with recurrent choledocholithiasis /recurrent cholangitis-status postcholecystectomy with several biliary stent placements.   Alexander [name] was last seen on July 26, 2020: He had just recovered from COVID-19, and was still feeling fatigued and worn out.  He been having some issues with blood pressures getting low.  He was noting postural dizziness along with some exertional dyspnea.  He also noted some tachycardia episodes. => More than usual exercise intolerance.  Did not notice any benefit from PRN metoprolol for tachycardia.  He acknowledges that he is not hydrating well. Discontinued lisinopril and started low-dose Lopressor 12.5 mg twice daily, and ordered 2D echo to reassess EF.  Recent Hospitalizations:  Multiple hospitalizations with surgeries 10/31-11/07/2020: Admitted for ascending cholangitis with bile duct obstruction and duodenal stenosis =>  ERCP on 04/01/2021-numerous calculi in the common bile duct-biliary stent placement;  IV antibiotics converted to p.o. Cipro and Flagyl x7 days. Plan was for outpatient ERCP 06/05/2021 06/05/2021: ERCP, Spyglass cholangioscopy; biliary dilation/brushing (biopsy) with removal of biliary sludge and stones, and stent removal; Also noted gastritis-gastric biopsy noted 07/24/2021: EGD-with Upper EUS 09/15/2021: ERCP  Reviewed  CV studies:    The following studies were reviewed today: (if available, images/films reviewed: From Epic Chart or Care Everywhere) 08/23/2020: EF 55 to 60%.  No RWMA.  Mild LVH.  GR 1 DD.  Normal RV size and function with normal RVP.  Normal  mitral and aortic valve.  Interval History:   Alexander [name] returns here today for delayed follow-up.  Follow-up was delayed because of all the GI procedures that were being done.When I last saw him he put him on a beta-blocker standing at 12.5 mg twice daily.  He started noticing that he was just extremely fatigued being on a standing dose of beta-blocker.  We cut it down to once a day and he still felt fatigued even with that trivial once daily dosing.  We therefore decided to discontinue beta-blocker altogether, and not restart any antihypertensive agent.  Since stopping the beta-blocker, he is feeling much better.  Energy level notably improved.  He is much happier.  He really has not had any palpitations/irregular heartbeats or fast heart rate spells.  He notes that he feels much less short of breath than he was when he was recovering from [place].    No chest pain or pressure at rest or exertion.  No PND orthopnea or edema.  He  does still have some exertional shortness of breath but is quite deconditioned now.  He acknowledges that he is not really doing a very good job hydrating himself.  Partly because he is scared of having to stop to go to the bathroom.  He has a hard time rushing to get anywhere with his crutches and braces and therefore does not want to be in a situation where he is forced to rush to get to the bathroom.  As such, he always has a dry mouth.  CV Review of Symptoms (Summary) Cardiovascular ROS: positive for - dyspnea on exertion and Mild exercise intolerance and fatigue negative for - chest pain, edema, irregular heartbeat, loss of consciousness, orthopnea, palpitations, paroxysmal nocturnal dyspnea, rapid heart rate, shortness of breath, or Syncope or near syncope, TIA/amaurosis fugax,   REVIEWED OF SYSTEMS   Review of Systems  Constitutional:  Positive for malaise/fatigue (notalby less fatigued since stoping Metoprolol). Negative for weight loss.  HENT:  Negative for  congestion.   Respiratory:  Positive for shortness of breath (Some exertional dyspnea because of deconditioning.). Negative for cough.   Cardiovascular:  Negative for chest pain and leg swelling.  Gastrointestinal:  Negative for blood in stool and melena.  Genitourinary:  Negative for dysuria and hematuria.  Musculoskeletal:  Negative for joint pain.  Neurological:  Negative for dizziness and focal weakness.  Psychiatric/Behavioral: Negative.     I have reviewed and (if needed) personally updated the patient's problem list, medications, allergies, past medical and surgical history, social and family history.   PAST MEDICAL HISTORY   Past Medical History:  Diagnosis Date   Acute cholecystitis 03/2016   with sepsis   Arthritis    CAD S/P percutaneous coronary angioplasty 1995, 2000, 2005   a. 1995 s/p BMS;  b. 2000 s/p BMS;  c. 2005 s/p stent - All stents in [facility] (RCA, LAD & OM - unknown on which date);  d. 04/2015 NSTEMI/Cath: LM nl, ost/p LAD 20%, patent mLAD stent, RI small, OM2 patent stent, pRCA 20%, patent stent, EF 45-50%-->Med Rx.   Calculus of bile duct with acute cholangitis with obstruction    Chronic pain syndrome    a. Followed @ [facility];  b. Uses 12-14 excedrins per day.   Complication of anesthesia 04/01/2021   agiated- "battling nurses"   Duodenal stricture    Essential hypertension    Fatty liver    Gallstones    GERD (gastroesophageal reflux disease)    History of depression    History of post poliomyelitis muscular atrophy    HLD (hyperlipidemia)    Ischemic cardiomyopathy    a. 04/2015 Echo: EF 40-45%, mod antsept, ant, antlat, apical HK, mild AI/MR, PASP 47mHg.   Migraines    Neuropathy    due to post polio syndrome   OSA (obstructive sleep apnea)    does not want to use CPAP, using mouth guard   PAF ([place]ox[place]mal [place]) [place]    a. 04/2015 in setting of cholecystits and sepsis -->[place];  b. CHA2DS2VASc = 4--> Eliquis.   Pneumonia  04/2015   Post-polio syndrome    a. ambulates with braces.   [place] shock [place] 01/07/2018   Shoulder pain, right     PAST SURGICAL HISTORY   Past Surgical History:  Procedure Laterality Date   BALLOON DILATION N/A 06/06/2018   Procedure: BALLOON DILATION;  Surgeon: Mansouraty, GTelford Nab, MD;  [place]  Service: Gastroenterology;  Laterality: N/A;   BALLOON DILATION N/A 07/06/2018  Procedure: BALLOON DILATION;  Surgeon: Irving Copas., MD;  Location: [place];  Service: Gastroenterology;  Laterality: N/A;   BALLOON DILATION N/A 07/24/2021   Procedure: BALLOON DILATION;  Surgeon: Rush Landmark Telford Nab., MD;  Location: [place];  Service: Gastroenterology;  Laterality: N/A;   BILIARY BRUSHING  06/05/2021   Procedure: BILIARY BRUSHING;  Surgeon: Rush Landmark Telford Nab., MD;  Location: [place];  Service: Gastroenterology;;   BILIARY DILATION  06/05/2021   Procedure: BILIARY DILATION;  Surgeon: Irving Copas., MD;  Location: [place];  Service: Gastroenterology;;   BILIARY DILATION  09/15/2021   Procedure: BILIARY DILATION;  Surgeon: Irving Copas., MD;  [facility];  Service: Gastroenterology;;   BILIARY STENT PLACEMENT  01/08/2018   Procedure: BILIARY STENT PLACEMENT;  Surgeon: Jackquline Denmark, MD;  Location: [place];  Service: Endoscopy;;   BILIARY STENT PLACEMENT N/A 02/09/2018   Procedure: BILIARY STENT PLACEMENT;  Surgeon: Irving Copas., MD;  Location: [facility];  Service: Gastroenterology;  Laterality: N/A;   BILIARY STENT PLACEMENT  04/01/2021   Procedure: BILIARY STENT PLACEMENT;  Surgeon: Jackquline Denmark, MD;  Location: [facility] ENDOSCOPY;  Service: Endoscopy;;   BIOPSY  06/06/2018   Procedure: BIOPSY;  Surgeon: Irving Copas., MD;  Location: [facility] ENDOSCOPY;  Service: Gastroenterology;;   BIOPSY  04/01/2021   Procedure: BIOPSY;  Surgeon: Jackquline Denmark, MD;  Location: [facility] ENDOSCOPY;  Service:  Endoscopy;;   BIOPSY  06/05/2021   Procedure: BIOPSY;  Surgeon: Irving Copas., MD;  Location: [facility] ENDOSCOPY;  Service: Gastroenterology;;   CARDIAC CATHETERIZATION N/A 04/30/2015   Procedure: Left Heart Cath and Coronary Angiography;  Surgeon: Troy Sine, MD;  Location: [place] [facility];  Service: Cardiovascular; LM nl, ost/p LAD 20%, patent mLAD stent, RI small, OM2 patent stent, pRCA 20%, patent stent, EF 45-50%-->Med Rx    cervical spine stimulator  08/2012   CHOLECYSTECTOMY  06/10/2015   Procedure: LAPAROSCOPIC CHOLECYSTECTOMY;  Surgeon: Arta Bruce Kinsinger, MD;  Location: [place];  Service: General;;   COLONOSCOPY  09/2013   TA x2, rpt 5 yrs ([place])   COLONOSCOPY WITH ESOPHAGOGASTRODUODENOSCOPY (EGD)  06/2018   reactive gastropathy, tubular adenoma, rpt colonoscopy 5 yrs (Mansouraty)   COLONOSCOPY WITH PROPOFOL N/A 06/06/2018   Procedure: COLONOSCOPY WITH PROPOFOL;  Surgeon: Irving Copas., MD;  Location: [place];  Service: Gastroenterology;  Laterality: N/A;   CORONARY ANGIOPLASTY WITH STENT PLACEMENT  1995, 2000, 2005   stents in RCA, LAD & Cx-OM. -Stents were patent by cardiac catheterization in November 2016   ENDOSCOPIC RETROGRADE CHOLANGIOPANCREATOGRAPHY (ERCP) WITH PROPOFOL N/A 02/09/2018   Procedure: ENDOSCOPIC RETROGRADE CHOLANGIOPANCREATOGRAPHY (ERCP) WITH PROPOFOL;  Surgeon: Irving Copas., MD;  [facility];  Service: Gastroenterology;  Laterality: N/A;   ENDOSCOPIC RETROGRADE CHOLANGIOPANCREATOGRAPHY (ERCP) WITH PROPOFOL N/A 03/30/2018   Procedure: ENDOSCOPIC RETROGRADE CHOLANGIOPANCREATOGRAPHY (ERCP) WITH PROPOFOL;  Surgeon: Rush Landmark Telford Nab., MD;  Location[facility];  Service: Gastroenterology;  Laterality: N/A;   ENDOSCOPIC RETROGRADE CHOLANGIOPANCREATOGRAPHY (ERCP) WITH PROPOFOL N/A 06/05/2021   Procedure: ENDOSCOPIC RETROGRADE CHOLANGIOPANCREATOGRAPHY (ERCP) WITH PROPOFOL;  Surgeon: Rush Landmark Telford Nab., MD;   Location: [place];  Service: Gastroenterology;  Laterality: N/A;   ENDOSCOPIC RETROGRADE CHOLANGIOPANCREATOGRAPHY (ERCP) WITH PROPOFOL N/A 09/15/2021   Procedure: ENDOSCOPIC RETROGRADE CHOLANGIOPANCREATOGRAPHY (ERCP) WITH PROPOFOL;  Surgeon: Rush Landmark Telford Nab., MD;  [facility];  Service: Gastroenterology;  Laterality: N/A;   ERCP N/A 01/08/2018   Procedure: ENDOSCOPIC RETROGRADE CHOLANGIOPANCREATOGRAPHY (ERCP);  Surgeon: Jackquline Denmark, MD;  Location: [facility] ENDOSCOPY;  Service: Endoscopy;  Laterality:  N/A;   ERCP N/A 04/01/2021   Procedure: ENDOSCOPIC RETROGRADE CHOLANGIOPANCREATOGRAPHY (ERCP);  Surgeon: Jackquline Denmark, MD;  Location: [facility] ENDOSCOPY;  Service: Endoscopy;  Laterality: N/A;  Please schedule after 1 PM   ESOPHAGOGASTRODUODENOSCOPY N/A 03/30/2018   Procedure: ESOPHAGOGASTRODUODENOSCOPY (EGD);  Surgeon: Irving Copas., MD;  [facility];  Service: Gastroenterology;  Laterality: N/A;   ESOPHAGOGASTRODUODENOSCOPY (EGD) WITH PROPOFOL N/A 06/06/2018   Procedure: ESOPHAGOGASTRODUODENOSCOPY (EGD) WITH PROPOFOL;  Surgeon: Rush Landmark Telford Nab., MD;  Location: [place];  Service: Gastroenterology;  Laterality: N/A;   ESOPHAGOGASTRODUODENOSCOPY (EGD) WITH PROPOFOL N/A 07/06/2018   Procedure: ESOPHAGOGASTRODUODENOSCOPY (EGD) WITH PROPOFOL;  Surgeon: Rush Landmark Telford Nab., MD;  Location: [place];  Service: Gastroenterology;  Laterality: N/A;   ESOPHAGOGASTRODUODENOSCOPY (EGD) WITH PROPOFOL N/A 07/24/2021   Procedure: ESOPHAGOGASTRODUODENOSCOPY (EGD) WITH PROPOFOL WITH FLUOROSCOPY;  Surgeon: Rush Landmark Telford Nab., MD;  Location: [place];  Service: Gastroenterology;  Laterality: N/A;   EUS N/A 07/24/2021   Procedure: UPPER ENDOSCOPIC ULTRASOUND (EUS) RADIAL;  Surgeon: Irving Copas., MD;  Location: [place];  Service: Gastroenterology;  Laterality: N/A;   lumbar spine stimulator     POLYPECTOMY  06/06/2018   Procedure: POLYPECTOMY;   Surgeon: Mansouraty, Telford Nab., MD;  Location: [place];  Service: Gastroenterology;;   REMOVAL OF STONES  03/30/2018   Procedure: REMOVAL OF STONES;  Surgeon: Irving Copas., MD;  Location[facility];  Service: Gastroenterology;;   REMOVAL OF STONES  06/05/2021   Procedure: REMOVAL OF STONES;  Surgeon: Irving Copas., MD;  Location: [place];  Service: Gastroenterology;;   REMOVAL OF STONES  09/15/2021   Procedure: REMOVAL OF STONES;  Surgeon: Irving Copas., MD;  Location[facility];  Service: Gastroenterology;;   Joan Mayans  01/08/2018   Procedure: Joan Mayans;  Surgeon: Jackquline Denmark, MD;  Location: [place];  Service: Endoscopy;;   SPHINCTEROTOMY  02/09/2018   Procedure: Joan Mayans;  Surgeon: Mansouraty, Telford Nab., MD;  Location: [facility];  Service: Gastroenterology;;   [facility] CHOLANGIOSCOPY N/A 06/05/2021   Procedure: [id number] CHOLANGIOSCOPY;  Surgeon: Irving Copas., MD;  Location: [place];  Service: Gastroenterology;  Laterality: N/A;   STENT REMOVAL  02/09/2018   Procedure: STENT REMOVAL;  Surgeon: Irving Copas., MD;  [facility];  Service: Gastroenterology;;   Lavell Islam REMOVAL  03/30/2018   Procedure: STENT REMOVAL;  Surgeon: Irving Copas., MD;  Location[facility];  Service: Gastroenterology;;   Lavell Islam REMOVAL  06/05/2021   Procedure: STENT REMOVAL;  Surgeon: Irving Copas., MD;  Location: [place];  Service: Gastroenterology;;   TRANSTHORACIC ECHOCARDIOGRAM  04/2015   EF 40-45%, mod antsept, ant, antlat, apical HK, mild AI/MR, PASP 92mHg.   TRANSTHORACIC ECHOCARDIOGRAM  08/23/2020   EF 55 to 60%.  No RWMA.  Mild LVH.  GR 1 DD.  Normal RV size and function with normal RVP.  Normal mitral and aortic valve.   UVULOPALATOPHARYNGOPLASTY, TONSILLECTOMY AND SEPTOPLASTY  2002   Cardiac Cath 04/30/2015: LM nl, ost/p LAD 20%, patent mLAD stent, RI small, OM2 patent  stent, pRCA 20%, patent stent, EF 45-50%-->Med Rx     Immunization History  Administered Date(s) Administered   Influenza, High Dose Seasonal PF 03/04/2016, 02/21/2018, 02/23/2019, 03/11/2021   Influenza, Seasonal, Injecte, Preservative Fre 05/03/2012, 05/05/2014, 02/28/2015   Influenza,inj,Quad PF,6+ Mos 02/27/2013   Influenza-Unspecified 03/17/2017   PFIZER Comirnaty(Gray Top)Covid-19 Tri-Sucrose Vaccine 03/23/2020, 09/28/2020   PFIZER(Purple Top)SARS-COV-2 Vaccination 08/25/2019, 09/19/2019   Pneumococcal Conjugate-13 04/28/2015   Pneumococcal Polysaccharide-23 10/30/2016   Td 05/03/2012   Zoster, Live  06/29/2013    MEDICATIONS/ALLERGIES   Current Meds  Medication Sig   acetaminophen (TYLENOL) 500 MG tablet Take 1,000 mg by mouth every 6 (six) hours as needed for moderate pain or headache.    Ascorbic Acid (VITAMIN C) 1000 MG tablet Take 1,000 mg by mouth daily.   aspirin EC 81 MG tablet Take 81 mg by mouth daily. Swallow whole.   Carboxymethylcellulose Sodium (LUBRICANT EYE DROPS OP) Place 2 drops into both eyes daily as needed (for dry eyes).   Cholecalciferol (VITAMIN D3) 50 MCG (2000 UT) TABS Take 2,000 Units by mouth daily.   gabapentin (NEURONTIN) 600 MG tablet TAKE 2 TABLETS BY MOUTH 3  TIMES DAILY   ibuprofen (ADVIL) 200 MG tablet Take 400 mg by mouth every 6 (six) hours as needed for headache or moderate pain.   Magnesium 250 MG TABS Take 1 tablet (250 mg total) by mouth at bedtime. (Patient taking differently: Take 250 mg by mouth at bedtime.)   Multiple Vitamin (MULTIVITAMIN WITH MINERALS) TABS tablet Take 1 tablet by mouth daily.   Omega-3 Fatty Acids (FISH OIL) 1000 MG CAPS Take 1,000 mg by mouth daily.    omeprazole (PRILOSEC) 40 MG capsule Take 1 capsule (40 mg total) by mouth 2 (two) times daily before a meal.   rosuvastatin (CRESTOR) 40 MG tablet Take 1 tablet (40 mg total) by mouth daily.   sildenafil (VIAGRA) 100 MG tablet TAKE 1/2 TO 1 TABLET BY MOUTH DAILY AS  NEEDED FOR ERECTILE DYSFUNCTION (Patient taking differently: Take 50-100 mg by mouth daily as needed for erectile dysfunction.)   vitamin B-12 (CYANOCOBALAMIN) 1000 MCG tablet Take 1,000 mcg by mouth daily.   vitamin E 400 UNIT capsule Take 400 Units by mouth daily.    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  Constitutional:      Appearance: Normal appearance.     Comments: Stable, healthy-appearing gentleman.  Barrel chested very large shoulders from using bilateral crutches.  Actual weight without all of his braces is 18 pounds less than the 218 pounds listed.  HENT:     Head: Normocephalic and  atraumatic.  Neck:     Vascular: No carotid bruit or JVD.  Cardiovascular:     Rate and Rhythm: Normal rate and regular rhythm. No extrasystoles are present.    Chest Wall: PMI is not displaced.      Pulses: Intact distal pulses. Decreased pulses (Unable to palpate pedal pulses he is wearing heavy boots).     Heart sounds: Normal heart sounds. No murmur heard.   No friction rub. No gallop.  Pulmonary:     Effort: Pulmonary effort is normal. No respiratory distress.     Breath sounds: Normal breath sounds. No wheezing, rhonchi or rales.  Chest:     Chest wall: No tenderness.  Musculoskeletal:     Cervical back: Normal range of motion and neck supple.  Skin:    General: Skin is warm and dry.     Coloration: Skin is not jaundiced or pale.  Neurological:     General: No focal deficit present.     Mental Status: He is alert and oriented to person, place, and time. Mental status is at baseline.     Gait: Gait abnormal (Walks with 2 crutches).  Psychiatric:        Mood and Affect: Mood normal.        Behavior: Behavior normal.        Thought Content: Thought content normal.        Judgment: Judgment normal.    Adult ECG Report NSR-67 bpm, LVH with mild repolarization changes.  Borderline incomplete RBBB  Recent Labs: Reviewed 10/11/2020: TC 120, TG 116, HDL 46, LDL 50.  A1c 5.8; 07/31/2021: Hgb 14.9, Cr 0.57, K+ 3.9  ADDENDUM-Labs ordered during this visit Lab Results  Component Value Date   CHOL 109 10/24/2021   HDL 45.10 10/24/2021   LDLCALC 42 10/24/2021   LDLDIRECT 213.1 06/29/2013   TRIG 112.0 10/24/2021   CHOLHDL 2 10/24/2021   Lab Results  Component Value Date   CREATININE 0.58 10/24/2021   BUN 19 10/24/2021   NA 141 10/24/2021   K 4.3 10/24/2021   CL 105 10/24/2021   CO2 31 10/24/2021      Latest Ref Rng & Units 10/24/2021   12:50 PM 07/31/2021    3:36 PM 04/09/2021   12:15 PM  CBC  WBC 4.0 - 10.5 K/uL 8.9   9.4   12.2    Hemoglobin 13.0 - 17.0 g/dL 14.5   14.9   14.2    Hematocrit 39.0 - 52.0 % 42.4   42.7   42.3    Platelets 150.0 - 400.0 K/uL 232.0   224.0   362.0      Lab Results  Component Value Date   HGBA1C 5.8 10/24/2021   Lab Results  Component  Value Date   TSH 0.74 10/24/2021    ==================================================  COVID-19 Education: The signs and symptoms of COVID-19 were discussed with the patient and how to seek care for testing (follow up with PCP or arrange E-visit).    I spent a total of 25 minutes with the patient spent in direct patient consultation.  Additional time spent with chart review  / charting (studies, outside notes, etc): 17 min Total Time: 42 min  Current medicines are reviewed at length with the patient today.  (+/- concerns) none  This visit occurred during the SARS-CoV-2 public health emergency.  Safety protocols were in place, including screening questions prior to the visit, additional usage of staff PPE, and extensive cleaning of exam room while  observing appropriate contact time as indicated for disinfecting solutions.  Notice: This dictation was prepared with Dragon dictation along with smart phrase technology. Any transcriptional errors that result from this process are unintentional and may not be corrected upon review.  Studies Ordered:   Orders Placed This Encounter  Procedures   EKG 12-Lead   No orders of the defined types were placed in this encounter.   Patient Instructions / Medication Changes & Studies & Tests Ordered   Patient Instructions  Medication Instructions:   No changes  *If you need a refill on your cardiac medications before your next appointment, please call your pharmacy*   Lab Work:  Not needed   Testing/Procedures: Not needed   Follow-Up: At [facility], you and your health needs are our priority.  As part of our continuing mission to provide you with exceptional heart care, we have created designated Provider Care Teams.  These Care Teams include your primary Cardiologist (physician) and Advanced Practice Providers (APPs -  Physician Assistants and Nurse Practitioners) who all work together to provide you with the care you need, when you  need it.     Your next appointment:   12 month(s)  The format for your next appointment:   In Person  Provider:   Glenetta Hew, MD       Glenetta Hew, M.D., M.S. Interventional Cardiologist   Pager # [phone] Phone # [phone] [address]. [place], [place] [address]   Thank you for choosing Heartcare at [facility]!!

## 2021-10-15 ENCOUNTER — Encounter: Payer: Self-pay | Admitting: Family Medicine

## 2021-10-20 NOTE — Telephone Encounter (Signed)
Spoke with pt scheduling OV for prostate issues on 10/24/21 at 12:00.  Then scheduled CPE on 10/28/21 at 12:00.

## 2021-10-20 NOTE — Telephone Encounter (Addendum)
Patient has wellness phone call with health advisor tomorrow. Please call and schedule appt for prostate eval as well as separate visit for CPE.

## 2021-10-21 ENCOUNTER — Ambulatory Visit (INDEPENDENT_AMBULATORY_CARE_PROVIDER_SITE_OTHER): Payer: Medicare Other | Admitting: *Deleted

## 2021-10-21 DIAGNOSIS — Z0000 Encounter for general adult medical examination without abnormal findings: Secondary | ICD-10-CM

## 2021-10-21 NOTE — Patient Instructions (Signed)
[name] , Thank you for taking time to come for your Medicare Wellness Visit. I appreciate your ongoing commitment to your health goals. Please review the following plan we discussed and let me know if I can assist you in the future.   Screening recommendations/referrals: Colonoscopy: up to date Recommended yearly ophthalmology/optometry visit for glaucoma screening and checkup Recommended yearly dental visit for hygiene and checkup  Vaccinations: Influenza vaccine: up to date Pneumococcal vaccine: up to date Tdap vaccine: up to date Shingles vaccine: Education provided    Advanced directives: on file   Next appointment: 10-24-2021 @ 12:00 [name]   10-28-2021 @ 12:00 [name] 72 Years and Older, Male Preventive care refers to lifestyle choices and visits with your health care provider that can promote health and wellness. What does preventive care include? A yearly physical exam. This is also called an annual well check. Dental exams once or twice a year. Routine eye exams. Ask your health care provider how often you should have your eyes checked. Personal lifestyle choices, including: Daily care of your teeth and gums. Regular physical activity. Eating a healthy diet. Avoiding tobacco and drug use. Limiting alcohol use. Practicing safe sex. Taking low doses of aspirin every day. Taking vitamin and mineral supplements as recommended by your health care provider. What happens during an annual well check? The services and screenings done by your health care provider during your annual well check will depend on your age, overall health, lifestyle risk factors, and family history of disease. Counseling  Your health care provider may ask you questions about your: Alcohol use. Tobacco use. Drug use. Emotional well-being. Home and relationship well-being. Sexual activity. Eating habits. History of falls. Memory and ability to understand  (cognition). Work and work Statistician. Screening  You may have the following tests or measurements: Height, weight, and BMI. Blood pressure. Lipid and cholesterol levels. These may be checked every 5 years, or more frequently if you are over 72 years old. Skin check. Lung cancer screening. You may have this screening every year starting at age 72 if you have a 30-pack-year history of smoking and currently smoke or have quit within the past 15 years. Fecal occult blood test (FOBT) of the stool. You may have this test every year starting at age 72. Flexible sigmoidoscopy or colonoscopy. You may have a sigmoidoscopy every 5 years or a colonoscopy every 10 years starting at age 72. Prostate cancer screening. Recommendations will vary depending on your family history and other risks. Hepatitis C blood test. Hepatitis B blood test. Sexually transmitted disease (STD) testing. Diabetes screening. This is done by checking your blood sugar (glucose) after you have not eaten for a while (fasting). You may have this done every 1-3 years. Abdominal aortic aneurysm (AAA) screening. You may need this if you are a current or former smoker. Osteoporosis. You may be screened starting at age 72 if you are at high risk. Talk with your health care provider about your test results, treatment options, and if necessary, the need for more tests. Vaccines  Your health care provider may recommend certain vaccines, such as: Influenza vaccine. This is recommended every year. Tetanus, diphtheria, and acellular pertussis (Tdap, Td) vaccine. You may need a Td booster every 10 years. Zoster vaccine. You may need this after age 72. Pneumococcal 13-valent conjugate (PCV13) vaccine. One dose is recommended after age 72. Pneumococcal polysaccharide (PPSV23) vaccine. One dose is recommended after age 72. Talk to your health care provider about which  screenings and vaccines you need and how often you need them. This  information is not intended to replace advice given to you by your health care provider. Make sure you discuss any questions you have with your health care provider. Document Released: 06/14/2015 Document Revised: 02/05/2016 Document Reviewed: 03/19/2015 Elsevier Interactive Patient Education  2017 [place] Prevention in the Home Falls can cause injuries. They can happen to people of all ages. There are many things you can do to make your home safe and to help prevent falls. What can I do on the outside of my home? Regularly fix the edges of walkways and driveways and fix any cracks. Remove anything that might make you trip as you walk through a door, such as a raised step or threshold. Trim any bushes or trees on the path to your home. Use bright outdoor lighting. Clear any walking paths of anything that might make someone trip, such as rocks or tools. Regularly check to see if handrails are loose or broken. Make sure that both sides of any steps have handrails. Any raised decks and porches should have guardrails on the edges. Have any leaves, snow, or ice cleared regularly. Use sand or salt on walking paths during winter. Clean up any spills in your garage right away. This includes oil or grease spills. What can I do in the bathroom? Use night lights. Install grab bars by the toilet and in the tub and shower. Do not use towel bars as grab bars. Use non-skid mats or decals in the tub or shower. If you need to sit down in the shower, use a plastic, non-slip stool. Keep the floor dry. Clean up any water that spills on the floor as soon as it happens. Remove soap buildup in the tub or shower regularly. Attach bath mats securely with double-sided non-slip rug tape. Do not have throw rugs and other things on the floor that can make you trip. What can I do in the bedroom? Use night lights. Make sure that you have a light by your bed that is easy to reach. Do not use any sheets or  blankets that are too big for your bed. They should not hang down onto the floor. Have a firm chair that has side arms. You can use this for support while you get dressed. Do not have throw rugs and other things on the floor that can make you trip. What can I do in the kitchen? Clean up any spills right away. Avoid walking on wet floors. Keep items that you use a lot in easy-to-reach places. If you need to reach something above you, use a strong step stool that has a grab bar. Keep electrical cords out of the way. Do not use floor polish or wax that makes floors slippery. If you must use wax, use non-skid floor wax. Do not have throw rugs and other things on the floor that can make you trip. What can I do with my stairs? Do not leave any items on the stairs. Make sure that there are handrails on both sides of the stairs and use them. Fix handrails that are broken or loose. Make sure that handrails are as long as the stairways. Check any carpeting to make sure that it is firmly attached to the stairs. Fix any carpet that is loose or worn. Avoid having throw rugs at the top or bottom of the stairs. If you do have throw rugs, attach them to the floor with carpet  tape. Make sure that you have a light switch at the top of the stairs and the bottom of the stairs. If you do not have them, ask someone to add them for you. What else can I do to help prevent falls? Wear shoes that: Do not have high heels. Have rubber bottoms. Are comfortable and fit you well. Are closed at the toe. Do not wear sandals. If you use a stepladder: Make sure that it is fully opened. Do not climb a closed stepladder. Make sure that both sides of the stepladder are locked into place. Ask someone to hold it for you, if possible. Clearly mark and make sure that you can see: Any grab bars or handrails. First and last steps. Where the edge of each step is. Use tools that help you move around (mobility aids) if they are  needed. These include: Canes. Walkers. Scooters. Crutches. Turn on the lights when you go into a dark area. Replace any light bulbs as soon as they burn out. Set up your furniture so you have a clear path. Avoid moving your furniture around. If any of your floors are uneven, fix them. If there are any pets around you, be aware of where they are. Review your medicines with your doctor. Some medicines can make you feel dizzy. This can increase your chance of falling. Ask your doctor what other things that you can do to help prevent falls. This information is not intended to replace advice given to you by your health care provider. Make sure you discuss any questions you have with your health care provider. Document Released: 03/14/2009 Document Revised: 10/24/2015 Document Reviewed: 06/22/2014 Elsevier Interactive Patient Education  2017 [employer].

## 2021-10-21 NOTE — Progress Notes (Signed)
Subjective:   Alexander [name] is a 71 y.o. male who presents for Medicare Annual/Subsequent preventive examination.  I connected with  Alexander [name] on 10/21/21 by a telephone enabled telemedicine application and verified that I am speaking with the correct person using two identifiers.   I discussed the limitations of evaluation and management by telemedicine. The patient expressed understanding and agreed to proceed.  Patient location: home  Provider location: Tele-Health -  home    Review of Systems           Objective:    There were no vitals filed for this visit. There is no height or weight on file to calculate BMI.     09/15/2021    8:41 AM 07/24/2021    8:32 AM 06/05/2021    7:18 AM 04/01/2021   12:07 PM 11/24/2018    9:20 AM 07/06/2018    7:26 AM 06/06/2018    6:28 AM  Advanced Directives  Does Patient Have a Medical Advance Directive? Yes Yes No No No No No  Type of Advance Directive Living will [place];Living will       Copy of [place] in Chart?  No - copy requested       Would patient like information on creating a medical advance directive?   Yes (MAU/Ambulatory/Procedural Areas - Information given) No - Patient declined No - Patient declined No - Patient declined No - Patient declined    Current Medications (verified) Outpatient Encounter Medications as of 10/21/2021  Medication Sig   acetaminophen (TYLENOL) 500 MG tablet Take 1,000 mg by mouth every 6 (six) hours as needed for moderate pain or headache.    Ascorbic Acid (VITAMIN C) 1000 MG tablet Take 1,000 mg by mouth daily.   aspirin EC 81 MG tablet Take 81 mg by mouth daily. Swallow whole.   Carboxymethylcellulose Sodium (LUBRICANT EYE DROPS OP) Place 2 drops into both eyes daily as needed (for dry eyes).   Cholecalciferol (VITAMIN D3) 50 MCG (2000 UT) TABS Take 2,000 Units by mouth daily.   gabapentin (NEURONTIN) 600 MG tablet TAKE 2 TABLETS BY MOUTH 3  TIMES DAILY    ibuprofen (ADVIL) 200 MG tablet Take 400 mg by mouth every 6 (six) hours as needed for headache or moderate pain.   Magnesium 250 MG TABS Take 1 tablet (250 mg total) by mouth at bedtime. (Patient taking differently: Take 250 mg by mouth at bedtime.)   Multiple Vitamin (MULTIVITAMIN WITH MINERALS) TABS tablet Take 1 tablet by mouth daily.   Omega-3 Fatty Acids (FISH OIL) 1000 MG CAPS Take 1,000 mg by mouth daily.    omeprazole (PRILOSEC) 40 MG capsule Take 1 capsule (40 mg total) by mouth 2 (two) times daily before a meal.   rosuvastatin (CRESTOR) 40 MG tablet Take 1 tablet (40 mg total) by mouth daily.   sildenafil (VIAGRA) 100 MG tablet TAKE 1/2 TO 1 TABLET BY MOUTH DAILY AS NEEDED FOR ERECTILE DYSFUNCTION (Patient taking differently: Take 50-100 mg by mouth daily as needed for erectile dysfunction.)   vitamin B-12 (CYANOCOBALAMIN) 1000 MCG tablet Take 1,000 mcg by mouth daily.   vitamin E 400 UNIT capsule Take 400 Units by mouth daily.   No facility-administered encounter medications on file as of 10/21/2021.    Allergies (verified) Patient has no known allergies.   History: Past Medical History:  Diagnosis Date   Acute cholecystitis 03/2016   with sepsis   Arthritis    CAD  S/P percutaneous coronary angioplasty 1995, 2000, 2005   a. 1995 s/p BMS;  b. 2000 s/p BMS;  c. 2005 s/p stent - All stents in [facility] (RCA, LAD & OM - unknown on which date);  d. 04/2015 NSTEMI/Cath: LM nl, ost/p LAD 20%, patent mLAD stent, RI small, OM2 patent stent, pRCA 20%, patent stent, EF 45-50%-->Med Rx.   Calculus of bile duct with acute cholangitis with obstruction    Chronic pain syndrome    a. Followed @ [facility];  b. Uses 12-14 excedrins per day.   Complication of anesthesia 04/01/2021   agiated- "battling nurses"   Duodenal stricture    Essential hypertension    Fatty liver    Gallstones    GERD (gastroesophageal reflux disease)    History of depression    History of post  poliomyelitis muscular atrophy    HLD (hyperlipidemia)    Ischemic cardiomyopathy    a. 04/2015 Echo: EF 40-45%, mod antsept, ant, antlat, apical HK, mild AI/MR, PASP 46mHg.   Migraines    Neuropathy    due to post polio syndrome   OSA (obstructive sleep apnea)    does not want to use CPAP, using mouth guard   PAF (paroxysmal atrial fibrillation) ([facility]    a. 04/2015 in setting of cholecystits and sepsis -->Amio;  b. CHA2DS2VASc = 4--> Eliquis.   Pneumonia 04/2015   Post-polio syndrome    a. ambulates with braces.   Septic shock ([facility] 01/07/2018   Shoulder pain, right    Past Surgical History:  Procedure Laterality Date   BALLOON DILATION N/A 06/06/2018   Procedure: BALLOON DILATION;  Surgeon: MRush LandmarkGTelford Nab, MD;  [place]  Service: Gastroenterology;  Laterality: N/A;   BALLOON DILATION N/A 07/06/2018   Procedure: BALLOON DILATION;  Surgeon: MRush LandmarkGTelford Nab, MD;  [place]  Service: Gastroenterology;  Laterality: N/A;   BALLOON DILATION N/A 07/24/2021   Procedure: BALLOON DILATION;  Surgeon: MRush LandmarkGTelford Nab, MD;  [place]  Service: Gastroenterology;  Laterality: N/A;   BILIARY BRUSHING  06/05/2021   Procedure: BILIARY BRUSHING;  Surgeon: MRush LandmarkGTelford Nab, MD;  [place]  Service: Gastroenterology;;   BILIARY DILATION  06/05/2021   Procedure: BILIARY DILATION;  Surgeon: MIrving Copas, MD;  [place]  Service: Gastroenterology;;   BILIARY DILATION  09/15/2021   Procedure: BILIARY DILATION;  Surgeon: MIrving Copas, MD;  [facility];  Service: Gastroenterology;;   BILIARY STENT PLACEMENT  01/08/2018   Procedure: BILIARY STENT PLACEMENT;  Surgeon: GJackquline Denmark MD;  [place]  Service: Endoscopy;;   BILIARY STENT PLACEMENT N/A 02/09/2018   Procedure: BILIARY STENT PLACEMENT;  Surgeon: MIrving Copas, MD;  [facility];  Service:  Gastroenterology;  Laterality: N/A;   BILIARY STENT PLACEMENT  04/01/2021   Procedure: BILIARY STENT PLACEMENT;  Surgeon: GJackquline Denmark MD;  [facility]  Service: Endoscopy;;   BIOPSY  06/06/2018   Procedure: BIOPSY;  Surgeon: MIrving Copas, MD;  [facility]  Service: Gastroenterology;;   BIOPSY  04/01/2021   Procedure: BIOPSY;  Surgeon: GJackquline Denmark MD;  [facility]  Service: Endoscopy;;   BIOPSY  06/05/2021   Procedure: BIOPSY;  Surgeon: MIrving Copas, MD;  [facility]  Service: Gastroenterology;;   CARDIAC CATHETERIZATION N/A 04/30/2015   Procedure: Left Heart Cath and Coronary Angiography;  Surgeon: TTroy Sine MD;  [place] [facility];  Service: Cardiovascular; LM nl, ost/p  LAD 20%, patent mLAD stent, RI small, OM2 patent stent, pRCA 20%, patent stent, EF 45-50%-->Med Rx    cervical spine stimulator  08/2012   CHOLECYSTECTOMY  06/10/2015   Procedure: LAPAROSCOPIC CHOLECYSTECTOMY;  Surgeon: Arta Bruce Kinsinger, MD;  Location: [place];  Service: General;;   COLONOSCOPY  09/2013   TA x2, rpt 5 yrs ([place])   COLONOSCOPY WITH ESOPHAGOGASTRODUODENOSCOPY (EGD)  06/2018   reactive gastropathy, tubular adenoma, rpt colonoscopy 5 yrs (Mansouraty)   COLONOSCOPY WITH PROPOFOL N/A 06/06/2018   Procedure: COLONOSCOPY WITH PROPOFOL;  Surgeon: Irving Copas., MD;  Location: [place];  Service: Gastroenterology;  Laterality: N/A;   CORONARY ANGIOPLASTY WITH STENT PLACEMENT  1995, 2000, 2005   stents in RCA, LAD & Cx-OM. -Stents were patent by cardiac catheterization in November 2016   ENDOSCOPIC RETROGRADE CHOLANGIOPANCREATOGRAPHY (ERCP) WITH PROPOFOL N/A 02/09/2018   Procedure: ENDOSCOPIC RETROGRADE CHOLANGIOPANCREATOGRAPHY (ERCP) WITH PROPOFOL;  Surgeon: Irving Copas., MD;  [facility];  Service: Gastroenterology;  Laterality: N/A;   ENDOSCOPIC RETROGRADE CHOLANGIOPANCREATOGRAPHY (ERCP) WITH  PROPOFOL N/A 03/30/2018   Procedure: ENDOSCOPIC RETROGRADE CHOLANGIOPANCREATOGRAPHY (ERCP) WITH PROPOFOL;  Surgeon: Rush Landmark Telford Nab., MD;  [facility];  Service: Gastroenterology;  Laterality: N/A;   ENDOSCOPIC RETROGRADE CHOLANGIOPANCREATOGRAPHY (ERCP) WITH PROPOFOL N/A 06/05/2021   Procedure: ENDOSCOPIC RETROGRADE CHOLANGIOPANCREATOGRAPHY (ERCP) WITH PROPOFOL;  Surgeon: Rush Landmark Telford Nab., MD;  Location: [place];  Service: Gastroenterology;  Laterality: N/A;   ENDOSCOPIC RETROGRADE CHOLANGIOPANCREATOGRAPHY (ERCP) WITH PROPOFOL N/A 09/15/2021   Procedure: ENDOSCOPIC RETROGRADE CHOLANGIOPANCREATOGRAPHY (ERCP) WITH PROPOFOL;  Surgeon: Rush Landmark Telford Nab., MD;  [facility];  Service: Gastroenterology;  Laterality: N/A;   ERCP N/A 01/08/2018   Procedure: ENDOSCOPIC RETROGRADE CHOLANGIOPANCREATOGRAPHY (ERCP);  Surgeon: Jackquline Denmark, MD;  [facility];  Service: Endoscopy;  Laterality: N/A;   ERCP N/A 04/01/2021   Procedure: ENDOSCOPIC RETROGRADE CHOLANGIOPANCREATOGRAPHY (ERCP);  Surgeon: Jackquline Denmark, MD;  [facility];  Service: Endoscopy;  Laterality: N/A;  Please schedule after 1 PM   ESOPHAGOGASTRODUODENOSCOPY N/A 03/30/2018   Procedure: ESOPHAGOGASTRODUODENOSCOPY (EGD);  Surgeon: Irving Copas., MD;  [facility];  Service: Gastroenterology;  Laterality: N/A;   ESOPHAGOGASTRODUODENOSCOPY (EGD) WITH PROPOFOL N/A 06/06/2018   Procedure: ESOPHAGOGASTRODUODENOSCOPY (EGD) WITH PROPOFOL;  Surgeon: Rush Landmark Telford Nab., MD;  Location: [place];  Service: Gastroenterology;  Laterality: N/A;   ESOPHAGOGASTRODUODENOSCOPY (EGD) WITH PROPOFOL N/A 07/06/2018   Procedure: ESOPHAGOGASTRODUODENOSCOPY (EGD) WITH PROPOFOL;  Surgeon: Rush Landmark Telford Nab., MD;  Location: [place];  Service: Gastroenterology;  Laterality: N/A;   ESOPHAGOGASTRODUODENOSCOPY (EGD) WITH PROPOFOL N/A 07/24/2021   Procedure: ESOPHAGOGASTRODUODENOSCOPY  (EGD) WITH PROPOFOL WITH FLUOROSCOPY;  Surgeon: Rush Landmark Telford Nab., MD;  Location: [place];  Service: Gastroenterology;  Laterality: N/A;   EUS N/A 07/24/2021   Procedure: UPPER ENDOSCOPIC ULTRASOUND (EUS) RADIAL;  Surgeon: Irving Copas., MD;  Location: [place];  Service: Gastroenterology;  Laterality: N/A;   lumbar spine stimulator     POLYPECTOMY  06/06/2018   Procedure: POLYPECTOMY;  Surgeon: Mansouraty, Telford Nab., MD;  Location: [place];  Service: Gastroenterology;;   REMOVAL OF STONES  03/30/2018   Procedure: REMOVAL OF STONES;  Surgeon: Irving Copas., MD;  [facility];  Service: Gastroenterology;;   REMOVAL OF STONES  06/05/2021   Procedure: REMOVAL OF STONES;  Surgeon: Irving Copas., MD;  Location: [place];  Service: Gastroenterology;;   REMOVAL OF STONES  09/15/2021   Procedure: REMOVAL OF STONES;  Surgeon: Irving Copas., MD;  [facility];  Service: Gastroenterology;;   Joan Mayans  01/08/2018   Procedure: SPHINCTEROTOMY;  Surgeon: Jackquline Denmark, MD;  Location: [place];  Service: Endoscopy;;   SPHINCTEROTOMY  02/09/2018   Procedure: Joan Mayans;  Surgeon: Mansouraty, Telford Nab., MD;  [facility];  Service: Gastroenterology;;   Bess Kinds CHOLANGIOSCOPY N/A 06/05/2021   Procedure: [id number] CHOLANGIOSCOPY;  Surgeon: Irving Copas., MD;  Location: [place];  Service: Gastroenterology;  Laterality: N/A;   STENT REMOVAL  02/09/2018   Procedure: STENT REMOVAL;  Surgeon: Irving Copas., MD;  [facility];  Service: Gastroenterology;;   Lavell Islam REMOVAL  03/30/2018   Procedure: STENT REMOVAL;  Surgeon: Irving Copas., MD;  [facility];  Service: Gastroenterology;;   Lavell Islam REMOVAL  06/05/2021   Procedure: STENT REMOVAL;  Surgeon: Irving Copas., MD;  Location: [place];  Service: Gastroenterology;;   TRANSTHORACIC ECHOCARDIOGRAM  04/2015    EF 40-45%, mod antsept, ant, antlat, apical HK, mild AI/MR, PASP 72mHg.   UVULOPALATOPHARYNGOPLASTY, TONSILLECTOMY AND SEPTOPLASTY  2002   Family History  Problem Relation Age of Onset   Diabetes Mother    Heart disease Mother    Cancer Sister 429      male cancer   Breast cancer Brother 682  Hypertension Maternal Aunt    Cancer Maternal Uncle        spinal   Coronary artery disease Neg Hx    Stroke Neg Hx    Colon cancer Neg Hx    Pancreatic cancer Neg Hx    Stomach cancer Neg Hx    Esophageal cancer Neg Hx    Inflammatory bowel disease Neg Hx    Liver disease Neg Hx    Rectal cancer Neg Hx    Social History   Socioeconomic History   Marital status: Married    Spouse name: Robyn   Number of children: 3   Years of education: Not on file   Highest education level: Not on file  Occupational History   Occupation: retired  Tobacco Use   Smoking status: Former    Types: Cigarettes    Quit date: 1972    Years since quitting: 51.4   Smokeless tobacco: Never   Tobacco comments:    was very light  Vaping Use   Vaping Use: Never used  Substance and Sexual Activity   Alcohol use: Not Currently    Comment: ocassional -maybe 2 beers   Drug use: No   Sexual activity: Yes  Other Topics Concern   Not on file  Social History Narrative   Caffeine: 1 cup soda, occasional coffee   Lives with wife (Alexander [name] and 172yo son, 4 dogs   Previously worked for [employer] 2002 for post-polio syndrome   Activity: no regular activity   Diet: overall healthy, good fruits and vegetables, good amt water   Social Determinants of Health   Financial Resource Strain: Not on file  Food Insecurity: Not on file  Transportation Needs: Not on file  Physical Activity: Not on file  Stress: Not on file  Social Connections: Not on file    Tobacco Counseling Counseling given: Not Answered Tobacco comments: was very light   Clinical Intake:               How often do you need to have someone help you when you read instructions, pamphlets, or other written materials from your doctor or pharmacy?: (P) 1 - Never  Diabetic?  no  Activities of Daily Living    10/17/2021    5:19 PM 04/01/2021   12:11 PM  In your present state of health, do you have any difficulty performing the following activities:  Hearing? 0   Vision? 0   Difficulty concentrating or making decisions? 0   Walking or climbing stairs? 1   Dressing or bathing? 0   Doing errands, shopping? 0 0  Preparing Food and eating ? N   Using the Toilet? N   In the past six months, have you accidently leaked urine? N   Do you have problems with loss of bowel control? N   Managing your Medications? N   Managing your Finances? N   Housekeeping or managing your Housekeeping? N     Patient Care Team: [name], MD as PCP - General (Family Medicine) Leonie Man, MD as PCP - Cardiology (Cardiology) Leonie Man, MD as Consulting Physician (Cardiology) Wilhelmina Mcardle, MD (Inactive) as Consulting Physician (Pulmonary Disease)  Indicate any recent Medical Services you may have received from other than [facility] providers in the past year (date may be approximate).     Assessment:   This is a routine wellness examination for Alexander [name].  Hearing/Vision screen No results found.  Dietary issues and exercise activities discussed:     Goals Addressed   None    Depression Screen    10/18/2020    2:58 PM 11/24/2018    9:19 AM 11/04/2017   10:37 AM 10/30/2016    1:36 PM 10/29/2015    1:45 PM 07/12/2014   11:13 AM 06/29/2013   11:36 AM  PHQ 2/9 Scores  PHQ - 2 Score 0 0 0 0 0 0 0  PHQ- 9 Score  0 5        Fall Risk    10/17/2021    5:19 PM 10/18/2020    2:57 PM 11/24/2018    9:19 AM 11/04/2017   10:37 AM 12/21/2016    5:08 PM  Fall Risk   Falls in the past year? 0 0 0 Yes No  Comment     Emmi Telephone Survey: data to providers prior to load  Number falls in  past yr:    1   Injury with Fall?    Yes     FALL RISK PREVENTION PERTAINING TO THE HOME:  Any stairs in or around the home? No  If so, are there any without handrails? No  Home free of loose throw rugs in walkways, pet beds, electrical cords, etc? Yes  Adequate lighting in your home to reduce risk of falls? Yes   ASSISTIVE DEVICES UTILIZED TO PREVENT FALLS:  Life alert? No  Use of a cane, walker or w/c? Yes  Grab bars in the bathroom? Yes  Shower chair or bench in shower? Yes  Elevated toilet seat or a handicapped toilet? Yes   TIMED UP AND GO:  Was the test performed? No .    Cognitive Function:    11/24/2018    9:19 AM 11/04/2017   10:40 AM 10/30/2016    1:35 PM 10/29/2015    1:47 PM  MMSE - Mini Mental State Exam  Orientation to time '5 5 5 5  '$ Orientation to Place '5 5 5 5  '$ Registration '3 3 3 3  '$ Attention/ Calculation 0 0 0 0  Recall '3 2 3 3  '$ Recall-comments  unable to recall 1 of 3 words    Language- name 2 objects 0 0 0 0  Language- repeat '1 1 1 1  '$ Language- follow 3 step command 0 '3 3 3  '$ Language- read & follow direction 0 0 0 0  Write a sentence 0 0 0 0  Copy design 0 0 0 0  Total score '17 19 20 20        '$ Immunizations Immunization History  Administered Date(s) Administered   Influenza, High Dose Seasonal PF 03/04/2016, 02/21/2018, 02/23/2019, 03/11/2021   Influenza, Seasonal, Injecte, Preservative Fre 05/03/2012, 05/05/2014, 02/28/2015   Influenza,inj,Quad PF,6+ Mos 02/27/2013   Influenza-Unspecified 03/17/2017   PFIZER Comirnaty(Gray Top)Covid-19 Tri-Sucrose Vaccine 03/23/2020, 09/28/2020   PFIZER(Purple Top)SARS-COV-2 Vaccination 08/25/2019, 09/19/2019   Pneumococcal Conjugate-13 04/28/2015   Pneumococcal Polysaccharide-23 10/30/2016   Td 05/03/2012   Zoster, Live 06/29/2013    TDAP status: Up to date  Flu Vaccine status: Up to date  Pneumococcal vaccine status: Up to date  Covid-19 vaccine status: Information provided on how to obtain  vaccines.   Qualifies for Shingles Vaccine? Yes   Zostavax completed Yes   Shingrix Completed?: No.    Education has been provided regarding the importance of this vaccine. Patient has been advised to call insurance company to determine out of pocket expense if they have not yet received this vaccine. Advised may also receive vaccine at local pharmacy or Health Dept. Verbalized acceptance and understanding.  Screening Tests Health Maintenance  Topic Date Due   Zoster Vaccines- Shingrix (1 of 2) Never done   COVID-19 Vaccine (5 - Booster for Pfizer series) 11/23/2020   INFLUENZA VACCINE  12/30/2021   TETANUS/TDAP  05/03/2022   COLONOSCOPY (Pts 45-70yr Insurance coverage will need to be confirmed)  06/07/2023   Pneumonia Vaccine 72 Years old  Completed   Hepatitis C Screening  Completed   HPV VACCINES  Aged Out    Health Maintenance  Health Maintenance Due  Topic Date Due   Zoster Vaccines- Shingrix (1 of 2) Never done   COVID-19 Vaccine (5 - Booster for Pfizer series) 11/23/2020    Colorectal cancer screening: Type of screening: Colonoscopy. Completed 2020. Repeat every 5 years  Lung Cancer Screening: (Low Dose CT Chest recommended if Age 72-80years, 30 pack-year currently smoking OR have quit w/in 15years.) does not qualify.   Lung Cancer Screening Referral:   Additional Screening:  Hepatitis C Screening: does not qualify; Completed   Vision Screening: Recommended annual ophthalmology exams for early detection of glaucoma and other disorders of the eye. Is the patient up to date with their annual eye exam?  Yes  Who is the provider or what is the name of the office in which the patient attends annual eye exams? [facility] on [address] If pt is not established with a provider, would they like to be referred to a provider to establish care? No .   Dental Screening: Recommended annual dental exams for proper oral hygiene  Community Resource Referral / Chronic Care  Management: CRR required this visit?  No   CCM required this visit?  No      Plan:     I have personally reviewed and noted the following in the patient's chart:   Medical and social history Use of alcohol, tobacco or illicit drugs  Current medications and supplements including opioid prescriptions. Patient is not currently taking opioid prescriptions. Functional ability and status Nutritional status Physical activity Advanced directives List of other physicians Hospitalizations, surgeries, and ER visits in previous 12 months Vitals Screenings to include cognitive, depression, and falls Referrals and appointments  In addition,  I have reviewed and discussed with patient certain preventive protocols, quality metrics, and best practice recommendations. A written personalized care plan for preventive services as well as general preventive health recommendations were provided to patient.     Leroy Kennedy, LPN   [id number]   Nurse Notes:

## 2021-10-24 ENCOUNTER — Encounter: Payer: Self-pay | Admitting: Family Medicine

## 2021-10-24 ENCOUNTER — Ambulatory Visit (INDEPENDENT_AMBULATORY_CARE_PROVIDER_SITE_OTHER): Payer: Medicare Other | Admitting: Family Medicine

## 2021-10-24 ENCOUNTER — Other Ambulatory Visit: Payer: Self-pay | Admitting: Family Medicine

## 2021-10-24 VITALS — BP 122/70 | HR 81 | Temp 97.9°F | Ht 71.0 in | Wt 220.4 lb

## 2021-10-24 DIAGNOSIS — N3941 Urge incontinence: Secondary | ICD-10-CM | POA: Diagnosis not present

## 2021-10-24 DIAGNOSIS — I10 Essential (primary) hypertension: Secondary | ICD-10-CM

## 2021-10-24 DIAGNOSIS — R32 Unspecified urinary incontinence: Secondary | ICD-10-CM | POA: Insufficient documentation

## 2021-10-24 DIAGNOSIS — E559 Vitamin D deficiency, unspecified: Secondary | ICD-10-CM

## 2021-10-24 DIAGNOSIS — R3912 Poor urinary stream: Secondary | ICD-10-CM

## 2021-10-24 DIAGNOSIS — R7303 Prediabetes: Secondary | ICD-10-CM

## 2021-10-24 DIAGNOSIS — N400 Enlarged prostate without lower urinary tract symptoms: Secondary | ICD-10-CM | POA: Insufficient documentation

## 2021-10-24 DIAGNOSIS — R300 Dysuria: Secondary | ICD-10-CM

## 2021-10-24 DIAGNOSIS — N401 Enlarged prostate with lower urinary tract symptoms: Secondary | ICD-10-CM

## 2021-10-24 DIAGNOSIS — E785 Hyperlipidemia, unspecified: Secondary | ICD-10-CM | POA: Diagnosis not present

## 2021-10-24 DIAGNOSIS — G14 Postpolio syndrome: Secondary | ICD-10-CM

## 2021-10-24 LAB — CBC WITH DIFFERENTIAL/PLATELET
Basophils Absolute: 0 10*3/uL (ref 0.0–0.1)
Basophils Relative: 0.3 % (ref 0.0–3.0)
Eosinophils Absolute: 0.5 10*3/uL (ref 0.0–0.7)
Eosinophils Relative: 5.4 % — ABNORMAL HIGH (ref 0.0–5.0)
HCT: 42.4 % (ref 39.0–52.0)
Hemoglobin: 14.5 g/dL (ref 13.0–17.0)
Lymphocytes Relative: 30.7 % (ref 12.0–46.0)
Lymphs Abs: 2.7 10*3/uL (ref 0.7–4.0)
MCHC: 34.2 g/dL (ref 30.0–36.0)
MCV: 96 fl (ref 78.0–100.0)
Monocytes Absolute: 0.6 10*3/uL (ref 0.1–1.0)
Monocytes Relative: 6.8 % (ref 3.0–12.0)
Neutro Abs: 5 10*3/uL (ref 1.4–7.7)
Neutrophils Relative %: 56.8 % (ref 43.0–77.0)
Platelets: 232 10*3/uL (ref 150.0–400.0)
RBC: 4.41 Mil/uL (ref 4.22–5.81)
RDW: 13.3 % (ref 11.5–15.5)
WBC: 8.9 10*3/uL (ref 4.0–10.5)

## 2021-10-24 LAB — POC URINALSYSI DIPSTICK (AUTOMATED)
Glucose, UA: NEGATIVE
Ketones, UA: NEGATIVE
Leukocytes, UA: NEGATIVE
Nitrite, UA: NEGATIVE
Protein, UA: POSITIVE — AB
Spec Grav, UA: 1.02 (ref 1.010–1.025)
Urobilinogen, UA: 0.2 E.U./dL
pH, UA: 6 (ref 5.0–8.0)

## 2021-10-24 LAB — VITAMIN D 25 HYDROXY (VIT D DEFICIENCY, FRACTURES): VITD: 35.05 ng/mL (ref 30.00–100.00)

## 2021-10-24 LAB — LIPID PANEL
Cholesterol: 109 mg/dL (ref 0–200)
HDL: 45.1 mg/dL (ref >39.00)
LDL Cholesterol: 42 mg/dL (ref 0–99)
NonHDL: 64.18
Total CHOL/HDL Ratio: 2
Triglycerides: 112 mg/dL (ref 0.0–149.0)
VLDL: 22.4 mg/dL (ref 0.0–40.0)

## 2021-10-24 LAB — COMPREHENSIVE METABOLIC PANEL
ALT: 17 U/L (ref 0–53)
AST: 17 U/L (ref 0–37)
Albumin: 4.3 g/dL (ref 3.5–5.2)
Alkaline Phosphatase: 50 U/L (ref 39–117)
BUN: 19 mg/dL (ref 6–23)
CO2: 31 mEq/L (ref 19–32)
Calcium: 9.3 mg/dL (ref 8.4–10.5)
Chloride: 105 mEq/L (ref 96–112)
Creatinine, Ser: 0.58 mg/dL (ref 0.40–1.50)
GFR: 98.03 mL/min (ref >60.00)
Glucose, Bld: 97 mg/dL (ref 70–99)
Potassium: 4.3 mEq/L (ref 3.5–5.1)
Sodium: 141 mEq/L (ref 135–145)
Total Bilirubin: 0.7 mg/dL (ref 0.2–1.2)
Total Protein: 7.2 g/dL (ref 6.0–8.3)

## 2021-10-24 LAB — HEMOGLOBIN A1C: Hgb A1c MFr Bld: 5.8 % (ref 4.6–6.5)

## 2021-10-24 LAB — PSA: PSA: 1.62 ng/mL (ref 0.10–4.00)

## 2021-10-24 LAB — TSH: TSH: 0.74 u[IU]/mL (ref 0.35–5.50)

## 2021-10-24 MED ORDER — TAMSULOSIN HCL 0.4 MG PO CAPS: 0.4000 mg | ORAL_CAPSULE | Freq: Every day | ORAL | 3 refills | Status: DC

## 2021-10-24 NOTE — Progress Notes (Signed)
Ordering fasting labs for upcoming CPE.

## 2021-10-24 NOTE — Assessment & Plan Note (Addendum)
Describes urinary incontinence with symptoms likely attributable to BPH - frequency, urgency, nocturia, dribbling, weak stream, and incomplete emptying.  Rx flomax 0.'4mg'$  nightly, watch for orthostasis.  Consider finasteride vs uro referral to discuss further eval/surgical management.  IPSS = 33-5 - severe symptoms

## 2021-10-24 NOTE — Progress Notes (Signed)
Patient ID: Alexander [name], male    DOB: [birth year], 71 y.o.   MRN: [id number]  This visit was conducted in person.  BP 122/70   Pulse 81   Temp 97.9 F (36.6 C) (Temporal)   Ht '5\' 11"'$  (1.803 m)   Wt 220 lb 6 oz (100 kg)   SpO2 94%   BMI 30.74 kg/m    CC: "I have to pee all the time" Subjective:   HPI: Alexander [name] is a 71 y.o. male presenting on 10/24/2021 for Urinary Frequency (C/o urinating often and pain with urination. Sxs started yrs ago, worsening. Accompanied by wife, Alexander [name]. )   Longstanding history (20 yrs) of frequency, urgency, nocturia x2-3, recently worsening so he decided to seek care. Dysuria if he holds urine too long. Some dribbling, weak stream, incomplete emptying.  Some urge incontinence. No stress incontinence symptoms. He doesn't use pad/depends.  Also notes dehydration worsens symptoms.   Drinks 14 oz coffee in the morning, also drinks sips of diet pepsi throughout the day. Does like spicy foods. Drinking water well.   Lab Results  Component Value Date   PSA 1.38 10/11/2020   PSA 0.94 11/24/2018   PSA 1.38 11/04/2017   Requests fasting labs today for next week's physical.      Relevant past medical, surgical, family and social history reviewed and updated as indicated. Interim medical history since our last visit reviewed. Allergies and medications reviewed and updated. Outpatient Medications Prior to Visit  Medication Sig Dispense Refill   acetaminophen (TYLENOL) 500 MG tablet Take 1,000 mg by mouth every 6 (six) hours as needed for moderate pain or headache.      Ascorbic Acid (VITAMIN C) 1000 MG tablet Take 1,000 mg by mouth daily.     aspirin EC 81 MG tablet Take 81 mg by mouth daily. Swallow whole.     Carboxymethylcellulose Sodium (LUBRICANT EYE DROPS OP) Place 2 drops into both eyes daily as needed (for dry eyes).     Cholecalciferol (VITAMIN D3) 50 MCG (2000 UT) TABS Take 2,000 Units by mouth daily.     gabapentin (NEURONTIN) 600 MG  tablet TAKE 2 TABLETS BY MOUTH 3  TIMES DAILY 540 tablet 1   ibuprofen (ADVIL) 200 MG tablet Take 400 mg by mouth every 6 (six) hours as needed for headache or moderate pain.     Magnesium 250 MG TABS Take 1 tablet (250 mg total) by mouth at bedtime. (Patient taking differently: Take 250 mg by mouth at bedtime.)  0   Multiple Vitamin (MULTIVITAMIN WITH MINERALS) TABS tablet Take 1 tablet by mouth daily.     Omega-3 Fatty Acids (FISH OIL) 1000 MG CAPS Take 1,000 mg by mouth daily.      omeprazole (PRILOSEC) 40 MG capsule Take 1 capsule (40 mg total) by mouth 2 (two) times daily before a meal. 60 capsule 6   rosuvastatin (CRESTOR) 40 MG tablet Take 1 tablet (40 mg total) by mouth daily. 90 tablet 3   sildenafil (VIAGRA) 100 MG tablet TAKE 1/2 TO 1 TABLET BY MOUTH DAILY AS NEEDED FOR ERECTILE DYSFUNCTION (Patient taking differently: Take 50-100 mg by mouth daily as needed for erectile dysfunction.) 6 tablet 0   vitamin B-12 (CYANOCOBALAMIN) 1000 MCG tablet Take 1,000 mcg by mouth daily.     vitamin E 400 UNIT capsule Take 400 Units by mouth daily.     No facility-administered medications prior to visit.     Per HPI unless specifically indicated  in ROS section below Review of Systems  Objective:  BP 122/70   Pulse 81   Temp 97.9 F (36.6 C) (Temporal)   Ht '5\' 11"'$  (1.803 m)   Wt 220 lb 6 oz (100 kg)   SpO2 94%   BMI 30.74 kg/m   Wt Readings from Last 3 Encounters:  10/24/21 220 lb 6 oz (100 kg)  10/13/21 202 lb 12.8 oz (92 kg)  09/15/21 203 lb (92.1 kg)      Physical Exam Vitals and nursing note reviewed.  Constitutional:      Appearance: Normal appearance. He is not ill-appearing.     Comments: Ambulates with forearm crutches  Abdominal:     General: Bowel sounds are normal. There is no distension.     Palpations: Abdomen is soft. There is no mass.     Tenderness: There is no abdominal tenderness. There is no guarding or rebound.     Hernia: No hernia is present.   Genitourinary:    Prostate: Enlarged (mild). Not tender and no nodules present.     Rectum: No mass, tenderness, anal fissure, external hemorrhoid or internal hemorrhoid. Abnormal anal tone (mild decrease).  Skin:    General: Skin is warm and dry.  Neurological:     Mental Status: He is alert.      Results for orders placed or performed in visit on 10/24/21  POCT Urinalysis Dipstick (Automated)  Result Value Ref Range   Color, UA dark yellow    Clarity, UA clear    Glucose, UA Negative Negative   Bilirubin, UA 1+    Ketones, UA negative    Spec Grav, UA 1.020 1.010 - 1.025   Blood, UA +/-    pH, UA 6.0 5.0 - 8.0   Protein, UA Positive (A) Negative   Urobilinogen, UA 0.2 0.2 or 1.0 E.U./dL   Nitrite, UA negative    Leukocytes, UA Negative Negative    Assessment & Plan:   Problem List Items Addressed This Visit     Post-polio syndrome (Chronic)   Hyperlipidemia with target LDL less than 70 (Chronic)   Essential hypertension (Chronic)   Prediabetes   Relevant Orders   Microalbumin / creatinine urine ratio   Vitamin D deficiency   BPH (benign prostatic hyperplasia) - Primary    Describes urinary incontinence with symptoms likely attributable to BPH - frequency, urgency, nocturia, dribbling, weak stream, and incomplete emptying.  Rx flomax 0.'4mg'$  nightly, watch for orthostasis.  Consider finasteride vs uro referral to discuss further eval/surgical management.  IPSS = 33-5 - severe symptoms       Relevant Medications   tamsulosin (FLOMAX) 0.4 MG CAPS capsule   Other Relevant Orders   POCT Urinalysis Dipstick (Automated) (Completed)   Urinary incontinence    Anticipate urge incontinence related to BPH and bladder outlet obstruction vs overactive bladder, r/o overflow incontinence.  Start flomax as per above.  Reassess at CPE next week.  ?how much post-polio weakness contributes to bladder issues - doubt this is the case.        Relevant Medications   tamsulosin  (FLOMAX) 0.4 MG CAPS capsule   Other Relevant Orders   POCT Urinalysis Dipstick (Automated) (Completed)     Meds ordered this encounter  Medications   tamsulosin (FLOMAX) 0.4 MG CAPS capsule    Sig: Take 1 capsule (0.4 mg total) by mouth daily.    Dispense:  30 capsule    Refill:  3   Orders Placed This Encounter  Procedures   Microalbumin / creatinine urine ratio    Standing Status:   Future    Standing Expiration Date:   09/28/2022   POCT Urinalysis Dipstick (Automated)    Patient instructions: Urinary symptoms likely stemming from enlarging prostate (BPH).  Take flomax 0.'4mg'$  nightly, update [place] with effect.  Urine today looking overall ok.  Follow up plan: Return if symptoms worsen or fail to improve.  [name], MD

## 2021-10-24 NOTE — Assessment & Plan Note (Addendum)
Anticipate urge incontinence related to BPH and bladder outlet obstruction vs overactive bladder, r/o overflow incontinence.  Start flomax as per above.  Reassess at CPE next week.  ?how much post-polio weakness contributes to bladder issues - doubt this is the case.

## 2021-10-24 NOTE — Patient Instructions (Signed)
Urinary symptoms likely stemming from enlarging prostate (BPH).  Take flomax 0.'4mg'$  nightly, update [place] with effect.  Urine today looking overall ok.  Benign Prostatic Hyperplasia  Benign prostatic hyperplasia (BPH) is an enlarged prostate gland that is caused by the normal aging process. The prostate may get bigger as a man gets older. The condition is not caused by cancer. The prostate is a walnut-sized gland that is involved in the production of semen. It is located in front of the rectum and below the bladder. The bladder stores urine. The urethra carries stored urine out of the body. An enlarged prostate can press on the urethra. This can make it harder to pass urine. The buildup of urine in the bladder can cause infection. Back pressure and infection may progress to bladder damage and kidney (renal) failure. What are the causes? This condition is part of the normal aging process. However, not all men develop problems from this condition. If the prostate enlarges away from the urethra, urine flow will not be blocked. If it enlarges toward the urethra and compresses it, there will be problems passing urine. What increases the risk? This condition is more likely to develop in men older than 50 years. What are the signs or symptoms? Symptoms of this condition include: Getting up often during the night to urinate. Needing to urinate frequently during the day. Difficulty starting urine flow. Decrease in size and strength of your urine stream. Leaking (dribbling) after urinating. Inability to pass urine. This needs immediate treatment. Inability to completely empty your bladder. Pain when you pass urine. This is more common if there is also an infection. Urinary tract infection (UTI). How is this diagnosed? This condition is diagnosed based on your medical history, a physical exam, and your symptoms. Tests will also be done, such as: A post-void bladder scan. This measures any amount of urine  that may remain in your bladder after you finish urinating. A digital rectal exam. In a rectal exam, your health care provider checks your prostate by putting a lubricated, gloved finger into your rectum to feel the back of your prostate gland. This exam detects the size of your gland and any abnormal lumps or growths. An exam of your urine (urinalysis). A prostate specific antigen (PSA) screening. This is a blood test used to screen for prostate cancer. An ultrasound. This test uses sound waves to electronically produce a picture of your prostate gland. Your health care provider may refer you to a specialist in kidney and prostate diseases (urologist). How is this treated? Once symptoms begin, your health care provider will monitor your condition (active surveillance or watchful waiting). Treatment for this condition will depend on the severity of your condition. Treatment may include: Observation and yearly exams. This may be the only treatment needed if your condition and symptoms are mild. Medicines to relieve your symptoms, including: Medicines to shrink the prostate. Medicines to relax the muscle of the prostate. Surgery in severe cases. Surgery may include: Prostatectomy. In this procedure, the prostate tissue is removed completely through an open incision or with a laparoscope or robotics. Transurethral resection of the prostate (TURP). In this procedure, a tool is inserted through the opening at the tip of the penis (urethra). It is used to cut away tissue of the inner core of the prostate. The pieces are removed through the same opening of the penis. This removes the blockage. Transurethral incision (TUIP). In this procedure, small cuts are made in the prostate. This lessens the prostate's  pressure on the urethra. Transurethral microwave thermotherapy (TUMT). This procedure uses microwaves to create heat. The heat destroys and removes a small amount of prostate tissue. Transurethral needle  ablation (TUNA). This procedure uses radio frequencies to destroy and remove a small amount of prostate tissue. Interstitial laser coagulation ([place]). This procedure uses a laser to destroy and remove a small amount of prostate tissue. Transurethral electrovaporization (TUVP). This procedure uses electrodes to destroy and remove a small amount of prostate tissue. Prostatic urethral lift. This procedure inserts an implant to push the lobes of the prostate away from the urethra. Follow these instructions at home: Take over-the-counter and prescription medicines only as told by your health care provider. Monitor your symptoms for any changes. Contact your health care provider with any changes. Avoid drinking large amounts of liquid before going to bed or out in public. Avoid or reduce how much caffeine or alcohol you drink. Give yourself time when you urinate. Keep all follow-up visits. This is important. Contact a health care provider if: You have unexplained back pain. Your symptoms do not get better with treatment. You develop side effects from the medicine you are taking. Your urine becomes very dark or has a bad smell. Your lower abdomen becomes distended and you have trouble passing urine. Get help right away if: You have a fever or chills. You suddenly cannot urinate. You feel light-headed or very dizzy, or you faint. There are large amounts of blood or clots in your urine. Your urinary problems become hard to manage. You develop moderate to severe low back or flank pain. The flank is the side of your body between the ribs and the hip. These symptoms may be an emergency. Get help right away. Call 911. Do not wait to see if the symptoms will go away. Do not drive yourself to the hospital. Summary Benign prostatic hyperplasia (BPH) is an enlarged prostate that is caused by the normal aging process. It is not caused by cancer. An enlarged prostate can press on the urethra. This can make  it hard to pass urine. This condition is more likely to develop in men older than 50 years. Get help right away if you suddenly cannot urinate. This information is not intended to replace advice given to you by your health care provider. Make sure you discuss any questions you have with your health care provider. Document Revised: 12/04/2020 Document Reviewed: 12/04/2020 Elsevier Patient Education  [place].

## 2021-10-28 ENCOUNTER — Encounter: Payer: Self-pay | Admitting: Family Medicine

## 2021-10-28 ENCOUNTER — Ambulatory Visit: Payer: Medicare Other | Admitting: Family Medicine

## 2021-10-28 VITALS — BP 124/72 | HR 88 | Temp 97.7°F | Ht 69.5 in | Wt 224.4 lb

## 2021-10-28 DIAGNOSIS — K315 Obstruction of duodenum: Secondary | ICD-10-CM

## 2021-10-28 DIAGNOSIS — I480 Paroxysmal atrial fibrillation: Secondary | ICD-10-CM

## 2021-10-28 DIAGNOSIS — Z0000 Encounter for general adult medical examination without abnormal findings: Secondary | ICD-10-CM | POA: Diagnosis not present

## 2021-10-28 DIAGNOSIS — Z7189 Other specified counseling: Secondary | ICD-10-CM | POA: Diagnosis not present

## 2021-10-28 DIAGNOSIS — G43909 Migraine, unspecified, not intractable, without status migrainosus: Secondary | ICD-10-CM

## 2021-10-28 DIAGNOSIS — R7303 Prediabetes: Secondary | ICD-10-CM | POA: Diagnosis not present

## 2021-10-28 DIAGNOSIS — G14 Postpolio syndrome: Secondary | ICD-10-CM | POA: Diagnosis not present

## 2021-10-28 DIAGNOSIS — K219 Gastro-esophageal reflux disease without esophagitis: Secondary | ICD-10-CM

## 2021-10-28 DIAGNOSIS — E559 Vitamin D deficiency, unspecified: Secondary | ICD-10-CM | POA: Diagnosis not present

## 2021-10-28 DIAGNOSIS — E785 Hyperlipidemia, unspecified: Secondary | ICD-10-CM

## 2021-10-28 DIAGNOSIS — H6120 Impacted cerumen, unspecified ear: Secondary | ICD-10-CM | POA: Insufficient documentation

## 2021-10-28 DIAGNOSIS — Z9889 Other specified postprocedural states: Secondary | ICD-10-CM

## 2021-10-28 DIAGNOSIS — H6121 Impacted cerumen, right ear: Secondary | ICD-10-CM

## 2021-10-28 DIAGNOSIS — G4733 Obstructive sleep apnea (adult) (pediatric): Secondary | ICD-10-CM

## 2021-10-28 DIAGNOSIS — N401 Enlarged prostate with lower urinary tract symptoms: Secondary | ICD-10-CM

## 2021-10-28 DIAGNOSIS — E669 Obesity, unspecified: Secondary | ICD-10-CM

## 2021-10-28 DIAGNOSIS — N3941 Urge incontinence: Secondary | ICD-10-CM

## 2021-10-28 DIAGNOSIS — E66811 Obesity, class 1: Secondary | ICD-10-CM

## 2021-10-28 DIAGNOSIS — I10 Essential (primary) hypertension: Secondary | ICD-10-CM | POA: Diagnosis not present

## 2021-10-28 DIAGNOSIS — R3912 Poor urinary stream: Secondary | ICD-10-CM

## 2021-10-28 LAB — MICROALBUMIN / CREATININE URINE RATIO
Creatinine,U: 38.1 mg/dL
Microalb Creat Ratio: 1.8 mg/g (ref 0.0–30.0)
Microalb, Ur: <0.7 mg/dL (ref 0.0–1.9)

## 2021-10-28 NOTE — Assessment & Plan Note (Signed)
Advanced directive: scanned 07/2021. Wife [name] then son Alroy Dust are [place]. Doesn't want prolonged life support if terminal condition. Doesn't think would want tube feed. Wants living will followed.

## 2021-10-28 NOTE — Assessment & Plan Note (Signed)
Chronic, stable. Continue current regimen. 

## 2021-10-28 NOTE — Assessment & Plan Note (Signed)
Continue 2000 IU daily.  

## 2021-10-28 NOTE — Assessment & Plan Note (Signed)
Ongoing. Consider myrbetriq.

## 2021-10-28 NOTE — Assessment & Plan Note (Signed)
H/o this. I believe he is off treatment at this time.

## 2021-10-28 NOTE — Assessment & Plan Note (Signed)
S/p irrigation with benefit, patient tolerated well. This may have helped tinnitus and decreased hearing.

## 2021-10-28 NOTE — Assessment & Plan Note (Addendum)
Stable period based on A1c.

## 2021-10-28 NOTE — Assessment & Plan Note (Signed)
Stable period only on aspirin. Remains off anticoagulation.

## 2021-10-28 NOTE — Assessment & Plan Note (Signed)
Preventative protocols reviewed and updated unless pt declined. Discussed healthy diet and lifestyle.  

## 2021-10-28 NOTE — Assessment & Plan Note (Signed)
S/p dilation 08/2021

## 2021-10-28 NOTE — Assessment & Plan Note (Signed)
Continues omeprazole '40mg'$  BID.

## 2021-10-28 NOTE — Progress Notes (Signed)
Patient ID: Alexander [name], male    DOB: May 07, 1951, 71 y.o.   MRN: [id number]  This visit was conducted in person.  BP 124/72   Pulse 88   Temp 97.7 F (36.5 C) (Temporal)   Ht 5' 9.5" (1.765 m)   Wt 224 lb 6 oz (101.8 kg)   SpO2 94%   BMI 32.66 kg/m    CC: CPE Subjective:   HPI: Alexander [name] is a 71 y.o. male presenting on 10/28/2021 for Annual Exam (MCR prt 2.)   Saw health advisor last week for medicare wellness visit. Note reviewed. Cognitive assessment not performed  Hearing Screening   '500Hz'$  '1000Hz'$  '2000Hz'$  '4000Hz'$   Right ear 0 0 0 0  Left ear '20 20 20 20    '$ Flowsheet Row Clinical Support from 10/21/2021 in [place] at [facility] Total Score 0          10/21/2021    9:06 AM 10/17/2021    5:19 PM 10/18/2020    2:57 PM 11/24/2018    9:19 AM 11/04/2017   10:37 AM  Fall Risk   Falls in the past year? 0 0 0 0 Yes  Number falls in past yr: 0    1  Injury with Fall? 0    Yes  Risk for fall due to : Impaired balance/gait       Notes ongoing R ear tinnitus present for years, described as high pitched ring. No hearing changes.   Known post polio syndrome /poliomyelitis sequelae with residual lower extremity (L>R) weakness/atrophy on disability, uses fitted lower extremity braces from hips to toes bilaterally regularly. Notes bruising to R inner ankle possibly from brace (new since 06/2019).   H/o recurrent ascending cholangitis s/p ERCP for choledocholithiasis removal and duodenal stricture dilation latest 08/2021.   Seen last week with longstanding 20+ year history of lower urinary tract symptoms, recently worsening.  DRE overall reassuring, PSA normal.  IPSS = 33-5 consistent with severe symptoms.  Started on tamsulosin 0.4 mg nightly. Feels this may be helping urinary stream, decreasing nocturia.    Preventative: COLONOSCOPY WITH ESOPHAGOGASTRODUODENOSCOPY (EGD) 06/2018 - reactive gastropathy, tubular adenoma, rpt colonoscopy 5 yrs ([place]).   Prostate cancer screening - continues yearly screening. Nocturia x3-4, some improvement on recently started flomax.  Lung cancer screening - not eligible  Flu shot yearly [place] 07/2019, 08/2019, booster 03/2020, 08/2020, planning bivalent Td 2013 Prevnar-13 2016, pnuemovax 2018.  zostavax - 06/2013 shingrix - discussed. To check with pharmacy.  Advanced directive: scanned 07/2021. Wife Alexander [name] then son Alexander [name] are [place]. Doesn't want prolonged life support if terminal condition. Doesn't think would want tube feed. Wants living will followed.  Seat belt use discussed Sunscreen use discussed. No changing moles on skin.  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 Interim medical history since our last visit reviewed. Allergies and medications reviewed and updated. Outpatient Medications Prior  to Visit  Medication Sig Dispense Refill   acetaminophen (TYLENOL) 500 MG tablet Take 1,000 mg by mouth every 6 (six) hours as needed for moderate pain or headache.      Ascorbic Acid (VITAMIN C) 1000 MG tablet Take 1,000 mg by mouth daily.     aspirin EC 81 MG tablet Take 81 mg by mouth daily. Swallow whole.     Carboxymethylcellulose Sodium (LUBRICANT EYE DROPS OP) Place 2 drops into both eyes daily as needed (for dry eyes).     Cholecalciferol (VITAMIN D3) 50 MCG (2000 UT) TABS Take 2,000 Units by mouth daily.      gabapentin (NEURONTIN) 600 MG tablet TAKE 2 TABLETS BY MOUTH 3  TIMES DAILY 540 tablet 1   ibuprofen (ADVIL) 200 MG tablet Take 400 mg by mouth every 6 (six) hours as needed for headache or moderate pain.     Magnesium 250 MG TABS Take 1 tablet (250 mg total) by mouth at bedtime. (Patient taking differently: Take 250 mg by mouth at bedtime.)  0   Multiple Vitamin (MULTIVITAMIN WITH MINERALS) TABS tablet Take 1 tablet by mouth daily.     Omega-3 Fatty Acids (FISH OIL) 1000 MG CAPS Take 1,000 mg by mouth daily.      omeprazole (PRILOSEC) 40 MG capsule Take 1 capsule (40 mg total) by mouth 2 (two) times daily before a meal. 60 capsule 6   rosuvastatin (CRESTOR) 40 MG tablet Take 1 tablet (40 mg total) by mouth daily. 90 tablet 3   sildenafil (VIAGRA) 100 MG tablet TAKE 1/2 TO 1 TABLET BY MOUTH DAILY AS NEEDED FOR ERECTILE DYSFUNCTION (Patient taking differently: Take 50-100 mg by mouth daily as needed for erectile dysfunction.) 6 tablet 0   tamsulosin (FLOMAX) 0.4 MG CAPS capsule Take 1 capsule (0.4 mg total) by mouth daily. 30 capsule 3   vitamin B-12 (CYANOCOBALAMIN) 1000 MCG tablet Take 1,000 mcg by mouth daily.     vitamin E 400 UNIT capsule Take 400 Units by mouth daily.     No facility-administered medications prior to visit.     Per HPI unless specifically indicated in ROS section below Review of Systems  Constitutional:  Negative for activity change, appetite change, chills, fatigue, fever and unexpected weight change.  HENT:  Negative for hearing loss.   Eyes:  Negative for visual disturbance.  Respiratory:  Positive for shortness of breath and wheezing. Negative for cough and chest tightness.   Cardiovascular:  Negative for chest pain, palpitations and leg swelling.  Gastrointestinal:  Negative for abdominal distention, abdominal pain, blood in stool, constipation, diarrhea, nausea and vomiting.  Genitourinary:  Positive for difficulty urinating, frequency and urgency. Negative for  hematuria.       Nocturia, incomplete emptying with weakening of stream  Musculoskeletal:  Negative for arthralgias, myalgias and neck pain.  Skin:  Negative for rash.  Neurological:  Positive for headaches. Negative for dizziness, seizures and syncope.  Hematological:  Negative for adenopathy. Does not bruise/bleed easily.  Psychiatric/Behavioral:  Negative for dysphoric mood. The patient is not nervous/anxious.    Objective:  BP 124/72   Pulse 88   Temp 97.7 F (36.5 C) (Temporal)   Ht 5' 9.5" (1.765 m)   Wt 224 lb 6 oz (101.8 kg)   SpO2 94%   BMI 32.66 kg/m   Wt Readings from Last 3 Encounters:  10/28/21 224 lb 6 oz (101.8 kg)  10/24/21 220 lb 6 oz (100 kg)  10/13/21 202 lb 12.8 oz (92  kg)      Physical Exam Vitals and nursing note reviewed.  Constitutional:      General: He is not in acute distress.    Appearance: Normal appearance. He is well-developed. He is not ill-appearing.  HENT:     Head: Normocephalic and atraumatic.     Right Ear: Tympanic membrane, ear canal and external ear normal. Decreased hearing noted. There is impacted cerumen.     Left Ear: Hearing, tympanic membrane, ear canal and external ear normal.     Mouth/Throat:     Mouth: Mucous membranes are moist.     Pharynx: Oropharynx is clear. No oropharyngeal exudate or posterior oropharyngeal erythema.  Eyes:     General: No scleral icterus.    Extraocular Movements: Extraocular movements intact.     Conjunctiva/sclera: Conjunctivae normal.     Pupils: Pupils are equal, round, and reactive to light.  Neck:     Thyroid: No thyroid mass or thyromegaly.  Cardiovascular:     Rate and Rhythm: Normal rate and regular rhythm.     Pulses: Normal pulses.          Radial pulses are 2+ on the right side and 2+ on the left side.     Heart sounds: Normal heart sounds. No murmur heard. Pulmonary:     Effort: Pulmonary effort is normal. No respiratory distress.     Breath sounds: Normal breath sounds. No  wheezing, rhonchi or rales.  Abdominal:     General: Bowel sounds are normal. There is no distension.     Palpations: Abdomen is soft. There is no mass.     Tenderness: There is no abdominal tenderness. There is no guarding or rebound.     Hernia: No hernia is present.  Musculoskeletal:        General: Normal range of motion.     Cervical back: Normal range of motion and neck supple.     Right lower leg: No edema.     Left lower leg: No edema.  Lymphadenopathy:     Cervical: No cervical adenopathy.  Skin:    General: Skin is warm and dry.     Findings: No rash.  Neurological:     General: No focal deficit present.     Mental Status: He is alert and oriented to person, place, and time.     Comments:  Recall 3/3 Calculation 5/5 DLROW  Psychiatric:        Mood and Affect: Mood normal.        Behavior: Behavior normal.        Thought Content: Thought content normal.        Judgment: Judgment normal.      Results for orders placed or performed in visit on 10/24/21  CBC with Differential/Platelet  Result Value Ref Range   WBC 8.9 4.0 - 10.5 K/uL   RBC 4.41 4.22 - 5.81 Mil/uL   Hemoglobin 14.5 13.0 - 17.0 g/dL   HCT 42.4 39.0 - 52.0 %   MCV 96.0 78.0 - 100.0 fl   MCHC 34.2 30.0 - 36.0 g/dL   RDW 13.3 11.5 - 15.5 %   Platelets 232.0 150.0 - 400.0 K/uL   Neutrophils Relative % 56.8 43.0 - 77.0 %   Lymphocytes Relative 30.7 12.0 - 46.0 %   Monocytes Relative 6.8 3.0 - 12.0 %   Eosinophils Relative 5.4 (H) 0.0 - 5.0 %   Basophils Relative 0.3 0.0 - 3.0 %   Neutro Abs 5.0 1.4 -  7.7 K/uL   Lymphs Abs 2.7 0.7 - 4.0 K/uL   Monocytes Absolute 0.6 0.1 - 1.0 K/uL   Eosinophils Absolute 0.5 0.0 - 0.7 K/uL   Basophils Absolute 0.0 0.0 - 0.1 K/uL  PSA  Result Value Ref Range   PSA 1.62 0.10 - 4.00 ng/mL  Hemoglobin A1c  Result Value Ref Range   Hgb A1c MFr Bld 5.8 4.6 - 6.5 %  TSH  Result Value Ref Range   TSH 0.74 0.35 - 5.50 uIU/mL  Comprehensive metabolic panel  Result Value  Ref Range   Sodium 141 135 - 145 mEq/L   Potassium 4.3 3.5 - 5.1 mEq/L   Chloride 105 96 - 112 mEq/L   CO2 31 19 - 32 mEq/L   Glucose, Bld 97 70 - 99 mg/dL   BUN 19 6 - 23 mg/dL   Creatinine, Ser 0.58 0.40 - 1.50 mg/dL   Total Bilirubin 0.7 0.2 - 1.2 mg/dL   Alkaline Phosphatase 50 39 - 117 U/L   AST 17 0 - 37 U/L   ALT 17 0 - 53 U/L   Total Protein 7.2 6.0 - 8.3 g/dL   Albumin 4.3 3.5 - 5.2 g/dL   GFR 98.03 >60.00 mL/min   Calcium 9.3 8.4 - 10.5 mg/dL  Lipid panel  Result Value Ref Range   Cholesterol 109 0 - 200 mg/dL   Triglycerides 112.0 0.0 - 149.0 mg/dL   HDL 45.10 >39.00 mg/dL   VLDL 22.4 0.0 - 40.0 mg/dL   LDL Cholesterol 42 0 - 99 mg/dL   Total CHOL/HDL Ratio 2    NonHDL 64.18   VITAMIN D 25 Hydroxy (Vit-D Deficiency, Fractures)  Result Value Ref Range   VITD 35.05 30.00 - 100.00 ng/mL  POCT Urinalysis Dipstick (Automated)  Result Value Ref Range   Color, UA dark yellow    Clarity, UA clear    Glucose, UA Negative Negative   Bilirubin, UA 1+    Ketones, UA negative    Spec Grav, UA 1.020 1.010 - 1.025   Blood, UA +/-    pH, UA 6.0 5.0 - 8.0   Protein, UA Positive (A) Negative   Urobilinogen, UA 0.2 0.2 or 1.0 E.U./dL   Nitrite, UA negative    Leukocytes, UA Negative Negative      10/21/2021    9:10 AM 10/18/2020    2:58 PM 11/24/2018    9:19 AM 11/04/2017   10:37 AM 10/30/2016    1:36 PM  Depression screen PHQ 2/9  Decreased Interest 0 0 0 0 0  Down, Depressed, Hopeless 0 0 0 0 0  PHQ - 2 Score 0 0 0 0 0  Altered sleeping   0 2   Tired, decreased energy   0 2   Change in appetite   0 1   Feeling bad or failure about yourself    0 0   Trouble concentrating   0 0   Moving slowly or fidgety/restless   0 0   Suicidal thoughts   0 0   PHQ-9 Score   0 5   Difficult doing work/chores   Not difficult at all Somewhat difficult         View : No data to display.         Assessment & Plan:   Problem List Items Addressed This Visit     Post-polio  syndrome (Chronic)   Hyperlipidemia with target LDL less than 70 (Chronic)    Chronic, stable period  on rosuvastatin '40mg'$  daily - continue. The ASCVD Risk score (Arnett DK, et al., 2019) failed to calculate for the following reasons:   The patient has a prior MI or stroke diagnosis        Advanced care planning/counseling discussion (Chronic)    Advanced directive: scanned 07/2021. Wife Alexander [name] then son Alexander [name] are [place]. Doesn't want prolonged life support if terminal condition. Doesn't think would want tube feed. Wants living will followed.       Essential hypertension (Chronic)    Chronic, stable. Continue current regimen.        PAF (paroxysmal atrial fibrillation) ([place]); CHA2DS2VASc = 4--> Eliquis. (Chronic)    Stable period only on aspirin. Remains off anticoagulation.        Health maintenance examination - Primary (Chronic)    Preventative protocols reviewed and updated unless pt declined. Discussed healthy diet and lifestyle.        OSA (obstructive sleep apnea)    H/o this. I believe he is off treatment at this time.        Migraine    No recent migraine.        Prediabetes    Stable period based on A1c.        Vitamin D deficiency    Continue 2000 IU daily.        Obesity, Class I, BMI 30-34.9    Encouraged healthy diet and lifestyle choices to affect sustainable weight loss.        GERD (gastroesophageal reflux disease)    Continues omeprazole '40mg'$  BID.        Duodenal stenosis    S/p dilation 08/2021       S/P ERCP   BPH (benign prostatic hyperplasia)    Improvement noted on flomax 0.'4mg'$  nightly - continue this.  Discussed finasteride vs myrbetriq pending symptomatology if further treatment desired.        Urinary incontinence    Ongoing. Consider myrbetriq.        Hearing loss due to cerumen impaction    S/p irrigation with benefit, patient tolerated well. This may have helped tinnitus and decreased hearing.          No  orders of the defined types were placed in this encounter.  No orders of the defined types were placed in this encounter.   Patient instructions: If interested, check with pharmacy about new 2 shot shingles series (shingrix).  Ok to get bivalent COVID vaccine as well.  You are doing well today Keep me updated with urinary symptoms over the next few weeks, next step likely additional medicine depending on ongoing symptoms.  Return as needed or in 6 months for follow up visit.  Hearing screen today.   Follow up plan: Return in about 6 months (around 04/30/2022) for follow up visit.  [name], MD

## 2021-10-28 NOTE — Assessment & Plan Note (Signed)
Chronic, stable period on rosuvastatin '40mg'$  daily - continue. The ASCVD Risk score (Arnett DK, et al., 2019) failed to calculate for the following reasons:   The patient has a prior MI or stroke diagnosis

## 2021-10-28 NOTE — Patient Instructions (Addendum)
If interested, check with pharmacy about new 2 shot shingles series (shingrix).  Ok to get bivalent COVID vaccine as well.  You are doing well today Keep me updated with urinary symptoms over the next few weeks, next step likely additional medicine depending on ongoing symptoms.  Return as needed or in 6 months for follow up visit.  Hearing screen today.   Health Maintenance After Age 72 After age 56, you are at a higher risk for certain long-term diseases and infections as well as injuries from falls. Falls are a major cause of broken bones and head injuries in people who are older than age 56. Getting regular preventive care can help to keep you healthy and well. Preventive care includes getting regular testing and making lifestyle changes as recommended by your health care provider. Talk with your health care provider about: Which screenings and tests you should have. A screening is a test that checks for a disease when you have no symptoms. A diet and exercise plan that is right for you. What should I know about screenings and tests to prevent falls? Screening and testing are the best ways to find a health problem early. Early diagnosis and treatment give you the best chance of managing medical conditions that are common after age 56. Certain conditions and lifestyle choices may make you more likely to have a fall. Your health care provider may recommend: Regular vision checks. Poor vision and conditions such as cataracts can make you more likely to have a fall. If you wear glasses, make sure to get your prescription updated if your vision changes. Medicine review. Work with your health care provider to regularly review all of the medicines you are taking, including over-the-counter medicines. Ask your health care provider about any side effects that may make you more likely to have a fall. Tell your health care provider if any medicines that you take make you feel dizzy or sleepy. Strength and  balance checks. Your health care provider may recommend certain tests to check your strength and balance while standing, walking, or changing positions. Foot health exam. Foot pain and numbness, as well as not wearing proper footwear, can make you more likely to have a fall. Screenings, including: Osteoporosis screening. Osteoporosis is a condition that causes the bones to get weaker and break more easily. Blood pressure screening. Blood pressure changes and medicines to control blood pressure can make you feel dizzy. Depression screening. You may be more likely to have a fall if you have a fear of falling, feel depressed, or feel unable to do activities that you used to do. Alcohol use screening. Using too much alcohol can affect your balance and may make you more likely to have a fall. Follow these instructions at home: Lifestyle Do not drink alcohol if: Your health care provider tells you not to drink. If you drink alcohol: Limit how much you have to: 0-1 drink a day for women. 0-2 drinks a day for men. Know how much alcohol is in your drink. In the [place] one drink equals one 12 oz bottle of beer (355 mL), one 5 oz glass of wine (148 mL), or one 1 oz glass of hard liquor (44 mL). Do not use any products that contain nicotine or tobacco. These products include cigarettes, chewing tobacco, and vaping devices, such as e-cigarettes. If you need help quitting, ask your health care provider. Activity  Follow a regular exercise program to stay fit. This will help you maintain your balance. Ask  your health care provider what types of exercise are appropriate for you. If you need a cane or walker, use it as recommended by your health care provider. Wear supportive shoes that have nonskid soles. Safety  Remove any tripping hazards, such as rugs, cords, and clutter. Install safety equipment such as grab bars in bathrooms and safety rails on stairs. Keep rooms and walkways well-lit. General  instructions Talk with your health care provider about your risks for falling. Tell your health care provider if: You fall. Be sure to tell your health care provider about all falls, even ones that seem minor. You feel dizzy, tiredness (fatigue), or off-balance. Take over-the-counter and prescription medicines only as told by your health care provider. These include supplements. Eat a healthy diet and maintain a healthy weight. A healthy diet includes low-fat dairy products, low-fat (lean) meats, and fiber from whole grains, beans, and lots of fruits and vegetables. Stay current with your vaccines. Schedule regular health, dental, and eye exams. Summary Having a healthy lifestyle and getting preventive care can help to protect your health and wellness after age 31. Screening and testing are the best way to find a health problem early and help you avoid having a fall. Early diagnosis and treatment give you the best chance for managing medical conditions that are more common for people who are older than age 66. Falls are a major cause of broken bones and head injuries in people who are older than age 40. Take precautions to prevent a fall at home. Work with your health care provider to learn what changes you can make to improve your health and wellness and to prevent falls. This information is not intended to replace advice given to you by your health care provider. Make sure you discuss any questions you have with your health care provider. Document Revised: 10/07/2020 Document Reviewed: 10/07/2020 Elsevier Patient Education  [place].

## 2021-10-28 NOTE — Assessment & Plan Note (Signed)
Improvement noted on flomax 0.'4mg'$  nightly - continue this.  Discussed finasteride vs myrbetriq pending symptomatology if further treatment desired.

## 2021-10-28 NOTE — Assessment & Plan Note (Signed)
No recent migraine.

## 2021-10-28 NOTE — Assessment & Plan Note (Signed)
Encouraged healthy diet and lifestyle choices to affect sustainable weight loss.  ?

## 2021-11-02 ENCOUNTER — Encounter: Payer: Self-pay | Admitting: Cardiology

## 2021-11-02 NOTE — Assessment & Plan Note (Signed)
He does take a long time to recover from hospitalization.  My hope only year ago was to put him on low-dose beta-blocker for cardiovascular benefit and stopping his ACE inhibitor due to orthostatic dizziness and hypotension.  Unfortunately, partly because of all of his hospitalizations and illnesses, he was not able to tolerate even low-dose beta-blocker. Being off the beta-blocker his energy level is notably improved.  [name] has a specific reason to be on beta-blocker, we will continue to hold.

## 2021-11-02 NOTE — Assessment & Plan Note (Signed)
Status post PCI to all 3 major arteries.  Widely patent by cath in 2016.  Has not had any anginal symptoms since then.  Echocardiogram last year showed normal EF with no wall motion abnormalities.  No longer on beta-blocker or ACE inhibitor because of hypotension and fatigue. He is on aspirin and statin.  Doing well.

## 2021-11-02 NOTE — Assessment & Plan Note (Signed)
Has been stable on 40 mg rosuvastatin.  Seems to be tolerating it relatively well.  Last lipids were from a year ago demonstrating excellent control.  LDL was 50..    We ordered them to be done this month.-  Addendum: New labs reviewed.  LDL 42 and LDL of 109.  Outstanding control.  If he were to have any more issues with fatigue, would probably back down to 20 mg rosuvastatin.

## 2021-11-02 NOTE — Assessment & Plan Note (Signed)
Has not had any further issues to suspect that he has had A-fib other than when he was sick having cholecystectomy sepsis.  Has been many years without any episodes.  No longer on DOAC.  No longer on rate control agent because of bradycardia and fatigue.

## 2021-11-02 NOTE — Assessment & Plan Note (Addendum)
Blood pressure is pretty well controlled on no medications anymore.  No longer taking metoprolol.  We stopped his ACE inhibitor last visit.

## 2021-11-02 NOTE — Assessment & Plan Note (Addendum)
Notably improved after stopping ACE inhibitor.  Low-dose beta-blocker eventually discontinued due to fatigue.  Now not on any medications.  Stressed importance of adequate hydration.  His lack of muscle strength in the legs could well lead to venous pooling.  Compression stockings.

## 2021-11-08 ENCOUNTER — Other Ambulatory Visit: Payer: Self-pay | Admitting: Family Medicine

## 2021-11-08 ENCOUNTER — Other Ambulatory Visit: Payer: Self-pay | Admitting: Cardiology

## 2021-11-10 ENCOUNTER — Telehealth: Payer: Self-pay

## 2021-11-10 NOTE — Telephone Encounter (Signed)
Patient called in stating he would like a call back from Dr.G's CMA in regards to medication. Wouldn't provide much information to me other than its about his baldder medication.

## 2021-11-10 NOTE — Telephone Encounter (Addendum)
Glad flomax is helping - continue flomax at this time. Does he feel like he's doing well enough with flomax alone?  I don't remember discussing anxiety medication - what issue is he having with anxiety at this time?

## 2021-11-10 NOTE — Telephone Encounter (Signed)
Spoke to patient by telephone and was advised that he was to call back in 2 weeks and let Dr. [name] know how he was doing. Patient stated that his urine flow is good now. Patient stated that he was told that Dr. [name] would prescribe something to help with his anxiety when he called back. Pharmacy [facility]

## 2021-11-11 ENCOUNTER — Telehealth: Payer: Self-pay | Admitting: Family Medicine

## 2021-11-11 MED ORDER — MIRABEGRON ER 25 MG PO TB24: 25.0000 mg | ORAL_TABLET | Freq: Every day | ORAL | 3 refills | Status: DC

## 2021-11-11 NOTE — Telephone Encounter (Signed)
Patient returning call. Please call back

## 2021-11-11 NOTE — Addendum Note (Signed)
Addended by: [name] on: 11/11/2021 01:56 PM   Modules accepted: Orders

## 2021-11-11 NOTE — Telephone Encounter (Signed)
Robyn Printy (Spouse) came into the office to drop off a disability parking placard paperwork. Spouse just wants Dr. Darnell Level to fill out this paperwork for her husband, I will place it in his box. Please advise, thanks.  Callback Number: [phone]

## 2021-11-11 NOTE — Telephone Encounter (Signed)
Signed and in Lisa's box.

## 2021-11-11 NOTE — Telephone Encounter (Signed)
See other phone note regarding medication. Alexander [name] has the form to be completed and patient will be advised when it is ready to be picked up

## 2021-11-11 NOTE — Telephone Encounter (Signed)
Patient notified as instructed by telephone and verbalized understanding. 

## 2021-11-11 NOTE — Telephone Encounter (Signed)
Disregard call back msg.   Placed form in Dr. Synthia Innocent box.

## 2021-11-11 NOTE — Telephone Encounter (Signed)
Spoke with pt relaying Dr. Synthia Innocent message.  Pt states he feels the Flomax alone is helping.  However, the anxiety or anxious feeling come when he gets the urge to go and is not close to a restroom.  He gets anxious about trying to make it without having an "accident". Also, pt mentioned you all discussed a med to shrink bladder or med to help with feeling to go. Says he was a little confused about that.

## 2021-11-11 NOTE — Telephone Encounter (Signed)
Left message on voicemail for patient to call the office back. 

## 2021-11-11 NOTE — Telephone Encounter (Addendum)
He can price out myrbetriq '25mg'$  daily medicine to help decrease urge to go.  Sent to pharmacy Watch BP on this medicine, stop if he develops any difficulty urinating.

## 2021-11-12 NOTE — Telephone Encounter (Signed)
Spoke with pt notifying him form is ready to pick up.  Expresses his thanks.  [Placed form at front office.]

## 2021-11-12 NOTE — Telephone Encounter (Signed)
Pt wife picked up

## 2021-11-14 ENCOUNTER — Other Ambulatory Visit: Payer: Self-pay | Admitting: Cardiology

## 2021-11-14 ENCOUNTER — Other Ambulatory Visit: Payer: Self-pay | Admitting: Family Medicine

## 2021-12-16 ENCOUNTER — Encounter: Payer: Self-pay | Admitting: Family Medicine

## 2021-12-16 DIAGNOSIS — I10 Essential (primary) hypertension: Secondary | ICD-10-CM

## 2021-12-16 DIAGNOSIS — E785 Hyperlipidemia, unspecified: Secondary | ICD-10-CM

## 2021-12-16 DIAGNOSIS — G14 Postpolio syndrome: Secondary | ICD-10-CM

## 2021-12-16 DIAGNOSIS — N3941 Urge incontinence: Secondary | ICD-10-CM

## 2021-12-19 ENCOUNTER — Encounter: Payer: Self-pay | Admitting: Family Medicine

## 2021-12-22 NOTE — Telephone Encounter (Signed)
FYI - he will not qualify for Myrbetriq patient assistance - they only allow uninsured patients into their program.  I also saw he got a coupon card, I am doubtful that this will work with his [employer], typically those cards only work with Pharmacist, community.

## 2021-12-22 NOTE — Addendum Note (Signed)
Addended by: [name] on: 12/22/2021 07:59 AM   Modules accepted: Orders

## 2022-01-27 ENCOUNTER — Other Ambulatory Visit: Payer: Self-pay | Admitting: Family Medicine

## 2022-02-10 ENCOUNTER — Other Ambulatory Visit: Payer: Self-pay | Admitting: Gastroenterology

## 2022-03-09 ENCOUNTER — Other Ambulatory Visit: Payer: Self-pay | Admitting: Family Medicine

## 2022-03-10 ENCOUNTER — Other Ambulatory Visit: Payer: Self-pay | Admitting: Family Medicine

## 2022-03-20 IMAGING — RF DG ERCP WO/W SPHINCTEROTOMY
1 series · 5 of 5 positions shown · non-contrast
Comparison: CT and ultrasound studies on 03/31/2021

CLINICAL DATA: Biliary dilatation, cholangitis and
choledocholithiasis.

EXAM:
ERCP
TECHNIQUE: Multiple spot images obtained with the fluoroscopic device and
submitted for interpretation post-procedure.

[Series 1: unknown protocol · 0.20mm/px · 5 of 5 slices shown]
[im 1/5]
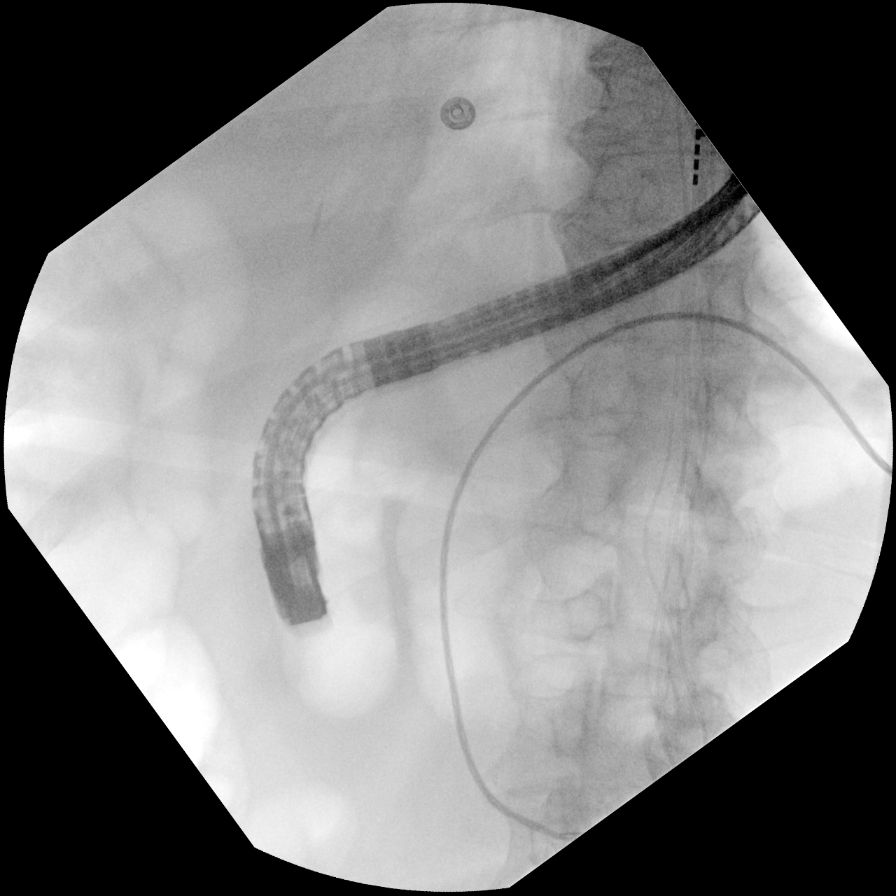
[im 2/5]
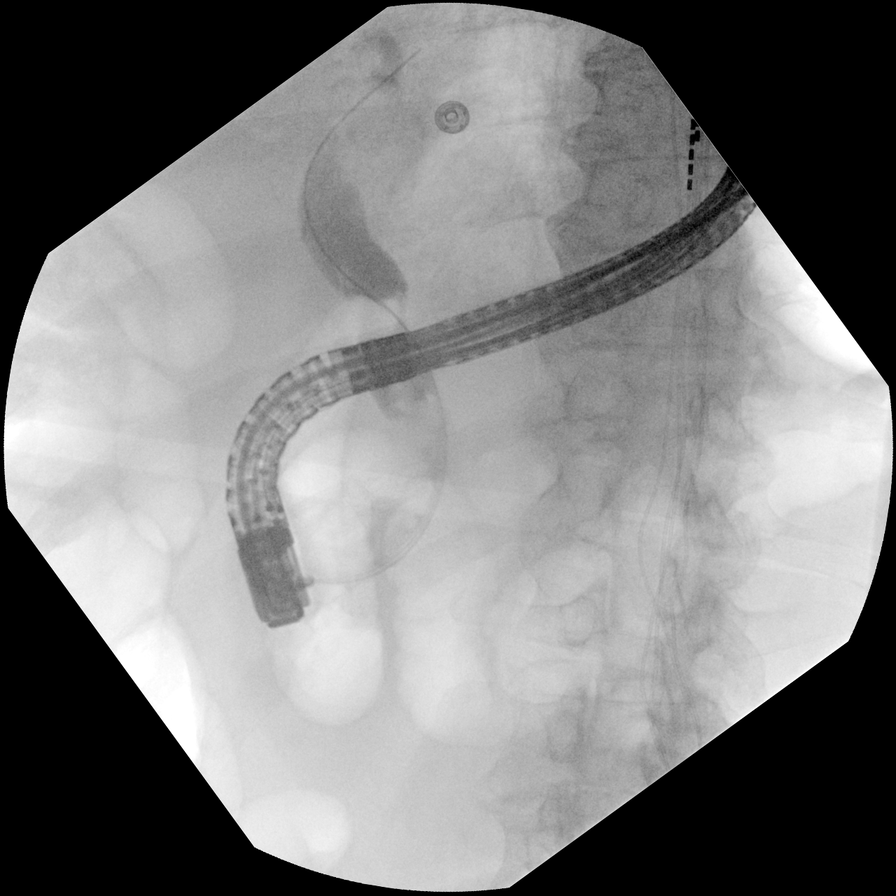
[im 3/5]
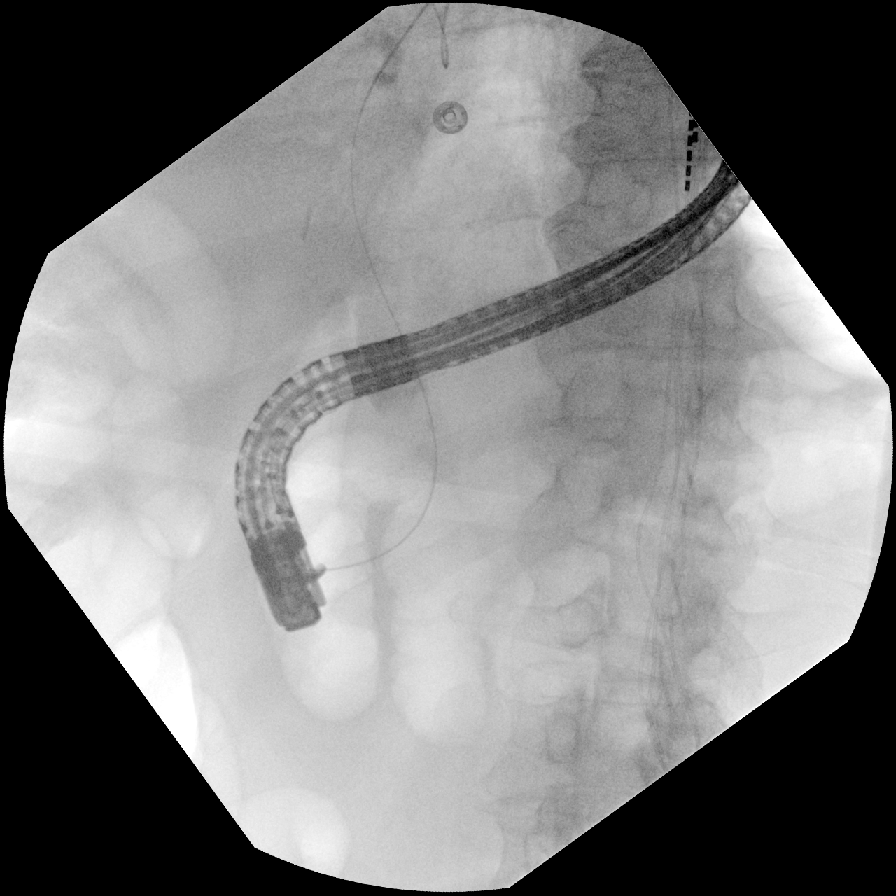
[im 4/5]
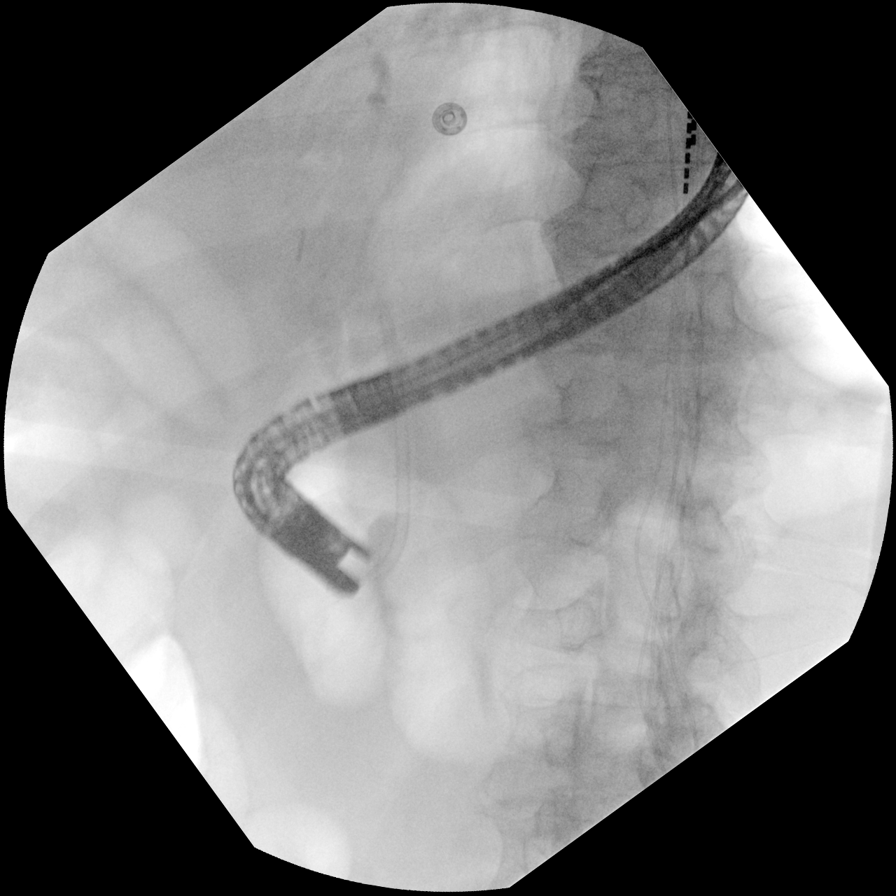
[im 5/5]
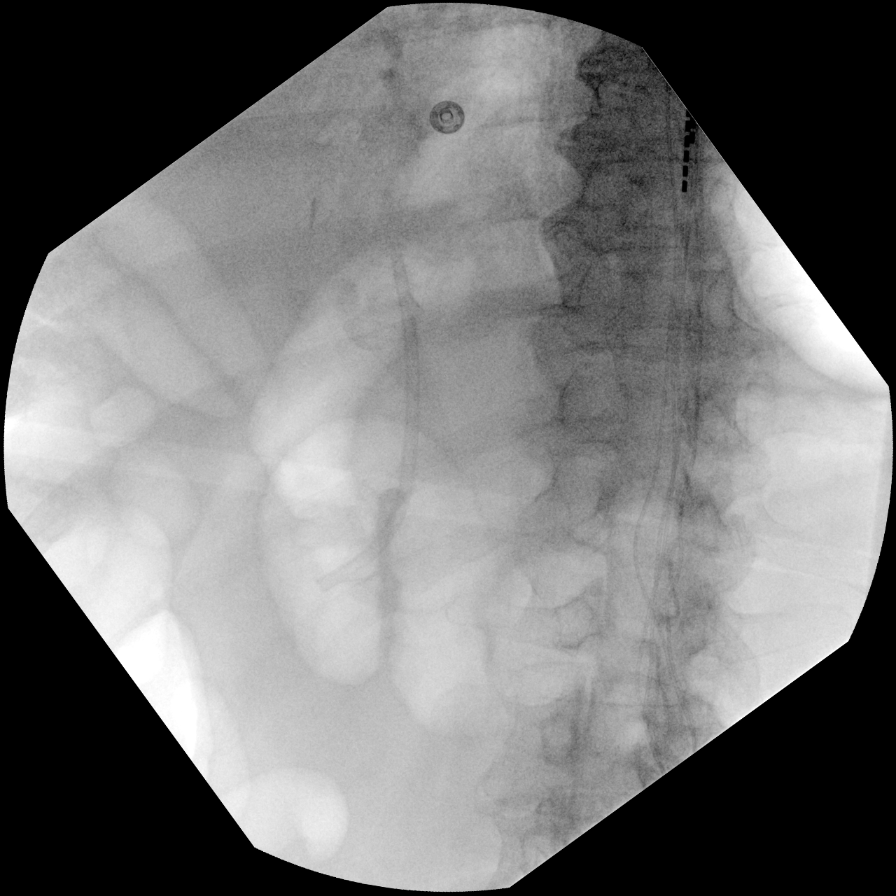

[5 of 5 positions shown; findings below may reference images not displayed]

FINDINGS: Imaging with a C-arm demonstrates cannulation of the common bile
duct with contrast injection demonstrating diffuse dilatation of the
common bile duct and numerous filling defects in the common duct
consistent with calculi. An endoscopic biliary stent was placed
spanning the extent of biliary dilatation and filling defects.
IMPRESSION: Evidence of dilated common bile duct containing numerous calculi. An
endoscopic biliary stent was placed in the common bile duct.

These images were submitted for radiologic interpretation only.
Please see the procedural report for the amount of contrast and the
fluoroscopy time utilized.

## 2022-04-30 ENCOUNTER — Telehealth: Payer: Self-pay | Admitting: Physician Assistant

## 2022-04-30 DIAGNOSIS — R109 Unspecified abdominal pain: Secondary | ICD-10-CM

## 2022-04-30 DIAGNOSIS — K831 Obstruction of bile duct: Secondary | ICD-10-CM

## 2022-04-30 NOTE — Telephone Encounter (Signed)
Patients wife called states she is concerned because the patient is starting to get the same symptoms he had the last time with the bile duct. Seeking advise.

## 2022-04-30 NOTE — Telephone Encounter (Signed)
The pt has a history of bile duct obstructions with ERCP this past April.  He did not keep follow up appt after that ERCP.  He has now began to have epigastric pain today that feels like it did in the past with the obstructions. No nausea or vomiting. He was constipated last week but took mag citrate and had good success.  He is not taking miralax everyday as recommended.  He as been scheduled for appt with Nevin Bloodgood on 12/6.  Dr Rush Landmark would you like to get labs or imaging prior to this appt.?

## 2022-05-01 NOTE — Telephone Encounter (Signed)
CBC/CMP/Lipase/Amylase draw as able before clinic if possible. Imaging can be determined based on labs and in clinic. Thanks.

## 2022-05-01 NOTE — Telephone Encounter (Signed)
I have spoken with the pt's wife and she will bring the pt in today for labs.  Order has been entered

## 2022-05-04 ENCOUNTER — Other Ambulatory Visit (INDEPENDENT_AMBULATORY_CARE_PROVIDER_SITE_OTHER): Payer: Medicare Other

## 2022-05-04 DIAGNOSIS — R109 Unspecified abdominal pain: Secondary | ICD-10-CM | POA: Diagnosis not present

## 2022-05-04 DIAGNOSIS — K831 Obstruction of bile duct: Secondary | ICD-10-CM

## 2022-05-04 LAB — CBC WITH DIFFERENTIAL/PLATELET
Basophils Absolute: 0.1 10*3/uL (ref 0.0–0.1)
Basophils Relative: 0.9 % (ref 0.0–3.0)
Eosinophils Absolute: 0.4 10*3/uL (ref 0.0–0.7)
Eosinophils Relative: 5.2 % — ABNORMAL HIGH (ref 0.0–5.0)
HCT: 42.9 % (ref 39.0–52.0)
Hemoglobin: 14.8 g/dL (ref 13.0–17.0)
Lymphocytes Relative: 36.6 % (ref 12.0–46.0)
Lymphs Abs: 2.6 10*3/uL (ref 0.7–4.0)
MCHC: 34.4 g/dL (ref 30.0–36.0)
MCV: 95 fl (ref 78.0–100.0)
Monocytes Absolute: 0.5 10*3/uL (ref 0.1–1.0)
Monocytes Relative: 6.8 % (ref 3.0–12.0)
Neutro Abs: 3.6 10*3/uL (ref 1.4–7.7)
Neutrophils Relative %: 50.5 % (ref 43.0–77.0)
Platelets: 223 10*3/uL (ref 150.0–400.0)
RBC: 4.51 Mil/uL (ref 4.22–5.81)
RDW: 13.5 % (ref 11.5–15.5)
WBC: 7.1 10*3/uL (ref 4.0–10.5)

## 2022-05-04 LAB — COMPREHENSIVE METABOLIC PANEL
ALT: 23 U/L (ref 0–53)
AST: 20 U/L (ref 0–37)
Albumin: 4.4 g/dL (ref 3.5–5.2)
Alkaline Phosphatase: 58 U/L (ref 39–117)
BUN: 16 mg/dL (ref 6–23)
CO2: 27 mEq/L (ref 19–32)
Calcium: 9.1 mg/dL (ref 8.4–10.5)
Chloride: 104 mEq/L (ref 96–112)
Creatinine, Ser: 0.6 mg/dL (ref 0.40–1.50)
GFR: 96.67 mL/min (ref >60.00)
Glucose, Bld: 106 mg/dL — ABNORMAL HIGH (ref 70–99)
Potassium: 4 mEq/L (ref 3.5–5.1)
Sodium: 139 mEq/L (ref 135–145)
Total Bilirubin: 0.6 mg/dL (ref 0.2–1.2)
Total Protein: 7.7 g/dL (ref 6.0–8.3)

## 2022-05-04 LAB — LIPASE: Lipase: 22 U/L (ref 11.0–59.0)

## 2022-05-04 LAB — AMYLASE: Amylase: 23 U/L — ABNORMAL LOW (ref 27–131)

## 2022-05-06 ENCOUNTER — Ambulatory Visit: Payer: Medicare Other | Admitting: Nurse Practitioner

## 2022-05-08 ENCOUNTER — Encounter: Payer: Self-pay | Admitting: Family Medicine

## 2022-05-08 ENCOUNTER — Ambulatory Visit: Payer: Medicare Other | Admitting: Family Medicine

## 2022-05-08 VITALS — BP 132/70 | HR 75 | Temp 97.9°F | Ht 71.0 in | Wt 225.8 lb

## 2022-05-08 DIAGNOSIS — G14 Postpolio syndrome: Secondary | ICD-10-CM | POA: Diagnosis not present

## 2022-05-08 DIAGNOSIS — R053 Chronic cough: Secondary | ICD-10-CM | POA: Insufficient documentation

## 2022-05-08 DIAGNOSIS — R052 Subacute cough: Secondary | ICD-10-CM | POA: Diagnosis not present

## 2022-05-08 DIAGNOSIS — N3941 Urge incontinence: Secondary | ICD-10-CM

## 2022-05-08 MED ORDER — CHERATUSSIN AC 100-10 MG/5ML PO SOLN: 5.0000 mL | Freq: Three times a day (TID) | ORAL | 0 refills | Status: DC | PRN

## 2022-05-08 MED ORDER — PREDNISONE 20 MG PO TABS: ORAL_TABLET | ORAL | 0 refills | Status: DC

## 2022-05-08 NOTE — Assessment & Plan Note (Signed)
Ongoing 1+ month. Lung sounds consistent with chronic changes of known bibasilar scarring/fibrosis, not pneumonia. No evidence of ongoing bacterial infection. Anticipate persistence of post-infectious cough. Supportive measures reviewed as per instructions. Rx prednisone taper, cheratussin cough syrup with prednisone and sedation precautions, respectively.  Update if not improving with treatment or if signs of bacterial superinfection develop.

## 2022-05-08 NOTE — Patient Instructions (Addendum)
I think you have post-inflammatory cough but not bacterial infection. Take prednisone taper sent to pharmacy.  May use codeine cough syrup for night time.  Let us know if worsening productive cough or fever >101 or not improving with treatment.

## 2022-05-08 NOTE — Assessment & Plan Note (Signed)
Notes improvement on flomax + myrbetriq - continue.

## 2022-05-08 NOTE — Progress Notes (Signed)
Patient ID: Alexander [name], male    DOB: 01-Oct-1950, 71 y.o.   MRN: [id number]  This visit was conducted in person.  BP 132/70   Pulse 75   Temp 97.9 F (36.6 C) (Temporal)   Ht '5\' 11"'$  (1.803 m)   Wt 225 lb 12.8 oz (102.4 kg) Comment: Pt was holding crutches.  SpO2 94%   BMI 31.49 kg/m    CC: cough, dyspnea, body aches  Subjective:   HPI: Alexander [name] is a 71 y.o. male presenting on 05/08/2022 for Cough (C/o cough, SOB and body aches. Started about 1 mo ago. Now just has ongoing cough and SOB. Wife has bronchitis and granddaughter had RSV. Also, c/o bio tube getting blocked. Had appt with Dr. Rush Landmark [GI], but was c/x.)   1+ mo h/o cough and shortness of breath with exertion, with coughing fit. Also with PNdrainage and ST. Fatigue, decreased energy. Having chest > nasal congestion. R sided maxillary sinus pressure.  No fevers/chills, ear or tooth pain, abd pain, nausea, diarrhea.   He continues to alternate '600mg'$  ibuprofen and tylenol '1000mg'$  TID.  He is taking dayquil and nyquil.   No h/o asthma.   Wife sick at home with bronchitis, granddaughter recently with RSV. Son and daughter in law also sick.   By the way - 2 wks ago had episode of left upper abdominal pain. Labwork this week via GI was normal. Pain has since resolved. GI appt earlier this week was cancelled and rescheduled to next month.   H/o recurrent ascending cholangitis s/p ERCP for choledocholithiasis removal and duodenal stricture dilation latest 08/2021. Had not seen GI for f/u in interim.     Relevant past medical, surgical, family and social history reviewed and updated as indicated. Interim medical history since our last visit reviewed. Allergies and medications reviewed and updated. Outpatient Medications Prior to Visit  Medication Sig Dispense Refill   acetaminophen (TYLENOL) 500 MG tablet Take 1,000 mg by mouth every 6 (six) hours as needed for moderate pain or headache.      Ascorbic Acid  (VITAMIN C) 1000 MG tablet Take 1,000 mg by mouth daily.     aspirin EC 81 MG tablet Take 81 mg by mouth daily. Swallow whole.     Carboxymethylcellulose Sodium (LUBRICANT EYE DROPS OP) Place 2 drops into both eyes daily as needed (for dry eyes).     Cholecalciferol (VITAMIN D3) 50 MCG (2000 UT) TABS Take 2,000 Units by mouth daily.     gabapentin (NEURONTIN) 600 MG tablet TAKE 2 TABLETS BY MOUTH 3 TIMES  DAILY 540 tablet 2   ibuprofen (ADVIL) 200 MG tablet Take 400 mg by mouth every 6 (six) hours as needed for headache or moderate pain.     Magnesium 250 MG TABS Take 1 tablet (250 mg total) by mouth at bedtime. (Patient taking differently: Take 250 mg by mouth at bedtime.)  0   Multiple Vitamin (MULTIVITAMIN WITH MINERALS) TABS tablet Take 1 tablet by mouth daily.     MYRBETRIQ 25 MG TB24 tablet TAKE 1 TABLET (25 MG TOTAL) BY MOUTH DAILY. 30 tablet 1   Omega-3 Fatty Acids (FISH OIL) 1000 MG CAPS Take 1,000 mg by mouth daily.      omeprazole (PRILOSEC) 40 MG capsule TAKE 1 CAPSULE (40 MG TOTAL) BY MOUTH 2 (TWO) TIMES DAILY BEFORE A MEAL. 180 capsule 0   rosuvastatin (CRESTOR) 40 MG tablet TAKE 1 TABLET BY MOUTH  DAILY 90 tablet 2  sildenafil (VIAGRA) 100 MG tablet TAKE 1/2 TO 1 TABLET BY MOUTH DAILY AS NEEDED FOR ERECTILE DYSFUNCTION (Patient taking differently: Take 50-100 mg by mouth daily as needed for erectile dysfunction.) 6 tablet 0   tamsulosin (FLOMAX) 0.4 MG CAPS capsule TAKE 1 CAPSULE BY MOUTH EVERY DAY 90 capsule 3   vitamin B-12 (CYANOCOBALAMIN) 1000 MCG tablet Take 1,000 mcg by mouth daily.     vitamin E 400 UNIT capsule Take 400 Units by mouth daily.     No facility-administered medications prior to visit.     Per HPI unless specifically indicated in ROS section below Review of Systems  Objective:  BP 132/70   Pulse 75   Temp 97.9 F (36.6 C) (Temporal)   Ht '5\' 11"'$  (1.803 m)   Wt 225 lb 12.8 oz (102.4 kg) Comment: Pt was holding crutches.  SpO2 94%   BMI 31.49 kg/m    Wt Readings from Last 3 Encounters:  05/08/22 225 lb 12.8 oz (102.4 kg)  10/28/21 224 lb 6 oz (101.8 kg)  10/24/21 220 lb 6 oz (100 kg)      Physical Exam Vitals and nursing note reviewed.  Constitutional:      Appearance: Normal appearance. He is not ill-appearing.     Comments: Ambulates with forearm crutches  HENT:     Head: Normocephalic and atraumatic.     Right Ear: Hearing, tympanic membrane, ear canal and external ear normal. There is no impacted cerumen.     Left Ear: Hearing, tympanic membrane, ear canal and external ear normal. There is no impacted cerumen.     Nose: Congestion present. No mucosal edema.     Right Turbinates: Not enlarged or swollen.     Left Turbinates: Not enlarged or swollen.     Right Sinus: No maxillary sinus tenderness or frontal sinus tenderness.     Left Sinus: No maxillary sinus tenderness or frontal sinus tenderness.     Mouth/Throat:     Mouth: Mucous membranes are moist.     Pharynx: Oropharynx is clear. Posterior oropharyngeal erythema present. No oropharyngeal exudate.  Eyes:     Extraocular Movements: Extraocular movements intact.     Conjunctiva/sclera: Conjunctivae normal.     Pupils: Pupils are equal, round, and reactive to light.  Cardiovascular:     Rate and Rhythm: Normal rate and regular rhythm.     Pulses: Normal pulses.     Heart sounds: Normal heart sounds. No murmur heard. Pulmonary:     Effort: Pulmonary effort is normal. No respiratory distress.     Breath sounds: No wheezing or rhonchi.     Comments: Coarse bibasilar crackles, chronic  Musculoskeletal:     Cervical back: Normal range of motion and neck supple. No rigidity.     Right lower leg: No edema.     Left lower leg: No edema.  Lymphadenopathy:     Cervical: No cervical adenopathy.  Skin:    General: Skin is warm and dry.     Findings: No rash.  Neurological:     Mental Status: He is alert.  Psychiatric:        Mood and Affect: Mood normal.         Behavior: Behavior normal.       Results for orders placed or performed in visit on 05/04/22  Lipase  Result Value Ref Range   Lipase 22.0 11.0 - 59.0 U/L  Amylase  Result Value Ref Range   Amylase 23 (L) 27 - 131  U/L  Comprehensive metabolic panel  Result Value Ref Range   Sodium 139 135 - 145 mEq/L   Potassium 4.0 3.5 - 5.1 mEq/L   Chloride 104 96 - 112 mEq/L   CO2 27 19 - 32 mEq/L   Glucose, Bld 106 (H) 70 - 99 mg/dL   BUN 16 6 - 23 mg/dL   Creatinine, Ser 0.60 0.40 - 1.50 mg/dL   Total Bilirubin 0.6 0.2 - 1.2 mg/dL   Alkaline Phosphatase 58 39 - 117 U/L   AST 20 0 - 37 U/L   ALT 23 0 - 53 U/L   Total Protein 7.7 6.0 - 8.3 g/dL   Albumin 4.4 3.5 - 5.2 g/dL   GFR 96.67 >60.00 mL/min   Calcium 9.1 8.4 - 10.5 mg/dL  CBC with Differential/Platelet  Result Value Ref Range   WBC 7.1 4.0 - 10.5 K/uL   RBC 4.51 4.22 - 5.81 Mil/uL   Hemoglobin 14.8 13.0 - 17.0 g/dL   HCT 42.9 39.0 - 52.0 %   MCV 95.0 78.0 - 100.0 fl   MCHC 34.4 30.0 - 36.0 g/dL   RDW 13.5 11.5 - 15.5 %   Platelets 223.0 150.0 - 400.0 K/uL   Neutrophils Relative % 50.5 43.0 - 77.0 %   Lymphocytes Relative 36.6 12.0 - 46.0 %   Monocytes Relative 6.8 3.0 - 12.0 %   Eosinophils Relative 5.2 (H) 0.0 - 5.0 %   Basophils Relative 0.9 0.0 - 3.0 %   Neutro Abs 3.6 1.4 - 7.7 K/uL   Lymphs Abs 2.6 0.7 - 4.0 K/uL   Monocytes Absolute 0.5 0.1 - 1.0 K/uL   Eosinophils Absolute 0.4 0.0 - 0.7 K/uL   Basophils Absolute 0.1 0.0 - 0.1 K/uL    Assessment & Plan:   Problem List Items Addressed This Visit       Unprioritized   Post-polio syndrome (Chronic)   Urinary incontinence    Notes improvement on flomax + myrbetriq - continue.       Subacute cough - Primary    Ongoing 1+ month. Lung sounds consistent with chronic changes of known bibasilar scarring/fibrosis, not pneumonia. No evidence of ongoing bacterial infection. Anticipate persistence of post-infectious cough. Supportive measures reviewed as per  instructions. Rx prednisone taper, cheratussin cough syrup with prednisone and sedation precautions, respectively.  Update if not improving with treatment or if signs of bacterial superinfection develop.         Meds ordered this encounter  Medications   predniSONE (DELTASONE) 20 MG tablet    Sig: Take two tablets daily for 3 days followed by one tablet daily for 4 days    Dispense:  10 tablet    Refill:  0   guaiFENesin-codeine (CHERATUSSIN AC) 100-10 MG/5ML syrup    Sig: Take 5 mLs by mouth 3 (three) times daily as needed for cough.    Dispense:  120 mL    Refill:  0   No orders of the defined types were placed in this encounter.    Patient Instructions  I think you have post-inflammatory cough but not bacterial infection. Take prednisone taper sent to pharmacy.  May use codeine cough syrup for night time.  Let us know if worsening productive cough or fever >101 or not improving with treatment.   Follow up plan: Return if symptoms worsen or fail to improve.  [name], MD

## 2022-05-10 ENCOUNTER — Other Ambulatory Visit: Payer: Self-pay | Admitting: Gastroenterology

## 2022-05-11 ENCOUNTER — Other Ambulatory Visit: Payer: Self-pay | Admitting: Family Medicine

## 2022-05-11 DIAGNOSIS — N3941 Urge incontinence: Secondary | ICD-10-CM

## 2022-05-21 ENCOUNTER — Other Ambulatory Visit: Payer: Self-pay | Admitting: Family Medicine

## 2022-05-21 DIAGNOSIS — R052 Subacute cough: Secondary | ICD-10-CM

## 2022-05-21 NOTE — Telephone Encounter (Signed)
Spoke with pt asking about sxs and he requested refills. Pt states he improved for about 1 wk. But sxs have returned and have not resolved.

## 2022-05-22 MED ORDER — CHERATUSSIN AC 100-10 MG/5ML PO SOLN: 5.0000 mL | Freq: Three times a day (TID) | ORAL | 0 refills | Status: DC | PRN

## 2022-05-22 NOTE — Telephone Encounter (Signed)
I've refilled codeine cough syrup.  Don't recommend prednisone refill at this time. Given ongoing cough, rec CXR - ordered.

## 2022-05-24 IMAGING — RF DG ERCP WO/W SPHINCTEROTOMY
1 series · 15 of 24 positions shown · non-contrast
Comparison: US Abdomen, 03/31/2021.

CLINICAL DATA: Choledocholithiasis.

EXAM:
ERCP

[Series 1: unknown protocol · 0.20mm/px · 19 acquisitions, 15 frames shown]
[im 1/19]
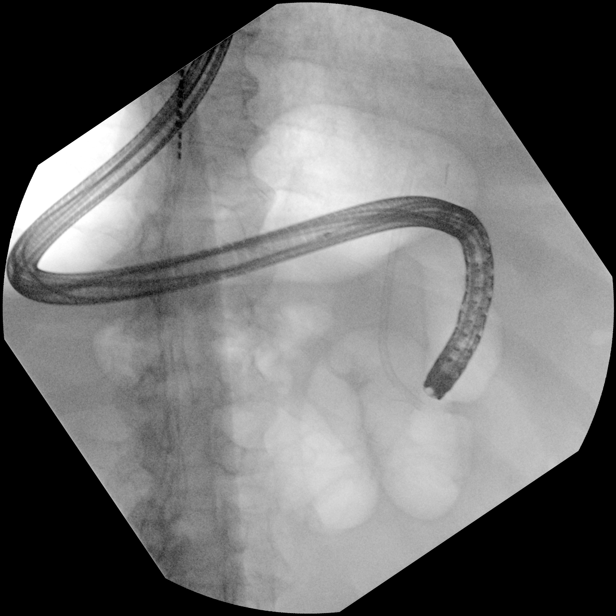
[im 3/19]
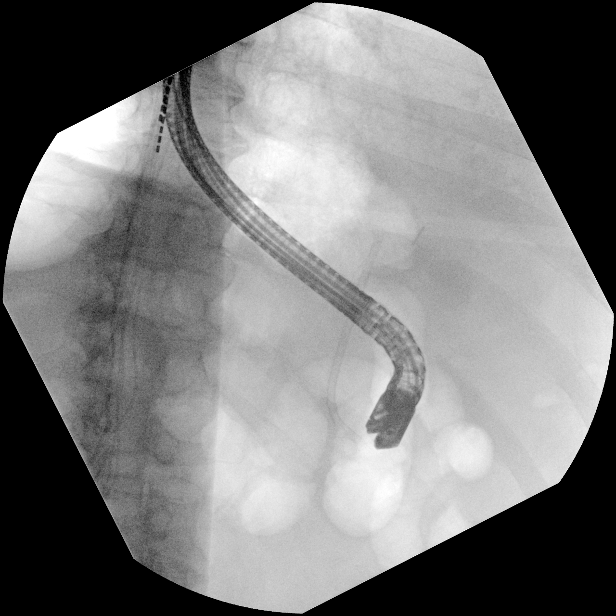
[im 5/19]
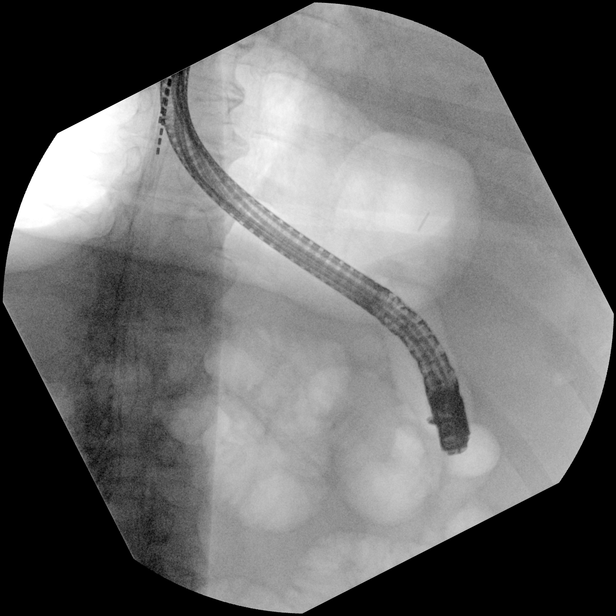
[im 6/19]
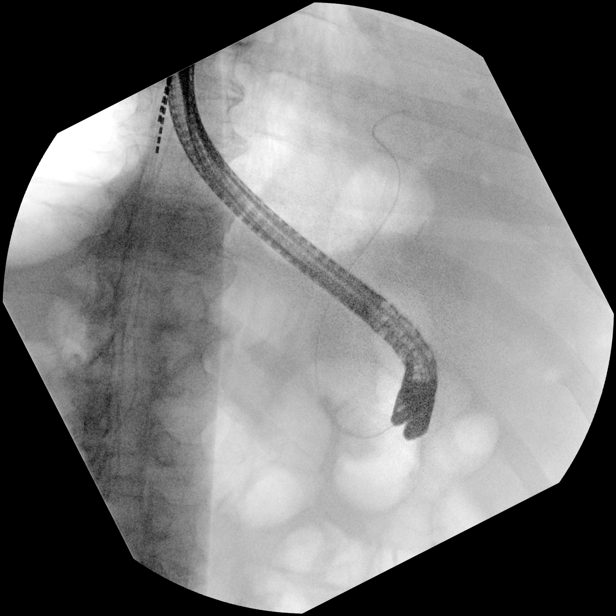
[im 8/19]
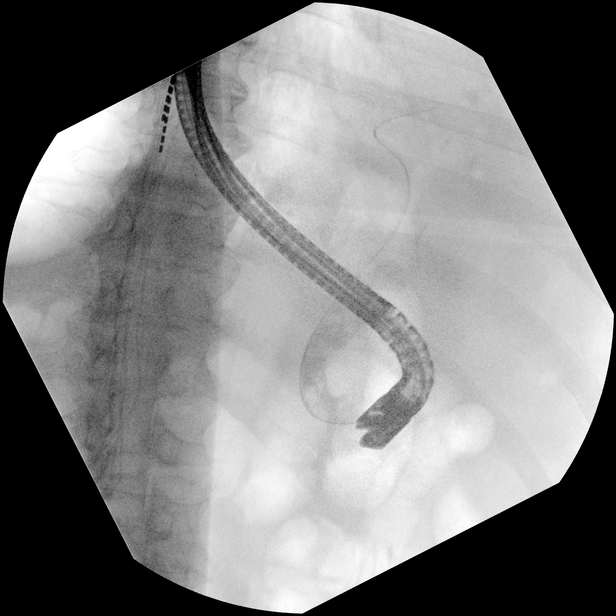
[im 9/19]
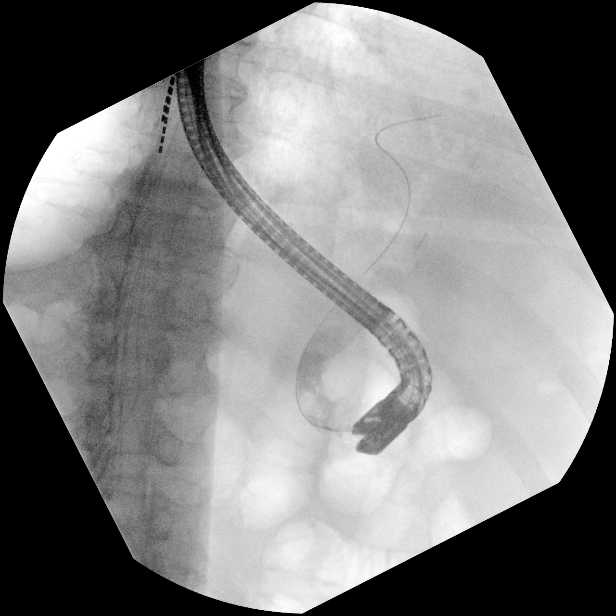
[im 11/19]
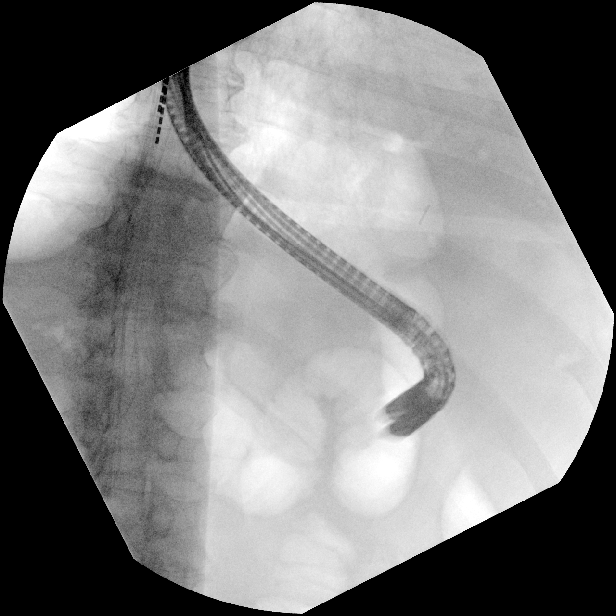
[im 12/19]
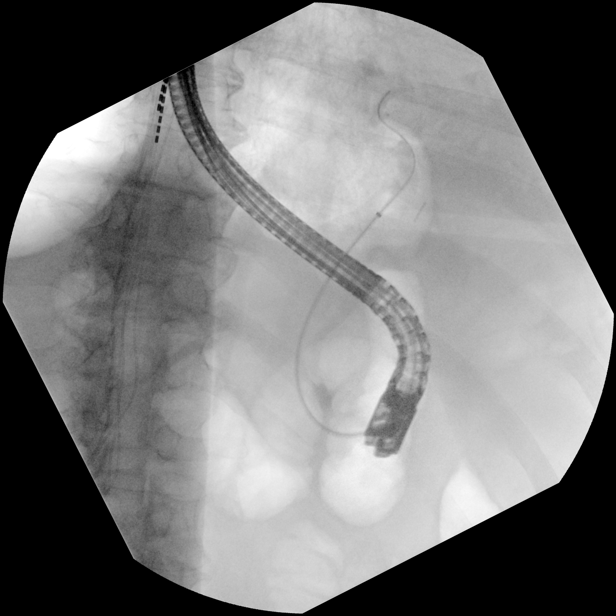
[im 12/19]
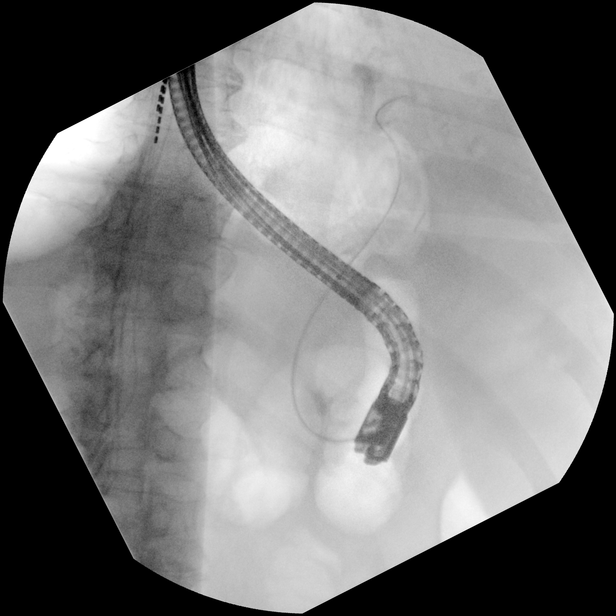
[im 13/19]
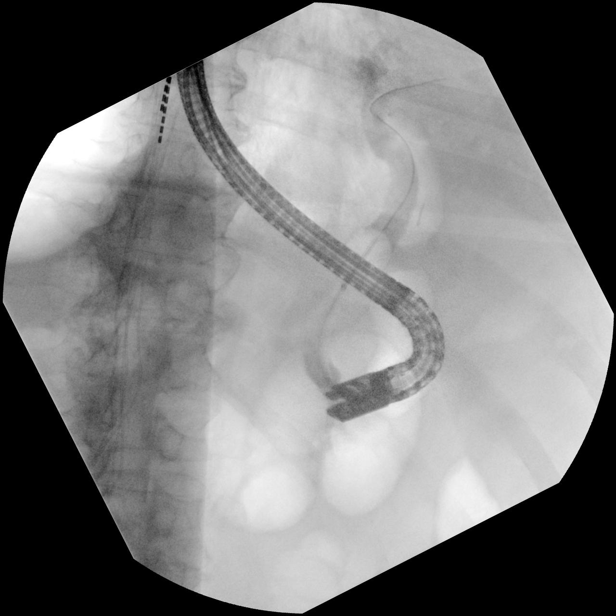
[im 13/19]
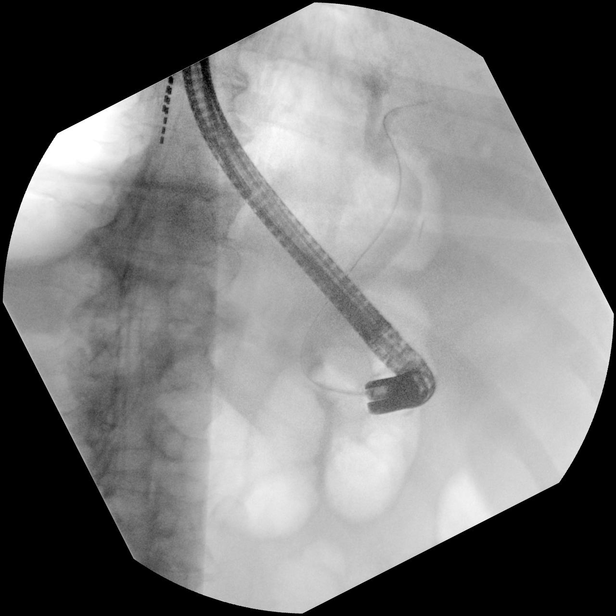
[im 14/19]
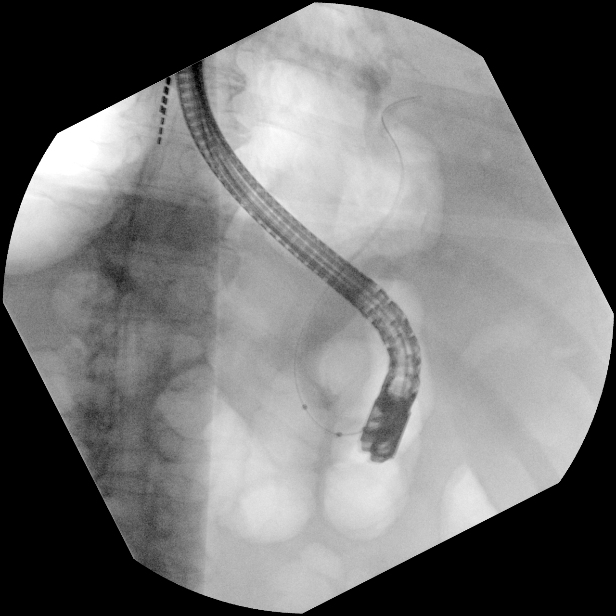
[im 16/19]
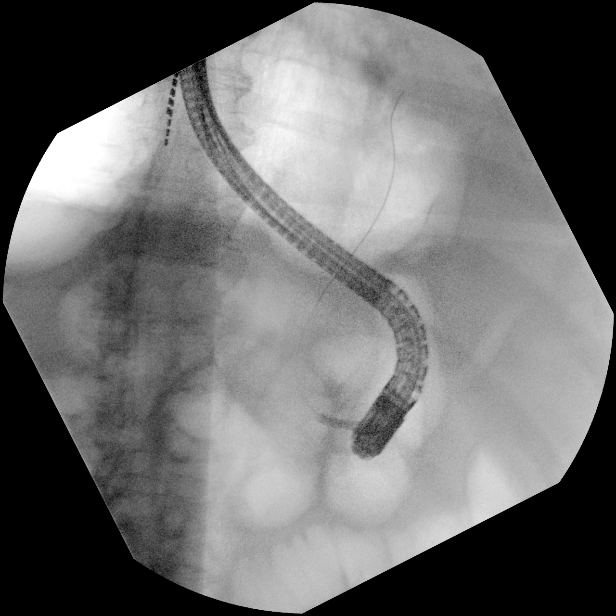
[im 17/19]
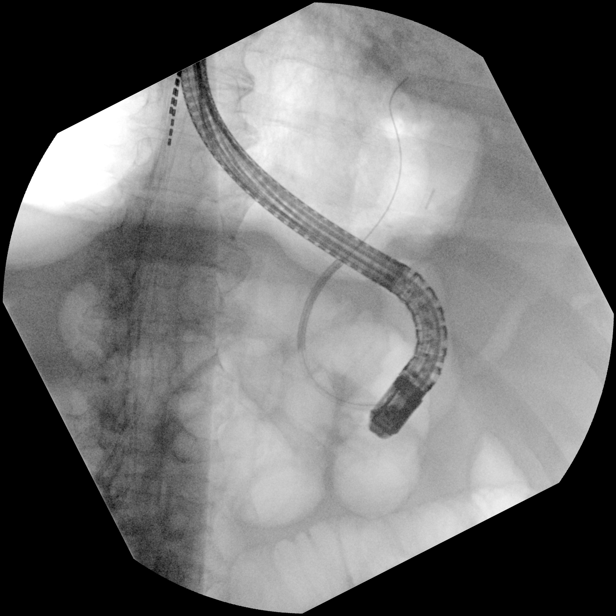
[im 19/19]
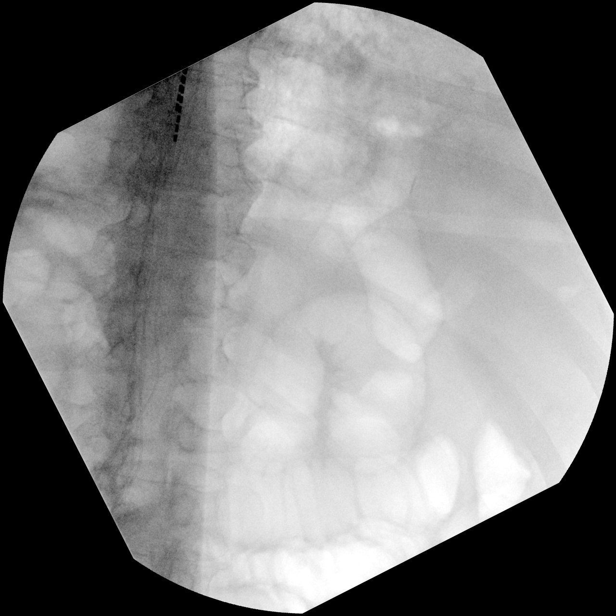

[15 of 24 positions shown; findings below may reference images not displayed]

CT AP, 03/31/2021.

FLUOROSCOPY TIME:  Fluoroscopy Time:  4 minutes, 45 seconds.

Radiation Exposure Index (if provided by the fluoroscopic device):
114.5 mGy

Number of Acquired Spot Images: 3 cine loops with 17, 38, and 148
static images submitted for review.
FINDINGS: Multiple, limited oblique fluoroscopic cine loops of the RIGHT upper
quadrant obtained by C-arm.

Images demonstrating flexible endoscopy, CBD cannulation and
retrograde cholangiogram, sphincterotomy, balloon sweep and plastic
biliary stent placement.

Distal CBD filling defect is demonstrated.
IMPRESSION: Fluoroscopic imaging for ERCP. Distal CBD filling defect is
demonstrated prior to balloon sweep.

For complete description of intra procedural findings, please see
performing service dictation.

## 2022-05-29 ENCOUNTER — Ambulatory Visit (INDEPENDENT_AMBULATORY_CARE_PROVIDER_SITE_OTHER): Payer: Medicare Other | Admitting: Internal Medicine

## 2022-05-29 ENCOUNTER — Ambulatory Visit (INDEPENDENT_AMBULATORY_CARE_PROVIDER_SITE_OTHER)
Admission: RE | Admit: 2022-05-29 | Discharge: 2022-05-29 | Disposition: A | Discharge status: Home / Self Care | Payer: Medicare Other | Source: Ambulatory Visit | Attending: Internal Medicine | Admitting: Internal Medicine

## 2022-05-29 ENCOUNTER — Encounter: Payer: Self-pay | Admitting: Internal Medicine

## 2022-05-29 VITALS — BP 128/76 | HR 98 | Temp 98.3°F | Ht 71.0 in | Wt 222.0 lb

## 2022-05-29 DIAGNOSIS — R059 Cough, unspecified: Secondary | ICD-10-CM | POA: Diagnosis not present

## 2022-05-29 DIAGNOSIS — J22 Unspecified acute lower respiratory infection: Secondary | ICD-10-CM | POA: Diagnosis not present

## 2022-05-29 DIAGNOSIS — R0602 Shortness of breath: Secondary | ICD-10-CM | POA: Diagnosis not present

## 2022-05-29 MED ORDER — PREDNISONE 20 MG PO TABS: 40.0000 mg | ORAL_TABLET | Freq: Every day | ORAL | 0 refills | Status: DC

## 2022-05-29 MED ORDER — DOXYCYCLINE HYCLATE 100 MG PO TABS: 100.0000 mg | ORAL_TABLET | Freq: Two times a day (BID) | ORAL | 0 refills | Status: DC

## 2022-05-29 NOTE — Progress Notes (Signed)
Subjective:    Patient ID: Alexander [name], male    DOB: 09-07-1950, 71 y.o.   MRN: [id number]  HPI Here due to ongoing respiratory symptoms  "I just can't shake it" Keep grandkids overnight while parents work--exposed Post polio with known lung scarring  Initial illness started October Started worsening with drainage and throat symptoms Coughs with cold air exposure (but not new) Low level for 2 months--then saw Dr Darnell Level Got prednisone and antibiotic Felt well after that--though some residual cough Then worsened again after a week---so 2 weeks ago  No fever Breathing is labored---even just walking flat Does hear wheezing--in nostrils No shakes, chills or body aches---though naturally cold Still with drainage--has come back Does have post nasal drip  Current Outpatient Medications on File Prior to Visit  Medication Sig Dispense Refill   acetaminophen (TYLENOL) 500 MG tablet Take 1,000 mg by mouth every 6 (six) hours as needed for moderate pain or headache.      Ascorbic Acid (VITAMIN C) 1000 MG tablet Take 1,000 mg by mouth daily.     aspirin EC 81 MG tablet Take 81 mg by mouth daily. Swallow whole.     Carboxymethylcellulose Sodium (LUBRICANT EYE DROPS OP) Place 2 drops into both eyes daily as needed (for dry eyes).     Cholecalciferol (VITAMIN D3) 50 MCG (2000 UT) TABS Take 2,000 Units by mouth daily.     gabapentin (NEURONTIN) 600 MG tablet TAKE 2 TABLETS BY MOUTH 3 TIMES  DAILY 540 tablet 2   ibuprofen (ADVIL) 200 MG tablet Take 400 mg by mouth every 6 (six) hours as needed for headache or moderate pain.     Magnesium 250 MG TABS Take 1 tablet (250 mg total) by mouth at bedtime. (Patient taking differently: Take 250 mg by mouth at bedtime.)  0   mirabegron ER (MYRBETRIQ) 25 MG TB24 tablet TAKE 1 TABLET (25 MG TOTAL) BY MOUTH DAILY. 90 tablet 1   Multiple Vitamin (MULTIVITAMIN WITH MINERALS) TABS tablet Take 1 tablet by mouth daily.     Omega-3 Fatty Acids (FISH OIL) 1000 MG  CAPS Take 1,000 mg by mouth daily.      omeprazole (PRILOSEC) 40 MG capsule TAKE 1 CAPSULE (40 MG TOTAL) BY MOUTH 2 (TWO) TIMES DAILY BEFORE A MEAL. 180 capsule 0   rosuvastatin (CRESTOR) 40 MG tablet TAKE 1 TABLET BY MOUTH  DAILY 90 tablet 2   sildenafil (VIAGRA) 100 MG tablet TAKE 1/2 TO 1 TABLET BY MOUTH DAILY AS NEEDED FOR ERECTILE DYSFUNCTION (Patient taking differently: Take 50-100 mg by mouth daily as needed for erectile dysfunction.) 6 tablet 0   tamsulosin (FLOMAX) 0.4 MG CAPS capsule TAKE 1 CAPSULE BY MOUTH EVERY DAY 90 capsule 3   vitamin B-12 (CYANOCOBALAMIN) 1000 MCG tablet Take 1,000 mcg by mouth daily.     vitamin E 400 UNIT capsule Take 400 Units by mouth daily.     No current facility-administered medications on file prior to visit.    No Known Allergies  Past Medical History:  Diagnosis Date   Acute cholecystitis 03/2016   with sepsis   Arthritis    CAD S/P percutaneous coronary angioplasty 1995, 2000, 2005   a. 1995 s/p BMS;  b. 2000 s/p BMS;  c. 2005 s/p stent - All stents in [facility] (RCA, LAD & OM - unknown on which date);  d. 04/2015 NSTEMI/Cath: LM nl, ost/p LAD 20%, patent mLAD stent, RI small, OM2 patent stent, pRCA 20%, patent stent, EF 45-50%-->Med  Rx.   Calculus of bile duct with acute cholangitis with obstruction    Chronic pain syndrome    a. Followed @ [facility];  b. Uses 12-14 excedrins per day.   Complication of anesthesia 04/01/2021   agiated- "battling nurses"   Duodenal stricture    Essential hypertension    Fatty liver    Gallstones    GERD (gastroesophageal reflux disease)    History of depression    History of post poliomyelitis muscular atrophy    HLD (hyperlipidemia)    Ischemic cardiomyopathy    a. 04/2015 Echo: EF 40-45%, mod antsept, ant, antlat, apical HK, mild AI/MR, PASP 70mHg.   Migraines    Neuropathy    due to post polio syndrome   OSA (obstructive sleep apnea)    does not want to use CPAP, using mouth guard   PAF  (paroxysmal atrial fibrillation) ([place]    a. 04/2015 in setting of cholecystits and sepsis -->Amio;  b. CHA2DS2VASc = 4--> Eliquis.   Pneumonia 04/2015   Post-polio syndrome    a. ambulates with braces.   [place] 01/07/2018   Shoulder pain, right     Past Surgical History:  Procedure Laterality Date   BALLOON DILATION N/A 06/06/2018   Procedure: BALLOON DILATION;  Surgeon: MRush LandmarkGTelford Nab, MD;  [place]  Service: Gastroenterology;  Laterality: N/A;   BALLOON DILATION N/A 07/06/2018   Procedure: BALLOON DILATION;  Surgeon: MRush LandmarkGTelford Nab, MD;  [place]  Service: Gastroenterology;  Laterality: N/A;   BALLOON DILATION N/A 07/24/2021   Procedure: BALLOON DILATION;  Surgeon: MRush LandmarkGTelford Nab, MD;  [place]  Service: Gastroenterology;  Laterality: N/A;   BILIARY BRUSHING  06/05/2021   Procedure: BILIARY BRUSHING;  Surgeon: MRush LandmarkGTelford Nab, MD;  [place]  Service: Gastroenterology;;   BILIARY DILATION  06/05/2021   Procedure: BILIARY DILATION;  Surgeon: MIrving Copas, MD;  [place]  Service: Gastroenterology;;   BILIARY DILATION  09/15/2021   Procedure: BILIARY DILATION;  Surgeon: MIrving Copas, MD;  [facility];  Service: Gastroenterology;;   BILIARY STENT PLACEMENT  01/08/2018   Procedure: BILIARY STENT PLACEMENT;  Surgeon: GJackquline Denmark MD;  [place]  Service: Endoscopy;;   BILIARY STENT PLACEMENT N/A 02/09/2018   Procedure: BILIARY STENT PLACEMENT;  Surgeon: MIrving Copas, MD;  [facility];  Service: Gastroenterology;  Laterality: N/A;   BILIARY STENT PLACEMENT  04/01/2021   Procedure: BILIARY STENT PLACEMENT;  Surgeon: GJackquline Denmark MD;  [facility]  Service: Endoscopy;;   BIOPSY  06/06/2018   Procedure: BIOPSY;  Surgeon: MIrving Copas, MD;  [facility]  Service:  Gastroenterology;;   BIOPSY  04/01/2021   Procedure: BIOPSY;  Surgeon: GJackquline Denmark MD;  [facility]  Service: Endoscopy;;   BIOPSY  06/05/2021   Procedure: BIOPSY;  Surgeon: MIrving Copas, MD;  [facility]  Service: Gastroenterology;;   CARDIAC CATHETERIZATION N/A 04/30/2015   Procedure: Left Heart Cath and Coronary Angiography;  Surgeon: TTroy Sine MD;  [place];  Service: Cardiovascular; LM nl, ost/p LAD 20%, patent mLAD stent, RI small, OM2 patent stent, pRCA 20%, patent stent, EF 45-50%-->Med Rx    cervical spine stimulator  08/2012   CHOLECYSTECTOMY  06/10/2015   Procedure: LAPAROSCOPIC CHOLECYSTECTOMY;  Surgeon: LMickeal Skinner MD;  [place]  Service: General;;   COLONOSCOPY  09/2013   TA x2, rpt 5 yrs (  [place])   COLONOSCOPY WITH ESOPHAGOGASTRODUODENOSCOPY (EGD)  06/2018   reactive gastropathy, tubular adenoma, rpt colonoscopy 5 yrs ([place])   COLONOSCOPY WITH PROPOFOL N/A 06/06/2018   Procedure: COLONOSCOPY WITH PROPOFOL;  Surgeon: Rush Landmark Telford Nab., MD;  Location: [place];  Service: Gastroenterology;  Laterality: N/A;   CORONARY ANGIOPLASTY WITH STENT PLACEMENT  1995, 2000, 2005   stents in RCA, LAD & Cx-OM. -Stents were patent by cardiac catheterization in November 2016   ENDOSCOPIC RETROGRADE CHOLANGIOPANCREATOGRAPHY (ERCP) WITH PROPOFOL N/A 02/09/2018   Procedure: ENDOSCOPIC RETROGRADE CHOLANGIOPANCREATOGRAPHY (ERCP) WITH PROPOFOL;  Surgeon: Irving Copas., MD;  [facility];  Service: Gastroenterology;  Laterality: N/A;   ENDOSCOPIC RETROGRADE CHOLANGIOPANCREATOGRAPHY (ERCP) WITH PROPOFOL N/A 03/30/2018   Procedure: ENDOSCOPIC RETROGRADE CHOLANGIOPANCREATOGRAPHY (ERCP) WITH PROPOFOL;  Surgeon: Rush Landmark Telford Nab., MD;  [facility];  Service: Gastroenterology;  Laterality: N/A;   ENDOSCOPIC RETROGRADE CHOLANGIOPANCREATOGRAPHY (ERCP) WITH PROPOFOL N/A 06/05/2021    Procedure: ENDOSCOPIC RETROGRADE CHOLANGIOPANCREATOGRAPHY (ERCP) WITH PROPOFOL;  Surgeon: Rush Landmark Telford Nab., MD;  Location: [place];  Service: Gastroenterology;  Laterality: N/A;   ENDOSCOPIC RETROGRADE CHOLANGIOPANCREATOGRAPHY (ERCP) WITH PROPOFOL N/A 09/15/2021   Procedure: ENDOSCOPIC RETROGRADE CHOLANGIOPANCREATOGRAPHY (ERCP) WITH PROPOFOL;  Surgeon: Rush Landmark Telford Nab., MD;  [facility];  Service: Gastroenterology;  Laterality: N/A;   ERCP N/A 01/08/2018   Procedure: ENDOSCOPIC RETROGRADE CHOLANGIOPANCREATOGRAPHY (ERCP);  Surgeon: Jackquline Denmark, MD;  [facility];  Service: Endoscopy;  Laterality: N/A;   ERCP N/A 04/01/2021   Procedure: ENDOSCOPIC RETROGRADE CHOLANGIOPANCREATOGRAPHY (ERCP);  Surgeon: Jackquline Denmark, MD;  Location[facility];  Service: Endoscopy;  Laterality: N/A;  Please schedule after 1 PM   ESOPHAGOGASTRODUODENOSCOPY N/A 03/30/2018   Procedure: ESOPHAGOGASTRODUODENOSCOPY (EGD);  Surgeon: Irving Copas., MD;  [facility];  Service: Gastroenterology;  Laterality: N/A;   ESOPHAGOGASTRODUODENOSCOPY (EGD) WITH PROPOFOL N/A 06/06/2018   Procedure: ESOPHAGOGASTRODUODENOSCOPY (EGD) WITH PROPOFOL;  Surgeon: Rush Landmark Telford Nab., MD;  Location: [place];  Service: Gastroenterology;  Laterality: N/A;   ESOPHAGOGASTRODUODENOSCOPY (EGD) WITH PROPOFOL N/A 07/06/2018   Procedure: ESOPHAGOGASTRODUODENOSCOPY (EGD) WITH PROPOFOL;  Surgeon: Rush Landmark Telford Nab., MD;  Location: [place];  Service: Gastroenterology;  Laterality: N/A;   ESOPHAGOGASTRODUODENOSCOPY (EGD) WITH PROPOFOL N/A 07/24/2021   Procedure: ESOPHAGOGASTRODUODENOSCOPY (EGD) WITH PROPOFOL WITH FLUOROSCOPY;  Surgeon: Rush Landmark Telford Nab., MD;  Location: [place];  Service: Gastroenterology;  Laterality: N/A;   EUS N/A 07/24/2021   Procedure: UPPER ENDOSCOPIC ULTRASOUND (EUS) RADIAL;  Surgeon: Irving Copas., MD;  Location: [place];   Service: Gastroenterology;  Laterality: N/A;   lumbar spine stimulator     POLYPECTOMY  06/06/2018   Procedure: POLYPECTOMY;  Surgeon: Mansouraty, Telford Nab., MD;  Location: [place];  Service: Gastroenterology;;   REMOVAL OF STONES  03/30/2018   Procedure: REMOVAL OF STONES;  Surgeon: Irving Copas., MD;  [facility];  Service: Gastroenterology;;   REMOVAL OF STONES  06/05/2021   Procedure: REMOVAL OF STONES;  Surgeon: Irving Copas., MD;  Location: [place];  Service: Gastroenterology;;   REMOVAL OF STONES  09/15/2021   Procedure: REMOVAL OF STONES;  Surgeon: Irving Copas., MD;  [facility];  Service: Gastroenterology;;   Joan Mayans  01/08/2018   Procedure: Joan Mayans;  Surgeon: Jackquline Denmark, MD;  Location: [place];  Service: Endoscopy;;   SPHINCTEROTOMY  02/09/2018   Procedure: Joan Mayans;  Surgeon: Mansouraty, Telford Nab., MD;  [facility];  Service: Gastroenterology;;   [facility] CHOLANGIOSCOPY N/A 06/05/2021   Procedure: SAYTKZSW CHOLANGIOSCOPY;  Surgeon: Irving Copas., MD;  Location: [place];  Service: Gastroenterology;  Laterality: N/A;   STENT REMOVAL  02/09/2018   Procedure: STENT REMOVAL;  Surgeon: Irving Copas., MD;  [facility];  Service: Gastroenterology;;   Lavell Islam REMOVAL  03/30/2018   Procedure: STENT REMOVAL;  Surgeon: Irving Copas., MD;  [facility];  Service: Gastroenterology;;   Lavell Islam REMOVAL  06/05/2021   Procedure: STENT REMOVAL;  Surgeon: Irving Copas., MD;  Location: [place];  Service: Gastroenterology;;   TRANSTHORACIC ECHOCARDIOGRAM  04/2015   EF 40-45%, mod antsept, ant, antlat, apical HK, mild AI/MR, PASP 53mHg.   TRANSTHORACIC ECHOCARDIOGRAM  08/23/2020   EF 55 to 60%.  No RWMA.  Mild LVH.  GR 1 DD.  Normal RV size and function with normal RVP.  Normal mitral and aortic valve.   UVULOPALATOPHARYNGOPLASTY,  TONSILLECTOMY AND SEPTOPLASTY  2002    Family History  Problem Relation Age of Onset   Diabetes Mother    Heart disease Mother    Cancer Sister 434      male cancer   Breast cancer Brother 679  Hypertension Maternal Aunt    Cancer Maternal Uncle        spinal   Coronary artery disease Neg Hx    Stroke Neg Hx    Colon cancer Neg Hx    Pancreatic cancer Neg Hx    Stomach cancer Neg Hx    Esophageal cancer Neg Hx    Inflammatory bowel disease Neg Hx    Liver disease Neg Hx    Rectal cancer Neg Hx     Social History   Socioeconomic History   Marital status: Married    Spouse name: Robyn   Number of children: 3   Years of education: Not on file   Highest education level: Not on file  Occupational History   Occupation: retired  Tobacco Use   Smoking status: Former    Types: Cigarettes    Quit date: 1972    Years since quitting: 52.0   Smokeless tobacco: Never   Tobacco comments:    was very light  Vaping Use   Vaping Use: Never used  Substance and Sexual Activity   Alcohol use: Not Currently    Comment: ocassional -maybe 2 beers   Drug use: No   Sexual activity: Yes  Other Topics Concern   Not on file  Social History Narrative   Caffeine: 1 cup soda, occasional coffee   Lives with wife ([name] and 130yo son, 4 dogs   Previously worked for [employer] r[employer]ince 2002 for post-polio syndrome   Activity: no regular activity   Diet: overall healthy, good fruits and vegetables, good amt water   Social Determinants of Health   Financial Resource Strain: Low Risk  (10/21/2021)   Overall Financial Resource Strain (CARDIA)    Difficulty of Paying Living Expenses: Not hard at all  Food Insecurity: No Food Insecurity (10/21/2021)   Hunger Vital Sign    Worried About Running Out of Food in the Last Year: Never true    [place] the Last Year: Never true  Transportation Needs: No Transportation Needs (10/21/2021)   PRAPARE - TArmed forces logistics/support/administrative officer(Medical): No    Lack of Transportation (Non-Medical): No  Physical Activity: Insufficiently Active (10/21/2021)   Exercise Vital Sign    Days of Exercise per Week: 4 days    Minutes of Exercise per Session: 30 min  Stress: No  Stress Concern Present (10/21/2021)   [place]    Feeling of Stress : Not at all  Social Connections: Moderately Integrated (10/21/2021)   Social Connection and Isolation Panel [NHANES]    Frequency of Communication with Friends and Family: More than three times a week    Frequency of Social Gatherings with Friends and Family: Twice a week    Attends Religious Services: Never    Marine scientist or Organizations: Yes    Attends Music therapist: More than 4 times per year    Marital Status: Married  Human resources officer Violence: Not At Risk (10/21/2021)   Humiliation, Afraid, Rape, and Kick questionnaire    Fear of Current or Ex-Partner: No    Emotionally Abused: No    Physically Abused: No    Sexually Abused: No   Review of Systems Leg braces and crutches due to leg weakness---left side is worse Not sleeping well --mostly from nocturia (some better with meds)      Objective:   Physical Exam Constitutional:      Appearance: Normal appearance.     Comments: Some coarse cough  HENT:     Head:     Comments: No sinus tenderness    Right Ear: Tympanic membrane and ear canal normal.     Left Ear: Tympanic membrane and ear canal normal.     Nose: Congestion present.     Mouth/Throat:     Pharynx: No oropharyngeal exudate or posterior oropharyngeal erythema.  Pulmonary:     Effort: Pulmonary effort is normal.     Breath sounds: No wheezing or rales.     Comments: Decreased breath sounds at bases but otherwise clear Musculoskeletal:     Cervical back: Neck supple.  Neurological:     Mental Status: He is alert.            Assessment & Plan:

## 2022-05-29 NOTE — Assessment & Plan Note (Addendum)
Has had symptoms going back almost 3 months May have started with RSV but also some known past scarring Will check CXR  CXR just again shows some chronic changes--no consolidation Will try doxycycline 100 bid for 7 days Prednisone 40 x 3, 20 x 3  If persistent SOB, needs pulmonary evaluation

## 2022-06-11 ENCOUNTER — Ambulatory Visit: Payer: Medicare Other | Admitting: Nurse Practitioner

## 2022-06-24 ENCOUNTER — Other Ambulatory Visit: Payer: Self-pay | Admitting: Family Medicine

## 2022-06-24 DIAGNOSIS — E785 Hyperlipidemia, unspecified: Secondary | ICD-10-CM

## 2022-07-16 NOTE — Progress Notes (Signed)
Synopsis: Referred for chronic cough, post covid issues by [name], MD  Subjective:   PATIENT ID: Alexander [name] GENDER: male DOB: 1951/02/14, MRN: [id number]  Chief Complaint  Patient presents with   Consult    Cough, dyspnea and no energy. Sx x 4 months   72yM with history of CAD, HTN, ICM, OSA not on CPAP, pAF, very light smoking in past. Had covid-19 a year ago in January was not hospitalized with it.   Coughing, short of breath, little energy over last 4 months. Cough is dry occasionally productive of sputum but he just swallows it. He does have sinus congestion and postnasal drainage. He tried flonase on and off since November. Benadryl here and there. He is on omeprazole BID for reflux. No solid/liquid food dysphagia.   Got steroid course in October/Nov, steroids did not help. Had another round of steroids/antibiotic which did not help.   Had polio as a kid and started to need braces/crutches in 2002.   Some morning stiffness but takes 10-15 min to loosen up, no rashes, raynaud's phenomenon, ulcers in mouth or nose.   Otherwise pertinent review of systems is negative.  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 Some exposure to cleaning agents in past, some solvents. No MJ, vaping. Born in [place], lived for [place] in [place] and then was in [place] in [place], moved to [place]. 3 dogs. No hot tub.    Past Medical History:  Diagnosis Date   Acute cholecystitis 03/2016   with sepsis   Arthritis    CAD S/P percutaneous coronary angioplasty 1995, 2000, 2005   a. 1995 s/p BMS;  b. 2000 s/p BMS;  c. 2005 s/p stent - All stents in [facility] (RCA, LAD & OM - unknown on which date);  d. 04/2015 NSTEMI/Cath: LM nl, ost/p LAD 20%, patent mLAD stent, RI small, OM2 patent stent, pRCA 20%, patent stent, EF 45-50%-->Med Rx.   Calculus of bile duct with acute cholangitis with obstruction    Chronic pain syndrome     a. Followed @ [facility];  b. Uses 12-14 excedrins per day.   Complication of anesthesia 04/01/2021   agiated- "battling nurses"   Duodenal stricture    Essential hypertension    Fatty liver    Gallstones    GERD (gastroesophageal reflux disease)    History of depression    History of post poliomyelitis muscular atrophy    HLD (hyperlipidemia)    Ischemic cardiomyopathy    a. 04/2015 Echo: EF 40-45%, mod antsept, ant, antlat, apical HK, mild AI/MR, PASP 36mHg.   Migraines    Neuropathy    due to post polio syndrome   OSA (obstructive sleep apnea)    does not want to use CPAP, using mouth guard   PAF (paroxysmal atrial fibrillation) ([place]    a. 04/2015 in setting of cholecystits and sepsis -->Amio;  b. CHA2DS2VASc = 4--> Eliquis.   Pneumonia 04/2015   Post-polio syndrome    a. ambulates with braces.   Septic shock ([facility] 01/07/2018   Shoulder pain, right      Family History  Problem Relation Age of Onset   Diabetes Mother    Heart disease Mother    Cancer Sister 442      male cancer   Breast cancer Brother 651  Hypertension Maternal Aunt    Cancer Maternal Uncle  spinal   Coronary artery disease Neg Hx    Stroke Neg Hx    Colon cancer Neg Hx    Pancreatic cancer Neg Hx    Stomach cancer Neg Hx    Esophageal cancer Neg Hx    Inflammatory bowel disease Neg Hx    Liver disease Neg Hx    Rectal cancer Neg Hx      Past Surgical History:  Procedure Laterality Date   BALLOON DILATION N/A 06/06/2018   Procedure: BALLOON DILATION;  Surgeon: Mansouraty, Telford Nab., MD;  Location: [place];  Service: Gastroenterology;  Laterality: N/A;   BALLOON DILATION N/A 07/06/2018   Procedure: BALLOON DILATION;  Surgeon: Rush Landmark Telford Nab., MD;  Location: [place];  Service: Gastroenterology;  Laterality: N/A;   BALLOON DILATION N/A 07/24/2021   Procedure: BALLOON DILATION;  Surgeon: Rush Landmark Telford Nab., MD;  Location: [place];  Service:  Gastroenterology;  Laterality: N/A;   BILIARY BRUSHING  06/05/2021   Procedure: BILIARY BRUSHING;  Surgeon: Rush Landmark Telford Nab., MD;  Location: [place];  Service: Gastroenterology;;   BILIARY DILATION  06/05/2021   Procedure: BILIARY DILATION;  Surgeon: Irving Copas., MD;  Location: [place];  Service: Gastroenterology;;   BILIARY DILATION  09/15/2021   Procedure: BILIARY DILATION;  Surgeon: Irving Copas., MD;  Location[facility];  Service: Gastroenterology;;   BILIARY STENT PLACEMENT  01/08/2018   Procedure: BILIARY STENT PLACEMENT;  Surgeon: Jackquline Denmark, MD;  Location: [place];  Service: Endoscopy;;   BILIARY STENT PLACEMENT N/A 02/09/2018   Procedure: BILIARY STENT PLACEMENT;  Surgeon: Irving Copas., MD;  Location: [facility];  Service: Gastroenterology;  Laterality: N/A;   BILIARY STENT PLACEMENT  04/01/2021   Procedure: BILIARY STENT PLACEMENT;  Surgeon: Jackquline Denmark, MD;  Location: [facility];  Service: Endoscopy;;   BIOPSY  06/06/2018   Procedure: BIOPSY;  Surgeon: Irving Copas., MD;  Location: [facility] ENDOSCOPY;  Service: Gastroenterology;;   BIOPSY  04/01/2021   Procedure: BIOPSY;  Surgeon: Jackquline Denmark, MD;  Location: [facility] ENDOSCOPY;  Service: Endoscopy;;   BIOPSY  06/05/2021   Procedure: BIOPSY;  Surgeon: Irving Copas., MD;  Location: [facility] ENDOSCOPY;  Service: Gastroenterology;;   CARDIAC CATHETERIZATION N/A 04/30/2015   Procedure: Left Heart Cath and Coronary Angiography;  Surgeon: Troy Sine, MD;  Location: [place] [facility];  Service: Cardiovascular; LM nl, ost/p LAD 20%, patent mLAD stent, RI small, OM2 patent stent, pRCA 20%, patent stent, EF 45-50%-->Med Rx    cervical spine stimulator  08/2012   CHOLECYSTECTOMY  06/10/2015   Procedure: LAPAROSCOPIC CHOLECYSTECTOMY;  Surgeon: Arta Bruce Kinsinger, MD;  Location: [place];  Service: General;;   COLONOSCOPY  09/2013   TA x2, rpt 5 yrs ([place])    COLONOSCOPY WITH ESOPHAGOGASTRODUODENOSCOPY (EGD)  06/2018   reactive gastropathy, tubular adenoma, rpt colonoscopy 5 yrs (Mansouraty)   COLONOSCOPY WITH PROPOFOL N/A 06/06/2018   Procedure: COLONOSCOPY WITH PROPOFOL;  Surgeon: Irving Copas., MD;  Location: [place];  Service: Gastroenterology;  Laterality: N/A;   CORONARY ANGIOPLASTY WITH STENT PLACEMENT  1995, 2000, 2005   stents in RCA, LAD & Cx-OM. -Stents were patent by cardiac catheterization in November 2016   ENDOSCOPIC RETROGRADE CHOLANGIOPANCREATOGRAPHY (ERCP) WITH PROPOFOL N/A 02/09/2018   Procedure: ENDOSCOPIC RETROGRADE CHOLANGIOPANCREATOGRAPHY (ERCP) WITH PROPOFOL;  Surgeon: Irving Copas., MD;  Location[facility];  Service: Gastroenterology;  Laterality: N/A;   ENDOSCOPIC RETROGRADE CHOLANGIOPANCREATOGRAPHY (ERCP) WITH PROPOFOL N/A 03/30/2018   Procedure: ENDOSCOPIC RETROGRADE CHOLANGIOPANCREATOGRAPHY (ERCP) WITH PROPOFOL;  Surgeon: Irving Copas., MD;  Location: [facility];  Service: Gastroenterology;  Laterality: N/A;   ENDOSCOPIC RETROGRADE CHOLANGIOPANCREATOGRAPHY (ERCP) WITH PROPOFOL N/A 06/05/2021   Procedure: ENDOSCOPIC RETROGRADE CHOLANGIOPANCREATOGRAPHY (ERCP) WITH PROPOFOL;  Surgeon: Rush Landmark Telford Nab., MD;  Location: [place];  Service: Gastroenterology;  Laterality: N/A;   ENDOSCOPIC RETROGRADE CHOLANGIOPANCREATOGRAPHY (ERCP) WITH PROPOFOL N/A 09/15/2021   Procedure: ENDOSCOPIC RETROGRADE CHOLANGIOPANCREATOGRAPHY (ERCP) WITH PROPOFOL;  Surgeon: Rush Landmark Telford Nab., MD;  Location[facility];  Service: Gastroenterology;  Laterality: N/A;   ERCP N/A 01/08/2018   Procedure: ENDOSCOPIC RETROGRADE CHOLANGIOPANCREATOGRAPHY (ERCP);  Surgeon: Jackquline Denmark, MD;  Location: [facility] ENDOSCOPY;  Service: Endoscopy;  Laterality: N/A;   ERCP N/A 04/01/2021   Procedure: ENDOSCOPIC RETROGRADE CHOLANGIOPANCREATOGRAPHY (ERCP);  Surgeon: Jackquline Denmark, MD;  Location: [facility] ENDOSCOPY;  Service:  Endoscopy;  Laterality: N/A;  Please schedule after 1 PM   ESOPHAGOGASTRODUODENOSCOPY N/A 03/30/2018   Procedure: ESOPHAGOGASTRODUODENOSCOPY (EGD);  Surgeon: Irving Copas., MD;  Location: [facility];  Service: Gastroenterology;  Laterality: N/A;   ESOPHAGOGASTRODUODENOSCOPY (EGD) WITH PROPOFOL N/A 06/06/2018   Procedure: ESOPHAGOGASTRODUODENOSCOPY (EGD) WITH PROPOFOL;  Surgeon: Rush Landmark Telford Nab., MD;  Location: [place];  Service: Gastroenterology;  Laterality: N/A;   ESOPHAGOGASTRODUODENOSCOPY (EGD) WITH PROPOFOL N/A 07/06/2018   Procedure: ESOPHAGOGASTRODUODENOSCOPY (EGD) WITH PROPOFOL;  Surgeon: Rush Landmark Telford Nab., MD;  Location: [place];  Service: Gastroenterology;  Laterality: N/A;   ESOPHAGOGASTRODUODENOSCOPY (EGD) WITH PROPOFOL N/A 07/24/2021   Procedure: ESOPHAGOGASTRODUODENOSCOPY (EGD) WITH PROPOFOL WITH FLUOROSCOPY;  Surgeon: Rush Landmark Telford Nab., MD;  Location: [place];  Service: Gastroenterology;  Laterality: N/A;   EUS N/A 07/24/2021   Procedure: UPPER ENDOSCOPIC ULTRASOUND (EUS) RADIAL;  Surgeon: Irving Copas., MD;  Location: [place];  Service: Gastroenterology;  Laterality: N/A;   lumbar spine stimulator     POLYPECTOMY  06/06/2018   Procedure: POLYPECTOMY;  Surgeon: Mansouraty, Telford Nab., MD;  Location: [place];  Service: Gastroenterology;;   REMOVAL OF STONES  03/30/2018   Procedure: REMOVAL OF STONES;  Surgeon: Irving Copas., MD;  Location: [facility];  Service: Gastroenterology;;   REMOVAL OF STONES  06/05/2021   Procedure: REMOVAL OF STONES;  Surgeon: Irving Copas., MD;  Location: [place];  Service: Gastroenterology;;   REMOVAL OF STONES  09/15/2021   Procedure: REMOVAL OF STONES;  Surgeon: Irving Copas., MD;  Location: [facility];  Service: Gastroenterology;;   Joan Mayans  01/08/2018   Procedure: Joan Mayans;  Surgeon: Jackquline Denmark, MD;  Location: [place];   Service: Endoscopy;;   SPHINCTEROTOMY  02/09/2018   Procedure: Joan Mayans;  Surgeon: Mansouraty, Telford Nab., MD;  Location: [facility];  Service: Gastroenterology;;   [facility] CHOLANGIOSCOPY N/A 06/05/2021   Procedure: [id number] CHOLANGIOSCOPY;  Surgeon: Irving Copas., MD;  Location: [place];  Service: Gastroenterology;  Laterality: N/A;   STENT REMOVAL  02/09/2018   Procedure: STENT REMOVAL;  Surgeon: Irving Copas., MD;  Location: [facility];  Service: Gastroenterology;;   Lavell Islam REMOVAL  03/30/2018   Procedure: STENT REMOVAL;  Surgeon: Irving Copas., MD;  Location[facility];  Service: Gastroenterology;;   Lavell Islam REMOVAL  06/05/2021   Procedure: STENT REMOVAL;  Surgeon: Irving Copas., MD;  Location: [place];  Service: Gastroenterology;;   TRANSTHORACIC ECHOCARDIOGRAM  04/2015   EF 40-45%, mod antsept, ant, antlat, apical HK, mild AI/MR, PASP 47mHg.   TRANSTHORACIC ECHOCARDIOGRAM  08/23/2020   EF 55 to 60%.  No RWMA.  Mild LVH.  GR 1 DD.  Normal RV size and function with normal RVP.  Normal mitral  and aortic valve.   UVULOPALATOPHARYNGOPLASTY, TONSILLECTOMY AND SEPTOPLASTY  2002    Social History   Socioeconomic History   Marital status: Married    Spouse name: Robyn   Number of children: 3   Years of education: Not on file   Highest education level: Not on file  Occupational History   Occupation: retired  Tobacco Use   Smoking status: Former    Types: Cigarettes    Quit date: 1972    Years since quitting: 52.1   Smokeless tobacco: Never   Tobacco comments:    was very light  Vaping Use   Vaping Use: Never used  Substance and Sexual Activity   Alcohol use: Not Currently    Comment: ocassional -maybe 2 beers   Drug use: No   Sexual activity: Yes  Other Topics Concern   Not on file  Social History Narrative   Caffeine: 1 cup soda, occasional coffee   Lives with wife [name]) and 42 yo son, 4 dogs   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   Activity: no regular activity   Diet: overall healthy, good fruits and vegetables, good amt water   Social Determinants of Health   Financial Resource Strain: Low Risk  (10/21/2021)   Overall Financial Resource Strain (CARDIA)    Difficulty of Paying Living Expenses: Not hard at all  Food Insecurity: No Food Insecurity (10/21/2021)   Hunger Vital Sign    Worried About Running Out of Food in the Last Year: Never true    Garden in the Last Year: Never true  Transportation Needs: No Transportation Needs (10/21/2021)   PRAPARE - Hydrologist (Medical): No    Lack of Transportation (Non-Medical): No  Physical Activity: Insufficiently Active (10/21/2021)   Exercise Vital Sign    Days of Exercise per Week: 4 days    Minutes of Exercise per Session: 30 min  Stress: No Stress Concern Present (10/21/2021)   [place]    Feeling of Stress : Not at all  Social Connections: Moderately Integrated (10/21/2021)   Social Connection and Isolation Panel [NHANES]    Frequency of Communication with Friends and Family: More than three times a week    Frequency of Social Gatherings with Friends and Family: Twice a week    Attends Religious Services: Never    Marine scientist or Organizations: Yes    Attends Music therapist: More than 4 times per year    Marital Status: Married  Human resources officer Violence: Not At Risk (10/21/2021)   Humiliation, Afraid, Rape, and Kick questionnaire    Fear of Current or Ex-Partner: No    Emotionally Abused: No    Physically Abused: No    Sexually Abused: No     No Known Allergies   Outpatient Medications Prior to Visit  Medication Sig Dispense Refill   acetaminophen (TYLENOL) 500 MG tablet Take 1,000 mg by mouth every 6 (six) hours as needed for moderate pain or  headache.      Ascorbic Acid (VITAMIN C) 1000 MG tablet Take 1,000 mg by mouth daily.     aspirin EC 81 MG tablet Take 81 mg by mouth daily. Swallow whole.     Carboxymethylcellulose Sodium (LUBRICANT EYE DROPS OP) Place 2 drops into both eyes daily as needed (for dry eyes).     Cholecalciferol (VITAMIN D3)  50 MCG (2000 UT) TABS Take 2,000 Units by mouth daily.     gabapentin (NEURONTIN) 600 MG tablet TAKE 2 TABLETS BY MOUTH 3 TIMES  DAILY 540 tablet 2   ibuprofen (ADVIL) 200 MG tablet Take 400 mg by mouth every 6 (six) hours as needed for headache or moderate pain.     Magnesium 250 MG TABS Take 1 tablet (250 mg total) by mouth at bedtime. (Patient taking differently: Take 250 mg by mouth at bedtime.)  0   mirabegron ER (MYRBETRIQ) 25 MG TB24 tablet TAKE 1 TABLET (25 MG TOTAL) BY MOUTH DAILY. 90 tablet 1   Multiple Vitamin (MULTIVITAMIN WITH MINERALS) TABS tablet Take 1 tablet by mouth daily.     Omega-3 Fatty Acids (FISH OIL) 1000 MG CAPS Take 1,000 mg by mouth daily.      omeprazole (PRILOSEC) 40 MG capsule TAKE 1 CAPSULE (40 MG TOTAL) BY MOUTH 2 (TWO) TIMES DAILY BEFORE A MEAL. 180 capsule 0   rosuvastatin (CRESTOR) 40 MG tablet TAKE 1 TABLET BY MOUTH DAILY 90 tablet 1   sildenafil (VIAGRA) 100 MG tablet TAKE 1/2 TO 1 TABLET BY MOUTH DAILY AS NEEDED FOR ERECTILE DYSFUNCTION (Patient taking differently: Take 50-100 mg by mouth daily as needed for erectile dysfunction.) 6 tablet 0   tamsulosin (FLOMAX) 0.4 MG CAPS capsule TAKE 1 CAPSULE BY MOUTH EVERY DAY 90 capsule 3   vitamin B-12 (CYANOCOBALAMIN) 1000 MCG tablet Take 1,000 mcg by mouth daily.     vitamin E 400 UNIT capsule Take 400 Units by mouth daily.     doxycycline (VIBRA-TABS) 100 MG tablet Take 1 tablet (100 mg total) by mouth 2 (two) times daily. (Patient not taking: Reported on 07/17/2022) 14 tablet 0   predniSONE (DELTASONE) 20 MG tablet Take 2 tablets (40 mg total) by mouth daily. For 3 days, then 1 tab daily for 3 days (Patient not  taking: Reported on 07/17/2022) 9 tablet 0   No facility-administered medications prior to visit.       Objective:   Physical Exam:  General appearance: 72 y.o., male, NAD, conversant  Eyes: anicteric sclerae; PERRL, tracking appropriately HENT: NCAT; MMM Neck: Trachea midline; no lymphadenopathy, no JVD Lungs: CTAB, no crackles, no wheeze, with normal respiratory effort CV: RRR, no murmur  Abdomen: Soft, non-tender; non-distended, BS present  Extremities: No peripheral edema, warm Skin: Normal turgor and texture; no rash Psych: Appropriate affect Neuro: Alert and oriented to person and place, no focal deficit     Vitals:   07/17/22 1315  BP: 118/74  Pulse: 74  Temp: 97.7 F (36.5 C)  TempSrc: Oral  SpO2: 96%  Weight: 223 lb 9.6 oz (101.4 kg)  Height: 5' 10"$  (1.778 m)   96% on RA BMI Readings from Last 3 Encounters:  07/17/22 32.08 kg/m  05/29/22 30.96 kg/m  05/08/22 31.49 kg/m   Wt Readings from Last 3 Encounters:  07/17/22 223 lb 9.6 oz (101.4 kg)  05/29/22 222 lb (100.7 kg)  05/08/22 225 lb 12.8 oz (102.4 kg)     CBC    Component Value Date/Time   WBC 7.1 05/04/2022 1143   RBC 4.51 05/04/2022 1143   HGB 14.8 05/04/2022 1143   HCT 42.9 05/04/2022 1143   PLT 223.0 05/04/2022 1143   MCV 95.0 05/04/2022 1143   MCH 32.2 04/03/2021 0037   MCHC 34.4 05/04/2022 1143   RDW 13.5 05/04/2022 1143   LYMPHSABS 2.6 05/04/2022 1143   MONOABS 0.5 05/04/2022 1143   EOSABS 0.4  05/04/2022 1143   BASOSABS 0.1 05/04/2022 1143    Chest Imaging: CXR 05/29/22 with low lung volumes, coarse interstitial markings  CT AP 06/20/21 with bilateral lower lobe predominant fibrosis, reticular opacities, no honeycombing  Pulmonary Functions Testing Results:    Latest Ref Rng & Units 08/01/2015    2:22 PM  PFT Results  FVC-Pre L 5.42  P  FVC-Predicted Pre % 113  P  FVC-Post L 5.35  P  FVC-Predicted Post % 112  P  Pre FEV1/FVC % % 85  P  Post FEV1/FCV % % 87  P   FEV1-Pre L 4.58  P  FEV1-Predicted Pre % 129  P  FEV1-Post L 4.65  P  DLCO uncorrected ml/min/mmHg 20.37  P  DLCO UNC% % 60  P  DLVA Predicted % 57  P  TLC L 7.55  P  TLC % Predicted % 104  P  RV % Predicted % 82  P    P Preliminary result   PFT 2017 with mildly reduced diffusing capacity    Echocardiogram:    1. Left ventricular ejection fraction, by estimation, is 55 to 60%. The  left ventricle has normal function. The left ventricle has no regional  wall motion abnormalities. There is moderate left ventricular hypertrophy.  Left ventricular diastolic  parameters are consistent with Grade I diastolic dysfunction (impaired  relaxation).   2. Right ventricular systolic function is normal. The right ventricular  size is normal.   3. The mitral valve is normal in structure. Trivial mitral valve  regurgitation. No evidence of mitral stenosis.   4. The aortic valve is tricuspid. Aortic valve regurgitation is mild. No  aortic stenosis is present.       Assessment & Plan:   # Chronic cough Components of likely PND, GERD though worry about progressive fibrotic lung disease as below  # ILD Unclear etiology. In past attributed to ARDS/ALI though doesn't look like he had ARDS or significant lung injury when he was hospitalized in 2016. Had about 6 months of amiodarone exposure in 2016 for AF. Nothing concerning by history for underlying CTD. Potentially related to severe reflux/microaspiration given lower lobe predominance.  # OSA not on CPAP   Plan: - labs today - complete ILD packet and bring to next visit - you'll be called to schedule CT Chest - would stay on flonase daily - take shower, clear nose of crusting then 1 spray each nostril daily till next visit for postnasal drainage - breathing tests next visit - let me know if you'd like to get sleep study - machines are quieter and masks/hoses are better than they used to be     Maryjane Hurter, MD [place]  [facility] 07/17/2022 1:30 PM

## 2022-07-17 ENCOUNTER — Ambulatory Visit: Payer: Medicare Other | Admitting: Student

## 2022-07-17 ENCOUNTER — Encounter: Payer: Self-pay | Admitting: Student

## 2022-07-17 VITALS — BP 118/74 | HR 74 | Temp 97.7°F | Ht 70.0 in | Wt 223.6 lb

## 2022-07-17 DIAGNOSIS — R053 Chronic cough: Secondary | ICD-10-CM

## 2022-07-17 DIAGNOSIS — R0609 Other forms of dyspnea: Secondary | ICD-10-CM

## 2022-07-17 LAB — CK: Total CK: 464 U/L — ABNORMAL HIGH (ref 7–232)

## 2022-07-17 NOTE — Patient Instructions (Addendum)
-   labs today - complete ILD packet and bring to next visit - you'll be called to schedule CT Chest - would stay on flonase daily - take shower, clear nose of crusting then 1 spray each nostril daily till next visit for postnasal drainage - breathing tests next visit - let me know if you'd like to get sleep study - machines are quieter and masks/hoses are better than they used to be

## 2022-07-19 LAB — CYCLIC CITRUL PEPTIDE ANTIBODY, IGG: Cyclic Citrullin Peptide Ab: <16 UNITS

## 2022-07-19 LAB — RHEUMATOID FACTOR: Rheumatoid fact SerPl-aCnc: <14 IU/mL (ref <14)

## 2022-07-22 LAB — ANA+ENA+DNA/DS+SCL 70+SJOSSA/B
ANA Titer 1: NEGATIVE
ENA RNP Ab: <0.2 AI (ref 0.0–0.9)
ENA SM Ab Ser-aCnc: <0.2 AI (ref 0.0–0.9)
ENA SSA (RO) Ab: <0.2 AI (ref 0.0–0.9)
ENA SSB (LA) Ab: <0.2 AI (ref 0.0–0.9)
Scleroderma (Scl-70) (ENA) Antibody, IgG: <0.2 AI (ref 0.0–0.9)
dsDNA Ab: 2 IU/mL (ref 0–9)

## 2022-07-24 ENCOUNTER — Ambulatory Visit
Admission: RE | Admit: 2022-07-24 | Discharge: 2022-07-24 | Disposition: A | Discharge status: Home / Self Care | Payer: Medicare Other | Source: Ambulatory Visit | Attending: Student | Admitting: Student

## 2022-07-24 DIAGNOSIS — I2510 Atherosclerotic heart disease of native coronary artery without angina pectoris: Secondary | ICD-10-CM | POA: Diagnosis not present

## 2022-07-24 DIAGNOSIS — Z9049 Acquired absence of other specified parts of digestive tract: Secondary | ICD-10-CM | POA: Diagnosis not present

## 2022-07-24 DIAGNOSIS — J479 Bronchiectasis, uncomplicated: Secondary | ICD-10-CM | POA: Diagnosis not present

## 2022-07-24 DIAGNOSIS — R0609 Other forms of dyspnea: Secondary | ICD-10-CM

## 2022-07-24 DIAGNOSIS — I700 Atherosclerosis of aorta: Secondary | ICD-10-CM | POA: Diagnosis not present

## 2022-08-01 ENCOUNTER — Other Ambulatory Visit: Payer: Self-pay | Admitting: Gastroenterology

## 2022-08-09 NOTE — Progress Notes (Deleted)
Synopsis: Referred for chronic cough, post covid issues by [name], MD  Subjective:   PATIENT ID: Alexander [name] GENDER: male DOB: January 31, 1951, MRN: [id number]  No chief complaint on file.  72yM with history of CAD, HTN, ICM, OSA not on CPAP, pAF, very light smoking in past. Had covid-19 a year ago in January was not hospitalized with it.   Coughing, short of breath, little energy over last 4 months. Cough is dry occasionally productive of sputum but he just swallows it. He does have sinus congestion and postnasal drainage. He tried flonase on and off since November. Benadryl here and there. He is on omeprazole BID for reflux. No solid/liquid food dysphagia.   Got steroid course in October/Nov, steroids did not help. Had another round of steroids/antibiotic which did not help.   Had polio as a kid and started to need braces/crutches in 2002.   Some morning stiffness but takes 10-15 min to loosen up, no rashes, raynaud's phenomenon, ulcers in mouth or nose.   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 Some exposure to cleaning agents in past, some solvents. No MJ, vaping. Born in [place], lived for [place] in [place] and then was in [place] in [place], moved to [place]. 3 dogs. No hot tub.   Interval HPI: CK 464, otherwise CTD serologies neg  Was to fill out ILD packet  HRCT with widespread reticular opacities, subpleural fibrosis with traction bronchiolectasis without craniocaudal gradient, mild air trapping  PFTs  Started on flonase last visit in case PND contributing to UACS/chronic cough  Otherwise pertinent review of systems is negative.   Past Medical History:  Diagnosis Date   Acute cholecystitis 03/2016   with sepsis   Arthritis    CAD S/P percutaneous coronary angioplasty 1995, 2000, 2005   a. 1995 s/p BMS;  b. 2000 s/p BMS;  c. 2005 s/p stent - All stents in [facility] (RCA, LAD & OM - unknown  on which date);  d. 04/2015 NSTEMI/Cath: LM nl, ost/p LAD 20%, patent mLAD stent, RI small, OM2 patent stent, pRCA 20%, patent stent, EF 45-50%-->Med Rx.   Calculus of bile duct with acute cholangitis with obstruction    Chronic pain syndrome    a. Followed @ [facility];  b. Uses 12-14 excedrins per day.   Complication of anesthesia 04/01/2021   agiated- "battling nurses"   Duodenal stricture    Essential hypertension    Fatty liver    Gallstones    GERD (gastroesophageal reflux disease)    History of depression    History of post poliomyelitis muscular atrophy    HLD (hyperlipidemia)    Ischemic cardiomyopathy    a. 04/2015 Echo: EF 40-45%, mod antsept, ant, antlat, apical HK, mild AI/MR, PASP 74mHg.   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 04/2015 in setting of cholecystits and sepsis -->Amio;  b. CHA2DS2VASc = 4--> Eliquis.   Pneumonia 04/2015   Post-polio syndrome    a. ambulates with braces.   Septic shock ([place] 01/07/2018   Shoulder pain, right      Family History  Problem Relation Age of Onset   Diabetes Mother    Heart disease Mother  Cancer Sister 68       male cancer   Breast cancer Brother 64   Hypertension Maternal Aunt    Cancer Maternal Uncle        spinal   Coronary artery disease Neg Hx    Stroke Neg Hx    Colon cancer Neg Hx    Pancreatic cancer Neg Hx    Stomach cancer Neg Hx    Esophageal cancer Neg Hx    Inflammatory bowel disease Neg Hx    Liver disease Neg Hx    Rectal cancer Neg Hx      Past Surgical History:  Procedure Laterality Date   BALLOON DILATION N/A 06/06/2018   Procedure: BALLOON DILATION;  Surgeon: Mansouraty, Telford Nab., MD;  Location: [place];  Service: Gastroenterology;  Laterality: N/A;   BALLOON DILATION N/A 07/06/2018   Procedure: BALLOON DILATION;  Surgeon:  Rush Landmark Telford Nab., MD;  Location: [place];  Service: Gastroenterology;  Laterality: N/A;   BALLOON DILATION N/A 07/24/2021   Procedure: BALLOON DILATION;  Surgeon: Rush Landmark Telford Nab., MD;  Location: [place];  Service: Gastroenterology;  Laterality: N/A;   BILIARY BRUSHING  06/05/2021   Procedure: BILIARY BRUSHING;  Surgeon: Rush Landmark Telford Nab., MD;  Location: [place];  Service: Gastroenterology;;   BILIARY DILATION  06/05/2021   Procedure: BILIARY DILATION;  Surgeon: Irving Copas., MD;  Location: [place];  Service: Gastroenterology;;   BILIARY DILATION  09/15/2021   Procedure: BILIARY DILATION;  Surgeon: Irving Copas., MD;  Location[facility];  Service: Gastroenterology;;   BILIARY STENT PLACEMENT  01/08/2018   Procedure: BILIARY STENT PLACEMENT;  Surgeon: Jackquline Denmark, MD;  Location: [place];  Service: Endoscopy;;   BILIARY STENT PLACEMENT N/A 02/09/2018   Procedure: BILIARY STENT PLACEMENT;  Surgeon: Irving Copas., MD;  Location: [facility];  Service: Gastroenterology;  Laterality: N/A;   BILIARY STENT PLACEMENT  04/01/2021   Procedure: BILIARY STENT PLACEMENT;  Surgeon: Jackquline Denmark, MD;  Location: [facility] ENDOSCOPY;  Service: Endoscopy;;   BIOPSY  06/06/2018   Procedure: BIOPSY;  Surgeon: Irving Copas., MD;  Location: [facility] ENDOSCOPY;  Service: Gastroenterology;;   BIOPSY  04/01/2021   Procedure: BIOPSY;  Surgeon: Jackquline Denmark, MD;  Location: [facility] END[facility]COPY;  Service: Endoscopy;;   BIOPSY  06/05/2021   Procedure: BIOPSY;  Surgeon: Irving Copas., MD;  Location: [facility] ENDOSCOPY;  Service: Gastroenterology;;   CARDIAC CATHETERIZATION N/A 04/30/2015   Procedure: Left Heart Cath and Coronary Angiography;  Surgeon: Troy Sine, MD;  Location: [place] [facility] LAB;  Service: Cardiovascular; LM nl, ost/p LAD 20%, patent mLAD stent, RI small, OM2 patent stent, pRCA 20%, patent stent, EF 45-50%-->Med Rx     cervical spine stimulator  08/2012   CHOLECYSTECTOMY  06/10/2015   Procedure: LAPAROSCOPIC CHOLECYSTECTOMY;  Surgeon: Arta Bruce Kinsinger, MD;  Location: [place];  Service: General;;   COLONOSCOPY  09/2013   TA x2, rpt 5 yrs ([place])   COLONOSCOPY WITH ESOPHAGOGASTRODUODENOSCOPY (EGD)  06/2018   reactive gastropathy, tubular adenoma, rpt colonoscopy 5 yrs (Mansouraty)   COLONOSCOPY WITH PROPOFOL N/A 06/06/2018   Procedure: COLONOSCOPY WITH PROPOFOL;  Surgeon: Irving Copas., MD;  Location: [place];  Service: Gastroenterology;  Laterality: N/A;   CORONARY ANGIOPLASTY WITH STENT PLACEMENT  1995, 2000, 2005   stents in RCA, LAD & Cx-OM. -Stents were patent by cardiac catheterization in November 2016   ENDOSCOPIC RETROGRADE CHOLANGIOPANCREATOGRAPHY (ERCP) WITH PROPOFOL N/A 02/09/2018   Procedure: ENDOSCOPIC RETROGRADE CHOLANGIOPANCREATOGRAPHY (ERCP) WITH PROPOFOL;  Surgeon: Irving Copas., MD;  Location: [facility];  Service: Gastroenterology;  Laterality: N/A;   ENDOSCOPIC RETROGRADE CHOLANGIOPANCREATOGRAPHY (ERCP) WITH PROPOFOL N/A 03/30/2018   Procedure: ENDOSCOPIC RETROGRADE CHOLANGIOPANCREATOGRAPHY (ERCP) WITH PROPOFOL;  Surgeon: Rush Landmark Telford Nab., MD;  Location: [facility];  Service: Gastroenterology;  Laterality: N/A;   ENDOSCOPIC RETROGRADE CHOLANGIOPANCREATOGRAPHY (ERCP) WITH PROPOFOL N/A 06/05/2021   Procedure: ENDOSCOPIC RETROGRADE CHOLANGIOPANCREATOGRAPHY (ERCP) WITH PROPOFOL;  Surgeon: Rush Landmark Telford Nab., MD;  Location: [place];  Service: Gastroenterology;  Laterality: N/A;   ENDOSCOPIC RETROGRADE CHOLANGIOPANCREATOGRAPHY (ERCP) WITH PROPOFOL N/A 09/15/2021   Procedure: ENDOSCOPIC RETROGRADE CHOLANGIOPANCREATOGRAPHY (ERCP) WITH PROPOFOL;  Surgeon: Rush Landmark Telford Nab., MD;  Location: [facility];  Service: Gastroenterology;  Laterality: N/A;   ERCP N/A 01/08/2018   Procedure: ENDOSCOPIC RETROGRADE CHOLANGIOPANCREATOGRAPHY (ERCP);  Surgeon:  Jackquline Denmark, MD;  Location: [facility] ENDOSCOPY;  Service: Endoscopy;  Laterality: N/A;   ERCP N/A 04/01/2021   Procedure: ENDOSCOPIC RETROGRADE CHOLANGIOPANCREATOGRAPHY (ERCP);  Surgeon: Jackquline Denmark, MD;  Location: [facility] ENDOSCOPY;  Service: Endoscopy;  Laterality: N/A;  Please schedule after 1 PM   ESOPHAGOGASTRODUODENOSCOPY N/A 03/30/2018   Procedure: ESOPHAGOGASTRODUODENOSCOPY (EGD);  Surgeon: Irving Copas., MD;  Location: [facility];  Service: Gastroenterology;  Laterality: N/A;   ESOPHAGOGASTRODUODENOSCOPY (EGD) WITH PROPOFOL N/A 06/06/2018   Procedure: ESOPHAGOGASTRODUODENOSCOPY (EGD) WITH PROPOFOL;  Surgeon: Rush Landmark Telford Nab., MD;  Location: [place];  Service: Gastroenterology;  Laterality: N/A;   ESOPHAGOGASTRODUODENOSCOPY (EGD) WITH PROPOFOL N/A 07/06/2018   Procedure: ESOPHAGOGASTRODUODENOSCOPY (EGD) WITH PROPOFOL;  Surgeon: Rush Landmark Telford Nab., MD;  Location: [place];  Service: Gastroenterology;  Laterality: N/A;   ESOPHAGOGASTRODUODENOSCOPY (EGD) WITH PROPOFOL N/A 07/24/2021   Procedure: ESOPHAGOGASTRODUODENOSCOPY (EGD) WITH PROPOFOL WITH FLUOROSCOPY;  Surgeon: Rush Landmark Telford Nab., MD;  Location: [place];  Service: Gastroenterology;  Laterality: N/A;   EUS N/A 07/24/2021   Procedure: UPPER ENDOSCOPIC ULTRASOUND (EUS) RADIAL;  Surgeon: Irving Copas., MD;  Location: [place];  Service: Gastroenterology;  Laterality: N/A;   lumbar spine stimulator     POLYPECTOMY  06/06/2018   Procedure: POLYPECTOMY;  Surgeon: Mansouraty, Telford Nab., MD;  Location: [place];  Service: Gastroenterology;;   REMOVAL OF STONES  03/30/2018   Procedure: REMOVAL OF STONES;  Surgeon: Irving Copas., MD;  Location: [facility];  Service: Gastroenterology;;   REMOVAL OF STONES  06/05/2021   Procedure: REMOVAL OF STONES;  Surgeon: Irving Copas., MD;  Location: [place];  Service: Gastroenterology;;   REMOVAL OF STONES  09/15/2021    Procedure: REMOVAL OF STONES;  Surgeon: Irving Copas., MD;  Location: [facility];  Service: Gastroenterology;;   Joan Mayans  01/08/2018   Procedure: Joan Mayans;  Surgeon: Jackquline Denmark, MD;  Location: [place];  Service: Endoscopy;;   SPHINCTEROTOMY  02/09/2018   Procedure: Joan Mayans;  Surgeon: Mansouraty, Telford Nab., MD;  Location: [facility];  Service: Gastroenterology;;   [facility] CHOLANGIOSCOPY N/A 06/05/2021   Procedure: [id number] CHOLANGIOSCOPY;  Surgeon: Irving Copas., MD;  Location: [place];  Service: Gastroenterology;  Laterality: N/A;   STENT REMOVAL  02/09/2018   Procedure: STENT REMOVAL;  Surgeon: Irving Copas., MD;  Location[facility];  Service: Gastroenterology;;   Lavell Islam REMOVAL  03/30/2018   Procedure: STENT REMOVAL;  Surgeon: Irving Copas., MD;  Location: [facility];  Service: Gastroenterology;;   Lavell Islam REMOVAL  06/05/2021   Procedure: STENT REMOVAL;  Surgeon: Irving Copas., MD;  Location: [place];  Service: Gastroenterology;;   TRANSTHORACIC ECHOCARDIOGRAM  04/2015   EF 40-45%, mod antsept, ant, antlat, apical HK, mild AI/MR, PASP  34mHg.   TRANSTHORACIC ECHOCARDIOGRAM  08/23/2020   EF 55 to 60%.  No RWMA.  Mild LVH.  GR 1 DD.  Normal RV size and function with normal RVP.  Normal mitral and aortic valve.   UVULOPALATOPHARYNGOPLASTY, TONSILLECTOMY AND SEPTOPLASTY  2002    Social History   Socioeconomic History   Marital status: Married    Spouse name: Robyn   Number of children: 3   Years of education: Not on file   Highest education level: Not on file  Occupational History   Occupation: retired  Tobacco Use   Smoking status: Former    Types: Cigarettes    Quit date: 1972    Years since quitting: 52.2   Smokeless tobacco: Never   Tobacco comments:    was very light  Vaping Use   Vaping Use: Never used  Substance and Sexual Activity   Alcohol use: Not Currently    Comment:  ocassional -maybe 2 beers   Drug use: No   Sexual activity: Yes  Other Topics Concern   Not on file  Social History Narrative   Caffeine: 1 cup soda, occasional coffee   Lives with wife ([name] and 167yo son, 4 dogs   Previously worked for [employer] 2002 for post-polio syndrome   Activity: no regular activity   Diet: overall healthy, good fruits and vegetables, good amt water   Social Determinants of Health   Financial Resource Strain: Low Risk  (10/21/2021)   Overall Financial Resource Strain (CARDIA)    Difficulty of Paying Living Expenses: Not hard at all  Food Insecurity: No Food Insecurity (10/21/2021)   Hunger Vital Sign    Worried About Running Out of Food in the Last Year: Never true    RLisbon Fallsin the Last Year: Never true  Transportation Needs: No Transportation Needs (10/21/2021)   PRAPARE - THydrologist(Medical): No    Lack of Transportation (Non-Medical): No  Physical Activity: Insufficiently Active (10/21/2021)   Exercise Vital Sign    Days of Exercise per Week: 4 days    Minutes of Exercise per Session: 30 min  Stress: No Stress Concern Present (10/21/2021)   FPotosi   Feeling of Stress : Not at all  Social Connections: Moderately Integrated (10/21/2021)   Social Connection and Isolation Panel [NHANES]    Frequency of Communication with Friends and Family: More than three times a week    Frequency of Social Gatherings with Friends and Family: Twice a week    Attends Religious Services: Never    AMarine scientistor Organizations: Yes    Attends CMusic therapist More than 4 times per year    Marital Status: Married  IHuman resources officerViolence: Not At Risk (10/21/2021)   Humiliation, Afraid, Rape, and Kick questionnaire    Fear of Current or Ex-Partner: No    Emotionally Abused: No    Physically Abused: No     Sexually Abused: No     No Known Allergies   Outpatient Medications Prior to Visit  Medication Sig Dispense Refill   acetaminophen (TYLENOL) 500 MG tablet Take 1,000 mg by mouth every 6 (six) hours as needed for moderate pain or headache.      Ascorbic Acid (VITAMIN C) 1000 MG tablet Take 1,000 mg by mouth daily.     aspirin EC 81 MG tablet Take 81  mg by mouth daily. Swallow whole.     Carboxymethylcellulose Sodium (LUBRICANT EYE DROPS OP) Place 2 drops into both eyes daily as needed (for dry eyes).     Cholecalciferol (VITAMIN D3) 50 MCG (2000 UT) TABS Take 2,000 Units by mouth daily.     doxycycline (VIBRA-TABS) 100 MG tablet Take 1 tablet (100 mg total) by mouth 2 (two) times daily. (Patient not taking: Reported on 07/17/2022) 14 tablet 0   gabapentin (NEURONTIN) 600 MG tablet TAKE 2 TABLETS BY MOUTH 3 TIMES  DAILY 540 tablet 2   ibuprofen (ADVIL) 200 MG tablet Take 400 mg by mouth every 6 (six) hours as needed for headache or moderate pain.     Magnesium 250 MG TABS Take 1 tablet (250 mg total) by mouth at bedtime. (Patient taking differently: Take 250 mg by mouth at bedtime.)  0   mirabegron ER (MYRBETRIQ) 25 MG TB24 tablet TAKE 1 TABLET (25 MG TOTAL) BY MOUTH DAILY. 90 tablet 1   Multiple Vitamin (MULTIVITAMIN WITH MINERALS) TABS tablet Take 1 tablet by mouth daily.     Omega-3 Fatty Acids (FISH OIL) 1000 MG CAPS Take 1,000 mg by mouth daily.      omeprazole (PRILOSEC) 40 MG capsule TAKE 1 CAPSULE (40 MG TOTAL) BY MOUTH 2 (TWO) TIMES DAILY BEFORE A MEAL. 180 capsule 0   predniSONE (DELTASONE) 20 MG tablet Take 2 tablets (40 mg total) by mouth daily. For 3 days, then 1 tab daily for 3 days (Patient not taking: Reported on 07/17/2022) 9 tablet 0   rosuvastatin (CRESTOR) 40 MG tablet TAKE 1 TABLET BY MOUTH DAILY 90 tablet 1   sildenafil (VIAGRA) 100 MG tablet TAKE 1/2 TO 1 TABLET BY MOUTH DAILY AS NEEDED FOR ERECTILE DYSFUNCTION (Patient taking differently: Take 50-100 mg by mouth daily as  needed for erectile dysfunction.) 6 tablet 0   tamsulosin (FLOMAX) 0.4 MG CAPS capsule TAKE 1 CAPSULE BY MOUTH EVERY DAY 90 capsule 3   vitamin B-12 (CYANOCOBALAMIN) 1000 MCG tablet Take 1,000 mcg by mouth daily.     vitamin E 400 UNIT capsule Take 400 Units by mouth daily.     No facility-administered medications prior to visit.       Objective:   Physical Exam:  General appearance: 72 y.o., male, NAD, conversant  Eyes: anicteric sclerae; PERRL, tracking appropriately HENT: NCAT; MMM Neck: Trachea midline; no lymphadenopathy, no JVD Lungs: CTAB, no crackles, no wheeze, with normal respiratory effort CV: RRR, no murmur  Abdomen: Soft, non-tender; non-distended, BS present  Extremities: No peripheral edema, warm Skin: Normal turgor and texture; no rash Psych: Appropriate affect Neuro: Alert and oriented to person and place, no focal deficit     There were no vitals filed for this visit.    on RA BMI Readings from Last 3 Encounters:  07/17/22 32.08 kg/m  05/29/22 30.96 kg/m  05/08/22 31.49 kg/m   Wt Readings from Last 3 Encounters:  07/17/22 223 lb 9.6 oz (101.4 kg)  05/29/22 222 lb (100.7 kg)  05/08/22 225 lb 12.8 oz (102.4 kg)     CBC    Component Value Date/Time   WBC 7.1 05/04/2022 1143   RBC 4.51 05/04/2022 1143   HGB 14.8 05/04/2022 1143   HCT 42.9 05/04/2022 1143   PLT 223.0 05/04/2022 1143   MCV 95.0 05/04/2022 1143   MCH 32.2 04/03/2021 0037   MCHC 34.4 05/04/2022 1143   RDW 13.5 05/04/2022 1143   LYMPHSABS 2.6 05/04/2022 1143   MONOABS 0.5  05/04/2022 1143   EOSABS 0.4 05/04/2022 1143   BASOSABS 0.1 05/04/2022 1143    Chest Imaging: CXR 05/29/22 with low lung volumes, coarse interstitial markings  CT AP 06/20/21 with bilateral lower lobe predominant fibrosis, reticular opacities, no honeycombing  HRCT with widespread reticular opacities, subpleural fibrosis with traction bronchiolectasis without craniocaudal gradient, mild air  trapping  Pulmonary Functions Testing Results:    Latest Ref Rng & Units 08/01/2015    2:22 PM  PFT Results  FVC-Pre L 5.42  P  FVC-Predicted Pre % 113  P  FVC-Post L 5.35  P  FVC-Predicted Post % 112  P  Pre FEV1/FVC % % 85  P  Post FEV1/FCV % % 87  P  FEV1-Pre L 4.58  P  FEV1-Predicted Pre % 129  P  FEV1-Post L 4.65  P  DLCO uncorrected ml/min/mmHg 20.37  P  DLCO UNC% % 60  P  DLVA Predicted % 57  P  TLC L 7.55  P  TLC % Predicted % 104  P  RV % Predicted % 82  P    P Preliminary result   PFT 2017 with mildly reduced diffusing capacity    Echocardiogram:    1. Left ventricular ejection fraction, by estimation, is 55 to 60%. The  left ventricle has normal function. The left ventricle has no regional  wall motion abnormalities. There is moderate left ventricular hypertrophy.  Left ventricular diastolic  parameters are consistent with Grade I diastolic dysfunction (impaired  relaxation).   2. Right ventricular systolic function is normal. The right ventricular  size is normal.   3. The mitral valve is normal in structure. Trivial mitral valve  regurgitation. No evidence of mitral stenosis.   4. The aortic valve is tricuspid. Aortic valve regurgitation is mild. No  aortic stenosis is present.       Assessment & Plan:   # Chronic cough Components of likely PND, GERD though worry about progressive fibrotic lung disease as below  # ILD Unclear etiology. In past attributed to ARDS/ALI though doesn't look like he had ARDS or significant lung injury when he was hospitalized in 2016. Had about 6 months of amiodarone exposure in 2016 for AF. Nothing concerning by history for underlying CTD. Potentially related to severe reflux/microaspiration given lower lobe predominance.  # OSA not on CPAP   Plan: - labs today - complete ILD packet and bring to next visit - you'll be called to schedule CT Chest - would stay on flonase daily - take shower, clear nose of crusting then  1 spray each nostril daily till next visit for postnasal drainage - breathing tests next visit - let me know if you'd like to get sleep study - machines are quieter and masks/hoses are better than they used to be     Maryjane Hurter, MD [facility] 08/09/2022 6:26 PM

## 2022-08-09 NOTE — Progress Notes (Deleted)
Synopsis: Referred for chronic cough, post covid issues by [name], MD  Subjective:   PATIENT ID: Alexander [name] GENDER: male DOB: [birth year], MRN: [id number]  No chief complaint on file.  72yM with history of CAD, HTN, ICM, OSA not on CPAP, pAF, very light smoking in past. Had covid-19 a year ago in January was not hospitalized with it.   Coughing, short of breath, little energy over last 4 months. Cough is dry occasionally productive of sputum but he just swallows it. He does have sinus congestion and postnasal drainage. He tried flonase on and off since November. Benadryl here and there. He is on omeprazole BID for reflux. No solid/liquid food dysphagia.   Got steroid course in October/Nov, steroids did not help. Had another round of steroids/antibiotic which did not help.   Had polio as a kid and started to need braces/crutches in 2002.   Some morning stiffness but takes 10-15 min to loosen up, no rashes, raynaud's phenomenon, ulcers in mouth or nose.   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 Some exposure to cleaning agents in past, some solvents. No MJ, vaping. Born in [place], lived for [place] in [place] and then was in [place] in [place], moved to [place]. 3 dogs. No hot tub.   Interval HPI: CK 464, otherwise CTD serologies neg  Was to fill out ILD packet  HRCT with widespread reticular opacities, subpleural fibrosis with traction bronchiolectasis without craniocaudal gradient, mild air trapping  PFTs  Started on flonase last visit in case PND contributing to UACS/chronic cough  Otherwise pertinent review of systems is negative.   Past Medical History:  Diagnosis Date   Acute cholecystitis 03/2016   with sepsis   Arthritis    CAD S/P percutaneous coronary angioplasty 1995, 2000, 2005   a. 1995 s/p BMS;  b. 2000 s/p BMS;  c. 2005 s/p stent - All stents in [facility] (RCA, LAD & OM - unknown  on which date);  d. 04/2015 NSTEMI/Cath: LM nl, ost/p LAD 20%, patent mLAD stent, RI small, OM2 patent stent, pRCA 20%, patent stent, EF 45-50%-->Med Rx.   Calculus of bile duct with acute cholangitis with obstruction    Chronic pain syndrome    a. Followed @ [facility];  b. Uses 12-14 excedrins per day.   Complication of anesthesia 04/01/2021   agiated- "battling nurses"   Duodenal stricture    Essential hypertension    Fatty liver    Gallstones    GERD (gastroesophageal reflux disease)    History of depression    History of post poliomyelitis muscular atrophy    HLD (hyperlipidemia)    Ischemic cardiomyopathy    a. 04/2015 Echo: EF 40-45%, mod antsept, ant, antlat, apical HK, mild AI/MR, PASP 35mHg.   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 04/2015 in setting of cholecystits and sepsis -->Amio;  b. CHA2DS2VASc = 4--> Eliquis.   Pneumonia 04/2015   Post-polio syndrome    a. ambulates with braces.   Septic shock ([place] 01/07/2018   Shoulder pain, right      Family History  Problem Relation Age of Onset   Diabetes Mother    Heart disease Mother  Cancer Sister 5       male cancer   Breast cancer Brother 16   Hypertension Maternal Aunt    Cancer Maternal Uncle        spinal   Coronary artery disease Neg Hx    Stroke Neg Hx    Colon cancer Neg Hx    Pancreatic cancer Neg Hx    Stomach cancer Neg Hx    Esophageal cancer Neg Hx    Inflammatory bowel disease Neg Hx    Liver disease Neg Hx    Rectal cancer Neg Hx      Past Surgical History:  Procedure Laterality Date   BALLOON DILATION N/A 06/06/2018   Procedure: BALLOON DILATION;  Surgeon: Mansouraty, Telford Nab., MD;  Location: [place];  Service: Gastroenterology;  Laterality: N/A;   BALLOON DILATION N/A 07/06/2018   Procedure: BALLOON DILATION;  Surgeon:  Rush Landmark Telford Nab., MD;  Location: [place];  Service: Gastroenterology;  Laterality: N/A;   BALLOON DILATION N/A 07/24/2021   Procedure: BALLOON DILATION;  Surgeon: Rush Landmark Telford Nab., MD;  Location: [place];  Service: Gastroenterology;  Laterality: N/A;   BILIARY BRUSHING  06/05/2021   Procedure: BILIARY BRUSHING;  Surgeon: Rush Landmark Telford Nab., MD;  Location: [place];  Service: Gastroenterology;;   BILIARY DILATION  06/05/2021   Procedure: BILIARY DILATION;  Surgeon: Irving Copas., MD;  Location: [place];  Service: Gastroenterology;;   BILIARY DILATION  09/15/2021   Procedure: BILIARY DILATION;  Surgeon: Irving Copas., MD;  Location[facility];  Service: Gastroenterology;;   BILIARY STENT PLACEMENT  01/08/2018   Procedure: BILIARY STENT PLACEMENT;  Surgeon: Jackquline Denmark, MD;  Location: [place];  Service: Endoscopy;;   BILIARY STENT PLACEMENT N/A 02/09/2018   Procedure: BILIARY STENT PLACEMENT;  Surgeon: Irving Copas., MD;  Location: [facility];  Service: Gastroenterology;  Laterality: N/A;   BILIARY STENT PLACEMENT  04/01/2021   Procedure: BILIARY STENT PLACEMENT;  Surgeon: Jackquline Denmark, MD;  Location: [facility]COPY;  Service: Endoscopy;;   BIOPSY  06/06/2018   Procedure: BIOPSY;  Surgeon: Irving Copas., MD;  Location: [facility] ENDOSCOPY;  Service: Gastroenterology;;   BIOPSY  04/01/2021   Procedure: BIOPSY;  Surgeon: Jackquline Denmark, MD;  Location: [facility] ENDOSCOPY;  Service: Endoscopy;;   BIOPSY  06/05/2021   Procedure: BIOPSY;  Surgeon: Irving Copas., MD;  Location: [facility] ENDOSCOPY;  Service: Gastroenterology;;   CARDIAC CATHETERIZATION N/A 04/30/2015   Procedure: Left Heart Cath and Coronary Angiography;  Surgeon: Troy Sine, MD;  Location: [place] [facility];  Service: Cardiovascular; LM nl, ost/p LAD 20%, patent mLAD stent, RI small, OM2 patent stent, pRCA 20%, patent stent, EF 45-50%-->Med Rx     cervical spine stimulator  08/2012   CHOLECYSTECTOMY  06/10/2015   Procedure: LAPAROSCOPIC CHOLECYSTECTOMY;  Surgeon: Arta Bruce Kinsinger, MD;  Location: [place];  Service: General;;   COLONOSCOPY  09/2013   TA x2, rpt 5 yrs ([place])   COLONOSCOPY WITH ESOPHAGOGASTRODUODENOSCOPY (EGD)  06/2018   reactive gastropathy, tubular adenoma, rpt colonoscopy 5 yrs (Mansouraty)   COLONOSCOPY WITH PROPOFOL N/A 06/06/2018   Procedure: COLONOSCOPY WITH PROPOFOL;  Surgeon: Irving Copas., MD;  Location: [place];  Service: Gastroenterology;  Laterality: N/A;   CORONARY ANGIOPLASTY WITH STENT PLACEMENT  1995, 2000, 2005   stents in RCA, LAD & Cx-OM. -Stents were patent by cardiac catheterization in November 2016   ENDOSCOPIC RETROGRADE CHOLANGIOPANCREATOGRAPHY (ERCP) WITH PROPOFOL N/A 02/09/2018   Procedure: ENDOSCOPIC RETROGRADE CHOLANGIOPANCREATOGRAPHY (ERCP) WITH PROPOFOL;  Surgeon: Irving Copas., MD;  Location[facility];  Service: Gastroenterology;  Laterality: N/A;   ENDOSCOPIC RETROGRADE CHOLANGIOPANCREATOGRAPHY (ERCP) WITH PROPOFOL N/A 03/30/2018   Procedure: ENDOSCOPIC RETROGRADE CHOLANGIOPANCREATOGRAPHY (ERCP) WITH PROPOFOL;  Surgeon: Rush Landmark Telford Nab., MD;  Location[facility];  Service: Gastroenterology;  Laterality: N/A;   ENDOSCOPIC RETROGRADE CHOLANGIOPANCREATOGRAPHY (ERCP) WITH PROPOFOL N/A 06/05/2021   Procedure: ENDOSCOPIC RETROGRADE CHOLANGIOPANCREATOGRAPHY (ERCP) WITH PROPOFOL;  Surgeon: Rush Landmark Telford Nab., MD;  Location: [place];  Service: Gastroenterology;  Laterality: N/A;   ENDOSCOPIC RETROGRADE CHOLANGIOPANCREATOGRAPHY (ERCP) WITH PROPOFOL N/A 09/15/2021   Procedure: ENDOSCOPIC RETROGRADE CHOLANGIOPANCREATOGRAPHY (ERCP) WITH PROPOFOL;  Surgeon: Rush Landmark Telford Nab., MD;  [facility];  Service: Gastroenterology;  Laterality: N/A;   ERCP N/A 01/08/2018   Procedure: ENDOSCOPIC RETROGRADE CHOLANGIOPANCREATOGRAPHY (ERCP);  Surgeon:  Jackquline Denmark, MD;  Location: [facility] ENDOSCOPY;  Service: Endoscopy;  Laterality: N/A;   ERCP N/A 04/01/2021   Procedure: ENDOSCOPIC RETROGRADE CHOLANGIOPANCREATOGRAPHY (ERCP);  Surgeon: Jackquline Denmark, MD;  Location: [facility] ENDOSCOPY;  Service: Endoscopy;  Laterality: N/A;  Please schedule after 1 PM   ESOPHAGOGASTRODUODENOSCOPY N/A 03/30/2018   Procedure: ESOPHAGOGASTRODUODENOSCOPY (EGD);  Surgeon: Irving Copas., MD;  Location[facility];  Service: Gastroenterology;  Laterality: N/A;   ESOPHAGOGASTRODUODENOSCOPY (EGD) WITH PROPOFOL N/A 06/06/2018   Procedure: ESOPHAGOGASTRODUODENOSCOPY (EGD) WITH PROPOFOL;  Surgeon: Rush Landmark Telford Nab., MD;  Location: [place];  Service: Gastroenterology;  Laterality: N/A;   ESOPHAGOGASTRODUODENOSCOPY (EGD) WITH PROPOFOL N/A 07/06/2018   Procedure: ESOPHAGOGASTRODUODENOSCOPY (EGD) WITH PROPOFOL;  Surgeon: Rush Landmark Telford Nab., MD;  Location: [place];  Service: Gastroenterology;  Laterality: N/A;   ESOPHAGOGASTRODUODENOSCOPY (EGD) WITH PROPOFOL N/A 07/24/2021   Procedure: ESOPHAGOGASTRODUODENOSCOPY (EGD) WITH PROPOFOL WITH FLUOROSCOPY;  Surgeon: Rush Landmark Telford Nab., MD;  Location: [place];  Service: Gastroenterology;  Laterality: N/A;   EUS N/A 07/24/2021   Procedure: UPPER ENDOSCOPIC ULTRASOUND (EUS) RADIAL;  Surgeon: Irving Copas., MD;  Location: [place];  Service: Gastroenterology;  Laterality: N/A;   lumbar spine stimulator     POLYPECTOMY  06/06/2018   Procedure: POLYPECTOMY;  Surgeon: Mansouraty, Telford Nab., MD;  Location: [place];  Service: Gastroenterology;;   REMOVAL OF STONES  03/30/2018   Procedure: REMOVAL OF STONES;  Surgeon: Irving Copas., MD;  Location: [facility];  Service: Gastroenterology;;   REMOVAL OF STONES  06/05/2021   Procedure: REMOVAL OF STONES;  Surgeon: Irving Copas., MD;  Location: [place];  Service: Gastroenterology;;   REMOVAL OF STONES  09/15/2021    Procedure: REMOVAL OF STONES;  Surgeon: Irving Copas., MD;  Location: [facility];  Service: Gastroenterology;;   Joan Mayans  01/08/2018   Procedure: Joan Mayans;  Surgeon: Jackquline Denmark, MD;  Location: [place];  Service: Endoscopy;;   SPHINCTEROTOMY  02/09/2018   Procedure: Joan Mayans;  Surgeon: Mansouraty, Telford Nab., MD;  Location: [facility];  Service: Gastroenterology;;   [facility] CHOLANGIOSCOPY N/A 06/05/2021   Procedure: [id number] CHOLANGIOSCOPY;  Surgeon: Irving Copas., MD;  Location: [place];  Service: Gastroenterology;  Laterality: N/A;   STENT REMOVAL  02/09/2018   Procedure: STENT REMOVAL;  Surgeon: Irving Copas., MD;  Location[facility];  Service: Gastroenterology;;   Lavell Islam REMOVAL  03/30/2018   Procedure: STENT REMOVAL;  Surgeon: Irving Copas., MD;  Location[facility];  Service: Gastroenterology;;   Lavell Islam REMOVAL  06/05/2021   Procedure: STENT REMOVAL;  Surgeon: Irving Copas., MD;  Location: [place];  Service: Gastroenterology;;   TRANSTHORACIC ECHOCARDIOGRAM  04/2015   EF 40-45%, mod antsept, ant, antlat, apical HK, mild AI/MR, PASP  1mHg.   TRANSTHORACIC ECHOCARDIOGRAM  08/23/2020   EF 55 to 60%.  No RWMA.  Mild LVH.  GR 1 DD.  Normal RV size and function with normal RVP.  Normal mitral and aortic valve.   UVULOPALATOPHARYNGOPLASTY, TONSILLECTOMY AND SEPTOPLASTY  2002    Social History   Socioeconomic History   Marital status: Married    Spouse name: Robyn   Number of children: 3   Years of education: Not on file   Highest education level: Not on file  Occupational History   Occupation: retired  Tobacco Use   Smoking status: Former    Types: Cigarettes    Quit date: 1972    Years since quitting: 52.2   Smokeless tobacco: Never   Tobacco comments:    was very light  Vaping Use   Vaping Use: Never used  Substance and Sexual Activity   Alcohol use: Not Currently    Comment:  ocassional -maybe 2 beers   Drug use: No   Sexual activity: Yes  Other Topics Concern   Not on file  Social History Narrative   Caffeine: 1 cup soda, occasional coffee   Lives with wife ([name] and 180yo son, 4 dogs   Previously worked for [employer] 2002 for post-polio syndrome   Activity: no regular activity   Diet: overall healthy, good fruits and vegetables, good amt water   Social Determinants of Health   Financial Resource Strain: Low Risk  (10/21/2021)   Overall Financial Resource Strain (CARDIA)    Difficulty of Paying Living Expenses: Not hard at all  Food Insecurity: No Food Insecurity (10/21/2021)   Hunger Vital Sign    Worried About Running Out of Food in the Last Year: Never true    RP[place] the Last Year: Never true  Transportation Needs: No Transportation Needs (10/21/2021)   PRAPARE - THydrologist(Medical): No    Lack of Transportation (Non-Medical): No  Physical Activity: Insufficiently Active (10/21/2021)   Exercise Vital Sign    Days of Exercise per Week: 4 days    Minutes of Exercise per Session: 30 min  Stress: No Stress Concern Present (10/21/2021)   [place]   Feeling of Stress : Not at all  Social Connections: Moderately Integrated (10/21/2021)   Social Connection and Isolation Panel [NHANES]    Frequency of Communication with Friends and Family: More than three times a week    Frequency of Social Gatherings with Friends and Family: Twice a week    Attends Religious Services: Never    AMarine scientistor Organizations: Yes    Attends CMusic therapist More than 4 times per year    Marital Status: Married  IHuman resources officerViolence: Not At Risk (10/21/2021)   Humiliation, Afraid, Rape, and Kick questionnaire    Fear of Current or Ex-Partner: No    Emotionally Abused: No    Physically Abused: No     Sexually Abused: No     No Known Allergies   Outpatient Medications Prior to Visit  Medication Sig Dispense Refill   acetaminophen (TYLENOL) 500 MG tablet Take 1,000 mg by mouth every 6 (six) hours as needed for moderate pain or headache.      Ascorbic Acid (VITAMIN C) 1000 MG tablet Take 1,000 mg by mouth daily.     aspirin EC 81 MG tablet Take 81  mg by mouth daily. Swallow whole.     Carboxymethylcellulose Sodium (LUBRICANT EYE DROPS OP) Place 2 drops into both eyes daily as needed (for dry eyes).     Cholecalciferol (VITAMIN D3) 50 MCG (2000 UT) TABS Take 2,000 Units by mouth daily.     doxycycline (VIBRA-TABS) 100 MG tablet Take 1 tablet (100 mg total) by mouth 2 (two) times daily. (Patient not taking: Reported on 07/17/2022) 14 tablet 0   gabapentin (NEURONTIN) 600 MG tablet TAKE 2 TABLETS BY MOUTH 3 TIMES  DAILY 540 tablet 2   ibuprofen (ADVIL) 200 MG tablet Take 400 mg by mouth every 6 (six) hours as needed for headache or moderate pain.     Magnesium 250 MG TABS Take 1 tablet (250 mg total) by mouth at bedtime. (Patient taking differently: Take 250 mg by mouth at bedtime.)  0   mirabegron ER (MYRBETRIQ) 25 MG TB24 tablet TAKE 1 TABLET (25 MG TOTAL) BY MOUTH DAILY. 90 tablet 1   Multiple Vitamin (MULTIVITAMIN WITH MINERALS) TABS tablet Take 1 tablet by mouth daily.     Omega-3 Fatty Acids (FISH OIL) 1000 MG CAPS Take 1,000 mg by mouth daily.      omeprazole (PRILOSEC) 40 MG capsule TAKE 1 CAPSULE (40 MG TOTAL) BY MOUTH 2 (TWO) TIMES DAILY BEFORE A MEAL. 180 capsule 0   predniSONE (DELTASONE) 20 MG tablet Take 2 tablets (40 mg total) by mouth daily. For 3 days, then 1 tab daily for 3 days (Patient not taking: Reported on 07/17/2022) 9 tablet 0   rosuvastatin (CRESTOR) 40 MG tablet TAKE 1 TABLET BY MOUTH DAILY 90 tablet 1   sildenafil (VIAGRA) 100 MG tablet TAKE 1/2 TO 1 TABLET BY MOUTH DAILY AS NEEDED FOR ERECTILE DYSFUNCTION (Patient taking differently: Take 50-100 mg by mouth daily as  needed for erectile dysfunction.) 6 tablet 0   tamsulosin (FLOMAX) 0.4 MG CAPS capsule TAKE 1 CAPSULE BY MOUTH EVERY DAY 90 capsule 3   vitamin B-12 (CYANOCOBALAMIN) 1000 MCG tablet Take 1,000 mcg by mouth daily.     vitamin E 400 UNIT capsule Take 400 Units by mouth daily.     No facility-administered medications prior to visit.       Objective:   Physical Exam:  General appearance: 71 y.o., male, NAD, conversant  Eyes: anicteric sclerae; PERRL, tracking appropriately HENT: NCAT; MMM Neck: Trachea midline; no lymphadenopathy, no JVD Lungs: CTAB, no crackles, no wheeze, with normal respiratory effort CV: RRR, no murmur  Abdomen: Soft, non-tender; non-distended, BS present  Extremities: No peripheral edema, warm Skin: Normal turgor and texture; no rash Psych: Appropriate affect Neuro: Alert and oriented to person and place, no focal deficit     There were no vitals filed for this visit.    on RA BMI Readings from Last 3 Encounters:  07/17/22 32.08 kg/m  05/29/22 30.96 kg/m  05/08/22 31.49 kg/m   Wt Readings from Last 3 Encounters:  07/17/22 223 lb 9.6 oz (101.4 kg)  05/29/22 222 lb (100.7 kg)  05/08/22 225 lb 12.8 oz (102.4 kg)     CBC    Component Value Date/Time   WBC 7.1 05/04/2022 1143   RBC 4.51 05/04/2022 1143   HGB 14.8 05/04/2022 1143   HCT 42.9 05/04/2022 1143   PLT 223.0 05/04/2022 1143   MCV 95.0 05/04/2022 1143   MCH 32.2 04/03/2021 0037   MCHC 34.4 05/04/2022 1143   RDW 13.5 05/04/2022 1143   LYMPHSABS 2.6 05/04/2022 1143   MONOABS 0.5  05/04/2022 1143   EOSABS 0.4 05/04/2022 1143   BASOSABS 0.1 05/04/2022 1143    Chest Imaging: CXR 05/29/22 with low lung volumes, coarse interstitial markings  CT AP 06/20/21 with bilateral lower lobe predominant fibrosis, reticular opacities, no honeycombing  HRCT with widespread reticular opacities, subpleural fibrosis with traction bronchiolectasis without craniocaudal gradient, mild air  trapping  Pulmonary Functions Testing Results:    Latest Ref Rng & Units 08/01/2015    2:22 PM  PFT Results  FVC-Pre L 5.42  P  FVC-Predicted Pre % 113  P  FVC-Post L 5.35  P  FVC-Predicted Post % 112  P  Pre FEV1/FVC % % 85  P  Post FEV1/FCV % % 87  P  FEV1-Pre L 4.58  P  FEV1-Predicted Pre % 129  P  FEV1-Post L 4.65  P  DLCO uncorrected ml/min/mmHg 20.37  P  DLCO UNC% % 60  P  DLVA Predicted % 57  P  TLC L 7.55  P  TLC % Predicted % 104  P  RV % Predicted % 82  P    P Preliminary result   PFT 2017 with mildly reduced diffusing capacity    Echocardiogram:    1. Left ventricular ejection fraction, by estimation, is 55 to 60%. The  left ventricle has normal function. The left ventricle has no regional  wall motion abnormalities. There is moderate left ventricular hypertrophy.  Left ventricular diastolic  parameters are consistent with Grade I diastolic dysfunction (impaired  relaxation).   2. Right ventricular systolic function is normal. The right ventricular  size is normal.   3. The mitral valve is normal in structure. Trivial mitral valve  regurgitation. No evidence of mitral stenosis.   4. The aortic valve is tricuspid. Aortic valve regurgitation is mild. No  aortic stenosis is present.       Assessment & Plan:   # Chronic cough Components of likely PND, GERD though worry about progressive fibrotic lung disease as below  # ILD Unclear etiology. In past attributed to ARDS/ALI though doesn't look like he had ARDS or significant lung injury when he was hospitalized in 2016. Had about 6 months of amiodarone exposure in 2016 for AF. Nothing concerning by history for underlying CTD. Potentially related to severe reflux/microaspiration given lower lobe predominance.  # OSA not on CPAP   Plan: - labs today - complete ILD packet and bring to next visit - you'll be called to schedule CT Chest - would stay on flonase daily - take shower, clear nose of crusting then  1 spray each nostril daily till next visit for postnasal drainage - breathing tests next visit - let me know if you'd like to get sleep study - machines are quieter and masks/hoses are better than they used to be     Maryjane Hurter, MD [facility] 08/09/2022 6:35 PM

## 2022-08-12 ENCOUNTER — Ambulatory Visit: Payer: Medicare Other | Admitting: Student

## 2022-08-12 ENCOUNTER — Encounter: Payer: Medicare Other | Admitting: Student

## 2022-08-13 ENCOUNTER — Encounter: Payer: Self-pay | Admitting: Family Medicine

## 2022-08-13 DIAGNOSIS — K219 Gastro-esophageal reflux disease without esophagitis: Secondary | ICD-10-CM

## 2022-08-13 MED ORDER — OMEPRAZOLE 40 MG PO CPDR: 40.0000 mg | DELAYED_RELEASE_CAPSULE | Freq: Two times a day (BID) | ORAL | 0 refills | Status: DC

## 2022-08-13 NOTE — Telephone Encounter (Signed)
E-scribed refill to [facility].  

## 2022-08-19 NOTE — Progress Notes (Signed)
Synopsis: Referred for chronic cough, post covid issues by [name], MD  Subjective:   PATIENT ID: Alexander [name] GENDER: male DOB: 05-25-1951, MRN: [id number]  No chief complaint on file.  CC: dyspnea, cough  71yM with history of CAD, HTN, ICM, OSA not on CPAP, pAF, very light smoking in past. Had covid-19 a year ago in January was not hospitalized with it.   Coughing, short of breath, little energy over last 4 months. Cough is dry occasionally productive of sputum but he just swallows it. He does have sinus congestion and postnasal drainage. He tried flonase on and off since November. Benadryl here and there. He is on omeprazole BID for reflux. No solid/liquid food dysphagia.   Got steroid course in October/Nov, steroids did not help. Had another round of steroids/antibiotic which did not help.   Had polio as a kid and started to need braces/crutches in 2002.   Some morning stiffness but takes 10-15 min to loosen up, no rashes, raynaud's phenomenon, ulcers in mouth or nose.   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 Some exposure to cleaning agents in past, some solvents. No MJ, vaping. Born in [place], lived for [place] in [place] and then was in [place] in [place], moved to [place]. 3 dogs. No hot tub.   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 - +dry eyes/dry mouth, +joint stiffness/pain/swelling but insignificant morning stiffness as above - +heartburn on ppi - 2 joints/day MJ use [phone] - some mildew in shower when lived with friends in past - frequently does yardwork/gardening with damp soil, rotten wood/wood chips - has worked on damp air conditioned spaces, in warehouses - amiodarone for 6 months in 2016   Interval HPI: CK 464, otherwise CTD serologies neg  Was to fill out  ILD packet  HRCT with widespread reticular opacities, subpleural fibrosis with traction bronchiolectasis without craniocaudal gradient, mild air trapping  PFTs with moderate restriction, mildly reduced diffusing capacity  Started on flonase last visit in case PND contributing to UACS/chronic cough - hasn't helped with his cough.     Otherwise pertinent review of systems is negative.   Past Medical History:  Diagnosis Date   Acute cholecystitis 03/2016   with sepsis   Arthritis    CAD S/P percutaneous coronary angioplasty 1995, 2000, 2005   a. 1995 s/p BMS;  b. 2000 s/p BMS;  c. 2005 s/p stent - All stents in [facility] (RCA, LAD & OM - unknown on which date);  d. 04/2015 NSTEMI/Cath: LM nl, ost/p LAD 20%, patent mLAD stent, RI small, OM2 patent stent, pRCA 20%, patent stent, EF 45-50%-->Med Rx.   Calculus of bile duct with acute cholangitis with obstruction    Chronic pain syndrome    a. Followed @ [facility];  b. Uses 12-14 excedrins per day.   Complication of anesthesia 04/01/2021   agiated- "battling nurses"   Duodenal stricture    Essential hypertension    Fatty liver    Gallstones    GERD (gastroesophageal reflux disease)    History of depression    History of post poliomyelitis muscular atrophy    HLD (hyperlipidemia)    Ischemic cardiomyopathy    a. 04/2015 Echo: EF 40-45%, mod antsept, ant, antlat, apical HK, mild AI/MR, PASP 57mmHg.  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 04/2015 in setting of cholecystits and sepsis -->Amio;  b. CHA2DS2VASc = 4--> Eliquis.   Pneumonia 04/2015   Post-polio syndrome    a. ambulates with braces.   Septic shock ([place]) 01/07/2018   Shoulder pain, right      Family History  Problem Relation Age of Onset   Diabetes Mother    Heart disease Mother    Cancer Sister 69       male  cancer   Breast cancer Brother 8   Hypertension Maternal Aunt    Cancer Maternal Uncle        spinal   Coronary artery disease Neg Hx    Stroke Neg Hx    Colon cancer Neg Hx    Pancreatic cancer Neg Hx    Stomach cancer Neg Hx    Esophageal cancer Neg Hx    Inflammatory bowel disease Neg Hx    Liver disease Neg Hx    Rectal cancer Neg Hx      Past Surgical History:  Procedure Laterality Date   BALLOON DILATION N/A 06/06/2018   Procedure: BALLOON DILATION;  Surgeon: Mansouraty, Telford Nab., MD;  Location: [facility] ENDOSCOPY;  Service: Gastroenterology;  Laterality: N/A;   BALLOON DILATION N/A 07/06/2018   Procedure: BALLOON DILATION;  Surgeon: Rush Landmark Telford Nab., MD;  Location: [place];  Service: Gastroenterology;  Laterality: N/A;   BALLOON DILATION N/A 07/24/2021   Procedure: BALLOON DILATION;  Surgeon: Rush Landmark Telford Nab., MD;  Location: [place];  Service: Gastroenterology;  Laterality: N/A;   BILIARY BRUSHING  06/05/2021   Procedure: BILIARY BRUSHING;  Surgeon: Rush Landmark Telford Nab., MD;  Location: [place];  Service: Gastroenterology;;   BILIARY DILATION  06/05/2021   Procedure: BILIARY DILATION;  Surgeon: Irving Copas., MD;  Location: [place];  Service: Gastroenterology;;   BILIARY DILATION  09/15/2021   Procedure: BILIARY DILATION;  Surgeon: Irving Copas., MD;  Location: [facility];  Service: Gastroenterology;;   BILIARY STENT PLACEMENT  01/08/2018   Procedure: BILIARY STENT PLACEMENT;  Surgeon: Jackquline Denmark, MD;  Location: [place];  Service: Endoscopy;;   BILIARY STENT PLACEMENT N/A 02/09/2018   Procedure: BILIARY STENT PLACEMENT;  Surgeon: Irving Copas., MD;  Location: [facility] ENDOSCOPY;  Service: Gastroenterology;  Laterality: N/A;   BILIARY STENT PLACEMENT  04/01/2021   Procedure: BILIARY STENT PLACEMENT;  Surgeon: Jackquline Denmark, MD;  Location: [facility] ENDOSCOPY;  Service: Endoscopy;;   BIOPSY  06/06/2018   Procedure:  BIOPSY;  Surgeon: Irving Copas., MD;  Location: [facility] ENDOSCOPY;  Service: Gastroenterology;;   BIOPSY  04/01/2021   Procedure: BIOPSY;  Surgeon: Jackquline Denmark, MD;  Location: [facility] ENDOSCOPY;  Service: Endoscopy;;   BIOPSY  06/05/2021   Procedure: BIOPSY;  Surgeon: Irving Copas., MD;  Location: [facility] ENDOSCOPY;  Service: Gastroenterology;;   CARDIAC CATHETERIZATION N/A 04/30/2015   Procedure: Left Heart Cath and Coronary Angiography;  Surgeon: Troy Sine, MD;  Location: [place] CV LAB;  Service: Cardiovascular; LM nl, ost/p LAD 20%, patent mLAD stent, RI small, OM2 patent stent, pRCA 20%, patent stent, EF 45-50%-->Med Rx    cervical spine stimulator  08/2012   CHOLECYSTECTOMY  06/10/2015   Procedure: LAPAROSCOPIC CHOLECYSTECTOMY;  Surgeon: Mickeal Skinner, MD;  Location: [place];  Service: General;;  COLONOSCOPY  09/2013   TA x2, rpt 5 yrs ([place])   COLONOSCOPY WITH ESOPHAGOGASTRODUODENOSCOPY (EGD)  06/2018   reactive gastropathy, tubular adenoma, rpt colonoscopy 5 yrs (Mansouraty)   COLONOSCOPY WITH PROPOFOL N/A 06/06/2018   Procedure: COLONOSCOPY WITH PROPOFOL;  Surgeon: Rush Landmark Telford Nab., MD;  Location: [place];  Service: Gastroenterology;  Laterality: N/A;   CORONARY ANGIOPLASTY WITH STENT PLACEMENT  1995, 2000, 2005   stents in RCA, LAD & Cx-OM. -Stents were patent by cardiac catheterization in November 2016   ENDOSCOPIC RETROGRADE CHOLANGIOPANCREATOGRAPHY (ERCP) WITH PROPOFOL N/A 02/09/2018   Procedure: ENDOSCOPIC RETROGRADE CHOLANGIOPANCREATOGRAPHY (ERCP) WITH PROPOFOL;  Surgeon: Irving Copas., MD;  Location: [facility];  Service: Gastroenterology;  Laterality: N/A;   ENDOSCOPIC RETROGRADE CHOLANGIOPANCREATOGRAPHY (ERCP) WITH PROPOFOL N/A 03/30/2018   Procedure: ENDOSCOPIC RETROGRADE CHOLANGIOPANCREATOGRAPHY (ERCP) WITH PROPOFOL;  Surgeon: Rush Landmark Telford Nab., MD;  Location: [facility] ENDOSCOPY;  Service: Gastroenterology;  Laterality: N/A;    ENDOSCOPIC RETROGRADE CHOLANGIOPANCREATOGRAPHY (ERCP) WITH PROPOFOL N/A 06/05/2021   Procedure: ENDOSCOPIC RETROGRADE CHOLANGIOPANCREATOGRAPHY (ERCP) WITH PROPOFOL;  Surgeon: Rush Landmark Telford Nab., MD;  Location: [place];  Service: Gastroenterology;  Laterality: N/A;   ENDOSCOPIC RETROGRADE CHOLANGIOPANCREATOGRAPHY (ERCP) WITH PROPOFOL N/A 09/15/2021   Procedure: ENDOSCOPIC RETROGRADE CHOLANGIOPANCREATOGRAPHY (ERCP) WITH PROPOFOL;  Surgeon: Rush Landmark Telford Nab., MD;  Location: [facility] ENDOSCOPY;  Service: Gastroenterology;  Laterality: N/A;   ERCP N/A 01/08/2018   Procedure: ENDOSCOPIC RETROGRADE CHOLANGIOPANCREATOGRAPHY (ERCP);  Surgeon: Jackquline Denmark, MD;  Location: [facility] ENDOSCOPY;  Service: Endoscopy;  Laterality: N/A;   ERCP N/A 04/01/2021   Procedure: ENDOSCOPIC RETROGRADE CHOLANGIOPANCREATOGRAPHY (ERCP);  Surgeon: Jackquline Denmark, MD;  Location: [facility] ENDOSCOPY;  Service: Endoscopy;  Laterality: N/A;  Please schedule after 1 PM   ESOPHAGOGASTRODUODENOSCOPY N/A 03/30/2018   Procedure: ESOPHAGOGASTRODUODENOSCOPY (EGD);  Surgeon: Irving Copas., MD;  Location: [facility];  Service: Gastroenterology;  Laterality: N/A;   ESOPHAGOGASTRODUODENOSCOPY (EGD) WITH PROPOFOL N/A 06/06/2018   Procedure: ESOPHAGOGASTRODUODENOSCOPY (EGD) WITH PROPOFOL;  Surgeon: Rush Landmark Telford Nab., MD;  Location: [place];  Service: Gastroenterology;  Laterality: N/A;   ESOPHAGOGASTRODUODENOSCOPY (EGD) WITH PROPOFOL N/A 07/06/2018   Procedure: ESOPHAGOGASTRODUODENOSCOPY (EGD) WITH PROPOFOL;  Surgeon: Rush Landmark Telford Nab., MD;  Location: [place];  Service: Gastroenterology;  Laterality: N/A;   ESOPHAGOGASTRODUODENOSCOPY (EGD) WITH PROPOFOL N/A 07/24/2021   Procedure: ESOPHAGOGASTRODUODENOSCOPY (EGD) WITH PROPOFOL WITH FLUOROSCOPY;  Surgeon: Rush Landmark Telford Nab., MD;  Location: [place];  Service: Gastroenterology;  Laterality: N/A;   EUS N/A 07/24/2021   Procedure: UPPER ENDOSCOPIC ULTRASOUND  (EUS) RADIAL;  Surgeon: Irving Copas., MD;  Location: [place];  Service: Gastroenterology;  Laterality: N/A;   lumbar spine stimulator     POLYPECTOMY  06/06/2018   Procedure: POLYPECTOMY;  Surgeon: Mansouraty, Telford Nab., MD;  Location: [place];  Service: Gastroenterology;;   REMOVAL OF STONES  03/30/2018   Procedure: REMOVAL OF STONES;  Surgeon: Irving Copas., MD;  Location: [facility];  Service: Gastroenterology;;   REMOVAL OF STONES  06/05/2021   Procedure: REMOVAL OF STONES;  Surgeon: Irving Copas., MD;  Location: [place];  Service: Gastroenterology;;   REMOVAL OF STONES  09/15/2021   Procedure: REMOVAL OF STONES;  Surgeon: Irving Copas., MD;  Location: [facility];  Service: Gastroenterology;;   Joan Mayans  01/08/2018   Procedure: Joan Mayans;  Surgeon: Jackquline Denmark, MD;  Location: [place];  Service: Endoscopy;;   SPHINCTEROTOMY  02/09/2018   Procedure: Joan Mayans;  Surgeon: Mansouraty, Telford Nab., MD;  Location[facility];  Service: Gastroenterology;;   [facility] CHOLANGIOSCOPY N/A 06/05/2021   Procedure: SPYGLASS CHOLANGIOSCOPY;  Surgeon: Irving Copas., MD;  Location: [place];  Service: Gastroenterology;  Laterality: N/A;   STENT REMOVAL  02/09/2018   Procedure: STENT REMOVAL;  Surgeon: Irving Copas., MD;  Location: [facility];  Service: Gastroenterology;;   Lavell Islam REMOVAL  03/30/2018   Procedure: STENT REMOVAL;  Surgeon: Irving Copas., MD;  Location: [facility];  Service: Gastroenterology;;   Lavell Islam REMOVAL  06/05/2021   Procedure: STENT REMOVAL;  Surgeon: Irving Copas., MD;  Location: [place];  Service: Gastroenterology;;   TRANSTHORACIC ECHOCARDIOGRAM  04/2015   EF 40-45%, mod antsept, ant, antlat, apical HK, mild AI/MR, PASP 108mmHg.   TRANSTHORACIC ECHOCARDIOGRAM  08/23/2020   EF 55 to 60%.  No RWMA.  Mild LVH.  GR 1 DD.  Normal RV size and function  with normal RVP.  Normal mitral and aortic valve.   UVULOPALATOPHARYNGOPLASTY, TONSILLECTOMY AND SEPTOPLASTY  2002    Social History   Socioeconomic History   Marital status: Married    Spouse name: Robyn   Number of children: 3   Years of education: Not on file   Highest education level: Not on file  Occupational History   Occupation: retired  Tobacco Use   Smoking status: Former    Types: Cigarettes    Quit date: 1972    Years since quitting: 52.2   Smokeless tobacco: Never   Tobacco comments:    was very light  Vaping Use   Vaping Use: Never used  Substance and Sexual Activity   Alcohol use: Not Currently    Comment: ocassional -maybe 2 beers   Drug use: No   Sexual activity: Yes  Other Topics Concern   Not on file  Social History Narrative   Caffeine: 1 cup soda, occasional coffee   Lives with wife [name]) and 66 yo son, 4 dogs   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   Activity: no regular activity   Diet: overall healthy, good fruits and vegetables, good amt water   Social Determinants of Health   Financial Resource Strain: Low Risk  (10/21/2021)   Overall Financial Resource Strain (CARDIA)    Difficulty of Paying Living Expenses: Not hard at all  Food Insecurity: No Food Insecurity (10/21/2021)   Hunger Vital Sign    Worried About Running Out of Food in the Last Year: Never true    [place] in the Last Year: Never true  Transportation Needs: No Transportation Needs (10/21/2021)   PRAPARE - Hydrologist (Medical): No    Lack of Transportation (Non-Medical): No  Physical Activity: Insufficiently Active (10/21/2021)   Exercise Vital Sign    Days of Exercise per Week: 4 days    Minutes of Exercise per Session: 30 min  Stress: No Stress Concern Present (10/21/2021)   [place]    Feeling of Stress : Not at all   Social Connections: Moderately Integrated (10/21/2021)   Social Connection and Isolation Panel [NHANES]    Frequency of Communication with Friends and Family: More than three times a week    Frequency of Social Gatherings with Friends and Family: Twice a week    Attends Religious Services: Never    Marine scientist or Organizations: Yes    Attends Archivist Meetings: More than 4 times per year    Marital Status: Married  Human resources officer Violence: Not At Risk (10/21/2021)  Humiliation, Afraid, Rape, and Kick questionnaire    Fear of Current or Ex-Partner: No    Emotionally Abused: No    Physically Abused: No    Sexually Abused: No     No Known Allergies   Outpatient Medications Prior to Visit  Medication Sig Dispense Refill   acetaminophen (TYLENOL) 500 MG tablet Take 1,000 mg by mouth every 6 (six) hours as needed for moderate pain or headache.      Ascorbic Acid (VITAMIN C) 1000 MG tablet Take 1,000 mg by mouth daily.     aspirin EC 81 MG tablet Take 81 mg by mouth daily. Swallow whole.     Carboxymethylcellulose Sodium (LUBRICANT EYE DROPS OP) Place 2 drops into both eyes daily as needed (for dry eyes).     Cholecalciferol (VITAMIN D3) 50 MCG (2000 UT) TABS Take 2,000 Units by mouth daily.     gabapentin (NEURONTIN) 600 MG tablet TAKE 2 TABLETS BY MOUTH 3 TIMES  DAILY 540 tablet 2   ibuprofen (ADVIL) 200 MG tablet Take 400 mg by mouth every 6 (six) hours as needed for headache or moderate pain.     Magnesium 250 MG TABS Take 1 tablet (250 mg total) by mouth at bedtime. (Patient taking differently: Take 250 mg by mouth at bedtime.)  0   mirabegron ER (MYRBETRIQ) 25 MG TB24 tablet TAKE 1 TABLET (25 MG TOTAL) BY MOUTH DAILY. 90 tablet 1   Omega-3 Fatty Acids (FISH OIL) 1000 MG CAPS Take 1,000 mg by mouth daily.      omeprazole (PRILOSEC) 40 MG capsule Take 1 capsule (40 mg total) by mouth 2 (two) times daily before a meal. 180 capsule 0   rosuvastatin (CRESTOR) 40 MG  tablet TAKE 1 TABLET BY MOUTH DAILY 90 tablet 1   sildenafil (VIAGRA) 100 MG tablet TAKE 1/2 TO 1 TABLET BY MOUTH DAILY AS NEEDED FOR ERECTILE DYSFUNCTION (Patient taking differently: Take 50-100 mg by mouth daily as needed for erectile dysfunction.) 6 tablet 0   tamsulosin (FLOMAX) 0.4 MG CAPS capsule TAKE 1 CAPSULE BY MOUTH EVERY DAY 90 capsule 3   vitamin B-12 (CYANOCOBALAMIN) 1000 MCG tablet Take 1,000 mcg by mouth daily.     vitamin E 400 UNIT capsule Take 400 Units by mouth daily.     doxycycline (VIBRA-TABS) 100 MG tablet Take 1 tablet (100 mg total) by mouth 2 (two) times daily. (Patient not taking: Reported on 07/17/2022) 14 tablet 0   Multiple Vitamin (MULTIVITAMIN WITH MINERALS) TABS tablet Take 1 tablet by mouth daily. (Patient not taking: Reported on 08/21/2022)     predniSONE (DELTASONE) 20 MG tablet Take 2 tablets (40 mg total) by mouth daily. For 3 days, then 1 tab daily for 3 days (Patient not taking: Reported on 07/17/2022) 9 tablet 0   No facility-administered medications prior to visit.       Objective:   Physical Exam:  General appearance: 72 y.o., male, NAD, conversant  Eyes: anicteric sclerae; PERRL, tracking appropriately HENT: NCAT; MMM Neck: Trachea midline; no lymphadenopathy, no JVD Lungs: CTAB, no crackles, no wheeze, with normal respiratory effort CV: RRR, no murmur  Abdomen: Soft, non-tender; non-distended, BS present  Extremities: No peripheral edema, warm Skin: Normal turgor and texture; no rash Psych: Appropriate affect Neuro: Alert and oriented to person and place, no focal deficit     Vitals:   08/21/22 1302  BP: 108/72  Pulse: 98  Temp: (!) 97.5 F (36.4 C)  TempSrc: Oral  SpO2: 92%  Weight:  217 lb 9.6 oz (98.7 kg)  Height: 5\' 10"  (1.778 m)    92% on RA BMI Readings from Last 3 Encounters:  08/21/22 31.22 kg/m  07/17/22 32.08 kg/m  05/29/22 30.96 kg/m   Wt Readings from Last 3 Encounters:  08/21/22 217 lb 9.6 oz (98.7 kg)   07/17/22 223 lb 9.6 oz (101.4 kg)  05/29/22 222 lb (100.7 kg)     CBC    Component Value Date/Time   WBC 7.1 05/04/2022 1143   RBC 4.51 05/04/2022 1143   HGB 14.8 05/04/2022 1143   HCT 42.9 05/04/2022 1143   PLT 223.0 05/04/2022 1143   MCV 95.0 05/04/2022 1143   MCH 32.2 04/03/2021 0037   MCHC 34.4 05/04/2022 1143   RDW 13.5 05/04/2022 1143   LYMPHSABS 2.6 05/04/2022 1143   MONOABS 0.5 05/04/2022 1143   EOSABS 0.4 05/04/2022 1143   BASOSABS 0.1 05/04/2022 1143    Chest Imaging: CXR 05/29/22 with low lung volumes, coarse interstitial markings  CT AP 06/20/21 with bilateral lower lobe predominant fibrosis, reticular opacities, no honeycombing  HRCT with widespread reticular opacities, subpleural fibrosis with traction bronchiolectasis without craniocaudal gradient, mild air trapping  Pulmonary Functions Testing Results:    Latest Ref Rng & Units 08/21/2022   10:42 AM 08/01/2015    2:22 PM  PFT Results  FVC-Pre L 2.42  P 5.42  P  FVC-Predicted Pre % 55  P 113  P  FVC-Post L 2.12  P 5.35  P  FVC-Predicted Post % 48  P 112  P  Pre FEV1/FVC % % 84  P 85  P  Post FEV1/FCV % % 90  P 87  P  FEV1-Pre L 2.04  P 4.58  P  FEV1-Predicted Pre % 64  P 129  P  FEV1-Post L 1.92  P 4.65  P  DLCO uncorrected ml/min/mmHg 17.86  P 20.37  P  DLCO UNC% % 69  P 60  P  DLCO corrected ml/min/mmHg 17.86  P   DLCO COR %Predicted % 69  P   DLVA Predicted % 93  P 57  P  TLC L 4.08  P 7.55  P  TLC % Predicted % 58  P 104  P  RV % Predicted % 59  P 82  P    P Preliminary result   PFT 2017 with mildly reduced diffusing capacity    Echocardiogram:    1. Left ventricular ejection fraction, by estimation, is 55 to 60%. The  left ventricle has normal function. The left ventricle has no regional  wall motion abnormalities. There is moderate left ventricular hypertrophy.  Left ventricular diastolic  parameters are consistent with Grade I diastolic dysfunction (impaired  relaxation).   2.  Right ventricular systolic function is normal. The right ventricular  size is normal.   3. The mitral valve is normal in structure. Trivial mitral valve  regurgitation. No evidence of mitral stenosis.   4. The aortic valve is tricuspid. Aortic valve regurgitation is mild. No  aortic stenosis is present.       Assessment & Plan:   # Chronic cough Components of likely PND, GERD though worry about progressive fibrotic lung disease as below  # ILD Unclear etiology. HRCT alternative diagnosis - not UIP. Possible chronic HP given long term dust exposure/gardening. In past attributed to ARDS/ALI though doesn't look like he had ARDS or significant lung injury when he was hospitalized in 2016. Had about 6 months of amiodarone exposure in 2016  for AF. Nothing concerning by history for underlying CTD though CK is elevated raising possibility of myositis however may just be elevated from statin, polio, and/or dehydration. Potentially related to severe reflux/microaspiration given some lower lobe predominance.  # OSA not on CPAP   Plan: - aldolase, myomarker panel - home sleep study you'll be called to schedule - I'll see if we can discuss your case at next multidisciplinary ILD conference. We would be weighing starting prednisone and another immunosuppressing medication like cellcept vs instead potentially referring for surgical lung biopsy or pursuing bronchoscopy.    I spent 50 minutes dedicated to the care of this patient on the date of this encounter to include pre-visit review of records, face-to-face time with the patient discussing conditions above, post visit ordering of testing, clinical documentation with the electronic health record, making appropriate referrals as documented, and communicating necessary findings to members of the patients care team.    Maryjane Hurter, MD [facility] 08/21/2022 1:03 PM

## 2022-08-21 ENCOUNTER — Ambulatory Visit: Payer: Medicare Other | Admitting: Student

## 2022-08-21 ENCOUNTER — Encounter: Payer: Self-pay | Admitting: Student

## 2022-08-21 ENCOUNTER — Ambulatory Visit (INDEPENDENT_AMBULATORY_CARE_PROVIDER_SITE_OTHER): Payer: Medicare Other | Admitting: Student

## 2022-08-21 VITALS — BP 108/72 | HR 98 | Temp 97.5°F | Ht 70.0 in | Wt 217.6 lb

## 2022-08-21 DIAGNOSIS — R0609 Other forms of dyspnea: Secondary | ICD-10-CM

## 2022-08-21 DIAGNOSIS — J849 Interstitial pulmonary disease, unspecified: Secondary | ICD-10-CM

## 2022-08-21 DIAGNOSIS — G4719 Other hypersomnia: Secondary | ICD-10-CM | POA: Diagnosis not present

## 2022-08-21 LAB — PULMONARY FUNCTION TEST
DL/VA % pred: 93 %
DL/VA: 3.76 ml/min/mmHg/L
DLCO cor % pred: 69 %
DLCO cor: 17.86 ml/min/mmHg
DLCO unc % pred: 69 %
DLCO unc: 17.86 ml/min/mmHg
FEF 25-75 Post: 1.97 L/sec
FEF 25-75 Pre: 2.45 L/sec
FEF2575-%Change-Post: -19 %
FEF2575-%Pred-Post: 82 %
FEF2575-%Pred-Pre: 103 %
FEV1-%Change-Post: -6 %
FEV1-%Pred-Post: 60 %
FEV1-%Pred-Pre: 64 %
FEV1-Post: 1.92 L
FEV1-Pre: 2.04 L
FEV1FVC-%Change-Post: 7 %
FEV1FVC-%Pred-Pre: 115 %
FEV6-%Change-Post: -13 %
FEV6-%Pred-Post: 51 %
FEV6-%Pred-Pre: 59 %
FEV6-Post: 2.1 L
FEV6-Pre: 2.42 L
FEV6FVC-%Pred-Post: 106 %
FEV6FVC-%Pred-Pre: 106 %
FVC-%Change-Post: -12 %
FVC-%Pred-Post: 48 %
FVC-%Pred-Pre: 55 %
FVC-Post: 2.12 L
FVC-Pre: 2.42 L
Post FEV1/FVC ratio: 90 %
Post FEV6/FVC ratio: 100 %
Pre FEV1/FVC ratio: 84 %
Pre FEV6/FVC Ratio: 100 %
RV % pred: 59 %
RV: 1.48 L
TLC % pred: 58 %
TLC: 4.08 L

## 2022-08-21 NOTE — Patient Instructions (Signed)
Full PFT performed today. °

## 2022-08-21 NOTE — Progress Notes (Signed)
Full PFT performed today. °

## 2022-08-21 NOTE — Patient Instructions (Signed)
-   labs today to look for myositis related ILD - bring copy of ILD packet to clinic as soon as you can - home sleep study you'll be called to schedule - I'll see if we can discuss your case at next multidisciplinary ILD conference. We would be weighing starting prednisone and another immunosuppressing medication like cellcept vs instead potentially referring for surgical lung biopsy or pursuing bronchoscopy.

## 2022-08-25 ENCOUNTER — Telehealth: Payer: Self-pay | Admitting: Student

## 2022-08-25 LAB — ALDOLASE: Aldolase: 4.7 U/L (ref 3.3–10.3)

## 2022-08-25 NOTE — Telephone Encounter (Signed)
Liz Malady; Jobie Quaker, [place]; [place], [place]; Mariann Barter Response from qualifications team below.  "on the Stats they left off the Liter flow on the sats while ambulating that is all I need corrected"   Thanks!

## 2022-08-26 NOTE — Telephone Encounter (Signed)
Information has been updated

## 2022-08-27 ENCOUNTER — Telehealth: Payer: Self-pay | Admitting: Student

## 2022-08-28 NOTE — Telephone Encounter (Signed)
Called and spoke with patients wife. Told patients wife I tried to call her the same day of the visit shortly after they had left to let her know about the oxygen order. She stated that she thought the calls about the oxygen was a scam and just wanted to clarify.  She verbalized understanding.   Nothing further needed.

## 2022-09-02 ENCOUNTER — Ambulatory Visit: Payer: Medicare Other

## 2022-09-02 DIAGNOSIS — G4719 Other hypersomnia: Secondary | ICD-10-CM

## 2022-09-02 DIAGNOSIS — G4733 Obstructive sleep apnea (adult) (pediatric): Secondary | ICD-10-CM | POA: Diagnosis not present

## 2022-09-03 ENCOUNTER — Telehealth: Payer: Self-pay | Admitting: Student

## 2022-09-03 NOTE — Telephone Encounter (Signed)
PT wife calling stating that [employer] sent a form over to [place] for signature by Dr. on April 2nd. It just needs a signature so they can get his POC. Please call PT to advise once we take care of this please. Grenelefe [name]

## 2022-09-04 ENCOUNTER — Other Ambulatory Visit: Payer: Self-pay | Admitting: Family Medicine

## 2022-09-04 DIAGNOSIS — E785 Hyperlipidemia, unspecified: Secondary | ICD-10-CM

## 2022-09-04 NOTE — Telephone Encounter (Signed)
Everything from [employer] [place] signed given to [facility]  Johny Drilling, was this one the he signed?  I spoke with spouse, [name] and let her know I am checking on this.

## 2022-09-04 NOTE — Telephone Encounter (Signed)
Yes and all of the cmn's have been faxed back and received confirmation

## 2022-09-04 NOTE — Telephone Encounter (Signed)
It was send today at 3:32

## 2022-09-04 NOTE — Telephone Encounter (Signed)
Spoke with the pt's spouse and notified her that the form was faxed to [employer] today  She verbalized understanding  Nothing further needed

## 2022-09-06 LAB — MYOMARKER 3 PLUS PROFILE (RDL)

## 2022-09-07 NOTE — Telephone Encounter (Signed)
Please call patient and schedule CPE after 10/29/22. Patient should be scheduled for AMW also prior to appointment with Dr. Sharen Hones

## 2022-09-08 ENCOUNTER — Ambulatory Visit: Payer: Self-pay | Admitting: Internal Medicine

## 2022-09-11 DIAGNOSIS — G4733 Obstructive sleep apnea (adult) (pediatric): Secondary | ICD-10-CM | POA: Diagnosis not present

## 2022-09-11 NOTE — Progress Notes (Signed)
Interstitial Lung Disease Multidisciplinary Conference   Alexander [name]    MRN [id number]    DOB 1951-03-07  Primary Care Physician:Gutierrez, Wynona Canes, MD  Referring Physician: Dr. Thora Lance  Time of Conference: 7.00am- 8.00am Date of conference: 09/08/2022 Location of Conference: -  Virtual  Participating Pulmonary: Dr. Kalman Shan, Dr Chilton Greathouse, Dr. Felisa Bonier Pathology: Dr Holley Bouche Radiology: Dr Lauralyn Primes  Others:   Brief History: 72 year old was on amio for 6 months. Has post polio syndrome. Has severe GERD . [place]. When lived in [place] it was damp. Serology negative. 5-6 months of symptoms. HAd mild covid a  year ago. Does this seem to be consistent with chronic hp from dust exposure enough to treat empirically?   Should I alternatively send for surgical lung biopsy or instead consider bronchoscopy/BAL for cell count/diff first?    PFT    Latest Ref Rng & Units 08/21/2022   10:42 AM 08/01/2015    2:22 PM  PFT Results  FVC-Pre L 2.42  5.42  P  FVC-Predicted Pre % 55  113  P  FVC-Post L 2.12  5.35  P  FVC-Predicted Post % 48  112  P  Pre FEV1/FVC % % 84  85  P  Post FEV1/FCV % % 90  87  P  FEV1-Pre L 2.04  4.58  P  FEV1-Predicted Pre % 64  129  P  FEV1-Post L 1.92  4.65  P  DLCO uncorrected ml/min/mmHg 17.86  20.37  P  DLCO UNC% % 69  60  P  DLCO corrected ml/min/mmHg 17.86    DLCO COR %Predicted % 69    DLVA Predicted % 93  57  P  TLC L 4.08  7.55  P  TLC % Predicted % 58  104  P  RV % Predicted % 59  82  P    P Preliminary result      MDD discussion of CT scan    - Date or time period of scan: HRCT: 07/24/2022   - Discussion synopsis:  clearest case of alternative diagnosis. Has quite a bit of traction bronchiectasis.  There is lot of air trapping . Upper lobe predminant.   - What is the final conclusion per 2018 ATS/Fleischner Criteria - Leading differential is Chronic HP  -  Concordance with official report: CONCORDANT  Pathology discussion of biopsy: No lung biopsy    MDD Impression/Recs: Suspect chronic HP ., REC a) send HP panel; b) ILD questionnaire; c) shared decision making for bronch BAL/Ttbx for histopath and envisia   Time Spent in preparation and discussion:  > 30 min    SIGNATURE   Dr. Kalman Shan, M.D., F.C.C.P,  Pulmonary and Critical Care Medicine Staff Physician, [facility] Center Director - Interstitial Lung Disease  Program  Pulmonary Fibrosis [facility] [place], [place], [address]  Pager: [phone], If no answer or between  15:00h - 7:00h: call [phone] Telephone: [phone]  2:17 PM 09/11/2022 ...................................................................................................................Marland Kitchen References: Diagnosis of Hypersensitivity Pneumonitis in Adults. An Official ATS/JRS/ALAT Clinical Practice Guideline. Ragu G et al, Am J Respir Crit Care Med. 2020 Aug 1;202(3):e36-e69.       Diagnosis of Idiopathic Pulmonary Fibrosis. An Official ATS/ERS/JRS/ALAT Clinical Practice Guideline. Raghu G et al, Am J Respir Crit Care Med. 2018 Sep 1;198(5):e44-e68.   IPF Suspected   Histopath ology Pattern  UIP  Probable UIP  Indeterminate for  UIP  Alternative  diagnosis    UIP  IPF  IPF  IPF  Non-IPF dx   HRCT   Probabe UIP  IPF  IPF  IPF (Likely)**  Non-IPF dx  Pattern  Indeterminate for UIP  IPF  IPF (Likely)**  Indeterminate  for IPF**  Non-IPF dx    Alternative diagnosis  IPF (Likely)**/ non-IPF dx  Non-IPF dx  Non-IPF dx  Non-IPF dx     Idiopathic pulmonary fibrosis diagnosis based upon HRCT and Biopsy paterns.  ** IPF is the likely diagnosis when any of following features are present:  Moderate-to-severe traction bronchiectasis/bronchiolectasis (defined as mild traction bronchiectasis/bronchiolectasis in four or more  lobes including the lingual as a lobe, or moderate to severe traction bronchiectasis in two or more lobes) in a man over age 32 years or in a woman over age 45 years Extensive (>30%) reticulation on HRCT and an age >70 years  Increased neutrophils and/or absence of lymphocytosis in BAL fluid  Multidisciplinary discussion reaches a confident diagnosis of IPF.   **Indeterminate for IPF  Without an adequate biopsy is unlikely to be IPF  With an adequate biopsy may be reclassified to a more specific diagnosis after multidisciplinary discussion and/or additional consultation.   dx = diagnosis; HRCT = high-resolution computed tomography; IPF = idiopathic pulmonary fibrosis; UIP = usual interstitial pneumonia.

## 2022-09-30 HISTORY — PX: BRONCHOSCOPY: SUR163

## 2022-10-11 ENCOUNTER — Other Ambulatory Visit: Payer: Self-pay | Admitting: Family Medicine

## 2022-10-12 NOTE — Telephone Encounter (Signed)
Refill request Gabapentin Last refill 03/11/22 #540/2 Last office visit 05/29/22 acute Upcoming appointment 11/11/22

## 2022-10-19 NOTE — Progress Notes (Unsigned)
Synopsis: Referred for chronic cough, post covid issues by Eustaquio Boyden, MD  Subjective:   PATIENT ID: Caprice Kluver GENDER: male DOB: September 17, 1950, MRN: [id number]  No chief complaint on file.  CC: dyspnea, cough  72yM with history of CAD, HTN, ICM, OSA not on CPAP, pAF, very light smoking in past. Had covid-19 a year ago in January was not hospitalized with it.   Coughing, short of breath, little energy over last 4 months. Cough is dry occasionally productive of sputum but he just swallows it. He does have sinus congestion and postnasal drainage. He tried flonase on and off since November. Benadryl here and there. He is on omeprazole BID for reflux. No solid/liquid food dysphagia.   Got steroid course in October/Nov, steroids did not help. Had another round of steroids/antibiotic which did not help.   Had polio as a kid and started to need braces/crutches in 2002.   Some morning stiffness but takes 10-15 min to loosen up, no rashes, raynaud's phenomenon, ulcers in mouth or nose.   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 Some exposure to cleaning agents in past, some solvents. No MJ, vaping. Born in [place], lived for 40y in [place] and then was in [place] in [place], moved to [place]. 3 dogs. No hot tub.   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 - +dry eyes/dry mouth, +joint stiffness/pain/swelling but insignificant morning stiffness as above - +heartburn on ppi - 2 joints/day MJ use [phone] - some mildew in shower when lived with friends in past - frequently does yardwork/gardening with damp soil, rotten wood/wood chips - has worked on damp air conditioned spaces, in warehouses - amiodarone for 6 months in 2016   Interval HPI: CK 464, otherwise CTD serologies neg  Was to fill out  ILD packet  HRCT with widespread reticular opacities, subpleural fibrosis with traction bronchiolectasis without craniocaudal gradient, mild air trapping  PFTs with moderate restriction, mildly reduced diffusing capacity  Started on flonase last visit in case PND contributing to UACS/chronic cough - hasn't helped with his cough.   ----------------------------------- ILD conference recommends shared decision-making discussion wrt bronch/BAL/TBLB  PSG with moderate OSA AHI of 20 and SpO2 low of 75%  Otherwise pertinent review of systems is negative.   Past Medical History:  Diagnosis Date   Acute cholecystitis 03/2016   with sepsis   Arthritis    CAD S/P percutaneous coronary angioplasty 1995, 2000, 2005   a. 1995 s/p BMS;  b. 2000 s/p BMS;  c. 2005 s/p stent - All stents in [facility] (RCA, LAD & OM - unknown on which date);  d. 04/2015 NSTEMI/Cath: LM nl, ost/p LAD 20%, patent mLAD stent, RI small, OM2 patent stent, pRCA 20%, patent stent, EF 45-50%-->Med Rx.   Calculus of bile duct with acute cholangitis with obstruction    Chronic pain syndrome    a. Followed @ [facility];  b. Uses 12-14 excedrins per day.   Complication of anesthesia 04/01/2021   agiated- "battling nurses"   Duodenal stricture    Essential hypertension    Fatty liver    Gallstones    GERD (gastroesophageal reflux disease)    History of depression    History of post poliomyelitis muscular atrophy    HLD (hyperlipidemia)  Ischemic cardiomyopathy    a. 04/2015 Echo: EF 40-45%, mod antsept, ant, antlat, apical HK, mild AI/MR, PASP .   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 04/2015 in setting of cholecystits and sepsis -->Amio;  b. CHA2DS2VASc = 4--> Eliquis.   Pneumonia 04/2015   Post-polio syndrome    a. ambulates with braces.   Septic shock  (HCC) 01/07/2018   Shoulder pain, right      Family History  Problem Relation Age of Onset   Diabetes Mother    Heart disease Mother    Cancer Sister 76       male cancer   Breast cancer Brother 65   Hypertension Maternal Aunt    Cancer Maternal Uncle        spinal   Coronary artery disease Neg Hx    Stroke Neg Hx    Colon cancer Neg Hx    Pancreatic cancer Neg Hx    Stomach cancer Neg Hx    Esophageal cancer Neg Hx    Inflammatory bowel disease Neg Hx    Liver disease Neg Hx    Rectal cancer Neg Hx      Past Surgical History:  Procedure Laterality Date   BALLOON DILATION N/A 06/06/2018   Procedure: BALLOON DILATION;  Surgeon: Mansouraty, Netty Starring., MD;  Location: [facility] ENDOSCOPY;  Service: Gastroenterology;  Laterality: N/A;   BALLOON DILATION N/A 07/06/2018   Procedure: BALLOON DILATION;  Surgeon: Meridee Score Netty Starring., MD;  Location: [facility] ENDOSCOPY;  Service: Gastroenterology;  Laterality: N/A;   BALLOON DILATION N/A 07/24/2021   Procedure: BALLOON DILATION;  Surgeon: Meridee Score Netty Starring., MD;  Location: [facility] ENDOSCOPY;  Service: Gastroenterology;  Laterality: N/A;   BILIARY BRUSHING  06/05/2021   Procedure: BILIARY BRUSHING;  Surgeon: Meridee Score Netty Starring., MD;  Location: [facility] ENDOSCOPY;  Service: Gastroenterology;;   BILIARY DILATION  06/05/2021   Procedure: BILIARY DILATION;  Surgeon: Lemar Lofty., MD;  Location: [facility] ENDOSCOPY;  Service: Gastroenterology;;   BILIARY DILATION  09/15/2021   Procedure: BILIARY DILATION;  Surgeon: Lemar Lofty., MD;  Location: [facility] ENDOSCOPY;  Service: Gastroenterology;;   BILIARY STENT PLACEMENT  01/08/2018   Procedure: BILIARY STENT PLACEMENT;  Surgeon: Lynann Bologna, MD;  Location: [facility] ENDOSCOPY;  Service: Endoscopy;;   BILIARY STENT PLACEMENT N/A 02/09/2018   Procedure: BILIARY STENT PLACEMENT;  Surgeon: Lemar Lofty., MD;  Location: [facility] ENDOSCOPY;  Service: Gastroenterology;  Laterality: N/A;   BILIARY  STENT PLACEMENT  04/01/2021   Procedure: BILIARY STENT PLACEMENT;  Surgeon: Lynann Bologna, MD;  Location: [facility] ENDOSCOPY;  Service: Endoscopy;;   BIOPSY  06/06/2018   Procedure: BIOPSY;  Surgeon: Lemar Lofty., MD;  Location: [facility] ENDOSCOPY;  Service: Gastroenterology;;   BIOPSY  04/01/2021   Procedure: BIOPSY;  Surgeon: Lynann Bologna, MD;  Location: [facility] ENDOSCOPY;  Service: Endoscopy;;   BIOPSY  06/05/2021   Procedure: BIOPSY;  Surgeon: Lemar Lofty., MD;  Location: [facility] ENDOSCOPY;  Service: Gastroenterology;;   CARDIAC CATHETERIZATION N/A 04/30/2015   Procedure: Left Heart Cath and Coronary Angiography;  Surgeon: Lennette Bihari, MD;  Location: [facility] INVASIVE CV LAB;  Service: Cardiovascular; LM nl, ost/p LAD 20%, patent mLAD stent, RI small, OM2 patent stent, pRCA 20%, patent stent, EF 45-50%-->Med Rx    cervical spine stimulator  08/2012  CHOLECYSTECTOMY  06/10/2015   Procedure: LAPAROSCOPIC CHOLECYSTECTOMY;  Surgeon: De Blanch Kinsinger, MD;  Location: [facility] OR;  Service: General;;   COLONOSCOPY  09/2013   TA x2, rpt 5 yrs (Pyrtle)   COLONOSCOPY WITH ESOPHAGOGASTRODUODENOSCOPY (EGD)  06/2018   reactive gastropathy, tubular adenoma, rpt colonoscopy 5 yrs (Mansouraty)   COLONOSCOPY WITH PROPOFOL N/A 06/06/2018   Procedure: COLONOSCOPY WITH PROPOFOL;  Surgeon: Lemar Lofty., MD;  Location: [facility] ENDOSCOPY;  Service: Gastroenterology;  Laterality: N/A;   CORONARY ANGIOPLASTY WITH STENT PLACEMENT  1995, 2000, 2005   stents in RCA, LAD & Cx-OM. -Stents were patent by cardiac catheterization in November 2016   ENDOSCOPIC RETROGRADE CHOLANGIOPANCREATOGRAPHY (ERCP) WITH PROPOFOL N/A 02/09/2018   Procedure: ENDOSCOPIC RETROGRADE CHOLANGIOPANCREATOGRAPHY (ERCP) WITH PROPOFOL;  Surgeon: Lemar Lofty., MD;  Location: [facility] ENDOSCOPY;  Service: Gastroenterology;  Laterality: N/A;   ENDOSCOPIC RETROGRADE CHOLANGIOPANCREATOGRAPHY (ERCP) WITH PROPOFOL N/A 03/30/2018   Procedure:  ENDOSCOPIC RETROGRADE CHOLANGIOPANCREATOGRAPHY (ERCP) WITH PROPOFOL;  Surgeon: Meridee Score Netty Starring., MD;  Location: [facility] ENDOSCOPY;  Service: Gastroenterology;  Laterality: N/A;   ENDOSCOPIC RETROGRADE CHOLANGIOPANCREATOGRAPHY (ERCP) WITH PROPOFOL N/A 06/05/2021   Procedure: ENDOSCOPIC RETROGRADE CHOLANGIOPANCREATOGRAPHY (ERCP) WITH PROPOFOL;  Surgeon: Meridee Score Netty Starring., MD;  Location: [facility] ENDOSCOPY;  Service: Gastroenterology;  Laterality: N/A;   ENDOSCOPIC RETROGRADE CHOLANGIOPANCREATOGRAPHY (ERCP) WITH PROPOFOL N/A 09/15/2021   Procedure: ENDOSCOPIC RETROGRADE CHOLANGIOPANCREATOGRAPHY (ERCP) WITH PROPOFOL;  Surgeon: Meridee Score Netty Starring., MD;  Location: [facility] ENDOSCOPY;  Service: Gastroenterology;  Laterality: N/A;   ERCP N/A 01/08/2018   Procedure: ENDOSCOPIC RETROGRADE CHOLANGIOPANCREATOGRAPHY (ERCP);  Surgeon: Lynann Bologna, MD;  Location: [facility] ENDOSCOPY;  Service: Endoscopy;  Laterality: N/A;   ERCP N/A 04/01/2021   Procedure: ENDOSCOPIC RETROGRADE CHOLANGIOPANCREATOGRAPHY (ERCP);  Surgeon: Lynann Bologna, MD;  Location: [facility] ENDOSCOPY;  Service: Endoscopy;  Laterality: N/A;  Please schedule after 1 PM   ESOPHAGOGASTRODUODENOSCOPY N/A 03/30/2018   Procedure: ESOPHAGOGASTRODUODENOSCOPY (EGD);  Surgeon: Lemar Lofty., MD;  Location: [facility] ENDOSCOPY;  Service: Gastroenterology;  Laterality: N/A;   ESOPHAGOGASTRODUODENOSCOPY (EGD) WITH PROPOFOL N/A 06/06/2018   Procedure: ESOPHAGOGASTRODUODENOSCOPY (EGD) WITH PROPOFOL;  Surgeon: Meridee Score Netty Starring., MD;  Location: [facility] ENDOSCOPY;  Service: Gastroenterology;  Laterality: N/A;   ESOPHAGOGASTRODUODENOSCOPY (EGD) WITH PROPOFOL N/A 07/06/2018   Procedure: ESOPHAGOGASTRODUODENOSCOPY (EGD) WITH PROPOFOL;  Surgeon: Meridee Score Netty Starring., MD;  Location: [facility] ENDOSCOPY;  Service: Gastroenterology;  Laterality: N/A;   ESOPHAGOGASTRODUODENOSCOPY (EGD) WITH PROPOFOL N/A 07/24/2021   Procedure: ESOPHAGOGASTRODUODENOSCOPY (EGD) WITH PROPOFOL WITH  FLUOROSCOPY;  Surgeon: Meridee Score Netty Starring., MD;  Location: [facility] ENDOSCOPY;  Service: Gastroenterology;  Laterality: N/A;   EUS N/A 07/24/2021   Procedure: UPPER ENDOSCOPIC ULTRASOUND (EUS) RADIAL;  Surgeon: Lemar Lofty., MD;  Location[facility] ENDOSCOPY;  Service: Gastroenterology;  Laterality: N/A;   lumbar spine stimulator     POLYPECTOMY  06/06/2018   Procedure: POLYPECTOMY;  Surgeon: Mansouraty, Netty Starring., MD;  Location: [facility] ENDOSCOPY;  Service: Gastroenterology;;   REMOVAL OF STONES  03/30/2018   Procedure: REMOVAL OF STONES;  Surgeon: Lemar Lofty., MD;  Location: [facility] ENDOSCOPY;  Service: Gastroenterology;;   REMOVAL OF STONES  06/05/2021   Procedure: REMOVAL OF STONES;  Surgeon: Lemar Lofty., MD;  Location: [facility] ENDOSCOPY;  Service: Gastroenterology;;   REMOVAL OF STONES  09/15/2021   Procedure: REMOVAL OF STONES;  Surgeon: Lemar Lofty., MD;  Location: [facility] ENDOSCOPY;  Service: Gastroenterology;;   Dennison Mascot  01/08/2018   Procedure: Dennison Mascot;  Surgeon: Lynann Bologna, MD;  Location: [facility] ENDOSCOPY;  Service: Endoscopy;;   SPHINCTEROTOMY  02/09/2018   Procedure: SPHINCTEROTOMY;  Surgeon: Lemar Lofty., MD;  Location: [facility] ENDOSCOPY;  Service: Gastroenterology;;   Burman Freestone CHOLANGIOSCOPY N/A 06/05/2021   Procedure: ZOXWRUEA CHOLANGIOSCOPY;  Surgeon: Lemar Lofty., MD;  Location: [facility] ENDOSCOPY;  Service: Gastroenterology;  Laterality: N/A;   STENT REMOVAL  02/09/2018   Procedure: STENT REMOVAL;  Surgeon: Lemar Lofty., MD;  Location: [facility] ENDOSCOPY;  Service: Gastroenterology;;   Francine Graven REMOVAL  03/30/2018   Procedure: STENT REMOVAL;  Surgeon: Lemar Lofty., MD;  Location: [facility] ENDOSCOPY;  Service: Gastroenterology;;   Francine Graven REMOVAL  06/05/2021   Procedure: STENT REMOVAL;  Surgeon: Lemar Lofty., MD;  Location: [facility] ENDOSCOPY;  Service: Gastroenterology;;   TRANSTHORACIC ECHOCARDIOGRAM  04/2015   EF  40-45%, mod antsept, ant, antlat, apical HK, mild AI/MR, PASP .   TRANSTHORACIC ECHOCARDIOGRAM  08/23/2020   EF 55 to 60%.  No RWMA.  Mild LVH.  GR 1 DD.  Normal RV size and function with normal RVP.  Normal mitral and aortic valve.   UVULOPALATOPHARYNGOPLASTY, TONSILLECTOMY AND SEPTOPLASTY  2002    Social History   Socioeconomic History   Marital status: Married    Spouse name: Robyn   Number of children: 3   Years of education: Not on file   Highest education level: Not on file  Occupational History   Occupation: retired  Tobacco Use   Smoking status: Former    Types: Cigarettes    Quit date: 1972    Years since quitting: 52.4   Smokeless tobacco: Never   Tobacco comments:    was very light  Vaping Use   Vaping Use: Never used  Substance and Sexual Activity   Alcohol use: Not Currently    Comment: ocassional -maybe 2 beers   Drug use: No   Sexual activity: Yes  Other Topics Concern   Not on file  Social History Narrative   Caffeine: 1 cup soda, occasional coffee   Lives with wife Zella Ball) and 46 yo son, 4 dogs   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   Activity: no regular activity   Diet: overall healthy, good fruits and vegetables, good amt water   Social Determinants of Health   Financial Resource Strain: Low Risk  (10/21/2021)   Overall Financial Resource Strain (CARDIA)    Difficulty of Paying Living Expenses: Not hard at all  Food Insecurity: No Food Insecurity (10/21/2021)   Hunger Vital Sign    Worried About Running Out of Food in the Last Year: Never true    Ran Out of Food in the Last Year: Never true  Transportation Needs: No Transportation Needs (10/21/2021)   PRAPARE - Administrator, Civil Service (Medical): No    Lack of Transportation (Non-Medical): No  Physical Activity: Insufficiently Active (10/21/2021)   Exercise Vital Sign    Days of Exercise per Week: 4 days    Minutes of  Exercise per Session: 30 min  Stress: No Stress Concern Present (10/21/2021)   [employer] of Occupational Health - Occupational Stress Questionnaire    Feeling of Stress : Not at all  Social Connections: Moderately Integrated (10/21/2021)   Social Connection and Isolation Panel [NHANES]    Frequency of Communication with Friends and Family: More than three times a week    Frequency of Social Gatherings with Friends and Family: Twice a week    Attends Religious Services: Never    Database administrator or Organizations: Yes    Attends  Club or Organization Meetings: More than 4 times per year    Marital Status: Married  Catering manager Violence: Not At Risk (10/21/2021)   Humiliation, Afraid, Rape, and Kick questionnaire    Fear of Current or Ex-Partner: No    Emotionally Abused: No    Physically Abused: No    Sexually Abused: No     No Known Allergies   Outpatient Medications Prior to Visit  Medication Sig Dispense Refill   acetaminophen (TYLENOL) 500 MG tablet Take 1,000 mg by mouth every 6 (six) hours as needed for moderate pain or headache.      Ascorbic Acid (VITAMIN C) 1000 MG tablet Take 1,000 mg by mouth daily.     aspirin EC 81 MG tablet Take 81 mg by mouth daily. Swallow whole.     Carboxymethylcellulose Sodium (LUBRICANT EYE DROPS OP) Place 2 drops into both eyes daily as needed (for dry eyes).     Cholecalciferol (VITAMIN D3) 50 MCG (2000 UT) TABS Take 2,000 Units by mouth daily.     doxycycline (VIBRA-TABS) 100 MG tablet Take 1 tablet (100 mg total) by mouth 2 (two) times daily. (Patient not taking: Reported on 07/17/2022) 14 tablet 0   gabapentin (NEURONTIN) 600 MG tablet TAKE 2 TABLETS BY MOUTH 3 TIMES  DAILY 540 tablet 3   ibuprofen (ADVIL) 200 MG tablet Take 400 mg by mouth every 6 (six) hours as needed for headache or moderate pain.     Magnesium 250 MG TABS Take 1 tablet (250 mg total) by mouth at bedtime. (Patient taking differently: Take 250 mg by mouth at  bedtime.)  0   mirabegron ER (MYRBETRIQ) 25 MG TB24 tablet TAKE 1 TABLET (25 MG TOTAL) BY MOUTH DAILY. 90 tablet 1   Multiple Vitamin (MULTIVITAMIN WITH MINERALS) TABS tablet Take 1 tablet by mouth daily. (Patient not taking: Reported on 08/21/2022)     Omega-3 Fatty Acids (FISH OIL) 1000 MG CAPS Take 1,000 mg by mouth daily.      omeprazole (PRILOSEC) 40 MG capsule Take 1 capsule (40 mg total) by mouth 2 (two) times daily before a meal. 180 capsule 0   predniSONE (DELTASONE) 20 MG tablet Take 2 tablets (40 mg total) by mouth daily. For 3 days, then 1 tab daily for 3 days (Patient not taking: Reported on 07/17/2022) 9 tablet 0   rosuvastatin (CRESTOR) 40 MG tablet TAKE 1 TABLET BY MOUTH DAILY 90 tablet 0   sildenafil (VIAGRA) 100 MG tablet TAKE 1/2 TO 1 TABLET BY MOUTH DAILY AS NEEDED FOR ERECTILE DYSFUNCTION (Patient taking differently: Take 50-100 mg by mouth daily as needed for erectile dysfunction.) 6 tablet 0   tamsulosin (FLOMAX) 0.4 MG CAPS capsule TAKE 1 CAPSULE BY MOUTH EVERY DAY 90 capsule 3   vitamin B-12 (CYANOCOBALAMIN) 1000 MCG tablet Take 1,000 mcg by mouth daily.     vitamin E 400 UNIT capsule Take 400 Units by mouth daily.     No facility-administered medications prior to visit.       Objective:   Physical Exam:  General appearance: 72 y.o., male, NAD, conversant  Eyes: anicteric sclerae; PERRL, tracking appropriately HENT: NCAT; MMM Neck: Trachea midline; no lymphadenopathy, no JVD Lungs: CTAB, no crackles, no wheeze, with normal respiratory effort CV: RRR, no murmur  Abdomen: Soft, non-tender; non-distended, BS present  Extremities: No peripheral edema, warm Skin: Normal turgor and texture; no rash Psych: Appropriate affect Neuro: Alert and oriented to person and place, no focal deficit  There were no vitals filed for this visit.     on RA BMI Readings from Last 3 Encounters:  08/21/22 31.22 kg/m  07/17/22 32.08 kg/m  05/29/22 30.96 kg/m   Wt  Readings from Last 3 Encounters:  08/21/22 217 lb 9.6 oz (98.7 kg)  07/17/22 223 lb 9.6 oz (101.4 kg)  05/29/22 222 lb (100.7 kg)     CBC    Component Value Date/Time   WBC 7.1 05/04/2022 1143   RBC 4.51 05/04/2022 1143   HGB 14.8 05/04/2022 1143   HCT 42.9 05/04/2022 1143   PLT 223.0 05/04/2022 1143   MCV 95.0 05/04/2022 1143   MCH 32.2 04/03/2021 0037   MCHC 34.4 05/04/2022 1143   RDW 13.5 05/04/2022 1143   LYMPHSABS 2.6 05/04/2022 1143   MONOABS 0.5 05/04/2022 1143   EOSABS 0.4 05/04/2022 1143   BASOSABS 0.1 05/04/2022 1143    Chest Imaging: CXR 05/29/22 with low lung volumes, coarse interstitial markings  CT AP 06/20/21 with bilateral lower lobe predominant fibrosis, reticular opacities, no honeycombing  HRCT with widespread reticular opacities, subpleural fibrosis with traction bronchiolectasis without craniocaudal gradient, mild air trapping  Pulmonary Functions Testing Results:    Latest Ref Rng & Units 08/21/2022   10:42 AM 08/01/2015    2:22 PM  PFT Results  FVC-Pre L 2.42  5.42  P  FVC-Predicted Pre % 55  113  P  FVC-Post L 2.12  5.35  P  FVC-Predicted Post % 48  112  P  Pre FEV1/FVC % % 84  85  P  Post FEV1/FCV % % 90  87  P  FEV1-Pre L 2.04  4.58  P  FEV1-Predicted Pre % 64  129  P  FEV1-Post L 1.92  4.65  P  DLCO uncorrected ml/min/mmHg 17.86  20.37  P  DLCO UNC% % 69  60  P  DLCO corrected ml/min/mmHg 17.86    DLCO COR %Predicted % 69    DLVA Predicted % 93  57  P  TLC L 4.08  7.55  P  TLC % Predicted % 58  104  P  RV % Predicted % 59  82  P    P Preliminary result   PFT 2017 with mildly reduced diffusing capacity    Echocardiogram:    1. Left ventricular ejection fraction, by estimation, is 55 to 60%. The  left ventricle has normal function. The left ventricle has no regional  wall motion abnormalities. There is moderate left ventricular hypertrophy.  Left ventricular diastolic  parameters are consistent with Grade I diastolic dysfunction  (impaired  relaxation).   2. Right ventricular systolic function is normal. The right ventricular  size is normal.   3. The mitral valve is normal in structure. Trivial mitral valve  regurgitation. No evidence of mitral stenosis.   4. The aortic valve is tricuspid. Aortic valve regurgitation is mild. No  aortic stenosis is present.       Assessment & Plan:   # Chronic cough Components of likely PND, GERD though worry about progressive fibrotic lung disease as below  # ILD Unclear etiology. HRCT alternative diagnosis - not UIP. Possible chronic HP given long term dust exposure/gardening. In past attributed to ARDS/ALI though doesn't look like he had ARDS or significant lung injury when he was hospitalized in 2016. Had about 6 months of amiodarone exposure in 2016 for AF. Nothing concerning by history for underlying CTD though CK is elevated raising possibility of myositis however may just be  elevated from statin, polio, and/or dehydration. Potentially related to severe reflux/microaspiration given some lower lobe predominance.  # OSA not on CPAP   Plan: - aldolase, myomarker panel - home sleep study you'll be called to schedule - I'll see if we can discuss your case at next multidisciplinary ILD conference. We would be weighing starting prednisone and another immunosuppressing medication like cellcept vs instead potentially referring for surgical lung biopsy or pursuing bronchoscopy.    I spent 50 minutes dedicated to the care of this patient on the date of this encounter to include pre-visit review of records, face-to-face time with the patient discussing conditions above, post visit ordering of testing, clinical documentation with the electronic health record, making appropriate referrals as documented, and communicating necessary findings to members of the patients care team.    [name], MD [facility] 10/19/2022 6:18 PM

## 2022-10-19 NOTE — H&P (View-Only) (Signed)
Synopsis: Referred for chronic cough, post covid issues by Alexander Boyden, MD  Subjective:   PATIENT ID: Alexander [name] GENDER: male DOB: November 26, 1950, MRN: [id number]  Chief Complaint  Patient presents with   Follow-up    He has occ cough with prod with white sputum. His breathing is unchanged.    CC: dyspnea, cough  72yM with history of CAD, HTN, ICM, OSA not on CPAP, pAF, very light smoking in past. Had covid-19 a year ago in January was not hospitalized with it.   Coughing, short of breath, little energy over last 4 months. Cough is dry occasionally productive of sputum but he just swallows it. He does have sinus congestion and postnasal drainage. He tried flonase on and off since November. Benadryl here and there. He is on omeprazole BID for reflux. No solid/liquid food dysphagia.   Got steroid course in October/Nov, steroids did not help. Had another round of steroids/antibiotic which did not help.   Had polio as a kid and started to need braces/crutches in 2002.   Some morning stiffness but takes 10-15 min to loosen up, no rashes, raynaud's phenomenon, ulcers in mouth or nose.   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 Some exposure to cleaning agents in past, some solvents. No MJ, vaping. Born in [place], lived for 40y in [place] and then was in [place] in [place], moved to [place]. 3 dogs. No hot tub.   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  Overall feels more energetic than at last visit.   Otherwise pertinent review of systems is negative.   Past Medical History:  Diagnosis Date   Acute cholecystitis 03/2016   with sepsis   Arthritis    CAD S/P percutaneous coronary angioplasty 1995, 2000, 2005   a. 1995 s/p BMS;  b. 2000 s/p BMS;  c. 2005 s/p stent - All stents in [facility] (RCA, LAD & OM - unknown on which date);  d. 04/2015 NSTEMI/Cath: LM nl, ost/p LAD 20%, patent mLAD stent, RI small, OM2 patent stent, pRCA 20%, patent stent, EF 45-50%-->Med Rx.   Calculus of bile duct with acute cholangitis with obstruction    Chronic pain syndrome    a. Followed @ [facility];  b. Uses 12-14 excedrins per day.   Complication of anesthesia 04/01/2021   agiated- "battling nurses"   Duodenal stricture    Essential hypertension    Fatty liver    Gallstones    GERD (gastroesophageal reflux disease)    History of depression    History of post poliomyelitis muscular atrophy    HLD (hyperlipidemia)    Ischemic cardiomyopathy    a. 04/2015 Echo: EF 40-45%, mod antsept, ant, antlat, apical HK, mild  AI/MR, PASP .   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 04/2015 in setting of cholecystits and sepsis -->Amio;  b. CHA2DS2VASc = 4--> Eliquis.   Pneumonia 04/2015   Post-polio syndrome    a. ambulates with braces.   Septic shock (HCC) 01/07/2018   Shoulder pain, right      Family History  Problem Relation Age of Onset   Diabetes Mother    Heart disease Mother    Cancer Sister 48       male cancer   Breast cancer Brother 21    Hypertension Maternal Aunt    Cancer Maternal Uncle        spinal   Coronary artery disease Neg Hx    Stroke Neg Hx    Colon cancer Neg Hx    Pancreatic cancer Neg Hx    Stomach cancer Neg Hx    Esophageal cancer Neg Hx    Inflammatory bowel disease Neg Hx    Liver disease Neg Hx    Rectal cancer Neg Hx      Past Surgical History:  Procedure Laterality Date   BALLOON DILATION N/A 06/06/2018   Procedure: BALLOON DILATION;  Surgeon: Mansouraty, Netty Starring., MD;  Location: [facility] ENDOSCOPY;  Service: Gastroenterology;  Laterality: N/A;   BALLOON DILATION N/A 07/06/2018   Procedure: BALLOON DILATION;  Surgeon: Meridee Score Netty Starring., MD;  Location: [facility] ENDOSCOPY;  Service: Gastroenterology;  Laterality: N/A;   BALLOON DILATION N/A 07/24/2021   Procedure: BALLOON DILATION;  Surgeon: Meridee Score Netty Starring., MD;  Location: [facility] ENDOSCOPY;  Service: Gastroenterology;  Laterality: N/A;   BILIARY BRUSHING  06/05/2021   Procedure: BILIARY BRUSHING;  Surgeon: Meridee Score Netty Starring., MD;  Location: [facility] ENDOSCOPY;  Service: Gastroenterology;;   BILIARY DILATION  06/05/2021   Procedure: BILIARY DILATION;  Surgeon: Lemar Lofty., MD;  Location: [facility] ENDOSCOPY;  Service: Gastroenterology;;   BILIARY DILATION  09/15/2021   Procedure: BILIARY DILATION;  Surgeon: Lemar Lofty., MD;  Location: [facility] ENDOSCOPY;  Service: Gastroenterology;;   BILIARY STENT PLACEMENT  01/08/2018   Procedure: BILIARY STENT PLACEMENT;  Surgeon: Lynann Bologna, MD;  Location: [facility] ENDOSCOPY;  Service: Endoscopy;;   BILIARY STENT PLACEMENT N/A 02/09/2018   Procedure: BILIARY STENT PLACEMENT;  Surgeon: Lemar Lofty., MD;  Location: [facility] ENDOSCOPY;  Service: Gastroenterology;  Laterality: N/A;   BILIARY STENT PLACEMENT  04/01/2021   Procedure: BILIARY STENT PLACEMENT;  Surgeon: Lynann Bologna, MD;  Location: [facility] ENDOSCOPY;  Service: Endoscopy;;   BIOPSY  06/06/2018   Procedure: BIOPSY;  Surgeon: Lemar Lofty., MD;  Location: [facility] ENDOSCOPY;  Service: Gastroenterology;;   BIOPSY  04/01/2021   Procedure: BIOPSY;  Surgeon: Lynann Bologna, MD;  Location: [facility] ENDOSCOPY;  Service: Endoscopy;;   BIOPSY  06/05/2021   Procedure: BIOPSY;  Surgeon: Lemar Lofty., MD;  Location: [facility] ENDOSCOPY;  Service: Gastroenterology;;   CARDIAC CATHETERIZATION N/A 04/30/2015   Procedure: Left Heart Cath and Coronary Angiography;  Surgeon: Lennette Bihari, MD;  Location: [facility] INVASIVE CV LAB;  Service: Cardiovascular; LM nl, ost/p LAD 20%, patent mLAD stent, RI small, OM2 patent stent, pRCA 20%, patent stent, EF 45-50%-->Med Rx    cervical spine stimulator  08/2012   CHOLECYSTECTOMY  06/10/2015   Procedure: LAPAROSCOPIC CHOLECYSTECTOMY;  Surgeon: Rodman Pickle, MD;  Location: [facility]  OR;  Service: General;;   COLONOSCOPY  09/2013   TA x2, rpt 5 yrs (Pyrtle)   COLONOSCOPY WITH ESOPHAGOGASTRODUODENOSCOPY (EGD)  06/2018   reactive gastropathy, tubular adenoma, rpt colonoscopy 5 yrs (Mansouraty)   COLONOSCOPY WITH PROPOFOL N/A 06/06/2018   Procedure: COLONOSCOPY WITH PROPOFOL;  Surgeon: Meridee Score Netty Starring., MD;  Location: [facility] ENDOSCOPY;  Service: Gastroenterology;  Laterality: N/A;   CORONARY ANGIOPLASTY WITH STENT PLACEMENT  1995, 2000, 2005   stents in RCA, LAD & Cx-OM. -Stents were patent by cardiac catheterization in November 2016   ENDOSCOPIC RETROGRADE CHOLANGIOPANCREATOGRAPHY (ERCP) WITH PROPOFOL N/A 02/09/2018   Procedure: ENDOSCOPIC RETROGRADE CHOLANGIOPANCREATOGRAPHY (ERCP) WITH PROPOFOL;  Surgeon: Lemar Lofty., MD;  Location: [facility] ENDOSCOPY;  Service: Gastroenterology;  Laterality: N/A;   ENDOSCOPIC RETROGRADE CHOLANGIOPANCREATOGRAPHY (ERCP) WITH PROPOFOL N/A 03/30/2018   Procedure: ENDOSCOPIC RETROGRADE CHOLANGIOPANCREATOGRAPHY (ERCP) WITH PROPOFOL;  Surgeon: Meridee Score Netty Starring., MD;  Location: [facility] ENDOSCOPY;  Service: Gastroenterology;  Laterality: N/A;   ENDOSCOPIC RETROGRADE  CHOLANGIOPANCREATOGRAPHY (ERCP) WITH PROPOFOL N/A 06/05/2021   Procedure: ENDOSCOPIC RETROGRADE CHOLANGIOPANCREATOGRAPHY (ERCP) WITH PROPOFOL;  Surgeon: Meridee Score Netty Starring., MD;  Location: [facility] ENDOSCOPY;  Service: Gastroenterology;  Laterality: N/A;   ENDOSCOPIC RETROGRADE CHOLANGIOPANCREATOGRAPHY (ERCP) WITH PROPOFOL N/A 09/15/2021   Procedure: ENDOSCOPIC RETROGRADE CHOLANGIOPANCREATOGRAPHY (ERCP) WITH PROPOFOL;  Surgeon: Meridee Score Netty Starring., MD;  Location: [facility] ENDOSCOPY;  Service: Gastroenterology;  Laterality: N/A;   ERCP N/A 01/08/2018   Procedure: ENDOSCOPIC RETROGRADE CHOLANGIOPANCREATOGRAPHY (ERCP);  Surgeon: Lynann Bologna, MD;  Location: [facility] ENDOSCOPY;  Service: Endoscopy;  Laterality: N/A;   ERCP N/A 04/01/2021   Procedure: ENDOSCOPIC RETROGRADE CHOLANGIOPANCREATOGRAPHY (ERCP);  Surgeon: Lynann Bologna, MD;  Location: [facility] ENDOSCOPY;  Service: Endoscopy;  Laterality: N/A;  Please schedule after 1 PM   ESOPHAGOGASTRODUODENOSCOPY N/A 03/30/2018   Procedure: ESOPHAGOGASTRODUODENOSCOPY (EGD);  Surgeon: Lemar Lofty., MD;  Location: [facility] ENDOSCOPY;  Service: Gastroenterology;  Laterality: N/A;   ESOPHAGOGASTRODUODENOSCOPY (EGD) WITH PROPOFOL N/A 06/06/2018   Procedure: ESOPHAGOGASTRODUODENOSCOPY (EGD) WITH PROPOFOL;  Surgeon: Meridee Score Netty Starring., MD;  Location: [facility] ENDOSCOPY;  Service: Gastroenterology;  Laterality: N/A;   ESOPHAGOGASTRODUODENOSCOPY (EGD) WITH PROPOFOL N/A 07/06/2018   Procedure: ESOPHAGOGASTRODUODENOSCOPY (EGD) WITH PROPOFOL;  Surgeon: Meridee Score Netty Starring., MD;  Location: [facility] ENDOSCOPY;  Service: Gastroenterology;  Laterality: N/A;   ESOPHAGOGASTRODUODENOSCOPY (EGD) WITH PROPOFOL N/A 07/24/2021   Procedure: ESOPHAGOGASTRODUODENOSCOPY (EGD) WITH PROPOFOL WITH FLUOROSCOPY;  Surgeon: Meridee Score Netty Starring., MD;  Location: [facility] ENDOSCOPY;  Service: Gastroenterology;  Laterality: N/A;   EUS N/A 07/24/2021   Procedure: UPPER ENDOSCOPIC ULTRASOUND (EUS) RADIAL;  Surgeon:  Lemar Lofty., MD;  Location: [facility] ENDOSCOPY;  Service: Gastroenterology;  Laterality: N/A;   lumbar spine stimulator     POLYPECTOMY  06/06/2018   Procedure: POLYPECTOMY;  Surgeon: Mansouraty, Netty Starring., MD;  Location: [facility] ENDOSCOPY;  Service: Gastroenterology;;   REMOVAL OF STONES  03/30/2018   Procedure: REMOVAL OF STONES;  Surgeon: Lemar Lofty., MD;  Location: [facility] ENDOSCOPY;  Service: Gastroenterology;;   REMOVAL OF STONES  06/05/2021   Procedure: REMOVAL OF STONES;  Surgeon: Lemar Lofty., MD;  Location: [facility] ENDOSCOPY;  Service: Gastroenterology;;   REMOVAL OF STONES  09/15/2021   Procedure: REMOVAL OF STONES;  Surgeon: Lemar Lofty., MD;  Location: [facility] ENDOSCOPY;  Service: Gastroenterology;;   Dennison Mascot  01/08/2018   Procedure: Dennison Mascot;  Surgeon: Lynann Bologna, MD;  Location: [facility] ENDOSCOPY;  Service: Endoscopy;;   SPHINCTEROTOMY  02/09/2018   Procedure: Dennison Mascot;  Surgeon: Mansouraty, Netty Starring., MD;  Location: [facility] ENDOSCOPY;  Service: Gastroenterology;;   [facility] CHOLANGIOSCOPY N/A  06/05/2021   Procedure: SPYGLASS CHOLANGIOSCOPY;  Surgeon: Lemar Lofty., MD;  Location: [facility] ENDOSCOPY;  Service: Gastroenterology;  Laterality: N/A;   STENT REMOVAL  02/09/2018   Procedure: STENT REMOVAL;  Surgeon: Lemar Lofty., MD;  Location: [facility] ENDOSCOPY;  Service: Gastroenterology;;   Francine Graven REMOVAL  03/30/2018   Procedure: STENT REMOVAL;  Surgeon: Lemar Lofty., MD;  Location: [facility] ENDOSCOPY;  Service: Gastroenterology;;   Francine Graven REMOVAL  06/05/2021   Procedure: STENT REMOVAL;  Surgeon: Lemar Lofty., MD;  Location: [facility] ENDOSCOPY;  Service: Gastroenterology;;   TRANSTHORACIC ECHOCARDIOGRAM  04/2015   EF 40-45%, mod antsept, ant, antlat, apical HK, mild AI/MR, PASP .   TRANSTHORACIC ECHOCARDIOGRAM  08/23/2020   EF 55 to 60%.  No RWMA.  Mild LVH.  GR 1 DD.  Normal RV size and function with normal RVP.  Normal  mitral and aortic valve.   UVULOPALATOPHARYNGOPLASTY, TONSILLECTOMY AND SEPTOPLASTY  2002    Social History   Socioeconomic History   Marital status: Married    Spouse name: Robyn   Number of children: 3   Years of education: Not on file   Highest education level: Not on file  Occupational History   Occupation: retired  Tobacco Use   Smoking status: Former    Types: Cigarettes    Quit date: 1972    Years since quitting: 52.4   Smokeless tobacco: Never   Tobacco comments:    was very light  Vaping Use   Vaping Use: Never used  Substance and Sexual Activity   Alcohol use: Not Currently    Comment: ocassional -maybe 2 beers   Drug use: No   Sexual activity: Yes  Other Topics Concern   Not on file  Social History Narrative   Caffeine: 1 cup soda, occasional coffee   Lives with wife Zella Ball) and 9 yo son, 4 dogs   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   Activity: no regular activity   Diet: overall healthy, good fruits and vegetables, good amt water   Social Determinants of Health   Financial Resource Strain: Low Risk  (10/21/2021)   Overall Financial Resource Strain (CARDIA)    Difficulty of Paying Living Expenses: Not hard at all  Food Insecurity: No Food Insecurity (10/21/2021)   Hunger Vital Sign    Worried About Running Out of Food in the Last Year: Never true    Ran Out of Food in the Last Year: Never true  Transportation Needs: No Transportation Needs (10/21/2021)   PRAPARE - Administrator, Civil Service (Medical): No    Lack of Transportation (Non-Medical): No  Physical Activity: Insufficiently Active (10/21/2021)   Exercise Vital Sign    Days of Exercise per Week: 4 days    Minutes of Exercise per Session: 30 min  Stress: No Stress Concern Present (10/21/2021)   [employer] of Occupational Health - Occupational Stress Questionnaire    Feeling of Stress : Not at all  Social Connections:  Moderately Integrated (10/21/2021)   Social Connection and Isolation Panel [NHANES]    Frequency of Communication with Friends and Family: More than three times a week    Frequency of Social Gatherings with Friends and Family: Twice a week    Attends Religious Services: Never    Database administrator or Organizations: Yes    Attends Engineer, structural: More than 4 times per year    Marital Status: Married  Intimate Partner Violence: Not At Risk (10/21/2021)   Humiliation, Afraid, Rape, and Kick questionnaire    Fear of Current or Ex-Partner: No    Emotionally Abused: No    Physically Abused: No    Sexually Abused: No     No Known Allergies   Outpatient Medications Prior to Visit  Medication Sig Dispense Refill   acetaminophen (TYLENOL) 500 MG tablet Take 1,000 mg by mouth every 6 (six) hours as needed for moderate pain or headache.      Ascorbic Acid (VITAMIN C) 1000 MG tablet Take 1,000 mg by mouth daily.     aspirin EC 81 MG tablet Take 81 mg by mouth daily. Swallow whole.     Carboxymethylcellulose Sodium (LUBRICANT EYE DROPS OP) Place 2 drops into both eyes daily as needed (for dry eyes).     Cholecalciferol (VITAMIN D3) 50 MCG (2000 UT) TABS Take 2,000 Units by mouth daily.     gabapentin (NEURONTIN) 600 MG tablet TAKE 2 TABLETS BY MOUTH 3 TIMES  DAILY 540 tablet 3   ibuprofen (ADVIL) 200 MG tablet Take 400 mg by mouth every 6 (six) hours as needed for headache or moderate pain.     Magnesium 250 MG TABS Take 1 tablet (250 mg total) by mouth at bedtime. (Patient taking differently: Take 250 mg by mouth at bedtime.)  0   mirabegron ER (MYRBETRIQ) 25 MG TB24 tablet TAKE 1 TABLET (25 MG TOTAL) BY MOUTH DAILY. 90 tablet 1   Multiple Vitamin (MULTIVITAMIN WITH MINERALS) TABS tablet Take 1 tablet by mouth daily.     Omega-3 Fatty Acids (FISH OIL) 1000 MG CAPS Take 1,000 mg by mouth daily.      omeprazole (PRILOSEC) 40 MG capsule Take 1 capsule (40 mg total) by mouth 2 (two)  times daily before a meal. 180 capsule 0   rosuvastatin (CRESTOR) 40 MG tablet TAKE 1 TABLET BY MOUTH DAILY 90 tablet 0   sildenafil (VIAGRA) 100 MG tablet TAKE 1/2 TO 1 TABLET BY MOUTH DAILY AS NEEDED FOR ERECTILE DYSFUNCTION (Patient taking differently: Take 50-100 mg by mouth daily as needed for erectile dysfunction.) 6 tablet 0   tamsulosin (FLOMAX) 0.4 MG CAPS capsule TAKE 1 CAPSULE BY MOUTH EVERY DAY 90 capsule 3   vitamin B-12 (CYANOCOBALAMIN) 1000 MCG tablet Take 1,000 mcg by mouth daily.     vitamin E 400 UNIT capsule Take 400 Units by mouth daily.     doxycycline (VIBRA-TABS) 100 MG tablet Take 1 tablet (100 mg total) by mouth 2 (two) times daily. 14 tablet 0   predniSONE (DELTASONE) 20 MG tablet Take 2 tablets (40 mg total) by mouth daily. For 3 days, then 1 tab daily for 3 days 9 tablet 0   No facility-administered medications prior to visit.       Objective:   Physical Exam:  General appearance: 72 y.o., male, NAD, conversant  Eyes: anicteric sclerae; PERRL, tracking appropriately HENT: NCAT; MMM Neck: Trachea midline; no lymphadenopathy, no JVD Lungs: CTAB, no crackles, no wheeze, with normal respiratory effort CV: RRR, no murmur  Abdomen: Soft, non-tender; non-distended, BS present  Extremities: No peripheral edema, warm Skin: Normal turgor and texture; no rash Psych: Appropriate affect Neuro: Alert and oriented to person and place, no focal deficit     Vitals:   10/21/22 1013  BP: 120/68  Pulse: 84  Temp: 98.2 F (36.8 C)  TempSrc: Oral  SpO2: 96%  Weight: 216 lb (98 kg)  Height: 5\' 10"  (1.778 m)  96% on RA BMI Readings from Last 3 Encounters:  10/21/22 30.99 kg/m  08/21/22 31.22 kg/m  07/17/22 32.08 kg/m   Wt Readings from Last 3 Encounters:  10/21/22 216 lb (98 kg)  08/21/22 217 lb 9.6 oz (98.7 kg)  07/17/22 223 lb 9.6 oz (101.4 kg)     CBC    Component Value Date/Time   WBC 7.1 05/04/2022 1143   RBC 4.51 05/04/2022 1143   HGB  14.8 05/04/2022 1143   HCT 42.9 05/04/2022 1143   PLT 223.0 05/04/2022 1143   MCV 95.0 05/04/2022 1143   MCH 32.2 04/03/2021 0037   MCHC 34.4 05/04/2022 1143   RDW 13.5 05/04/2022 1143   LYMPHSABS 2.6 05/04/2022 1143   MONOABS 0.5 05/04/2022 1143   EOSABS 0.4 05/04/2022 1143   BASOSABS 0.1 05/04/2022 1143    Chest Imaging: CXR 05/29/22 with low lung volumes, coarse interstitial markings  CT AP 06/20/21 with bilateral lower lobe predominant fibrosis, reticular opacities, no honeycombing  HRCT with widespread reticular opacities, subpleural fibrosis with traction bronchiolectasis without craniocaudal gradient, mild air trapping  Pulmonary Functions Testing Results:    Latest Ref Rng & Units 08/21/2022   10:42 AM 08/01/2015    2:22 PM  PFT Results  FVC-Pre L 2.42  5.42  P  FVC-Predicted Pre % 55  113  P  FVC-Post L 2.12  5.35  P  FVC-Predicted Post % 48  112  P  Pre FEV1/FVC % % 84  85  P  Post FEV1/FCV % % 90  87  P  FEV1-Pre L 2.04  4.58  P  FEV1-Predicted Pre % 64  129  P  FEV1-Post L 1.92  4.65  P  DLCO uncorrected ml/min/mmHg 17.86  20.37  P  DLCO UNC% % 69  60  P  DLCO corrected ml/min/mmHg 17.86    DLCO COR %Predicted % 69    DLVA Predicted % 93  57  P  TLC L 4.08  7.55  P  TLC % Predicted % 58  104  P  RV % Predicted % 59  82  P    P Preliminary result   PFT 2017 with mildly reduced diffusing capacity    Echocardiogram:    1. Left ventricular ejection fraction, by estimation, is 55 to 60%. The  left ventricle has normal function. The left ventricle has no regional  wall motion abnormalities. There is moderate left ventricular hypertrophy.  Left ventricular diastolic  parameters are consistent with Grade I diastolic dysfunction (impaired  relaxation).   2. Right ventricular systolic function is normal. The right ventricular  size is normal.   3. The mitral valve is normal in structure. Trivial mitral valve  regurgitation. No evidence of mitral stenosis.    4. The aortic valve is tricuspid. Aortic valve regurgitation is mild. No  aortic stenosis is present.       Assessment & Plan:   # Chronic cough Components of likely PND, GERD though worry about progressive fibrotic lung disease as below  # ILD Unclear etiology. HRCT alternative diagnosis - not UIP. Possible chronic HP given long term dust exposure/gardening and this is favored after discussion in ILD conference. In past attributed to ARDS/ALI though doesn't look like he had ARDS or significant lung injury when he was hospitalized in 2016. Had about 6 months of amiodarone exposure in 2016 for AF. Nothing concerning by history for underlying CTD though CK is elevated raising possibility of myositis however may just be elevated from  statin, polio, and/or dehydration. Potentially related to severe reflux/microaspiration given some lower lobe predominance.  # OSA not on CPAP  # Chronic hypoxic respiratory failure   Plan: - HP panel - bronch/BAL/TBLB under general 5/30. Discussed risks including respiratory failure 5-10%, ptx needing chest tube 1%, ILD flare, major bleeding 06/998. Agrees to proceed - start autoPAP 5-15 with dreamwear mask, check ONO with CPAP - will ensure started on POC 2L O2 with exertion   [name], MD [place] [facility] 10/21/2022 10:20 AM

## 2022-10-21 ENCOUNTER — Encounter: Payer: Self-pay | Admitting: Student

## 2022-10-21 ENCOUNTER — Telehealth: Payer: Self-pay | Admitting: Student

## 2022-10-21 ENCOUNTER — Ambulatory Visit: Payer: Medicare Other | Admitting: Student

## 2022-10-21 VITALS — BP 120/68 | HR 84 | Temp 98.2°F | Ht 70.0 in | Wt 216.0 lb

## 2022-10-21 DIAGNOSIS — Z01812 Encounter for preprocedural laboratory examination: Secondary | ICD-10-CM | POA: Diagnosis not present

## 2022-10-21 DIAGNOSIS — J849 Interstitial pulmonary disease, unspecified: Secondary | ICD-10-CM | POA: Diagnosis not present

## 2022-10-21 DIAGNOSIS — G4733 Obstructive sleep apnea (adult) (pediatric): Secondary | ICD-10-CM | POA: Diagnosis not present

## 2022-10-21 NOTE — Telephone Encounter (Signed)
Bronch order

## 2022-10-21 NOTE — Telephone Encounter (Signed)
Bronch has been scheduled.  Appt info in referral.  Will close out message.

## 2022-10-21 NOTE — Patient Instructions (Addendum)
-   nothing to eat or drink after midnight the night before your bronch - no aspirin for 5 days before bronch and try to limit motrin use in those 5 days to decrease your risk of bleeding - we will set up CPAP and measure your oxygen overnight on it - see you in 4 weeks to review bronch results

## 2022-10-23 LAB — HYPERSENSITIVITY PNEUMONITIS
A. Pullulans Abs: NEGATIVE
A.Fumigatus #1 Abs: NEGATIVE
Micropolyspora faeni, IgG: NEGATIVE
Pigeon Serum Abs: NEGATIVE
Thermoact. Saccharii: NEGATIVE
Thermoactinomyces vulgaris, IgG: NEGATIVE

## 2022-10-27 ENCOUNTER — Other Ambulatory Visit: Payer: Medicare Other

## 2022-10-27 DIAGNOSIS — Z01812 Encounter for preprocedural laboratory examination: Secondary | ICD-10-CM

## 2022-10-28 ENCOUNTER — Other Ambulatory Visit: Payer: Self-pay

## 2022-10-28 ENCOUNTER — Encounter (HOSPITAL_COMMUNITY): Payer: Self-pay | Admitting: Student

## 2022-10-28 NOTE — Progress Notes (Signed)
SDW call  Patient was given pre-op instructions over the phone. Patient verbalized understanding of instructions provided.    PCP - Dr. Ruthine Dose Cardiologist - Dr. Bryan Lemma Pulmonary: Dr. Thora Lance   PPM/ICD - Denies  Patient has spinal stimulator and has been instructed to bring the remote with them to the hospital  Xray 05/29/2022  EKG -  DOS 10/29/2022 Stress Test - ECHO - 08/23/2020 Cardiac Cath - 04/30/2015  Sleep Study/sleep apnea/CPAP: Diagnosed with sleep apnea, does not  have a CPAP yet  Non diabetic   Blood Thinner Instructions: Denies Aspirin Instructions: states last dose has been months ago   ERAS Protcol - No, NPO PRE-SURGERY Ensure or G2-    COVID TEST- Yes 10/27/2022 at [facility]    Anesthesia review: Yes. HTN, CAD, OSA, A-fib, aggressive after surgery   Patient denies shortness of breath, fever, cough and chest pain over the phone call   Your procedure is scheduled on   Report to [facility] Main Entrance "A" at 0615    A.M., then check in with the Admitting office.  Call this number if you have problems the morning of surgery:  [phone]   If you have any questions prior to your surgery date call [phone]: Open Monday-Friday 8am-4pm If you experience any cold or flu symptoms such as cough, fever, chills, shortness of breath, etc. between now and your scheduled surgery, please notify us at the above number    Remember:  Do not eat or drink after midnight the night before your surgery  Take these medicines the morning of surgery with A SIP OF WATER:  Gabapentin, omeprazole, crestor, flomax, myrbetriq  As needed: tylenol  As of today, STOP taking any Aspirin (unless otherwise instructed by your surgeon) Aleve, Naproxen, Ibuprofen, Motrin, Advil, Goody's, BC's, all herbal medications, fish oil, and all vitamins.

## 2022-10-28 NOTE — Progress Notes (Signed)
Case: [id number] Date/Time: 10/29/22 0845   Procedure: VIDEO BRONCHOSCOPY WITH FLUORO   Anesthesia type: General   Pre-op diagnosis: INTERSTITIAL LUNG DISEASE   [facility] / [facility]   Surgeons: [name], MD       DISCUSSION: Alexander [name] is a 72 year old male who is being evaluated prior to procedure listed above. Patient has a hx of ILD of unclear etiology and is followed by Pulmonology. He has a chronic cough and SOB. Had a high resolution CT scan of his chest which was concerning for worsening fibrotic lung disease and is now scheduled for procedure above. He also has chronic respiratory failure now and was started on 2L O2 with exertion and CPAP at night (has not started this yet).  Other PMH significant for HTN, CAD s/p PCI in 1995, 2000, 2005 2016, hx of unstable angina, ICM (recovered EF), pAF, OSA, GERD, former smoking, and  hx of polio as a child (now ambulates with brace), chronic back pain s/p spinal stimulator in 2014  Patient last saw Cardiology on 10/13/21. Was noted to be doing well at that time. Had fatigued which improved after stopping BB. Pt is not anticoagulated due to A.fib because this was thought to be due to having sepsis and has not been recurrent. Pt denies any chest pain at PAT assessment. Has chronic SOB due to ILD. Ok to proceed per conversation with Dr. Nance Pew.  Patient also follows with PCP for other chronic medical issues. Last seen in office on 05/29/22. BP has been controlled. GERD is medicated.  Prior anesthesia complications include being "aggressive after surgery" and "battling nurses" after ERCP on 04/01/2021   VS:     10/21/2022   10:13 AM 08/21/2022    1:02 PM 07/17/2022    1:15 PM  Vitals with BMI  Height 5\' 10"  5\' 10"  5\' 10"   Weight 216 lbs 217 lbs 10 oz 223 lbs 10 oz  BMI 30.99 31.22 32.08  Systolic 120 108 147  Diastolic 68 72 74  Pulse 84 98 74     PROVIDERS:  PCP - Dr. Ruthine Dose Cardiologist - Dr. Bryan Lemma Pulmonary: Dr. Felisa Bonier   LABS: Obtain DOS (all labs ordered are listed, but only abnormal results are displayed)  Labs Reviewed - No data to display   IMAGES:  CT chest high resolution 07/24/22:  IMPRESSION: 1. The appearance of the lungs is indicative of interstitial lung disease, with a spectrum of findings that is most suggestive of an alternative diagnosis (not usual interstitial pneumonia) per current ATS guidelines. Given the lack of craniocaudal gradient and mild air trapping, this may reflect chronic hypersensitivity pneumonitis, although UIP is not entirely excluded. Repeat high-resolution chest CT is recommended in 12 months to assess for temporal changes in the appearance of the lung parenchyma. 2. Aortic atherosclerosis, in addition to left main and three-vessel coronary artery disease. Assessment for potential risk factor modification, dietary therapy or pharmacologic therapy may be warranted, if clinically indicated. 3. Hepatic steatosis. 4. Additional incidental findings, as above.   Aortic Atherosclerosis (ICD10-I70.0).   EKG 10/13/21:  NSR-67 bpm, LVH with mild repolarization changes. Borderline incomplete RBBB    CV: Echocardiogram:  08/23/2020    1. Left ventricular ejection fraction, by estimation, is 55 to 60%. The  left ventricle has normal function. The left ventricle has no regional  wall motion abnormalities. There is moderate left ventricular hypertrophy.  Left ventricular diastolic  parameters are consistent with Grade I  diastolic dysfunction (impaired  relaxation).   2. Right ventricular systolic function is normal. The right ventricular  size is normal.   3. The mitral valve is normal in structure. Trivial mitral valve  regurgitation. No evidence of mitral stenosis.   4. The aortic valve is tricuspid. Aortic valve regurgitation is mild. No  aortic stenosis is present.   Per Dr.  Herbie Baltimore: "Good news.  Echocardiogram is pretty normal.   Ejection fraction remains 55 to 60% with no wall motion normalities (which would have indicated prior heart attack).  Grade 1 diastolic dysfunction which is normal for 72 year old.  With normal left atrial size, this is not usually consistent with diastolic heart failure.   Normal valve structure and function."   LHC 04/30/2015:  Prox RCA-1 lesion, 20% stenosed. Ost LAD to Prox LAD lesion, 20% stenosed. There is mild left ventricular systolic dysfunction.   Mild LV dysfunction with very subtle minimal mid anterolateral hypocontractility with a global ejection fraction of 45-50%.   No significant residual CAD with 10-20% luminal irregularity of the proximal LAD, and evidence for widely patent LAD stent after this region of irregularity; dominant left circumflex coronary artery with a widely patent stent in the marginal branch; nondominant RCA with widely pain proximal stent with smooth 20% narrowing immediately proximal to the stented segment.   Recommendations: The present study did not demonstrate significant residual CAD, and all stents are widely patent.  Heparin will be resumed 8 hours post sheath removal in light of his atrial fibrillation and troponin elevation of 5.  Past Medical History:  Diagnosis Date   Acute cholecystitis 03/2016   with sepsis   Arthritis    CAD S/P percutaneous coronary angioplasty 1995, 2000, 2005   a. 1995 s/p BMS;  b. 2000 s/p BMS;  c. 2005 s/p stent - All stents in [facility] (RCA, LAD & OM - unknown on which date);  d. 04/2015 NSTEMI/Cath: LM nl, ost/p LAD 20%, patent mLAD stent, RI small, OM2 patent stent, pRCA 20%, patent stent, EF 45-50%-->Med Rx.   Calculus of bile duct with acute cholangitis with obstruction    Chronic pain syndrome    a. Followed @ [facility];  b. Uses 12-14 excedrins per day.   Complication of anesthesia 04/01/2021   agiated- "battling nurses"   Duodenal stricture     Essential hypertension    Fatty liver    Gallstones    GERD (gastroesophageal reflux disease)    History of depression    History of post poliomyelitis muscular atrophy    HLD (hyperlipidemia)    Ischemic cardiomyopathy    a. 04/2015 Echo: EF 40-45%, mod antsept, ant, antlat, apical HK, mild AI/MR, PASP .   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 04/2015 in setting of cholecystits and sepsis -->Amio;  b. CHA2DS2VASc = 4--> Eliquis.   Pneumonia 04/2015   Post-polio syndrome    a. ambulates with braces.   Septic shock (HCC) 01/07/2018   Shoulder pain, right     Past Surgical History:  Procedure Laterality Date   BALLOON DILATION N/A 06/06/2018   Procedure: BALLOON DILATION;  Surgeon: Meridee Score Netty Starring., MD;  Location: [facility] ENDOSCOPY;  Service: Gastroenterology;  Laterality: N/A;   BALLOON DILATION N/A 07/06/2018  Procedure: BALLOON DILATION;  Surgeon: Lemar Lofty., MD;  Location: [facility] ENDOSCOPY;  Service: Gastroenterology;  Laterality: N/A;   BALLOON DILATION N/A 07/24/2021   Procedure: BALLOON DILATION;  Surgeon: Meridee Score Netty Starring., MD;  Location: [facility] ENDOSCOPY;  Service: Gastroenterology;  Laterality: N/A;   BILIARY BRUSHING  06/05/2021   Procedure: BILIARY BRUSHING;  Surgeon: Meridee Score Netty Starring., MD;  Location: [facility] ENDOSCOPY;  Service: Gastroenterology;;   BILIARY DILATION  06/05/2021   Procedure: BILIARY DILATION;  Surgeon: Lemar Lofty., MD;  Location: [facility] ENDOSCOPY;  Service: Gastroenterology;;   BILIARY DILATION  09/15/2021   Procedure: BILIARY DILATION;  Surgeon: Lemar Lofty., MD;  Location: [facility] ENDOSCOPY;  Service: Gastroenterology;;   BILIARY STENT PLACEMENT  01/08/2018   Procedure: BILIARY STENT PLACEMENT;  Surgeon: Lynann Bologna, MD;   Location: [facility] ENDOSCOPY;  Service: Endoscopy;;   BILIARY STENT PLACEMENT N/A 02/09/2018   Procedure: BILIARY STENT PLACEMENT;  Surgeon: Lemar Lofty., MD;  Location: [facility] ENDOSCOPY;  Service: Gastroenterology;  Laterality: N/A;   BILIARY STENT PLACEMENT  04/01/2021   Procedure: BILIARY STENT PLACEMENT;  Surgeon: Lynann Bologna, MD;  Location: [facility] ENDOSCOPY;  Service: Endoscopy;;   BIOPSY  06/06/2018   Procedure: BIOPSY;  Surgeon: Lemar Lofty., MD;  Location: [facility] ENDOSCOPY;  Service: Gastroenterology;;   BIOPSY  04/01/2021   Procedure: BIOPSY;  Surgeon: Lynann Bologna, MD;  Location: [facility] ENDOSCOPY;  Service: Endoscopy;;   BIOPSY  06/05/2021   Procedure: BIOPSY;  Surgeon: Lemar Lofty., MD;  Location: [facility] ENDOSCOPY;  Service: Gastroenterology;;   CARDIAC CATHETERIZATION N/A 04/30/2015   Procedure: Left Heart Cath and Coronary Angiography;  Surgeon: Lennette Bihari, MD;  Location: [facility] INVASIVE CV LAB;  Service: Cardiovascular; LM nl, ost/p LAD 20%, patent mLAD stent, RI small, OM2 patent stent, pRCA 20%, patent stent, EF 45-50%-->Med Rx    cervical spine stimulator  08/2012   CHOLECYSTECTOMY  06/10/2015   Procedure: LAPAROSCOPIC CHOLECYSTECTOMY;  Surgeon: De Blanch Kinsinger, MD;  Location: [facility] OR;  Service: General;;   COLONOSCOPY  09/2013   TA x2, rpt 5 yrs (Pyrtle)   COLONOSCOPY WITH ESOPHAGOGASTRODUODENOSCOPY (EGD)  06/2018   reactive gastropathy, tubular adenoma, rpt colonoscopy 5 yrs (Mansouraty)   COLONOSCOPY WITH PROPOFOL N/A 06/06/2018   Procedure: COLONOSCOPY WITH PROPOFOL;  Surgeon: Lemar Lofty., MD;  Location: [facility] ENDOSCOPY;  Service: Gastroenterology;  Laterality: N/A;   CORONARY ANGIOPLASTY WITH STENT PLACEMENT  1995, 2000, 2005   stents in RCA, LAD & Cx-OM. -Stents were patent by cardiac catheterization in November 2016   ENDOSCOPIC RETROGRADE CHOLANGIOPANCREATOGRAPHY (ERCP) WITH PROPOFOL N/A 02/09/2018   Procedure: ENDOSCOPIC RETROGRADE  CHOLANGIOPANCREATOGRAPHY (ERCP) WITH PROPOFOL;  Surgeon: Lemar Lofty., MD;  Location: [facility] ENDOSCOPY;  Service: Gastroenterology;  Laterality: N/A;   ENDOSCOPIC RETROGRADE CHOLANGIOPANCREATOGRAPHY (ERCP) WITH PROPOFOL N/A 03/30/2018   Procedure: ENDOSCOPIC RETROGRADE CHOLANGIOPANCREATOGRAPHY (ERCP) WITH PROPOFOL;  Surgeon: Meridee Score Netty Starring., MD;  Location: [facility] ENDOSCOPY;  Service: Gastroenterology;  Laterality: N/A;   ENDOSCOPIC RETROGRADE CHOLANGIOPANCREATOGRAPHY (ERCP) WITH PROPOFOL N/A 06/05/2021   Procedure: ENDOSCOPIC RETROGRADE CHOLANGIOPANCREATOGRAPHY (ERCP) WITH PROPOFOL;  Surgeon: Meridee Score Netty Starring., MD;  Location: [facility] ENDOSCOPY;  Service: Gastroenterology;  Laterality: N/A;   ENDOSCOPIC RETROGRADE CHOLANGIOPANCREATOGRAPHY (ERCP) WITH PROPOFOL N/A 09/15/2021   Procedure: ENDOSCOPIC RETROGRADE CHOLANGIOPANCREATOGRAPHY (ERCP) WITH PROPOFOL;  Surgeon: Meridee Score Netty Starring., MD;  Location: [facility] ENDOSCOPY;  Service: Gastroenterology;  Laterality: N/A;   ERCP N/A 01/08/2018   Procedure: ENDOSCOPIC RETROGRADE CHOLANGIOPANCREATOGRAPHY (ERCP);  Surgeon: Lynann Bologna, MD;  Location: [facility] ENDOSCOPY;  Service: Endoscopy;  Laterality:  N/A;   ERCP N/A 04/01/2021   Procedure: ENDOSCOPIC RETROGRADE CHOLANGIOPANCREATOGRAPHY (ERCP);  Surgeon: Lynann Bologna, MD;  Location: [facility] ENDOSCOPY;  Service: Endoscopy;  Laterality: N/A;  Please schedule after 1 PM   ESOPHAGOGASTRODUODENOSCOPY N/A 03/30/2018   Procedure: ESOPHAGOGASTRODUODENOSCOPY (EGD);  Surgeon: Lemar Lofty., MD;  Location: [facility] ENDOSCOPY;  Service: Gastroenterology;  Laterality: N/A;   ESOPHAGOGASTRODUODENOSCOPY (EGD) WITH PROPOFOL N/A 06/06/2018   Procedure: ESOPHAGOGASTRODUODENOSCOPY (EGD) WITH PROPOFOL;  Surgeon: Meridee Score Netty Starring., MD;  Location: [facility] ENDOSCOPY;  Service: Gastroenterology;  Laterality: N/A;   ESOPHAGOGASTRODUODENOSCOPY (EGD) WITH PROPOFOL N/A 07/06/2018   Procedure: ESOPHAGOGASTRODUODENOSCOPY (EGD) WITH  PROPOFOL;  Surgeon: Meridee Score Netty Starring., MD;  Location: [facility] ENDOSCOPY;  Service: Gastroenterology;  Laterality: N/A;   ESOPHAGOGASTRODUODENOSCOPY (EGD) WITH PROPOFOL N/A 07/24/2021   Procedure: ESOPHAGOGASTRODUODENOSCOPY (EGD) WITH PROPOFOL WITH FLUOROSCOPY;  Surgeon: Meridee Score Netty Starring., MD;  Location: [facility] ENDOSCOPY;  Service: Gastroenterology;  Laterality: N/A;   EUS N/A 07/24/2021   Procedure: UPPER ENDOSCOPIC ULTRASOUND (EUS) RADIAL;  Surgeon: Lemar Lofty., MD;  Location: [facility] ENDOSCOPY;  Service: Gastroenterology;  Laterality: N/A;   lumbar spine stimulator     POLYPECTOMY  06/06/2018   Procedure: POLYPECTOMY;  Surgeon: Mansouraty, Netty Starring., MD;  Location: [facility] ENDOSCOPY;  Service: Gastroenterology;;   REMOVAL OF STONES  03/30/2018   Procedure: REMOVAL OF STONES;  Surgeon: Lemar Lofty., MD;  Location: [facility] ENDOSCOPY;  Service: Gastroenterology;;   REMOVAL OF STONES  06/05/2021   Procedure: REMOVAL OF STONES;  Surgeon: Lemar Lofty., MD;  Location: [facility] ENDOSCOPY;  Service: Gastroenterology;;   REMOVAL OF STONES  09/15/2021   Procedure: REMOVAL OF STONES;  Surgeon: Lemar Lofty., MD;  Location: [facility] ENDOSCOPY;  Service: Gastroenterology;;   Dennison Mascot  01/08/2018   Procedure: Dennison Mascot;  Surgeon: Lynann Bologna, MD;  Location: [facility] ENDOSCOPY;  Service: Endoscopy;;   SPHINCTEROTOMY  02/09/2018   Procedure: Dennison Mascot;  Surgeon: Mansouraty, Netty Starring., MD;  Location: [facility] ENDOSCOPY;  Service: Gastroenterology;;   [facility] CHOLANGIOSCOPY N/A 06/05/2021   Procedure: ZOXWRUEA CHOLANGIOSCOPY;  Surgeon: Lemar Lofty., MD;  Location: [facility] ENDOSCOPY;  Service: Gastroenterology;  Laterality: N/A;   STENT REMOVAL  02/09/2018   Procedure: STENT REMOVAL;  Surgeon: Lemar Lofty., MD;  Location: [facility] ENDOSCOPY;  Service: Gastroenterology;;   Francine Graven REMOVAL  03/30/2018   Procedure: STENT REMOVAL;  Surgeon: Lemar Lofty., MD;   Location: [facility] ENDOSCOPY;  Service: Gastroenterology;;   Francine Graven REMOVAL  06/05/2021   Procedure: STENT REMOVAL;  Surgeon: Lemar Lofty., MD;  Location: [facility] ENDOSCOPY;  Service: Gastroenterology;;   TRANSTHORACIC ECHOCARDIOGRAM  04/2015   EF 40-45%, mod antsept, ant, antlat, apical HK, mild AI/MR, PASP .   TRANSTHORACIC ECHOCARDIOGRAM  08/23/2020   EF 55 to 60%.  No RWMA.  Mild LVH.  GR 1 DD.  Normal RV size and function with normal RVP.  Normal mitral and aortic valve.   UVULOPALATOPHARYNGOPLASTY, TONSILLECTOMY AND SEPTOPLASTY  2002    MEDICATIONS: No current facility-administered medications for this encounter.    acetaminophen (TYLENOL) 500 MG tablet   Ascorbic Acid (VITAMIN C) 1000 MG tablet   Carboxymethylcellulose Sodium (LUBRICANT EYE DROPS OP)   Cholecalciferol (VITAMIN D3) 50 MCG (2000 UT) TABS   gabapentin (NEURONTIN) 600 MG tablet   ibuprofen (ADVIL) 200 MG tablet   Magnesium 250 MG TABS   mirabegron ER (MYRBETRIQ) 25 MG TB24 tablet   Omega-3 Fatty Acids (FISH OIL) 1000 MG CAPS   omeprazole (PRILOSEC) 40 MG capsule   rosuvastatin (CRESTOR) 40 MG tablet  tamsulosin (FLOMAX) 0.4 MG CAPS capsule   vitamin B-12 (CYANOCOBALAMIN) 1000 MCG tablet   vitamin E 400 UNIT capsule   sildenafil (VIAGRA) 100 MG tablet   Marcille Blanco [facility] Surgical Short Stay/Anesthesiology [facility] Phone [phone] 10/28/2022 11:22 AM

## 2022-10-28 NOTE — Anesthesia Preprocedure Evaluation (Signed)
Anesthesia Evaluation  Patient identified by MRN, date of birth, ID band Patient awake    Reviewed: Allergy & Precautions, NPO status , Patient's Chart, lab work & pertinent test results  Airway Mallampati: II  TM Distance: >3 FB Neck ROM: Full    Dental  (+) Dental Advisory Given   Pulmonary sleep apnea , former smoker   breath sounds clear to auscultation       Cardiovascular hypertension, Pt. on medications + CAD and + Cardiac Stents  + dysrhythmias  Rhythm:Regular Rate:Normal     Neuro/Psych  Headaches  Neuromuscular disease (Post polio syndrome)    GI/Hepatic Neg liver ROS,GERD  ,,  Endo/Other  negative endocrine ROS    Renal/GU negative Renal ROS     Musculoskeletal  (+) Arthritis ,    Abdominal   Peds  Hematology negative hematology ROS (+)   Anesthesia Other Findings   Reproductive/Obstetrics                             Anesthesia Physical Anesthesia Plan  ASA: 3  Anesthesia Plan: General   Post-op Pain Management: Tylenol PO (pre-op)*   Induction: Intravenous  PONV Risk Score and Plan: 2 and Dexamethasone, Ondansetron and Treatment may vary due to age or medical condition  Airway Management Planned: Oral ETT  Additional Equipment: None  Intra-op Plan:   Post-operative Plan: Extubation in OR  Informed Consent: I have reviewed the patients History and Physical, chart, labs and discussed the procedure including the risks, benefits and alternatives for the proposed anesthesia with the patient or authorized representative who has indicated his/her understanding and acceptance.     Dental advisory given  Plan Discussed with:   Anesthesia Plan Comments: ( )        Anesthesia Quick Evaluation

## 2022-10-29 ENCOUNTER — Ambulatory Visit (HOSPITAL_COMMUNITY): Payer: Medicare Other

## 2022-10-29 ENCOUNTER — Ambulatory Visit (HOSPITAL_COMMUNITY)
Admission: RE | Admit: 2022-10-29 | Discharge: 2022-10-29 | Disposition: A | Discharge status: Home / Self Care | Payer: Medicare Other | Source: Ambulatory Visit | Attending: Student | Admitting: Student

## 2022-10-29 ENCOUNTER — Encounter (HOSPITAL_COMMUNITY): Payer: Self-pay | Admitting: Student

## 2022-10-29 ENCOUNTER — Ambulatory Visit (HOSPITAL_BASED_OUTPATIENT_CLINIC_OR_DEPARTMENT_OTHER): Payer: Medicare Other | Admitting: Medical

## 2022-10-29 ENCOUNTER — Other Ambulatory Visit: Payer: Self-pay | Admitting: Gastroenterology

## 2022-10-29 ENCOUNTER — Ambulatory Visit (HOSPITAL_COMMUNITY): Payer: Medicare Other | Admitting: Medical

## 2022-10-29 ENCOUNTER — Encounter (HOSPITAL_COMMUNITY)
Admission: RE | Disposition: A | Discharge status: Home / Self Care | Payer: Self-pay | Source: Ambulatory Visit | Attending: Student

## 2022-10-29 ENCOUNTER — Other Ambulatory Visit: Payer: Self-pay

## 2022-10-29 DIAGNOSIS — J849 Interstitial pulmonary disease, unspecified: Secondary | ICD-10-CM

## 2022-10-29 DIAGNOSIS — I2510 Atherosclerotic heart disease of native coronary artery without angina pectoris: Secondary | ICD-10-CM

## 2022-10-29 DIAGNOSIS — I480 Paroxysmal atrial fibrillation: Secondary | ICD-10-CM | POA: Diagnosis not present

## 2022-10-29 DIAGNOSIS — R918 Other nonspecific abnormal finding of lung field: Secondary | ICD-10-CM | POA: Diagnosis not present

## 2022-10-29 DIAGNOSIS — G4733 Obstructive sleep apnea (adult) (pediatric): Secondary | ICD-10-CM | POA: Diagnosis not present

## 2022-10-29 DIAGNOSIS — I10 Essential (primary) hypertension: Secondary | ICD-10-CM | POA: Diagnosis not present

## 2022-10-29 DIAGNOSIS — M1990 Unspecified osteoarthritis, unspecified site: Secondary | ICD-10-CM | POA: Diagnosis not present

## 2022-10-29 DIAGNOSIS — Z01818 Encounter for other preprocedural examination: Secondary | ICD-10-CM

## 2022-10-29 DIAGNOSIS — K219 Gastro-esophageal reflux disease without esophagitis: Secondary | ICD-10-CM | POA: Insufficient documentation

## 2022-10-29 DIAGNOSIS — I255 Ischemic cardiomyopathy: Secondary | ICD-10-CM | POA: Insufficient documentation

## 2022-10-29 DIAGNOSIS — Z955 Presence of coronary angioplasty implant and graft: Secondary | ICD-10-CM | POA: Diagnosis not present

## 2022-10-29 DIAGNOSIS — G14 Postpolio syndrome: Secondary | ICD-10-CM | POA: Diagnosis not present

## 2022-10-29 DIAGNOSIS — I700 Atherosclerosis of aorta: Secondary | ICD-10-CM | POA: Diagnosis not present

## 2022-10-29 DIAGNOSIS — J9610 Chronic respiratory failure, unspecified whether with hypoxia or hypercapnia: Secondary | ICD-10-CM | POA: Diagnosis not present

## 2022-10-29 DIAGNOSIS — Z87891 Personal history of nicotine dependence: Secondary | ICD-10-CM | POA: Diagnosis not present

## 2022-10-29 DIAGNOSIS — R849 Unspecified abnormal finding in specimens from respiratory organs and thorax: Secondary | ICD-10-CM | POA: Diagnosis not present

## 2022-10-29 HISTORY — PX: BRONCHIAL BIOPSY: SHX5109

## 2022-10-29 HISTORY — PX: VIDEO BRONCHOSCOPY: SHX5072

## 2022-10-29 HISTORY — PX: BRONCHIAL WASHINGS: SHX5105

## 2022-10-29 LAB — BODY FLUID CELL COUNT WITH DIFFERENTIAL
Eos, Fluid: 11 %
Lymphs, Fluid: 18 %
Monocyte-Macrophage-Serous Fluid: 48 % — ABNORMAL LOW (ref 50–90)
Neutrophil Count, Fluid: 23 % (ref 0–25)
Total Nucleated Cell Count, Fluid: 59 cu mm (ref 0–1000)

## 2022-10-29 LAB — CBC
HCT: 46.5 % (ref 39.0–52.0)
Hemoglobin: 15.7 g/dL (ref 13.0–17.0)
MCH: 33 pg (ref 26.0–34.0)
MCHC: 33.8 g/dL (ref 30.0–36.0)
MCV: 97.7 fL (ref 80.0–100.0)
Platelets: 214 10*3/uL (ref 150–400)
RBC: 4.76 MIL/uL (ref 4.22–5.81)
RDW: 13 % (ref 11.5–15.5)
WBC: 6.4 10*3/uL (ref 4.0–10.5)
nRBC: 0 % (ref 0.0–0.2)

## 2022-10-29 LAB — POCT I-STAT, CHEM 8
BUN: 18 mg/dL (ref 8–23)
Calcium, Ion: 1.12 mmol/L — ABNORMAL LOW (ref 1.15–1.40)
Chloride: 106 mmol/L (ref 98–111)
Creatinine, Ser: 0.5 mg/dL — ABNORMAL LOW (ref 0.61–1.24)
Glucose, Bld: 107 mg/dL — ABNORMAL HIGH (ref 70–99)
HCT: 45 % (ref 39.0–52.0)
Hemoglobin: 15.3 g/dL (ref 13.0–17.0)
Potassium: 3.8 mmol/L (ref 3.5–5.1)
Sodium: 140 mmol/L (ref 135–145)
TCO2: 26 mmol/L (ref 22–32)

## 2022-10-29 LAB — CULTURE, BAL-QUANTITATIVE W GRAM STAIN

## 2022-10-29 LAB — NOVEL CORONAVIRUS, NAA: SARS-CoV-2, NAA: NOT DETECTED

## 2022-10-29 SURGERY — BRONCHOSCOPY, WITH FLUOROSCOPY
Anesthesia: General

## 2022-10-29 MED ORDER — DEXMEDETOMIDINE HCL IN NACL 80 MCG/20ML IV SOLN
INTRAVENOUS | Status: DC | PRN
  Administered 2022-10-29: 8 ug via INTRAVENOUS

## 2022-10-29 MED ORDER — DEXAMETHASONE SODIUM PHOSPHATE 10 MG/ML IJ SOLN
INTRAMUSCULAR | Status: DC | PRN
  Administered 2022-10-29: 10 mg via INTRAVENOUS

## 2022-10-29 MED ORDER — ACETAMINOPHEN 500 MG PO TABS
ORAL_TABLET | ORAL | Status: AC
  Administered 2022-10-29: 1000 mg
  Filled 2022-10-29: qty 2

## 2022-10-29 MED ORDER — FENTANYL CITRATE (PF) 100 MCG/2ML IJ SOLN
INTRAMUSCULAR | Status: DC | PRN
  Administered 2022-10-29: 50 ug via INTRAVENOUS

## 2022-10-29 MED ORDER — ROCURONIUM BROMIDE 10 MG/ML (PF) SYRINGE
PREFILLED_SYRINGE | INTRAVENOUS | Status: DC | PRN
  Administered 2022-10-29: 60 mg via INTRAVENOUS

## 2022-10-29 MED ORDER — PROPOFOL 10 MG/ML IV BOLUS
INTRAVENOUS | Status: DC | PRN
  Administered 2022-10-29: 200 mg via INTRAVENOUS

## 2022-10-29 MED ORDER — SUGAMMADEX SODIUM 200 MG/2ML IV SOLN
INTRAVENOUS | Status: DC | PRN
  Administered 2022-10-29 (×2): 200 mg via INTRAVENOUS

## 2022-10-29 MED ORDER — LACTATED RINGERS IV SOLN: INTRAVENOUS | Status: DC

## 2022-10-29 MED ORDER — CHLORHEXIDINE GLUCONATE 0.12 % MT SOLN
15.0000 mL | Freq: Once | OROMUCOSAL | Status: AC
  Administered 2022-10-29: 15 mL via OROMUCOSAL
  Filled 2022-10-29 (×2): qty 15

## 2022-10-29 MED ORDER — LIDOCAINE 2% (20 MG/ML) 5 ML SYRINGE
INTRAMUSCULAR | Status: DC | PRN
  Administered 2022-10-29: 60 mg via INTRAVENOUS

## 2022-10-29 MED ORDER — ONDANSETRON HCL 4 MG/2ML IJ SOLN
INTRAMUSCULAR | Status: DC | PRN
  Administered 2022-10-29: 4 mg via INTRAVENOUS

## 2022-10-29 MED ORDER — AMISULPRIDE (ANTIEMETIC) 5 MG/2ML IV SOLN: 10.0000 mg | Freq: Once | INTRAVENOUS | Status: DC | PRN

## 2022-10-29 MED ORDER — FENTANYL CITRATE (PF) 100 MCG/2ML IJ SOLN: 25.0000 ug | INTRAMUSCULAR | Status: DC | PRN

## 2022-10-29 MED ORDER — PHENYLEPHRINE 80 MCG/ML (10ML) SYRINGE FOR IV PUSH (FOR BLOOD PRESSURE SUPPORT)
PREFILLED_SYRINGE | INTRAVENOUS | Status: DC | PRN
  Administered 2022-10-29 (×2): 80 ug via INTRAVENOUS

## 2022-10-29 MED ORDER — PROPOFOL 500 MG/50ML IV EMUL
INTRAVENOUS | Status: DC | PRN
  Administered 2022-10-29: 100 ug/kg/min via INTRAVENOUS

## 2022-10-29 NOTE — Interval H&P Note (Signed)
History and Physical Interval Note:  10/29/2022 8:47 AM  Alexander [name]  has presented today for surgery, with the diagnosis of INTERSTITIAL LUNG DISEASE.  The various methods of treatment have been discussed with the patient and family. After consideration of risks, benefits and other options for treatment, the patient has consented to  Procedure(s): VIDEO BRONCHOSCOPY WITH FLUORO (N/A) as a surgical intervention.  [name] history has been reviewed, patient examined, no change in status, stable for surgery.  I have reviewed the patient's chart and labs.  Questions were answered to the patient's satisfaction.     [name]

## 2022-10-29 NOTE — Anesthesia Postprocedure Evaluation (Signed)
Anesthesia Post Note  Patient: Alexander [name]  Procedure(s) Performed: VIDEO BRONCHOSCOPY WITH FLUORO BRONCHIAL BIOPSIES BRONCHIAL WASHINGS     Patient location during evaluation: PACU Anesthesia Type: General Level of consciousness: awake and alert Pain management: pain level controlled Vital Signs Assessment: post-procedure vital signs reviewed and stable Respiratory status: spontaneous breathing, nonlabored ventilation, respiratory function stable and patient connected to nasal cannula oxygen Cardiovascular status: blood pressure returned to baseline and stable Postop Assessment: no apparent nausea or vomiting Anesthetic complications: no  No notable events documented.  Last Vitals:  Vitals:   10/29/22 1015 10/29/22 1030  BP: 124/85 111/82  Pulse: 74 81  Resp: 20 18  Temp:  (!) 36.1 C  SpO2: 95% 94%    Last Pain:  Vitals:   10/29/22 1030  TempSrc:   PainSc: 0-No pain                 Kennieth Rad

## 2022-10-29 NOTE — Op Note (Signed)
Bronchoscopy Procedure Note  Alexander [name]  [id number]  1951-02-04  Date:10/29/22  Time:9:53 AM   Provider Performing:Karanveer Ramakrishnan M Thora Lance   Procedure(s):  Flexible bronchoscopy with bronchial alveolar lavage [phone]), Transbronchial lung biopsy, single lobe ([id number]), and Transbronchial lung biopsy, additional lobe ([id number])  Indication(s) ILD  Consent Risks of the procedure as well as the alternatives and risks of each were explained to the patient and/or caregiver.  Consent for the procedure was obtained and is signed in the bedside chart  Anesthesia General   Time Out Verified patient identification, verified procedure, site/side was marked, verified correct patient position, special equipment/implants available, medications/allergies/relevant history reviewed, required imaging and test results available.   Sterile Technique Usual hand hygiene, masks, gowns, and gloves were used   Procedure Description Bronchoscope advanced through endotracheal tube and into airway.  Airways were examined down to subsegmental level with findings noted below.     Findings:  - scattered airway pitting - BAL performed in RML with 100cc saline instilled and 30cc return - TBLBx in RLL with 6 passes (3 for envisia), RUL with 6 passes (2 for envisia)   Complications/Tolerance None; patient tolerated the procedure well. Chest X-ray is needed post procedure.   EBL Minimal   Specimen(s) BAL sent for cyto, micro, cell count/diff TBLBx sent for path and envisia

## 2022-10-29 NOTE — Anesthesia Procedure Notes (Signed)
Procedure Name: Intubation Date/Time: 10/29/2022 9:22 AM  Performed by: Waynard Edwards, CRNAPre-anesthesia Checklist: Patient identified, Emergency Drugs available, Suction available and Patient being monitored Patient Re-evaluated:Patient Re-evaluated prior to induction Oxygen Delivery Method: Circle system utilized Preoxygenation: Pre-oxygenation with 100% oxygen Induction Type: IV induction Ventilation: Mask ventilation without difficulty Laryngoscope Size: 3 and Miller Grade View: Grade I Tube type: Oral Tube size: 8.5 mm Number of attempts: 1 Airway Equipment and Method: Stylet Placement Confirmation: ETT inserted through vocal cords under direct vision, positive ETCO2 and breath sounds checked- equal and bilateral Secured at: 23 cm Tube secured with: Tape Dental Injury: Teeth and Oropharynx as per pre-operative assessment

## 2022-10-29 NOTE — Discharge Instructions (Signed)
-   Normal to have cough with small blood clots, bloody streaks for first 1-3 days after bronchoscopy. If coughing up clot size of your thumb or larger then call our office [phone] and if persistent, especially if you're also developing worsening shortness of breath then would plan to head to ED. - Fever is common in the first 1-2 days after bronchoscopy and often is simply your body's reaction to saline exposure in your lung - just take tylenol. - Will call you in 3-5 business days when some results become available

## 2022-10-29 NOTE — Transfer of Care (Signed)
Immediate Anesthesia Transfer of Care Note  Patient: Alexander [name]  Procedure(s) Performed: VIDEO BRONCHOSCOPY WITH FLUORO BRONCHIAL BIOPSIES BRONCHIAL WASHINGS  Patient Location: PACU  Anesthesia Type:General  Level of Consciousness: drowsy  Airway & Oxygen Therapy: Patient Spontanous Breathing and Patient connected to face mask oxygen  Post-op Assessment: Report given to RN and Post -op Vital signs reviewed and stable  Post vital signs: Reviewed and stable  Last Vitals:  Vitals Value Taken Time  BP 123/91 10/29/22 1011  Temp 36.1 C 10/29/22 1010  Pulse 73 10/29/22 1013  Resp 17 10/29/22 1013  SpO2 95 % 10/29/22 1013  Vitals shown include unvalidated device data.  Last Pain:  Vitals:   10/29/22 0636  TempSrc: Oral  PainSc: 0-No pain         Complications: No notable events documented.

## 2022-10-30 LAB — CYTOLOGY - NON PAP

## 2022-10-30 LAB — CULTURE, BAL-QUANTITATIVE W GRAM STAIN

## 2022-10-30 LAB — AEROBIC/ANAEROBIC CULTURE W GRAM STAIN (SURGICAL/DEEP WOUND)

## 2022-10-30 LAB — SURGICAL PATHOLOGY

## 2022-10-31 ENCOUNTER — Encounter: Payer: Self-pay | Admitting: Family Medicine

## 2022-10-31 ENCOUNTER — Other Ambulatory Visit: Payer: Self-pay | Admitting: Family Medicine

## 2022-10-31 ENCOUNTER — Other Ambulatory Visit: Payer: Self-pay | Admitting: Internal Medicine

## 2022-10-31 LAB — CULTURE, BAL-QUANTITATIVE W GRAM STAIN: Culture: NO GROWTH

## 2022-10-31 LAB — AEROBIC/ANAEROBIC CULTURE W GRAM STAIN (SURGICAL/DEEP WOUND)

## 2022-10-31 LAB — ACID FAST SMEAR (AFB, MYCOBACTERIA): Acid Fast Smear: NEGATIVE

## 2022-11-01 ENCOUNTER — Other Ambulatory Visit: Payer: Self-pay | Admitting: Family Medicine

## 2022-11-01 DIAGNOSIS — N401 Enlarged prostate with lower urinary tract symptoms: Secondary | ICD-10-CM

## 2022-11-01 DIAGNOSIS — E785 Hyperlipidemia, unspecified: Secondary | ICD-10-CM

## 2022-11-01 DIAGNOSIS — E559 Vitamin D deficiency, unspecified: Secondary | ICD-10-CM

## 2022-11-01 DIAGNOSIS — R7303 Prediabetes: Secondary | ICD-10-CM

## 2022-11-01 DIAGNOSIS — I480 Paroxysmal atrial fibrillation: Secondary | ICD-10-CM

## 2022-11-02 ENCOUNTER — Encounter (HOSPITAL_COMMUNITY): Payer: Self-pay | Admitting: Student

## 2022-11-02 LAB — AEROBIC/ANAEROBIC CULTURE W GRAM STAIN (SURGICAL/DEEP WOUND)

## 2022-11-03 LAB — FUNGUS CULTURE WITH STAIN

## 2022-11-03 LAB — AEROBIC/ANAEROBIC CULTURE W GRAM STAIN (SURGICAL/DEEP WOUND): Culture: NO GROWTH

## 2022-11-03 LAB — FUNGUS CULTURE RESULT

## 2022-11-04 ENCOUNTER — Other Ambulatory Visit (INDEPENDENT_AMBULATORY_CARE_PROVIDER_SITE_OTHER): Payer: Medicare Other

## 2022-11-04 DIAGNOSIS — N401 Enlarged prostate with lower urinary tract symptoms: Secondary | ICD-10-CM | POA: Diagnosis not present

## 2022-11-04 DIAGNOSIS — E785 Hyperlipidemia, unspecified: Secondary | ICD-10-CM | POA: Diagnosis not present

## 2022-11-04 DIAGNOSIS — R3912 Poor urinary stream: Secondary | ICD-10-CM

## 2022-11-04 DIAGNOSIS — E559 Vitamin D deficiency, unspecified: Secondary | ICD-10-CM | POA: Diagnosis not present

## 2022-11-04 DIAGNOSIS — R7303 Prediabetes: Secondary | ICD-10-CM

## 2022-11-04 LAB — COMPREHENSIVE METABOLIC PANEL
ALT: 17 U/L (ref 0–53)
AST: 15 U/L (ref 0–37)
Albumin: 4.3 g/dL (ref 3.5–5.2)
Alkaline Phosphatase: 51 U/L (ref 39–117)
BUN: 19 mg/dL (ref 6–23)
CO2: 25 mEq/L (ref 19–32)
Calcium: 9.2 mg/dL (ref 8.4–10.5)
Chloride: 103 mEq/L (ref 96–112)
Creatinine, Ser: 0.6 mg/dL (ref 0.40–1.50)
GFR: 96.33 mL/min (ref >60.00)
Glucose, Bld: 108 mg/dL — ABNORMAL HIGH (ref 70–99)
Potassium: 4.2 mEq/L (ref 3.5–5.1)
Sodium: 140 mEq/L (ref 135–145)
Total Bilirubin: 0.5 mg/dL (ref 0.2–1.2)
Total Protein: 7.4 g/dL (ref 6.0–8.3)

## 2022-11-04 LAB — VITAMIN D 25 HYDROXY (VIT D DEFICIENCY, FRACTURES): VITD: 41.79 ng/mL (ref 30.00–100.00)

## 2022-11-04 LAB — PSA: PSA: 2.35 ng/mL (ref 0.10–4.00)

## 2022-11-04 LAB — LIPID PANEL
Cholesterol: 107 mg/dL (ref 0–200)
HDL: 45 mg/dL (ref >39.00)
LDL Cholesterol: 43 mg/dL (ref 0–99)
NonHDL: 62.48
Total CHOL/HDL Ratio: 2
Triglycerides: 97 mg/dL (ref 0.0–149.0)
VLDL: 19.4 mg/dL (ref 0.0–40.0)

## 2022-11-04 LAB — HEMOGLOBIN A1C: Hgb A1c MFr Bld: 5.9 % (ref 4.6–6.5)

## 2022-11-10 ENCOUNTER — Ambulatory Visit (INDEPENDENT_AMBULATORY_CARE_PROVIDER_SITE_OTHER): Payer: Medicare Other

## 2022-11-10 VITALS — Ht 70.0 in | Wt 205.0 lb

## 2022-11-10 DIAGNOSIS — Z0000 Encounter for general adult medical examination without abnormal findings: Secondary | ICD-10-CM | POA: Diagnosis not present

## 2022-11-10 NOTE — Progress Notes (Signed)
Synopsis: Referred for chronic cough, post covid issues by Eustaquio Boyden, MD  Subjective:   PATIENT ID: Caprice Kluver GENDER: male DOB: [birth year], MRN: [id number]  Chief Complaint  Patient presents with   Follow-up    Breathing is unchanged. He has a non prod cough.    CC: dyspnea, cough  72yM with history of CAD, HTN, ICM, OSA not on CPAP, pAF, very light smoking in past. Had covid-19 a year ago in January was not hospitalized with it.   Coughing, short of breath, little energy over last 4 months. Cough is dry occasionally productive of sputum but he just swallows it. He does have sinus congestion and postnasal drainage. He tried flonase on and off since November. Benadryl here and there. He is on omeprazole BID for reflux. No solid/liquid food dysphagia.   Got steroid course in October/Nov, steroids did not help. Had another round of steroids/antibiotic which did not help.   Had polio as a kid and started to need braces/crutches in 2002.   Some morning stiffness but takes 10-15 min to loosen up, no rashes, raynaud's phenomenon, ulcers in mouth or nose.   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 Some exposure to cleaning agents in past, some solvents. No MJ, vaping. Born in [place], lived for 40y in [place] and then was in [place] in [place], moved to [place]. 3 dogs. No hot tub.   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 BAL diff with 18% lymphs, 11% eos.   Gabapentin, ibuprofen with case report of eosinophilic pna but not this pattern Rosuvastatin with ILD but not this pattern  Overall still with a lack of energy. No change in DOE, cough. Has not yet set up appointment to get autoPAP, playing phone tag with [employer].   Otherwise pertinent review of systems is negative.   Past Medical History:  Diagnosis Date   Acute cholecystitis 03/2016   with sepsis   Arthritis    CAD S/P percutaneous coronary angioplasty 1995, 2000, 2005   a. 1995 s/p BMS;  b. 2000 s/p BMS;  c. 2005 s/p stent - All stents in [facility] (RCA, LAD & OM - unknown on which date);  d. 04/2015 NSTEMI/Cath: LM nl, ost/p LAD 20%, patent mLAD stent, RI small, OM2 patent stent, pRCA 20%, patent stent, EF 45-50%-->Med Rx.   Calculus of bile duct with acute cholangitis with obstruction    Chronic pain syndrome    a. Followed @ [facility];  b. Uses 12-14 excedrins per day.   Complication of anesthesia 04/01/2021   agiated- "battling nurses"   Duodenal stricture    Essential hypertension    Fatty liver    Gallstones    GERD (gastroesophageal reflux disease)    History of depression    History of post poliomyelitis muscular atrophy    HLD (hyperlipidemia)    Ischemic cardiomyopathy  a. 04/2015 Echo: EF 40-45%, mod antsept, ant, antlat, apical HK, mild AI/MR, PASP .   Migraines    Muscle weakness following poliomyelitis    a. ambulates with braces.   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 04/2015 in setting of cholecystits and sepsis -->Amio;  b. CHA2DS2VASc = 4--> Eliquis.   Pneumonia 04/2015   Septic shock (HCC) 01/07/2018   Shoulder pain, right      Family History  Problem Relation Age of  Onset   Diabetes Mother    Heart disease Mother    Cancer Sister 55       male cancer   Breast cancer Brother 86   Hypertension Maternal Aunt    Cancer Maternal Uncle        spinal   Coronary artery disease Neg Hx    Stroke Neg Hx    Colon cancer Neg Hx    Pancreatic cancer Neg Hx    Stomach cancer Neg Hx    Esophageal cancer Neg Hx    Inflammatory bowel disease Neg Hx    Liver disease Neg Hx    Rectal cancer Neg Hx      Past Surgical History:  Procedure Laterality Date   BALLOON DILATION N/A 06/06/2018   Procedure: BALLOON DILATION;  Surgeon: Mansouraty, Netty Starring., MD;  Location: [facility] ENDOSCOPY;  Service: Gastroenterology;  Laterality: N/A;   BALLOON DILATION N/A 07/06/2018   Procedure: BALLOON DILATION;  Surgeon: Meridee Score Netty Starring., MD;  Location: [facility] ENDOSCOPY;  Service: Gastroenterology;  Laterality: N/A;   BALLOON DILATION N/A 07/24/2021   Procedure: BALLOON DILATION;  Surgeon: Meridee Score Netty Starring., MD;  Location: [facility] ENDOSCOPY;  Service: Gastroenterology;  Laterality: N/A;   BILIARY BRUSHING  06/05/2021   Procedure: BILIARY BRUSHING;  Surgeon: Meridee Score Netty Starring., MD;  Location: [facility] ENDOSCOPY;  Service: Gastroenterology;;   BILIARY DILATION  06/05/2021   Procedure: BILIARY DILATION;  Surgeon: Lemar Lofty., MD;  Location: [facility] ENDOSCOPY;  Service: Gastroenterology;;   BILIARY DILATION  09/15/2021   Procedure: BILIARY DILATION;  Surgeon: Lemar Lofty., MD;  Location: [facility] ENDOSCOPY;  Service: Gastroenterology;;   BILIARY STENT PLACEMENT  01/08/2018   Procedure: BILIARY STENT PLACEMENT;  Surgeon: Lynann Bologna, MD;  Location: [facility] ENDOSCOPY;  Service: Endoscopy;;   BILIARY STENT PLACEMENT N/A 02/09/2018   Procedure: BILIARY STENT PLACEMENT;  Surgeon: Lemar Lofty., MD;  Location: [facility] ENDOSCOPY;  Service: Gastroenterology;  Laterality: N/A;   BILIARY STENT PLACEMENT  04/01/2021   Procedure: BILIARY STENT PLACEMENT;  Surgeon: Lynann Bologna,  MD;  Location: [facility] ENDOSCOPY;  Service: Endoscopy;;   BIOPSY  06/06/2018   Procedure: BIOPSY;  Surgeon: Lemar Lofty., MD;  Location: [facility] ENDOSCOPY;  Service: Gastroenterology;;   BIOPSY  04/01/2021   Procedure: BIOPSY;  Surgeon: Lynann Bologna, MD;  Location: [facility] ENDOSCOPY;  Service: Endoscopy;;   BIOPSY  06/05/2021   Procedure: BIOPSY;  Surgeon: Lemar Lofty., MD;  Location: [facility] ENDOSCOPY;  Service: Gastroenterology;;   BRONCHIAL BIOPSY  10/29/2022   Procedure: BRONCHIAL BIOPSIES;  Surgeon: [name], MD;  Location: [facility] ENDOSCOPY;  Service: Pulmonary;;   BRONCHIAL WASHINGS  10/29/2022   Procedure: BRONCHIAL WASHINGS;  Surgeon: [name], MD;  Location: [facility] ENDOSCOPY;  Service: Pulmonary;;   BRONCHOSCOPY  09/2022  benign biopsies, negative for eosinophils, granuloma, fungal ([place]ie [place])   CARDIAC CATHETERIZATION N/A 04/30/2015   Procedure: Left Heart Cath and Coronary Angiography;  Surgeon: Lennette Bihari, MD;  Location: [facility] INVASIVE CV LAB;  Service: Cardiovascular; LM nl, ost/p LAD 20%, patent mLAD stent, RI small, OM2 patent stent, pRCA 20%, patent stent, EF 45-50%-->Med Rx    cervical spine stimulator  08/2012   CHOLECYSTECTOMY  06/10/2015   Procedure: LAPAROSCOPIC CHOLECYSTECTOMY;  Surgeon: De Blanch Kinsinger, MD;  Location: [facility] OR;  Service: General;;   COLONOSCOPY  09/2013   TA x2, rpt 5 yrs (Pyrtle)   COLONOSCOPY WITH ESOPHAGOGASTRODUODENOSCOPY (EGD)  06/2018   reactive gastropathy, tubular adenoma, rpt colonoscopy 5 yrs (Mansouraty)   COLONOSCOPY WITH PROPOFOL N/A 06/06/2018   Procedure: COLONOSCOPY WITH PROPOFOL;  Surgeon: Lemar Lofty., MD;  Location: [facility] ENDOSCOPY;  Service: Gastroenterology;  Laterality: N/A;   CORONARY ANGIOPLASTY WITH STENT PLACEMENT  1995, 2000, 2005   stents in RCA, LAD & Cx-OM. -Stents were patent by cardiac catheterization in November 2016   ENDOSCOPIC RETROGRADE CHOLANGIOPANCREATOGRAPHY (ERCP) WITH PROPOFOL N/A  02/09/2018   Procedure: ENDOSCOPIC RETROGRADE CHOLANGIOPANCREATOGRAPHY (ERCP) WITH PROPOFOL;  Surgeon: Lemar Lofty., MD;  Location: [facility] ENDOSCOPY;  Service: Gastroenterology;  Laterality: N/A;   ENDOSCOPIC RETROGRADE CHOLANGIOPANCREATOGRAPHY (ERCP) WITH PROPOFOL N/A 03/30/2018   Procedure: ENDOSCOPIC RETROGRADE CHOLANGIOPANCREATOGRAPHY (ERCP) WITH PROPOFOL;  Surgeon: Meridee Score Netty Starring., MD;  Location: [facility] ENDOSCOPY;  Service: Gastroenterology;  Laterality: N/A;   ENDOSCOPIC RETROGRADE CHOLANGIOPANCREATOGRAPHY (ERCP) WITH PROPOFOL N/A 06/05/2021   Procedure: ENDOSCOPIC RETROGRADE CHOLANGIOPANCREATOGRAPHY (ERCP) WITH PROPOFOL;  Surgeon: Meridee Score Netty Starring., MD;  Location: [facility] ENDOSCOPY;  Service: Gastroenterology;  Laterality: N/A;   ENDOSCOPIC RETROGRADE CHOLANGIOPANCREATOGRAPHY (ERCP) WITH PROPOFOL N/A 09/15/2021   Procedure: ENDOSCOPIC RETROGRADE CHOLANGIOPANCREATOGRAPHY (ERCP) WITH PROPOFOL;  Surgeon: Meridee Score Netty Starring., MD;  Location: [facility] ENDOSCOPY;  Service: Gastroenterology;  Laterality: N/A;   ERCP N/A 01/08/2018   Procedure: ENDOSCOPIC RETROGRADE CHOLANGIOPANCREATOGRAPHY (ERCP);  Surgeon: Lynann Bologna, MD;  Location: [facility] ENDOSCOPY;  Service: Endoscopy;  Laterality: N/A;   ERCP N/A 04/01/2021   Procedure: ENDOSCOPIC RETROGRADE CHOLANGIOPANCREATOGRAPHY (ERCP);  Surgeon: Lynann Bologna, MD;  Location: [facility] ENDOSCOPY;  Service: Endoscopy;  Laterality: N/A;  Please schedule after 1 PM   ESOPHAGOGASTRODUODENOSCOPY N/A 03/30/2018   Procedure: ESOPHAGOGASTRODUODENOSCOPY (EGD);  Surgeon: Lemar Lofty., MD;  Location: [facility] ENDOSCOPY;  Service: Gastroenterology;  Laterality: N/A;   ESOPHAGOGASTRODUODENOSCOPY (EGD) WITH PROPOFOL N/A 06/06/2018   Procedure: ESOPHAGOGASTRODUODENOSCOPY (EGD) WITH PROPOFOL;  Surgeon: Meridee Score Netty Starring., MD;  Location: [facility] ENDOSCOPY;  Service: Gastroenterology;  Laterality: N/A;   ESOPHAGOGASTRODUODENOSCOPY (EGD) WITH PROPOFOL N/A 07/06/2018    Procedure: ESOPHAGOGASTRODUODENOSCOPY (EGD) WITH PROPOFOL;  Surgeon: Meridee Score Netty Starring., MD;  Location: [facility] ENDOSCOPY;  Service: Gastroenterology;  Laterality: N/A;   ESOPHAGOGASTRODUODENOSCOPY (EGD) WITH PROPOFOL N/A 07/24/2021   Procedure: ESOPHAGOGASTRODUODENOSCOPY (EGD) WITH PROPOFOL WITH FLUOROSCOPY;  Surgeon: Meridee Score Netty Starring., MD;  Location: [facility] ENDOSCOPY;  Service: Gastroenterology;  Laterality: N/A;   EUS N/A 07/24/2021   Procedure: UPPER ENDOSCOPIC ULTRASOUND (EUS) RADIAL;  Surgeon: Lemar Lofty., MD;  Location: [facility] ENDOSCOPY;  Service: Gastroenterology;  Laterality: N/A;   lumbar spine stimulator     POLYPECTOMY  06/06/2018   Procedure: POLYPECTOMY;  Surgeon: Mansouraty, Netty Starring., MD;  Location: [facility] ENDOSCOPY;  Service: Gastroenterology;;   REMOVAL OF STONES  03/30/2018   Procedure: REMOVAL OF STONES;  Surgeon: Lemar Lofty., MD;  Location: [facility] ENDOSCOPY;  Service: Gastroenterology;;   REMOVAL OF STONES  06/05/2021   Procedure: REMOVAL OF STONES;  Surgeon:  Mansouraty, Netty Starring., MD;  Location: [facility] ENDOSCOPY;  Service: Gastroenterology;;   REMOVAL OF STONES  09/15/2021   Procedure: REMOVAL OF STONES;  Surgeon: Lemar Lofty., MD;  Location: [facility] ENDOSCOPY;  Service: Gastroenterology;;   Dennison Mascot  01/08/2018   Procedure: Dennison Mascot;  Surgeon: Lynann Bologna, MD;  Location: [facility] ENDOSCOPY;  Service: Endoscopy;;   SPHINCTEROTOMY  02/09/2018   Procedure: Dennison Mascot;  Surgeon: Mansouraty, Netty Starring., MD;  Location: [facility] ENDOSCOPY;  Service: Gastroenterology;;   Burman Freestone CHOLANGIOSCOPY N/A 06/05/2021   Procedure: ZOXWRUEA CHOLANGIOSCOPY;  Surgeon: Lemar Lofty., MD;  Location: [facility] ENDOSCOPY;  Service: Gastroenterology;  Laterality: N/A;   STENT REMOVAL  02/09/2018   Procedure: STENT REMOVAL;  Surgeon: Lemar Lofty., MD;  Location: [facility] ENDOSCOPY;  Service: Gastroenterology;;   Francine Graven REMOVAL  03/30/2018   Procedure: STENT  REMOVAL;  Surgeon: Lemar Lofty., MD;  Location: [facility] ENDOSCOPY;  Service: Gastroenterology;;   Francine Graven REMOVAL  06/05/2021   Procedure: STENT REMOVAL;  Surgeon: Lemar Lofty., MD;  Location: [facility] ENDOSCOPY;  Service: Gastroenterology;;   TRANSTHORACIC ECHOCARDIOGRAM  04/2015   EF 40-45%, mod antsept, ant, antlat, apical HK, mild AI/MR, PASP .   TRANSTHORACIC ECHOCARDIOGRAM  08/23/2020   EF 55 to 60%.  No RWMA.  Mild LVH.  GR 1 DD.  Normal RV size and function with normal RVP.  Normal mitral and aortic valve.   UVULOPALATOPHARYNGOPLASTY, TONSILLECTOMY AND SEPTOPLASTY  2002   VIDEO BRONCHOSCOPY N/A 10/29/2022   Procedure: VIDEO BRONCHOSCOPY WITH FLUORO;  Surgeon: [name], MD;  Location: [facility] ENDOSCOPY;  Service: Pulmonary;  Laterality: N/A;    Social History   Socioeconomic History   Marital status: Married    Spouse name: Robyn   Number of children: 3   Years of education: Not on file   Highest education level: Not on file  Occupational History   Occupation: retired  Tobacco Use   Smoking status: Former    Types: Cigarettes    Quit date: 1972    Years since quitting: 52.4   Smokeless tobacco: Never   Tobacco comments:    was very light  Vaping Use   Vaping Use: Never used  Substance and Sexual Activity   Alcohol use: Not Currently    Comment: ocassional -maybe 2 beers   Drug use: No   Sexual activity: Yes  Other Topics Concern   Not on file  Social History Narrative   Caffeine: 1 cup soda, occasional coffee   Lives with wife Zella Ball) and 40 yo son, 4 dogs   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   Activity: no regular activity   Diet: overall healthy, good fruits and vegetables, good amt water   Social Determinants of Health   Financial Resource Strain: Low Risk  (11/10/2022)   Overall Financial Resource Strain (CARDIA)    Difficulty of Paying Living Expenses: Not hard at all  Food  Insecurity: No Food Insecurity (11/10/2022)   Hunger Vital Sign    Worried About Running Out of Food in the Last Year: Never true    Ran Out of Food in the Last Year: Never true  Transportation Needs: No Transportation Needs (11/10/2022)   PRAPARE - Administrator, Civil Service (Medical): No    Lack of Transportation (Non-Medical): No  Physical Activity: Insufficiently Active (11/10/2022)   Exercise Vital Sign    Days of Exercise per Week: 4 days    Minutes of Exercise  per Session: 10 min  Stress: No Stress Concern Present (11/10/2022)   [employer] of Occupational Health - Occupational Stress Questionnaire    Feeling of Stress : Not at all  Social Connections: Moderately Integrated (11/10/2022)   Social Connection and Isolation Panel [NHANES]    Frequency of Communication with Friends and Family: More than three times a week    Frequency of Social Gatherings with Friends and Family: More than three times a week    Attends Religious Services: More than 4 times per year    Active Member of [employer] or Organizations: No    Attends Banker Meetings: Never    Marital Status: Married  Catering manager Violence: Not At Risk (11/10/2022)   Humiliation, Afraid, Rape, and Kick questionnaire    Fear of Current or Ex-Partner: No    Emotionally Abused: No    Physically Abused: No    Sexually Abused: No     No Known Allergies   Outpatient Medications Prior to Visit  Medication Sig Dispense Refill   acetaminophen (TYLENOL) 500 MG tablet Take 1,000 mg by mouth every 6 (six) hours as needed for moderate pain or headache.      Ascorbic Acid (VITAMIN C) 1000 MG tablet Take 1,000 mg by mouth daily.     Carboxymethylcellulose Sodium (LUBRICANT EYE DROPS OP) Place 2 drops into both eyes daily as needed (for dry eyes).     Cholecalciferol (VITAMIN D3) 50 MCG (2000 UT) TABS Take 2,000 Units by mouth daily.     gabapentin (NEURONTIN) 600 MG tablet TAKE 2 TABLETS BY MOUTH 3 TIMES   DAILY 540 tablet 3   ibuprofen (ADVIL) 200 MG tablet Take 400 mg by mouth every 6 (six) hours as needed for headache or moderate pain.     Magnesium 250 MG TABS Take 1 tablet (250 mg total) by mouth at bedtime. (Patient taking differently: Take 250 mg by mouth at bedtime.)  0   mirabegron ER (MYRBETRIQ) 25 MG TB24 tablet Take 1 tablet (25 mg total) by mouth daily. 30 tablet 11   Omega-3 Fatty Acids (FISH OIL) 1000 MG CAPS Take 1,000 mg by mouth daily.      omeprazole (PRILOSEC) 40 MG capsule Take 1 capsule (40 mg total) by mouth 2 (two) times daily before a meal. 180 capsule 4   rosuvastatin (CRESTOR) 40 MG tablet Take 1 tablet (40 mg total) by mouth daily. 90 tablet 4   sildenafil (VIAGRA) 100 MG tablet TAKE 1/2 TO 1 TABLET BY MOUTH DAILY AS NEEDED FOR ERECTILE DYSFUNCTION (Patient taking differently: Take 50-100 mg by mouth daily as needed for erectile dysfunction.) 6 tablet 0   tamsulosin (FLOMAX) 0.4 MG CAPS capsule Take 1 capsule (0.4 mg total) by mouth daily. 90 capsule 4   vitamin B-12 (CYANOCOBALAMIN) 1000 MCG tablet Take 1,000 mcg by mouth daily.     vitamin E 400 UNIT capsule Take 400 Units by mouth daily.     No facility-administered medications prior to visit.       Objective:   Physical Exam:  General appearance: 72 y.o., male, NAD, conversant  Eyes: anicteric sclerae; PERRL, tracking appropriately HENT: NCAT; MMM Neck: Trachea midline; no lymphadenopathy, no JVD Lungs: +crackles, no wheeze, with normal respiratory effort CV: RRR, no murmur  Abdomen: Soft, non-tender; non-distended, BS present  Extremities: No peripheral edema, warm Skin: Normal turgor and texture; no rash Psych: Appropriate affect Neuro: Alert and oriented to person and place, no focal deficit  Vitals:   11/13/22 0935  BP: 126/72  Pulse: 78  Temp: 98.4 F (36.9 C)  TempSrc: Oral  SpO2: 95%  Weight: 213 lb 12.8 oz (97 kg)  Height: 5' 9.25" (1.759 m)      95% on RA BMI Readings from  Last 3 Encounters:  11/13/22 31.35 kg/m  11/11/22 31.14 kg/m  11/10/22 29.41 kg/m   Wt Readings from Last 3 Encounters:  11/13/22 213 lb 12.8 oz (97 kg)  11/11/22 212 lb 6 oz (96.3 kg)  11/10/22 205 lb (93 kg)     CBC    Component Value Date/Time   WBC 6.4 10/29/2022 0706   RBC 4.76 10/29/2022 0706   HGB 15.3 10/29/2022 0827   HCT 45.0 10/29/2022 0827   PLT 214 10/29/2022 0706   MCV 97.7 10/29/2022 0706   MCH 33.0 10/29/2022 0706   MCHC 33.8 10/29/2022 0706   RDW 13.0 10/29/2022 0706   LYMPHSABS 2.6 05/04/2022 1143   MONOABS 0.5 05/04/2022 1143   EOSABS 0.4 05/04/2022 1143   BASOSABS 0.1 05/04/2022 1143    Chest Imaging: CXR 05/29/22 with low lung volumes, coarse interstitial markings  CT AP 06/20/21 with bilateral lower lobe predominant fibrosis, reticular opacities, no honeycombing  HRCT with widespread reticular opacities, subpleural fibrosis with traction bronchiolectasis without craniocaudal gradient, mild air trapping  Pulmonary Functions Testing Results:    Latest Ref Rng & Units 08/21/2022   10:42 AM 08/01/2015    2:22 PM  PFT Results  FVC-Pre L 2.42  5.42  P  FVC-Predicted Pre % 55  113  P  FVC-Post L 2.12  5.35  P  FVC-Predicted Post % 48  112  P  Pre FEV1/FVC % % 84  85  P  Post FEV1/FCV % % 90  87  P  FEV1-Pre L 2.04  4.58  P  FEV1-Predicted Pre % 64  129  P  FEV1-Post L 1.92  4.65  P  DLCO uncorrected ml/min/mmHg 17.86  20.37  P  DLCO UNC% % 69  60  P  DLCO corrected ml/min/mmHg 17.86    DLCO COR %Predicted % 69    DLVA Predicted % 93  57  P  TLC L 4.08  7.55  P  TLC % Predicted % 58  104  P  RV % Predicted % 59  82  P    P Preliminary result   PFT 2017 with mildly reduced diffusing capacity    Echocardiogram:    1. Left ventricular ejection fraction, by estimation, is 55 to 60%. The  left ventricle has normal function. The left ventricle has no regional  wall motion abnormalities. There is moderate left ventricular hypertrophy.   Left ventricular diastolic  parameters are consistent with Grade I diastolic dysfunction (impaired  relaxation).   2. Right ventricular systolic function is normal. The right ventricular  size is normal.   3. The mitral valve is normal in structure. Trivial mitral valve  regurgitation. No evidence of mitral stenosis.   4. The aortic valve is tricuspid. Aortic valve regurgitation is mild. No  aortic stenosis is present.       Assessment & Plan:   # Chronic cough Components of likely PND, GERD though worry about progressive fibrotic lung disease as below  # ILD Unclear etiology. HRCT alternative diagnosis - not UIP. Possible chronic HP given long term dust exposure/gardening and this is favored after discussion in ILD conference. In past attributed to ARDS/ALI though doesn't look like he had ARDS  or significant lung injury when he was hospitalized in 2016. Had about 6 months of amiodarone exposure in 2016 for AF. BAL cell count/diff 5/30 with elevated lymphs and Eos which could be supportive of chronic HP or drug induced ILD (though only potential medication I see is amiodarone and he's been off of it for years and wouldn't necessarily expect crestor assd ILD to have this pattern). Nothing concerning by history for underlying CTD though CK is elevated raising possibility of myositis however may just be elevated from statin, polio, and/or dehydration. Potentially related to severe reflux/microaspiration given some lower lobe predominance.  Still waiting on envisia results from bronch.   # OSA not on CPAP  # Chronic hypoxic respiratory failure   Plan: - start prednisone 30 mg daily for 8 weeks, 20 mg daily for 2 weeks, then remain on 10 mg daily until follow up with Dr. Marchelle Gearing. In absence of other obviously immune suppressing condition I don't think he needs PJP ppx.  - start autoPAP 5-15 with dreamwear mask, check ONO with CPAP - will ensure started on POC 2L O2 with exertion - RTC 3  months with MR and in office spirometry   [name], MD [facility] 11/13/2022 12:18 PM

## 2022-11-10 NOTE — Patient Instructions (Signed)
[name] , Thank you for taking time to come for your Medicare Wellness Visit. I appreciate your ongoing commitment to your health goals. Please review the following plan we discussed and let me know if I can assist you in the future.   These are the goals we discussed:  Goals      Increase physical activity     Starting 11/24/2018, I will continue to exercise for at least 30 minutes daily in an effort to reach target weight of 200 lbs.      Increase physical activity     Spend more time outside     Patient Stated     Build energy and lose weight         This is a list of the screening recommended for you and due dates:  Health Maintenance  Topic Date Due   COVID-19 Vaccine (5 - 2023-24 season) 01/30/2022   DTaP/Tdap/Td vaccine (2 - Tdap) 05/03/2022   Zoster (Shingles) Vaccine (2 of 2) 06/26/2022   Flu Shot  12/31/2022   Colon Cancer Screening  06/07/2023   Medicare Annual Wellness Visit  11/10/2023   Pneumonia Vaccine  Completed   Hepatitis C Screening  Completed   HPV Vaccine  Aged Out    Advanced directives: on file in the chart  Conditions/risks identified: Aim for 30 minutes of exercise or brisk walking, 6-8 glasses of water, and 5 servings of fruits and vegetables each day.   Next appointment: Follow up in one year for your annual wellness visit. 11/11/23 @ 8:45 am televisit.  Preventive Care 35 Years and Older, Male  Preventive care refers to lifestyle choices and visits with your health care provider that can promote health and wellness. What does preventive care include? A yearly physical exam. This is also called an annual well check. Dental exams once or twice a year. Routine eye exams. Ask your health care provider how often you should have your eyes checked. Personal lifestyle choices, including: Daily care of your teeth and gums. Regular physical activity. Eating a healthy diet. Avoiding tobacco and drug use. Limiting alcohol use. Practicing safe  sex. Taking low doses of aspirin every day. Taking vitamin and mineral supplements as recommended by your health care provider. What happens during an annual well check? The services and screenings done by your health care provider during your annual well check will depend on your age, overall health, lifestyle risk factors, and family history of disease. Counseling  Your health care provider may ask you questions about your: Alcohol use. Tobacco use. Drug use. Emotional well-being. Home and relationship well-being. Sexual activity. Eating habits. History of falls. Memory and ability to understand (cognition). Work and work Astronomer. Screening  You may have the following tests or measurements: Height, weight, and BMI. Blood pressure. Lipid and cholesterol levels. These may be checked every 5 years, or more frequently if you are over 45 years old. Skin check. Lung cancer screening. You may have this screening every year starting at age 52 if you have a 30-pack-year history of smoking and currently smoke or have quit within the past 15 years. Fecal occult blood test (FOBT) of the stool. You may have this test every year starting at age 74. Flexible sigmoidoscopy or colonoscopy. You may have a sigmoidoscopy every 5 years or a colonoscopy every 10 years starting at age 74. Prostate cancer screening. Recommendations will vary depending on your family history and other risks. Hepatitis C blood test. Hepatitis B blood test. Sexually transmitted  disease (STD) testing. Diabetes screening. This is done by checking your blood sugar (glucose) after you have not eaten for a while (fasting). You may have this done every 1-3 years. Abdominal aortic aneurysm (AAA) screening. You may need this if you are a current or former smoker. Osteoporosis. You may be screened starting at age 61 if you are at high risk. Talk with your health care provider about your test results, treatment options, and if  necessary, the need for more tests. Vaccines  Your health care provider may recommend certain vaccines, such as: Influenza vaccine. This is recommended every year. Tetanus, diphtheria, and acellular pertussis (Tdap, Td) vaccine. You may need a Td booster every 10 years. Zoster vaccine. You may need this after age 95. Pneumococcal 13-valent conjugate (PCV13) vaccine. One dose is recommended after age 12. Pneumococcal polysaccharide (PPSV23) vaccine. One dose is recommended after age 54. Talk to your health care provider about which screenings and vaccines you need and how often you need them. This information is not intended to replace advice given to you by your health care provider. Make sure you discuss any questions you have with your health care provider. Document Released: 06/14/2015 Document Revised: 02/05/2016 Document Reviewed: 03/19/2015 Elsevier Interactive Patient Education  2017 [employer].  Fall Prevention in the Home Falls can cause injuries. They can happen to people of all ages. There are many things you can do to make your home safe and to help prevent falls. What can I do on the outside of my home? Regularly fix the edges of walkways and driveways and fix any cracks. Remove anything that might make you trip as you walk through a door, such as a raised step or threshold. Trim any bushes or trees on the path to your home. Use bright outdoor lighting. Clear any walking paths of anything that might make someone trip, such as rocks or tools. Regularly check to see if handrails are loose or broken. Make sure that both sides of any steps have handrails. Any raised decks and porches should have guardrails on the edges. Have any leaves, snow, or ice cleared regularly. Use sand or salt on walking paths during winter. Clean up any spills in your garage right away. This includes oil or grease spills. What can I do in the bathroom? Use night lights. Install grab bars by the toilet  and in the tub and shower. Do not use towel bars as grab bars. Use non-skid mats or decals in the tub or shower. If you need to sit down in the shower, use a plastic, non-slip stool. Keep the floor dry. Clean up any water that spills on the floor as soon as it happens. Remove soap buildup in the tub or shower regularly. Attach bath mats securely with double-sided non-slip rug tape. Do not have throw rugs and other things on the floor that can make you trip. What can I do in the bedroom? Use night lights. Make sure that you have a light by your bed that is easy to reach. Do not use any sheets or blankets that are too big for your bed. They should not hang down onto the floor. Have a firm chair that has side arms. You can use this for support while you get dressed. Do not have throw rugs and other things on the floor that can make you trip. What can I do in the kitchen? Clean up any spills right away. Avoid walking on wet floors. Keep items that you use a  lot in easy-to-reach places. If you need to reach something above you, use a strong step stool that has a grab bar. Keep electrical cords out of the way. Do not use floor polish or wax that makes floors slippery. If you must use wax, use non-skid floor wax. Do not have throw rugs and other things on the floor that can make you trip. What can I do with my stairs? Do not leave any items on the stairs. Make sure that there are handrails on both sides of the stairs and use them. Fix handrails that are broken or loose. Make sure that handrails are as long as the stairways. Check any carpeting to make sure that it is firmly attached to the stairs. Fix any carpet that is loose or worn. Avoid having throw rugs at the top or bottom of the stairs. If you do have throw rugs, attach them to the floor with carpet tape. Make sure that you have a light switch at the top of the stairs and the bottom of the stairs. If you do not have them, ask someone to add  them for you. What else can I do to help prevent falls? Wear shoes that: Do not have high heels. Have rubber bottoms. Are comfortable and fit you well. Are closed at the toe. Do not wear sandals. If you use a stepladder: Make sure that it is fully opened. Do not climb a closed stepladder. Make sure that both sides of the stepladder are locked into place. Ask someone to hold it for you, if possible. Clearly mark and make sure that you can see: Any grab bars or handrails. First and last steps. Where the edge of each step is. Use tools that help you move around (mobility aids) if they are needed. These include: Canes. Walkers. Scooters. Crutches. Turn on the lights when you go into a dark area. Replace any light bulbs as soon as they burn out. Set up your furniture so you have a clear path. Avoid moving your furniture around. If any of your floors are uneven, fix them. If there are any pets around you, be aware of where they are. Review your medicines with your doctor. Some medicines can make you feel dizzy. This can increase your chance of falling. Ask your doctor what other things that you can do to help prevent falls. This information is not intended to replace advice given to you by your health care provider. Make sure you discuss any questions you have with your health care provider. Document Released: 03/14/2009 Document Revised: 10/24/2015 Document Reviewed: 06/22/2014 Elsevier Interactive Patient Education  2017 [employer].

## 2022-11-10 NOTE — Progress Notes (Signed)
I connected with  Alexander [name] on 11/10/22 by a audio enabled telemedicine application and verified that I am speaking with the correct person using two identifiers.  Patient Location: Home  Provider Location: Home Office  I discussed the limitations of evaluation and management by telemedicine. The patient expressed understanding and agreed to proceed.  Subjective:   Alexander [name] is a 72 y.o. male who presents for Medicare Annual/Subsequent preventive examination.  Review of Systems      Cardiac Risk Factors include: advanced age (>16men, >52 women);hypertension;sedentary lifestyle;male gender     Objective:    Today's Vitals   11/10/22 0855  Weight: 205 lb (93 kg)  Height: 5\' 10"  (1.778 m)  PainSc: 4    Body mass index is 29.41 kg/m.     11/10/2022    9:07 AM 10/29/2022    6:37 AM 10/21/2021    9:06 AM 09/15/2021    8:41 AM 07/24/2021    8:32 AM 06/05/2021    7:18 AM 04/01/2021   12:07 PM  Advanced Directives  Does Patient Have a Medical Advance Directive? Yes Yes Yes Yes Yes No No  Type of Estate agent of [place];Living will Healthcare Power of [place];Living will Healthcare Power of [place] Living will Healthcare Power of [place];Living will    Does patient want to make changes to medical advance directive? No - Patient declined        Copy of Healthcare Power of Attorney in Chart? Yes - validated most recent copy scanned in chart (See row information)  Yes - validated most recent copy scanned in chart (See row information)  No - copy requested    Would patient like information on creating a medical advance directive?      Yes (MAU/Ambulatory/Procedural Areas - Information given) No - Patient declined    Current Medications (verified) Outpatient Encounter Medications as of 11/10/2022  Medication Sig   acetaminophen (TYLENOL) 500 MG tablet Take 1,000 mg by mouth every 6 (six) hours as needed for moderate pain or headache.    Ascorbic Acid  (VITAMIN C) 1000 MG tablet Take 1,000 mg by mouth daily.   Carboxymethylcellulose Sodium (LUBRICANT EYE DROPS OP) Place 2 drops into both eyes daily as needed (for dry eyes).   Cholecalciferol (VITAMIN D3) 50 MCG (2000 UT) TABS Take 2,000 Units by mouth daily.   gabapentin (NEURONTIN) 600 MG tablet TAKE 2 TABLETS BY MOUTH 3 TIMES  DAILY   ibuprofen (ADVIL) 200 MG tablet Take 400 mg by mouth every 6 (six) hours as needed for headache or moderate pain.   Magnesium 250 MG TABS Take 1 tablet (250 mg total) by mouth at bedtime. (Patient taking differently: Take 250 mg by mouth at bedtime.)   mirabegron ER (MYRBETRIQ) 25 MG TB24 tablet TAKE 1 TABLET (25 MG TOTAL) BY MOUTH DAILY.   Omega-3 Fatty Acids (FISH OIL) 1000 MG CAPS Take 1,000 mg by mouth daily.    omeprazole (PRILOSEC) 40 MG capsule TAKE 1 CAPSULE (40 MG TOTAL) BY MOUTH 2 (TWO) TIMES DAILY BEFORE A MEAL.   rosuvastatin (CRESTOR) 40 MG tablet TAKE 1 TABLET BY MOUTH DAILY   sildenafil (VIAGRA) 100 MG tablet TAKE 1/2 TO 1 TABLET BY MOUTH DAILY AS NEEDED FOR ERECTILE DYSFUNCTION (Patient taking differently: Take 50-100 mg by mouth daily as needed for erectile dysfunction.)   tamsulosin (FLOMAX) 0.4 MG CAPS capsule TAKE 1 CAPSULE BY MOUTH EVERY DAY   vitamin B-12 (CYANOCOBALAMIN) 1000 MCG tablet Take 1,000 mcg by mouth  daily.   vitamin E 400 UNIT capsule Take 400 Units by mouth daily.   No facility-administered encounter medications on file as of 11/10/2022.    Allergies (verified) Patient has no known allergies.   History: Past Medical History:  Diagnosis Date   Acute cholecystitis 03/2016   with sepsis   Arthritis    CAD S/P percutaneous coronary angioplasty 1995, 2000, 2005   a. 1995 s/p BMS;  b. 2000 s/p BMS;  c. 2005 s/p stent - All stents in [facility] (RCA, LAD & OM - unknown on which date);  d. 04/2015 NSTEMI/Cath: LM nl, ost/p LAD 20%, patent mLAD stent, RI small, OM2 patent stent, pRCA 20%, patent stent, EF 45-50%-->Med Rx.    Calculus of bile duct with acute cholangitis with obstruction    Chronic pain syndrome    a. Followed @ [facility];  b. Uses 12-14 excedrins per day.   Complication of anesthesia 04/01/2021   agiated- "battling nurses"   Duodenal stricture    Essential hypertension    Fatty liver    Gallstones    GERD (gastroesophageal reflux disease)    History of depression    History of post poliomyelitis muscular atrophy    HLD (hyperlipidemia)    Ischemic cardiomyopathy    a. 04/2015 Echo: EF 40-45%, mod antsept, ant, antlat, apical HK, mild AI/MR, PASP .   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 04/2015 in setting of cholecystits and sepsis -->Amio;  b. CHA2DS2VASc = 4--> Eliquis.   Pneumonia 04/2015   Post-polio syndrome    a. ambulates with braces.   Septic shock (HCC) 01/07/2018   Shoulder pain, right    Past Surgical History:  Procedure Laterality Date   BALLOON DILATION N/A 06/06/2018   Procedure: BALLOON DILATION;  Surgeon: Meridee Score Netty Starring., MD;  Location: [facility] ENDOSCOPY;  Service: Gastroenterology;  Laterality: N/A;   BALLOON DILATION N/A 07/06/2018   Procedure: BALLOON DILATION;  Surgeon: Meridee Score Netty Starring., MD;  Location: [facility] ENDOSCOPY;  Service: Gastroenterology;  Laterality: N/A;   BALLOON DILATION N/A 07/24/2021   Procedure: BALLOON DILATION;  Surgeon: Meridee Score Netty Starring., MD;  Location: [facility] ENDOSCOPY;  Service: Gastroenterology;  Laterality: N/A;   BILIARY BRUSHING  06/05/2021   Procedure: BILIARY BRUSHING;  Surgeon: Meridee Score Netty Starring., MD;  Location: [facility] ENDOSCOPY;  Service: Gastroenterology;;   BILIARY DILATION  06/05/2021   Procedure: BILIARY DILATION;  Surgeon: Lemar Lofty., MD;  Location: [facility] ENDOSCOPY;  Service: Gastroenterology;;   BILIARY  DILATION  09/15/2021   Procedure: BILIARY DILATION;  Surgeon: Lemar Lofty., MD;  Location: [facility] ENDOSCOPY;  Service: Gastroenterology;;   BILIARY STENT PLACEMENT  01/08/2018   Procedure: BILIARY STENT PLACEMENT;  Surgeon: Lynann Bologna, MD;  Location: [facility] ENDOSCOPY;  Service: Endoscopy;;   BILIARY STENT PLACEMENT N/A 02/09/2018   Procedure: BILIARY STENT PLACEMENT;  Surgeon: Lemar Lofty., MD;  Location: [facility] ENDOSCOPY;  Service: Gastroenterology;  Laterality: N/A;   BILIARY STENT PLACEMENT  04/01/2021   Procedure: BILIARY STENT PLACEMENT;  Surgeon: Lynann Bologna, MD;  Location: [facility] ENDOSCOPY;  Service: Endoscopy;;   BIOPSY  06/06/2018   Procedure: BIOPSY;  Surgeon: Lemar Lofty., MD;  Location: [facility] ENDOSCOPY;  Service: Gastroenterology;;   BIOPSY  04/01/2021  Procedure: BIOPSY;  Surgeon: Lynann Bologna, MD;  Location: [facility] ENDOSCOPY;  Service: Endoscopy;;   BIOPSY  06/05/2021   Procedure: BIOPSY;  Surgeon: Lemar Lofty., MD;  Location: [facility] ENDOSCOPY;  Service: Gastroenterology;;   BRONCHIAL BIOPSY  10/29/2022   Procedure: BRONCHIAL BIOPSIES;  Surgeon: [name], MD;  Location: [facility] ENDOSCOPY;  Service: Pulmonary;;   BRONCHIAL WASHINGS  10/29/2022   Procedure: BRONCHIAL WASHINGS;  Surgeon: [name], MD;  Location: [facility] ENDOSCOPY;  Service: Pulmonary;;   BRONCHOSCOPY  09/2022   benign biopsies, negative for eosinophils, granuloma, fungal (Meier)   CARDIAC CATHETERIZATION N/A 04/30/2015   Procedure: Left Heart Cath and Coronary Angiography;  Surgeon: Lennette Bihari, MD;  Location: [facility] INVASIVE CV LAB;  Service: Cardiovascular; LM nl, ost/p LAD 20%, patent mLAD stent, RI small, OM2 patent stent, pRCA 20%, patent stent, EF 45-50%-->Med Rx    cervical spine stimulator  08/2012   CHOLECYSTECTOMY  06/10/2015   Procedure: LAPAROSCOPIC CHOLECYSTECTOMY;  Surgeon: De Blanch Kinsinger, MD;  Location: [facility] OR;  Service: General;;   COLONOSCOPY  09/2013   TA x2,  rpt 5 yrs (Pyrtle)   COLONOSCOPY WITH ESOPHAGOGASTRODUODENOSCOPY (EGD)  06/2018   reactive gastropathy, tubular adenoma, rpt colonoscopy 5 yrs (Mansouraty)   COLONOSCOPY WITH PROPOFOL N/A 06/06/2018   Procedure: COLONOSCOPY WITH PROPOFOL;  Surgeon: Lemar Lofty., MD;  Location: [facility] ENDOSCOPY;  Service: Gastroenterology;  Laterality: N/A;   CORONARY ANGIOPLASTY WITH STENT PLACEMENT  1995, 2000, 2005   stents in RCA, LAD & Cx-OM. -Stents were patent by cardiac catheterization in November 2016   ENDOSCOPIC RETROGRADE CHOLANGIOPANCREATOGRAPHY (ERCP) WITH PROPOFOL N/A 02/09/2018   Procedure: ENDOSCOPIC RETROGRADE CHOLANGIOPANCREATOGRAPHY (ERCP) WITH PROPOFOL;  Surgeon: Lemar Lofty., MD;  Location: [facility] ENDOSCOPY;  Service: Gastroenterology;  Laterality: N/A;   ENDOSCOPIC RETROGRADE CHOLANGIOPANCREATOGRAPHY (ERCP) WITH PROPOFOL N/A 03/30/2018   Procedure: ENDOSCOPIC RETROGRADE CHOLANGIOPANCREATOGRAPHY (ERCP) WITH PROPOFOL;  Surgeon: Meridee Score Netty Starring., MD;  Location: [facility] ENDOSCOPY;  Service: Gastroenterology;  Laterality: N/A;   ENDOSCOPIC RETROGRADE CHOLANGIOPANCREATOGRAPHY (ERCP) WITH PROPOFOL N/A 06/05/2021   Procedure: ENDOSCOPIC RETROGRADE CHOLANGIOPANCREATOGRAPHY (ERCP) WITH PROPOFOL;  Surgeon: Meridee Score Netty Starring., MD;  Location: [facility] ENDOSCOPY;  Service: Gastroenterology;  Laterality: N/A;   ENDOSCOPIC RETROGRADE CHOLANGIOPANCREATOGRAPHY (ERCP) WITH PROPOFOL N/A 09/15/2021   Procedure: ENDOSCOPIC RETROGRADE CHOLANGIOPANCREATOGRAPHY (ERCP) WITH PROPOFOL;  Surgeon: Meridee Score Netty Starring., MD;  Location: [facility] ENDOSCOPY;  Service: Gastroenterology;  Laterality: N/A;   ERCP N/A 01/08/2018   Procedure: ENDOSCOPIC RETROGRADE CHOLANGIOPANCREATOGRAPHY (ERCP);  Surgeon: Lynann Bologna, MD;  Location: [facility] ENDOSCOPY;  Service: Endoscopy;  Laterality: N/A;   ERCP N/A 04/01/2021   Procedure: ENDOSCOPIC RETROGRADE CHOLANGIOPANCREATOGRAPHY (ERCP);  Surgeon: Lynann Bologna, MD;  Location: [facility]  ENDOSCOPY;  Service: Endoscopy;  Laterality: N/A;  Please schedule after 1 PM   ESOPHAGOGASTRODUODENOSCOPY N/A 03/30/2018   Procedure: ESOPHAGOGASTRODUODENOSCOPY (EGD);  Surgeon: Lemar Lofty., MD;  Location: [facility] ENDOSCOPY;  Service: Gastroenterology;  Laterality: N/A;   ESOPHAGOGASTRODUODENOSCOPY (EGD) WITH PROPOFOL N/A 06/06/2018   Procedure: ESOPHAGOGASTRODUODENOSCOPY (EGD) WITH PROPOFOL;  Surgeon: Meridee Score Netty Starring., MD;  Location: [facility] ENDOSCOPY;  Service: Gastroenterology;  Laterality: N/A;   ESOPHAGOGASTRODUODENOSCOPY (EGD) WITH PROPOFOL N/A 07/06/2018   Procedure: ESOPHAGOGASTRODUODENOSCOPY (EGD) WITH PROPOFOL;  Surgeon: Meridee Score Netty Starring., MD;  Location: [facility] ENDOSCOPY;  Service: Gastroenterology;  Laterality: N/A;   ESOPHAGOGASTRODUODENOSCOPY (EGD) WITH PROPOFOL N/A 07/24/2021   Procedure: ESOPHAGOGASTRODUODENOSCOPY (EGD) WITH PROPOFOL WITH FLUOROSCOPY;  Surgeon: Meridee Score Netty Starring., MD;  Location: [facility] ENDOSCOPY;  Service: Gastroenterology;  Laterality: N/A;   EUS N/A 07/24/2021   Procedure:  UPPER ENDOSCOPIC ULTRASOUND (EUS) RADIAL;  Surgeon: Meridee Score Netty Starring., MD;  Location: [facility] ENDOSCOPY;  Service: Gastroenterology;  Laterality: N/A;   lumbar spine stimulator     POLYPECTOMY  06/06/2018   Procedure: POLYPECTOMY;  Surgeon: Mansouraty, Netty Starring., MD;  Location: [facility] ENDOSCOPY;  Service: Gastroenterology;;   REMOVAL OF STONES  03/30/2018   Procedure: REMOVAL OF STONES;  Surgeon: Lemar Lofty., MD;  Location: [facility] ENDOSCOPY;  Service: Gastroenterology;;   REMOVAL OF STONES  06/05/2021   Procedure: REMOVAL OF STONES;  Surgeon: Lemar Lofty., MD;  Location: [facility] ENDOSCOPY;  Service: Gastroenterology;;   REMOVAL OF STONES  09/15/2021   Procedure: REMOVAL OF STONES;  Surgeon: Lemar Lofty., MD;  Location: [facility] ENDOSCOPY;  Service: Gastroenterology;;   Dennison Mascot  01/08/2018   Procedure: Dennison Mascot;  Surgeon: Lynann Bologna, MD;  Location:  [facility] ENDOSCOPY;  Service: Endoscopy;;   SPHINCTEROTOMY  02/09/2018   Procedure: Dennison Mascot;  Surgeon: Mansouraty, Netty Starring., MD;  Location: [facility] ENDOSCOPY;  Service: Gastroenterology;;   [facility] CHOLANGIOSCOPY N/A 06/05/2021   Procedure: ZOXWRUEA CHOLANGIOSCOPY;  Surgeon: Lemar Lofty., MD;  Location: [facility] ENDOSCOPY;  Service: Gastroenterology;  Laterality: N/A;   STENT REMOVAL  02/09/2018   Procedure: STENT REMOVAL;  Surgeon: Lemar Lofty., MD;  Location: [facility] ENDOSCOPY;  Service: Gastroenterology;;   Francine Graven REMOVAL  03/30/2018   Procedure: STENT REMOVAL;  Surgeon: Lemar Lofty., MD;  Location: [facility] ENDOSCOPY;  Service: Gastroenterology;;   Francine Graven REMOVAL  06/05/2021   Procedure: STENT REMOVAL;  Surgeon: Lemar Lofty., MD;  Location: [facility] ENDOSCOPY;  Service: Gastroenterology;;   TRANSTHORACIC ECHOCARDIOGRAM  04/2015   EF 40-45%, mod antsept, ant, antlat, apical HK, mild AI/MR, PASP .   TRANSTHORACIC ECHOCARDIOGRAM  08/23/2020   EF 55 to 60%.  No RWMA.  Mild LVH.  GR 1 DD.  Normal RV size and function with normal RVP.  Normal mitral and aortic valve.   UVULOPALATOPHARYNGOPLASTY, TONSILLECTOMY AND SEPTOPLASTY  2002   VIDEO BRONCHOSCOPY N/A 10/29/2022   Procedure: VIDEO BRONCHOSCOPY WITH FLUORO;  Surgeon: [name], MD;  Location: [facility] ENDOSCOPY;  Service: Pulmonary;  Laterality: N/A;   Family History  Problem Relation Age of Onset   Diabetes Mother    Heart disease Mother    Cancer Sister 42       male cancer   Breast cancer Brother 73   Hypertension Maternal Aunt    Cancer Maternal Uncle        spinal   Coronary artery disease Neg Hx    Stroke Neg Hx    Colon cancer Neg Hx    Pancreatic cancer Neg Hx    Stomach cancer Neg Hx    Esophageal cancer Neg Hx    Inflammatory bowel disease Neg Hx    Liver disease Neg Hx    Rectal cancer Neg Hx    Social History   Socioeconomic History   Marital status: Married    Spouse name: Robyn    Number of children: 3   Years of education: Not on file   Highest education level: Not on file  Occupational History   Occupation: retired  Tobacco Use   Smoking status: Former    Types: Cigarettes    Quit date: 1972    Years since quitting: 52.4   Smokeless tobacco: Never   Tobacco comments:    was very light  Vaping Use   Vaping Use: Never used  Substance and Sexual Activity   Alcohol use: Not Currently  Comment: ocassional -maybe 2 beers   Drug use: No   Sexual activity: Yes  Other Topics Concern   Not on file  Social History Narrative   Caffeine: 1 cup soda, occasional coffee   Lives with wife Zella Ball) and 35 yo son, 4 dogs   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   Activity: no regular activity   Diet: overall healthy, good fruits and vegetables, good amt water   Social Determinants of Health   Financial Resource Strain: Low Risk  (11/10/2022)   Overall Financial Resource Strain (CARDIA)    Difficulty of Paying Living Expenses: Not hard at all  Food Insecurity: No Food Insecurity (11/10/2022)   Hunger Vital Sign    Worried About Running Out of Food in the Last Year: Never true    Ran Out of Food in the Last Year: Never true  Transportation Needs: No Transportation Needs (11/10/2022)   PRAPARE - Administrator, Civil Service (Medical): No    Lack of Transportation (Non-Medical): No  Physical Activity: Insufficiently Active (11/10/2022)   Exercise Vital Sign    Days of Exercise per Week: 4 days    Minutes of Exercise per Session: 10 min  Stress: No Stress Concern Present (11/10/2022)   [employer] of Occupational Health - Occupational Stress Questionnaire    Feeling of Stress : Not at all  Social Connections: Moderately Integrated (11/10/2022)   Social Connection and Isolation Panel [NHANES]    Frequency of Communication with Friends and Family: More than three times a week    Frequency of Social  Gatherings with Friends and Family: More than three times a week    Attends Religious Services: More than 4 times per year    Active Member of [employer] or Organizations: No    Attends Engineer, structural: Never    Marital Status: Married    Tobacco Counseling Counseling given: Not Answered Tobacco comments: was very light   Clinical Intake:  Pre-visit preparation completed: Yes  Pain : 0-10 Pain Score: 4  Pain Type: Chronic pain Pain Location: Generalized Pain Descriptors / Indicators: Aching     Nutritional Risks: None Diabetes: No  How often do you need to have someone help you when you read instructions, pamphlets, or other written materials from your doctor or pharmacy?: 1 - Never  Diabetic? no  Interpreter Needed?: No  Information entered by :: C.Desiree Daise LPN   Activities of Daily Living    11/10/2022    9:07 AM  In your present state of health, do you have any difficulty performing the following activities:  Hearing? 0  Vision? 0  Difficulty concentrating or making decisions? 1  Comment has short term memory issues  Walking or climbing stairs? 1  Comment on crutches but can get up the stairs using handrails  Dressing or bathing? 0  Doing errands, shopping? 0  Preparing Food and eating ? N  Using the Toilet? N  In the past six months, have you accidently leaked urine? Y  Comment occasionally if waits to long  Do you have problems with loss of bowel control? N  Managing your Medications? N  Managing your Finances? N  Housekeeping or managing your Housekeeping? N    Patient Care Team: Eustaquio Boyden, MD as PCP - General (Family Medicine) Marykay Lex, MD as PCP - Cardiology (Cardiology) Marykay Lex, MD as Consulting Physician (Cardiology) Merwyn Katos, MD (Inactive) as Consulting  Physician (Pulmonary Disease)  Indicate any recent Medical Services you may have received from other than [facility] providers in the past year (date may be  approximate).     Assessment:   This is a routine wellness examination for Alexander [name].  Hearing/Vision screen Hearing Screening - Comments:: No hearing loss Vision Screening - Comments:: Glasses - [employer]  Dietary issues and exercise activities discussed: Current Exercise Habits: Home exercise routine, Type of exercise: strength training/weights (bike), Time (Minutes): 15, Frequency (Times/Week): 4, Weekly Exercise (Minutes/Week): 60, Intensity: Mild, Exercise limited by: Other - see comments (on crutches)   Goals Addressed             This Visit's Progress    Patient Stated       Build energy and lose weight        Depression Screen    11/10/2022    9:06 AM 11/10/2022    9:05 AM 10/21/2021    9:10 AM 10/18/2020    2:58 PM 11/24/2018    9:19 AM 11/04/2017   10:37 AM 10/30/2016    1:36 PM  PHQ 2/9 Scores  PHQ - 2 Score 0 0 0 0 0 0 0  PHQ- 9 Score     0 5     Fall Risk    11/10/2022    9:00 AM 10/21/2021    9:06 AM 10/17/2021    5:19 PM 10/18/2020    2:57 PM 11/24/2018    9:19 AM  Fall Risk   Falls in the past year? 0 0 0 0 0  Number falls in past yr: 0 0     Injury with Fall? 0 0     Risk for fall due to : No Fall Risks Impaired balance/gait     Follow up Falls prevention discussed;Falls evaluation completed        FALL RISK PREVENTION PERTAINING TO THE HOME:  Any stairs in or around the home? Yes  If so, are there any without handrails? No  Home free of loose throw rugs in walkways, pet beds, electrical cords, etc? Yes  Adequate lighting in your home to reduce risk of falls? Yes   ASSISTIVE DEVICES UTILIZED TO PREVENT FALLS:  Life alert? No  Use of a cane, walker or w/c? Yes  Grab bars in the bathroom? Yes  Shower chair or bench in shower? Yes  Elevated toilet seat or a handicapped toilet? Yes    Cognitive Function:    11/24/2018    9:19 AM 11/04/2017   10:40 AM 10/30/2016    1:35 PM 10/29/2015    1:47 PM  MMSE - Mini Mental State Exam  Orientation to time 5 5 5  5   Orientation to Place 5 5 5 5   Registration 3 3 3 3   Attention/ Calculation 0 0 0 0  Recall 3 2 3 3   Recall-comments  unable to recall 1 of 3 words    Language- name 2 objects 0 0 0 0  Language- repeat 1 1 1 1   Language- follow 3 step command 0 3 3 3   Language- read & follow direction 0 0 0 0  Write a sentence 0 0 0 0  Copy design 0 0 0 0  Total score 17 19 20 20         11/10/2022    9:10 AM 10/21/2021    9:07 AM  6CIT Screen  What Year? 0 points 0 points  What month? 0 points 0 points  What time? 0 points 0  points  Count back from 20 0 points 0 points  Months in reverse 0 points 0 points  Repeat phrase 0 points 0 points  Total Score 0 points 0 points    Immunizations Immunization History  Administered Date(s) Administered   Fluad Quad(high Dose 65+) 01/20/2022   Influenza, High Dose Seasonal PF 03/04/2016, 02/21/2018, 02/23/2019, 03/11/2021   Influenza, Seasonal, Injecte, Preservative Fre 05/03/2012, 05/05/2014, 02/28/2015   Influenza,inj,Quad PF,6+ Mos 02/27/2013   Influenza-Unspecified 03/17/2017   PFIZER Comirnaty(Gray Top)Covid-19 Tri-Sucrose Vaccine 03/23/2020, 09/28/2020   PFIZER(Purple Top)SARS-COV-2 Vaccination 08/25/2019, 09/19/2019   Pneumococcal Conjugate-13 04/28/2015   Pneumococcal Polysaccharide-23 10/30/2016   Respiratory Syncytial Virus Vaccine,Recomb Aduvanted(Arexvy) 01/20/2022   Td 05/03/2012   Zoster Recombinat (Shingrix) 05/01/2022   Zoster, Live 06/29/2013    TDAP status: Due, Education has been provided regarding the importance of this vaccine. Advised may receive this vaccine at local pharmacy or Health Dept. Aware to provide a copy of the vaccination record if obtained from local pharmacy or Health Dept. Verbalized acceptance and understanding.  Flu Vaccine status: Up to date  Pneumococcal vaccine status: Up to date  Covid-19 vaccine status: Information provided on how to obtain vaccines.   Qualifies for Shingles Vaccine? Yes    Zostavax completed No   Shingrix Completed?: Yes  Screening Tests Health Maintenance  Topic Date Due   COVID-19 Vaccine (5 - 2023-24 season) 01/30/2022   DTaP/Tdap/Td (2 - Tdap) 05/03/2022   Zoster Vaccines- Shingrix (2 of 2) 06/26/2022   INFLUENZA VACCINE  12/31/2022   Colonoscopy  06/07/2023   Medicare Annual Wellness (AWV)  11/10/2023   Pneumonia Vaccine 39+ Years old  Completed   Hepatitis C Screening  Completed   HPV VACCINES  Aged Out    Health Maintenance  Health Maintenance Due  Topic Date Due   COVID-19 Vaccine (5 - 2023-24 season) 01/30/2022   DTaP/Tdap/Td (2 - Tdap) 05/03/2022   Zoster Vaccines- Shingrix (2 of 2) 06/26/2022    Colorectal cancer screening: Type of screening: Colonoscopy. Completed 06/06/18. Repeat every 5 years  Lung Cancer Screening: (Low Dose CT Chest recommended if Age 65-80 years, 30 pack-year currently smoking OR have quit w/in 15years.) does not qualify.   Lung Cancer Screening Referral: no  Additional Screening:  Hepatitis C Screening: does not qualify; Completed 03/31/21  Vision Screening: Recommended annual ophthalmology exams for early detection of glaucoma and other disorders of the eye. Is the patient up to date with their annual eye exam?  Yes  Who is the provider or what is the name of the office in which the patient attends annual eye exams? [facility] If pt is not established with a provider, would they like to be referred to a provider to establish care? Yes .   Dental Screening: Recommended annual dental exams for proper oral hygiene  Community Resource Referral / Chronic Care Management: CRR required this visit?  No   CCM required this visit?  No      Plan:     I have personally reviewed and noted the following in the patient's chart:   Medical and social history Use of alcohol, tobacco or illicit drugs  Current medications and supplements including opioid prescriptions. Patient is not currently taking opioid  prescriptions. Functional ability and status Nutritional status Physical activity Advanced directives List of other physicians Hospitalizations, surgeries, and ER visits in previous 12 months Vitals Screenings to include cognitive, depression, and falls Referrals and appointments  In addition, I have reviewed and discussed with patient certain preventive  protocols, quality metrics, and best practice recommendations. A written personalized care plan for preventive services as well as general preventive health recommendations were provided to patient.     Maryan Puls, LPN   [id number]   Nurse Notes: none

## 2022-11-11 ENCOUNTER — Encounter: Payer: Self-pay | Admitting: Family Medicine

## 2022-11-11 ENCOUNTER — Ambulatory Visit (INDEPENDENT_AMBULATORY_CARE_PROVIDER_SITE_OTHER): Payer: Medicare Other | Admitting: Family Medicine

## 2022-11-11 VITALS — BP 126/66 | HR 86 | Temp 97.4°F | Ht 69.25 in | Wt 212.4 lb

## 2022-11-11 DIAGNOSIS — R7303 Prediabetes: Secondary | ICD-10-CM

## 2022-11-11 DIAGNOSIS — G894 Chronic pain syndrome: Secondary | ICD-10-CM

## 2022-11-11 DIAGNOSIS — Z0000 Encounter for general adult medical examination without abnormal findings: Secondary | ICD-10-CM

## 2022-11-11 DIAGNOSIS — E559 Vitamin D deficiency, unspecified: Secondary | ICD-10-CM

## 2022-11-11 DIAGNOSIS — I2510 Atherosclerotic heart disease of native coronary artery without angina pectoris: Secondary | ICD-10-CM

## 2022-11-11 DIAGNOSIS — G4733 Obstructive sleep apnea (adult) (pediatric): Secondary | ICD-10-CM

## 2022-11-11 DIAGNOSIS — N3941 Urge incontinence: Secondary | ICD-10-CM

## 2022-11-11 DIAGNOSIS — R053 Chronic cough: Secondary | ICD-10-CM

## 2022-11-11 DIAGNOSIS — N401 Enlarged prostate with lower urinary tract symptoms: Secondary | ICD-10-CM

## 2022-11-11 DIAGNOSIS — Z0001 Encounter for general adult medical examination with abnormal findings: Secondary | ICD-10-CM

## 2022-11-11 DIAGNOSIS — E669 Obesity, unspecified: Secondary | ICD-10-CM

## 2022-11-11 DIAGNOSIS — J849 Interstitial pulmonary disease, unspecified: Secondary | ICD-10-CM

## 2022-11-11 DIAGNOSIS — Z7189 Other specified counseling: Secondary | ICD-10-CM

## 2022-11-11 DIAGNOSIS — E785 Hyperlipidemia, unspecified: Secondary | ICD-10-CM

## 2022-11-11 DIAGNOSIS — B91 Sequelae of poliomyelitis: Secondary | ICD-10-CM

## 2022-11-11 DIAGNOSIS — K219 Gastro-esophageal reflux disease without esophagitis: Secondary | ICD-10-CM

## 2022-11-11 MED ORDER — TAMSULOSIN HCL 0.4 MG PO CAPS: 0.4000 mg | ORAL_CAPSULE | Freq: Every day | ORAL | 4 refills | Status: DC

## 2022-11-11 MED ORDER — MIRABEGRON ER 25 MG PO TB24
25.0000 mg | ORAL_TABLET | Freq: Every day | ORAL | 11 refills | Status: DC
Start: 2022-11-11 — End: 2023-07-27

## 2022-11-11 MED ORDER — OMEPRAZOLE 40 MG PO CPDR
40.0000 mg | DELAYED_RELEASE_CAPSULE | Freq: Two times a day (BID) | ORAL | 4 refills | Status: AC
Start: 2022-11-11 — End: ?

## 2022-11-11 MED ORDER — ROSUVASTATIN CALCIUM 40 MG PO TABS: 40.0000 mg | ORAL_TABLET | Freq: Every day | ORAL | 4 refills | Status: DC

## 2022-11-11 NOTE — Assessment & Plan Note (Addendum)
Has seen cardiology latest Dr Herbie Baltimore 10/2021.  Continue statin.

## 2022-11-11 NOTE — Assessment & Plan Note (Addendum)
Continue omeprazole 40mg  BID.

## 2022-11-11 NOTE — Assessment & Plan Note (Addendum)
Presenting with chronic cough and exertional dyspnea/hypoxia.  Marked deterioration noted after COVID infection, wife also things deterioration after he received RSV vaccine 12/2021. He was subsequently exposed to family members with RSV 05/2022.  Now established with pulmonology, working on getting set up for home oxygen and CPAP machine. Appreciate pulm care.  He recently had bronchoscopy with biopsies without obvious cause found.

## 2022-11-11 NOTE — Assessment & Plan Note (Signed)
Aware to limit added sugar/sweetened beverages in diet.

## 2022-11-11 NOTE — Assessment & Plan Note (Signed)
Encourage healthy diet and lifestyle to affect sustainable weight loss.  ?

## 2022-11-11 NOTE — Assessment & Plan Note (Signed)
Preventative protocols reviewed and updated unless pt declined. Discussed healthy diet and lifestyle.  

## 2022-11-11 NOTE — Assessment & Plan Note (Signed)
Chronic, longstanding.  Has agreed to retrial CPAP, in process of getting this set up.

## 2022-11-11 NOTE — Assessment & Plan Note (Signed)
Poliomyelitis sequelae.  Has established with pulmonology - see below.

## 2022-11-11 NOTE — Assessment & Plan Note (Signed)
Continues vit D 2000 IU daily with benefit.

## 2022-11-11 NOTE — Assessment & Plan Note (Signed)
Persistent since 03/2022. H/o ILD seeing pulm

## 2022-11-11 NOTE — Assessment & Plan Note (Signed)
Previously discussed.

## 2022-11-11 NOTE — Assessment & Plan Note (Signed)
Chronic, stable on rosuvastatin 40mg  followed by cardiology. The ASCVD Risk score (Arnett DK, et al., 2019) failed to calculate for the following reasons:   The patient has a prior MI or stroke diagnosis

## 2022-11-11 NOTE — Progress Notes (Signed)
Ph: [phone] Fax: [phone]   Patient ID: Alexander [name], male    DOB: July 01, 1950, 72 y.o.   MRN: [id number]  This visit was conducted in person.  BP 126/66   Pulse 86   Temp (!) 97.4 F (36.3 C) (Temporal)   Ht 5' 9.25" (1.759 m)   Wt 212 lb 6 oz (96.3 kg)   SpO2 94%   BMI 31.14 kg/m    CC: CPE Subjective:   HPI: Alexander [name] is a 72 y.o. male presenting on 11/11/2022 for Annual Exam (MCR prt 2 [AWV- 11/10/22]. Pt accompanied by wife, [name]. )   Saw health advisor yesterday for medicare wellness visit. Note reviewed.   No results found.  Flowsheet Row Clinical Support from 11/10/2022 in [facility] [place]  PHQ-2 Total Score 0          11/10/2022    9:00 AM 10/21/2021    9:06 AM 10/17/2021    5:19 PM 10/18/2020    2:57 PM 11/24/2018    9:19 AM  Fall Risk   Falls in the past year? 0 0 0 0 0  Number falls in past yr: 0 0     Injury with Fall? 0 0     Risk for fall due to : No Fall Risks Impaired balance/gait     Follow up Falls prevention discussed;Falls evaluation completed        Chronic cough since 03/2022 - he's been seeing [facility] pulmonology Dr Thora Lance s/p bronchoscopy with biopsy - has appt on Friday for follow up. H/o COVID last year with residual dyspnea. Cough may have started after RSV shot 01/2022. Has been ordered CPA for OSA through [facility] - has had significant trouble getting in touch with tem. Noticing exertional hypoxia - has had trouble getting home oxygen as he needs portable oxygen concentrator due to post-polio.   Chronic R ear tinnitus present for years, described as high pitched ring. No hearing changes.   H/o post polio syndrome /poliomyelitis sequelae with residual lower extremity (L>R) weakness/atrophy on disability, uses fitted lower extremity braces from hips to toes bilaterally regularly.    H/o recurrent ascending cholangitis s/p ERCP for choledocholithiasis removal and duodenal stricture dilation  latest 08/2021.   Preventative: COLONOSCOPY WITH ESOPHAGOGASTRODUODENOSCOPY (EGD) 06/2018 - reactive gastropathy, tubular adenoma, rpt colonoscopy 5 yrs ([facility]) Prostate cancer screening - continues yearly screening. Nocturia x2-3, continues flomax and myrbetriq.  Lung cancer screening - not eligible  Flu shot yearly COVID vaccine Pfizer 07/2019, 08/2019, booster 03/2020, 08/2020, planning bivalent Td 2013 Prevnar-13 2016, pnuemovax 2018.  zostavax - 06/2013 shingrix - 12/2021, 05/2022 RSV 12/2021 Advanced directive: scanned 07/2021. Wife [name] then son Earna Coder are HCPOA. Doesn't want prolonged life support if terminal condition. Doesn't think would want tube feed. Wants living will followed.  Seat belt use discussed Sunscreen use discussed. No changing moles on skin.  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 Interim medical history since our last visit reviewed. Allergies and medications reviewed and updated. Outpatient Medications Prior to Visit  Medication Sig Dispense Refill   acetaminophen (TYLENOL) 500 MG tablet Take 1,000 mg by mouth every 6 (six) hours as needed for moderate pain or headache.      Ascorbic Acid (VITAMIN C) 1000 MG tablet Take 1,000 mg by mouth daily.     Carboxymethylcellulose Sodium (LUBRICANT EYE DROPS OP) Place 2 drops into both eyes daily as needed (for dry eyes).     Cholecalciferol (VITAMIN  D3) 50 MCG (2000 UT) TABS Take 2,000 Units by mouth daily.     gabapentin (NEURONTIN) 600 MG tablet TAKE 2 TABLETS BY MOUTH 3 TIMES  DAILY 540 tablet 3   ibuprofen (ADVIL) 200 MG tablet Take 400 mg by mouth every 6 (six) hours as needed for headache or moderate pain.     Magnesium 250 MG TABS Take 1 tablet (250 mg total) by mouth at bedtime. (Patient taking differently: Take 250 mg by mouth at bedtime.)  0   Omega-3 Fatty Acids (FISH OIL) 1000 MG CAPS Take 1,000 mg by mouth daily.      sildenafil (VIAGRA) 100 MG tablet TAKE 1/2 TO 1 TABLET BY MOUTH DAILY AS NEEDED FOR ERECTILE DYSFUNCTION (Patient taking differently: Take 50-100 mg by mouth daily as needed for erectile dysfunction.) 6 tablet 0   vitamin B-12 (CYANOCOBALAMIN) 1000 MCG tablet Take 1,000 mcg by mouth daily.     vitamin E 400 UNIT capsule Take 400 Units by mouth daily.     mirabegron ER (MYRBETRIQ) 25 MG TB24 tablet TAKE 1 TABLET (25 MG TOTAL) BY MOUTH DAILY. 90 tablet 1   omeprazole (PRILOSEC) 40 MG capsule TAKE 1 CAPSULE (40 MG TOTAL) BY MOUTH 2 (TWO) TIMES DAILY BEFORE A MEAL. 180 capsule 0   rosuvastatin (CRESTOR) 40 MG tablet TAKE 1 TABLET BY MOUTH DAILY 90 tablet 0   tamsulosin (FLOMAX) 0.4 MG CAPS capsule TAKE 1 CAPSULE BY MOUTH EVERY DAY 90 capsule 3   No facility-administered medications prior to visit.     Per HPI unless specifically indicated in ROS section below Review of Systems  Constitutional:  Positive for fatigue. Negative for activity change, appetite change, chills, fever and unexpected weight change.  HENT:  Negative for hearing loss.   Eyes:  Negative for visual disturbance.  Respiratory:  Positive for cough and shortness of breath. Negative for chest tightness and wheezing.   Cardiovascular:  Negative for chest pain, palpitations and leg swelling.  Gastrointestinal:  Negative for abdominal distention, abdominal pain, blood in stool, constipation, diarrhea, nausea and vomiting.  Genitourinary:  Positive for  difficulty urinating and frequency. Negative for hematuria.  Musculoskeletal:  Positive for back pain. Negative for arthralgias, myalgias and neck pain.  Skin:  Negative for rash.  Neurological:  Negative for dizziness, seizures, syncope and headaches.  Hematological:  Negative for adenopathy. Does not bruise/bleed easily.  Psychiatric/Behavioral:  Negative for dysphoric mood. The patient is not nervous/anxious.     Objective:  BP 126/66   Pulse 86   Temp (!) 97.4 F (36.3 C) (Temporal)   Ht 5' 9.25" (1.759 m)   Wt 212 lb 6 oz (96.3 kg)   SpO2 94%   BMI 31.14 kg/m   Wt Readings from Last 3 Encounters:  11/11/22 212 lb 6 oz (96.3 kg)  11/10/22 205 lb (93 kg)  10/29/22 215 lb (97.5 kg)      Physical Exam Vitals and  nursing note reviewed.  Constitutional:      General: He is not in acute distress.    Appearance: Normal appearance. He is well-developed. He is not ill-appearing.  HENT:     Head: Normocephalic and atraumatic.     Right Ear: Hearing, tympanic membrane, ear canal and external ear normal.     Left Ear: Hearing, tympanic membrane, ear canal and external ear normal.  Eyes:     General: No scleral icterus.    Extraocular Movements: Extraocular movements intact.     Conjunctiva/sclera: Conjunctivae normal.     Pupils: Pupils are equal, round, and reactive to light.  Neck:     Thyroid: No thyroid mass or thyromegaly.  Cardiovascular:     Rate and Rhythm: Normal rate and regular rhythm.     Pulses: Normal pulses.          Radial pulses are 2+ on the right side and 2+ on the left side.     Heart sounds: Normal heart sounds. No murmur heard. Pulmonary:     Effort: Pulmonary effort is normal. No respiratory distress.     Breath sounds: No wheezing, rhonchi or rales.     Comments: Marked coarse crackles throughout both lungs Abdominal:     General: Bowel sounds are normal. There is no distension.     Palpations: Abdomen is soft. There is no mass.     Tenderness:  There is no abdominal tenderness. There is no guarding or rebound.     Hernia: No hernia is present.  Musculoskeletal:        General: Normal range of motion.     Cervical back: Normal range of motion and neck supple.     Right lower leg: No edema.     Left lower leg: No edema.     Comments: Leg weakness present, uses knee high braces.   Lymphadenopathy:     Cervical: No cervical adenopathy.  Skin:    General: Skin is warm and dry.     Findings: No rash.  Neurological:     General: No focal deficit present.     Mental Status: He is alert and oriented to person, place, and time.  Psychiatric:        Mood and Affect: Mood normal.        Behavior: Behavior normal.        Thought Content: Thought content normal.        Judgment: Judgment normal.       Results for orders placed or performed in visit on 11/04/22  PSA  Result Value Ref Range   PSA 2.35 0.10 - 4.00 ng/mL  VITAMIN D 25 Hydroxy (Vit-D Deficiency, Fractures)  Result Value Ref Range   VITD 41.79 30.00 - 100.00 ng/mL  Hemoglobin A1c  Result Value Ref Range   Hgb A1c MFr Bld 5.9 4.6 - 6.5 %  Comprehensive metabolic panel  Result Value Ref Range   Sodium 140 135 - 145 mEq/L   Potassium 4.2 3.5 - 5.1 mEq/L   Chloride 103 96 - 112 mEq/L   CO2 25 19 - 32 mEq/L   Glucose, Bld 108 (H) 70 - 99 mg/dL   BUN 19 6 - 23 mg/dL   Creatinine, Ser 4.09 0.40 - 1.50 mg/dL   Total Bilirubin 0.5 0.2 - 1.2 mg/dL   Alkaline Phosphatase 51 39 - 117 U/L   AST 15 0 - 37 U/L   ALT 17 0 - 53 U/L   Total Protein 7.4  6.0 - 8.3 g/dL   Albumin 4.3 3.5 - 5.2 g/dL   GFR 40.98 >11.91 mL/min   Calcium 9.2 8.4 - 10.5 mg/dL  Lipid panel  Result Value Ref Range   Cholesterol 107 0 - 200 mg/dL   Triglycerides 47.8 0.0 - 149.0 mg/dL   HDL 29.56 >21.30 mg/dL   VLDL 86.5 0.0 - 78.4 mg/dL   LDL Cholesterol 43 0 - 99 mg/dL   Total CHOL/HDL Ratio 2    NonHDL 62.48     Assessment & Plan:   Problem List Items Addressed This Visit     Muscle  weakness following poliomyelitis (Chronic)    Poliomyelitis sequelae.  Has established with pulmonology - see below.       Hyperlipidemia with target LDL less than 70 (Chronic)    Chronic, stable on rosuvastatin 40mg  followed by cardiology. The ASCVD Risk score (Arnett DK, et al., 2019) failed to calculate for the following reasons:   The patient has a prior MI or stroke diagnosis       Relevant Medications   rosuvastatin (CRESTOR) 40 MG tablet   Advanced care planning/counseling discussion (Chronic)    Previously discussed.       CAD S/P percutaneous coronary angioplasty (Chronic)    Has seen cardiology latest Dr Herbie Baltimore 10/2021.  Continue statin.       Relevant Medications   rosuvastatin (CRESTOR) 40 MG tablet   Interstitial lung disease (HCC) (Chronic)    Presenting with chronic cough and exertional dyspnea/hypoxia.  Marked deterioration noted after COVID infection, wife also things deterioration after he received RSV vaccine 12/2021. He was subsequently exposed to family members with RSV 05/2022.  Now established with pulmonology, working on getting set up for home oxygen and CPAP machine. Appreciate pulm care.  He recently had bronchoscopy with biopsies without obvious cause found.       Encounter for general adult medical examination with abnormal findings - Primary (Chronic)    Preventative protocols reviewed and updated unless pt declined. Discussed healthy diet and lifestyle.       Chronic pain syndrome     Has spinal cord stimulator in place.  Continue high dose gabapentin.       OSA (obstructive sleep apnea)    Chronic, longstanding.  Has agreed to retrial CPAP, in process of getting this set up.       Prediabetes    Aware to limit added sugar/sweetened beverages in diet.       Vitamin D deficiency    Continues vit D 2000 IU daily with benefit.       Obesity, Class I, BMI 30-34.9    Encourage healthy diet and lifestyle to affect sustainable weight loss.        GERD (gastroesophageal reflux disease)    Continue omeprazole 40mg  BID.       Relevant Medications   omeprazole (PRILOSEC) 40 MG capsule   BPH (benign prostatic hyperplasia)    Continue flomax, myrbetriq      Relevant Medications   tamsulosin (FLOMAX) 0.4 MG CAPS capsule   Urinary incontinence    Stable period on flomax and myrbetriq - continue this.       Relevant Medications   mirabegron ER (MYRBETRIQ) 25 MG TB24 tablet   tamsulosin (FLOMAX) 0.4 MG CAPS capsule   Chronic cough    Persistent since 03/2022. H/o ILD seeing pulm         Meds ordered this encounter  Medications   mirabegron ER (MYRBETRIQ) 25 MG TB24  tablet    Sig: Take 1 tablet (25 mg total) by mouth daily.    Dispense:  30 tablet    Refill:  11   omeprazole (PRILOSEC) 40 MG capsule    Sig: Take 1 capsule (40 mg total) by mouth 2 (two) times daily before a meal.    Dispense:  180 capsule    Refill:  4   rosuvastatin (CRESTOR) 40 MG tablet    Sig: Take 1 tablet (40 mg total) by mouth daily.    Dispense:  90 tablet    Refill:  4   tamsulosin (FLOMAX) 0.4 MG CAPS capsule    Sig: Take 1 capsule (0.4 mg total) by mouth daily.    Dispense:  90 capsule    Refill:  4    No orders of the defined types were placed in this encounter.   Patient Instructions  Good to see you today Meds refilled today  Keep follow up with lung doctor for interstitial lung disease Return as needed or in 1 year for next physical/wellness visit  Follow up plan: Return in about 1 year (around 11/11/2023) for annual exam, prior fasting for blood work.  Eustaquio Boyden, MD

## 2022-11-11 NOTE — Patient Instructions (Addendum)
Good to see you today Meds refilled today  Keep follow up with lung doctor for interstitial lung disease Return as needed or in 1 year for next physical/wellness visit

## 2022-11-11 NOTE — Assessment & Plan Note (Addendum)
Has spinal cord stimulator in place.  Continue high dose gabapentin.

## 2022-11-11 NOTE — Assessment & Plan Note (Signed)
Stable period on flomax and myrbetriq - continue this.

## 2022-11-11 NOTE — Assessment & Plan Note (Signed)
Continue flomax, myrbetriq

## 2022-11-13 ENCOUNTER — Encounter: Payer: Self-pay | Admitting: Student

## 2022-11-13 ENCOUNTER — Ambulatory Visit: Payer: Medicare Other | Admitting: Student

## 2022-11-13 VITALS — BP 126/72 | HR 78 | Temp 98.4°F | Ht 69.25 in | Wt 213.8 lb

## 2022-11-13 DIAGNOSIS — G4733 Obstructive sleep apnea (adult) (pediatric): Secondary | ICD-10-CM | POA: Diagnosis not present

## 2022-11-13 DIAGNOSIS — J849 Interstitial pulmonary disease, unspecified: Secondary | ICD-10-CM | POA: Diagnosis not present

## 2022-11-13 DIAGNOSIS — R053 Chronic cough: Secondary | ICD-10-CM | POA: Diagnosis not present

## 2022-11-13 MED ORDER — PREDNISONE 10 MG PO TABS: ORAL_TABLET | ORAL | 0 refills | Status: DC

## 2022-11-13 NOTE — Patient Instructions (Addendum)
-   Start prednisone 30 mg daily - see instructions for taper  - Spirometry next visit - see you in 2-3 months or sooner if need be!

## 2022-11-16 DIAGNOSIS — J849 Interstitial pulmonary disease, unspecified: Secondary | ICD-10-CM | POA: Diagnosis not present

## 2022-11-27 ENCOUNTER — Telehealth: Payer: Self-pay | Admitting: Internal Medicine

## 2022-11-27 DIAGNOSIS — J849 Interstitial pulmonary disease, unspecified: Secondary | ICD-10-CM

## 2022-11-27 DIAGNOSIS — G4733 Obstructive sleep apnea (adult) (pediatric): Secondary | ICD-10-CM

## 2022-11-27 NOTE — Telephone Encounter (Signed)
Been having trouble with [facility], needs a prescription for POC and CPAP machine.

## 2022-12-01 LAB — FUNGUS CULTURE WITH STAIN

## 2022-12-01 LAB — FUNGAL ORGANISM REFLEX

## 2022-12-02 NOTE — Telephone Encounter (Addendum)
Spoke with patient's wife Alexander [name] regarding prior message. Alexander [name] would like for patient to get CPAP and POC from [place] .  Erin Sons I will put in a order .  Nothing else further needed.

## 2022-12-10 DIAGNOSIS — G4733 Obstructive sleep apnea (adult) (pediatric): Secondary | ICD-10-CM | POA: Diagnosis not present

## 2022-12-10 DIAGNOSIS — J849 Interstitial pulmonary disease, unspecified: Secondary | ICD-10-CM | POA: Diagnosis not present

## 2022-12-10 DIAGNOSIS — J9611 Chronic respiratory failure with hypoxia: Secondary | ICD-10-CM | POA: Diagnosis not present

## 2022-12-15 LAB — ACID FAST CULTURE WITH REFLEXED SENSITIVITIES (MYCOBACTERIA): Acid Fast Culture: NEGATIVE

## 2022-12-25 ENCOUNTER — Telehealth: Payer: Self-pay | Admitting: Internal Medicine

## 2022-12-25 MED ORDER — PREDNISONE 10 MG PO TABS: ORAL_TABLET | ORAL | 0 refills | Status: DC

## 2022-12-25 NOTE — Telephone Encounter (Signed)
Prednisone refill sent to [facility]. Nothing further needed.

## 2022-12-25 NOTE — Telephone Encounter (Signed)
Patient was prescribed prednisone "Take 3 tablets daily for 8 weeks, two tablets daily for 2 weeks then 1 tablet daily till next visit" However only 150 pills were given which is not enough to last through the 8 weeks- patient is running very low. Please call in additional pills to [facility] on [address] and call patient to confirm this has been done.

## 2022-12-30 DIAGNOSIS — H5213 Myopia, bilateral: Secondary | ICD-10-CM | POA: Diagnosis not present

## 2022-12-30 DIAGNOSIS — H40033 Anatomical narrow angle, bilateral: Secondary | ICD-10-CM | POA: Diagnosis not present

## 2023-01-10 DIAGNOSIS — J9611 Chronic respiratory failure with hypoxia: Secondary | ICD-10-CM | POA: Diagnosis not present

## 2023-01-10 DIAGNOSIS — J849 Interstitial pulmonary disease, unspecified: Secondary | ICD-10-CM | POA: Diagnosis not present

## 2023-01-10 DIAGNOSIS — G4733 Obstructive sleep apnea (adult) (pediatric): Secondary | ICD-10-CM | POA: Diagnosis not present

## 2023-01-22 ENCOUNTER — Other Ambulatory Visit: Payer: Self-pay | Admitting: Family Medicine

## 2023-01-28 ENCOUNTER — Ambulatory Visit: Payer: Medicare Other | Admitting: Internal Medicine

## 2023-01-28 ENCOUNTER — Telehealth: Payer: Self-pay | Admitting: Internal Medicine

## 2023-01-28 ENCOUNTER — Other Ambulatory Visit: Payer: Self-pay | Admitting: Pharmacist

## 2023-01-28 ENCOUNTER — Encounter: Payer: Self-pay | Admitting: Internal Medicine

## 2023-01-28 VITALS — BP 122/64 | HR 80 | Temp 98.2°F | Ht 70.0 in | Wt 206.0 lb

## 2023-01-28 DIAGNOSIS — Z77120 Contact with and (suspected) exposure to mold (toxic): Secondary | ICD-10-CM

## 2023-01-28 DIAGNOSIS — J849 Interstitial pulmonary disease, unspecified: Secondary | ICD-10-CM

## 2023-01-28 DIAGNOSIS — R0609 Other forms of dyspnea: Secondary | ICD-10-CM

## 2023-01-28 DIAGNOSIS — R053 Chronic cough: Secondary | ICD-10-CM

## 2023-01-28 DIAGNOSIS — R0902 Hypoxemia: Secondary | ICD-10-CM | POA: Diagnosis not present

## 2023-01-28 DIAGNOSIS — J679 Hypersensitivity pneumonitis due to unspecified organic dust: Secondary | ICD-10-CM

## 2023-01-28 NOTE — Progress Notes (Signed)
71yM with history of CAD, HTN, ICM, OSA not on CPAP, pAF, very light smoking in past. Had covid-19 a year ago in January was not hospitalized with it.   Coughing, short of breath, little energy over last 4 months. Cough is dry occasionally productive of sputum but he just swallows it. He does have sinus congestion and postnasal drainage. He tried flonase on and off since November. Benadryl here and there. He is on omeprazole BID for reflux. No solid/liquid food dysphagia.   Got steroid course in October/Nov, steroids did not help. Had another round of steroids/antibiotic which did not help.   Had polio as a kid and started to need braces/crutches in 2002.   Some morning stiffness but takes 10-15 min to loosen up, no rashes, raynaud's phenomenon, ulcers in mouth or nose.   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 Some exposure to cleaning agents in past, some solvents. No MJ, vaping. Born in [place], lived for 40y in [place] and then was in [place] in [place], moved to [place]. 3 dogs. No hot tub.   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 Thora Lance  Time of Conference: 7.00am- 8.00am Date of conference: 09/08/2022 Location of Conference: -  Virtual  Participating Pulmonary: Dr. Kalman Shan, Dr Chilton Greathouse, Dr. Felisa Bonier Pathology: Dr Holley Bouche Radiology: Dr Lauralyn Primes  Others:   Brief History: 72 year old was on amio for 6 months. Has post polio syndrome. Has severe GERD . [place] a LOT of GARDENING. When lived in [place] it was damp. Serology negative. 5-6 months of symptoms. HAd mild covid a  year ago. Does this seem to be consistent with chronic hp from dust exposure enough to treat empirically?   Should I alternatively send for surgical lung biopsy or instead consider bronchoscopy/BAL for cell count/diff first?   MDD discussion of CT scan    - Date or time period of scan: HRCT: 07/24/2022   - Discussion synopsis:  clearest case of alternative diagnosis. Has quite a bit of traction bronchiectasis.  There is lot of air trapping . Upper lobe predminant.   - What is the final conclusion per 2018 ATS/Fleischner Criteria - Leading differential is Chronic HP  - Concordance with official report: CONCORDANT  Pathology discussion of biopsy: No lung biopsy    MDD Impression/Recs: Suspect chronic HP ., REC a) send HP panel; b) ILD questionnaire; c) shared decision making for bronch BAL/Ttbx for histopath and envisia  OV May 2024   Interval HPI:  ILD conference recommends shared decision-making discussion wrt bronch/BAL/TBLB  PSG with moderate OSA AHI of 20 and SpO2 low of 75%  Does wear mask when he heads out to do yardwork.  Overall feels more energetic than at last visit.   Otherwise pertinent review of systems is negative.  xxxxxxxxxxxxxxxxxxxxxxxxxxxxxxxxxxxx[id number] Interval HPI: June 2024  Underwent bronch/BAL/tblb 5/30. BAL diff with 18% lymphs, 11% eos.  NEGATIVE ENVISIA. Non-diagnostic TTBX for Histo pat  Gabapentin, ibuprofen with case report of eosinophilic pna but not this pattern Rosuvastatin with ILD but not this pattern  Overall still with a lack of energy. No change in DOE, cough. Has not yet set up appointment to get  autoPAP, playing phone tag with [employer].   Otherwise pertinent review of systems is negative.  - Start prednisone 30 mg daily - see instructions for taper  - Spirometry next visit - see you in 2-3 months or sooner if need be!  OV 01/28/2023 -transfer of care from Dr. Felisa Bonier to Dr. Marchelle Gearing in the ILD center.  Subjective:  Patient ID: Alexander [name], male , DOB: [birth year] , age 72 y.o. , MRN: [id number] , ADDRESS: [address] [place] [address] PCP Eustaquio Boyden, MD Patient Care Team: Eustaquio Boyden, MD as PCP - General (Family Medicine) Marykay Lex, MD as PCP - Cardiology (Cardiology) Marykay Lex, MD as Consulting Physician (Cardiology) Merwyn Katos, MD (Inactive) as Consulting Physician (Pulmonary Disease)  This Provider for this visit: Treatment Team:  Attending Provider: Kalman Shan, MD    01/28/2023 -   Chief Complaint  Patient presents with   Consult    Former pt of Dr. Thora Lance.  Discuss labs and cxr/scan   Interstitial lung disease chronic hypersensitive pneumonitis Okay on prednisone since mid June 2024  HPI Alexander [name] 72 y.o. -presents for follow-up.  History is provided by reviewing of the prior records and also talking to his wife and talking to him   #background: He grew up in [place].  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  After that he recovered he just walked with a limp.  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  He also then worked for [employer]  during the same time.[employer] [employer]  He is to smoke occasionally during this time.  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  No visible mold.  He then retired and then moved to [place] because his wife is originally from [place] and his sister-in-law lives in this area.  In the last few years his  polio has flared up and is now using crutches.  #ILD reviewed the CT scans of the chest suggest that he had ILD even in 2016 and even in  and 2022.  In early 2023 he suffered from COVID-19.  Then by October 2023 started having a cough and then established with Dr. Felisa Bonier in early 2024.  In my personal visualization the ILD has progressed.  In conference they have raised the specter of hypersensitive pneumonitis because of air trapping and the features.  I personally agree with this.  His main symptoms was cough followed by shortness of breath.  During this time he had a bronchoscopy with lavage is mixed cellularity.  Nondiagnostic on RNA genomic classifier and histopathology transbronchial biopsies.  However this does not preclude chronic HP.  His serologies are essentially normal.  By mid June 2024 after the ILD conference started on prednisone 30 mg/day.  He slowly taper this and currently on 10 mg.  The cough resolved but according to his wife is coming back now at  the lower dose of prednisone.  Uses oxygen for the last 1 month.  Today room air at rest was 94%.   SYMPTOM SCALE - ILD 01/28/2023  Current weight Ra 94%  O2 use 2L  Shortness of Breath 0 -> 5 scale with 5 being worst (score 6 If unable to do)  At rest 3  Simple tasks - showers, clothes change, eating, shaving 3  Household (dishes, doing bed, laundry) 3  Shopping 4  Walking level at own pace 5  Walking up Stairs 5  Total (30-36) Dyspnea Score 23  How bad is your cough? 3  How bad is your fatigue 3  How bad is nausea 0  How bad is vomiting?  0  How bad is diarrhea? 0  How bad is anxiety? 0  How bad is depression 0  Any chronic pain - if so where and how bad 0       PFT    Latest Ref Rng & Units 08/21/2022   10:42 AM 08/01/2015    2:22 PM  PFT Results  FVC-Pre L 2.42  5.42  P  FVC-Predicted Pre % 55  113  P  FVC-Post L 2.12  5.35  P  FVC-Predicted Post % 48  112  P  Pre FEV1/FVC % % 84  85  P  Post FEV1/FCV  % % 90  87  P  FEV1-Pre L 2.04  4.58  P  FEV1-Predicted Pre % 64  129  P  FEV1-Post L 1.92  4.65  P  DLCO uncorrected ml/min/mmHg 17.86  20.37  P  DLCO UNC% % 69  60  P  DLCO corrected ml/min/mmHg 17.86    DLCO COR %Predicted % 69    DLVA Predicted % 93  57  P  TLC L 4.08  7.55  P  TLC % Predicted % 58  104  P  RV % Predicted % 59  82  P    P Preliminary result       LAB RESULTS last 96 hours No results found.  LAB RESULTS last 90 days Recent Results (from the past 2160 hour(s))  PSA     Status: None   Collection Time: 11/04/22  8:28 AM  Result Value Ref Range   PSA 2.35 0.10 - 4.00 ng/mL    Comment: Test performed using Access Hybritech PSA Assay, a parmagnetic partical, chemiluminecent immunoassay.  VITAMIN D 25 Hydroxy (Vit-D Deficiency, Fractures)     Status: None   Collection Time: 11/04/22  8:28 AM  Result Value Ref Range   VITD 41.79 30.00 - 100.00 ng/mL  Hemoglobin A1c     Status: None   Collection Time: 11/04/22  8:28 AM  Result Value Ref Range   Hgb A1c MFr Bld 5.9 4.6 - 6.5 %    Comment: Glycemic Control Guidelines for People with Diabetes:Non Diabetic:  <6%Goal of Therapy: <7%Additional Action Suggested:  >8%   Comprehensive metabolic panel     Status: Abnormal   Collection Time: 11/04/22  8:28 AM  Result Value Ref Range   Sodium 140 135 - 145 mEq/L   Potassium 4.2 3.5 - 5.1 mEq/L   Chloride 103 96 - 112 mEq/L   CO2 25 19 - 32 mEq/L   Glucose, Bld 108 (H) 70 - 99 mg/dL   BUN 19 6 - 23 mg/dL   Creatinine, Ser 1.61 0.40 - 1.50 mg/dL   Total Bilirubin 0.5 0.2 - 1.2 mg/dL   Alkaline Phosphatase 51 39 - 117  U/L   AST 15 0 - 37 U/L   ALT 17 0 - 53 U/L   Total Protein 7.4 6.0 - 8.3 g/dL   Albumin 4.3 3.5 - 5.2 g/dL   GFR 40.98 >11.91 mL/min    Comment: Calculated using the CKD-EPI Creatinine Equation (2021)   Calcium 9.2 8.4 - 10.5 mg/dL  Lipid panel     Status: None   Collection Time: 11/04/22  8:28 AM  Result Value Ref Range   Cholesterol 107 0 - 200  mg/dL    Comment: ATP III Classification       Desirable:  < 200 mg/dL               Borderline High:  200 - 239 mg/dL          High:  > = 478 mg/dL   Triglycerides 29.5 0.0 - 149.0 mg/dL    Comment: Normal:  <621 mg/dLBorderline High:  150 - 199 mg/dL   HDL 30.86 >57.84 mg/dL   VLDL 69.6 0.0 - 29.5 mg/dL   LDL Cholesterol 43 0 - 99 mg/dL   Total CHOL/HDL Ratio 2     Comment:                Men          Women1/2 Average Risk     3.4          3.3Average Risk          5.0          4.42X Average Risk          9.6          7.13X Average Risk          15.0          11.0                       NonHDL 62.48     Comment: NOTE:  Non-HDL goal should be 30 mg/dL higher than patient's LDL goal (i.e. LDL goal of < 70 mg/dL, would have non-HDL goal of < 100 mg/dL)         has a past medical history of Acute cholecystitis (03/2016), Arthritis, CAD S/P percutaneous coronary angioplasty (1995, 2000, 2005), Calculus of bile duct with acute cholangitis with obstruction, Chronic pain syndrome, Complication of anesthesia (04/01/2021), Duodenal stricture, Essential hypertension, Fatty liver, Gallstones, GERD (gastroesophageal reflux disease), History of depression, History of post poliomyelitis muscular atrophy, HLD (hyperlipidemia), Ischemic cardiomyopathy, Migraines, Muscle weakness following poliomyelitis, Neuromuscular disorder (HCC), Neuropathy, OSA (obstructive sleep apnea), PAF (paroxysmal atrial fibrillation) (HCC), Pneumonia (04/2015), Septic shock (HCC) (01/07/2018), and Shoulder pain, right.   reports that he quit smoking about 52 years ago. His smoking use included cigarettes. He has never used smokeless tobacco.  Past Surgical History:  Procedure Laterality Date   BALLOON DILATION N/A 06/06/2018   Procedure: BALLOON DILATION;  Surgeon: Mansouraty, Netty Starring., MD;  Location: [facility] ENDOSCOPY;  Service: Gastroenterology;  Laterality: N/A;   BALLOON DILATION N/A 07/06/2018   Procedure: BALLOON DILATION;   Surgeon: Meridee Score Netty Starring., MD;  Location: [facility] ENDOSCOPY;  Service: Gastroenterology;  Laterality: N/A;   BALLOON DILATION N/A 07/24/2021   Procedure: BALLOON DILATION;  Surgeon: Meridee Score Netty Starring., MD;  Location: [facility] ENDOSCOPY;  Service: Gastroenterology;  Laterality: N/A;   BILIARY BRUSHING  06/05/2021   Procedure: BILIARY BRUSHING;  Surgeon: Meridee Score Netty Starring., MD;  Location: [facility] ENDOSCOPY;  Service: Gastroenterology;;   BILIARY DILATION  06/05/2021   Procedure: BILIARY DILATION;  Surgeon: Meridee Score Netty Starring., MD;  Location: [facility] ENDOSCOPY;  Service: Gastroenterology;;   BILIARY DILATION  09/15/2021   Procedure: BILIARY DILATION;  Surgeon: Lemar Lofty., MD;  Location: [facility] ENDOSCOPY;  Service: Gastroenterology;;   BILIARY STENT PLACEMENT  01/08/2018   Procedure: BILIARY STENT PLACEMENT;  Surgeon: Lynann Bologna, MD;  Location: [facility] ENDOSCOPY;  Service: Endoscopy;;   BILIARY STENT PLACEMENT N/A 02/09/2018   Procedure: BILIARY STENT PLACEMENT;  Surgeon: Lemar Lofty., MD;  Location: [facility] ENDOSCOPY;  Service: Gastroenterology;  Laterality: N/A;   BILIARY STENT PLACEMENT  04/01/2021   Procedure: BILIARY STENT PLACEMENT;  Surgeon: Lynann Bologna, MD;  Location[facility] ENDOSCOPY;  Service: Endoscopy;;   BIOPSY  06/06/2018   Procedure: BIOPSY;  Surgeon: Lemar Lofty., MD;  Location: [facility] ENDOSCOPY;  Service: Gastroenterology;;   BIOPSY  04/01/2021   Procedure: BIOPSY;  Surgeon: Lynann Bologna, MD;  Location: [facility] ENDOSCOPY;  Service: Endoscopy;;   BIOPSY  06/05/2021   Procedure: BIOPSY;  Surgeon: Lemar Lofty., MD;  Location: [facility] ENDOSCOPY;  Service: Gastroenterology;;   BRONCHIAL BIOPSY  10/29/2022   Procedure: BRONCHIAL BIOPSIES;  Surgeon: [name], MD;  Location: [facility] ENDOSCOPY;  Service: Pulmonary;;   BRONCHIAL WASHINGS  10/29/2022   Procedure: BRONCHIAL WASHINGS;  Surgeon: [name], MD;  Location: [facility] ENDOSCOPY;  Service: Pulmonary;;    BRONCHOSCOPY  09/2022   benign biopsies, negative for eosinophils, granuloma, fungal (Meier)   CARDIAC CATHETERIZATION N/A 04/30/2015   Procedure: Left Heart Cath and Coronary Angiography;  Surgeon: Lennette Bihari, MD;  Location: [facility] INVASIVE CV LAB;  Service: Cardiovascular; LM nl, ost/p LAD 20%, patent mLAD stent, RI small, OM2 patent stent, pRCA 20%, patent stent, EF 45-50%-->Med Rx    cervical spine stimulator  08/2012   CHOLECYSTECTOMY  06/10/2015   Procedure: LAPAROSCOPIC CHOLECYSTECTOMY;  Surgeon: De Blanch Kinsinger, MD;  Location: [facility] OR;  Service: General;;   COLONOSCOPY  09/2013   TA x2, rpt 5 yrs (Pyrtle)   COLONOSCOPY WITH ESOPHAGOGASTRODUODENOSCOPY (EGD)  06/2018   reactive gastropathy, tubular adenoma, rpt colonoscopy 5 yrs (Mansouraty)   COLONOSCOPY WITH PROPOFOL N/A 06/06/2018   Procedure: COLONOSCOPY WITH PROPOFOL;  Surgeon: Lemar Lofty., MD;  Location: [facility] ENDOSCOPY;  Service: Gastroenterology;  Laterality: N/A;   CORONARY ANGIOPLASTY WITH STENT PLACEMENT  1995, 2000, 2005   stents in RCA, LAD & Cx-OM. -Stents were patent by cardiac catheterization in November 2016   ENDOSCOPIC RETROGRADE CHOLANGIOPANCREATOGRAPHY (ERCP) WITH PROPOFOL N/A 02/09/2018   Procedure: ENDOSCOPIC RETROGRADE CHOLANGIOPANCREATOGRAPHY (ERCP) WITH PROPOFOL;  Surgeon: Lemar Lofty., MD;  Location: [facility] ENDOSCOPY;  Service: Gastroenterology;  Laterality: N/A;   ENDOSCOPIC RETROGRADE CHOLANGIOPANCREATOGRAPHY (ERCP) WITH PROPOFOL N/A 03/30/2018   Procedure: ENDOSCOPIC RETROGRADE CHOLANGIOPANCREATOGRAPHY (ERCP) WITH PROPOFOL;  Surgeon: Meridee Score Netty Starring., MD;  Location: [facility] ENDOSCOPY;  Service: Gastroenterology;  Laterality: N/A;   ENDOSCOPIC RETROGRADE CHOLANGIOPANCREATOGRAPHY (ERCP) WITH PROPOFOL N/A 06/05/2021   Procedure: ENDOSCOPIC RETROGRADE CHOLANGIOPANCREATOGRAPHY (ERCP) WITH PROPOFOL;  Surgeon: Meridee Score Netty Starring., MD;  Location: [facility] ENDOSCOPY;  Service: Gastroenterology;   Laterality: N/A;   ENDOSCOPIC RETROGRADE CHOLANGIOPANCREATOGRAPHY (ERCP) WITH PROPOFOL N/A 09/15/2021   Procedure: ENDOSCOPIC RETROGRADE CHOLANGIOPANCREATOGRAPHY (ERCP) WITH PROPOFOL;  Surgeon: Meridee Score Netty Starring., MD;  Location: [facility] ENDOSCOPY;  Service: Gastroenterology;  Laterality: N/A;   ERCP N/A 01/08/2018   Procedure: ENDOSCOPIC RETROGRADE CHOLANGIOPANCREATOGRAPHY (ERCP);  Surgeon: Lynann Bologna, MD;  Location: [facility] ENDOSCOPY;  Service: Endoscopy;  Laterality: N/A;   ERCP N/A 04/01/2021   Procedure: ENDOSCOPIC RETROGRADE CHOLANGIOPANCREATOGRAPHY (  ERCP);  Surgeon: Lynann Bologna, MD;  Location: [facility] ENDOSCOPY;  Service: Endoscopy;  Laterality: N/A;  Please schedule after 1 PM   ESOPHAGOGASTRODUODENOSCOPY N/A 03/30/2018   Procedure: ESOPHAGOGASTRODUODENOSCOPY (EGD);  Surgeon: Lemar Lofty., MD;  Location: [facility] ENDOSCOPY;  Service: Gastroenterology;  Laterality: N/A;   ESOPHAGOGASTRODUODENOSCOPY (EGD) WITH PROPOFOL N/A 06/06/2018   Procedure: ESOPHAGOGASTRODUODENOSCOPY (EGD) WITH PROPOFOL;  Surgeon: Meridee Score Netty Starring., MD;  Location: [facility] ENDOSCOPY;  Service: Gastroenterology;  Laterality: N/A;   ESOPHAGOGASTRODUODENOSCOPY (EGD) WITH PROPOFOL N/A 07/06/2018   Procedure: ESOPHAGOGASTRODUODENOSCOPY (EGD) WITH PROPOFOL;  Surgeon: Meridee Score Netty Starring., MD;  Location: [facility] ENDOSCOPY;  Service: Gastroenterology;  Laterality: N/A;   ESOPHAGOGASTRODUODENOSCOPY (EGD) WITH PROPOFOL N/A 07/24/2021   Procedure: ESOPHAGOGASTRODUODENOSCOPY (EGD) WITH PROPOFOL WITH FLUOROSCOPY;  Surgeon: Meridee Score Netty Starring., MD;  Location: [facility] ENDOSCOPY;  Service: Gastroenterology;  Laterality: N/A;   EUS N/A 07/24/2021   Procedure: UPPER ENDOSCOPIC ULTRASOUND (EUS) RADIAL;  Surgeon: Lemar Lofty., MD;  Location: [facility] ENDOSCOPY;  Service: Gastroenterology;  Laterality: N/A;   lumbar spine stimulator     POLYPECTOMY  06/06/2018   Procedure: POLYPECTOMY;  Surgeon: Mansouraty, Netty Starring., MD;  Location: [facility]  ENDOSCOPY;  Service: Gastroenterology;;   REMOVAL OF STONES  03/30/2018   Procedure: REMOVAL OF STONES;  Surgeon: Lemar Lofty., MD;  Location: [facility] ENDOSCOPY;  Service: Gastroenterology;;   REMOVAL OF STONES  06/05/2021   Procedure: REMOVAL OF STONES;  Surgeon: Lemar Lofty., MD;  Location: [facility] ENDOSCOPY;  Service: Gastroenterology;;   REMOVAL OF STONES  09/15/2021   Procedure: REMOVAL OF STONES;  Surgeon: Lemar Lofty., MD;  Location: [facility] ENDOSCOPY;  Service: Gastroenterology;;   Dennison Mascot  01/08/2018   Procedure: Dennison Mascot;  Surgeon: Lynann Bologna, MD;  Location: [facility] ENDOSCOPY;  Service: Endoscopy;;   SPHINCTEROTOMY  02/09/2018   Procedure: Dennison Mascot;  Surgeon: Mansouraty, Netty Starring., MD;  Location: [facility] ENDOSCOPY;  Service: Gastroenterology;;   [facility] CHOLANGIOSCOPY N/A 06/05/2021   Procedure: [facility] CHOLANGIOSCOPY;  Surgeon: Lemar Lofty., MD;  Location: [facility] ENDOSCOPY;  Service: Gastroenterology;  Laterality: N/A;   STENT REMOVAL  02/09/2018   Procedure: STENT REMOVAL;  Surgeon: Lemar Lofty., MD;  Location: [facility] ENDOSCOPY;  Service: Gastroenterology;;   Francine Graven REMOVAL  03/30/2018   Procedure: STENT REMOVAL;  Surgeon: Lemar Lofty., MD;  Location: [facility] ENDOSCOPY;  Service: Gastroenterology;;   Francine Graven REMOVAL  06/05/2021   Procedure: STENT REMOVAL;  Surgeon: Lemar Lofty., MD;  Location: [facility] ENDOSCOPY;  Service: Gastroenterology;;   TRANSTHORACIC ECHOCARDIOGRAM  04/2015   EF 40-45%, mod antsept, ant, antlat, apical HK, mild AI/MR, PASP .   TRANSTHORACIC ECHOCARDIOGRAM  08/23/2020   EF 55 to 60%.  No RWMA.  Mild LVH.  GR 1 DD.  Normal RV size and function with normal RVP.  Normal mitral and aortic valve.   UVULOPALATOPHARYNGOPLASTY, TONSILLECTOMY AND SEPTOPLASTY  2002   VIDEO BRONCHOSCOPY N/A 10/29/2022   Procedure: VIDEO BRONCHOSCOPY WITH FLUORO;  Surgeon: [name], MD;  Location: [facility] ENDOSCOPY;   Service: Pulmonary;  Laterality: N/A;    No Known Allergies  Immunization History  Administered Date(s) Administered   Fluad Quad(high Dose 65+) 01/20/2022   Influenza, High Dose Seasonal PF 03/04/2016, 02/21/2018, 02/23/2019, 03/11/2021   Influenza, Seasonal, Injecte, Preservative Fre 05/03/2012, 05/05/2014, 02/28/2015   Influenza,inj,Quad PF,6+ Mos 02/27/2013   Influenza-Unspecified 03/17/2017   PFIZER Comirnaty(Gray Top)Covid-19 Tri-Sucrose Vaccine 03/23/2020, 09/28/2020   PFIZER(Purple Top)SARS-COV-2 Vaccination 08/25/2019, 09/19/2019   Pneumococcal Conjugate-13 04/28/2015   Pneumococcal Polysaccharide-23 10/30/2016   Respiratory Syncytial Virus Vaccine,Recomb  Aduvanted(Arexvy) 01/20/2022   Td 05/03/2012   Zoster Recombinant(Shingrix) 01/20/2022, 05/01/2022   Zoster, Live 06/29/2013    Family History  Problem Relation Age of Onset   Diabetes Mother    Heart disease Mother    Cancer Sister 67       male cancer   Breast cancer Brother 50   Hypertension Maternal Aunt    Cancer Maternal Uncle        spinal   Coronary artery disease Neg Hx    Stroke Neg Hx    Colon cancer Neg Hx    Pancreatic cancer Neg Hx    Stomach cancer Neg Hx    Esophageal cancer Neg Hx    Inflammatory bowel disease Neg Hx    Liver disease Neg Hx    Rectal cancer Neg Hx      Current Outpatient Medications:    acetaminophen (TYLENOL) 500 MG tablet, Take 1,000 mg by mouth every 6 (six) hours as needed for moderate pain or headache. , Disp: , Rfl:    Ascorbic Acid (VITAMIN C) 1000 MG tablet, Take 1,000 mg by mouth daily., Disp: , Rfl:    Carboxymethylcellulose Sodium (LUBRICANT EYE DROPS OP), Place 2 drops into both eyes daily as needed (for dry eyes)., Disp: , Rfl:    Cholecalciferol (VITAMIN D3) 50 MCG (2000 UT) TABS, Take 2,000 Units by mouth daily., Disp: , Rfl:    gabapentin (NEURONTIN) 600 MG tablet, TAKE 2 TABLETS BY MOUTH 3 TIMES  DAILY, Disp: 540 tablet, Rfl: 3   ibuprofen (ADVIL) 200 MG  tablet, Take 400 mg by mouth every 6 (six) hours as needed for headache or moderate pain., Disp: , Rfl:    Magnesium 250 MG TABS, Take 1 tablet (250 mg total) by mouth at bedtime. (Patient taking differently: Take 250 mg by mouth at bedtime.), Disp: , Rfl: 0   mirabegron ER (MYRBETRIQ) 25 MG TB24 tablet, Take 1 tablet (25 mg total) by mouth daily., Disp: 30 tablet, Rfl: 11   Omega-3 Fatty Acids (FISH OIL) 1000 MG CAPS, Take 1,000 mg by mouth daily. , Disp: , Rfl:    omeprazole (PRILOSEC) 40 MG capsule, Take 1 capsule (40 mg total) by mouth 2 (two) times daily before a meal., Disp: 180 capsule, Rfl: 4   predniSONE (DELTASONE) 10 MG tablet, Take 3 tablets (30 mg total) by mouth daily with breakfast for 56 days, THEN 2 tablets (20 mg total) daily with breakfast for 14 days, THEN 1 tablet (10 mg total) daily with breakfast for 14 days. Take 3 tablets daily for 8 weeks, two tablets daily for 2 weeks then 1 tablet daily till next visit., Disp: 210 tablet, Rfl: 0   rosuvastatin (CRESTOR) 40 MG tablet, Take 1 tablet (40 mg total) by mouth daily., Disp: 90 tablet, Rfl: 4   sildenafil (VIAGRA) 100 MG tablet, TAKE 1/2 TO 1 TABLET BY MOUTH DAILY AS NEEDED FOR ERECTILE DYSFUNCTION (Patient taking differently: Take 50-100 mg by mouth daily as needed for erectile dysfunction.), Disp: 6 tablet, Rfl: 0   tamsulosin (FLOMAX) 0.4 MG CAPS capsule, TAKE 1 CAPSULE BY MOUTH EVERY DAY, Disp: 90 capsule, Rfl: 3   vitamin B-12 (CYANOCOBALAMIN) 1000 MCG tablet, Take 1,000 mcg by mouth daily., Disp: , Rfl:    vitamin E 400 UNIT capsule, Take 400 Units by mouth daily., Disp: , Rfl:       Objective:   Vitals:   01/28/23 1413 01/28/23 1503  BP: 122/64   Pulse: 90 80  Temp:  98.2 F (36.8 C)   TempSrc: Oral   SpO2: 96% 94%  Weight: 206 lb (93.4 kg)   Height: 5\' 10"  (1.778 m)     Estimated body mass index is 29.56 kg/m as calculated from the following:   Height as of this encounter: 5\' 10"  (1.778 m).   Weight as of  this encounter: 206 lb (93.4 kg).  @WEIGHTCHANGE @  [employer]   01/28/23 1413  Weight: 206 lb (93.4 kg)     Physical Exam   General: No distress. Obese. Has crutches O2 at rest: yes bu 94% on room air at rest Cane present: YES CRUTCHeS Sitting in wheel chair: no Frail: n Obese: yes Neuro: Alert and Oriented x 3. GCS 15. Speech normal Psych: Pleasant Resp:  Barrel Chest - no.  Wheeze - no, Crackles - yes, No overt respiratory distress CVS: Normal heart sounds. Murmurs - no Ext: Stigmata of Connective Tissue Disease - no HEENT: Normal upper airway. PEERL +. No post nasal drip        Assessment:       ICD-10-CM   1. ILD (interstitial lung disease) (HCC)  J84.9     2. Hypersensitivity pneumonitis (HCC)  J67.9     3. Mold suspected exposure  Z77.120     4. Chronic cough  R05.3     5. Dyspnea on exertion  R06.09     6. Exercise hypoxemia  R09.02          Plan:     Patient Instructions     ICD-10-CM   1. ILD (interstitial lung disease) (HCC)  J84.9     2. Hypersensitivity pneumonitis (HCC)  J67.9     3. Mold suspected exposure  Z77.120     4. Chronic cough  R05.3     5. Dyspnea on exertion  R06.09     6. Exercise hypoxemia  R09.02      The best case evidence is that you have a condition called chronic hypersensitive pneumonitis which is type of pulmonary fibrosis or interstitial lung disease.  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  I think the COVID you had in 2022 has made this more progressive by "stirring the pot".  Currently it is progressive and typically once it starts progressing it was only get worse with time  Glad to know that prednisone is helping a cough but as you reduce the prednisone the cough is coming back  Plan -Based on the above evidence and risk-benefit ratio hold off on surgical lung biopsy - Main goal is to  prevent progression and to help your quality of life -Stop fish oil -Increase your prednisone to 15 mg/day  -You can do 20 mg on Monday and Wednesday and Friday and 10 mg on Tuesday Thursday Saturday and Sunday -Start CellCept 500 mg twice daily and we will slowly escalate up  -Extensively counseled about this immunosuppressive drug that can potentially prevent progression and also potentially help with symptoms allowing [place] to taper your prednisone over time  -Check CBC, chemistry and liver function test and BNP today  -Check Blood QuantiFERON gold  -Check G6PD -     -Refer to our pharmacy team for CellCept counseling and started medication review  -Continue oxygen 2 L nasal cannula with exertion and at night -In the future consider clinical research including registry program -Avoid sick contacts  Follow-up - 6 weeks to see Dr. Marchelle Gearing  -ILD symptom score and possibly exercise hypoxemia test in the clinic at follow-up     FOLLOWUP Return in about 6 weeks (around 03/11/2023) for 30 min visit, with Dr Marchelle Gearing, Face to Face Visit.  ( Level 05 visit E&M 2024: Estb >= 40 min   in  visit type: on-site physical face to visit  in total care time and counseling or/and coordination of care by this undersigned MD - Dr Kalman Shan. This includes one or more of the following on this same day 01/28/2023: pre-charting, chart review, note writing, documentation discussion of test results, diagnostic or treatment recommendations, prognosis, risks and benefits of management options, instructions, education, compliance or risk-factor reduction. It excludes time spent by the CMA or office staff in the care of the patient. Actual time 45 min)   SIGNATURE    Dr. Kalman Shan, M.D., F.C.C.P,  Pulmonary and Critical Care Medicine Staff Physician, [facility] Director - Interstitial Lung Disease  Program  [facility] [place], [place], [address]  Pager: [phone], If no answer or between  15:00h - 7:00h: call [phone] Telephone: [phone]  3:06 PM 01/28/2023

## 2023-01-28 NOTE — Telephone Encounter (Signed)
Rx team   - SHONTA MELI -is quite symptomatic with cough from his chronic HP.  When prednisone is tapered his cough is coming back.  Therefore I decided to start him on CellCept.  We gotten started find milligrams twice daily.  I noticed that he is on omeprazole which can interfere with the absorption.  Plan - Please call him in 4 counseling and concur and also the G6PD is back and is normal you can start Bactrim prophylaxis -and initiate starting CellCept  Thanks

## 2023-01-28 NOTE — Telephone Encounter (Signed)
Patient scheduled w appt with pharmact team on 02/10/23  Chesley Mires, PharmD, MPH, BCPS, CPP Clinical Pharmacist (Rheumatology and Pulmonology)

## 2023-01-28 NOTE — Patient Instructions (Addendum)
ICD-10-CM   1. ILD (interstitial lung disease) (HCC)  J84.9     2. Hypersensitivity pneumonitis (HCC)  J67.9     3. Mold suspected exposure  Z77.120     4. Chronic cough  R05.3     5. Dyspnea on exertion  R06.09     6. Exercise hypoxemia  R09.02      The best case evidence is that you have a condition called chronic hypersensitive pneumonitis which is type of pulmonary fibrosis or interstitial lung disease.  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 Alternates are NSIP, UIP/IPF  I think the COVID you had in 2022 has made this more progressive by "stirring the pot".  Currently it is progressive and typically once it starts progressing it was only get worse with time  Glad to know that prednisone is helping a cough but as you reduce the prednisone the cough is coming back  Plan -Based on the above evidence and risk-benefit ratio hold off on surgical lung biopsy - Main goal is to prevent progression and to help your quality of life -Stop fish oil -Increase your prednisone to 15 mg/day  -You can do 20 mg on Monday and Wednesday and Friday and 10 mg on Tuesday Thursday Saturday and Sunday -Start CellCept 500 mg twice daily and we will slowly escalate up  -Extensively counseled about this immunosuppressive drug that can potentially prevent progression and also potentially help with symptoms allowing [place] to taper your prednisone over time  -Check CBC, chemistry and liver function test and BNP today  -Check Blood QuantiFERON gold  -Check G6PD -     -Refer to our pharmacy team for CellCept counseling and started medication review  -Continue oxygen 2 L nasal cannula with exertion and at night -In the future consider clinical research including registry program -Avoid sick contacts  Follow-up - 6 weeks to see Dr. Marchelle Gearing  -ILD symptom score and possibly exercise hypoxemia  test in the clinic at follow-up

## 2023-01-29 LAB — BASIC METABOLIC PANEL
BUN: 20 mg/dL (ref 6–23)
CO2: 28 meq/L (ref 19–32)
Calcium: 9.4 mg/dL (ref 8.4–10.5)
Chloride: 104 meq/L (ref 96–112)
Creatinine, Ser: 0.59 mg/dL (ref 0.40–1.50)
GFR: 96.66 mL/min (ref >60.00)
Glucose, Bld: 112 mg/dL — ABNORMAL HIGH (ref 70–99)
Potassium: 4.3 meq/L (ref 3.5–5.1)
Sodium: 141 meq/L (ref 135–145)

## 2023-01-29 LAB — CBC WITH DIFFERENTIAL/PLATELET
Basophils Absolute: 0.1 10*3/uL (ref 0.0–0.1)
Basophils Relative: 0.7 % (ref 0.0–3.0)
Eosinophils Absolute: 0 10*3/uL (ref 0.0–0.7)
Eosinophils Relative: 0.5 % (ref 0.0–5.0)
HCT: 45.4 % (ref 39.0–52.0)
Hemoglobin: 15 g/dL (ref 13.0–17.0)
Lymphocytes Relative: 18 % (ref 12.0–46.0)
Lymphs Abs: 1.7 10*3/uL (ref 0.7–4.0)
MCHC: 33.1 g/dL (ref 30.0–36.0)
MCV: 100.8 fl — ABNORMAL HIGH (ref 78.0–100.0)
Monocytes Absolute: 0.6 10*3/uL (ref 0.1–1.0)
Monocytes Relative: 6.8 % (ref 3.0–12.0)
Neutro Abs: 7 10*3/uL (ref 1.4–7.7)
Neutrophils Relative %: 74 % (ref 43.0–77.0)
Platelets: 233 10*3/uL (ref 150.0–400.0)
RBC: 4.51 Mil/uL (ref 4.22–5.81)
RDW: 15.2 % (ref 11.5–15.5)
WBC: 9.4 10*3/uL (ref 4.0–10.5)

## 2023-01-29 LAB — HEPATIC FUNCTION PANEL
ALT: 25 U/L (ref 0–53)
AST: 14 U/L (ref 0–37)
Albumin: 4.1 g/dL (ref 3.5–5.2)
Alkaline Phosphatase: 42 U/L (ref 39–117)
Bilirubin, Direct: 0.1 mg/dL (ref 0.0–0.3)
Total Bilirubin: 0.7 mg/dL (ref 0.2–1.2)
Total Protein: 7 g/dL (ref 6.0–8.3)

## 2023-01-29 LAB — BRAIN NATRIURETIC PEPTIDE: Pro B Natriuretic peptide (BNP): 32 pg/mL (ref 0.0–100.0)

## 2023-02-01 LAB — QUANTIFERON-TB GOLD PLUS
Mitogen-NIL: >10 [IU]/mL
NIL: 0.07 [IU]/mL
QuantiFERON-TB Gold Plus: NEGATIVE
TB1-NIL: 0.03 [IU]/mL
TB2-NIL: 0.11 [IU]/mL

## 2023-02-01 LAB — GLUCOSE 6 PHOSPHATE DEHYDROGENASE: G-6PDH: 14.1 U/g Hgb (ref 7.0–20.5)

## 2023-02-10 ENCOUNTER — Ambulatory Visit: Payer: Medicare Other | Admitting: Pharmacist

## 2023-02-10 MED ORDER — SULFAMETHOXAZOLE-TRIMETHOPRIM 800-160 MG PO TABS
1.0000 | ORAL_TABLET | ORAL | 1 refills | Status: DC
Start: 2023-02-10 — End: 2023-08-19

## 2023-02-10 MED ORDER — MYCOPHENOLATE MOFETIL 500 MG PO TABS
ORAL_TABLET | ORAL | 0 refills | Status: DC
Start: 2023-02-10 — End: 2023-05-07

## 2023-02-10 NOTE — Progress Notes (Unsigned)
Pharmacy Note  Subjective: Patient called today by [facility] Pulmonology pharmacy team for counseling on mycophenolate (CellCept) for  chronic hypersensitivity pneumonitis associated ILD . He is taking prednisone 20 mg on Monday and Wednesday and Friday and 10 mg all other days  Objective: CBC    Component Value Date/Time   WBC 9.4 01/28/2023 1529   RBC 4.51 01/28/2023 1529   HGB 15.0 01/28/2023 1529   HCT 45.4 01/28/2023 1529   PLT 233.0 01/28/2023 1529   MCV 100.8 (H) 01/28/2023 1529   MCH 33.0 10/29/2022 0706   MCHC 33.1 01/28/2023 1529   RDW 15.2 01/28/2023 1529   LYMPHSABS 1.7 01/28/2023 1529   MONOABS 0.6 01/28/2023 1529   EOSABS 0.0 01/28/2023 1529   BASOSABS 0.1 01/28/2023 1529    CMP     Component Value Date/Time   NA 141 01/28/2023 1529   NA 139 07/13/2018 1013   K 4.3 01/28/2023 1529   CL 104 01/28/2023 1529   CO2 28 01/28/2023 1529   GLUCOSE 112 (H) 01/28/2023 1529   BUN 20 01/28/2023 1529   BUN 14 07/13/2018 1013   CREATININE 0.59 01/28/2023 1529   CALCIUM 9.4 01/28/2023 1529   PROT 7.0 01/28/2023 1529   PROT 6.9 11/21/2018 0905   ALBUMIN 4.1 01/28/2023 1529   ALBUMIN 4.3 11/21/2018 0905   AST 14 01/28/2023 1529   ALT 25 01/28/2023 1529   ALKPHOS 42 01/28/2023 1529   BILITOT 0.7 01/28/2023 1529   BILITOT 0.4 11/21/2018 0905   GFRNONAA >60 04/03/2021 0037   GFRAA 116 07/13/2018 1013    Baseline Immunosuppressant Therapy Labs TB GOLD    Latest Ref Rng & Units 01/28/2023    3:29 PM  Quantiferon TB Gold  Quantiferon TB Gold Plus NEGATIVE NEGATIVE    Hepatitis Panel    Latest Ref Rng & Units 03/31/2021    4:12 PM  Hepatitis  Hep B Surface Ag NON REACTIVE NON REACTIVE   Hep B IgM NON REACTIVE NON REACTIVE   Hep C Ab NON REACTIVE NON REACTIVE   Hep A IgM NON REACTIVE NON REACTIVE    HIV Lab Results  Component Value Date   HIV Non Reactive 01/07/2018   HIV NONREACTIVE 10/29/2015   Immunoglobulins   SPEP    Latest Ref Rng & Units  01/28/2023    3:29 PM  Serum Protein Electrophoresis  Total Protein 6.0 - 8.3 g/dL 7.0    H8IO Lab Results  Component Value Date   G6PDH 14.1 01/28/2023   Chest-xray:  10/29/2022 - 1. Markedly low lung volumes with coarsened interstitial markings consistent with underlying chronic lung disease. 2. No evidence of pneumothorax or other complication following biopsy.  Contraception: wife is post-menopausal  Assessment/Plan:  Patient was counseled on the purpose, proper use, and adverse effects of mycophenolate including risk of infection, new or reactivation of viral infections, nausea, and headaches.  Discussed warning of increased risk of development of lymphoma and other malignancies, particularly of the skin.  Discussed risk of neutropenia and discussed importance of regular labs to monitor blood counts, liver function, and kidney function. Counseled patient on importance of taking PCP prophylaxis while on mycophenolate.  Counseled patient on purpose, proper use, and adverse effects of sulfamethoxazole/trimethoprim three times a week.  Encouraged and answered all questions.     He will space omeprazole to be at least 2 hours before Cellcept or for omeprazole to be 4 hours AFTER Cellcept dose (since he sometimes takes omeprazole at noon)  Week Cellcept (  mycophenolate) Prednisone Bactrim Labs  1 & 2 500 mg twice daily 20 mg on Monday and Wednesday and Friday and 10 mg on Tuesday Thursday Saturday and Sunday  -------- Labs at end of Week 2  3 & 4  & onwards 1000 mg twice daily Try 10mg  once daily again. IF recurrent symptoms stay at previous dose START 1 tab Monday, Wednesday, Friday Labs at end of Week 4 then every 3 months   Consider increasing to 1500mg  twice daily      Cellcept dose: 500mg  twice daily x 2 weeks. Repeat labs. If stable, increase to 1000mg  twice daily x 2 weeks Start Bactrim DS MWF at Week 2 to ensure tolerability of Cellcept before initiating  F/u with Dr. Marchelle Gearing in 8  weeks.   Chesley Mires, PharmD, MPH, BCPS, CPP Clinical Pharmacist (Rheumatology and Pulmonology)

## 2023-02-11 ENCOUNTER — Encounter: Payer: Self-pay | Admitting: Pharmacist

## 2023-02-11 NOTE — Patient Instructions (Signed)
START Cellcept 500mg  twice daily x 2 weeks. REPEAT labs (CBC and CMP).   If labs stable: - INCREASE to Cellcept 1000mg  twice daily - START Bactrim DS 1 tab Monday, Wednesday, Friday  Repeat labs again in 2 weeks. If stable, will monitor labs every 3 months  HOLD  CELLCEPT  if you have signs or symptoms of an infection. You can resume once you feel better or back to your baseline. HOLD CELLCEPT if you start antibiotics to treat an infection. HOLD CELLCEPT around the time of surgery/procedures. Your surgeon will be able to provide recommendations on when to hold BEFORE and when you are cleared to RESUME.

## 2023-02-23 ENCOUNTER — Encounter: Payer: Self-pay | Admitting: Internal Medicine

## 2023-03-01 ENCOUNTER — Telehealth: Payer: Self-pay | Admitting: Internal Medicine

## 2023-03-01 DIAGNOSIS — Z79899 Other long term (current) drug therapy: Secondary | ICD-10-CM

## 2023-03-01 DIAGNOSIS — J849 Interstitial pulmonary disease, unspecified: Secondary | ICD-10-CM

## 2023-03-01 NOTE — Telephone Encounter (Signed)
Patient was placed on medication and requires blood work to be completed. Patient and wife are unsure if the blood work has been ordered. Please advise which day patient should come in to get blood work completed.

## 2023-03-03 ENCOUNTER — Other Ambulatory Visit (INDEPENDENT_AMBULATORY_CARE_PROVIDER_SITE_OTHER): Payer: Medicare Other

## 2023-03-03 DIAGNOSIS — Z79899 Other long term (current) drug therapy: Secondary | ICD-10-CM | POA: Diagnosis not present

## 2023-03-03 DIAGNOSIS — J849 Interstitial pulmonary disease, unspecified: Secondary | ICD-10-CM | POA: Diagnosis not present

## 2023-03-03 LAB — COMPREHENSIVE METABOLIC PANEL
ALT: 16 U/L (ref 0–53)
AST: 15 U/L (ref 0–37)
Albumin: 4.3 g/dL (ref 3.5–5.2)
Alkaline Phosphatase: 48 U/L (ref 39–117)
BUN: 14 mg/dL (ref 6–23)
CO2: 28 meq/L (ref 19–32)
Calcium: 9.8 mg/dL (ref 8.4–10.5)
Chloride: 106 meq/L (ref 96–112)
Creatinine, Ser: 0.61 mg/dL (ref 0.40–1.50)
GFR: 95.63 mL/min (ref >60.00)
Glucose, Bld: 144 mg/dL — ABNORMAL HIGH (ref 70–99)
Potassium: 4.8 meq/L (ref 3.5–5.1)
Sodium: 142 meq/L (ref 135–145)
Total Bilirubin: 0.8 mg/dL (ref 0.2–1.2)
Total Protein: 7.7 g/dL (ref 6.0–8.3)

## 2023-03-03 LAB — CBC WITH DIFFERENTIAL/PLATELET
Basophils Absolute: 0 10*3/uL (ref 0.0–0.1)
Basophils Relative: 0.3 % (ref 0.0–3.0)
Eosinophils Absolute: 0 10*3/uL (ref 0.0–0.7)
Eosinophils Relative: 0.1 % (ref 0.0–5.0)
HCT: 44.9 % (ref 39.0–52.0)
Hemoglobin: 15 g/dL (ref 13.0–17.0)
Lymphocytes Relative: 18 % (ref 12.0–46.0)
Lymphs Abs: 1.5 10*3/uL (ref 0.7–4.0)
MCHC: 33.5 g/dL (ref 30.0–36.0)
MCV: 101.5 fL — ABNORMAL HIGH (ref 78.0–100.0)
Monocytes Absolute: 0.3 10*3/uL (ref 0.1–1.0)
Monocytes Relative: 3 % (ref 3.0–12.0)
Neutro Abs: 6.7 10*3/uL (ref 1.4–7.7)
Neutrophils Relative %: 78.6 % — ABNORMAL HIGH (ref 43.0–77.0)
Platelets: 286 10*3/uL (ref 150.0–400.0)
RBC: 4.42 Mil/uL (ref 4.22–5.81)
RDW: 14.9 % (ref 11.5–15.5)
WBC: 8.6 10*3/uL (ref 4.0–10.5)

## 2023-03-05 NOTE — Telephone Encounter (Signed)
Labs have been drawn Nothing further needed.

## 2023-03-18 ENCOUNTER — Telehealth: Payer: Self-pay | Admitting: Internal Medicine

## 2023-03-18 ENCOUNTER — Ambulatory Visit: Payer: Medicare Other | Admitting: Internal Medicine

## 2023-03-18 ENCOUNTER — Encounter: Payer: Self-pay | Admitting: Internal Medicine

## 2023-03-18 VITALS — BP 128/70 | HR 101 | Resp 20 | Ht 70.0 in | Wt 224.0 lb

## 2023-03-18 DIAGNOSIS — Z79899 Other long term (current) drug therapy: Secondary | ICD-10-CM | POA: Diagnosis not present

## 2023-03-18 DIAGNOSIS — J018 Other acute sinusitis: Secondary | ICD-10-CM | POA: Diagnosis not present

## 2023-03-18 DIAGNOSIS — Z2989 Encounter for other specified prophylactic measures: Secondary | ICD-10-CM | POA: Diagnosis not present

## 2023-03-18 DIAGNOSIS — Z7189 Other specified counseling: Secondary | ICD-10-CM | POA: Diagnosis not present

## 2023-03-18 DIAGNOSIS — J849 Interstitial pulmonary disease, unspecified: Secondary | ICD-10-CM

## 2023-03-18 DIAGNOSIS — J679 Hypersensitivity pneumonitis due to unspecified organic dust: Secondary | ICD-10-CM | POA: Diagnosis not present

## 2023-03-18 LAB — HEPATIC FUNCTION PANEL
ALT: 17 U/L (ref 0–53)
AST: 15 U/L (ref 0–37)
Albumin: 4.4 g/dL (ref 3.5–5.2)
Alkaline Phosphatase: 52 U/L (ref 39–117)
Bilirubin, Direct: 0.1 mg/dL (ref 0.0–0.3)
Total Bilirubin: 0.7 mg/dL (ref 0.2–1.2)
Total Protein: 7.7 g/dL (ref 6.0–8.3)

## 2023-03-18 LAB — CBC WITH DIFFERENTIAL/PLATELET
Basophils Absolute: 0 10*3/uL (ref 0.0–0.1)
Basophils Relative: 0.5 % (ref 0.0–3.0)
Eosinophils Absolute: 0 10*3/uL (ref 0.0–0.7)
Eosinophils Relative: 0.3 % (ref 0.0–5.0)
HCT: 45.6 % (ref 39.0–52.0)
Hemoglobin: 15 g/dL (ref 13.0–17.0)
Lymphocytes Relative: 15.2 % (ref 12.0–46.0)
Lymphs Abs: 1.4 10*3/uL (ref 0.7–4.0)
MCHC: 32.9 g/dL (ref 30.0–36.0)
MCV: 103.7 fL — ABNORMAL HIGH (ref 78.0–100.0)
Monocytes Absolute: 0.6 10*3/uL (ref 0.1–1.0)
Monocytes Relative: 6.3 % (ref 3.0–12.0)
Neutro Abs: 7.1 10*3/uL (ref 1.4–7.7)
Neutrophils Relative %: 77.7 % — ABNORMAL HIGH (ref 43.0–77.0)
Platelets: 236 10*3/uL (ref 150.0–400.0)
RBC: 4.4 Mil/uL (ref 4.22–5.81)
RDW: 14.7 % (ref 11.5–15.5)
WBC: 9.1 10*3/uL (ref 4.0–10.5)

## 2023-03-18 LAB — BASIC METABOLIC PANEL
BUN: 14 mg/dL (ref 6–23)
CO2: 27 meq/L (ref 19–32)
Calcium: 9.6 mg/dL (ref 8.4–10.5)
Chloride: 104 meq/L (ref 96–112)
Creatinine, Ser: 0.54 mg/dL (ref 0.40–1.50)
GFR: 99.19 mL/min (ref >60.00)
Glucose, Bld: 126 mg/dL — ABNORMAL HIGH (ref 70–99)
Potassium: 4.2 meq/L (ref 3.5–5.1)
Sodium: 140 meq/L (ref 135–145)

## 2023-03-18 NOTE — Progress Notes (Signed)
xxxxxxxxxxxxxxxxxxxxxxxxxxxxxxxxxxxxxxxxxxxxxxxxxxxxxxxxxxxxx Interval HPI: June 2024  Underwent bronch/BAL/tblb 5/30. BAL diff with 18% lymphs, 11% eos.  NEGATIVE ENVISIA. Non-diagnostic TTBX for Histo pat  Gabapentin, ibuprofen with case report of eosinophilic pna but not this pattern Rosuvastatin with ILD but not this pattern  Overall still with a lack of energy. No change in DOE, cough. Has not yet set up appointment to get  autoPAP, playing phone tag with [employer].   Otherwise pertinent review of systems is negative.  - Start prednisone 30 mg daily - see instructions for taper  - Spirometry next visit - see you in 2-3 months or sooner if need be!  OV 01/28/2023 -transfer of care from Dr. Felisa Bonier to Dr. Marchelle Gearing in the ILD center.  Subjective:  Patient ID: Alexander [name], male , DOB: 06-Apr-1951 , age 72 y.o. , MRN: [id number] , ADDRESS: [address] [place] [address] PCP Eustaquio Boyden, MD Patient Care Team: Eustaquio Boyden, MD as PCP - General (Family Medicine) Marykay Lex, MD as PCP - Cardiology (Cardiology) Marykay Lex, MD as Consulting Physician (Cardiology) Merwyn Katos, MD (Inactive) as Consulting Physician (Pulmonary Disease)  This Provider for this visit: Treatment Team:  Attending Provider: Kalman Shan, MD    01/28/2023 -   Chief Complaint  Patient presents with   Consult    Former pt of Dr. Thora Lance.  Discuss labs and cxr/scan     HPI Alexander [name] 72 y.o. -presents for follow-up.  History is provided by reviewing of the prior records and also talking to his wife and talking to him   #background: He grew up in [place].  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  After that he recovered he just walked with a limp.  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  He also then worked for [employer]  during the same time.[employer]land [employer]  He is to smoke occasionally during this time.  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  No visible mold.  He then retired and then moved to [place] because his wife is originally from [place] and his sister-in-law lives in this area.  In the last few years his polio has flared up and is now using crutches.  #ILD reviewed the CT scans of the chest suggest  that he had ILD even in 2016 and even in  and 2022.  In early 2023 he suffered from COVID-19.  Then by October 2023 started having a cough and then established with Dr. Felisa Bonier in early 2024.  In my personal visualization the ILD has progressed.  In conference they have raised the specter of hypersensitive pneumonitis because of air trapping and the features.  I personally agree with this.  His main symptoms was cough followed by shortness of breath.  During this time he had a bronchoscopy with lavage is mixed cellularity.  Nondiagnostic on RNA genomic classifier and histopathology transbronchial biopsies.  However this does not preclude chronic HP.  His serologies are essentially normal.  By mid June 2024 after the ILD conference started on prednisone 30 mg/day.  He slowly taper this and currently on 10 mg.  The cough resolved but according to his wife is coming back now at the lower dose of prednisone.  Uses oxygen for the last 1  xxxxxxxxxxxxxxxxxxxxxxxxxxxxxxxxxxxxxxxxxxxxxxxxxxxxxxxxxxxxx Interval HPI: June 2024  Underwent bronch/BAL/tblb 5/30. BAL diff with 18% lymphs, 11% eos.  NEGATIVE ENVISIA. Non-diagnostic TTBX for Histo pat  Gabapentin, ibuprofen with case report of eosinophilic pna but not this pattern Rosuvastatin with ILD but not this pattern  Overall still with a lack of energy. No change in DOE, cough. Has not yet set up appointment to get  autoPAP, playing phone tag with [employer].   Otherwise pertinent review of systems is negative.  - Start prednisone 30 mg daily - see instructions for taper  - Spirometry next visit - see you in 2-3 months or sooner if need be!  OV 01/28/2023 -transfer of care from Dr. Felisa Bonier to Dr. Marchelle Gearing in the ILD center.  Subjective:  Patient ID: Alexander [name], male , DOB: 06-Apr-1951 , age 72 y.o. , MRN: [id number] , ADDRESS: [address] [place] [address] PCP Eustaquio Boyden, MD Patient Care Team: Eustaquio Boyden, MD as PCP - General (Family Medicine) Marykay Lex, MD as PCP - Cardiology (Cardiology) Marykay Lex, MD as Consulting Physician (Cardiology) Merwyn Katos, MD (Inactive) as Consulting Physician (Pulmonary Disease)  This Provider for this visit: Treatment Team:  Attending Provider: Kalman Shan, MD    01/28/2023 -   Chief Complaint  Patient presents with   Consult    Former pt of Dr. Thora Lance.  Discuss labs and cxr/scan     HPI Alexander [name] 72 y.o. -presents for follow-up.  History is provided by reviewing of the prior records and also talking to his wife and talking to him   #background: He grew up in [place].  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  After that he recovered he just walked with a limp.  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  He also then worked for [employer]  during the same time.[employer]land [employer]  He is to smoke occasionally during this time.  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  No visible mold.  He then retired and then moved to [place] because his wife is originally from [place] and his sister-in-law lives in this area.  In the last few years his polio has flared up and is now using crutches.  #ILD reviewed the CT scans of the chest suggest  that he had ILD even in 2016 and even in  and 2022.  In early 2023 he suffered from COVID-19.  Then by October 2023 started having a cough and then established with Dr. Felisa Bonier in early 2024.  In my personal visualization the ILD has progressed.  In conference they have raised the specter of hypersensitive pneumonitis because of air trapping and the features.  I personally agree with this.  His main symptoms was cough followed by shortness of breath.  During this time he had a bronchoscopy with lavage is mixed cellularity.  Nondiagnostic on RNA genomic classifier and histopathology transbronchial biopsies.  However this does not preclude chronic HP.  His serologies are essentially normal.  By mid June 2024 after the ILD conference started on prednisone 30 mg/day.  He slowly taper this and currently on 10 mg.  The cough resolved but according to his wife is coming back now at the lower dose of prednisone.  Uses oxygen for the last 1  xxxxxxxxxxxxxxxxxxxxxxxxxxxxxxxxxxxxxxxxxxxxxxxxxxxxxxxxxxxxx Interval HPI: June 2024  Underwent bronch/BAL/tblb 5/30. BAL diff with 18% lymphs, 11% eos.  NEGATIVE ENVISIA. Non-diagnostic TTBX for Histo pat  Gabapentin, ibuprofen with case report of eosinophilic pna but not this pattern Rosuvastatin with ILD but not this pattern  Overall still with a lack of energy. No change in DOE, cough. Has not yet set up appointment to get  autoPAP, playing phone tag with [employer].   Otherwise pertinent review of systems is negative.  - Start prednisone 30 mg daily - see instructions for taper  - Spirometry next visit - see you in 2-3 months or sooner if need be!  OV 01/28/2023 -transfer of care from Dr. Felisa Bonier to Dr. Marchelle Gearing in the ILD center.  Subjective:  Patient ID: Alexander [name], male , DOB: 06-Apr-1951 , age 72 y.o. , MRN: [id number] , ADDRESS: [address] [place] [address] PCP Eustaquio Boyden, MD Patient Care Team: Eustaquio Boyden, MD as PCP - General (Family Medicine) Marykay Lex, MD as PCP - Cardiology (Cardiology) Marykay Lex, MD as Consulting Physician (Cardiology) Merwyn Katos, MD (Inactive) as Consulting Physician (Pulmonary Disease)  This Provider for this visit: Treatment Team:  Attending Provider: Kalman Shan, MD    01/28/2023 -   Chief Complaint  Patient presents with   Consult    Former pt of Dr. Thora Lance.  Discuss labs and cxr/scan     HPI Alexander [name] 72 y.o. -presents for follow-up.  History is provided by reviewing of the prior records and also talking to his wife and talking to him   #background: He grew up in [place].  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  After that he recovered he just walked with a limp.  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  He also then worked for [employer]  during the same time.[employer]  He is to smoke occasionally during this time.  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  No visible mold.  He then retired and then moved to [place] because his wife is originally from [place] and his sister-in-law lives in this area.  In the last few years his polio has flared up and is now using crutches.  #ILD reviewed the CT scans of the chest suggest  that he had ILD even in 2016 and even in  and 2022.  In early 2023 he suffered from COVID-19.  Then by October 2023 started having a cough and then established with Dr. Felisa Bonier in early 2024.  In my personal visualization the ILD has progressed.  In conference they have raised the specter of hypersensitive pneumonitis because of air trapping and the features.  I personally agree with this.  His main symptoms was cough followed by shortness of breath.  During this time he had a bronchoscopy with lavage is mixed cellularity.  Nondiagnostic on RNA genomic classifier and histopathology transbronchial biopsies.  However this does not preclude chronic HP.  His serologies are essentially normal.  By mid June 2024 after the ILD conference started on prednisone 30 mg/day.  He slowly taper this and currently on 10 mg.  The cough resolved but according to his wife is coming back now at the lower dose of prednisone.  Uses oxygen for the last 1  xxxxxxxxxxxxxxxxxxxxxxxxxxxxxxxxxxxxxxxxxxxxxxxxxxxxxxxxxxxxx Interval HPI: June 2024  Underwent bronch/BAL/tblb 5/30. BAL diff with 18% lymphs, 11% eos.  NEGATIVE ENVISIA. Non-diagnostic TTBX for Histo pat  Gabapentin, ibuprofen with case report of eosinophilic pna but not this pattern Rosuvastatin with ILD but not this pattern  Overall still with a lack of energy. No change in DOE, cough. Has not yet set up appointment to get  autoPAP, playing phone tag with [employer].   Otherwise pertinent review of systems is negative.  - Start prednisone 30 mg daily - see instructions for taper  - Spirometry next visit - see you in 2-3 months or sooner if need be!  OV 01/28/2023 -transfer of care from Dr. Felisa Bonier to Dr. Marchelle Gearing in the ILD center.  Subjective:  Patient ID: Alexander [name], male , DOB: 06-Apr-1951 , age 72 y.o. , MRN: [id number] , ADDRESS: [address] [place] [address] PCP Eustaquio Boyden, MD Patient Care Team: Eustaquio Boyden, MD as PCP - General (Family Medicine) Marykay Lex, MD as PCP - Cardiology (Cardiology) Marykay Lex, MD as Consulting Physician (Cardiology) Merwyn Katos, MD (Inactive) as Consulting Physician (Pulmonary Disease)  This Provider for this visit: Treatment Team:  Attending Provider: Kalman Shan, MD    01/28/2023 -   Chief Complaint  Patient presents with   Consult    Former pt of Dr. Thora Lance.  Discuss labs and cxr/scan     HPI Alexander [name] 72 y.o. -presents for follow-up.  History is provided by reviewing of the prior records and also talking to his wife and talking to him   #background: He grew up in [place].  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  After that he recovered he just walked with a limp.  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  He also then worked for [employer]  during the same time.[employer]  He is to smoke occasionally during this time.  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  No visible mold.  He then retired and then moved to [place] because his wife is originally from [place] and his sister-in-law lives in this area.  In the last few years his polio has flared up and is now using crutches.  #ILD reviewed the CT scans of the chest suggest  that he had ILD even in 2016 and even in  and 2022.  In early 2023 he suffered from COVID-19.  Then by October 2023 started having a cough and then established with Dr. Felisa Bonier in early 2024.  In my personal visualization the ILD has progressed.  In conference they have raised the specter of hypersensitive pneumonitis because of air trapping and the features.  I personally agree with this.  His main symptoms was cough followed by shortness of breath.  During this time he had a bronchoscopy with lavage is mixed cellularity.  Nondiagnostic on RNA genomic classifier and histopathology transbronchial biopsies.  However this does not preclude chronic HP.  His serologies are essentially normal.  By mid June 2024 after the ILD conference started on prednisone 30 mg/day.  He slowly taper this and currently on 10 mg.  The cough resolved but according to his wife is coming back now at the lower dose of prednisone.  Uses oxygen for the last 1  month.  Today room air at rest was 94%.       OV 03/18/2023  Subjective:  Patient ID: Caprice [name], male , DOB: [birth year] , age 72 y.o. , MRN: [id number] , ADDRESS: [address] [place] [address] PCP Eustaquio Boyden, MD Patient Care Team: Eustaquio Boyden, MD as PCP - General (Family Medicine) Marykay Lex, MD as PCP - Cardiology (Cardiology) Marykay Lex, MD as Consulting Physician (Cardiology) Merwyn Katos, MD (Inactive) as Consulting Physician (Pulmonary Disease)  This Provider for this visit: Treatment Team:  Attending Provider: Kalman Shan, MD    03/18/2023 -   Chief Complaint  Patient presents with   Follow-up    ILD- Doing ok SOB is getting worse and his cough is worse now that he is done with prednisone. UTD on flu, covid, pneumo and rsv vaccines. Wants to go over labs. Lots of mucous.  Before sit stand O2 on room air 94 pulse of 101. After sit stand O2 was 82 and pulse was 129.   Interstitial lung  disease chronic hypersensitive pneumonitis  -Etiology mold exposure while working in [employer] many years ago  -Diagnosis given end of August 2024  -Progressive phenotype with COVID in 2022 stirring the pot   -   on prednisone since mid June 2024   -Started CellCept and prophylactic Bactrim mid September 2024  Sleep apnea: CPAP compliance report July 2024 through mid October 2024: 77% compliance for greater than 4 hours and 19% compliance for less than 4 hours a total of 96% compliance.  HPI PANKAJ HAACK 72 y.o. -returns for follow-up.    #Interstitial lung disease and chronic hypersensitive pneumonitis associate with high risk prescription and encounter for therapeutic monitoring  -Oxygen requirement is the same.  Symptom scores around stable but he does tell me that and his wife also affirms that since going up on the CellCept 1000 mg twice daily he is a little more fatigued.  This also corresponds with him going down on the prednisone to 10 mg/day.  Took a shared decision making for him to hold at this dose without having to either taper the prednisone further or escalate the CellCept to 1500 mg twice daily.  He is okay with this.  Today we will get safety labs.  He continues on his 2 L oxygen with exercise and at night.  Unclear if he stopped his fish oil  #New complaint: He is a lot of grandkids around him and he feels because of this exposure he is having chronic sinus complaint.  For the last few days it is also worse with greenish drainage.  He is already on Bactrim for PCP prophylaxis.    SYMPTOM SCALE - ILD 01/28/2023 03/18/2023   Current weight Ra 94%   O2 use 2L   Shortness of Breath 0 -> 5 scale with 5 being worst (score 6 If unable to do)   At rest 3 1  Simple tasks - showers, clothes change, eating, shaving 3 3  Household (dishes, doing bed, laundry) 3 3  Shopping 4 1  Walking level at own pace 5 5  Walking up Stairs 5 5  Total (30-36) Dyspnea Score 23 18  How bad is  your cough? 3 4  How bad is your fatigue 3 4  How bad is nausea 0 0  How bad is vomiting?  0 0  How bad is diarrhea? 0 1  How bad is anxiety? 0 0  How  month.  Today room air at rest was 94%.       OV 03/18/2023  Subjective:  Patient ID: Caprice [name], male , DOB: [birth year] , age 72 y.o. , MRN: [id number] , ADDRESS: [address] [place] [address] PCP Eustaquio Boyden, MD Patient Care Team: Eustaquio Boyden, MD as PCP - General (Family Medicine) Marykay Lex, MD as PCP - Cardiology (Cardiology) Marykay Lex, MD as Consulting Physician (Cardiology) Merwyn Katos, MD (Inactive) as Consulting Physician (Pulmonary Disease)  This Provider for this visit: Treatment Team:  Attending Provider: Kalman Shan, MD    03/18/2023 -   Chief Complaint  Patient presents with   Follow-up    ILD- Doing ok SOB is getting worse and his cough is worse now that he is done with prednisone. UTD on flu, covid, pneumo and rsv vaccines. Wants to go over labs. Lots of mucous.  Before sit stand O2 on room air 94 pulse of 101. After sit stand O2 was 82 and pulse was 129.   Interstitial lung  disease chronic hypersensitive pneumonitis  -Etiology mold exposure while working in [employer] many years ago  -Diagnosis given end of August 2024  -Progressive phenotype with COVID in 2022 stirring the pot   -   on prednisone since mid June 2024   -Started CellCept and prophylactic Bactrim mid September 2024  Sleep apnea: CPAP compliance report July 2024 through mid October 2024: 77% compliance for greater than 4 hours and 19% compliance for less than 4 hours a total of 96% compliance.  HPI PANKAJ HAACK 72 y.o. -returns for follow-up.    #Interstitial lung disease and chronic hypersensitive pneumonitis associate with high risk prescription and encounter for therapeutic monitoring  -Oxygen requirement is the same.  Symptom scores around stable but he does tell me that and his wife also affirms that since going up on the CellCept 1000 mg twice daily he is a little more fatigued.  This also corresponds with him going down on the prednisone to 10 mg/day.  Took a shared decision making for him to hold at this dose without having to either taper the prednisone further or escalate the CellCept to 1500 mg twice daily.  He is okay with this.  Today we will get safety labs.  He continues on his 2 L oxygen with exercise and at night.  Unclear if he stopped his fish oil  #New complaint: He is a lot of grandkids around him and he feels because of this exposure he is having chronic sinus complaint.  For the last few days it is also worse with greenish drainage.  He is already on Bactrim for PCP prophylaxis.    SYMPTOM SCALE - ILD 01/28/2023 03/18/2023   Current weight Ra 94%   O2 use 2L   Shortness of Breath 0 -> 5 scale with 5 being worst (score 6 If unable to do)   At rest 3 1  Simple tasks - showers, clothes change, eating, shaving 3 3  Household (dishes, doing bed, laundry) 3 3  Shopping 4 1  Walking level at own pace 5 5  Walking up Stairs 5 5  Total (30-36) Dyspnea Score 23 18  How bad is  your cough? 3 4  How bad is your fatigue 3 4  How bad is nausea 0 0  How bad is vomiting?  0 0  How bad is diarrhea? 0 1  How bad is anxiety? 0 0  How  month.  Today room air at rest was 94%.       OV 03/18/2023  Subjective:  Patient ID: Caprice [name], male , DOB: [birth year] , age 72 y.o. , MRN: [id number] , ADDRESS: [address] [place] [address] PCP Eustaquio Boyden, MD Patient Care Team: Eustaquio Boyden, MD as PCP - General (Family Medicine) Marykay Lex, MD as PCP - Cardiology (Cardiology) Marykay Lex, MD as Consulting Physician (Cardiology) Merwyn Katos, MD (Inactive) as Consulting Physician (Pulmonary Disease)  This Provider for this visit: Treatment Team:  Attending Provider: Kalman Shan, MD    03/18/2023 -   Chief Complaint  Patient presents with   Follow-up    ILD- Doing ok SOB is getting worse and his cough is worse now that he is done with prednisone. UTD on flu, covid, pneumo and rsv vaccines. Wants to go over labs. Lots of mucous.  Before sit stand O2 on room air 94 pulse of 101. After sit stand O2 was 82 and pulse was 129.   Interstitial lung  disease chronic hypersensitive pneumonitis  -Etiology mold exposure while working in [employer] many years ago  -Diagnosis given end of August 2024  -Progressive phenotype with COVID in 2022 stirring the pot   -   on prednisone since mid June 2024   -Started CellCept and prophylactic Bactrim mid September 2024  Sleep apnea: CPAP compliance report July 2024 through mid October 2024: 77% compliance for greater than 4 hours and 19% compliance for less than 4 hours a total of 96% compliance.  HPI PANKAJ HAACK 72 y.o. -returns for follow-up.    #Interstitial lung disease and chronic hypersensitive pneumonitis associate with high risk prescription and encounter for therapeutic monitoring  -Oxygen requirement is the same.  Symptom scores around stable but he does tell me that and his wife also affirms that since going up on the CellCept 1000 mg twice daily he is a little more fatigued.  This also corresponds with him going down on the prednisone to 10 mg/day.  Took a shared decision making for him to hold at this dose without having to either taper the prednisone further or escalate the CellCept to 1500 mg twice daily.  He is okay with this.  Today we will get safety labs.  He continues on his 2 L oxygen with exercise and at night.  Unclear if he stopped his fish oil  #New complaint: He is a lot of grandkids around him and he feels because of this exposure he is having chronic sinus complaint.  For the last few days it is also worse with greenish drainage.  He is already on Bactrim for PCP prophylaxis.    SYMPTOM SCALE - ILD 01/28/2023 03/18/2023   Current weight Ra 94%   O2 use 2L   Shortness of Breath 0 -> 5 scale with 5 being worst (score 6 If unable to do)   At rest 3 1  Simple tasks - showers, clothes change, eating, shaving 3 3  Household (dishes, doing bed, laundry) 3 3  Shopping 4 1  Walking level at own pace 5 5  Walking up Stairs 5 5  Total (30-36) Dyspnea Score 23 18  How bad is  your cough? 3 4  How bad is your fatigue 3 4  How bad is nausea 0 0  How bad is vomiting?  0 0  How bad is diarrhea? 0 1  How bad is anxiety? 0 0  How  month.  Today room air at rest was 94%.       OV 03/18/2023  Subjective:  Patient ID: Caprice [name], male , DOB: [birth year] , age 72 y.o. , MRN: [id number] , ADDRESS: [address] [place] [address] PCP Eustaquio Boyden, MD Patient Care Team: Eustaquio Boyden, MD as PCP - General (Family Medicine) Marykay Lex, MD as PCP - Cardiology (Cardiology) Marykay Lex, MD as Consulting Physician (Cardiology) Merwyn Katos, MD (Inactive) as Consulting Physician (Pulmonary Disease)  This Provider for this visit: Treatment Team:  Attending Provider: Kalman Shan, MD    03/18/2023 -   Chief Complaint  Patient presents with   Follow-up    ILD- Doing ok SOB is getting worse and his cough is worse now that he is done with prednisone. UTD on flu, covid, pneumo and rsv vaccines. Wants to go over labs. Lots of mucous.  Before sit stand O2 on room air 94 pulse of 101. After sit stand O2 was 82 and pulse was 129.   Interstitial lung  disease chronic hypersensitive pneumonitis  -Etiology mold exposure while working in [employer] many years ago  -Diagnosis given end of August 2024  -Progressive phenotype with COVID in 2022 stirring the pot   -   on prednisone since mid June 2024   -Started CellCept and prophylactic Bactrim mid September 2024  Sleep apnea: CPAP compliance report July 2024 through mid October 2024: 77% compliance for greater than 4 hours and 19% compliance for less than 4 hours a total of 96% compliance.  HPI PANKAJ HAACK 72 y.o. -returns for follow-up.    #Interstitial lung disease and chronic hypersensitive pneumonitis associate with high risk prescription and encounter for therapeutic monitoring  -Oxygen requirement is the same.  Symptom scores around stable but he does tell me that and his wife also affirms that since going up on the CellCept 1000 mg twice daily he is a little more fatigued.  This also corresponds with him going down on the prednisone to 10 mg/day.  Took a shared decision making for him to hold at this dose without having to either taper the prednisone further or escalate the CellCept to 1500 mg twice daily.  He is okay with this.  Today we will get safety labs.  He continues on his 2 L oxygen with exercise and at night.  Unclear if he stopped his fish oil  #New complaint: He is a lot of grandkids around him and he feels because of this exposure he is having chronic sinus complaint.  For the last few days it is also worse with greenish drainage.  He is already on Bactrim for PCP prophylaxis.    SYMPTOM SCALE - ILD 01/28/2023 03/18/2023   Current weight Ra 94%   O2 use 2L   Shortness of Breath 0 -> 5 scale with 5 being worst (score 6 If unable to do)   At rest 3 1  Simple tasks - showers, clothes change, eating, shaving 3 3  Household (dishes, doing bed, laundry) 3 3  Shopping 4 1  Walking level at own pace 5 5  Walking up Stairs 5 5  Total (30-36) Dyspnea Score 23 18  How bad is  your cough? 3 4  How bad is your fatigue 3 4  How bad is nausea 0 0  How bad is vomiting?  0 0  How bad is diarrhea? 0 1  How bad is anxiety? 0 0  How  month.  Today room air at rest was 94%.       OV 03/18/2023  Subjective:  Patient ID: Caprice [name], male , DOB: [birth year] , age 72 y.o. , MRN: [id number] , ADDRESS: [id number] [place] [address] PCP Eustaquio Boyden, MD Patient Care Team: Eustaquio Boyden, MD as PCP - General (Family Medicine) Marykay Lex, MD as PCP - Cardiology (Cardiology) Marykay Lex, MD as Consulting Physician (Cardiology) Merwyn Katos, MD (Inactive) as Consulting Physician (Pulmonary Disease)  This Provider for this visit: Treatment Team:  Attending Provider: Kalman Shan, MD    03/18/2023 -   Chief Complaint  Patient presents with   Follow-up    ILD- Doing ok SOB is getting worse and his cough is worse now that he is done with prednisone. UTD on flu, covid, pneumo and rsv vaccines. Wants to go over labs. Lots of mucous.  Before sit stand O2 on room air 94 pulse of 101. After sit stand O2 was 82 and pulse was 129.   Interstitial lung  disease chronic hypersensitive pneumonitis  -Etiology mold exposure while working in [employer] many years ago  -Diagnosis given end of August 2024  -Progressive phenotype with COVID in 2022 stirring the pot   -   on prednisone since mid June 2024   -Started CellCept and prophylactic Bactrim mid September 2024  Sleep apnea: CPAP compliance report July 2024 through mid October 2024: 77% compliance for greater than 4 hours and 19% compliance for less than 4 hours a total of 96% compliance.  HPI PANKAJ HAACK 72 y.o. -returns for follow-up.    #Interstitial lung disease and chronic hypersensitive pneumonitis associate with high risk prescription and encounter for therapeutic monitoring  -Oxygen requirement is the same.  Symptom scores around stable but he does tell me that and his wife also affirms that since going up on the CellCept 1000 mg twice daily he is a little more fatigued.  This also corresponds with him going down on the prednisone to 10 mg/day.  Took a shared decision making for him to hold at this dose without having to either taper the prednisone further or escalate the CellCept to 1500 mg twice daily.  He is okay with this.  Today we will get safety labs.  He continues on his 2 L oxygen with exercise and at night.  Unclear if he stopped his fish oil  #New complaint: He is a lot of grandkids around him and he feels because of this exposure he is having chronic sinus complaint.  For the last few days it is also worse with greenish drainage.  He is already on Bactrim for PCP prophylaxis.    SYMPTOM SCALE - ILD 01/28/2023 03/18/2023   Current weight Ra 94%   O2 use 2L   Shortness of Breath 0 -> 5 scale with 5 being worst (score 6 If unable to do)   At rest 3 1  Simple tasks - showers, clothes change, eating, shaving 3 3  Household (dishes, doing bed, laundry) 3 3  Shopping 4 1  Walking level at own pace 5 5  Walking up Stairs 5 5  Total (30-36) Dyspnea Score 23 18  How bad is  your cough? 3 4  How bad is your fatigue 3 4  How bad is nausea 0 0  How bad is vomiting?  0 0  How bad is diarrhea? 0 1  How bad is anxiety? 0 0  How  month.  Today room air at rest was 94%.       OV 03/18/2023  Subjective:  Patient ID: Caprice [name], male , DOB: [birth year] , age 72 y.o. , MRN: [id number] , ADDRESS: [id number] [place] [address] PCP Eustaquio Boyden, MD Patient Care Team: Eustaquio Boyden, MD as PCP - General (Family Medicine) Marykay Lex, MD as PCP - Cardiology (Cardiology) Marykay Lex, MD as Consulting Physician (Cardiology) Merwyn Katos, MD (Inactive) as Consulting Physician (Pulmonary Disease)  This Provider for this visit: Treatment Team:  Attending Provider: Kalman Shan, MD    03/18/2023 -   Chief Complaint  Patient presents with   Follow-up    ILD- Doing ok SOB is getting worse and his cough is worse now that he is done with prednisone. UTD on flu, covid, pneumo and rsv vaccines. Wants to go over labs. Lots of mucous.  Before sit stand O2 on room air 94 pulse of 101. After sit stand O2 was 82 and pulse was 129.   Interstitial lung  disease chronic hypersensitive pneumonitis  -Etiology mold exposure while working in [employer] many years ago  -Diagnosis given end of August 2024  -Progressive phenotype with COVID in 2022 stirring the pot   -   on prednisone since mid June 2024   -Started CellCept and prophylactic Bactrim mid September 2024  Sleep apnea: CPAP compliance report July 2024 through mid October 2024: 77% compliance for greater than 4 hours and 19% compliance for less than 4 hours a total of 96% compliance.  HPI PANKAJ HAACK 72 y.o. -returns for follow-up.    #Interstitial lung disease and chronic hypersensitive pneumonitis associate with high risk prescription and encounter for therapeutic monitoring  -Oxygen requirement is the same.  Symptom scores around stable but he does tell me that and his wife also affirms that since going up on the CellCept 1000 mg twice daily he is a little more fatigued.  This also corresponds with him going down on the prednisone to 10 mg/day.  Took a shared decision making for him to hold at this dose without having to either taper the prednisone further or escalate the CellCept to 1500 mg twice daily.  He is okay with this.  Today we will get safety labs.  He continues on his 2 L oxygen with exercise and at night.  Unclear if he stopped his fish oil  #New complaint: He is a lot of grandkids around him and he feels because of this exposure he is having chronic sinus complaint.  For the last few days it is also worse with greenish drainage.  He is already on Bactrim for PCP prophylaxis.    SYMPTOM SCALE - ILD 01/28/2023 03/18/2023   Current weight Ra 94%   O2 use 2L   Shortness of Breath 0 -> 5 scale with 5 being worst (score 6 If unable to do)   At rest 3 1  Simple tasks - showers, clothes change, eating, shaving 3 3  Household (dishes, doing bed, laundry) 3 3  Shopping 4 1  Walking level at own pace 5 5  Walking up Stairs 5 5  Total (30-36) Dyspnea Score 23 18  How bad is  your cough? 3 4  How bad is your fatigue 3 4  How bad is nausea 0 0  How bad is vomiting?  0 0  How bad is diarrhea? 0 1  How bad is anxiety? 0 0  How

## 2023-03-18 NOTE — Patient Instructions (Addendum)
ICD-10-CM   1. ILD (interstitial lung disease) (HCC)  J84.9     2. Hypersensitivity pneumonitis (HCC)  J67.9     3. Long-term use of high-risk medication  Z79.899     4. High risk medication use  Z79.899     5. Need for pneumocystis prophylaxis  Z29.89     6. Encounter for medication counseling  Z71.89     7. Other acute sinusitis, recurrence not specified  J01.80      #Interstitial lung disease: Appears to be stable  #Therapeutic drug monitoring.  Having fatigue and worsening shortness of breath/cough with the prednisone taper but also with CellCept introduction mid September 2024  #Other issue today is acute sinusitis  Plan --Stop fish oil - continue prednisone at 10 mg/day [do not taper below for the moment] -Continue CellCept 1500 mg twice daily -Do not escalate further -Check CBC with diff, chemistry and liver function test 03/18/2023  -For acute on chronic sinusitis  -Check blood RAST allergy panel today 03/18/2023  - get CT sinus without contrast next few weeks  - Take doxycycline 100mg  po twice daily x 5 days; take after meals and avoid sunlight   -While taking doxycycline hold Bactrim   -Continue oxygen 2 L nasal cannula with exertion and at night -In the future consider clinical research including registry program -Avoid sick contacts  -4 weeks to spirometry and DLCO  Follow-up  - 4 weeks to see Dr. Marchelle Gearing x 30 min visit but after spirometry and DLCO  -ILD symptom score  at follow-up

## 2023-03-19 ENCOUNTER — Telehealth: Payer: Self-pay | Admitting: Internal Medicine

## 2023-03-19 MED ORDER — DOXYCYCLINE HYCLATE 100 MG PO TABS: 100.0000 mg | ORAL_TABLET | Freq: Two times a day (BID) | ORAL | 0 refills | Status: DC

## 2023-03-19 NOTE — Telephone Encounter (Signed)
Rx was not sent for doxy as outlined on recent AVS  I have sent this in  Pt aware  Nothing further needed

## 2023-03-19 NOTE — Telephone Encounter (Signed)
Robyn wife checking on medication for antibx. Robyn phone number is [phone].

## 2023-03-19 NOTE — Telephone Encounter (Signed)
Pt wife calling in to get her husband medication doxycycline 100mg   [id number] [place], [place] - [address]

## 2023-03-19 NOTE — Telephone Encounter (Signed)
Alexander [name] wife checking on medication for antibx. Alexander [name] phone number is [phone].

## 2023-03-21 LAB — ALLERGEN PROFILE, PERENNIAL ALLERGEN IGE

## 2023-03-24 ENCOUNTER — Ambulatory Visit
Admission: RE | Admit: 2023-03-24 | Discharge: 2023-03-24 | Disposition: A | Discharge status: Home / Self Care | Payer: Medicare Other | Source: Ambulatory Visit | Attending: Internal Medicine | Admitting: Internal Medicine

## 2023-03-24 DIAGNOSIS — J329 Chronic sinusitis, unspecified: Secondary | ICD-10-CM | POA: Diagnosis not present

## 2023-03-24 DIAGNOSIS — J018 Other acute sinusitis: Secondary | ICD-10-CM

## 2023-03-25 ENCOUNTER — Other Ambulatory Visit: Payer: Self-pay | Admitting: Internal Medicine

## 2023-04-01 ENCOUNTER — Encounter: Payer: Self-pay | Admitting: Cardiology

## 2023-04-01 ENCOUNTER — Other Ambulatory Visit: Payer: Self-pay | Admitting: *Deleted

## 2023-04-01 ENCOUNTER — Ambulatory Visit: Payer: Medicare Other | Attending: Cardiology | Admitting: Cardiology

## 2023-04-01 VITALS — BP 122/76 | HR 64 | Ht 70.0 in | Wt 227.4 lb

## 2023-04-01 DIAGNOSIS — I2510 Atherosclerotic heart disease of native coronary artery without angina pectoris: Secondary | ICD-10-CM | POA: Diagnosis not present

## 2023-04-01 DIAGNOSIS — I10 Essential (primary) hypertension: Secondary | ICD-10-CM

## 2023-04-01 DIAGNOSIS — J849 Interstitial pulmonary disease, unspecified: Secondary | ICD-10-CM | POA: Diagnosis not present

## 2023-04-01 DIAGNOSIS — R0609 Other forms of dyspnea: Secondary | ICD-10-CM

## 2023-04-01 DIAGNOSIS — Z9861 Coronary angioplasty status: Secondary | ICD-10-CM | POA: Diagnosis not present

## 2023-04-01 DIAGNOSIS — I480 Paroxysmal atrial fibrillation: Secondary | ICD-10-CM | POA: Diagnosis not present

## 2023-04-01 DIAGNOSIS — R42 Dizziness and giddiness: Secondary | ICD-10-CM | POA: Diagnosis not present

## 2023-04-01 DIAGNOSIS — I252 Old myocardial infarction: Secondary | ICD-10-CM

## 2023-04-01 DIAGNOSIS — E785 Hyperlipidemia, unspecified: Secondary | ICD-10-CM | POA: Diagnosis not present

## 2023-04-01 MED ORDER — ROSUVASTATIN CALCIUM 20 MG PO TABS: 20.0000 mg | ORAL_TABLET | Freq: Every day | ORAL | 3 refills | Status: DC

## 2023-04-01 NOTE — Assessment & Plan Note (Signed)
Based on symptoms, I think this is more consistent with symptoms of his IPF.  No actual CHF sounding symptoms.  However we will recheck a 2D echo just to assess EF and filling pressures as well as PAP.

## 2023-04-01 NOTE — Assessment & Plan Note (Signed)
Pulmonary Fibrosis Patient is on oxygen therapy but not consistently using it at home. No chest pain reported. Noted to have a persistent dry cough. Currently on CellCept for lung disease and weaning off prednisone. -Encourage consistent use of oxygen therapy at home. -Plan is to Wean off prednisone & Continue CellCept as prescribed. -PCCM plan to follow pulmonary function test to assess current lung function. Possible Pulmonary Hypertension Noted distended veins, suggesting possible back pressure from the lungs. No swelling or heart failure symptoms reported. -Order echocardiogram to assess right heart function and possible impact of pulmonary fibrosis.

## 2023-04-01 NOTE — Patient Instructions (Signed)
Medication Instructions:  Your physician has recommended you make the following change in your medication:  DECREASE: rosuvastatin (CRESTOR) 20 MG tablet - Take 1 tablet (20 mg total) by mouth daily  *If you need a refill on your cardiac medications before your next appointment, please call your pharmacy*  Lab Work: Your provider would like for you to get labs drawn when [name] gets labs at PCP to have the following labs drawn: fasting lipid panel.   You DO need to be fasting.  Testing/Procedures: Your physician has requested that you have an echocardiogram. Echocardiography is a painless test that uses sound waves to create images of your heart. It provides your doctor with information about the size and shape of your heart and how well your heart's chambers and valves are working. This procedure takes approximately one hour. There are no restrictions for this procedure. Please do NOT wear cologne, perfume, aftershave, or lotions (deodorant is allowed). Please arrive 15 minutes prior to your appointment time.   Follow-Up: At [facility], you and your health needs are our priority.  As part of our continuing mission to provide you with exceptional heart care, we have created designated Provider Care Teams.  These Care Teams include your primary Cardiologist (physician) and Advanced Practice Providers (APPs -  Physician Assistants and Nurse Practitioners) who all work together to provide you with the care you need, when you need it.  Your next appointment:   8 - 9  month(s)  Provider:   Bryan Lemma, MD    Other Instructions -None

## 2023-04-01 NOTE — Assessment & Plan Note (Signed)
LDL is well controlled at 43. Patient is currently on Crestor. -Reduce Crestor dose to half a tablet daily for a trial period of two weeks. -If no significant change in energy levels, return to full dose. -Check cholesterol levels in three months to ensure LDL remains below 70.

## 2023-04-01 NOTE — Assessment & Plan Note (Signed)
No more episodes since stopping ACE inhibitor and beta-blocker.  Encourage adequate hydration.

## 2023-04-01 NOTE — Progress Notes (Signed)
Cardiology Office Note:  .   Date:  04/01/2023  ID:  Alexander [name], DOB 20-Dec-1950, MRN [id number] PCP: Eustaquio Boyden, MD  [facility] Providers Cardiologist:  Bryan Lemma, MD     Chief Complaint  Patient presents with   Follow-up    12 month follow up visit. Patient states that he is experiencing shortness of breath. Patient has pulmonary fibrosis. Meds reviewed.     Patient Profile: .     Alexander [name] is a 72 y.o. male with a PMH noted below who presents here for delayed annual follow-up at the request of Eustaquio Boyden, MD. PMH: Cardiac  CAD: 1995 s/p BMS;  b. 2000 s/p BMS;  c. 2005 s/p DES- All stents in [facility] (RCA, LAD & OM - unknown on which date);   04/2015: Type II MI in setting of  cholecystitis and sepsis) Cath (in the setting of: LM nl, ost/p LAD 20%, patent mLAD stent, RI small, OM2 patent stent, pRCA 20%, patent stent,>Med Rx.  (See below) Mild ICM-EF 45 to 50% - resolved; Echo EF 07/2021 55-60% PAROXYSMAL ATRIAL FIBRILLATION - 04/2015-in the setting of cholecystitis and sepsis HTN, HLD => troubled by orthostatic hypotension and bradycardia Mild OSA-not on CPAP PAF-during sepsis History of Polio with Post-Polio Syndrome/neuropathy- - Bilateral Leg involvement -> walks with ankle to knee braces for support and bilateral hand crutches Chronic Hepatobiliary Disease with recurrent choledocholithiasis /recurrent cholangitis-status postcholecystectomy with several biliary stent placements.     BOL MASIELLO was last seen on Oct 13, 2021 following multiple different hospitalizations for; gyrus with bile duct obstruction requiring ERCP.  He had finally stopped the beta-blocker due to extreme fatigue and was taking 12.5 mg Lopressor Owen once a day.  Even that made him fatigued.  It was finally decided to discontinue.  No other antihypertensives were restarted.  He felt much better with better energy level.  Denied any chest pain or pressure.  Was trying  to, but not doing a good job hydrating himself.  He is concerned about hydration is not being adhered to the bathroom quickly with his crutches.  Mild exertional dyspnea.  No chest pain. Considered possibly reducing statin to 20 mg. No longer on DOAC with A-fib due to bleeding issues.  No longer on rate control agent due to bradycardia and fatigue.  Subjective  Discussed the use of AI scribe software for clinical note transcription with the patient, who gave verbal consent to proceed.  History of Present Illness   Alexander [name] was recently diagnosed with pulmonary fibrosis and is now on supplemental oxygen.  He patient had a bronchoscopy in the summer, which confirmed interstitial lung disease and pulmonary fibrosis. He also had a bout of COVID-19 a year ago, which may have exacerbated his lung condition.  He reports inconsistent use of oxygen therapy. He wears it when active, such as when walking or going outside, and during sleep with a CPAP machine. However, he does not use it while at rest indoors. The patient has been experiencing a persistent dry cough, which has been severe enough to cause oxygen levels to drop to around 80. He also reports occasional chest discomfort, described as a cramp-like sensation, but it is unclear whether this is muscular or related to his lung or heart condition.  The patient has been on prednisone, which improved his condition, but was switched to CellCept due to concerns about muscle mass loss, given his history of polio. He is currently weaning off prednisone. He  also takes Bactrim due to the immunosuppressive effects of CellCept.  The patient has a history of polio and has been experiencing prominent veins, which may suggest some back pressure from the lungs. He also reports occasional sharp pains in the head and tinnitus, which have been more noticeable since starting CellCept. He has not noticed any significant weight gain.  From a Cardiac Standpoint, his heart function  appears to be stable, with no reported swelling in the legs or other symptoms of heart failure. However, he does experience a rapid heart rate when needing to use the bathroom, which he attributes to anxiety about making it to the bathroom on time. He is not diabetic, and his cholesterol levels have been well controlled. No recent SSx to suggest recurrent Afib. NO chest pain or pressure at rest or with exertion.  NO PND or orthopnea or edema but does note that the veins in his hands are engorged.   No syncope/near syncope or TIA/amaurosis fugax.      Cardiovascular ROS: positive for - edema, rapid heart rate, and shortness of breath negative for - chest pain, edema, irregular heartbeat, orthopnea, palpitations, paroxysmal nocturnal dyspnea, or syncope or near syncope, TIA or amaurosis fugax, claudication  ROS:  Review of Systems - Negative except dry cough & dyspnea -- New Dx of IPF, HA & tinnitus, more lethargic & Energy down with IPF    Objective   Studies Reviewed: Marland Kitchen   EKG Interpretation Date/Time:  Thursday April 01 2023 08:00:45 EDT Ventricular Rate:  64 PR Interval:  174 QRS Duration:  124 QT Interval:  462 QTC Calculation: 476 R Axis:   -2  Text Interpretation: Normal sinus rhythm Right bundle branch block Minimal voltage criteria for LVH, may be normal variant ( R in aVL ) with repolarization abnormality T wave abnormality, consider inferior ischemia When compared with ECG of 29-Oct-2022 06:35, Right bundle branch block is now complete Otherwise no significant change Confirmed by Bryan Lemma ([id number]) on 04/01/2023 8:06:16 AM    Lab Results  Component Value Date   CHOL 107 11/04/2022   HDL 45.00 11/04/2022   LDLCALC 43 11/04/2022   LDLDIRECT 213.1 06/29/2013   TRIG 97.0 11/04/2022   CHOLHDL 2 11/04/2022   .  ECHO 08/23/2020: EF 55 to 60%.  No RWMA.  Mild LVH.  GR 1 DD.  Normal RV size and function with normal RVP.  Normal mitral and aortic valve.  (Resolved from EF 40 to  45% in November 2016) CATH:  a. 1995 s/p BMS; b. 2000 s/p BMS; c. 2005 s/p stent - All stents in [facility] (RCA, LAD & OM - unknown on which date); d. 04/2015 NSTEMI/Cath: LM nl, ost/p LAD 20%, patent mLAD stent, RI small, OM2 patent stent, pRCA 20%, patent stent, EF 45-50%-->Med Rx.  Cardiac Cath 04/30/2015: LM nl, ost/p LAD 20%, patent mLAD stent, RI small, OM2 patent stent, pRCA 20%, patent stent, EF 45-50%-->Med Rx    Risk Assessment/Calculations:    No recurrent Afib.  CHA2DS2-VASc Score = 3   This indicates a 3.2% annual risk of stroke. [name] score is based upon: CHF History: 0 HTN History: 1 Diabetes History: 0 Stroke History: 0 Vascular Disease History: 1 Age Score: 1 Gender Score: 0   Not on DOAC - b/c no recurrence.           Physical Exam:   VS:  BP 122/76 (BP Location: Left Arm, Patient Position: Sitting, Cuff Size: Large)   Pulse 64  Ht 5\' 10"  (1.778 m)   Wt 227 lb 6.4 oz (103.1 kg)   SpO2 94%   BMI 32.63 kg/m    Wt Readings from Last 3 Encounters:  04/01/23 227 lb 6.4 oz (103.1 kg)  03/18/23 224 lb (101.6 kg)  01/28/23 206 lb (93.4 kg)   - measured with & without braces & equipment -- home wgt same -- 202 lb @ home.  GEN: Well nourished, well developed in no acute distress; Robust appearing - but notably weaker.  NECK: No JVD; No carotid bruits CARDIAC: Normal S1, S2; RRR, no murmurs, rubs, gallops RESPIRATORY:  Clear to auscultation without rales, wheezing or rhonchi ; nonlabored, good air movement. ABDOMEN: Soft, non-tender, non-distended EXTREMITIES:  No edema; - both legs in braces, walks with cane.    ASSESSMENT AND PLAN: .    Problem List Items Addressed This Visit       Cardiology Problems   CAD S/P percutaneous coronary angioplasty - Primary (Chronic)    Cardiac Cath 04/30/2015: LM nl, ost/p LAD 20%, patent mLAD stent, RI small, OM2 patent stent, pRCA 20%, patent stent, EF 45-50%-->Med Rx  Doing well with no active anginal symptoms.   Stents in all 3 major arteries.  Plan: Continue aspirin and statin but reduce rosuvastatin down to 20 mg daily because of fatigue. Not on beta-blocker because of fatigue Of the antihypertensives because of borderline pressures.      Relevant Medications   rosuvastatin (CRESTOR) 20 MG tablet   Other Relevant Orders   EKG 12-Lead (Completed)   ECHOCARDIOGRAM COMPLETE   Essential hypertension (Chronic)    Essentially controlled pressures on no meds.      Relevant Medications   rosuvastatin (CRESTOR) 20 MG tablet   Hyperlipidemia with target LDL less than 70 (Chronic)    LDL is well controlled at 43. Patient is currently on Crestor. -Reduce Crestor dose to half a tablet daily for a trial period of two weeks. -If no significant change in energy levels, return to full dose. -Check cholesterol levels in three months to ensure LDL remains below 70.      Relevant Medications   rosuvastatin (CRESTOR) 20 MG tablet   Other Relevant Orders   Lipid panel   PAF (paroxysmal atrial fibrillation) (HCC); CHA2DS2VASc = 4--> Eliquis. (Chronic)    As far as I can tell he has not had any further episodes of A-fib since that 1 episode of sepsis.  As such, and he has not been on DOAC despite elevated CHA2DS2-VASc score.  Not on rate control agent because of bradycardia and fatigue      Relevant Medications   rosuvastatin (CRESTOR) 20 MG tablet   Other Relevant Orders   ECHOCARDIOGRAM COMPLETE     Other   Dyspnea on exertion    Based on symptoms, I think this is more consistent with symptoms of his IPF.  No actual CHF sounding symptoms.  However we will recheck a 2D echo just to assess EF and filling pressures as well as PAP.      History of myocardial infarction due to demand ischemia (Chronic)    Distant history of elevated troponin levels in the setting of sepsis.  No symptoms of ACS or MI.  Relative preserved EF of 40 to 45%.  Stable.      Interstitial lung disease (HCC) (Chronic)     Pulmonary Fibrosis Patient is on oxygen therapy but not consistently using it at home. No chest pain reported. Noted to have a persistent dry  cough. Currently on CellCept for lung disease and weaning off prednisone. -Encourage consistent use of oxygen therapy at home. -Plan is to Wean off prednisone & Continue CellCept as prescribed. -PCCM plan to follow pulmonary function test to assess current lung function. Possible Pulmonary Hypertension Noted distended veins, suggesting possible back pressure from the lungs. No swelling or heart failure symptoms reported. -Order echocardiogram to assess right heart function and possible impact of pulmonary fibrosis.      Postural dizziness (Chronic)    No more episodes since stopping ACE inhibitor and beta-blocker.  Encourage adequate hydration.              Follow-Up: Return in about 9 months (around 12/30/2023) for 9 month follow-up with me, Follow-up in [place].Plan for follow-up appointment in the summer, approximately one month after labs are done with another provider.   Total time spent: 22  min spent with patient + 22 min spent charting = 44 min      Signed, Marykay Lex, MD, MS Bryan Lemma, M.D., M.S. Interventional Cardiologist  [facility]  Pager # [phone] Phone # [phone] [address] [place], [place] [address]

## 2023-04-01 NOTE — Assessment & Plan Note (Signed)
Cardiac Cath 04/30/2015: LM nl, ost/p LAD 20%, patent mLAD stent, RI small, OM2 patent stent, pRCA 20%, patent stent, EF 45-50%-->Med Rx  Doing well with no active anginal symptoms.  Stents in all 3 major arteries.  Plan: Continue aspirin and statin but reduce rosuvastatin down to 20 mg daily because of fatigue. Not on beta-blocker because of fatigue Of the antihypertensives because of borderline pressures.

## 2023-04-01 NOTE — Assessment & Plan Note (Signed)
Distant history of elevated troponin levels in the setting of sepsis.  No symptoms of ACS or MI.  Relative preserved EF of 40 to 45%.  Stable.

## 2023-04-01 NOTE — Assessment & Plan Note (Signed)
As far as I can tell he has not had any further episodes of A-fib since that 1 episode of sepsis.  As such, and he has not been on DOAC despite elevated CHA2DS2-VASc score.  Not on rate control agent because of bradycardia and fatigue

## 2023-04-01 NOTE — Assessment & Plan Note (Signed)
Essentially controlled pressures on no meds.

## 2023-04-02 ENCOUNTER — Ambulatory Visit (INDEPENDENT_AMBULATORY_CARE_PROVIDER_SITE_OTHER): Payer: Medicare Other | Admitting: Internal Medicine

## 2023-04-02 DIAGNOSIS — J849 Interstitial pulmonary disease, unspecified: Secondary | ICD-10-CM | POA: Diagnosis not present

## 2023-04-02 LAB — PULMONARY FUNCTION TEST
DL/VA % pred: 68 %
DL/VA: 2.73 ml/min/mmHg/L
DLCO cor % pred: 37 %
DLCO cor: 9.5 ml/min/mmHg
DLCO unc % pred: 37 %
DLCO unc: 9.61 ml/min/mmHg
FEF 25-75 Pre: 2.9 L/s
FEF2575-%Pred-Pre: 124 %
FEV1-%Pred-Pre: 65 %
FEV1-Pre: 2.07 L
FEV1FVC-%Pred-Pre: 117 %
FEV6-%Pred-Pre: 59 %
FEV6-Pre: 2.41 L
FEV6FVC-%Pred-Pre: 106 %
FVC-%Pred-Pre: 55 %
FVC-Pre: 2.41 L
Pre FEV1/FVC ratio: 86 %
Pre FEV6/FVC Ratio: 100 %

## 2023-04-02 NOTE — Progress Notes (Signed)
Spirometry/DLCO performed today. 

## 2023-04-02 NOTE — Patient Instructions (Signed)
Spirometry/DLCO performed today. 

## 2023-04-06 ENCOUNTER — Other Ambulatory Visit: Payer: Self-pay | Admitting: Internal Medicine

## 2023-04-06 DIAGNOSIS — Z79899 Other long term (current) drug therapy: Secondary | ICD-10-CM

## 2023-04-06 DIAGNOSIS — J849 Interstitial pulmonary disease, unspecified: Secondary | ICD-10-CM

## 2023-04-06 DIAGNOSIS — J679 Hypersensitivity pneumonitis due to unspecified organic dust: Secondary | ICD-10-CM

## 2023-04-06 NOTE — Telephone Encounter (Signed)
[facility] on [address]  mycophenolate (CELLCEPT) 500 MG tablet   Only has a couple days left of medication. PT states he needs it ASAP. Thanks.

## 2023-04-06 NOTE — Telephone Encounter (Signed)
Lm for patient.  According to last OV note, patient was to continue Cellcept at 1500mg  BID.  MR, please verify if this dose is correct?

## 2023-04-08 ENCOUNTER — Telehealth: Payer: Medicare Other | Admitting: Internal Medicine

## 2023-04-08 DIAGNOSIS — J849 Interstitial pulmonary disease, unspecified: Secondary | ICD-10-CM | POA: Diagnosis not present

## 2023-04-08 DIAGNOSIS — Z79899 Other long term (current) drug therapy: Secondary | ICD-10-CM | POA: Diagnosis not present

## 2023-04-08 DIAGNOSIS — Z2989 Encounter for other specified prophylactic measures: Secondary | ICD-10-CM

## 2023-04-08 DIAGNOSIS — J679 Hypersensitivity pneumonitis due to unspecified organic dust: Secondary | ICD-10-CM

## 2023-04-08 DIAGNOSIS — Z7189 Other specified counseling: Secondary | ICD-10-CM

## 2023-04-08 MED ORDER — AMOXICILLIN-POT CLAVULANATE 875-125 MG PO TABS: 1.0000 | ORAL_TABLET | Freq: Two times a day (BID) | ORAL | 0 refills | Status: DC

## 2023-04-08 MED ORDER — MYCOPHENOLATE MOFETIL 500 MG PO TABS: 1500.0000 mg | ORAL_TABLET | Freq: Two times a day (BID) | ORAL | 3 refills | Status: DC

## 2023-04-08 NOTE — Progress Notes (Signed)
72yM with history of CAD, HTN, ICM, OSA not on CPAP, pAF, very light smoking in past. Had covid-19 a year ago in January was not hospitalized with it.   Coughing, short of breath, little energy over last 4 months. Cough is dry occasionally productive of sputum but he just swallows it. He does have sinus congestion and postnasal drainage. He tried flonase on and off since November. Benadryl here and there. He is on omeprazole BID for reflux. No solid/liquid food dysphagia.   Got steroid course in October/Nov, steroids did not help. Had another round of steroids/antibiotic which did not help.   Had polio as a kid and started to need braces/crutches in 2002.   Some morning stiffness but takes 10-15 min to loosen up, no rashes, raynaud's phenomenon, ulcers in mouth or nose.   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 Some exposure to cleaning agents in past, some solvents. No MJ, vaping. Born in [place], lived for 40y in [place] and then was in [place] in [place], moved to [place]. 3 dogs. No hot tub.   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 Thora Lance  Time of Conference: 7.00am- 8.00am Date of conference: 09/08/2022 Location of Conference: -  Virtual  Participating Pulmonary: Dr. Kalman Shan, Dr Chilton Greathouse, Dr. Felisa Bonier Pathology: Dr Holley Bouche Radiology: Dr Lauralyn Primes  Others:   Brief History: 72 year old was on amio for 6 months. Has post polio syndrome. Has severe GERD . [place] a LOT of GARDENING. When lived in [place] it was damp. Serology negative. 5-6 months of symptoms. HAd mild covid a  year ago. Does this seem to be consistent with chronic hp from dust exposure enough to treat empirically?   Should I alternatively send for surgical lung biopsy or instead consider bronchoscopy/BAL for cell count/diff first?   MDD discussion of CT scan    - Date or time period of scan: HRCT: 07/24/2022   - Discussion synopsis:  clearest case of alternative diagnosis. Has quite a bit of traction bronchiectasis.  There is lot of air trapping . Upper lobe predminant.   - What is the final conclusion per 2018 ATS/Fleischner Criteria - Leading differential is Chronic HP  - Concordance with official report: CONCORDANT  Pathology discussion of biopsy: No lung biopsy    MDD Impression/Recs: Suspect chronic HP ., REC a) send HP panel; b) ILD questionnaire; c) shared decision making for bronch BAL/Ttbx for histopath and envisia  OV May 2024   Interval HPI:  ILD conference recommends shared decision-making discussion wrt bronch/BAL/TBLB  PSG with moderate OSA AHI of 20 and SpO2 low of 75%  Does wear mask when he heads out to do yardwork.  Overall feels more energetic than at last visit.   Otherwise pertinent review of systems is negative.  xxxxxxxxxxxxxxxxxxxxxxxxxxxxxxxxxxxxxxxxxxxxxxxxxxxxxxxxxxxxx Interval HPI: June 2024  Underwent bronch/BAL/tblb 5/30. BAL diff with 18% lymphs, 11% eos.  NEGATIVE ENVISIA. Non-diagnostic TTBX for Histo pat  Gabapentin, ibuprofen with case report of eosinophilic pna but not this pattern Rosuvastatin with ILD but not this pattern  Overall still with a lack of energy. No change in DOE, cough. Has not yet set up appointment to get  autoPAP, playing phone tag with [employer].   Otherwise pertinent review of systems is negative.  - Start prednisone 30 mg daily - see instructions for taper  - Spirometry next visit - see you in 2-3 months or sooner if need be!  OV 01/28/2023 -transfer of care from Dr. Felisa Bonier to Dr. Marchelle Gearing in the ILD center.  Subjective:  Patient ID: Alexander [name], male , DOB: 01-09-1951 , age 72 y.o. , MRN: [id number] , ADDRESS: [address] [place] [address] PCP Eustaquio Boyden, MD Patient Care Team: Eustaquio Boyden, MD as PCP - General (Family Medicine) Marykay Lex, MD as PCP - Cardiology (Cardiology) Marykay Lex, MD as Consulting Physician (Cardiology) Merwyn Katos, MD (Inactive) as Consulting Physician (Pulmonary Disease)  This Provider for this visit: Treatment Team:  Attending Provider: Kalman Shan, MD    01/28/2023 -   Chief Complaint  Patient presents with   Consult    Former pt of Dr. Thora Lance.  Discuss labs and cxr/scan     HPI Alexander [name] 72 y.o. -presents for follow-up.  History is provided by reviewing of the prior records and also talking to his wife and talking to him   #background: He grew up in [place].  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  After that he recovered he just walked with a limp.  1970 he moved to [facility] and worked in the casino till 2012.  He also then worked for [employer]  during the same time.Marland Kitchen  He is to smoke occasionally during this time.  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  No visible mold.  He then retired and then moved to [place] because his wife is originally from [place] and his sister-in-law lives in this area.  In the last few years his polio has flared up and is now using crutches.  #ILD reviewed the CT scans of the chest suggest  that he had ILD even in 2016 and even in  and 2022.  In early 2023 he suffered from COVID-19.  Then by October 2023 started having a cough and then established with Dr. Felisa Bonier in early 2024.  In my personal visualization the ILD has progressed.  In conference they have raised the specter of hypersensitive pneumonitis because of air trapping and the features.  I personally agree with this.  His main symptoms was cough followed by shortness of breath.  During this time he had a bronchoscopy with lavage is mixed cellularity.  Nondiagnostic on RNA genomic classifier and histopathology transbronchial biopsies.  However this does not preclude chronic HP.  His serologies are essentially normal.  By mid June 2024 after the ILD conference started on prednisone 30 mg/day.  He slowly taper this and currently on 10 mg.  The cough resolved but according to his wife is coming back now at the lower dose of prednisone.  Uses oxygen for the last 1  month.  Today room air at rest was 94%.       OV 03/18/2023  Subjective:  Patient ID: Alexander [name], male , DOB: [birth year] , age 25 y.o. , MRN: [id number] , ADDRESS: [address] [place] [address] PCP Eustaquio Boyden, MD Patient Care Team: Eustaquio Boyden, MD as PCP - General (Family Medicine) Marykay Lex, MD as PCP - Cardiology (Cardiology) Marykay Lex, MD as Consulting Physician (Cardiology) Merwyn Katos, MD (Inactive) as Consulting Physician (Pulmonary Disease)  This Provider for this visit: Treatment Team:  Attending Provider: Kalman Shan, MD    03/18/2023 -   Chief Complaint  Patient presents with   Follow-up    ILD- Doing ok SOB is getting worse and his cough is worse now that he is done with prednisone. UTD on flu, covid, pneumo and rsv vaccines. Wants to go over labs. Lots of mucous.  Before sit stand O2 on room air 94 pulse of 101. After sit stand O2 was 82 and pulse was 129.    HPI Alexander [name] 72 y.o. -returns for follow-up.    #Interstitial lung disease and chronic hypersensitive pneumonitis associate with high risk prescription and encounter for therapeutic monitoring  -Oxygen requirement is the same.  Symptom scores around stable but he does tell me that and his wife also affirms that since going up on the CellCept 1000 mg twice daily he is a little more fatigued.  This also corresponds with him going down on the prednisone to 10 mg/day.  Took a shared decision making for him to hold at this dose without having to either taper the prednisone further or escalate the CellCept to 1500 mg twice daily.  He is okay with this.  Today we will get safety labs.  He continues on his 2 L oxygen with exercise and at night.  Unclear if he stopped his fish oil  #New complaint: He is a lot of grandkids around him and he feels because of this exposure he is having chronic sinus complaint.  For the last few days it is also worse with greenish drainage.  He is already on Bactrim for PCP prophylaxis.       OV 04/08/2023  Subjective:  Patient ID: Alexander [name], male , DOB: [birth year] , age 52 y.o. , MRN: [id number] , ADDRESS: [address] [place] [address] PCP Eustaquio Boyden, MD Patient Care Team: Eustaquio Boyden, MD as PCP - General (Family Medicine) Marykay Lex, MD as PCP - Cardiology (Cardiology) Marykay Lex, MD as Consulting Physician (Cardiology) Merwyn Katos, MD (Inactive) as Consulting Physician (Pulmonary Disease)  This Provider for this visit: Treatment Team:  Attending Provider: Kalman Shan, MD   Interstitial lung disease chronic hypersensitive pneumonitis  -Etiology mold exposure while working in [employer] many years ago  -Diagnosis given end of August 2024  -Progressive phenotype with COVID in 2022 stirring the pot   -   on prednisone since mid June 2024   -Started CellCept and prophylactic Bactrim mid September 2024  Sleep  apnea: CPAP compliance report July 2024 through mid October 2024: 77% compliance for greater than 4 hours and 19% compliance for less than 4 hours a total of 96% compliance.   #cellcept monitoring: Immunosuppressive medication that causes significant side effects along with liver kidney injury and opportunistic infections hypotension, chest pain, edema, tachycardia, headache, insomnia, fever anxiety rash diarrhea thrombocytopenia anemia   04/08/2023 -  followup sinus copmplaints  Type of visit: Video Virtual Visit Identification of patient Alexander [name] with Dec 26, 1950 and MRN [id number] - 2 person identifier Risks: Risks, benefits, limitations of telephone visit explained. Patient understood and verbalized agreement to proceed Anyone else on call: wife Patient location: his home This provider location: [address], [address]; [place]; [place] [address]. [facility]. [phone]   HPI Alexander [name] 72 y.o. -presents with his video visit.  His cardiac event did not work and therefore the visit was done for the audio portion of the telephone and for the video portion through the video camera.  He tells me his fatigue is stable.  He is on CellCept 1500 mg twice daily and also prednisone 10 mg/day.  He is on Bactrim prophylaxis.  At last visit I gave him doxycycline for a sinus complaints this help with the sinus complaints of worse again.  We did a RAST allergy panel and this was normal.  The blood eosinophil normal.  We did a CT sinus. I visualized it like could not make my own interpretation confidently but seems sinuses were ok.  The official report is pending.  They are frustrated by the delay.  Given the fact that his sinus complaints were worse again he is agreed to do some Augmentin short course.  Depending abnormalities of the CT sinus I will refer him to ENT.    But in terms of his ILD: He is stable.  He is FVC stable but his DLCO seems declined although recent BNP  is normal.  I do not know what to make of it because his symptoms are actually stable and his FVC stable.  He is willing to be seen again in a few months approximately 9 weeks with spirometry and DLCO.  Therapeutic drug monitoring with CellCept: No real adverse events.  Last blood test was a few weeks ago advised him to get blood test in the next week or 2.  He will do CellCept refill to be sent.     SYMPTOM SCALE - ILD 01/28/2023 03/18/2023   Current weight Ra 94%   O2 use 2L   Shortness of Breath 0 -> 5 scale with 5 being worst (score 6 If unable to do)   At rest 3 1  Simple tasks - showers, clothes change, eating, shaving 3 3  Household (dishes, doing bed, laundry) 3 3  Shopping 4 1  Walking level at own pace 5 5  Walking up Stairs 5 5  Total (30-36) Dyspnea Score 23 18  How bad is your cough? 3 4  How bad is your fatigue 3 4  How bad is nausea 0 0  How bad is vomiting?  0 0  How bad is diarrhea? 0 1  How bad is anxiety? 0 0  How bad is depression 0 0  Any chronic pain - if so where and how bad 0     Simple office walk 224 (66+46 x 2) feet Pod A at [facility] x  3 laps goal with forehead probe 03/18/2023    O2 used ra   Number laps completed Sit stand x 15   Comments about pace Nrmal pace   Resting Pulse Ox/HR 94% and 102/min   Final Pulse Ox/HR 82% and 129/min   Desaturated </= 88% yes   Desaturated <= 3% points yes   Got Tachycardic >/= 90/min yes   Symptoms at end of test Dyspnea    Miscellaneous comments x  PFT     Latest Ref Rng & Units 04/02/2023   10:40 AM 08/21/2022   10:42 AM 08/01/2015    2:22 PM  ILD indicators  FVC-Pre L 2.41  2.42  5.42  P  FVC-Predicted Pre % 55  55  113  P  FVC-Post L  2.12  5.35  P  FVC-Predicted Post %  48  112  P  TLC L  4.08  7.55  P  TLC Predicted %  58  104  P  DLCO uncorrected ml/min/mmHg 9.61  17.86  20.37  P  DLCO UNC %Pred % 37  69  60  P  DLCO Corrected ml/min/mmHg 9.50  17.86    DLCO COR %Pred % 37  69       P Preliminary result      LAB RESULTS last 96 hours No results found.  LAB RESULTS last 90 days Recent Results (from the past 2160 hour(s))  Glucose 6 phosphate dehydrogenase     Status: None   Collection Time: 01/28/23  3:29 PM  Result Value Ref Range   G-6PDH 14.1 7.0 - 20.5 U/g Hgb  QuantiFERON-TB Gold Plus     Status: None   Collection Time: 01/28/23  3:29 PM  Result Value Ref Range   QuantiFERON-TB Gold Plus NEGATIVE NEGATIVE    Comment: Negative test result. M. tuberculosis complex  infection unlikely.    NIL 0.07 IU/mL   Mitogen-NIL >10.00 IU/mL   TB1-NIL 0.03 IU/mL   TB2-NIL 0.11 IU/mL    Comment: . The Nil tube value reflects the background interferon gamma immune response of the patient's blood sample. This value has been subtracted from the patient's displayed TB and Mitogen results. . Lower than expected results with the Mitogen tube prevent false-negative Quantiferon readings by detecting a patient with a potential immune suppressive condition and/or suboptimal pre-analytical specimen handling. . The TB1 Antigen tube is coated with the M. tuberculosis-specific antigens designed to elicit responses from TB antigen primed CD4+ helper T-lymphocytes. . The TB2 Antigen tube is coated with the M. tuberculosis-specific antigens designed to elicit responses from TB antigen primed CD4+ helper and CD8+ cytotoxic T-lymphocytes. . For additional information, please refer to https://education.[facility].com/faq/FAQ204 (This link is being provided for informational/ educational purposes only.) .   B Nat Peptide     Status: None   Collection Time: 01/28/23  3:29 PM  Result Value Ref Range   Pro B Natriuretic peptide (BNP) 32.0 0.0 - 100.0 pg/mL  Hepatic function panel     Status: None   Collection Time: 01/28/23  3:29 PM  Result Value Ref Range   Total Bilirubin 0.7 0.2 - 1.2 mg/dL   Bilirubin, Direct 0.1 0.0 - 0.3 mg/dL   Alkaline Phosphatase 42 39  - 117 U/L   AST 14 0 - 37 U/L   ALT 25 0 - 53 U/L   Total Protein 7.0 6.0 - 8.3 g/dL   Albumin 4.1 3.5 - 5.2 g/dL  Basic Metabolic Panel (BMET)     Status: Abnormal   Collection Time: 01/28/23  3:29 PM  Result Value Ref Range   Sodium 141 135 - 145 mEq/L   Potassium 4.3 3.5 - 5.1 mEq/L   Chloride 104 96 - 112 mEq/L   CO2 28 19 - 32 mEq/L   Glucose, Bld 112 (H) 70 - 99 mg/dL   BUN 20 6 - 23 mg/dL   Creatinine, Ser 1.61 0.40 - 1.50 mg/dL   GFR 09.60 >45.40 mL/min  Comment: Calculated using the CKD-EPI Creatinine Equation (2021)   Calcium 9.4 8.4 - 10.5 mg/dL  CBC with Differential     Status: Abnormal   Collection Time: 01/28/23  3:29 PM  Result Value Ref Range   WBC 9.4 4.0 - 10.5 K/uL   RBC 4.51 4.22 - 5.81 Mil/uL   Hemoglobin 15.0 13.0 - 17.0 g/dL   HCT 95.6 21.3 - 08.6 %   MCV 100.8 (H) 78.0 - 100.0 fl   MCHC 33.1 30.0 - 36.0 g/dL   RDW 57.8 46.9 - 62.9 %   Platelets 233.0 150.0 - 400.0 K/uL   Neutrophils Relative % 74.0 43.0 - 77.0 %   Lymphocytes Relative 18.0 12.0 - 46.0 %   Monocytes Relative 6.8 3.0 - 12.0 %   Eosinophils Relative 0.5 0.0 - 5.0 %   Basophils Relative 0.7 0.0 - 3.0 %   Neutro Abs 7.0 1.4 - 7.7 K/uL   Lymphs Abs 1.7 0.7 - 4.0 K/uL   Monocytes Absolute 0.6 0.1 - 1.0 K/uL   Eosinophils Absolute 0.0 0.0 - 0.7 K/uL   Basophils Absolute 0.1 0.0 - 0.1 K/uL  CBC with Differential/Platelet     Status: Abnormal   Collection Time: 03/03/23 11:30 AM  Result Value Ref Range   WBC 8.6 4.0 - 10.5 K/uL   RBC 4.42 4.22 - 5.81 Mil/uL   Hemoglobin 15.0 13.0 - 17.0 g/dL   HCT 52.8 41.3 - 24.4 %   MCV 101.5 (H) 78.0 - 100.0 fl   MCHC 33.5 30.0 - 36.0 g/dL   RDW 01.0 27.2 - 53.6 %   Platelets 286.0 150.0 - 400.0 K/uL   Neutrophils Relative % 78.6 (H) 43.0 - 77.0 %   Lymphocytes Relative 18.0 12.0 - 46.0 %   Monocytes Relative 3.0 3.0 - 12.0 %   Eosinophils Relative 0.1 0.0 - 5.0 %   Basophils Relative 0.3 0.0 - 3.0 %   Neutro Abs 6.7 1.4 - 7.7 K/uL   Lymphs  Abs 1.5 0.7 - 4.0 K/uL   Monocytes Absolute 0.3 0.1 - 1.0 K/uL   Eosinophils Absolute 0.0 0.0 - 0.7 K/uL   Basophils Absolute 0.0 0.0 - 0.1 K/uL  Comp Met (CMET)     Status: Abnormal   Collection Time: 03/03/23 11:31 AM  Result Value Ref Range   Sodium 142 135 - 145 mEq/L   Potassium 4.8 3.5 - 5.1 mEq/L   Chloride 106 96 - 112 mEq/L   CO2 28 19 - 32 mEq/L   Glucose, Bld 144 (H) 70 - 99 mg/dL   BUN 14 6 - 23 mg/dL   Creatinine, Ser 6.44 0.40 - 1.50 mg/dL   Total Bilirubin 0.8 0.2 - 1.2 mg/dL   Alkaline Phosphatase 48 39 - 117 U/L   AST 15 0 - 37 U/L   ALT 16 0 - 53 U/L   Total Protein 7.7 6.0 - 8.3 g/dL   Albumin 4.3 3.5 - 5.2 g/dL   GFR 03.47 >42.59 mL/min    Comment: Calculated using the CKD-EPI Creatinine Equation (2021)   Calcium 9.8 8.4 - 10.5 mg/dL  Perennial allergen profile IgE     Status: None   Collection Time: 03/18/23  2:52 PM  Result Value Ref Range   Class Description Allergens Comment     Comment:     Levels of Specific IgE       Class  Description of Class     ---------------------------  -----  --------------------                    <  0.10         0         Negative            0.10 -    0.31         0/I       Equivocal/Low            0.32 -    0.55         I         Low            0.56 -    1.40         II        Moderate            1.41 -    3.90         III       High            3.91 -   19.00         IV        Very High           19.01 -  100.00         V         Very High                   >100.00         VI        Very High    D Pteronyssinus IgE <0.10 Class 0 kU/L   D Farinae IgE <0.10 Class 0 kU/L   Cat Dander IgE <0.10 Class 0 kU/L   Dog Dander IgE <0.10 Class 0 kU/L   Cow Dander IgE <0.10 Class 0 kU/L   Goose Feathers IgE <0.10 Class 0 kU/L   Chicken Feathers IgE <0.10 Class 0 kU/L   Duck Feathers IgE <0.10 Class 0 kU/L   Penicillium Chrysogen IgE <0.10 Class 0 kU/L   Cladosporium Herbarum IgE <0.10 Class 0 kU/L   Aspergillus Fumigatus IgE  <0.10 Class 0 kU/L   Mucor Racemosus IgE <0.10 Class 0 kU/L   Candida Albicans IgE <0.10 Class 0 kU/L   Alternaria Alternata IgE <0.10 Class 0 kU/L   Setomelanomma Rostrat <0.10 Class 0 kU/L   Aureobasidi Pullulans IgE <0.10 Class 0 kU/L   Phoma Betae IgE <0.10 Class 0 kU/L   Stemphylium Herbarum IgE <0.10 Class 0 kU/L   Mouse Urine IgE <0.10 Class 0 kU/L  Hepatic function panel     Status: None   Collection Time: 03/18/23  2:52 PM  Result Value Ref Range   Total Bilirubin 0.7 0.2 - 1.2 mg/dL   Bilirubin, Direct 0.1 0.0 - 0.3 mg/dL   Alkaline Phosphatase 52 39 - 117 U/L   AST 15 0 - 37 U/L   ALT 17 0 - 53 U/L   Total Protein 7.7 6.0 - 8.3 g/dL   Albumin 4.4 3.5 - 5.2 g/dL  Basic Metabolic Panel (BMET)     Status: Abnormal   Collection Time: 03/18/23  2:52 PM  Result Value Ref Range   Sodium 140 135 - 145 mEq/L   Potassium 4.2 3.5 - 5.1 mEq/L   Chloride 104 96 - 112 mEq/L   CO2 27 19 - 32 mEq/L   Glucose, Bld 126 (H) 70 - 99 mg/dL   BUN 14 6 - 23 mg/dL   Creatinine, Ser 7.82 0.40 - 1.50 mg/dL   GFR 95.62 >  60.00 mL/min    Comment: Calculated using the CKD-EPI Creatinine Equation (2021)   Calcium 9.6 8.4 - 10.5 mg/dL  CBC w/Diff     Status: Abnormal   Collection Time: 03/18/23  2:52 PM  Result Value Ref Range   WBC 9.1 4.0 - 10.5 K/uL   RBC 4.40 4.22 - 5.81 Mil/uL   Hemoglobin 15.0 13.0 - 17.0 g/dL   HCT 33.2 95.1 - 88.4 %   MCV 103.7 (H) 78.0 - 100.0 fl   MCHC 32.9 30.0 - 36.0 g/dL   RDW 16.6 06.3 - 01.6 %   Platelets 236.0 150.0 - 400.0 K/uL   Neutrophils Relative % 77.7 (H) 43.0 - 77.0 %   Lymphocytes Relative 15.2 12.0 - 46.0 %   Monocytes Relative 6.3 3.0 - 12.0 %   Eosinophils Relative 0.3 0.0 - 5.0 %   Basophils Relative 0.5 0.0 - 3.0 %   Neutro Abs 7.1 1.4 - 7.7 K/uL   Lymphs Abs 1.4 0.7 - 4.0 K/uL   Monocytes Absolute 0.6 0.1 - 1.0 K/uL   Eosinophils Absolute 0.0 0.0 - 0.7 K/uL   Basophils Absolute 0.0 0.0 - 0.1 K/uL  Pulmonary function test     Status: None    Collection Time: 04/02/23 10:40 AM  Result Value Ref Range   FVC-Pre 2.41 L   FVC-%Pred-Pre 55 %   FEV1-Pre 2.07 L   FEV1-%Pred-Pre 65 %   FEV6-Pre 2.41 L   FEV6-%Pred-Pre 59 %   Pre FEV1/FVC ratio 86 %   FEV1FVC-%Pred-Pre 117 %   Pre FEV6/FVC Ratio 100 %   FEV6FVC-%Pred-Pre 106 %   FEF 25-75 Pre 2.90 L/sec   FEF2575-%Pred-Pre 124 %   DLCO unc 9.61 ml/min/mmHg   DLCO unc % pred 37 %   DLCO cor 9.50 ml/min/mmHg   DLCO cor % pred 37 %   DL/VA 0.10 ml/min/mmHg/L   DL/VA % pred 68 %         has a past medical history of Acute cholecystitis (03/2016), Arthritis, CAD S/P percutaneous coronary angioplasty (1995, 2000, 2005), Calculus of bile duct with acute cholangitis with obstruction, Chronic pain syndrome, Complication of anesthesia (04/01/2021), Duodenal stricture, Essential hypertension, Fatty liver, Gallstones, GERD (gastroesophageal reflux disease), History of depression, History of post poliomyelitis muscular atrophy, HLD (hyperlipidemia), Ischemic cardiomyopathy, Migraines, Muscle weakness following poliomyelitis, Neuromuscular disorder (HCC), Neuropathy, OSA (obstructive sleep apnea), PAF (paroxysmal atrial fibrillation) (HCC), Pneumonia (04/2015), Septic shock (HCC) (01/07/2018), and Shoulder pain, right.   reports that he quit smoking about 52 years ago. His smoking use included cigarettes. He has never used smokeless tobacco.  Past Surgical History:  Procedure Laterality Date   BALLOON DILATION N/A 06/06/2018   Procedure: BALLOON DILATION;  Surgeon: Mansouraty, Netty Starring., MD;  Location: [facility] ENDOSCOPY;  Service: Gastroenterology;  Laterality: N/A;   BALLOON DILATION N/A 07/06/2018   Procedure: BALLOON DILATION;  Surgeon: Meridee Score Netty Starring., MD;  Location[facility] ENDOSCOPY;  Service: Gastroenterology;  Laterality: N/A;   BALLOON DILATION N/A 07/24/2021   Procedure: BALLOON DILATION;  Surgeon: Meridee Score Netty Starring., MD;  [facility];  Service:  Gastroenterology;  Laterality: N/A;   BILIARY BRUSHING  06/05/2021   Procedure: BILIARY BRUSHING;  Surgeon: Meridee Score Netty Starring., MD;  Location: [facility] ENDOSCOPY;  Service: Gastroenterology;;   BILIARY DILATION  06/05/2021   Procedure: BILIARY DILATION;  Surgeon: Lemar Lofty., MD;  Location: [facility] ENDOSCOPY;  Service: Gastroenterology;;   BILIARY DILATION  09/15/2021   Procedure: BILIARY DILATION;  Surgeon: Lemar Lofty., MD;  Location: [facility] ENDOSCOPY;  Service: Gastroenterology;;   BILIARY STENT PLACEMENT  01/08/2018   Procedure: BILIARY STENT PLACEMENT;  Surgeon: Lynann Bologna, MD;  Location: [facility] ENDOSCOPY;  Service: Endoscopy;;   BILIARY STENT PLACEMENT N/A 02/09/2018   Procedure: BILIARY STENT PLACEMENT;  Surgeon: Lemar Lofty., MD;  Location: [facility] ENDOSCOPY;  Service: Gastroenterology;  Laterality: N/A;   BILIARY STENT PLACEMENT  04/01/2021   Procedure: BILIARY STENT PLACEMENT;  Surgeon: Lynann Bologna, MD;  Location: [facility] ENDOSCOPY;  Service: Endoscopy;;   BIOPSY  06/06/2018   Procedure: BIOPSY;  Surgeon: Lemar Lofty., MD;  Location: [facility] ENDOSCOPY;  Service: Gastroenterology;;   BIOPSY  04/01/2021   Procedure: BIOPSY;  Surgeon: Lynann Bologna, MD;  Location: [facility] ENDOSCOPY;  Service: Endoscopy;;   BIOPSY  06/05/2021   Procedure: BIOPSY;  Surgeon: Lemar Lofty., MD;  Location: [facility] ENDOSCOPY;  Service: Gastroenterology;;   BRONCHIAL BIOPSY  10/29/2022   Procedure: BRONCHIAL BIOPSIES;  Surgeon: [name], MD;  Location: [facility] ENDOSCOPY;  Service: Pulmonary;;   BRONCHIAL WASHINGS  10/29/2022   Procedure: BRONCHIAL WASHINGS;  Surgeon: [name], MD;  Location: [facility] ENDOSCOPY;  Service: Pulmonary;;   BRONCHOSCOPY  09/2022   benign biopsies, negative for eosinophils, granuloma, fungal (Meier)   CARDIAC CATHETERIZATION N/A 04/30/2015   Procedure: Left Heart Cath and Coronary Angiography;  Surgeon: Lennette Bihari, MD;  Location: [facility] INVASIVE CV LAB;   Service: Cardiovascular; LM nl, ost/p LAD 20%, patent mLAD stent, RI small, OM2 patent stent, pRCA 20%, patent stent, EF 45-50%-->Med Rx    cervical spine stimulator  08/2012   CHOLECYSTECTOMY  06/10/2015   Procedure: LAPAROSCOPIC CHOLECYSTECTOMY;  Surgeon: De Blanch Kinsinger, MD;  Location: [facility] OR;  Service: General;;   COLONOSCOPY  09/2013   TA x2, rpt 5 yrs (Pyrtle)   COLONOSCOPY WITH ESOPHAGOGASTRODUODENOSCOPY (EGD)  06/2018   reactive gastropathy, tubular adenoma, rpt colonoscopy 5 yrs (Mansouraty)   COLONOSCOPY WITH PROPOFOL N/A 06/06/2018   Procedure: COLONOSCOPY WITH PROPOFOL;  Surgeon: Lemar Lofty., MD;  Location: [facility] ENDOSCOPY;  Service: Gastroenterology;  Laterality: N/A;   CORONARY ANGIOPLASTY WITH STENT PLACEMENT  1995, 2000, 2005   stents in RCA, LAD & Cx-OM. -Stents were patent by cardiac catheterization in November 2016   ENDOSCOPIC RETROGRADE CHOLANGIOPANCREATOGRAPHY (ERCP) WITH PROPOFOL N/A 02/09/2018   Procedure: ENDOSCOPIC RETROGRADE CHOLANGIOPANCREATOGRAPHY (ERCP) WITH PROPOFOL;  Surgeon: Lemar Lofty., MD;  Location: [facility] ENDOSCOPY;  Service: Gastroenterology;  Laterality: N/A;   ENDOSCOPIC RETROGRADE CHOLANGIOPANCREATOGRAPHY (ERCP) WITH PROPOFOL N/A 03/30/2018   Procedure: ENDOSCOPIC RETROGRADE CHOLANGIOPANCREATOGRAPHY (ERCP) WITH PROPOFOL;  Surgeon: Meridee Score Netty Starring., MD;  Location: [facility] ENDOSCOPY;  Service: Gastroenterology;  Laterality: N/A;   ENDOSCOPIC RETROGRADE CHOLANGIOPANCREATOGRAPHY (ERCP) WITH PROPOFOL N/A 06/05/2021   Procedure: ENDOSCOPIC RETROGRADE CHOLANGIOPANCREATOGRAPHY (ERCP) WITH PROPOFOL;  Surgeon: Meridee Score Netty Starring., MD;  Location: [facility] ENDOSCOPY;  Service: Gastroenterology;  Laterality: N/A;   ENDOSCOPIC RETROGRADE CHOLANGIOPANCREATOGRAPHY (ERCP) WITH PROPOFOL N/A 09/15/2021   Procedure: ENDOSCOPIC RETROGRADE CHOLANGIOPANCREATOGRAPHY (ERCP) WITH PROPOFOL;  Surgeon: Meridee Score Netty Starring., MD;  Location: [facility] ENDOSCOPY;   Service: Gastroenterology;  Laterality: N/A;   ERCP N/A 01/08/2018   Procedure: ENDOSCOPIC RETROGRADE CHOLANGIOPANCREATOGRAPHY (ERCP);  Surgeon: Lynann Bologna, MD;  Location: [facility] ENDOSCOPY;  Service: Endoscopy;  Laterality: N/A;   ERCP N/A 04/01/2021   Procedure: ENDOSCOPIC RETROGRADE CHOLANGIOPANCREATOGRAPHY (ERCP);  Surgeon: Lynann Bologna, MD;  Location: [facility] ENDOSCOPY;  Service: Endoscopy;  Laterality: N/A;  Please schedule after 1 PM   ESOPHAGOGASTRODUODENOSCOPY N/A 03/30/2018   Procedure: ESOPHAGOGASTRODUODENOSCOPY (EGD);  Surgeon: Lemar Lofty.,  MD;  Location: [facility] ENDOSCOPY;  Service: Gastroenterology;  Laterality: N/A;   ESOPHAGOGASTRODUODENOSCOPY (EGD) WITH PROPOFOL N/A 06/06/2018   Procedure: ESOPHAGOGASTRODUODENOSCOPY (EGD) WITH PROPOFOL;  Surgeon: Meridee Score Netty Starring., MD;  Location: [facility] ENDOSCOPY;  Service: Gastroenterology;  Laterality: N/A;   ESOPHAGOGASTRODUODENOSCOPY (EGD) WITH PROPOFOL N/A 07/06/2018   Procedure: ESOPHAGOGASTRODUODENOSCOPY (EGD) WITH PROPOFOL;  Surgeon: Meridee Score Netty Starring., MD;  Location: [facility] ENDOSCOPY;  Service: Gastroenterology;  Laterality: N/A;   ESOPHAGOGASTRODUODENOSCOPY (EGD) WITH PROPOFOL N/A 07/24/2021   Procedure: ESOPHAGOGASTRODUODENOSCOPY (EGD) WITH PROPOFOL WITH FLUOROSCOPY;  Surgeon: Meridee Score Netty Starring., MD;  Location: [facility] ENDOSCOPY;  Service: Gastroenterology;  Laterality: N/A;   EUS N/A 07/24/2021   Procedure: UPPER ENDOSCOPIC ULTRASOUND (EUS) RADIAL;  Surgeon: Lemar Lofty., MD;  Location: [facility] ENDOSCOPY;  Service: Gastroenterology;  Laterality: N/A;   lumbar spine stimulator     POLYPECTOMY  06/06/2018   Procedure: POLYPECTOMY;  Surgeon: Mansouraty, Netty Starring., MD;  Location: [facility] ENDOSCOPY;  Service: Gastroenterology;;   REMOVAL OF STONES  03/30/2018   Procedure: REMOVAL OF STONES;  Surgeon: Lemar Lofty., MD;  Location: [facility] ENDOSCOPY;  Service: Gastroenterology;;   REMOVAL OF STONES  06/05/2021   Procedure:  REMOVAL OF STONES;  Surgeon: Lemar Lofty., MD;  Location: [facility] ENDOSCOPY;  Service: Gastroenterology;;   REMOVAL OF STONES  09/15/2021   Procedure: REMOVAL OF STONES;  Surgeon: Lemar Lofty., MD;  Location: [facility] ENDOSCOPY;  Service: Gastroenterology;;   Dennison Mascot  01/08/2018   Procedure: Dennison Mascot;  Surgeon: Lynann Bologna, MD;  Location: [facility] ENDOSCOPY;  Service: Endoscopy;;   SPHINCTEROTOMY  02/09/2018   Procedure: Dennison Mascot;  Surgeon: Mansouraty, Netty Starring., MD;  Location: [facility] ENDOSCOPY;  Service: Gastroenterology;;   [facility] CHOLANGIOSCOPY N/A 06/05/2021   Procedure: ZOXWRUEA CHOLANGIOSCOPY;  Surgeon: Lemar Lofty., MD;  Location: [facility] ENDOSCOPY;  Service: Gastroenterology;  Laterality: N/A;   STENT REMOVAL  02/09/2018   Procedure: STENT REMOVAL;  Surgeon: Lemar Lofty., MD;  Location: [facility] ENDOSCOPY;  Service: Gastroenterology;;   Francine Graven REMOVAL  03/30/2018   Procedure: STENT REMOVAL;  Surgeon: Lemar Lofty., MD;  Location: [facility] ENDOSCOPY;  Service: Gastroenterology;;   Francine Graven REMOVAL  06/05/2021   Procedure: STENT REMOVAL;  Surgeon: Lemar Lofty., MD;  Location: [facility] ENDOSCOPY;  Service: Gastroenterology;;   TRANSTHORACIC ECHOCARDIOGRAM  04/2015   EF 40-45%, mod antsept, ant, antlat, apical HK, mild AI/MR, PASP .   TRANSTHORACIC ECHOCARDIOGRAM  08/23/2020   EF 55 to 60%.  No RWMA.  Mild LVH.  GR 1 DD.  Normal RV size and function with normal RVP.  Normal mitral and aortic valve.   UVULOPALATOPHARYNGOPLASTY, TONSILLECTOMY AND SEPTOPLASTY  2002   VIDEO BRONCHOSCOPY N/A 10/29/2022   Procedure: VIDEO BRONCHOSCOPY WITH FLUORO;  Surgeon: [name], MD;  Location: [facility] ENDOSCOPY;  Service: Pulmonary;  Laterality: N/A;    No Known Allergies  Immunization History  Administered Date(s) Administered   Fluad Quad(high Dose 65+) 01/20/2022   Influenza, High Dose Seasonal PF 03/04/2016, 02/21/2018, 02/23/2019, 03/11/2021,  02/03/2023   Influenza, Seasonal, Injecte, Preservative Fre 05/03/2012, 05/05/2014, 02/28/2015   Influenza,inj,Quad PF,6+ Mos 02/27/2013   Influenza-Unspecified 03/17/2017   PFIZER Comirnaty(Gray Top)Covid-19 Tri-Sucrose Vaccine 03/23/2020, 09/28/2020   PFIZER(Purple Top)SARS-COV-2 Vaccination 08/25/2019, 09/19/2019   Pfizer(Comirnaty)Fall Seasonal Vaccine 12 years and older 02/03/2023   Pneumococcal Conjugate-13 04/28/2015   Pneumococcal Polysaccharide-23 10/30/2016   Respiratory Syncytial Virus Vaccine,Recomb Aduvanted(Arexvy) 01/20/2022   Td 05/03/2012   Zoster Recombinant(Shingrix) 01/20/2022, 05/01/2022   Zoster, Live 06/29/2013    Family History  Problem Relation Age  of Onset   Diabetes Mother    Heart disease Mother    Cancer Sister 66       male cancer   Breast cancer Brother 62   Hypertension Maternal Aunt    Cancer Maternal Uncle        spinal   Coronary artery disease Neg Hx    Stroke Neg Hx    Colon cancer Neg Hx    Pancreatic cancer Neg Hx    Stomach cancer Neg Hx    Esophageal cancer Neg Hx    Inflammatory bowel disease Neg Hx    Liver disease Neg Hx    Rectal cancer Neg Hx      Current Outpatient Medications:    acetaminophen (TYLENOL) 500 MG tablet, Take 1,000 mg by mouth every 6 (six) hours as needed for moderate pain or headache. , Disp: , Rfl:    Ascorbic Acid (VITAMIN C) 1000 MG tablet, Take 1,000 mg by mouth daily., Disp: , Rfl:    Carboxymethylcellulose Sodium (LUBRICANT EYE DROPS OP), Place 2 drops into both eyes daily as needed (for dry eyes)., Disp: , Rfl:    Cholecalciferol (VITAMIN D3) 50 MCG (2000 UT) TABS, Take 2,000 Units by mouth daily., Disp: , Rfl:    doxycycline (VIBRA-TABS) 100 MG tablet, Take 1 tablet (100 mg total) by mouth 2 (two) times daily., Disp: 10 tablet, Rfl: 0   gabapentin (NEURONTIN) 600 MG tablet, TAKE 2 TABLETS BY MOUTH 3 TIMES  DAILY, Disp: 540 tablet, Rfl: 3   ibuprofen (ADVIL) 200 MG tablet, Take 400 mg by mouth every 6  (six) hours as needed for headache or moderate pain., Disp: , Rfl:    Magnesium 250 MG TABS, Take 1 tablet (250 mg total) by mouth at bedtime. (Patient taking differently: Take 250 mg by mouth at bedtime.), Disp: , Rfl: 0   mirabegron ER (MYRBETRIQ) 25 MG TB24 tablet, Take 1 tablet (25 mg total) by mouth daily., Disp: 30 tablet, Rfl: 11   mycophenolate (CELLCEPT) 500 MG tablet, Take one tab by mouth twice daily for 2 weeks. REPEAT LABS. If stable, increase to 2 tabs twice daily thereafter, Disp: 212 tablet, Rfl: 0   Omega-3 Fatty Acids (FISH OIL) 1000 MG CAPS, Take 1,000 mg by mouth daily. , Disp: , Rfl:    omeprazole (PRILOSEC) 40 MG capsule, Take 1 capsule (40 mg total) by mouth 2 (two) times daily before a meal., Disp: 180 capsule, Rfl: 4   predniSONE (DELTASONE) 10 MG tablet, Take 1 tablet (10 mg total) by mouth daily with breakfast., Disp: 30 tablet, Rfl: 2   rosuvastatin (CRESTOR) 20 MG tablet, Take 1 tablet (20 mg total) by mouth daily., Disp: 90 tablet, Rfl: 3   sildenafil (VIAGRA) 100 MG tablet, TAKE 1/2 TO 1 TABLET BY MOUTH DAILY AS NEEDED FOR ERECTILE DYSFUNCTION (Patient taking differently: Take 50-100 mg by mouth daily as needed for erectile dysfunction.), Disp: 6 tablet, Rfl: 0   sulfamethoxazole-trimethoprim (BACTRIM DS) 800-160 MG tablet, Take 1 tablet by mouth 3 (three) times a week., Disp: 36 tablet, Rfl: 1   tamsulosin (FLOMAX) 0.4 MG CAPS capsule, TAKE 1 CAPSULE BY MOUTH EVERY DAY, Disp: 90 capsule, Rfl: 3   vitamin B-12 (CYANOCOBALAMIN) 1000 MCG tablet, Take 1,000 mcg by mouth daily., Disp: , Rfl:    vitamin E 400 UNIT capsule, Take 400 Units by mouth daily., Disp: , Rfl:       Objective:   There were no vitals filed for this visit.  Estimated  body mass index is 32.63 kg/m as calculated from the following:   Height as of 04/01/23: 5\' 10"  (1.778 m).   Weight as of 04/01/23: 227 lb 6.4 oz (103.1 kg).  @WEIGHTCHANGE @  There were no vitals filed for this visit.    Physical Exam   General: No distress. Loos well O2 at rest: no Cane present: no Sitting in wheel chair: no Frail: no Obese: no Neuro: Alert and Oriented x 3. GCS 15. Speech normal Psych: Pleasant        Assessment:       ICD-10-CM   1. ILD (interstitial lung disease) (HCC)  J84.9     2. Hypersensitivity pneumonitis (HCC)  J67.9     3. Long-term use of high-risk medication  Z79.899     4. Need for pneumocystis prophylaxis  Z29.89     5. Encounter for medication counseling  Z71.89          Plan:     Patient Instructions     ICD-10-CM   1. ILD (interstitial lung disease) (HCC)  J84.9     2. Hypersensitivity pneumonitis (HCC)  J67.9     3. Long-term use of high-risk medication  Z79.899     4. High risk medication use  Z79.899     5. Need for pneumocystis prophylaxis  Z29.89     6. Encounter for medication counseling  Z71.89     7. Other acute sinusitis, recurrence not specified  J01.80      #Interstitial lung disease: Appears to be stable. Glad you are off fish oil  #Therapeutic drug monitoring.  Having fatigue and worsening shortness of breath/cough with the prednisone taper but also with CellCept introduction mid September 2024  #Other issue  last visit and 04/08/2023  today is acute sinusitis - recurrent  - RAST panel  negative  - CT sinus result pending  Plan - continue prednisone at 10 mg/day [do not taper below for the moment] -Continue CellCept 1500 mg twice daily -Do not escalate further - CMA /Rx team to ensure refil sent 04/08/2023  -Check CBC with diff, chemistry and liver function test and CK next 1-2 weeks  -For acute on chronic sinusitis  - await CT sinus without contrast results from 03/24/23  - START Augmenting 875 mg twice daily x 5 days   -While taking  Augmentin  hold Bactrim   -Continue oxygen 2 L nasal cannula with exertion and at night -In the future consider clinical research including registry program -Avoid sick  contacts  -JAn 2025 do spriometry and dlco  Follow-up  - 9 weeks to see Dr. Marchelle Gearing x 30 min visit but after spirometry and DLCO  -ILD symptom score  at follow-up   FOLLOWUP Return in about 9 weeks (around 06/10/2023) for 30 min visit, after Cleda Daub and DLCO, ILD, with Dr Marchelle Gearing, Face to Face Visit.  HIGh Complexity  OFFICE   2021 E/M guidelines, first released in 2021, with minor revisions added in 2023. Must meet the requirements for 2 out of 3 dimensions to qualify.    Number and complexity of problems addressed Amount and/or complexity of data reviewed Risk of complications and/or morbidity  Severe exacerbation of chronic illness  Acute or chronic illnesses that may pose a threat to life or bodily function, e.g., multiple trauma, acute MI, pulmonary embolus, severe respiratory distress, progressive rheumatoid arthritis, psychiatric illness with potential threat to self or others, peritonitis, acute renal failure, abrupt change in neurological status Must meet the requirements for  2 of 3 of the categories)  Category 1: Tests and documents, historian  Any combination of 3 of the following:  Assessment requiring an independent historian wife  Review of prior external note(s) from each unique source  Review of results of each unique test - blood  Ordering of each unique test - blood and PFT    Category 2: Interpretation of tests    Independent interpretation of a test performed by another physician/other qualified health care professional (not separately reported)  Category 3: Discuss management/tests  Discussion of management or test interpretation with external physician/other qualified health care professional/appropriate source (not separately reported)  HIGH risk of morbidity from additional diagnostic testing or treatment Examples only:  Drug therapy requiring intensive monitoring for toxicity - cellcept a  Decision for elective major surgery with  identified pateint or procedure risk factors  Decision regarding hospitalization or escalation of level of care  Decision for DNR or to de-escalate care   Parenteral controlled  substances            SIGNATURE    Dr. Kalman Shan, M.D., F.C.C.P,  Pulmonary and Critical Care Medicine Staff Physician, [facility] Center Director - Interstitial Lung Disease  Program  Pulmonary Fibrosis [facility] at [facility] [place], [place], [address]  Pager: [phone], If no answer or between  15:00h - 7:00h: call [phone] Telephone: [phone]  4:43 PM 04/08/2023

## 2023-04-08 NOTE — Telephone Encounter (Signed)
Dr.Ramaswamy please confirm correct dosage for cellcept

## 2023-04-08 NOTE — Telephone Encounter (Signed)
Ordered cellcept nfn

## 2023-04-08 NOTE — Patient Instructions (Addendum)
ICD-10-CM   1. ILD (interstitial lung disease) (HCC)  J84.9     2. Hypersensitivity pneumonitis (HCC)  J67.9     3. Long-term use of high-risk medication  Z79.899     4. High risk medication use  Z79.899     5. Need for pneumocystis prophylaxis  Z29.89     6. Encounter for medication counseling  Z71.89     7. Other acute sinusitis, recurrence not specified  J01.80      #Interstitial lung disease: Appears to be stable. Glad you are off fish oil  #Therapeutic drug monitoring.  Having fatigue and worsening shortness of breath/cough with the prednisone taper but also with CellCept introduction mid September 2024  #Other issue  last visit and 04/08/2023  today is acute sinusitis - recurrent  - RAST panel  negative  - CT sinus result pending  Plan - continue prednisone at 10 mg/day [do not taper below for the moment] -Continue CellCept 1500 mg twice daily -Do not escalate further - CMA /Rx team to ensure refil sent 04/08/2023  -Check CBC with diff, chemistry and liver function test and CK next 1-2 weeks  -For acute on chronic sinusitis  - await CT sinus without contrast results from 03/24/23  - START Augmenting 875 mg twice daily x 5 days   -While taking  Augmentin  hold Bactrim   -Continue oxygen 2 L nasal cannula with exertion and at night -In the future consider clinical research including registry program -Avoid sick contacts  -JAn 2025 do spriometry and dlco  Follow-up  - 9 weeks to see Dr. Marchelle Gearing x 30 min visit but after spirometry and DLCO  -ILD symptom score  at follow-up

## 2023-04-09 ENCOUNTER — Other Ambulatory Visit: Payer: Self-pay

## 2023-04-09 ENCOUNTER — Other Ambulatory Visit: Payer: Self-pay | Admitting: Internal Medicine

## 2023-04-09 DIAGNOSIS — Z79899 Other long term (current) drug therapy: Secondary | ICD-10-CM

## 2023-04-09 DIAGNOSIS — J849 Interstitial pulmonary disease, unspecified: Secondary | ICD-10-CM

## 2023-04-09 DIAGNOSIS — J679 Hypersensitivity pneumonitis due to unspecified organic dust: Secondary | ICD-10-CM

## 2023-04-09 NOTE — Telephone Encounter (Signed)
Refill for Cellcept request from pharmacy. Dr. Marchelle Gearing, please advise.

## 2023-04-12 NOTE — Telephone Encounter (Signed)
Pt wife calling in bc the prescription has never been sent in for the cellcept.

## 2023-04-13 ENCOUNTER — Telehealth: Payer: Self-pay | Admitting: Internal Medicine

## 2023-04-13 NOTE — Telephone Encounter (Signed)
Alexander [name] states patient needs Cellcept sent to [facility] [address]. Patient is out of medication. Robyn phone number is [phone].

## 2023-04-13 NOTE — Telephone Encounter (Signed)
ATC Robyn x1.  Left detailed message per DPR that Cellcept was sent to [facility] on 11/7, shipped on 11/8 and was delivered today in the mailbox at 11:37 am.  Advised to return call.

## 2023-04-15 NOTE — Telephone Encounter (Signed)
Spoke with the pt's spouse, [name]  She states that pt did receive med  In the future she does not want to use any pharmacy other than [facility]  Nothing further needed

## 2023-04-22 ENCOUNTER — Ambulatory Visit: Payer: Medicare Other | Attending: Cardiology

## 2023-04-22 DIAGNOSIS — I480 Paroxysmal atrial fibrillation: Secondary | ICD-10-CM

## 2023-04-22 DIAGNOSIS — I2510 Atherosclerotic heart disease of native coronary artery without angina pectoris: Secondary | ICD-10-CM

## 2023-04-22 DIAGNOSIS — Z9861 Coronary angioplasty status: Secondary | ICD-10-CM

## 2023-04-22 LAB — ECHOCARDIOGRAM COMPLETE
Area-P 1/2: 3.53 cm2
S' Lateral: 3.8 cm

## 2023-05-07 ENCOUNTER — Encounter: Payer: Self-pay | Admitting: General Practice

## 2023-05-07 ENCOUNTER — Telehealth: Payer: Medicare Other | Admitting: General Practice

## 2023-05-07 ENCOUNTER — Telehealth (INDEPENDENT_AMBULATORY_CARE_PROVIDER_SITE_OTHER): Payer: Medicare Other | Admitting: General Practice

## 2023-05-07 VITALS — BP 149/85 | HR 74 | Temp 98.1°F | Ht 70.0 in | Wt 200.0 lb

## 2023-05-07 DIAGNOSIS — J0110 Acute frontal sinusitis, unspecified: Secondary | ICD-10-CM | POA: Diagnosis not present

## 2023-05-07 MED ORDER — AMOXICILLIN-POT CLAVULANATE 875-125 MG PO TABS: 1.0000 | ORAL_TABLET | Freq: Two times a day (BID) | ORAL | 0 refills | Status: AC

## 2023-05-07 NOTE — Assessment & Plan Note (Addendum)
Symptoms and presentation suggestive of sinusitis.  No red flags on exam.   Start Augmentin antibiotics. Take 1 tablet by mouth twice daily for 7 days. Given his history of interstitial lung disease, asked patient to continue to monitor pulse ox at home.   Recommendations given for OTC medications for patient to use for symptom management.  Consider chest-ray if not better.  ER/UC precautions given.   He will schedule an in person follow up if symptoms fail to improve or worsen.

## 2023-05-07 NOTE — Patient Instructions (Addendum)
Start Augmentin antibiotics. Take 1 tablet by mouth twice daily for 7 days.  You can try a few things over the counter to help with your symptoms including:  Cough: Delsym or Robitussin (get the off brand, works just as well) Chest Congestion: Mucinex (plain) Nasal Congestion/Ear Pressure/Sinus Pressure: Try using Flonase (fluticasone) nasal spray. Instill 1 spray in each nostril twice daily. This can be purchased over the counter. Body aches, fevers, headache: Ibuprofen (not to exceed 2400 mg in 24 hours) or Acetaminophen-Tylenol (not to exceed 3000 mg in 24 hours) Runny Nose/Throat Drainage/Sneezing/Itchy or Watery Eyes: An antihistamine such as Zyrtec, Claritin, Xyzal, Allegra  Please schedule a follow up with pcp if your symptoms worsen or do not improve.   Please go to the ER/UC if you have shortness of breath, difficulty breathing or chest pain.   It was a pleasure meeting you!

## 2023-05-07 NOTE — Progress Notes (Signed)
Virtual Visit via Video Note  I connected with Alexander [name] on 05/07/23 at  2:20 PM EST by a video enabled telemedicine application and verified that I am speaking with the correct person using two identifiers.  Patient Location: Home Provider Location: Office/Clinic  I discussed the limitations, risks, security, and privacy concerns of performing an evaluation and management service by video and the availability of in person appointments. I also discussed with the patient that there may be a patient responsible charge related to this service. The patient expressed understanding and agreed to proceed.  Subjective: PCP: Eustaquio Boyden, MD  Chief Complaint  Patient presents with   Nasal Congestion    Pressure in head, hoarseness, cough (productive with light green/clear mucus), fatigue since last week right after thanksgiving. Patient has been taking dayquil and nyquil for sx.    HPI  Alexander [name] is 72 year old male, patient of Dr. Sharen Hones, presents today video visit and telephone to discuss nasal congestion.   Nasal congestion: symptom onset was on 04/30/23.  Started out with a productive cough with phlegm light green in color, postnasal drip and hoarseness.  He has frontal bilateral sinus pressure and fullness.  He has been blowing his nose a lot.  His cough is worse when laying down, which causes him to wake up.  He has a history of interstitial lung disease and is being followed by pulmonology and takes 10 mg of prednisone once daily.  He denies any fevers, chills, chest tightness, chest pain or shortness of breath.  He has been using his Flonase daily with minimal relief.  He has tried DayQuil and NyQuil with no relief.  He has not completed a COVID test at home.    ROS: Per HPI  Current Outpatient Medications:    acetaminophen (TYLENOL) 500 MG tablet, Take 1,000 mg by mouth every 6 (six) hours as needed for moderate pain or headache. , Disp: , Rfl:     amoxicillin-clavulanate (AUGMENTIN) 875-125 MG tablet, Take 1 tablet by mouth 2 (two) times daily for 7 days., Disp: 14 tablet, Rfl: 0   Ascorbic Acid (VITAMIN C) 1000 MG tablet, Take 1,000 mg by mouth daily., Disp: , Rfl:    Carboxymethylcellulose Sodium (LUBRICANT EYE DROPS OP), Place 2 drops into both eyes daily as needed (for dry eyes)., Disp: , Rfl:    Cholecalciferol (VITAMIN D3) 50 MCG (2000 UT) TABS, Take 2,000 Units by mouth daily., Disp: , Rfl:    gabapentin (NEURONTIN) 600 MG tablet, TAKE 2 TABLETS BY MOUTH 3 TIMES  DAILY, Disp: 540 tablet, Rfl: 3   ibuprofen (ADVIL) 200 MG tablet, Take 400 mg by mouth every 6 (six) hours as needed for headache or moderate pain., Disp: , Rfl:    Magnesium 250 MG TABS, Take 1 tablet (250 mg total) by mouth at bedtime. (Patient taking differently: Take 250 mg by mouth at bedtime.), Disp: , Rfl: 0   mirabegron ER (MYRBETRIQ) 25 MG TB24 tablet, Take 1 tablet (25 mg total) by mouth daily., Disp: 30 tablet, Rfl: 11   mycophenolate (CELLCEPT) 500 MG tablet, Take 3 tablets (1,500 mg total) by mouth 2 (two) times daily., Disp: 180 tablet, Rfl: 3   Omega-3 Fatty Acids (FISH OIL) 1000 MG CAPS, Take 1,000 mg by mouth daily. , Disp: , Rfl:    omeprazole (PRILOSEC) 40 MG capsule, Take 1 capsule (40 mg total) by mouth 2 (two) times daily before a meal., Disp: 180 capsule, Rfl: 4   predniSONE (DELTASONE)  10 MG tablet, Take 1 tablet (10 mg total) by mouth daily with breakfast., Disp: 30 tablet, Rfl: 2   rosuvastatin (CRESTOR) 20 MG tablet, Take 1 tablet (20 mg total) by mouth daily., Disp: 90 tablet, Rfl: 3   sildenafil (VIAGRA) 100 MG tablet, TAKE 1/2 TO 1 TABLET BY MOUTH DAILY AS NEEDED FOR ERECTILE DYSFUNCTION (Patient taking differently: Take 50-100 mg by mouth daily as needed for erectile dysfunction.), Disp: 6 tablet, Rfl: 0   sulfamethoxazole-trimethoprim (BACTRIM DS) 800-160 MG tablet, Take 1 tablet by mouth 3 (three) times a week., Disp: 36 tablet, Rfl: 1    tamsulosin (FLOMAX) 0.4 MG CAPS capsule, TAKE 1 CAPSULE BY MOUTH EVERY DAY, Disp: 90 capsule, Rfl: 3   vitamin B-12 (CYANOCOBALAMIN) 1000 MCG tablet, Take 1,000 mcg by mouth daily., Disp: , Rfl:    vitamin E 400 UNIT capsule, Take 400 Units by mouth daily., Disp: , Rfl:   Observations/Objective: Today's Vitals   05/07/23 1417  BP: (!) 149/85  Pulse: 74  Temp: 98.1 F (36.7 C)  TempSrc: Temporal  Weight: 200 lb (90.7 kg)  Height: 5\' 10"  (1.778 m)   Physical Exam Vitals and nursing note reviewed.   General: Alert and oriented x 3, no distress, does not appear sickly  Pulmonary: Speaks in complete sentences without increased work of breathing, no cough during visit.  Psychiatric: Normal mood, thought content, and behavior.  Assessment and Plan: Acute non-recurrent frontal sinusitis Assessment & Plan: Symptoms and presentation suggestive of sinusitis.  No red flags on exam.   Start Augmentin antibiotics. Take 1 tablet by mouth twice daily for 7 days. Given his history of interstitial lung disease, asked patient to continue to monitor pulse ox at home.   Recommendations given for OTC medications for patient to use for symptom management.  Consider chest-ray if not better.  ER/UC precautions given.   He will schedule an in person follow up if symptoms fail to improve or worsen.   Orders: -     Amoxicillin-Pot Clavulanate; Take 1 tablet by mouth 2 (two) times daily for 7 days.  Dispense: 14 tablet; Refill: 0    Follow Up Instructions: Return if symptoms worsen or fail to improve.   I discussed the assessment and treatment plan with the patient. The patient was provided an opportunity to ask questions, and all were answered. The patient agreed with the plan and demonstrated an understanding of the instructions.   The patient was advised to call back or seek an in-person evaluation if the symptoms worsen or if the condition fails to improve as anticipated.  The above  assessment and management plan was discussed with the patient. The patient verbalized understanding of and has agreed to the management plan.   Modesto Charon, NP

## 2023-06-16 ENCOUNTER — Other Ambulatory Visit: Payer: Self-pay | Admitting: Internal Medicine

## 2023-06-16 DIAGNOSIS — J679 Hypersensitivity pneumonitis due to unspecified organic dust: Secondary | ICD-10-CM

## 2023-06-16 DIAGNOSIS — J849 Interstitial pulmonary disease, unspecified: Secondary | ICD-10-CM

## 2023-06-18 NOTE — Telephone Encounter (Signed)
Patient significantly overdue for labs. Spoke with patient. He will stop by next week for labs.  He does want refill sent to [facility] (confirmed today)  Chesley Mires, PharmD, MPH, BCPS, CPP Clinical Pharmacist (Rheumatology and Pulmonology)

## 2023-06-24 ENCOUNTER — Other Ambulatory Visit: Payer: Medicare Other

## 2023-06-24 DIAGNOSIS — Z79899 Other long term (current) drug therapy: Secondary | ICD-10-CM

## 2023-06-24 LAB — HEPATIC FUNCTION PANEL
ALT: 15 U/L (ref 0–53)
AST: 17 U/L (ref 0–37)
Albumin: 4.4 g/dL (ref 3.5–5.2)
Alkaline Phosphatase: 58 U/L (ref 39–117)
Bilirubin, Direct: 0.2 mg/dL (ref 0.0–0.3)
Total Bilirubin: 0.6 mg/dL (ref 0.2–1.2)
Total Protein: 7.4 g/dL (ref 6.0–8.3)

## 2023-06-24 LAB — CK: Total CK: 366 U/L — ABNORMAL HIGH (ref 7–232)

## 2023-06-24 LAB — BASIC METABOLIC PANEL
BUN: 15 mg/dL (ref 6–23)
CO2: 21 meq/L (ref 19–32)
Calcium: 9 mg/dL (ref 8.4–10.5)
Chloride: 105 meq/L (ref 96–112)
Creatinine, Ser: 0.54 mg/dL (ref 0.40–1.50)
GFR: 99 mL/min (ref >60.00)
Glucose, Bld: 117 mg/dL — ABNORMAL HIGH (ref 70–99)
Potassium: 3.7 meq/L (ref 3.5–5.1)
Sodium: 137 meq/L (ref 135–145)

## 2023-06-24 LAB — CBC WITH DIFFERENTIAL/PLATELET
Basophils Absolute: 0 10*3/uL (ref 0.0–0.1)
Basophils Relative: 0.4 % (ref 0.0–3.0)
Eosinophils Absolute: 0.4 10*3/uL (ref 0.0–0.7)
Eosinophils Relative: 4.7 % (ref 0.0–5.0)
HCT: 44.4 % (ref 39.0–52.0)
Hemoglobin: 15 g/dL (ref 13.0–17.0)
Lymphocytes Relative: 24.5 % (ref 12.0–46.0)
Lymphs Abs: 2.3 10*3/uL (ref 0.7–4.0)
MCHC: 33.9 g/dL (ref 30.0–36.0)
MCV: 99.7 fL (ref 78.0–100.0)
Monocytes Absolute: 0.7 10*3/uL (ref 0.1–1.0)
Monocytes Relative: 7.1 % (ref 3.0–12.0)
Neutro Abs: 6 10*3/uL (ref 1.4–7.7)
Neutrophils Relative %: 63.3 % (ref 43.0–77.0)
Platelets: 257 10*3/uL (ref 150.0–400.0)
RBC: 4.45 Mil/uL (ref 4.22–5.81)
RDW: 12.9 % (ref 11.5–15.5)
WBC: 9.5 10*3/uL (ref 4.0–10.5)

## 2023-06-25 ENCOUNTER — Encounter: Payer: Self-pay | Admitting: Pharmacist

## 2023-06-25 NOTE — Telephone Encounter (Signed)
Rx for Cellcept 1500mg  twice daily sent to [facility]. CBC, renal and liver function normal

## 2023-06-28 ENCOUNTER — Other Ambulatory Visit: Payer: Self-pay | Admitting: Family Medicine

## 2023-06-29 NOTE — Telephone Encounter (Signed)
Refill request for gabapentin (NEURONTIN) 600 MG tablet   LOV - 11/11/22 Next OV - 11/17/23 Last refill - 11/12/22 #540/3

## 2023-07-01 ENCOUNTER — Encounter: Payer: Self-pay | Admitting: Internal Medicine

## 2023-07-01 DIAGNOSIS — J849 Interstitial pulmonary disease, unspecified: Secondary | ICD-10-CM

## 2023-07-01 DIAGNOSIS — J679 Hypersensitivity pneumonitis due to unspecified organic dust: Secondary | ICD-10-CM

## 2023-07-01 DIAGNOSIS — Z79899 Other long term (current) drug therapy: Secondary | ICD-10-CM

## 2023-07-01 DIAGNOSIS — J018 Other acute sinusitis: Secondary | ICD-10-CM

## 2023-07-01 MED ORDER — PREDNISONE 10 MG PO TABS: 40.0000 mg | ORAL_TABLET | Freq: Every day | ORAL | 0 refills | Status: AC

## 2023-07-01 MED ORDER — DOXYCYCLINE HYCLATE 100 MG PO TABS: 100.0000 mg | ORAL_TABLET | Freq: Two times a day (BID) | ORAL | 0 refills | Status: DC

## 2023-07-01 NOTE — Telephone Encounter (Signed)
Pt sent the following mychart msg:  Alexander [name]  P [facility] (supporting Kalman Shan, MD)7 hours ago (9:17 AM)    I was wondering if you could prescribe the antibiotics (I think it was called doxycycline)you gave me last time for my congestion. The congestion is in my sinuses runs down my throat causing me to cough uncontrollably at times. Thank you.   I called and spoke with him  He c/o increased cough with yellow sputum, yellow nasal  d/c, wheezing, PND and increased SOB x 2 days He has yellow d/c from his nose  Denies any fevers, aches  He states still taking cellcept and bactrim 3 x/wk Has been off of pred for at least a month- says he thought this was the plan  He asks for abx  Please advise, thanks!  No Known Allergies

## 2023-07-01 NOTE — Telephone Encounter (Signed)
Okay last time I gave him Augmentin but doxycycline is a reasonable alternative.  Prescribing this for 7 days. Take doxycycline 100mg  po twice daily x 7 days; take after meals and avoid sunlight  He should also consider taking prednisone at a higher dose for at least 5 days and I will send that to prednisone 40 mg daily for 5 days and then go back to baseline 10 mg/day  I am supposed to see him in January 2025 with pulmonary function testing and a 30-minute visit but I see nothing on the schedule.  Please make sure he is going to do that first available at least.  No Known Allergies        Latest Ref Rng & Units 04/02/2023   10:40 AM 08/21/2022   10:42 AM 08/01/2015    2:22 PM  PFT Results  FVC-Pre L 2.41  2.42  5.42  P  FVC-Predicted Pre % 55  55  113  P  FVC-Post L  2.12  5.35  P  FVC-Predicted Post %  48  112  P  Pre FEV1/FVC % % 86  84  85  P  Post FEV1/FCV % %  90  87  P  FEV1-Pre L 2.07  2.04  4.58  P  FEV1-Predicted Pre % 65  64  129  P  FEV1-Post L  1.92  4.65  P  DLCO uncorrected ml/min/mmHg 9.61  17.86  20.37  P  DLCO UNC% % 37  69  60  P  DLCO corrected ml/min/mmHg 9.50  17.86    DLCO COR %Predicted % 37  69    DLVA Predicted % 68  93  57  P  TLC L  4.08  7.55  P  TLC % Predicted %  58  104  P  RV % Predicted %  59  82  P    P Preliminary result      Current Outpatient Medications:    acetaminophen (TYLENOL) 500 MG tablet, Take 1,000 mg by mouth every 6 (six) hours as needed for moderate pain or headache. , Disp: , Rfl:    Ascorbic Acid (VITAMIN C) 1000 MG tablet, Take 1,000 mg by mouth daily., Disp: , Rfl:    Carboxymethylcellulose Sodium (LUBRICANT EYE DROPS OP), Place 2 drops into both eyes daily as needed (for dry eyes)., Disp: , Rfl:    Cholecalciferol (VITAMIN D3) 50 MCG (2000 UT) TABS, Take 2,000 Units by mouth daily., Disp: , Rfl:    gabapentin (NEURONTIN) 600 MG tablet, TAKE 2 TABLETS BY MOUTH 3 TIMES  DAILY, Disp: 600 tablet, Rfl: 3   ibuprofen (ADVIL)  200 MG tablet, Take 400 mg by mouth every 6 (six) hours as needed for headache or moderate pain., Disp: , Rfl:    Magnesium 250 MG TABS, Take 1 tablet (250 mg total) by mouth at bedtime. (Patient taking differently: Take 250 mg by mouth at bedtime.), Disp: , Rfl: 0   mirabegron ER (MYRBETRIQ) 25 MG TB24 tablet, Take 1 tablet (25 mg total) by mouth daily., Disp: 30 tablet, Rfl: 11   mycophenolate (CELLCEPT) 500 MG tablet, TAKE 3 TABLETS BY MOUTH TWICE  DAILY, Disp: 540 tablet, Rfl: 1   Omega-3 Fatty Acids (FISH OIL) 1000 MG CAPS, Take 1,000 mg by mouth daily. , Disp: , Rfl:    omeprazole (PRILOSEC) 40 MG capsule, Take 1 capsule (40 mg total) by mouth 2 (two) times daily before a meal., Disp: 180 capsule, Rfl: 4   predniSONE (  DELTASONE) 10 MG tablet, Take 1 tablet (10 mg total) by mouth daily with breakfast., Disp: 30 tablet, Rfl: 2   rosuvastatin (CRESTOR) 20 MG tablet, Take 1 tablet (20 mg total) by mouth daily., Disp: 90 tablet, Rfl: 3   sildenafil (VIAGRA) 100 MG tablet, TAKE 1/2 TO 1 TABLET BY MOUTH DAILY AS NEEDED FOR ERECTILE DYSFUNCTION (Patient taking differently: Take 50-100 mg by mouth daily as needed for erectile dysfunction.), Disp: 6 tablet, Rfl: 0   sulfamethoxazole-trimethoprim (BACTRIM DS) 800-160 MG tablet, Take 1 tablet by mouth 3 (three) times a week., Disp: 36 tablet, Rfl: 1   tamsulosin (FLOMAX) 0.4 MG CAPS capsule, TAKE 1 CAPSULE BY MOUTH EVERY DAY, Disp: 90 capsule, Rfl: 3   vitamin B-12 (CYANOCOBALAMIN) 1000 MCG tablet, Take 1,000 mcg by mouth daily., Disp: , Rfl:    vitamin E 400 UNIT capsule, Take 400 Units by mouth daily., Disp: , Rfl:

## 2023-07-01 NOTE — Telephone Encounter (Signed)
ERx

## 2023-07-27 ENCOUNTER — Other Ambulatory Visit: Payer: Self-pay

## 2023-07-27 DIAGNOSIS — N3941 Urge incontinence: Secondary | ICD-10-CM

## 2023-07-27 MED ORDER — MIRABEGRON ER 25 MG PO TB24: 25.0000 mg | ORAL_TABLET | Freq: Every day | ORAL | 1 refills | Status: DC

## 2023-07-27 NOTE — Telephone Encounter (Signed)
Received faxed refill request.  E-scribed refill.  

## 2023-08-19 ENCOUNTER — Telehealth: Payer: Self-pay

## 2023-08-19 ENCOUNTER — Encounter: Payer: Self-pay | Admitting: Internal Medicine

## 2023-08-19 ENCOUNTER — Telehealth: Payer: Self-pay | Admitting: Internal Medicine

## 2023-08-19 ENCOUNTER — Ambulatory Visit: Payer: Medicare Other | Admitting: Internal Medicine

## 2023-08-19 ENCOUNTER — Telehealth: Payer: Self-pay | Admitting: Pharmacist

## 2023-08-19 VITALS — BP 127/77 | HR 100 | Temp 97.8°F | Ht 70.0 in | Wt 218.6 lb

## 2023-08-19 DIAGNOSIS — J679 Hypersensitivity pneumonitis due to unspecified organic dust: Secondary | ICD-10-CM

## 2023-08-19 DIAGNOSIS — J849 Interstitial pulmonary disease, unspecified: Secondary | ICD-10-CM

## 2023-08-19 DIAGNOSIS — Z2989 Encounter for other specified prophylactic measures: Secondary | ICD-10-CM | POA: Diagnosis not present

## 2023-08-19 DIAGNOSIS — R053 Chronic cough: Secondary | ICD-10-CM

## 2023-08-19 DIAGNOSIS — R0902 Hypoxemia: Secondary | ICD-10-CM

## 2023-08-19 DIAGNOSIS — Z79899 Other long term (current) drug therapy: Secondary | ICD-10-CM | POA: Diagnosis not present

## 2023-08-19 DIAGNOSIS — R0609 Other forms of dyspnea: Secondary | ICD-10-CM

## 2023-08-19 MED ORDER — PREDNISONE 10 MG PO TABS: 10.0000 mg | ORAL_TABLET | Freq: Every day | ORAL | 1 refills | Status: DC

## 2023-08-19 MED ORDER — MYCOPHENOLATE MOFETIL 500 MG PO TABS: 1500.0000 mg | ORAL_TABLET | Freq: Two times a day (BID) | ORAL | 1 refills | Status: DC

## 2023-08-19 MED ORDER — SULFAMETHOXAZOLE-TRIMETHOPRIM 800-160 MG PO TABS
1.0000 | ORAL_TABLET | ORAL | 1 refills | Status: DC
Start: 2023-08-20 — End: 2023-10-06

## 2023-08-19 NOTE — Telephone Encounter (Signed)
 Submitted a Prior Authorization request to [facility] for OFEV via CoverMyMeds. Will update once we receive a response.  Key: [id number]   Chesley Mires, PharmD, MPH, BCPS, CPP Clinical Pharmacist (Rheumatology and Pulmonology)

## 2023-08-19 NOTE — Telephone Encounter (Signed)
 RX teaam re Caprice Kluver - OFEV START PLEASE  Thanks    SIGNATURE    Dr. Kalman Shan, M.D., F.C.C.P,  Pulmonary and Critical Care Medicine Staff Physician, [facility] Director - Interstitial Lung Disease  Program  Pulmonary Fibrosis [facility] [place], [place], [address]   Pager: [phone], If no answer  -> Check AMION or Try [phone] Telephone (clinical office): [phone] Telephone (research): [phone]  1:42 PM 08/19/2023

## 2023-08-19 NOTE — Progress Notes (Signed)
 71yM with history of CAD, HTN, ICM, OSA not on CPAP, pAF, very light smoking in past. Had covid-19 a year ago in January was not hospitalized with it.   Coughing, short of breath, little energy over last 4 months. Cough is dry occasionally productive of sputum but he just swallows it. He does have sinus congestion and postnasal drainage. He tried flonase on and off since November. Benadryl here and there. He is on omeprazole BID for reflux. No solid/liquid food dysphagia.   Got steroid course in October/Nov, steroids did not help. Had another round of steroids/antibiotic which did not help.   Had polio as a kid and started to need braces/crutches in 2002.   Some morning stiffness but takes 10-15 min to loosen up, no rashes, raynaud's phenomenon, ulcers in mouth or nose.   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 Some exposure to cleaning agents in past, some solvents. No MJ, vaping. Born in [place], lived for 40y in [place] and then was in [place] in [place], moved to [place]. 3 dogs. No hot tub.   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 Thora Lance  Time of Conference: 7.00am- 8.00am Date of conference: 09/08/2022 Location of Conference: -  Virtual  Participating Pulmonary: Dr. Kalman Shan, Dr Chilton Greathouse, Dr. Felisa Bonier Pathology: Dr Holley Bouche Radiology: Dr Lauralyn Primes  Others:   Brief History: 73 year old was on amio for 6 months. Has post polio syndrome. Has severe GERD . [place] Does a LOT of GARDENING. When lived in [place] it was damp. Serology negative. 5-6 months of symptoms. HAd mild covid a  year ago. Does this seem to be consistent with chronic hp from dust exposure enough to treat empirically?   Should I alternatively send for surgical lung biopsy or instead consider bronchoscopy/BAL for cell count/diff first?   MDD discussion of CT scan    - Date or time period of scan: HRCT: 07/24/2022   - Discussion synopsis:  clearest case of alternative diagnosis. Has quite a bit of traction bronchiectasis.  There is lot of air trapping . Upper lobe predminant.   - What is the final conclusion per 2018 ATS/Fleischner Criteria - Leading differential is Chronic HP  - Concordance with official report: CONCORDANT  Pathology discussion of biopsy: No lung biopsy    MDD Impression/Recs: Suspect chronic HP ., REC a) send HP panel; b) ILD questionnaire; c) shared decision making for bronch BAL/Ttbx for histopath and envisia  OV May 2024   Interval HPI:  ILD conference recommends shared decision-making discussion wrt bronch/BAL/TBLB  PSG with moderate OSA AHI of 20 and SpO2 low of 75%  Does wear mask when he heads out to do yardwork.  Overall feels more energetic than at last visit.   Otherwise pertinent review of  systems is negative.  xxxxxxxxxxxxxxxxxxxxxxxxxxxxxxxx[id number] Interval HPI: June 2024  Underwent bronch/BAL/tblb 5/30. BAL diff with 18% lymphs, 11% eos.  NEGATIVE ENVISIA. Non-diagnostic TTBX for Histo pat  Gabapentin, ibuprofen with case report of eosinophilic pna but not this pattern Rosuvastatin with ILD but not this pattern  Overall still with a lack of energy. No change in DOE, cough. Has not yet set up appointment to get  autoPAP, playing phone tag with [employer].   Otherwise pertinent review of systems is negative.  - Start prednisone 30 mg daily - see instructions for taper  - Spirometry next visit - see you in 2-3 months or sooner if need be!  OV 01/28/2023 -transfer of care from Dr. Felisa Bonier to Dr. Marchelle Gearing in the ILD center.  Subjective:  Patient ID: Alexander [name], male , DOB: [birth year] , age 40 y.o. , MRN: [id number] , ADDRESS: [address] [place] [address] PCP Eustaquio Boyden, MD Patient Care Team: Eustaquio Boyden, MD as PCP - General (Family Medicine) Marykay Lex, MD as PCP - Cardiology (Cardiology) Marykay Lex, MD as Consulting Physician (Cardiology) Merwyn Katos, MD (Inactive) as Consulting Physician (Pulmonary Disease)  This Provider for this visit: Treatment Team:  Attending Provider: Kalman Shan, MD    01/28/2023 -   Chief Complaint  Patient presents with   Consult    Former pt of Dr. Thora Lance.  Discuss labs and cxr/scan     HPI Alexander [name] 73 y.o. -presents for follow-up.  History is provided by reviewing of the prior records and also talking to his wife and talking to him   #background: He grew up in [place].  At age 61 he suffered from infantile polio.  After that he recovered he just walked with a limp.  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  He also then worked for [employer]  during the same time.[employer] [employer]  He is to smoke occasionally during this time.  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  No visible mold.  He then retired and then moved to [place] because his wife is originally from [place] and his sister-in-law lives in this area.  In the last few years his polio has flared up and is now using crutches.  #ILD reviewed the CT scans of the chest suggest  that he had ILD even in 2016 and even in  and 2022.  In early 2023 he suffered from COVID-19.  Then by October 2023 started having a cough and then established with Dr. Felisa Bonier in early 2024.  In my personal visualization the ILD has progressed.  In conference they have raised the specter of hypersensitive pneumonitis because of air trapping and the features.  I personally agree with this.  His main symptoms was cough followed by shortness of breath.  During this time he had a bronchoscopy with lavage is mixed cellularity.  Nondiagnostic on RNA genomic classifier and histopathology transbronchial biopsies.  However this does not preclude chronic HP.  His serologies are essentially normal.  By mid June 2024 after the ILD conference started on prednisone 30 mg/day.  He slowly taper this and currently on 10 mg.  The cough resolved but according to his wife is coming back now at the lower dose of prednisone.  Uses oxygen  for the last 1 month.  Today room air at rest was 94%.       OV 03/18/2023  Subjective:  Patient ID: Alexander [name], male , DOB: 10-24-1950 , age 37 y.o. , MRN: [id number] , ADDRESS: [address] [place] [address] PCP Eustaquio Boyden, MD Patient Care Team: Eustaquio Boyden, MD as PCP - General (Family Medicine) Marykay Lex, MD as PCP - Cardiology (Cardiology) Marykay Lex, MD as Consulting Physician (Cardiology) Merwyn Katos, MD (Inactive) as Consulting Physician (Pulmonary Disease)  This Provider for this visit: Treatment Team:  Attending Provider: Kalman Shan, MD    03/18/2023 -   Chief Complaint  Patient presents with   Follow-up    ILD- Doing ok SOB is getting worse and his cough is worse now that he is done with prednisone. UTD on flu, covid, pneumo and rsv vaccines. Wants to go over labs. Lots of mucous.  Before sit stand O2 on room air 94 pulse of 101. After sit stand O2 was 82 and pulse was 129.   HPI Alexander [name]  73 y.o. -returns for follow-up.    #Interstitial lung disease and chronic hypersensitive pneumonitis associate with high risk prescription and encounter for therapeutic monitoring  -Oxygen requirement is the same.  Symptom scores around stable but he does tell me that and his wife also affirms that since going up on the CellCept 1000 mg twice daily he is a little more fatigued.  This also corresponds with him going down on the prednisone to 10 mg/day.  Took a shared decision making for him to hold at this dose without having to either taper the prednisone further or escalate the CellCept to 1500 mg twice daily.  He is okay with this.  Today we will get safety labs.  He continues on his 2 L oxygen with exercise and at night.  Unclear if he stopped his fish oil  #New complaint: He is a lot of grandkids around him and he feels because of this exposure he is having chronic sinus complaint.  For the last few days it is also worse with greenish drainage.  He is already on Bactrim for PCP prophylaxis.   OV 08/19/2023  Subjective:  Patient ID: Alexander [name], male , DOB: June 27, 1950 , age 24 y.o. , MRN: [id number] , ADDRESS: [address] [place] [address] PCP Eustaquio Boyden, MD Patient Care Team: Eustaquio Boyden, MD as PCP - General (Family Medicine) Marykay Lex, MD as PCP - Cardiology (Cardiology) Marykay Lex, MD as Consulting Physician (Cardiology) Merwyn Katos, MD (Inactive) as Consulting Physician (Pulmonary Disease)  This Provider for this visit: Treatment Team:  Attending Provider: Kalman Shan, MD   Interstitial lung disease chronic hypersensitive pneumonitis  -Etiology mold exposure while working in [employer] [facility] many years ago  -Diagnosis given end of August 2024  -Progressive phenotype with COVID in 2022 stirring the pot   -   on prednisone since mid June 2024   -Started CellCept and prophylactic Bactrim mid September 2024  Sleep apnea: CPAP  compliance report July 2024 through mid October 2024: 77% compliance for greater than 4 hours and 19% compliance for less than 4 hours a total of 96% compliance.  Severe chronic cough   - exertional  -  - RAST panel  negative  - CT sinus result pending  #Braces for walking-history of polio.  #High risk medication with CellCept  -Immunosuppressant with multiple toxicities including opportunistic  infections that require intensive therapeutic monitoring  #High risk medication with chronic prednisone     08/19/2023 -   Chief Complaint  Patient presents with   Follow-up     HPI Alexander [name] 73 y.o. -presents with his wife.  It appears that overall he is stable.  It is dyspnea score shows he is stable.  However when he did exercise hypoxemia test he seemed to desaturate faster than back in October 2024.  Back in October 2024 for sinus complaints work the biggest issue but CT scan sinus was noncontributory.  He also improved to Augmentin.  In January 2025 he did have influenza treated as an outpatient.  He feels stable but his biggest complaint is his quality of life.  Minimal exertion makes him desaturate and minimal exertion makes him cough significantly.  He does not always use his oxygen with exertion because of his polio braces.  But he feels miserable.  He is asking for stronger medications.  He is now completed for few months on CellCept overall he is tolerating it well.  He needs a safety check today.     We discussed nintedanib - Nintedanib/Ofev requires intensive drug monitoring due to high concerns for Adverse effects of , including  Drug Induced Liver Injury, significant GI side effects that include but not limited to Diarrhea, Nausea, Vomiting,  and other system side effects that include Fatigue,  weight loss. Cardiac side effects are a black box warning as well. These will be monitored with  blood work such as LFT initially once a month for 6 months and then quarterly  - He  agreed to start     SYMPTOM SCALE - ILD 01/28/2023 03/18/2023  08/19/2023   Current weight Ra 94%    O2 use 2L  Not really good about wearing his portable oxygen with exertion  Shortness of Breath 0 -> 5 scale with 5 being worst (score 6 If unable to do)    At rest 3 1 2   Simple tasks - showers, clothes change, eating, shaving 3 3 3   Household (dishes, doing bed, laundry) 3 3 3   Shopping 4 1 0  Walking level at own pace 5 5 3   Walking up Stairs 5 5 5   Total (30-36) Dyspnea Score 23 18 16   How bad is your cough? 3 4 4   How bad is your fatigue 3 4 4   How bad is nausea 0 0 0  How bad is vomiting?  0 0 0  How bad is diarrhea? 0 1 0  How bad is anxiety? 0 0 3  How bad is depression 0 0 1  Any chronic pain - if so where and how bad 0  0    Simple office walk 224 (66+46 x 2) feet Pod A at [facility] x  3 laps goal with forehead probe 03/18/2023  08/19/2023   O2 used ra ra  Number laps completed Sit stand x 15 Sit stand times 10  Comments about pace Nrmal pace   Resting Pulse Ox/HR 94% and 102/min 100% with heart rate of 103  Final Pulse Ox/HR 82% and 129/min 85% with a heart rate of 142  Desaturated </= 88% yes Yes  Desaturated <= 3% points yes Yes yes  Got Tachycardic >/= 90/min yes Yes  Symptoms at end of test Dyspnea  Very dyspneic and labored  Miscellaneous comments x ?  Worse       PFT     Latest Ref Rng &  Units 04/02/2023   10:40 AM 08/21/2022   10:42 AM 08/01/2015    2:22 PM  PFT Results  FVC-Pre L 2.41  2.42  5.42  P  FVC-Predicted Pre % 55  55  113  P  FVC-Post L  2.12  5.35  P  FVC-Predicted Post %  48  112  P  Pre FEV1/FVC % % 86  84  85  P  Post FEV1/FCV % %  90  87  P  FEV1-Pre L 2.07  2.04  4.58  P  FEV1-Predicted Pre % 65  64  129  P  FEV1-Post L  1.92  4.65  P  DLCO uncorrected ml/min/mmHg 9.61  17.86  20.37  P  DLCO UNC% % 37  69  60  P  DLCO corrected ml/min/mmHg 9.50  17.86    DLCO COR %Predicted % 37  69    DLVA Predicted % 68  93  57  P  TLC L   4.08  7.55  P  TLC % Predicted %  58  104  P  RV % Predicted %  59  82  P    P Preliminary result       LAB RESULTS last 96 hours No results found.       has a past medical history of Acute cholecystitis (03/2016), Arthritis, CAD S/P percutaneous coronary angioplasty (1995, 2000, 2005), Calculus of bile duct with acute cholangitis with obstruction, Chronic pain syndrome, Complication of anesthesia (04/01/2021), Duodenal stricture, Essential hypertension, Fatty liver, Gallstones, GERD (gastroesophageal reflux disease), History of depression, History of post poliomyelitis muscular atrophy, HLD (hyperlipidemia), Ischemic cardiomyopathy, Migraines, Muscle weakness following poliomyelitis, Neuromuscular disorder (HCC), Neuropathy, OSA (obstructive sleep apnea), PAF (paroxysmal atrial fibrillation) (HCC), Pneumonia (04/2015), Septic shock (HCC) (01/07/2018), and Shoulder pain, right.   reports that he quit smoking about 53 years ago. His smoking use included cigarettes. He has never used smokeless tobacco.  Past Surgical History:  Procedure Laterality Date   BALLOON DILATION N/A 06/06/2018   Procedure: BALLOON DILATION;  Surgeon: Mansouraty, Netty Starring., MD;  Location: [facility] ENDOSCOPY;  Service: Gastroenterology;  Laterality: N/A;   BALLOON DILATION N/A 07/06/2018   Procedure: BALLOON DILATION;  Surgeon: Meridee Score Netty Starring., MD;  Location: [facility] ENDOSCOPY;  Service: Gastroenterology;  Laterality: N/A;   BALLOON DILATION N/A 07/24/2021   Procedure: BALLOON DILATION;  Surgeon: Meridee Score Netty Starring., MD;  Location: [facility] ENDOSCOPY;  Service: Gastroenterology;  Laterality: N/A;   BILIARY BRUSHING  06/05/2021   Procedure: BILIARY BRUSHING;  Surgeon: Meridee Score Netty Starring., MD;  Location: [facility] ENDOSCOPY;  Service: Gastroenterology;;   BILIARY DILATION  06/05/2021   Procedure: BILIARY DILATION;  Surgeon: Lemar Lofty., MD;  Location: [facility] ENDOSCOPY;  Service: Gastroenterology;;   BILIARY  DILATION  09/15/2021   Procedure: BILIARY DILATION;  Surgeon: Lemar Lofty., MD;  Location: [facility] ENDOSCOPY;  Service: Gastroenterology;;   BILIARY STENT PLACEMENT  01/08/2018   Procedure: BILIARY STENT PLACEMENT;  Surgeon: Lynann Bologna, MD;  Location: [facility] ENDOSCOPY;  Service: Endoscopy;;   BILIARY STENT PLACEMENT N/A 02/09/2018   Procedure: BILIARY STENT PLACEMENT;  Surgeon: Lemar Lofty., MD;  Location: [facility] ENDOSCOPY;  Service: Gastroenterology;  Laterality: N/A;   BILIARY STENT PLACEMENT  04/01/2021   Procedure: BILIARY STENT PLACEMENT;  Surgeon: Lynann Bologna, MD;  Location: [facility] ENDOSCOPY;  Service: Endoscopy;;   BIOPSY  06/06/2018   Procedure: BIOPSY;  Surgeon: Lemar Lofty., MD;  Location: [facility] ENDOSCOPY;  Service: Gastroenterology;;   BIOPSY  04/01/2021  Procedure: BIOPSY;  Surgeon: Lynann Bologna, MD;  Location: [facility] ENDOSCOPY;  Service: Endoscopy;;   BIOPSY  06/05/2021   Procedure: BIOPSY;  Surgeon: Lemar Lofty., MD;  Location: [facility] ENDOSCOPY;  Service: Gastroenterology;;   BRONCHIAL BIOPSY  10/29/2022   Procedure: BRONCHIAL BIOPSIES;  Surgeon: [name], MD;  Location: [facility] ENDOSCOPY;  Service: Pulmonary;;   BRONCHIAL WASHINGS  10/29/2022   Procedure: BRONCHIAL WASHINGS;  Surgeon: [name], MD;  Location: [facility] ENDOSCOPY;  Service: Pulmonary;;   BRONCHOSCOPY  09/2022   benign biopsies, negative for eosinophils, granuloma, fungal (Meier)   CARDIAC CATHETERIZATION N/A 04/30/2015   Procedure: Left Heart Cath and Coronary Angiography;  Surgeon: Lennette Bihari, MD;  Location: [facility] INVASIVE CV LAB;  Service: Cardiovascular; LM nl, ost/p LAD 20%, patent mLAD stent, RI small, OM2 patent stent, pRCA 20%, patent stent, EF 45-50%-->Med Rx    cervical spine stimulator  08/2012   CHOLECYSTECTOMY  06/10/2015   Procedure: LAPAROSCOPIC CHOLECYSTECTOMY;  Surgeon: De Blanch Kinsinger, MD;  Location: [facility] OR;  Service: General;;   COLONOSCOPY  09/2013   TA x2,  rpt 5 yrs (Pyrtle)   COLONOSCOPY WITH ESOPHAGOGASTRODUODENOSCOPY (EGD)  06/2018   reactive gastropathy, tubular adenoma, rpt colonoscopy 5 yrs (Mansouraty)   COLONOSCOPY WITH PROPOFOL N/A 06/06/2018   Procedure: COLONOSCOPY WITH PROPOFOL;  Surgeon: Lemar Lofty., MD;  Location: [facility] ENDOSCOPY;  Service: Gastroenterology;  Laterality: N/A;   CORONARY ANGIOPLASTY WITH STENT PLACEMENT  1995, 2000, 2005   stents in RCA, LAD & Cx-OM. -Stents were patent by cardiac catheterization in November 2016   ENDOSCOPIC RETROGRADE CHOLANGIOPANCREATOGRAPHY (ERCP) WITH PROPOFOL N/A 02/09/2018   Procedure: ENDOSCOPIC RETROGRADE CHOLANGIOPANCREATOGRAPHY (ERCP) WITH PROPOFOL;  Surgeon: Lemar Lofty., MD;  Location: [facility] ENDOSCOPY;  Service: Gastroenterology;  Laterality: N/A;   ENDOSCOPIC RETROGRADE CHOLANGIOPANCREATOGRAPHY (ERCP) WITH PROPOFOL N/A 03/30/2018   Procedure: ENDOSCOPIC RETROGRADE CHOLANGIOPANCREATOGRAPHY (ERCP) WITH PROPOFOL;  Surgeon: Meridee Score Netty Starring., MD;  Location: [facility] ENDOSCOPY;  Service: Gastroenterology;  Laterality: N/A;   ENDOSCOPIC RETROGRADE CHOLANGIOPANCREATOGRAPHY (ERCP) WITH PROPOFOL N/A 06/05/2021   Procedure: ENDOSCOPIC RETROGRADE CHOLANGIOPANCREATOGRAPHY (ERCP) WITH PROPOFOL;  Surgeon: Meridee Score Netty Starring., MD;  Location: [facility] ENDOSCOPY;  Service: Gastroenterology;  Laterality: N/A;   ENDOSCOPIC RETROGRADE CHOLANGIOPANCREATOGRAPHY (ERCP) WITH PROPOFOL N/A 09/15/2021   Procedure: ENDOSCOPIC RETROGRADE CHOLANGIOPANCREATOGRAPHY (ERCP) WITH PROPOFOL;  Surgeon: Meridee Score Netty Starring., MD;  Location: [facility] ENDOSCOPY;  Service: Gastroenterology;  Laterality: N/A;   ERCP N/A 01/08/2018   Procedure: ENDOSCOPIC RETROGRADE CHOLANGIOPANCREATOGRAPHY (ERCP);  Surgeon: Lynann Bologna, MD;  Location: [facility] ENDOSCOPY;  Service: Endoscopy;  Laterality: N/A;   ERCP N/A 04/01/2021   Procedure: ENDOSCOPIC RETROGRADE CHOLANGIOPANCREATOGRAPHY (ERCP);  Surgeon: Lynann Bologna, MD;  Location: [facility]  ENDOSCOPY;  Service: Endoscopy;  Laterality: N/A;  Please schedule after 1 PM   ESOPHAGOGASTRODUODENOSCOPY N/A 03/30/2018   Procedure: ESOPHAGOGASTRODUODENOSCOPY (EGD);  Surgeon: Lemar Lofty., MD;  Location: [facility] ENDOSCOPY;  Service: Gastroenterology;  Laterality: N/A;   ESOPHAGOGASTRODUODENOSCOPY (EGD) WITH PROPOFOL N/A 06/06/2018   Procedure: ESOPHAGOGASTRODUODENOSCOPY (EGD) WITH PROPOFOL;  Surgeon: Meridee Score Netty Starring., MD;  Location: [facility] [place] [facility] ENDOSCOPY;  Service: Gastroenterology;  Laterality: N/A;   ESOPHAGOGASTRODUODENOSCOPY (EGD) WITH PROPOFOL N/A 07/06/2018   Procedure: ESOPHAGOGASTRODUODENOSCOPY (EGD) WITH PROPOFOL;  Surgeon: Meridee Score Netty Starring., MD;  Location: [facility] ENDOSCOPY;  Service: Gastroenterology;  Laterality: N/A;   ESOPHAGOGASTRODUODENOSCOPY (EGD) WITH PROPOFOL N/A 07/24/2021   Procedure: ESOPHAGOGASTRODUODENOSCOPY (EGD) WITH PROPOFOL WITH FLUOROSCOPY;  Surgeon: Meridee Score Netty Starring., MD;  Location: [facility] ENDOSCOPY;  Service: Gastroenterology;  Laterality: N/A;   EUS N/A 07/24/2021   Procedure:  UPPER ENDOSCOPIC ULTRASOUND (EUS) RADIAL;  Surgeon: Meridee Score Netty Starring., MD;  Location: [facility] ENDOSCOPY;  Service: Gastroenterology;  Laterality: N/A;   lumbar spine stimulator     POLYPECTOMY  06/06/2018   Procedure: POLYPECTOMY;  Surgeon: Mansouraty, Netty Starring., MD;  Location: [facility] ENDOSCOPY;  Service: Gastroenterology;;   REMOVAL OF STONES  03/30/2018   Procedure: REMOVAL OF STONES;  Surgeon: Lemar Lofty., MD;  Location: [facility] ENDOSCOPY;  Service: Gastroenterology;;   REMOVAL OF STONES  06/05/2021   Procedure: REMOVAL OF STONES;  Surgeon: Lemar Lofty., MD;  Location: [facility] ENDOSCOPY;  Service: Gastroenterology;;   REMOVAL OF STONES  09/15/2021   Procedure: REMOVAL OF STONES;  Surgeon: Lemar Lofty., MD;  Location: [facility] ENDOSCOPY;  Service: Gastroenterology;;   Dennison Mascot  01/08/2018   Procedure: Dennison Mascot;  Surgeon: Lynann Bologna, MD;  Location:  [facility] ENDOSCOPY;  Service: Endoscopy;;   SPHINCTEROTOMY  02/09/2018   Procedure: Dennison Mascot;  Surgeon: Mansouraty, Netty Starring., MD;  Location: [facility] ENDOSCOPY;  Service: Gastroenterology;;   [facility] CHOLANGIOSCOPY N/A 06/05/2021   Procedure: ZOXWRUEA CHOLANGIOSCOPY;  Surgeon: Lemar Lofty., MD;  Location: [facility] ENDOSCOPY;  Service: Gastroenterology;  Laterality: N/A;   STENT REMOVAL  02/09/2018   Procedure: STENT REMOVAL;  Surgeon: Lemar Lofty., MD;  Location: [facility] ENDOSCOPY;  Service: Gastroenterology;;   Francine Graven REMOVAL  03/30/2018   Procedure: STENT REMOVAL;  Surgeon: Lemar Lofty., MD;  Location: [facility] ENDOSCOPY;  Service: Gastroenterology;;   Francine Graven REMOVAL  06/05/2021   Procedure: STENT REMOVAL;  Surgeon: Lemar Lofty., MD;  Location: [facility] ENDOSCOPY;  Service: Gastroenterology;;   TRANSTHORACIC ECHOCARDIOGRAM  04/2015   EF 40-45%, mod antsept, ant, antlat, apical HK, mild AI/MR, PASP .   TRANSTHORACIC ECHOCARDIOGRAM  08/23/2020   EF 55 to 60%.  No RWMA.  Mild LVH.  GR 1 DD.  Normal RV size and function with normal RVP.  Normal mitral and aortic valve.   UVULOPALATOPHARYNGOPLASTY, TONSILLECTOMY AND SEPTOPLASTY  2002   VIDEO BRONCHOSCOPY N/A 10/29/2022   Procedure: VIDEO BRONCHOSCOPY WITH FLUORO;  Surgeon: [name], MD;  Location: [facility] ENDOSCOPY;  Service: Pulmonary;  Laterality: N/A;    No Known Allergies  Immunization History  Administered Date(s) Administered   Fluad Quad(high Dose 65+) 01/20/2022   Influenza, High Dose Seasonal PF 03/04/2016, 02/21/2018, 02/23/2019, 03/11/2021, 02/03/2023   Influenza, Seasonal, Injecte, Preservative Fre 05/03/2012, 05/05/2014, 02/28/2015   Influenza,inj,Quad PF,6+ Mos 02/27/2013   Influenza-Unspecified 03/17/2017   PFIZER Comirnaty(Gray Top)Covid-19 Tri-Sucrose Vaccine 03/23/2020, 09/28/2020   PFIZER(Purple Top)SARS-COV-2 Vaccination 08/25/2019, 09/19/2019   Pfizer(Comirnaty)Fall Seasonal Vaccine 12 years  and older 02/03/2023   Pneumococcal Conjugate-13 04/28/2015   Pneumococcal Polysaccharide-23 10/30/2016   Respiratory Syncytial Virus Vaccine,Recomb Aduvanted(Arexvy) 01/20/2022   Td 05/03/2012   Zoster Recombinant(Shingrix) 01/20/2022, 05/01/2022   Zoster, Live 06/29/2013    Family History  Problem Relation Age of Onset   Diabetes Mother    Heart disease Mother    Cancer Sister 34       male cancer   Breast cancer Brother 17   Hypertension Maternal Aunt    Cancer Maternal Uncle        spinal   Coronary artery disease Neg Hx    Stroke Neg Hx    Colon cancer Neg Hx    Pancreatic cancer Neg Hx    Stomach cancer Neg Hx    Esophageal cancer Neg Hx    Inflammatory bowel disease Neg Hx    Liver disease Neg Hx    Rectal cancer Neg Hx  Current Outpatient Medications:    acetaminophen (TYLENOL) 500 MG tablet, Take 1,000 mg by mouth every 6 (six) hours as needed for moderate pain or headache. , Disp: , Rfl:    Ascorbic Acid (VITAMIN C) 1000 MG tablet, Take 1,000 mg by mouth daily., Disp: , Rfl:    Carboxymethylcellulose Sodium (LUBRICANT EYE DROPS OP), Place 2 drops into both eyes daily as needed (for dry eyes)., Disp: , Rfl:    Cholecalciferol (VITAMIN D3) 50 MCG (2000 UT) TABS, Take 2,000 Units by mouth daily., Disp: , Rfl:    gabapentin (NEURONTIN) 600 MG tablet, TAKE 2 TABLETS BY MOUTH 3 TIMES  DAILY, Disp: 600 tablet, Rfl: 3   ibuprofen (ADVIL) 200 MG tablet, Take 400 mg by mouth every 6 (six) hours as needed for headache or moderate pain., Disp: , Rfl:    Magnesium 250 MG TABS, Take 1 tablet (250 mg total) by mouth at bedtime. (Patient taking differently: Take 250 mg by mouth at bedtime.), Disp: , Rfl: 0   mirabegron ER (MYRBETRIQ) 25 MG TB24 tablet, Take 1 tablet (25 mg total) by mouth daily., Disp: 90 tablet, Rfl: 1   omeprazole (PRILOSEC) 40 MG capsule, Take 1 capsule (40 mg total) by mouth 2 (two) times daily before a meal., Disp: 180 capsule, Rfl: 4   rosuvastatin  (CRESTOR) 20 MG tablet, Take 1 tablet (20 mg total) by mouth daily., Disp: 90 tablet, Rfl: 3   tamsulosin (FLOMAX) 0.4 MG CAPS capsule, TAKE 1 CAPSULE BY MOUTH EVERY DAY, Disp: 90 capsule, Rfl: 3   vitamin B-12 (CYANOCOBALAMIN) 1000 MCG tablet, Take 1,000 mcg by mouth daily., Disp: , Rfl:    vitamin E 400 UNIT capsule, Take 400 Units by mouth daily., Disp: , Rfl:    mycophenolate (CELLCEPT) 500 MG tablet, Take 3 tablets (1,500 mg total) by mouth 2 (two) times daily., Disp: 540 tablet, Rfl: 1   predniSONE (DELTASONE) 10 MG tablet, Take 1 tablet (10 mg total) by mouth daily with breakfast., Disp: 90 tablet, Rfl: 1   [START ON 08/20/2023] sulfamethoxazole-trimethoprim (BACTRIM DS) 800-160 MG tablet, Take 1 tablet by mouth 3 (three) times a week., Disp: 36 tablet, Rfl: 1      Objective:   Vitals:   08/19/23 1309  BP: 127/77  Pulse: 100  Temp: 97.8 F (36.6 C)  TempSrc: Oral  SpO2: 95%  Weight: 218 lb 9.6 oz (99.2 kg)  Height: 5\' 10"  (1.778 m)    Estimated body mass index is 31.37 kg/m as calculated from the following:   Height as of this encounter: 5\' 10"  (1.778 m).   Weight as of this encounter: 218 lb 9.6 oz (99.2 kg).  @WEIGHTCHANGE @  [employer]   08/19/23 1309  Weight: 218 lb 9.6 oz (99.2 kg)     Physical Exam   General: No distress.  Was quite labored after doing sit stand test O2 at rest: Initially no but later yes. Cane present: Uses polio  braces Sitting in wheel chair: no Frail: no Obese: yes Neuro: Alert and Oriented x 3. GCS 15. Speech normal Psych: Pleasant Resp:  Barrel Chest - no.  Wheeze - no, Crackles - no, No overt respiratory distress CVS: Normal heart sounds. Murmurs - no Ext: Stigmata of Connective Tissue Disease - no HEENT: Normal upper airway. PEERL +. No post nasal drip        Assessment:       ICD-10-CM   1. ILD (interstitial lung disease) (HCC)  J84.9 mycophenolate (CELLCEPT) 500 MG  tablet    CBC w/Diff    Comp Met (CMET)    B Nat  Peptide    CT Chest High Resolution    2. Hypersensitivity pneumonitis (HCC)  J67.9 mycophenolate (CELLCEPT) 500 MG tablet    CBC w/Diff    Comp Met (CMET)    B Nat Peptide    CT Chest High Resolution    3. Long-term use of high-risk medication  Z79.899 CBC w/Diff    Comp Met (CMET)    B Nat Peptide    CT Chest High Resolution    4. Need for pneumocystis prophylaxis  Z29.89 sulfamethoxazole-trimethoprim (BACTRIM DS) 800-160 MG tablet    CBC w/Diff    Comp Met (CMET)    B Nat Peptide    CT Chest High Resolution    5. Encounter for medication management  Z79.899 CBC w/Diff    Comp Met (CMET)    B Nat Peptide    CT Chest High Resolution    6. Chronic cough  R05.3 CBC w/Diff    Comp Met (CMET)    B Nat Peptide    CT Chest High Resolution    7. Dyspnea on exertion  R06.09 CBC w/Diff    Comp Met (CMET)    B Nat Peptide    CT Chest High Resolution    8. Exercise hypoxemia  R09.02 CBC w/Diff    Comp Met (CMET)    B Nat Peptide    CT Chest High Resolution    9. High risk medication use  Z79.899 sulfamethoxazole-trimethoprim (BACTRIM DS) 800-160 MG tablet    CBC w/Diff    Comp Met (CMET)    B Nat Peptide    CT Chest High Resolution         Plan:     Patient Instructions     ICD-10-CM   1. ILD (interstitial lung disease) (HCC)  J84.9     2. Hypersensitivity pneumonitis (HCC)  J67.9     3. Long-term use of high-risk medication  Z79.899     4. Need for pneumocystis prophylaxis  Z29.89     5. Encounter for medication management  Z79.899     6. Chronic cough  R05.3     7. Dyspnea on exertion  R06.09     8. Exercise hypoxemia  R09.02      #ILD - need to rule out worsening  #chronic cough -this might be reflective of your interstitial lung disease because it seems to get worse when you drop your oxygen and so for sinus scan and allergy blood work are negative   Plan - continue prednisone at 10 mg/day [do not taper below for the moment] -Continue CellCept  1500 mg twice daily -Start antifibrotic nintedanib per protocol 150 mg twice a day  -Extensively counseled on side effect profile -Check CBC with diff, chemistry and liver function test and CK 08/19/2023 --Continue oxygen 2 L nasal cannula with exertion and at night  -Be more diligent about wearing her oxygen because if your oxygen does not drop your cough might be better -Do first available spirometry and I will see you  - Do high-resolution CT chest in the next few weeks  Follow-up -3 weeks video visit Dr. Marchelle Gearing to discuss CT scan results  -Video visit 15-minute fine -6 weeks face-to-face visit with Dr. Marchelle Gearing but after breathing test  -Definitely 30-minute visit   FOLLOWUP Return for 15-minute video visit in 3 weeks after CT scan.  30-min face-to-face visit in 6 ws  after spiro DLCO.    SIGNATURE    Dr. Kalman Shan, M.D., F.C.C.P,  Pulmonary and Critical Care Medicine Staff Physician, [facility] Director - Interstitial Lung Disease  Program  Pulmonary Fibrosis [facility] [place], [place], [address]  Pager: [phone], If no answer or between  15:00h - 7:00h: call [phone] Telephone: [phone]  1:43 PM 08/19/2023   HIGh Complexity  OFFICE   2021 E/M guidelines, first released in 2021, with minor revisions added in 2023. Must meet the requirements for 2 out of 3 dimensions to qualify.    Number and complexity of problems addressed Amount and/or complexity of data reviewed Risk of complications and/or morbidity  Severe exacerbation of chronic illness  Acute or chronic illnesses that may pose a threat to life or bodily function, e.g., multiple trauma, acute MI, pulmonary embolus, severe respiratory distress, progressive rheumatoid arthritis, psychiatric illness with potential threat to self or others, peritonitis, acute renal failure, abrupt change in neurological status Must meet the requirements  for 2 of 3 of the categories)  Category 1: Tests and documents, historian  Any combination of 3 of the following:  Assessment requiring an independent historian  Review of prior external note(s) from each unique source  Review of results of each unique test  Ordering of each unique test    Category 2: Interpretation of tests    Independent interpretation of a test performed by another physician/other qualified health care professional (not separately reported)  Category 3: Discuss management/tests  Discussion of management or test interpretation with external physician/other qualified health care professional/appropriate source (not separately reported)  HIGH risk of morbidity from additional diagnostic testing or treatment Examples only:  Drug therapy requiring intensive monitoring for toxicity  Decision for elective major surgery with identified pateint or procedure risk factors  Decision regarding hospitalization or escalation of level of care  Decision for DNR or to de-escalate care   Parenteral controlled  substances

## 2023-08-19 NOTE — Telephone Encounter (Signed)
 I called and spoke to pt. Pt was informed to stop by between 8:30 am - 12:00 pm to fill out his part of the PAP for Ofe[employer]. Pt verbalized understanding. These papers will be paper clipped together in Dr Jane Canary cabinet. MR has already signed, just needs pt's part and signature and then can be given to RX team

## 2023-08-19 NOTE — Patient Instructions (Addendum)
 ICD-10-CM   1. ILD (interstitial lung disease) (HCC)  J84.9     2. Hypersensitivity pneumonitis (HCC)  J67.9     3. Long-term use of high-risk medication  Z79.899     4. Need for pneumocystis prophylaxis  Z29.89     5. Encounter for medication management  Z79.899     6. Chronic cough  R05.3     7. Dyspnea on exertion  R06.09     8. Exercise hypoxemia  R09.02      #ILD - need to rule out worsening  #chronic cough -this might be reflective of your interstitial lung disease because it seems to get worse when you drop your oxygen and so for sinus scan and allergy blood work are negative   Plan - continue prednisone at 10 mg/day [do not taper below for the moment] -Continue CellCept 1500 mg twice daily -Start antifibrotic nintedanib per protocol 150 mg twice a day  -Extensively counseled on side effect profile -Check CBC with diff, chemistry and liver function test and CK 08/19/2023 --Continue oxygen 2 L nasal cannula with exertion and at night  -Be more diligent about wearing her oxygen because if your oxygen does not drop your cough might be better -Do first available spirometry and I will see you  - Do high-resolution CT chest in the next few weeks  Follow-up -3 weeks video visit Dr. Marchelle Gearing to discuss CT scan results  -Video visit 15-minute fine -6 weeks face-to-face visit with Dr. Marchelle Gearing but after breathing test  -Definitely 30-minute visit

## 2023-08-19 NOTE — Telephone Encounter (Signed)
 Will start Ofev benefits investigation

## 2023-08-25 DIAGNOSIS — E785 Hyperlipidemia, unspecified: Secondary | ICD-10-CM | POA: Diagnosis not present

## 2023-08-25 LAB — COMPREHENSIVE METABOLIC PANEL WITH GFR
ALT: 19 U/L (ref 0–53)
AST: 16 U/L (ref 0–37)
Albumin: 4.6 g/dL (ref 3.5–5.2)
Alkaline Phosphatase: 46 U/L (ref 39–117)
BUN: 24 mg/dL — ABNORMAL HIGH (ref 6–23)
CO2: 22 meq/L (ref 19–32)
Calcium: 9.3 mg/dL (ref 8.4–10.5)
Chloride: 105 meq/L (ref 96–112)
Creatinine, Ser: 0.58 mg/dL (ref 0.40–1.50)
GFR: 96.77 mL/min (ref >60.00)
Glucose, Bld: 131 mg/dL — ABNORMAL HIGH (ref 70–99)
Potassium: 4 meq/L (ref 3.5–5.1)
Sodium: 138 meq/L (ref 135–145)
Total Bilirubin: 0.8 mg/dL (ref 0.2–1.2)
Total Protein: 7.6 g/dL (ref 6.0–8.3)

## 2023-08-25 LAB — CBC WITH DIFFERENTIAL/PLATELET
Basophils Absolute: 0 10*3/uL (ref 0.0–0.1)
Basophils Relative: 0.3 % (ref 0.0–3.0)
Eosinophils Absolute: 0 10*3/uL (ref 0.0–0.7)
Eosinophils Relative: 0.3 % (ref 0.0–5.0)
HCT: 44.5 % (ref 39.0–52.0)
Hemoglobin: 15 g/dL (ref 13.0–17.0)
Lymphocytes Relative: 13.6 % (ref 12.0–46.0)
Lymphs Abs: 1.4 10*3/uL (ref 0.7–4.0)
MCHC: 33.7 g/dL (ref 30.0–36.0)
MCV: 100.9 fl — ABNORMAL HIGH (ref 78.0–100.0)
Monocytes Absolute: 0.5 10*3/uL (ref 0.1–1.0)
Monocytes Relative: 4.8 % (ref 3.0–12.0)
Neutro Abs: 8.5 10*3/uL — ABNORMAL HIGH (ref 1.4–7.7)
Neutrophils Relative %: 81 % — ABNORMAL HIGH (ref 43.0–77.0)
Platelets: 256 10*3/uL (ref 150.0–400.0)
RBC: 4.41 Mil/uL (ref 4.22–5.81)
RDW: 14 % (ref 11.5–15.5)
WBC: 10.5 10*3/uL (ref 4.0–10.5)

## 2023-08-26 LAB — LIPID PANEL
Chol/HDL Ratio: 2 ratio (ref 0.0–5.0)
Cholesterol, Total: 135 mg/dL (ref 100–199)
HDL: 68 mg/dL (ref >39)
LDL Chol Calc (NIH): 47 mg/dL (ref 0–99)
Triglycerides: 116 mg/dL (ref 0–149)
VLDL Cholesterol Cal: 20 mg/dL (ref 5–40)

## 2023-08-26 LAB — BRAIN NATRIURETIC PEPTIDE: Pro B Natriuretic peptide (BNP): 15 pg/mL (ref 0.0–100.0)

## 2023-08-29 ENCOUNTER — Encounter: Payer: Self-pay | Admitting: Cardiology

## 2023-08-30 ENCOUNTER — Other Ambulatory Visit (HOSPITAL_COMMUNITY): Payer: Self-pay

## 2023-08-30 NOTE — Telephone Encounter (Signed)
 Received notification from [facility] regarding a prior authorization for OFEV. Authorization has been APPROVED from 08/19/2023 to 05/31/2024. Approval letter sent to scan center.  Per test claim, copay for 30 days supply is $1,056.52  Authorization # [id number]  Contacted pt's wife to discuss, she is going to fax copies of their 1099 forms as well as documentation of his pension collections. [employer] fax number provided, will await f/u.  Application placed in "PAP Pending" folder.

## 2023-09-01 ENCOUNTER — Other Ambulatory Visit: Payer: Self-pay | Admitting: Internal Medicine

## 2023-09-02 ENCOUNTER — Ambulatory Visit
Admission: RE | Admit: 2023-09-02 | Discharge: 2023-09-02 | Disposition: A | Discharge status: Home / Self Care | Source: Ambulatory Visit | Attending: Internal Medicine | Admitting: Internal Medicine

## 2023-09-02 DIAGNOSIS — J849 Interstitial pulmonary disease, unspecified: Secondary | ICD-10-CM

## 2023-09-02 DIAGNOSIS — Z2989 Encounter for other specified prophylactic measures: Secondary | ICD-10-CM

## 2023-09-02 DIAGNOSIS — R0609 Other forms of dyspnea: Secondary | ICD-10-CM

## 2023-09-02 DIAGNOSIS — Z79899 Other long term (current) drug therapy: Secondary | ICD-10-CM

## 2023-09-02 DIAGNOSIS — J8410 Pulmonary fibrosis, unspecified: Secondary | ICD-10-CM | POA: Diagnosis not present

## 2023-09-02 DIAGNOSIS — R053 Chronic cough: Secondary | ICD-10-CM

## 2023-09-02 DIAGNOSIS — R0902 Hypoxemia: Secondary | ICD-10-CM

## 2023-09-02 DIAGNOSIS — J679 Hypersensitivity pneumonitis due to unspecified organic dust: Secondary | ICD-10-CM

## 2023-09-06 NOTE — Telephone Encounter (Signed)
 Submitted Patient Assistance Application to [facility] for OFEV along with provider portion, patient portion, PA, medication list, insurance card copy and income documents. Will update patient when we receive a response.  Phone #: [phone] Fax #: [phone]

## 2023-09-15 MED ORDER — OFEV 150 MG PO CAPS: 150.0000 mg | ORAL_CAPSULE | Freq: Two times a day (BID) | ORAL | 1 refills | Status: DC

## 2023-09-15 NOTE — Telephone Encounter (Signed)
 Received fax from [facility] for OFEV patient assistance, patient's application has been DENIED due to not meeting income eligibility criteria.    Phone #: [phone] Fax #: [phone]  Enrolled patient into pulmonary fibrosis grant through [employer]: Award Period: 03/19/2023 - 09/14/2024 ID: [id number] BIN: [id number] PCN: [id number] Group: [id number] For pharmacy inquiries, contact PDMI at [phone]. For patient inquiries, contact PAF at [phone].  Geraldene Kleine, PharmD, MPH, BCPS, CPP Clinical Pharmacist (Rheumatology and Pulmonology)

## 2023-09-15 NOTE — Telephone Encounter (Signed)
 Spoke with patient's wife regarding Ofev coverage and information. She will work with patient to contact specialty pharmacy and set up shipment and provide grant information. She is aware that information has been sent in myChart  Geraldene Kleine, PharmD, MPH, BCPS, CPP Clinical Pharmacist (Rheumatology and Pulmonology)

## 2023-09-16 ENCOUNTER — Other Ambulatory Visit: Payer: Self-pay

## 2023-09-16 DIAGNOSIS — J849 Interstitial pulmonary disease, unspecified: Secondary | ICD-10-CM

## 2023-09-20 ENCOUNTER — Ambulatory Visit: Admitting: Internal Medicine

## 2023-09-20 DIAGNOSIS — J849 Interstitial pulmonary disease, unspecified: Secondary | ICD-10-CM

## 2023-09-20 LAB — PULMONARY FUNCTION TEST
DL/VA % pred: 87 %
DL/VA: 3.5 ml/min/mmHg/L
DLCO cor % pred: 45 %
DLCO cor: 11.65 ml/min/mmHg
DLCO unc % pred: 46 %
DLCO unc: 11.78 ml/min/mmHg
FEF 25-75 Pre: 2.73 L/s
FEF2575-%Pred-Pre: 117 %
FEV1-%Pred-Pre: 61 %
FEV1-Pre: 1.92 L
FEV1FVC-%Pred-Pre: 117 %
FEV6-%Pred-Pre: 55 %
FEV6-Pre: 2.24 L
FEV6FVC-%Pred-Pre: 106 %
FVC-%Pred-Pre: 51 %
FVC-Pre: 2.24 L
Pre FEV1/FVC ratio: 86 %
Pre FEV6/FVC Ratio: 100 %

## 2023-09-20 NOTE — Progress Notes (Signed)
 FVC and DLCO performed today

## 2023-09-20 NOTE — Patient Instructions (Signed)
 FVC and DLCO

## 2023-10-06 ENCOUNTER — Other Ambulatory Visit: Payer: Self-pay | Admitting: Family Medicine

## 2023-10-06 ENCOUNTER — Other Ambulatory Visit: Payer: Self-pay | Admitting: Internal Medicine

## 2023-10-06 DIAGNOSIS — Z2989 Encounter for other specified prophylactic measures: Secondary | ICD-10-CM

## 2023-10-06 DIAGNOSIS — Z79899 Other long term (current) drug therapy: Secondary | ICD-10-CM

## 2023-10-06 DIAGNOSIS — N3941 Urge incontinence: Secondary | ICD-10-CM

## 2023-10-13 ENCOUNTER — Telehealth: Admitting: Internal Medicine

## 2023-10-27 ENCOUNTER — Other Ambulatory Visit: Payer: Self-pay | Admitting: Family Medicine

## 2023-10-27 DIAGNOSIS — N3941 Urge incontinence: Secondary | ICD-10-CM

## 2023-10-28 NOTE — Progress Notes (Unsigned)
 73yM with history of CAD, HTN, ICM, OSA not on CPAP, pAF, very light smoking in past. Had covid-19 a year ago in January was not hospitalized with it.   Coughing, short of breath, little energy over last 4 months. Cough is dry occasionally productive of sputum but he just swallows it. He does have sinus congestion and postnasal drainage. He tried flonase on and off since November. Benadryl here and there. He is on omeprazole  BID for reflux. No solid/liquid food dysphagia.   Got steroid course in October/Nov, steroids did not help. Had another round of steroids/antibiotic which did not help.   Had polio as a kid and started to need braces/crutches in 2002.   Some morning stiffness but takes 10-15 min to loosen up, no rashes, raynaud's phenomenon, ulcers in mouth or nose.   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 Some exposure to cleaning agents in past, some solvents. No MJ, vaping. Born in [place], lived for 40y in [place] and then was in [place] in [place] , moved to [place]. 3 dogs. No hot tub.   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 Jenny Mohs  Time of Conference: 7.00am- 8.00am Date of conference: 09/08/2022 Location of Conference: -  Virtual  Participating Pulmonary: Dr. Maire Scot, Dr Phyllis Breeze, Dr. Arline Laity Pathology: Dr Lee Public Radiology: Dr Hubbard Mad  Others:   Brief History: 73 year old was on amio for 6 months. Has post polio syndrome. Has severe GERD . [place] Does a LOT of GARDENING. When lived in [place] it was damp. Serology negative. 5-6 months of symptoms. HAd mild covid a  year ago. Does this seem to be consistent with chronic hp from dust exposure enough to treat empirically?   Should I alternatively send for surgical lung biopsy or instead consider bronchoscopy/BAL for cell count/diff first?   MDD discussion of CT scan    - Date or time period of scan: HRCT: 07/24/2022   - Discussion synopsis:  clearest case of alternative diagnosis. Has quite a bit of traction bronchiectasis.  There is lot of air trapping . Upper lobe predminant.   - What is the final conclusion per 2018 ATS/Fleischner Criteria - Leading differential is Chronic HP  - Concordance with official report: CONCORDANT  Pathology discussion of biopsy: No lung biopsy    MDD Impression/Recs: Suspect chronic HP ., REC a) send HP panel; b) ILD questionnaire; c) shared decision making for bronch BAL/Ttbx for histopath and envisia  OV May 2024   Interval HPI:  ILD conference recommends shared decision-making discussion wrt bronch/BAL/TBLB  PSG with moderate OSA AHI of 20 and SpO2 low of 75%  Does wear mask when he heads out to do yardwork.  Overall feels more energetic than at last visit.   Otherwise pertinent review of systems is  negative.  xxxxxxxxxxxxxxxxxxxxxxxxxxxxxxxxxxxx[id number] Interval HPI: June 2024  Underwent bronch/BAL/tblb 5/30. BAL diff with 18% lymphs, 11% eos.  NEGATIVE ENVISIA. Non-diagnostic TTBX for Histo pat  Gabapentin , ibuprofen  with case report of eosinophilic pna but not this pattern Rosuvastatin  with ILD but not this pattern  Overall still with a lack of energy. No change in DOE, cough. Has not yet set up appointment to get  autoPAP, playing phone tag with [employer].   Otherwise pertinent review of systems is negative.  - Start prednisone  30 mg daily - see instructions for taper  - Spirometry next visit - see you in 2-3 months or sooner if need be!  OV 01/28/2023 -transfer of care from Dr. Arline Laity to Dr. Bertrum Brodie in the ILD center.  Subjective:  Patient ID: Alexander [name], male , DOB: 1950/06/02 , age 73 y.o. , MRN: [id number] , ADDRESS: [address] [place] [address] PCP Claire Crick, MD Patient Care Team: Claire Crick, MD as PCP - General (Family Medicine) Arleen Lacer, MD as PCP - Cardiology (Cardiology) Arleen Lacer, MD as Consulting Physician (Cardiology) Valiant Gaul, MD (Inactive) as Consulting Physician (Pulmonary Disease)  This Provider for this visit: Treatment Team:  Attending Provider: Maire Scot, MD    01/28/2023 -   Chief Complaint  Patient presents with   Consult    Former pt of Dr. Jenny Mohs.  Discuss labs and cxr/scan     HPI SAJJAD [name] 73 y.o. -presents for follow-up.  History is provided by reviewing of the prior records and also talking to his wife and talking to him   #background: He grew up in [place] .  At age 73 he suffered from infantile polio.  After that he recovered he just walked with a limp.  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  He also then worked for [employer]  during the same time.Aaron Aas  He is to smoke occasionally during this time.  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  No visible mold.  He then retired and then moved to [place] because his wife is originally from [place]  and his sister-in-law lives in this area.  In the last few years his polio has flared up and is now using crutches.  #ILD reviewed the CT scans of the chest suggest  that he had ILD even in 2016 and even in  and 2022.  In early 2023 he suffered from COVID-19.  Then by October 2023 started having a cough and then established with Dr. Arline Laity in early 2024.  In my personal visualization the ILD has progressed.  In conference they have raised the specter of hypersensitive pneumonitis because of air trapping and the features.  I personally agree with this.  His main symptoms was cough followed by shortness of breath.  During this time he had a bronchoscopy with lavage is mixed cellularity.  Nondiagnostic on RNA genomic classifier and histopathology transbronchial biopsies.  However this does not preclude chronic HP.  His serologies are essentially normal.  By mid June 2024 after the ILD conference started on prednisone  30 mg/day.  He slowly taper this and currently on 10 mg.  The cough resolved but according to his wife is coming back now at the lower dose of prednisone .  Uses oxygen  for the  last 1 month.  Today room air at rest was 94%.       OV 03/18/2023  Subjective:  Patient ID: Alexander [name], male , DOB: Oct 26, 1950 , age 29 y.o. , MRN: [id number] , ADDRESS: [address] [place] [address] PCP Claire Crick, MD Patient Care Team: Claire Crick, MD as PCP - General (Family Medicine) Arleen Lacer, MD as PCP - Cardiology (Cardiology) Arleen Lacer, MD as Consulting Physician (Cardiology) Valiant Gaul, MD (Inactive) as Consulting Physician (Pulmonary Disease)  This Provider for this visit: Treatment Team:  Attending Provider: Maire Scot, MD    03/18/2023 -   Chief Complaint  Patient presents with   Follow-up    ILD- Doing ok SOB is getting worse and his cough is worse now that he is done with prednisone . UTD on flu, covid, pneumo and rsv vaccines. Wants to go over labs. Lots of mucous.  Before sit stand O2 on room air 94 pulse of 101. After sit stand O2 was 82 and pulse was 129.   HPI WALLIS VANCOTT  73 y.o. -returns for follow-up.    #Interstitial lung disease and chronic hypersensitive pneumonitis associate with high risk prescription and encounter for therapeutic monitoring  -Oxygen  requirement is the same.  Symptom scores around stable but he does tell me that and his wife also affirms that since going up on the CellCept  1000 mg twice daily he is a little more fatigued.  This also corresponds with him going down on the prednisone  to 10 mg/day.  Took a shared decision making for him to hold at this dose without having to either taper the prednisone  further or escalate the CellCept  to 1500 mg twice daily.  He is okay with this.  Today we will get safety labs.  He continues on his 2 L oxygen  with exercise and at night.  Unclear if he stopped his fish oil  #New complaint: He is a lot of grandkids around him and he feels because of this exposure he is having chronic sinus complaint.  For the last few days it is also worse with greenish drainage.  He is already on Bactrim  for PCP prophylaxis.   OV 08/19/2023  Subjective:  Patient ID: Alexander [name], male , DOB: Jan 01, 1951 , age 89 y.o. , MRN: [id number] , ADDRESS: [address] [place] [address] PCP Claire Crick, MD Patient Care Team: Claire Crick, MD as PCP - General (Family Medicine) Arleen Lacer, MD as PCP - Cardiology (Cardiology) Arleen Lacer, MD as Consulting Physician (Cardiology) Valiant Gaul, MD (Inactive) as Consulting Physician (Pulmonary Disease)  This Provider for this visit: Treatment Team:  Attending Provider: Maire Scot, MD   Interstitial lung disease chronic hypersensitive pneumonitis  -Etiology mold exposure while working in [facility] many years ago  -Diagnosis given end of August 2024  -Progressive phenotype with COVID in 2022 stirring the pot   -   on prednisone  since mid June 2024   -Started CellCept  and prophylactic Bactrim  mid September 2024  Sleep apnea: CPAP  compliance report July 2024 through mid October 2024: 77% compliance for greater than 4 hours and 19% compliance for less than 4 hours a total of 96% compliance.  Severe chronic cough   - exertional  -  - RAST panel  negative  - CT sinus result pending  #Braces for walking-history of polio.  #High risk medication with CellCept   -Immunosuppressant with multiple toxicities including opportunistic infections that  require intensive therapeutic monitoring  #High risk medication with chronic prednisone      08/19/2023 -   Chief Complaint  Patient presents with   Follow-up     HPI TRAVORIS BUSHEY 73 y.o. -presents with his wife.  It appears that overall he is stable.  It is dyspnea score shows he is stable.  However when he did exercise hypoxemia test he seemed to desaturate faster than back in October 2024.  Back in October 2024 for sinus complaints work the biggest issue but CT scan sinus was noncontributory.  He also improved to Augmentin .  In January 2025 he did have influenza treated as an outpatient.  He feels stable but his biggest complaint is his quality of life.  Minimal exertion makes him desaturate and minimal exertion makes him cough significantly.  He does not always use his oxygen  with exertion because of his polio braces.  But he feels miserable.  He is asking for stronger medications.  He is now completed for few months on CellCept  overall he is tolerating it well.  He needs a safety check today.     We discussed nintedanib - Nintedanib/Ofev  requires intensive drug monitoring due to high concerns for Adverse effects of , including  Drug Induced Liver Injury, significant GI side effects that include but not limited to Diarrhea, Nausea, Vomiting,  and other system side effects that include Fatigue,  weight loss. Cardiac side effects are a black box warning as well. These will be monitored with  blood work such as LFT initially once a month for 6 months and then quarterly  - He  agreed to start      OV 10/28/2023  Subjective:  Patient ID: SAED HUDLOW, male , DOB: 1951-04-21 , age 72 y.o. , MRN: [id number] , ADDRESS: [address] [place] [address] PCP Claire Crick, MD Patient Care Team: Claire Crick, MD as PCP - General (Family Medicine) Arleen Lacer, MD as PCP - Cardiology (Cardiology) Arleen Lacer, MD as Consulting Physician (Cardiology) Valiant Gaul, MD (Inactive) as Consulting Physician (Pulmonary Disease)  This Provider for this visit: Treatment Team:  Attending Provider: Maire Scot, MD    10/28/2023 -  No chief complaint on file.    HPI LOVELLE LEMA 73 y.o. -      SYMPTOM SCALE - ILD 01/28/2023 03/18/2023  08/19/2023   Current weight Ra 94%    O2 use 2L  Not really good about wearing his portable oxygen  with exertion  Shortness of Breath 0 -> 5 scale with 5 being worst (score 6 If unable to do)    At rest 3 1 2   Simple tasks - showers, clothes change, eating, shaving 3 3 3   Household (dishes, doing bed, laundry) 3 3 3   Shopping 4 1 0  Walking level at own pace 5 5 3   Walking up Stairs 5 5 5   Total (30-36) Dyspnea Score 23 18 16   How bad is your cough? 3 4 4   How bad is your fatigue 3 4 4   How bad is nausea 0 0 0  How bad is vomiting?  0 0 0  How bad is diarrhea? 0 1 0  How bad is anxiety? 0 0 3  How bad is depression 0 0 1  Any chronic pain - if so where and how bad 0  0    Simple office walk 224 (66+46 x 2) feet Pod A at [facility] x  3 laps goal  with forehead probe 03/18/2023  08/19/2023   O2 used ra ra  Number laps completed Sit stand x 15 Sit stand times 10  Comments about pace Nrmal pace   Resting Pulse Ox/HR 94% and 102/min 100% with heart rate of 103  Final Pulse Ox/HR 82% and 129/min 85% with a heart rate of 142  Desaturated </= 88% yes Yes  Desaturated <= 3% points yes Yes yes  Got Tachycardic >/= 90/min yes Yes  Symptoms at end of test Dyspnea  Very dyspneic and  labored  Miscellaneous comments x ?  Worse   CT Chest data from date: ****  - personally visualized and independently interpreted : *** - my findings are: ***   PFT     Latest Ref Rng & Units 09/20/2023    9:33 AM 04/02/2023   10:40 AM 08/21/2022   10:42 AM 08/01/2015    2:22 PM  PFT Results  FVC-Pre L 2.24  2.41  2.42  5.42  P  FVC-Predicted Pre % 51  55  55  113  P  FVC-Post L   2.12  5.35  P  FVC-Predicted Post %   48  112  P  Pre FEV1/FVC % % 86  86  84  85  P  Post FEV1/FCV % %   90  87  P  FEV1-Pre L 1.92  2.07  2.04  4.58  P  FEV1-Predicted Pre % 61  65  64  129  P  FEV1-Post L   1.92  4.65  P  DLCO uncorrected ml/min/mmHg 11.78  9.61  17.86  20.37  P  DLCO UNC% % 46  37  69  60  P  DLCO corrected ml/min/mmHg 11.65  9.50  17.86    DLCO COR %Predicted % 45  37  69    DLVA Predicted % 87  68  93  57  P  TLC L   4.08  7.55  P  TLC % Predicted %   58  104  P  RV % Predicted %   59  82  P    P Preliminary result       LAB RESULTS last 96 hours No results found.       has a past medical history of Acute cholecystitis (03/2016), Arthritis, CAD S/P percutaneous coronary angioplasty (1995, 2000, 2005), Calculus of bile duct with acute cholangitis with obstruction, Chronic pain syndrome, Complication of anesthesia (04/01/2021), Duodenal stricture, Essential hypertension, Fatty liver, Gallstones, GERD (gastroesophageal reflux disease), History of depression, History of post poliomyelitis muscular atrophy, HLD (hyperlipidemia), Ischemic cardiomyopathy, Migraines, Muscle weakness following poliomyelitis, Neuromuscular disorder (HCC), Neuropathy, OSA (obstructive sleep apnea), PAF (paroxysmal atrial fibrillation) (HCC), Pneumonia (04/2015), Septic shock (HCC) (01/07/2018), and Shoulder pain, right.   reports that he quit smoking about 53 years ago. His smoking use included cigarettes. He has never used smokeless tobacco.  Past Surgical History:  Procedure Laterality Date    BALLOON DILATION N/A 06/06/2018   Procedure: BALLOON DILATION;  Surgeon: Mansouraty, Albino Alu., MD;  Location: [facility] ENDOSCOPY;  Service: Gastroenterology;  Laterality: N/A;   BALLOON DILATION N/A 07/06/2018   Procedure: BALLOON DILATION;  Surgeon: Brice Campi Albino Alu., MD;  Location: [facility] ENDOSCOPY;  Service: Gastroenterology;  Laterality: N/A;   BALLOON DILATION N/A 07/24/2021   Procedure: BALLOON DILATION;  Surgeon: Brice Campi Albino Alu., MD;  Location: [facility] ENDOSCOPY;  Service: Gastroenterology;  Laterality: N/A;   BILIARY BRUSHING  06/05/2021   Procedure: BILIARY BRUSHING;  Surgeon: Yong Henle  Marieta Shorten., MD;  Location: [facility] ENDOSCOPY;  Service: Gastroenterology;;   BILIARY DILATION  06/05/2021   Procedure: BILIARY DILATION;  Surgeon: Normie Becton., MD;  Location: [facility] ENDOSCOPY;  Service: Gastroenterology;;   BILIARY DILATION  09/15/2021   Procedure: BILIARY DILATION;  Surgeon: Normie Becton., MD;  Location: [facility]OSCOPY;  Service: Gastroenterology;;   BILIARY STENT PLACEMENT  01/08/2018   Procedure: BILIARY STENT PLACEMENT;  Surgeon: Lajuan Pila, MD;  Location: [facility] ENDOSCOPY;  Service: Endoscopy;;   BILIARY STENT PLACEMENT N/A 02/09/2018   Procedure: BILIARY STENT PLACEMENT;  Surgeon: Normie Becton., MD;  Location: [facility] ENDOSCOPY;  Service: Gastroenterology;  Laterality: N/A;   BILIARY STENT PLACEMENT  04/01/2021   Procedure: BILIARY STENT PLACEMENT;  Surgeon: Lajuan Pila, MD;  Location: [facility] ENDOSCOPY;  Service: Endoscopy;;   BIOPSY  06/06/2018   Procedure: BIOPSY;  Surgeon: Normie Becton., MD;  Location: [facility] ENDOSCOPY;  Service: Gastroenterology;;   BIOPSY  04/01/2021   Procedure: BIOPSY;  Surgeon: Lajuan Pila, MD;  Location: [facility] ENDOSCOPY;  Service: Endoscopy;;   BIOPSY  06/05/2021   Procedure: BIOPSY;  Surgeon: Normie Becton., MD;  Location: [facility] ENDOSCOPY;  Service: Gastroenterology;;   BRONCHIAL BIOPSY  10/29/2022   Procedure: BRONCHIAL  BIOPSIES;  Surgeon: Gloriajean Large, MD;  Location: [facility] ENDOSCOPY;  Service: Pulmonary;;   BRONCHIAL WASHINGS  10/29/2022   Procedure: BRONCHIAL WASHINGS;  Surgeon: Gloriajean Large, MD;  Location: [facility] ENDOSCOPY;  Service: Pulmonary;;   BRONCHOSCOPY  09/2022   benign biopsies, negative for eosinophils, granuloma, fungal (Meier)   CARDIAC CATHETERIZATION N/A 04/30/2015   Procedure: Left Heart Cath and Coronary Angiography;  Surgeon: Millicent Ally, MD;  Location: [facility] INVASIVE CV LAB;  Service: Cardiovascular; LM nl, ost/p LAD 20%, patent mLAD stent, RI small, OM2 patent stent, pRCA 20%, patent stent, EF 45-50%-->Med Rx    cervical spine stimulator  08/2012   CHOLECYSTECTOMY  06/10/2015   Procedure: LAPAROSCOPIC CHOLECYSTECTOMY;  Surgeon: Alphonso Aschoff Kinsinger, MD;  Location: [facility] OR;  Service: General;;   COLONOSCOPY  09/2013   TA x2, rpt 5 yrs (Pyrtle)   COLONOSCOPY WITH ESOPHAGOGASTRODUODENOSCOPY (EGD)  06/2018   reactive gastropathy, tubular adenoma, rpt colonoscopy 5 yrs (Mansouraty)   COLONOSCOPY WITH PROPOFOL  N/A 06/06/2018   Procedure: COLONOSCOPY WITH PROPOFOL ;  Surgeon: Normie Becton., MD;  Location: [facility] ENDOSCOPY;  Service: Gastroenterology;  Laterality: N/A;   CORONARY ANGIOPLASTY WITH STENT PLACEMENT  1995, 2000, 2005   stents in RCA, LAD & Cx-OM. -Stents were patent by cardiac catheterization in November 2016   ENDOSCOPIC RETROGRADE CHOLANGIOPANCREATOGRAPHY (ERCP) WITH PROPOFOL  N/A 02/09/2018   Procedure: ENDOSCOPIC RETROGRADE CHOLANGIOPANCREATOGRAPHY (ERCP) WITH PROPOFOL ;  Surgeon: Brice Campi Albino Alu., MD;  Location: [facility] ENDOSCOPY;  Service: Gastroenterology;  Laterality: N/A;   ENDOSCOPIC RETROGRADE CHOLANGIOPANCREATOGRAPHY (ERCP) WITH PROPOFOL  N/A 03/30/2018   Procedure: ENDOSCOPIC RETROGRADE CHOLANGIOPANCREATOGRAPHY (ERCP) WITH PROPOFOL ;  Surgeon: Brice Campi Albino Alu., MD;  Location: [facility] ENDOSCOPY;  Service: Gastroenterology;  Laterality: N/A;   ENDOSCOPIC RETROGRADE  CHOLANGIOPANCREATOGRAPHY (ERCP) WITH PROPOFOL  N/A 06/05/2021   Procedure: ENDOSCOPIC RETROGRADE CHOLANGIOPANCREATOGRAPHY (ERCP) WITH PROPOFOL ;  Surgeon: Brice Campi Albino Alu., MD;  Location: [facility] ENDOSCOPY;  Service: Gastroenterology;  Laterality: N/A;   ENDOSCOPIC RETROGRADE CHOLANGIOPANCREATOGRAPHY (ERCP) WITH PROPOFOL  N/A 09/15/2021   Procedure: ENDOSCOPIC RETROGRADE CHOLANGIOPANCREATOGRAPHY (ERCP) WITH PROPOFOL ;  Surgeon: Brice Campi Albino Alu., MD;  Location: [facility] ENDOSCOPY;  Service: Gastroenterology;  Laterality: N/A;   ERCP N/A 01/08/2018   Procedure: ENDOSCOPIC RETROGRADE CHOLANGIOPANCREATOGRAPHY (ERCP);  Surgeon: Lajuan Pila, MD;  Location: [facility] ENDOSCOPY;  Service: Endoscopy;  Laterality: N/A;   ERCP N/A 04/01/2021   Procedure: ENDOSCOPIC RETROGRADE CHOLANGIOPANCREATOGRAPHY (ERCP);  Surgeon: Lajuan Pila, MD;  Location: [facility] ENDOSCOPY;  Service: Endoscopy;  Laterality: N/A;  Please schedule after 1 PM   ESOPHAGOGASTRODUODENOSCOPY N/A 03/30/2018   Procedure: ESOPHAGOGASTRODUODENOSCOPY (EGD);  Surgeon: Normie Becton., MD;  Location: [facility]OSCOPY;  Service: Gastroenterology;  Laterality: N/A;   ESOPHAGOGASTRODUODENOSCOPY (EGD) WITH PROPOFOL  N/A 06/06/2018   Procedure: ESOPHAGOGASTRODUODENOSCOPY (EGD) WITH PROPOFOL ;  Surgeon: Brice Campi Albino Alu., MD;  Location: [facility] ENDOSCOPY;  Service: Gastroenterology;  Laterality: N/A;   ESOPHAGOGASTRODUODENOSCOPY (EGD) WITH PROPOFOL  N/A 07/06/2018   Procedure: ESOPHAGOGASTRODUODENOSCOPY (EGD) WITH PROPOFOL ;  Surgeon: Brice Campi Albino Alu., MD;  Location: [facility] ENDOSCOPY;  Service: Gastroenterology;  Laterality: N/A;   ESOPHAGOGASTRODUODENOSCOPY (EGD) WITH PROPOFOL  N/A 07/24/2021   Procedure: ESOPHAGOGASTRODUODENOSCOPY (EGD) WITH PROPOFOL  WITH FLUOROSCOPY;  Surgeon: Mansouraty, Albino Alu., MD;  Location: [facility] ENDOSCOPY;  Service: Gastroenterology;  Laterality: N/A;   EUS N/A 07/24/2021   Procedure: UPPER ENDOSCOPIC ULTRASOUND (EUS) RADIAL;  Surgeon:  Normie Becton., MD;  Location: [facility] ENDOSCOPY;  Service: Gastroenterology;  Laterality: N/A;   lumbar spine stimulator     POLYPECTOMY  06/06/2018   Procedure: POLYPECTOMY;  Surgeon: Mansouraty, Albino Alu., MD;  Location: [facility] ENDOSCOPY;  Service: Gastroenterology;;   REMOVAL OF STONES  03/30/2018   Procedure: REMOVAL OF STONES;  Surgeon: Normie Becton., MD;  Location: [facility]OS[facility];  Service: Gastroenterology;;   REMOVAL OF STONES  06/05/2021   Procedure: REMOVAL OF STONES;  Surgeon: Normie Becton., MD;  Location: [facility] ENDOSCOPY;  Service: Gastroenterology;;   REMOVAL OF STONES  09/15/2021   Procedure: REMOVAL OF STONES;  Surgeon: Normie Becton., MD;  Location: [facility]OSCOPY;  Service: Gastroenterology;;   [address]  01/08/2018   Procedure: [address];  Surgeon: Lajuan Pila, MD;  Location: [facility] ENDOSCOPY;  Service: Endoscopy;;   SPHINCTEROTOMY  02/09/2018   Procedure: [address];  Surgeon: Mansouraty, Albino Alu., MD;  Location: [facility]OSCOPY;  Service: Gastroenterology;;   [facility] CHOLANGIOSCOPY N/A 06/05/2021   Procedure: ZOXWRUEA CHOLANGIOSCOPY;  Surgeon: Normie Becton., MD;  Location: [facility] ENDOSCOPY;  Service: Gastroenterology;  Laterality: N/A;   STENT REMOVAL  02/09/2018   Procedure: STENT REMOVAL;  Surgeon: Normie Becton., MD;  Location: [facility]OSCOPY;  Service: Gastroenterology;;   [place] REMOVAL  03/30/2018   Procedure: STENT REMOVAL;  Surgeon: Normie Becton., MD;  Location: [facility] ENDOSCOPY;  Service: Gastroenterology;;   [place] REMOVAL  06/05/2021   Procedure: STENT REMOVAL;  Surgeon: Normie Becton., MD;  Location: [facility] ENDOSCOPY;  Service: Gastroenterology;;   TRANSTHORACIC ECHOCARDIOGRAM  04/2015   EF 40-45%, mod antsept, ant, antlat, apical HK, mild AI/MR, PASP .   TRANSTHORACIC ECHOCARDIOGRAM  08/23/2020   EF 55 to 60%.  No RWMA.  Mild LVH.  GR 1 DD.  Normal RV size and function with normal RVP.  Normal  mitral and aortic valve.   UVULOPALATOPHARYNGOPLASTY, TONSILLECTOMY AND SEPTOPLASTY  2002   VIDEO BRONCHOSCOPY N/A 10/29/2022   Procedure: VIDEO BRONCHOSCOPY WITH FLUORO;  Surgeon: Gloriajean Large, MD;  Location: [facility] ENDOSCOPY;  Service: Pulmonary;  Laterality: N/A;    No Known Allergies  Immunization History  Administered Date(s) Administered   Fluad Quad(high Dose 65+) 01/20/2022   Influenza, High Dose Seasonal PF 03/04/2016, 02/21/2018, 02/23/2019, 03/11/2021, 02/03/2023   Influenza, Seasonal, Injecte, Preservative Fre 05/03/2012, 05/05/2014, 02/28/2015   Influenza,inj,Quad PF,6+ Mos 02/27/2013   Influenza-Unspecified 03/17/2017   PFIZER Comirnaty(Gray Top)Covid-19 Tri-Sucrose Vaccine 03/23/2020, 09/28/2020   PFIZER(Purple Top)SARS-COV-2 Vaccination 08/25/2019, 09/19/2019  Pfizer(Comirnaty)Fall Seasonal Vaccine 12 years and older 02/03/2023   Pneumococcal Conjugate-13 04/28/2015   Pneumococcal Polysaccharide-23 10/30/2016   Respiratory Syncytial Virus Vaccine,Recomb Aduvanted(Arexvy) 01/20/2022   Td 05/03/2012   Zoster Recombinant(Shingrix) 01/20/2022, 05/01/2022   Zoster, Live 06/29/2013    Family History  Problem Relation Age of Onset   Diabetes Mother    Heart disease Mother    Cancer Sister 23       male cancer   Breast cancer Brother 47   Hypertension Maternal Aunt    Cancer Maternal Uncle        spinal   Coronary artery disease Neg Hx    Stroke Neg Hx    Colon cancer Neg Hx    Pancreatic cancer Neg Hx    Stomach cancer Neg Hx    Esophageal cancer Neg Hx    Inflammatory bowel disease Neg Hx    Liver disease Neg Hx    Rectal cancer Neg Hx      Current Outpatient Medications:    acetaminophen  (TYLENOL ) 500 MG tablet, Take 1,000 mg by mouth every 6 (six) hours as needed for moderate pain or headache. , Disp: , Rfl:    Ascorbic Acid (VITAMIN C ) 1000 MG tablet, Take 1,000 mg by mouth daily., Disp: , Rfl:    Carboxymethylcellulose Sodium (LUBRICANT EYE DROPS  OP), Place 2 drops into both eyes daily as needed (for dry eyes)., Disp: , Rfl:    Cholecalciferol  (VITAMIN D3) 50 MCG (2000 UT) TABS, Take 2,000 Units by mouth daily., Disp: , Rfl:    gabapentin  (NEURONTIN ) 600 MG tablet, TAKE 2 TABLETS BY MOUTH 3 TIMES  DAILY, Disp: 600 tablet, Rfl: 3   ibuprofen  (ADVIL ) 200 MG tablet, Take 400 mg by mouth every 6 (six) hours as needed for headache or moderate pain., Disp: , Rfl:    Magnesium  250 MG TABS, Take 1 tablet (250 mg total) by mouth at bedtime. (Patient taking differently: Take 250 mg by mouth at bedtime.), Disp: , Rfl: 0   mirabegron  ER (MYRBETRIQ ) 25 MG TB24 tablet, TAKE 1 TABLET BY MOUTH DAILY, Disp: 100 tablet, Rfl: 0   mycophenolate  (CELLCEPT ) 500 MG tablet, Take 3 tablets (1,500 mg total) by mouth 2 (two) times daily., Disp: 540 tablet, Rfl: 1   Nintedanib (OFEV ) 150 MG CAPS, Take 1 capsule (150 mg total) by mouth 2 (two) times daily., Disp: 180 capsule, Rfl: 1   omeprazole  (PRILOSEC) 40 MG capsule, Take 1 capsule (40 mg total) by mouth 2 (two) times daily before a meal., Disp: 180 capsule, Rfl: 4   predniSONE  (DELTASONE ) 10 MG tablet, TAKE 1 TABLET BY MOUTH DAILY  WITH BREAKFAST, Disp: 100 tablet, Rfl: 1   rosuvastatin  (CRESTOR ) 20 MG tablet, Take 1 tablet (20 mg total) by mouth daily., Disp: 90 tablet, Rfl: 3   sulfamethoxazole -trimethoprim  (BACTRIM  DS) 800-160 MG tablet, TAKE 1 TABLET BY MOUTH 3 TIMES  WEEKLY, Disp: 43 tablet, Rfl: 2   tamsulosin  (FLOMAX ) 0.4 MG CAPS capsule, TAKE 1 CAPSULE BY MOUTH EVERY DAY, Disp: 90 capsule, Rfl: 3   vitamin B-12 (CYANOCOBALAMIN) 1000 MCG tablet, Take 1,000 mcg by mouth daily., Disp: , Rfl:    vitamin E 400 UNIT capsule, Take 400 Units by mouth daily., Disp: , Rfl:       Objective:   There were no vitals filed for this visit.  Estimated body mass index is 28.98 kg/m as calculated from the following:   Height as of 09/20/23: 5\' 10"  (1.778 m).   Weight as  of 09/20/23: 202 lb (91.6  kg).  @WEIGHTCHANGE @  There were no vitals filed for this visit.   Physical Exam   General: No distress. *** O2 at rest: *** Cane present: *** Sitting in wheel chair: *** Frail: *** Obese: *** Neuro: Alert and Oriented x 3. GCS 15. Speech normal Psych: Pleasant Resp:  Barrel Chest - ***.  Wheeze - ***, Crackles - ***, No overt respiratory distress CVS: Normal heart sounds. Murmurs - *** Ext: Stigmata of Connective Tissue Disease - *** HEENT: Normal upper airway. PEERL +. No post nasal drip        Assessment:     No diagnosis found.     Plan:     Patient Instructions     ICD-10-CM   1. ILD (interstitial lung disease) (HCC)  J84.9     2. Hypersensitivity pneumonitis (HCC)  J67.9     3. Long-term use of high-risk medication  Z79.899     4. Need for pneumocystis prophylaxis  Z29.89     5. Encounter for medication management  Z79.899     6. Chronic cough  R05.3     7. Dyspnea on exertion  R06.09     8. Exercise hypoxemia  R09.02      #ILD - need to rule out worsening  #chronic cough -this might be reflective of your interstitial lung disease because it seems to get worse when you drop your oxygen  and so for sinus scan and allergy blood work are negative   Plan - continue prednisone  at 10 mg/day [do not taper below for the moment] -Continue CellCept  1500 mg twice daily -Start antifibrotic nintedanib per protocol 150 mg twice a day  -Extensively counseled on side effect profile -Check CBC with diff, chemistry and liver function test and CK 08/19/2023 --Continue oxygen  2 L nasal cannula with exertion and at night  -Be more diligent about wearing her oxygen  because if your oxygen  does not drop your cough might be better -Do first available spirometry and I will see you  - Do high-resolution CT chest in the next few weeks  Follow-up -3 weeks video visit Dr. Bertrum Brodie to discuss CT scan results  -Video visit 15-minute fine -6 weeks face-to-face visit with  Dr. Bertrum Brodie but after breathing test  -Definitely 30-minute visit   FOLLOWUP No follow-ups on file.    SIGNATURE    Dr. Maire Scot, M.D., F.C.C.P,  Pulmonary and Critical Care Medicine Staff Physician, [facility] Director - Interstitial Lung Disease  Program  Pulmonary Fibrosis [facility] at [facility] [address] [place], [place], [address]  Pager: [phone], If no answer or between  15:00h - 7:00h: call [phone] Telephone: [phone]  6:42 PM 10/28/2023   Moderate Complexity MDM OFFICE  2021 E/M guidelines, first released in 2021, with minor revisions added in 2023 and 2024 Must meet the requirements for 2 out of 3 dimensions to qualify.    Number and complexity of problems addressed Amount and/or complexity of data reviewed Risk of complications and/or morbidity  One or more chronic illness with mild exacerbation, OR progression, OR  side effects of treatment  Two or more stable chronic illnesses  One undiagnosed new problem with uncertain prognosis  One acute illness with systemic symptoms   One Acute complicated injury Must meet the requirements for 1 of 3 of the categories)  Category 1: Tests and documents, historian  Any combination of 3 of the following:  Assessment  requiring an independent historian  Review of prior external note(s) from each unique source  Review of results of each unique test  Ordering of each unique test    Category 2: Interpretation of tests   Independent interpretation of a test performed by another physician/other qualified health care professional (not separately reported)  Category 3: Discuss management/tests  Discussion of management or test interpretation with external physician/other qualified health care professional/appropriate source (not separately reported) Moderate risk of morbidity from additional diagnostic testing or treatment Examples only:  Prescription  drug management  Decision regarding minor surgery with identfied patient or procedure risk factors  Decision regarding elective major surgery without identified patient or procedure risk factors  Diagnosis or treatment significantly limited by social determinants of health             HIGh Complexity  OFFICE   2021 E/M guidelines, first released in 2021, with minor revisions added in 2023. Must meet the requirements for 2 out of 3 dimensions to qualify.    Number and complexity of problems addressed Amount and/or complexity of data reviewed Risk of complications and/or morbidity  Severe exacerbation of chronic illness  Acute or chronic illnesses that may pose a threat to life or bodily function, e.g., multiple trauma, acute MI, pulmonary embolus, severe respiratory distress, progressive rheumatoid arthritis, psychiatric illness with potential threat to self or others, peritonitis, acute renal failure, abrupt change in neurological status Must meet the requirements for 2 of 3 of the categories)  Category 1: Tests and documents, historian  Any combination of 3 of the following:  Assessment requiring an independent historian  Review of prior external note(s) from each unique source  Review of results of each unique test  Ordering of each unique test    Category 2: Interpretation of tests    Independent interpretation of a test performed by another physician/other qualified health care professional (not separately reported)  Category 3: Discuss management/tests  Discussion of management or test interpretation with external physician/other qualified health care professional/appropriate source (not separately reported)  HIGH risk of morbidity from additional diagnostic testing or treatment Examples only:  Drug therapy requiring intensive monitoring for toxicity  Decision for elective major surgery with identified pateint or procedure risk factors  Decision regarding  hospitalization or escalation of level of care  Decision for DNR or to de-escalate care   Parenteral controlled  substances            LEGEND - Independent interpretation involves the interpretation of a test for which there is a CPT code, and an interpretation or report is customary. When a review and interpretation of a test is performed and documented by the provider, but not separately reported (billed), then this would represent an independent interpretation. This report does not need to conform to the usual standards of a complete report of the test. This does not include interpretation of tests that do not have formal reports such as a complete blood count with differential and blood cultures. Examples would include reviewing a chest radiograph and documenting in the medical record an interpretation, but not separately reporting (billing) the interpretation of the chest radiograph.   An appropriate source includes professionals who are not health care professionals but may be involved in the management of the patient, such as a Clinical research associate, upper officer, case manager or teacher, and does not include discussion with family or informal caregivers.    - SDOH: SDOH are the conditions in the environments where people are born, live,  learn, work, play, worship, and age that affect a wide range of health, functioning, and quality-of-life outcomes and risks. (e.g., housing, food insecurity, transportation, etc.). SDOH-related Z codes ranging from Z55-Z65 are the ICD-10-CM diagnosis codes used to document SDOH data Z55 - Problems related to education and literacy Z56 - Problems related to employment and unemployment Z57 - Occupational exposure to risk factors Z58 - Problems related to physical environment Z59 - Problems related to housing and economic circumstances [phone] - Problems related to social environment [phone] - Problems related to upbringing [phone] - Other problems related to primary  support group, including family circumstances Z32 - Problems related to certain psychosocial circumstances Z65 - Problems related to other psychosocial circumstances

## 2023-10-28 NOTE — Patient Instructions (Signed)
 ICD-10-CM   1. ILD (interstitial lung disease) (HCC)  J84.9     2. Hypersensitivity pneumonitis (HCC)  J67.9     3. Long-term use of high-risk medication  Z79.899     4. Need for pneumocystis prophylaxis  Z29.89     5. Encounter for medication management  Z79.899     6. Chronic cough  R05.3     7. Dyspnea on exertion  R06.09     8. Exercise hypoxemia  R09.02      #ILD - need to rule out worsening  #chronic cough -this might be reflective of your interstitial lung disease because it seems to get worse when you drop your oxygen  and so for sinus scan and allergy blood work are negative   Plan - continue prednisone  at 10 mg/day [do not taper below for the moment] -Continue CellCept  1500 mg twice daily -Start antifibrotic nintedanib per protocol 150 mg twice a day  -Extensively counseled on side effect profile -Check CBC with diff, chemistry and liver function test and CK 08/19/2023 --Continue oxygen  2 L nasal cannula with exertion and at night  -Be more diligent about wearing her oxygen  because if your oxygen  does not drop your cough might be better -Do first available spirometry and I will see you  - Do high-resolution CT chest in the next few weeks  Follow-up -3 weeks video visit Dr. Bertrum Brodie to discuss CT scan results  -Video visit 15-minute fine -6 weeks face-to-face visit with Dr. Bertrum Brodie but after breathing test  -Definitely 30-minute visit

## 2023-10-28 NOTE — Telephone Encounter (Signed)
 Too soon. Rx sent 10/06/23, #100/0 refills to [facility].   Request denied.

## 2023-10-29 ENCOUNTER — Encounter: Payer: Self-pay | Admitting: Internal Medicine

## 2023-10-29 ENCOUNTER — Encounter: Payer: Self-pay | Admitting: Gastroenterology

## 2023-10-29 ENCOUNTER — Ambulatory Visit: Admitting: Internal Medicine

## 2023-10-29 VITALS — BP 115/77 | HR 65 | Ht 70.0 in | Wt 201.0 lb

## 2023-10-29 DIAGNOSIS — J849 Interstitial pulmonary disease, unspecified: Secondary | ICD-10-CM

## 2023-10-29 DIAGNOSIS — Z79899 Other long term (current) drug therapy: Secondary | ICD-10-CM

## 2023-10-29 DIAGNOSIS — J679 Hypersensitivity pneumonitis due to unspecified organic dust: Secondary | ICD-10-CM | POA: Diagnosis not present

## 2023-10-29 DIAGNOSIS — R053 Chronic cough: Secondary | ICD-10-CM | POA: Diagnosis not present

## 2023-10-29 DIAGNOSIS — R0902 Hypoxemia: Secondary | ICD-10-CM

## 2023-10-29 DIAGNOSIS — K521 Toxic gastroenteritis and colitis: Secondary | ICD-10-CM | POA: Diagnosis not present

## 2023-10-29 DIAGNOSIS — Z2989 Encounter for other specified prophylactic measures: Secondary | ICD-10-CM

## 2023-10-29 DIAGNOSIS — R0609 Other forms of dyspnea: Secondary | ICD-10-CM | POA: Diagnosis not present

## 2023-10-29 DIAGNOSIS — F17210 Nicotine dependence, cigarettes, uncomplicated: Secondary | ICD-10-CM

## 2023-10-29 LAB — COMPREHENSIVE METABOLIC PANEL WITH GFR
ALT: 19 U/L (ref 0–53)
AST: 15 U/L (ref 0–37)
Albumin: 4.5 g/dL (ref 3.5–5.2)
Alkaline Phosphatase: 41 U/L (ref 39–117)
BUN: 18 mg/dL (ref 6–23)
CO2: 22 meq/L (ref 19–32)
Calcium: 9.2 mg/dL (ref 8.4–10.5)
Chloride: 105 meq/L (ref 96–112)
Creatinine, Ser: 0.57 mg/dL (ref 0.40–1.50)
GFR: 97.16 mL/min (ref >60.00)
Glucose, Bld: 92 mg/dL (ref 70–99)
Potassium: 3.6 meq/L (ref 3.5–5.1)
Sodium: 139 meq/L (ref 135–145)
Total Bilirubin: 1.1 mg/dL (ref 0.2–1.2)
Total Protein: 7.5 g/dL (ref 6.0–8.3)

## 2023-10-29 LAB — CBC WITH DIFFERENTIAL/PLATELET
Basophils Absolute: 0 10*3/uL (ref 0.0–0.1)
Basophils Relative: 0.5 % (ref 0.0–3.0)
Eosinophils Absolute: 0.2 10*3/uL (ref 0.0–0.7)
Eosinophils Relative: 1.8 % (ref 0.0–5.0)
HCT: 44 % (ref 39.0–52.0)
Hemoglobin: 14.8 g/dL (ref 13.0–17.0)
Lymphocytes Relative: 30.2 % (ref 12.0–46.0)
Lymphs Abs: 2.6 10*3/uL (ref 0.7–4.0)
MCHC: 33.7 g/dL (ref 30.0–36.0)
MCV: 99.8 fl (ref 78.0–100.0)
Monocytes Absolute: 0.7 10*3/uL (ref 0.1–1.0)
Monocytes Relative: 8.3 % (ref 3.0–12.0)
Neutro Abs: 5.1 10*3/uL (ref 1.4–7.7)
Neutrophils Relative %: 59.2 % (ref 43.0–77.0)
Platelets: 218 10*3/uL (ref 150.0–400.0)
RBC: 4.41 Mil/uL (ref 4.22–5.81)
RDW: 13.6 % (ref 11.5–15.5)
WBC: 8.7 10*3/uL (ref 4.0–10.5)

## 2023-10-29 LAB — HEPATIC FUNCTION PANEL
ALT: 19 U/L (ref 0–53)
AST: 15 U/L (ref 0–37)
Albumin: 4.5 g/dL (ref 3.5–5.2)
Alkaline Phosphatase: 41 U/L (ref 39–117)
Bilirubin, Direct: 0.2 mg/dL (ref 0.0–0.3)
Total Bilirubin: 1.1 mg/dL (ref 0.2–1.2)
Total Protein: 7.5 g/dL (ref 6.0–8.3)

## 2023-11-04 ENCOUNTER — Telehealth: Payer: Self-pay | Admitting: Internal Medicine

## 2023-11-04 NOTE — Telephone Encounter (Signed)
 Alexander [name]    Alexander [name] is yoru patient. He has progressive ILD M arch 2025 BNP normal ut ECHO Nov 2024 was concenring for INcresed PA presssures. CT shows enlarted PA trunk. Withtyvaso as a therapy approved for GROUP 3 PAH, are you able to consider RHC for him. I can let hkn now. Also, if so can you do it next few weeks?  Thanks    SIGNATURE    Dr. Maire Scot, M.D., F.C.C.P,  Pulmonary and Critical Care Medicine Staff Physician, [facility] Director - Interstitial Lung Disease  Program  Pulmonary Fibrosis [facility] at [facility] [place], [place], [address]   Pager: [phone], If no answer  -> Check AMION or Try [phone] Telephone (clinical office): [phone] Telephone (research): [phone]  8:59 AM 11/04/2023

## 2023-11-04 NOTE — Telephone Encounter (Signed)
 Sure - I can do a RHC on him -- have  Cath Days Mon & Tues next week @ [facility] & Thurs @ [facility].    What do I need to do -- to approval?   I can check with the CHF guys to see if they have a protocol. (Added Dalton to thread).  Give me a call  11-12 is tomorrow AM (6/6) - we can get it done.  DH

## 2023-11-05 NOTE — Telephone Encounter (Signed)
 Alexander [name]/Alexander [name]  Thank you.   1) Please note again this is standard of care requiest. It is NOT a research rqqquest 2)  I will call patient today and inform him  THanks you  MR

## 2023-11-08 ENCOUNTER — Other Ambulatory Visit: Payer: Self-pay | Admitting: Family Medicine

## 2023-11-08 DIAGNOSIS — N401 Enlarged prostate with lower urinary tract symptoms: Secondary | ICD-10-CM

## 2023-11-08 DIAGNOSIS — E785 Hyperlipidemia, unspecified: Secondary | ICD-10-CM

## 2023-11-08 DIAGNOSIS — E559 Vitamin D deficiency, unspecified: Secondary | ICD-10-CM

## 2023-11-08 DIAGNOSIS — R7303 Prediabetes: Secondary | ICD-10-CM

## 2023-11-09 ENCOUNTER — Telehealth: Payer: Self-pay | Admitting: *Deleted

## 2023-11-09 DIAGNOSIS — Z01812 Encounter for preprocedural laboratory examination: Secondary | ICD-10-CM

## 2023-11-09 NOTE — Telephone Encounter (Signed)
 Called and spoke with the patient's wife.  She confirmed that she spoke with Dina Francisco in our office and have an appoint scheduled with a PA to get information regarding the heart cath procedure as well getting the EKG and the blood work done. Patient's wife also confirmed that they are aware of the cath procedure that is scheduled for Thursday, 11/11/23 at 0830 show up time and 0930 procedure time.

## 2023-11-09 NOTE — Telephone Encounter (Signed)
 Spoke with patients wife and we were able to get them scheduled to come in and see APP here in the office to review all information and get necessary testing as well. Both patient and spouse were agreeable with time for tomorrow. No further needs at this time.

## 2023-11-09 NOTE — Telephone Encounter (Signed)
 This RN called the patient's and wife's cell phone number, their home number, and son's phone number.  No answer on any of the calls.  Left voicemail on each phone for the patient to call back regarding Pre-op EKG scheduling and right hearth cath to be performed.  Patient has been scheduled for Right heart cath (per Dr. Addie Holstein) at [facility] on 11/11/2023 at 0930.  Pre[employer]/Auth has been sent a message regarding the schedule.  Patient is not on any diabetic, antiplatelet, or anticoagulant medications and does not have any allergies to contrast listed.  Patient had labs done on 10/29/2023.

## 2023-11-09 NOTE — Telephone Encounter (Signed)
 Left voicemail message to call back for needed appointment before his scheduled cath later this week.

## 2023-11-10 ENCOUNTER — Encounter: Payer: Self-pay | Admitting: Nurse Practitioner

## 2023-11-10 ENCOUNTER — Other Ambulatory Visit: Payer: Self-pay | Admitting: Nurse Practitioner

## 2023-11-10 ENCOUNTER — Ambulatory Visit: Attending: Nurse Practitioner | Admitting: Nurse Practitioner

## 2023-11-10 ENCOUNTER — Other Ambulatory Visit (INDEPENDENT_AMBULATORY_CARE_PROVIDER_SITE_OTHER): Payer: Medicare Other

## 2023-11-10 VITALS — BP 114/70 | HR 85

## 2023-11-10 DIAGNOSIS — I10 Essential (primary) hypertension: Secondary | ICD-10-CM | POA: Diagnosis not present

## 2023-11-10 DIAGNOSIS — Z9861 Coronary angioplasty status: Secondary | ICD-10-CM | POA: Diagnosis not present

## 2023-11-10 DIAGNOSIS — J849 Interstitial pulmonary disease, unspecified: Secondary | ICD-10-CM

## 2023-11-10 DIAGNOSIS — I272 Pulmonary hypertension, unspecified: Secondary | ICD-10-CM

## 2023-11-10 DIAGNOSIS — N401 Enlarged prostate with lower urinary tract symptoms: Secondary | ICD-10-CM

## 2023-11-10 DIAGNOSIS — I2510 Atherosclerotic heart disease of native coronary artery without angina pectoris: Secondary | ICD-10-CM

## 2023-11-10 DIAGNOSIS — E785 Hyperlipidemia, unspecified: Secondary | ICD-10-CM | POA: Diagnosis not present

## 2023-11-10 DIAGNOSIS — I480 Paroxysmal atrial fibrillation: Secondary | ICD-10-CM

## 2023-11-10 DIAGNOSIS — E559 Vitamin D deficiency, unspecified: Secondary | ICD-10-CM

## 2023-11-10 DIAGNOSIS — R7303 Prediabetes: Secondary | ICD-10-CM | POA: Diagnosis not present

## 2023-11-10 DIAGNOSIS — R3912 Poor urinary stream: Secondary | ICD-10-CM | POA: Diagnosis not present

## 2023-11-10 MED ORDER — ASPIRIN 81 MG PO TBEC: 81.0000 mg | DELAYED_RELEASE_TABLET | Freq: Every day | ORAL | Status: AC

## 2023-11-10 NOTE — H&P (View-Only) (Signed)
 Cardiology Clinic Note   Patient Name: Alexander [name] Date of Encounter: 11/10/2023  Primary Care Provider:  Claire Crick, MD Primary Cardiologist:  Randene Bustard, MD  Patient Profile    73 y.o. male with a history of CAD, paroxysmal atrial fibrillation, ischemic cardiomyopathy, chronic heart failure with improved ejection fraction, hyperlipidemia, pulmonary fibrosis, sleep apnea, GERD, bile duct obstruction, polio, and chronic postural dizziness, who presents for follow-up pending right heart cardiac catheterization at the recommendation of pulmonology.  Past Medical History    Past Medical History:  Diagnosis Date   Acute cholecystitis 03/2016   with sepsis   Arthritis    CAD S/P percutaneous coronary angioplasty 1995, 2000, 2005   a. 1995 s/p BMS;  b. 2000 s/p BMS;  c. 2005 s/p stent - All stents in [facility] (RCA, LAD & OM - unknown on which date);  d. 04/2015 NSTEMI/Cath: LM nl, ost/p LAD 20%, patent mLAD stent, RI small, OM2 patent stent, pRCA 20%, patent stent, EF 45-50%-->Med Rx.   Calculus of bile duct with acute cholangitis with obstruction    Chronic pain syndrome    a. Followed @ [facility];  b. Uses 12-14 excedrins per day.   Complication of anesthesia 04/01/2021   agiated- battling nurses   Duodenal stricture    Essential hypertension    Fatty liver    Gallstones    GERD (gastroesophageal reflux disease)    Heart failure with improved ejection fraction (HFimpEF) (HCC)    a. 04/2015 Echo: EF 40-45%; b. 04/2023 Echo: EF of 60 to 65% with grade 1 diastolic dysfunction, mildly reduced RV function, RVSP of 43.2 mmHg, mild to moderate AI, and aortic sclerosis without stenosis.   History of depression    History of post poliomyelitis muscular atrophy    HLD (hyperlipidemia)    Ischemic cardiomyopathy    a. 04/2015 Echo: EF 40-45%, mod antsept, ant, antlat, apical HK, mild AI/MR, PASP ; b. 04/2023 Echo: EF of 60 to 65% with grade 1 diastolic  dysfunction, mildly reduced RV function, RVSP of 43.2 mmHg, mild to moderate AI, and aortic sclerosis without stenosis   Migraines    Muscle weakness following poliomyelitis    a. ambulates with braces.   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 04/2015 in setting of cholecystits and sepsis -->Amio;  b. CHA2DS2VASc = 4--> Eliquis .   Pneumonia 04/2015   Polio    Pulmonary hypertension (HCC)    a. 04/2023 Echo: RVSP 43.2 mmHg.   Septic shock (HCC) 01/07/2018   Shoulder pain, right    Past Surgical History:  Procedure Laterality Date   BALLOON DILATION N/A 06/06/2018   Procedure: BALLOON DILATION;  Surgeon: Brice Campi Albino Alu., MD;  Location: [facility] ENDOSCOPY;  Service: Gastroenterology;  Laterality: N/A;   BALLOON DILATION N/A 07/06/2018   Procedure: BALLOON DILATION;  Surgeon: Brice Campi Albino Alu., MD;  Location: [facility] ENDOSCOPY;  Service: Gastroenterology;  Laterality: N/A;   BALLOON DILATION N/A 07/24/2021   Procedure: BALLOON DILATION;  Surgeon: Brice Campi Albino Alu., MD;  Location: [facility] ENDOSCOPY;  Service: Gastroenterology;  Laterality: N/A;   BILIARY BRUSHING  06/05/2021   Procedure: BILIARY BRUSHING;  Surgeon: Brice Campi Albino Alu., MD;  Location: [facility] ENDOSCOPY;  Service: Gastroenterology;;   BILIARY DILATION  06/05/2021   Procedure: BILIARY DILATION;  Surgeon: Brice Campi Albino Alu., MD;  Location: [facility] ENDOSCOPY;  Service: Gastroenterology;;   BILIARY DILATION  09/15/2021   Procedure: BILIARY DILATION;  Surgeon: Normie Becton., MD;  Location: [facility] ENDOSCOPY;  Service: Gastroenterology;;   BILIARY STENT PLACEMENT  01/08/2018   Procedure: BILIARY STENT PLACEMENT;  Surgeon: Lajuan Pila, MD;  Location: [facility] ENDOSCOPY;  Service: Endoscopy;;   BILIARY STENT PLACEMENT N/A 02/09/2018   Procedure: BILIARY  STENT PLACEMENT;  Surgeon: Normie Becton., MD;  Location: [facility] ENDOSCOPY;  Service: Gastroenterology;  Laterality: N/A;   BILIARY STENT PLACEMENT  04/01/2021   Procedure: BILIARY STENT PLACEMENT;  Surgeon: Lajuan Pila, MD;  Location: [facility] ENDOSCOPY;  Service: Endoscopy;;   BIOPSY  06/06/2018   Procedure: BIOPSY;  Surgeon: Normie Becton., MD;  Location: [facility] ENDOSCOPY;  Service: Gastroenterology;;   BIOPSY  04/01/2021   Procedure: BIOPSY;  Surgeon: Lajuan Pila, MD;  Location: [facility] ENDOSCOPY;  Service: Endoscopy;;   BIOPSY  06/05/2021   Procedure: BIOPSY;  Surgeon: Normie Becton., MD;  Location: [facility] ENDOSCOPY;  Service: Gastroenterology;;   BRONCHIAL BIOPSY  10/29/2022   Procedure: BRONCHIAL BIOPSIES;  Surgeon: Gloriajean Large, MD;  Location: [facility] ENDOSCOPY;  Service: Pulmonary;;   BRONCHIAL WASHINGS  10/29/2022   Procedure: BRONCHIAL WASHINGS;  Surgeon: Gloriajean Large, MD;  Location: [facility] ENDOSCOPY;  Service: Pulmonary;;   BRONCHOSCOPY  09/2022   benign biopsies, negative for eosinophils, granuloma, fungal (Meier)   CARDIAC CATHETERIZATION N/A 04/30/2015   Procedure: Left Heart Cath and Coronary Angiography;  Surgeon: Millicent Ally, MD;  Location: [facility] INVASIVE CV LAB;  Service: Cardiovascular; LM nl, ost/p LAD 20%, patent mLAD stent, RI small, OM2 patent stent, pRCA 20%, patent stent, EF 45-50%-->Med Rx    cervical spine stimulator  08/2012   CHOLECYSTECTOMY  06/10/2015   Procedure: LAPAROSCOPIC CHOLECYSTECTOMY;  Surgeon: Alphonso Aschoff Kinsinger, MD;  Location: [facility] OR;  Service: General;;   COLONOSCOPY  09/2013   TA x2, rpt 5 yrs (Pyrtle)   COLONOSCOPY WITH ESOPHAGOGASTRODUODENOSCOPY (EGD)  06/2018   reactive gastropathy, tubular adenoma, rpt colonoscopy 5 yrs (Mansouraty)   COLONOSCOPY WITH PROPOFOL  N/A 06/06/2018   Procedure: COLONOSCOPY WITH PROPOFOL ;  Surgeon: Normie Becton., MD;  Location: [facility] ENDOSCOPY;  Service: Gastroenterology;  Laterality: N/A;   CORONARY  ANGIOPLASTY WITH STENT PLACEMENT  1995, 2000, 2005   stents in RCA, LAD & Cx-OM. -Stents were patent by cardiac catheterization in November 2016   ENDOSCOPIC RETROGRADE CHOLANGIOPANCREATOGRAPHY (ERCP) WITH PROPOFOL  N/A 02/09/2018   Procedure: ENDOSCOPIC RETROGRADE CHOLANGIOPANCREATOGRAPHY (ERCP) WITH PROPOFOL ;  Surgeon: Normie Becton., MD;  Location: [facility] ENDOSCOPY;  Service: Gastroenterology;  Laterality: N/A;   ENDOSCOPIC RETROGRADE CHOLANGIOPANCREATOGRAPHY (ERCP) WITH PROPOFOL  N/A 03/30/2018   Procedure: ENDOSCOPIC RETROGRADE CHOLANGIOPANCREATOGRAPHY (ERCP) WITH PROPOFOL ;  Surgeon: Brice Campi Albino Alu., MD;  Location: [facility] ENDOSCOPY;  Service: Gastroenterology;  Laterality: N/A;   ENDOSCOPIC RETROGRADE CHOLANGIOPANCREATOGRAPHY (ERCP) WITH PROPOFOL  N/A 06/05/2021   Procedure: ENDOSCOPIC RETROGRADE CHOLANGIOPANCREATOGRAPHY (ERCP) WITH PROPOFOL ;  Surgeon: Brice Campi Albino Alu., MD;  Location: [facility] ENDOSCOPY;  Service: Gastroenterology;  Laterality: N/A;   ENDOSCOPIC RETROGRADE CHOLANGIOPANCREATOGRAPHY (ERCP) WITH PROPOFOL  N/A 09/15/2021   Procedure: ENDOSCOPIC RETROGRADE CHOLANGIOPANCREATOGRAPHY (ERCP) WITH PROPOFOL ;  Surgeon: Brice Campi Albino Alu., MD;  Location: [facility] ENDOSCOPY;  Service: Gastroenterology;  Laterality: N/A;   ERCP N/A 01/08/2018   Procedure: ENDOSCOPIC RETROGRADE CHOLANGIOPANCREATOGRAPHY (ERCP);  Surgeon: Lajuan Pila, MD;  Location: [facility] ENDOSCOPY;  Service: Endoscopy;  Laterality: N/A;   ERCP N/A 04/01/2021   Procedure: ENDOSCOPIC RETROGRADE CHOLANGIOPANCREATOGRAPHY (  ERCP);  Surgeon: Lajuan Pila, MD;  Location: [facility] ENDOSCOPY;  Service: Endoscopy;  Laterality: N/A;  Please schedule after 1 PM   ESOPHAGOGASTRODUODENOSCOPY N/A 03/30/2018   Procedure: ESOPHAGOGASTRODUODENOSCOPY (EGD);  Surgeon: Normie Becton., MD;  Location: [facility] ENDOSCOPY;  Service: Gastroenterology;  Laterality: N/A;   ESOPHAGOGASTRODUODENOSCOPY (EGD) WITH PROPOFOL  N/A 06/06/2018   Procedure:  ESOPHAGOGASTRODUODENOSCOPY (EGD) WITH PROPOFOL ;  Surgeon: Brice Campi Albino Alu., MD;  Location: [facility] ENDOSCOPY;  Service: Gastroenterology;  Laterality: N/A;   ESOPHAGOGASTRODUODENOSCOPY (EGD) WITH PROPOFOL  N/A 07/06/2018   Procedure: ESOPHAGOGASTRODUODENOSCOPY (EGD) WITH PROPOFOL ;  Surgeon: Brice Campi Albino Alu., MD;  Location: [facility] ENDOSCOPY;  Service: Gastroenterology;  Laterality: N/A;   ESOPHAGOGASTRODUODENOSCOPY (EGD) WITH PROPOFOL  N/A 07/24/2021   Procedure: ESOPHAGOGASTRODUODENOSCOPY (EGD) WITH PROPOFOL  WITH FLUOROSCOPY;  Surgeon: Mansouraty, Albino Alu., MD;  Location: [facility] ENDOSCOPY;  Service: Gastroenterology;  Laterality: N/A;   EUS N/A 07/24/2021   Procedure: UPPER ENDOSCOPIC ULTRASOUND (EUS) RADIAL;  Surgeon: Normie Becton., MD;  Location: [facility] ENDOSCOPY;  Service: Gastroenterology;  Laterality: N/A;   lumbar spine stimulator     POLYPECTOMY  06/06/2018   Procedure: POLYPECTOMY;  Surgeon: Mansouraty, Albino Alu., MD;  Location: [facility] ENDOSCOPY;  Service: Gastroenterology;;   REMOVAL OF STONES  03/30/2018   Procedure: REMOVAL OF STONES;  Surgeon: Normie Becton., MD;  Location: [facility] ENDOSCOPY;  Service: Gastroenterology;;   REMOVAL OF STONES  06/05/2021   Procedure: REMOVAL OF STONES;  Surgeon: Normie Becton., MD;  Location: [facility] ENDOSCOPY;  Service: Gastroenterology;;   REMOVAL OF STONES  09/15/2021   Procedure: REMOVAL OF STONES;  Surgeon: Normie Becton., MD;  Location: [facility] ENDOSCOPY;  Service: Gastroenterology;;   [address]  01/08/2018   Procedure: [address];  Surgeon: Lajuan Pila, MD;  Location: [facility] ENDOSCOPY;  Service: Endoscopy;;   SPHINCTEROTOMY  02/09/2018   Procedure: [address];  Surgeon: Mansouraty, Albino Alu., MD;  Location: [facility] ENDOSCOPY;  Service: Gastroenterology;;   [facility] CHOLANGIOSCOPY N/A 06/05/2021   Procedure: XBJYNWGN CHOLANGIOSCOPY;  Surgeon: Normie Becton., MD;  Location: [facility] ENDOSCOPY;  Service:  Gastroenterology;  Laterality: N/A;   STENT REMOVAL  02/09/2018   Procedure: STENT REMOVAL;  Surgeon: Normie Becton., MD;  Location: [facility] ENDOSCOPY;  Service: Gastroenterology;;   [place] REMOVAL  03/30/2018   Procedure: STENT REMOVAL;  Surgeon: Normie Becton., MD;  Location: [facility] ENDOSCOPY;  Service: Gastroenterology;;   [place] REMOVAL  06/05/2021   Procedure: STENT REMOVAL;  Surgeon: Normie Becton., MD;  Location: [facility] ENDOSCOPY;  Service: Gastroenterology;;   TRANSTHORACIC ECHOCARDIOGRAM  04/2015   EF 40-45%, mod antsept, ant, antlat, apical HK, mild AI/MR, PASP .   TRANSTHORACIC ECHOCARDIOGRAM  08/23/2020   EF 55 to 60%.  No RWMA.  Mild LVH.  GR 1 DD.  Normal RV size and function with normal RVP.  Normal mitral and aortic valve.   UVULOPALATOPHARYNGOPLASTY, TONSILLECTOMY AND SEPTOPLASTY  2002   VIDEO BRONCHOSCOPY N/A 10/29/2022   Procedure: VIDEO BRONCHOSCOPY WITH FLUORO;  Surgeon: Gloriajean Large, MD;  Location: [facility] ENDOSCOPY;  Service: Pulmonary;  Laterality: N/A;    Allergies  No Known Allergies  History of Present Illness      73 y.o. y/o male with a history of CAD, paroxysmal atrial fibrillation, ischemic cardiomyopathy, chronic heart failure with improved ejection fraction, hyperlipidemia, pulmonary fibrosis, sleep apnea, polio, GERD, bile duct obstruction, and chronic postural dizziness.  He previously underwent stenting in 1995, 2000, and 2005, all in [place].  In November 2016, he was admitted with cholecystitis and complained of chest pain.  He  was also septic and hypotensive.  Due to high risk, he underwent percutaneous drain.  During admission he developed A-fib with RVR and had an elevated troponin.  Echo showed an EF of 40 to 45%.  Diagnostic catheterization revealed patent LAD, OM 2, and RCA stents.  He was initially placed on oral anticoagulation with Eliquis  but this has since been discontinued due to bleeding issues.   Mr. Wiegand was last  seen in cardiology clinic in October 2024 at which time he reported fatigue and his statin was cut in half.  He also reported dyspnea.  Echocardiogram was performed in November 2024 showing EF of 60 to 65% with grade 1 diastolic dysfunction, mildly reduced RV function, RVSP of 43.2 mmHg, mild to moderate AI, and aortic sclerosis without stenosis.  Mr. Stogsdill was recently seen by his pulmonologist in the setting of dyspnea, ongoing cough, and fatigue.  Interstitial lung disease has been managed with nintedanib and cellcept .  Recommendation was made for right heart diagnostic catheterization and he was scheduled for follow-up today.  Patient notes chronic dyspnea on exertion with minimal activity.  At rest, he does not usually wear his oxygen  and is able to saturate in the high 90s but simply standing up results and a drop in saturation into the 80s, or when wearing oxygen , into the low 90s.  Though he experiences significant dyspnea on exertion, he denies any chest pressure/tightness.  He occasionally notes a twinge of a sharp and shooting chest discomfort which he describes as an electrical current, lasting a second or so, and resolving spontaneously.  He denies palpitations, PND, orthopnea, dizziness, syncope, edema, or early satiety.  He is scheduled for right heart catheterization with Dr. Addie Holstein tomorrow. Objective   Home Medications    Current Outpatient Medications  Medication Sig Dispense Refill   acetaminophen  (TYLENOL ) 500 MG tablet Take 1,000 mg by mouth every 6 (six) hours as needed for moderate pain or headache.      Ascorbic Acid (VITAMIN C ) 1000 MG tablet Take 1,000 mg by mouth daily.     aspirin  EC 81 MG tablet Take 1 tablet (81 mg total) by mouth daily. Swallow whole.     Carboxymethylcellulose Sodium (LUBRICANT EYE DROPS OP) Place 2 drops into both eyes daily as needed (for dry eyes).     Cholecalciferol  (VITAMIN D3) 50 MCG (2000 UT) TABS Take 2,000 Units by mouth daily.      gabapentin  (NEURONTIN ) 600 MG tablet TAKE 2 TABLETS BY MOUTH 3 TIMES  DAILY 600 tablet 3   ibuprofen  (ADVIL ) 200 MG tablet Take 400 mg by mouth every 6 (six) hours as needed for headache or moderate pain.     Magnesium  250 MG TABS Take 1 tablet (250 mg total) by mouth at bedtime. (Patient taking differently: Take 250 mg by mouth at bedtime.)  0   mirabegron  ER (MYRBETRIQ ) 25 MG TB24 tablet TAKE 1 TABLET BY MOUTH DAILY 100 tablet 0   mycophenolate  (CELLCEPT ) 500 MG tablet Take 3 tablets (1,500 mg total) by mouth 2 (two) times daily. 540 tablet 1   Nintedanib (OFEV ) 150 MG CAPS Take 1 capsule (150 mg total) by mouth 2 (two) times daily. 180 capsule 1   omeprazole  (PRILOSEC) 40 MG capsule Take 1 capsule (40 mg total) by mouth 2 (two) times daily before a meal. 180 capsule 4   predniSONE  (DELTASONE ) 10 MG tablet TAKE 1 TABLET BY MOUTH DAILY  WITH BREAKFAST 100 tablet 1   rosuvastatin  (CRESTOR ) 20 MG tablet Take  1 tablet (20 mg total) by mouth daily. 90 tablet 3   sulfamethoxazole -trimethoprim  (BACTRIM  DS) 800-160 MG tablet TAKE 1 TABLET BY MOUTH 3 TIMES  WEEKLY 43 tablet 2   tamsulosin  (FLOMAX ) 0.4 MG CAPS capsule TAKE 1 CAPSULE BY MOUTH EVERY DAY 90 capsule 3   vitamin B-12 (CYANOCOBALAMIN) 1000 MCG tablet Take 1,000 mcg by mouth daily.     vitamin E 400 UNIT capsule Take 400 Units by mouth daily.     No current facility-administered medications for this visit.     Family History    Family History  Problem Relation Age of Onset   Diabetes Mother    Heart disease Mother    Cancer Sister 90       male cancer   Breast cancer Brother 84   Hypertension Maternal Aunt    Cancer Maternal Uncle        spinal   Coronary artery disease Neg Hx    Stroke Neg Hx    Colon cancer Neg Hx    Pancreatic cancer Neg Hx    Stomach cancer Neg Hx    Esophageal cancer Neg Hx    Inflammatory bowel disease Neg Hx    Liver disease Neg Hx    Rectal cancer Neg Hx    He indicated that his mother is deceased.  He indicated that his father is deceased. He indicated that his sister is deceased. He indicated that the status of his brother is unknown. He indicated that all of his three sons are alive. He indicated that the status of his maternal aunt is unknown. He indicated that his maternal uncle is alive. He indicated that the status of his neg hx is unknown.   Social History    Social History   Socioeconomic History   Marital status: Married    Spouse name: Robyn   Number of children: 3   Years of education: Not on file   Highest education level: Associate degree: occupational, Scientist, product/process development, or vocational program  Occupational History   Occupation: retired  Tobacco Use   Smoking status: Former    Current packs/day: 0.00    Types: Cigarettes    Quit date: 1972    Years since quitting: 53.4   Smokeless tobacco: Never   Tobacco comments:    was very light  Vaping Use   Vaping status: Never Used  Substance and Sexual Activity   Alcohol  use: Not Currently    Comment: ocassional -maybe 2 beers   Drug use: No   Sexual activity: Yes  Other Topics Concern   Not on file  Social History Narrative   Caffeine : 1 cup soda, occasional coffee   Lives with wife Corbin Dess) and 73 yo son, 4 dogs   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   Activity: no regular activity   Diet: overall healthy, good fruits and vegetables, good amt water    Social Drivers of Corporate investment banker Strain: Low Risk  (11/08/2023)   Overall Financial Resource Strain (CARDIA)    Difficulty of Paying Living Expenses: Not very hard  Food Insecurity: No Food Insecurity (11/08/2023)   Hunger Vital Sign    Worried About Running Out of Food in the Last Year: Never true    Ran Out of Food in the Last Year: Never true  Transportation Needs: No Transportation Needs (11/08/2023)   PRAPARE - Transportation    Lack of Transportation (Medical): No    Lack  of Transportation  (Non-Medical): No  Physical Activity: Insufficiently Active (11/08/2023)   Exercise Vital Sign    Days of Exercise per Week: 2 days    Minutes of Exercise per Session: 30 min  Stress: No Stress Concern Present (11/08/2023)   [employer] of Occupational Health - Occupational Stress Questionnaire    Feeling of Stress : Not at all  Social Connections: Moderately Integrated (11/08/2023)   Social Connection and Isolation Panel [NHANES]    Frequency of Communication with Friends and Family: More than three times a week    Frequency of Social Gatherings with Friends and Family: More than three times a week    Attends Religious Services: Never    Database administrator or Organizations: Yes    Attends Engineer, structural: More than 4 times per year    Marital Status: Married  Catering manager Violence: Not At Risk (11/10/2022)   Humiliation, Afraid, Rape, and Kick questionnaire    Fear of Current or Ex-Partner: No    Emotionally Abused: No    Physically Abused: No    Sexually Abused: No     Review of Systems    General:  No chills, fever, night sweats or weight changes.  Cardiovascular:  +++ Occasional, fleeting chest pain, which she describes as feeling like electrical current.  +++ dyspnea on exertion, no edema, orthopnea, palpitations, paroxysmal nocturnal dyspnea. Dermatological: No rash, lesions/masses Respiratory: +++ cough, +++ dyspnea Urologic: No hematuria, dysuria Abdominal:   No nausea, vomiting, diarrhea, bright red blood per rectum, melena, or hematemesis Neurologic:  No visual changes, wkns, changes in mental status. All other systems reviewed and are otherwise negative except as noted above.     Physical Exam    VS:  BP 114/70 (BP Location: Right Arm, Patient Position: Sitting, Cuff Size: Normal)   Pulse 85   SpO2 95%  , BMI There is no height or weight on file to calculate BMI.     GEN: Well nourished, well developed, in no acute distress. HEENT:  normal. Neck: Supple, no JVD, carotid bruits, or masses. Cardiac: RRR, no murmurs, rubs, or gallops. No clubbing, cyanosis, edema.  Radials 2+/PT 2+ and equal bilaterally.  Respiratory:  Respirations regular and unlabored, faint diffuse crackles posteriorly. GI: Soft, nontender, nondistended, BS + x 4. MS: no deformity or atrophy. Skin: warm and dry, no rash. Neuro:  Strength and sensation are intact. Psych: Normal affect.  Accessory Clinical Findings    ECG personally reviewed by me today- EKG Interpretation Date/Time:  Wednesday November 10 2023 09:39:23 EDT Ventricular Rate:  85 PR Interval:  168 QRS Duration:  100 QT Interval:  398 QTC Calculation: 473 R Axis:   2  Text Interpretation: Normal sinus rhythm Incomplete right bundle branch block Minimal voltage criteria for LVH, may be normal variant ( R in aVL ) ST & T wave abnormality, consider inferior ischemia ST & T wave abnormality, consider anterior ischemia Prolonged QT Confirmed by Laneta Pintos [phone]) on 11/10/2023 10:04:15 AM   - No acute changes  Lab Results  Component Value Date   WBC 8.7 10/29/2023   HGB 14.8 10/29/2023   HCT 44.0 10/29/2023   MCV 99.8 10/29/2023   PLT 218.0 10/29/2023   Lab Results  Component Value Date   CREATININE 0.57 10/29/2023   BUN 18 10/29/2023   NA 139 10/29/2023   K 3.6 10/29/2023   CL 105 10/29/2023   CO2 22 10/29/2023   Lab Results  Component Value Date  ALT 19 10/29/2023   ALT 19 10/29/2023   AST 15 10/29/2023   AST 15 10/29/2023   ALKPHOS 41 10/29/2023   ALKPHOS 41 10/29/2023   BILITOT 1.1 10/29/2023   BILITOT 1.1 10/29/2023   Lab Results  Component Value Date   CHOL 135 08/25/2023   HDL 68 08/25/2023   LDLCALC 47 08/25/2023   LDLDIRECT 213.1 06/29/2013   TRIG 116 08/25/2023   CHOLHDL 2.0 08/25/2023    Lab Results  Component Value Date   HGBA1C 5.9 11/04/2022      Assessment & Plan   1.  Pulmonary hypertension: In the context of chronic hypoxic  respiratory failure and interstitial lung disease, echocardiogram in November 2024 showed normal LV function, grade 1 diastolic dysfunction, and RVSP of 43.2 mmHg.  High resolution CT in April 2025 showed pulmonary parenchymal pattern of fibrosis with enlarged pulmonary trunk and cardiomegaly.  Patient has had progressive dyspnea with minimal activity and was recently seen by pulmonology with recommendation for right heart catheterization.  He is currently scheduled for right heart cath tomorrow, June 12, with Dr. Addie Holstein.  Patient had labs earlier today.  The patient understands that risks include but are not limited to bleeding or vascular complication (1 in 500), pneumothorax (1 in 1600), arrhythmia (1 in 1000), and death (1 in 5000), and agrees to proceed.    2.  Coronary artery disease: Status post prior stenting with most recent catheterization in 2016 showing patent LAD, OM 2, and RCA stents.  Despite progressive dyspnea, he does not experience angina.  He sometimes notes a fleeting chest pain which she describes as feeling like electrical current, which has been intermittently present for a long time.  He remains on statin therapy.  He used to take aspirin  81 mg daily but says he stopped it at some point along the way.  Encouraged him to resume given his history.  3.  Ischemic cardiomyopathy/chronic heart failure with improved ejection fraction: EF previously as low as 40 to 45% with most recent echo in November 2024 showing improvement to 60 to 65%.  He is euvolemic on examination and does not require diuretic therapy.  Heart rate and blood pressure stable.  4.  Primary hypertension: Blood pressure stable today in the absence of antihypertensives.  5.  Paroxysmal atrial fibrillation: This occurred in the setting of cholecystitis and sepsis in 2016 with no known recurrence.  He is no longer on anticoagulation due to isolated episode and history of bleeding issues.  He is not on any AV nodal blocking  agents and is unaware of any palpitations.  6.  Hyperlipidemia: On rosuvastatin  therapy with an LDL of 47 earlier this year.  7.  Interstitial lung disease: See #1.  Followed closely by pulmonology.  On CellCept , nintedanib, and daily prednisone .  8.  Disposition: Plan for right heart catheterization tomorrow.  Follow-up in clinic in 3 months or sooner if necessary.   Laneta Pintos, NP 11/10/2023, 12:19 PM

## 2023-11-10 NOTE — Progress Notes (Signed)
 Cardiology Clinic Note   Patient Name: Alexander [name] Date of Encounter: 11/10/2023  Primary Care Provider:  Claire Crick, MD Primary Cardiologist:  Randene Bustard, MD  Patient Profile    73 y.o. male with a history of CAD, paroxysmal atrial fibrillation, ischemic cardiomyopathy, chronic heart failure with improved ejection fraction, hyperlipidemia, pulmonary fibrosis, sleep apnea, GERD, bile duct obstruction, polio, and chronic postural dizziness, who presents for follow-up pending right heart cardiac catheterization at the recommendation of pulmonology.  Past Medical History    Past Medical History:  Diagnosis Date   Acute cholecystitis 03/2016   with sepsis   Arthritis    CAD S/P percutaneous coronary angioplasty 1995, 2000, 2005   a. 1995 s/p BMS;  b. 2000 s/p BMS;  c. 2005 s/p stent - All stents in [facility] (RCA, LAD & OM - unknown on which date);  d. 04/2015 NSTEMI/Cath: LM nl, ost/p LAD 20%, patent mLAD stent, RI small, OM2 patent stent, pRCA 20%, patent stent, EF 45-50%-->Med Rx.   Calculus of bile duct with acute cholangitis with obstruction    Chronic pain syndrome    a. Followed @ [facility];  b. Uses 12-14 excedrins per day.   Complication of anesthesia 04/01/2021   agiated- battling nurses   Duodenal stricture    Essential hypertension    Fatty liver    Gallstones    GERD (gastroesophageal reflux disease)    Heart failure with improved ejection fraction (HFimpEF) (HCC)    a. 04/2015 Echo: EF 40-45%; b. 04/2023 Echo: EF of 60 to 65% with grade 1 diastolic dysfunction, mildly reduced RV function, RVSP of 43.2 mmHg, mild to moderate AI, and aortic sclerosis without stenosis.   History of depression    History of post poliomyelitis muscular atrophy    HLD (hyperlipidemia)    Ischemic cardiomyopathy    a. 04/2015 Echo: EF 40-45%, mod antsept, ant, antlat, apical HK, mild AI/MR, PASP ; b. 04/2023 Echo: EF of 60 to 65% with grade 1 diastolic  dysfunction, mildly reduced RV function, RVSP of 43.2 mmHg, mild to moderate AI, and aortic sclerosis without stenosis   Migraines    Muscle weakness following poliomyelitis    a. ambulates with braces.   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 04/2015 in setting of cholecystits and sepsis -->Amio;  b. CHA2DS2VASc = 4--> Eliquis .   Pneumonia 04/2015   Polio    Pulmonary hypertension (HCC)    a. 04/2023 Echo: RVSP 43.2 mmHg.   Septic shock (HCC) 01/07/2018   Shoulder pain, right    Past Surgical History:  Procedure Laterality Date   BALLOON DILATION N/A 06/06/2018   Procedure: BALLOON DILATION;  Surgeon: Brice Campi Albino Alu., MD;  Location: [facility] ENDOSCOPY;  Service: Gastroenterology;  Laterality: N/A;   BALLOON DILATION N/A 07/06/2018   Procedure: BALLOON DILATION;  Surgeon: Brice Campi Albino Alu., MD;  Location: [facility] ENDOSCOPY;  Service: Gastroenterology;  Laterality: N/A;   BALLOON DILATION N/A 07/24/2021   Procedure: BALLOON DILATION;  Surgeon: Brice Campi Albino Alu., MD;  Location: [facility] ENDOSCOPY;  Service: Gastroenterology;  Laterality: N/A;   BILIARY BRUSHING  06/05/2021   Procedure: BILIARY BRUSHING;  Surgeon: Brice Campi Albino Alu., MD;  Location: [facility] ENDOSCOPY;  Service: Gastroenterology;;   BILIARY DILATION  06/05/2021   Procedure: BILIARY DILATION;  Surgeon: Brice Campi Albino Alu., MD;  Location: [facility] ENDOSCOPY;  Service: Gastroenterology;;   BILIARY DILATION  09/15/2021   Procedure: BILIARY DILATION;  Surgeon: Normie Becton., MD;  Location: [facility] ENDOSCOPY;  Service: Gastroenterology;;   BILIARY STENT PLACEMENT  01/08/2018   Procedure: BILIARY STENT PLACEMENT;  Surgeon: Lajuan Pila, MD;  Location: [facility] ENDOSCOPY;  Service: Endoscopy;;   BILIARY STENT PLACEMENT N/A 02/09/2018   Procedure: BILIARY  STENT PLACEMENT;  Surgeon: Normie Becton., MD;  Location: [facility] ENDOSCOPY;  Service: Gastroenterology;  Laterality: N/A;   BILIARY STENT PLACEMENT  04/01/2021   Procedure: BILIARY STENT PLACEMENT;  Surgeon: Lajuan Pila, MD;  Location: [facility] ENDOSCOPY;  Service: Endoscopy;;   BIOPSY  06/06/2018   Procedure: BIOPSY;  Surgeon: Normie Becton., MD;  Location: [facility] ENDOSCOPY;  Service: Gastroenterology;;   BIOPSY  04/01/2021   Procedure: BIOPSY;  Surgeon: Lajuan Pila, MD;  Location: [facility] ENDOSCOPY;  Service: Endoscopy;;   BIOPSY  06/05/2021   Procedure: BIOPSY;  Surgeon: Normie Becton., MD;  Location: [facility] ENDOSCOPY;  Service: Gastroenterology;;   BRONCHIAL BIOPSY  10/29/2022   Procedure: BRONCHIAL BIOPSIES;  Surgeon: Gloriajean Large, MD;  Location: [facility] ENDOSCOPY;  Service: Pulmonary;;   BRONCHIAL WASHINGS  10/29/2022   Procedure: BRONCHIAL WASHINGS;  Surgeon: Gloriajean Large, MD;  Location: [facility] ENDOSCOPY;  Service: Pulmonary;;   BRONCHOSCOPY  09/2022   benign biopsies, negative for eosinophils, granuloma, fungal (Meier)   CARDIAC CATHETERIZATION N/A 04/30/2015   Procedure: Left Heart Cath and Coronary Angiography;  Surgeon: Millicent Ally, MD;  Location: [facility] INVASIVE CV LAB;  Service: Cardiovascular; LM nl, ost/p LAD 20%, patent mLAD stent, RI small, OM2 patent stent, pRCA 20%, patent stent, EF 45-50%-->Med Rx    cervical spine stimulator  08/2012   CHOLECYSTECTOMY  06/10/2015   Procedure: LAPAROSCOPIC CHOLECYSTECTOMY;  Surgeon: Alphonso Aschoff Kinsinger, MD;  Location: [facility] OR;  Service: General;;   COLONOSCOPY  09/2013   TA x2, rpt 5 yrs (Pyrtle)   COLONOSCOPY WITH ESOPHAGOGASTRODUODENOSCOPY (EGD)  06/2018   reactive gastropathy, tubular adenoma, rpt colonoscopy 5 yrs (Mansouraty)   COLONOSCOPY WITH PROPOFOL  N/A 06/06/2018   Procedure: COLONOSCOPY WITH PROPOFOL ;  Surgeon: Normie Becton., MD;  Location: [facility] ENDOSCOPY;  Service: Gastroenterology;  Laterality: N/A;   CORONARY  ANGIOPLASTY WITH STENT PLACEMENT  1995, 2000, 2005   stents in RCA, LAD & Cx-OM. -Stents were patent by cardiac catheterization in November 2016   ENDOSCOPIC RETROGRADE CHOLANGIOPANCREATOGRAPHY (ERCP) WITH PROPOFOL  N/A 02/09/2018   Procedure: ENDOSCOPIC RETROGRADE CHOLANGIOPANCREATOGRAPHY (ERCP) WITH PROPOFOL ;  Surgeon: Normie Becton., MD;  Location: [facility] ENDOSCOPY;  Service: Gastroenterology;  Laterality: N/A;   ENDOSCOPIC RETROGRADE CHOLANGIOPANCREATOGRAPHY (ERCP) WITH PROPOFOL  N/A 03/30/2018   Procedure: ENDOSCOPIC RETROGRADE CHOLANGIOPANCREATOGRAPHY (ERCP) WITH PROPOFOL ;  Surgeon: Brice Campi Albino Alu., MD;  Location: [facility] ENDOSCOPY;  Service: Gastroenterology;  Laterality: N/A;   ENDOSCOPIC RETROGRADE CHOLANGIOPANCREATOGRAPHY (ERCP) WITH PROPOFOL  N/A 06/05/2021   Procedure: ENDOSCOPIC RETROGRADE CHOLANGIOPANCREATOGRAPHY (ERCP) WITH PROPOFOL ;  Surgeon: Brice Campi Albino Alu., MD;  Location: [facility] ENDOSCOPY;  Service: Gastroenterology;  Laterality: N/A;   ENDOSCOPIC RETROGRADE CHOLANGIOPANCREATOGRAPHY (ERCP) WITH PROPOFOL  N/A 09/15/2021   Procedure: ENDOSCOPIC RETROGRADE CHOLANGIOPANCREATOGRAPHY (ERCP) WITH PROPOFOL ;  Surgeon: Brice Campi Albino Alu., MD;  Location: [facility] ENDOSCOPY;  Service: Gastroenterology;  Laterality: N/A;   ERCP N/A 01/08/2018   Procedure: ENDOSCOPIC RETROGRADE CHOLANGIOPANCREATOGRAPHY (ERCP);  Surgeon: Lajuan Pila, MD;  Location: [facility] ENDOSCOPY;  Service: Endoscopy;  Laterality: N/A;   ERCP N/A 04/01/2021   Procedure: ENDOSCOPIC RETROGRADE CHOLANGIOPANCREATOGRAPHY (  ERCP);  Surgeon: Lajuan Pila, MD;  Location: [facility] ENDOSCOPY;  Service: Endoscopy;  Laterality: N/A;  Please schedule after 1 PM   ESOPHAGOGASTRODUODENOSCOPY N/A 03/30/2018   Procedure: ESOPHAGOGASTRODUODENOSCOPY (EGD);  Surgeon: Normie Becton., MD;  Location: [facility] ENDOSCOPY;  Service: Gastroenterology;  Laterality: N/A;   ESOPHAGOGASTRODUODENOSCOPY (EGD) WITH PROPOFOL  N/A 06/06/2018   Procedure:  ESOPHAGOGASTRODUODENOSCOPY (EGD) WITH PROPOFOL ;  Surgeon: Brice Campi Albino Alu., MD;  Location: [facility] ENDOSCOPY;  Service: Gastroenterology;  Laterality: N/A;   ESOPHAGOGASTRODUODENOSCOPY (EGD) WITH PROPOFOL  N/A 07/06/2018   Procedure: ESOPHAGOGASTRODUODENOSCOPY (EGD) WITH PROPOFOL ;  Surgeon: Brice Campi Albino Alu., MD;  Location: [facility] ENDOSCOPY;  Service: Gastroenterology;  Laterality: N/A;   ESOPHAGOGASTRODUODENOSCOPY (EGD) WITH PROPOFOL  N/A 07/24/2021   Procedure: ESOPHAGOGASTRODUODENOSCOPY (EGD) WITH PROPOFOL  WITH FLUOROSCOPY;  Surgeon: Mansouraty, Albino Alu., MD;  Location: [facility] ENDOSCOPY;  Service: Gastroenterology;  Laterality: N/A;   EUS N/A 07/24/2021   Procedure: UPPER ENDOSCOPIC ULTRASOUND (EUS) RADIAL;  Surgeon: Normie Becton., MD;  Location: [facility] ENDOSCOPY;  Service: Gastroenterology;  Laterality: N/A;   lumbar spine stimulator     POLYPECTOMY  06/06/2018   Procedure: POLYPECTOMY;  Surgeon: Mansouraty, Albino Alu., MD;  Location: [facility] ENDOSCOPY;  Service: Gastroenterology;;   REMOVAL OF STONES  03/30/2018   Procedure: REMOVAL OF STONES;  Surgeon: Normie Becton., MD;  Location: [facility] ENDOSCOPY;  Service: Gastroenterology;;   REMOVAL OF STONES  06/05/2021   Procedure: REMOVAL OF STONES;  Surgeon: Normie Becton., MD;  Location: [facility] ENDOSCOPY;  Service: Gastroenterology;;   REMOVAL OF STONES  09/15/2021   Procedure: REMOVAL OF STONES;  Surgeon: Normie Becton., MD;  Location: [facility] ENDOSCOPY;  Service: Gastroenterology;;   [address]  01/08/2018   Procedure: [address];  Surgeon: Lajuan Pila, MD;  Location: [facility] ENDOSCOPY;  Service: Endoscopy;;   SPHINCTEROTOMY  02/09/2018   Procedure: [address];  Surgeon: Mansouraty, Albino Alu., MD;  Location: [facility] ENDOSCOPY;  Service: Gastroenterology;;   [facility] CHOLANGIOSCOPY N/A 06/05/2021   Procedure: XBJYNWGN CHOLANGIOSCOPY;  Surgeon: Normie Becton., MD;  Location: [facility] ENDOSCOPY;  Service:  Gastroenterology;  Laterality: N/A;   STENT REMOVAL  02/09/2018   Procedure: STENT REMOVAL;  Surgeon: Normie Becton., MD;  Location: [facility] ENDOSCOPY;  Service: Gastroenterology;;   [place] REMOVAL  03/30/2018   Procedure: STENT REMOVAL;  Surgeon: Normie Becton., MD;  Location: [facility] ENDOSCOPY;  Service: Gastroenterology;;   [place] REMOVAL  06/05/2021   Procedure: STENT REMOVAL;  Surgeon: Normie Becton., MD;  Location: [facility] ENDOSCOPY;  Service: Gastroenterology;;   TRANSTHORACIC ECHOCARDIOGRAM  04/2015   EF 40-45%, mod antsept, ant, antlat, apical HK, mild AI/MR, PASP .   TRANSTHORACIC ECHOCARDIOGRAM  08/23/2020   EF 55 to 60%.  No RWMA.  Mild LVH.  GR 1 DD.  Normal RV size and function with normal RVP.  Normal mitral and aortic valve.   UVULOPALATOPHARYNGOPLASTY, TONSILLECTOMY AND SEPTOPLASTY  2002   VIDEO BRONCHOSCOPY N/A 10/29/2022   Procedure: VIDEO BRONCHOSCOPY WITH FLUORO;  Surgeon: Gloriajean Large, MD;  Location: [facility] ENDOSCOPY;  Service: Pulmonary;  Laterality: N/A;    Allergies  No Known Allergies  History of Present Illness      73 y.o. y/o male with a history of CAD, paroxysmal atrial fibrillation, ischemic cardiomyopathy, chronic heart failure with improved ejection fraction, hyperlipidemia, pulmonary fibrosis, sleep apnea, polio, GERD, bile duct obstruction, and chronic postural dizziness.  He previously underwent stenting in 1995, 2000, and 2005, all in [place].  In November 2016, he was admitted with cholecystitis and complained of chest pain.  He  was also septic and hypotensive.  Due to high risk, he underwent percutaneous drain.  During admission he developed A-fib with RVR and had an elevated troponin.  Echo showed an EF of 40 to 45%.  Diagnostic catheterization revealed patent LAD, OM 2, and RCA stents.  He was initially placed on oral anticoagulation with Eliquis  but this has since been discontinued due to bleeding issues.   Mr. Wiegand was last  seen in cardiology clinic in October 2024 at which time he reported fatigue and his statin was cut in half.  He also reported dyspnea.  Echocardiogram was performed in November 2024 showing EF of 60 to 65% with grade 1 diastolic dysfunction, mildly reduced RV function, RVSP of 43.2 mmHg, mild to moderate AI, and aortic sclerosis without stenosis.  Mr. Stogsdill was recently seen by his pulmonologist in the setting of dyspnea, ongoing cough, and fatigue.  Interstitial lung disease has been managed with nintedanib and cellcept .  Recommendation was made for right heart diagnostic catheterization and he was scheduled for follow-up today.  Patient notes chronic dyspnea on exertion with minimal activity.  At rest, he does not usually wear his oxygen  and is able to saturate in the high 90s but simply standing up results and a drop in saturation into the 80s, or when wearing oxygen , into the low 90s.  Though he experiences significant dyspnea on exertion, he denies any chest pressure/tightness.  He occasionally notes a twinge of a sharp and shooting chest discomfort which he describes as an electrical current, lasting a second or so, and resolving spontaneously.  He denies palpitations, PND, orthopnea, dizziness, syncope, edema, or early satiety.  He is scheduled for right heart catheterization with Dr. Addie Holstein tomorrow. Objective   Home Medications    Current Outpatient Medications  Medication Sig Dispense Refill   acetaminophen  (TYLENOL ) 500 MG tablet Take 1,000 mg by mouth every 6 (six) hours as needed for moderate pain or headache.      Ascorbic Acid (VITAMIN C ) 1000 MG tablet Take 1,000 mg by mouth daily.     aspirin  EC 81 MG tablet Take 1 tablet (81 mg total) by mouth daily. Swallow whole.     Carboxymethylcellulose Sodium (LUBRICANT EYE DROPS OP) Place 2 drops into both eyes daily as needed (for dry eyes).     Cholecalciferol  (VITAMIN D3) 50 MCG (2000 UT) TABS Take 2,000 Units by mouth daily.      gabapentin  (NEURONTIN ) 600 MG tablet TAKE 2 TABLETS BY MOUTH 3 TIMES  DAILY 600 tablet 3   ibuprofen  (ADVIL ) 200 MG tablet Take 400 mg by mouth every 6 (six) hours as needed for headache or moderate pain.     Magnesium  250 MG TABS Take 1 tablet (250 mg total) by mouth at bedtime. (Patient taking differently: Take 250 mg by mouth at bedtime.)  0   mirabegron  ER (MYRBETRIQ ) 25 MG TB24 tablet TAKE 1 TABLET BY MOUTH DAILY 100 tablet 0   mycophenolate  (CELLCEPT ) 500 MG tablet Take 3 tablets (1,500 mg total) by mouth 2 (two) times daily. 540 tablet 1   Nintedanib (OFEV ) 150 MG CAPS Take 1 capsule (150 mg total) by mouth 2 (two) times daily. 180 capsule 1   omeprazole  (PRILOSEC) 40 MG capsule Take 1 capsule (40 mg total) by mouth 2 (two) times daily before a meal. 180 capsule 4   predniSONE  (DELTASONE ) 10 MG tablet TAKE 1 TABLET BY MOUTH DAILY  WITH BREAKFAST 100 tablet 1   rosuvastatin  (CRESTOR ) 20 MG tablet Take  1 tablet (20 mg total) by mouth daily. 90 tablet 3   sulfamethoxazole -trimethoprim  (BACTRIM  DS) 800-160 MG tablet TAKE 1 TABLET BY MOUTH 3 TIMES  WEEKLY 43 tablet 2   tamsulosin  (FLOMAX ) 0.4 MG CAPS capsule TAKE 1 CAPSULE BY MOUTH EVERY DAY 90 capsule 3   vitamin B-12 (CYANOCOBALAMIN) 1000 MCG tablet Take 1,000 mcg by mouth daily.     vitamin E 400 UNIT capsule Take 400 Units by mouth daily.     No current facility-administered medications for this visit.     Family History    Family History  Problem Relation Age of Onset   Diabetes Mother    Heart disease Mother    Cancer Sister 90       male cancer   Breast cancer Brother 84   Hypertension Maternal Aunt    Cancer Maternal Uncle        spinal   Coronary artery disease Neg Hx    Stroke Neg Hx    Colon cancer Neg Hx    Pancreatic cancer Neg Hx    Stomach cancer Neg Hx    Esophageal cancer Neg Hx    Inflammatory bowel disease Neg Hx    Liver disease Neg Hx    Rectal cancer Neg Hx    He indicated that his mother is deceased.  He indicated that his father is deceased. He indicated that his sister is deceased. He indicated that the status of his brother is unknown. He indicated that all of his three sons are alive. He indicated that the status of his maternal aunt is unknown. He indicated that his maternal uncle is alive. He indicated that the status of his neg hx is unknown.   Social History    Social History   Socioeconomic History   Marital status: Married    Spouse name: Robyn   Number of children: 3   Years of education: Not on file   Highest education level: Associate degree: occupational, Scientist, product/process development, or vocational program  Occupational History   Occupation: retired  Tobacco Use   Smoking status: Former    Current packs/day: 0.00    Types: Cigarettes    Quit date: 1972    Years since quitting: 53.4   Smokeless tobacco: Never   Tobacco comments:    was very light  Vaping Use   Vaping status: Never Used  Substance and Sexual Activity   Alcohol  use: Not Currently    Comment: ocassional -maybe 2 beers   Drug use: No   Sexual activity: Yes  Other Topics Concern   Not on file  Social History Narrative   Caffeine : 1 cup soda, occasional coffee   Lives with wife Corbin Dess) and 73 yo son, 4 dogs   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   Activity: no regular activity   Diet: overall healthy, good fruits and vegetables, good amt water    Social Drivers of Corporate investment banker Strain: Low Risk  (11/08/2023)   Overall Financial Resource Strain (CARDIA)    Difficulty of Paying Living Expenses: Not very hard  Food Insecurity: No Food Insecurity (11/08/2023)   Hunger Vital Sign    Worried About Running Out of Food in the Last Year: Never true    Ran Out of Food in the Last Year: Never true  Transportation Needs: No Transportation Needs (11/08/2023)   PRAPARE - Transportation    Lack of Transportation (Medical): No    Lack  of Transportation  (Non-Medical): No  Physical Activity: Insufficiently Active (11/08/2023)   Exercise Vital Sign    Days of Exercise per Week: 2 days    Minutes of Exercise per Session: 30 min  Stress: No Stress Concern Present (11/08/2023)   [employer] of Occupational Health - Occupational Stress Questionnaire    Feeling of Stress : Not at all  Social Connections: Moderately Integrated (11/08/2023)   Social Connection and Isolation Panel [NHANES]    Frequency of Communication with Friends and Family: More than three times a week    Frequency of Social Gatherings with Friends and Family: More than three times a week    Attends Religious Services: Never    Database administrator or Organizations: Yes    Attends Engineer, structural: More than 4 times per year    Marital Status: Married  Catering manager Violence: Not At Risk (11/10/2022)   Humiliation, Afraid, Rape, and Kick questionnaire    Fear of Current or Ex-Partner: No    Emotionally Abused: No    Physically Abused: No    Sexually Abused: No     Review of Systems    General:  No chills, fever, night sweats or weight changes.  Cardiovascular:  +++ Occasional, fleeting chest pain, which she describes as feeling like electrical current.  +++ dyspnea on exertion, no edema, orthopnea, palpitations, paroxysmal nocturnal dyspnea. Dermatological: No rash, lesions/masses Respiratory: +++ cough, +++ dyspnea Urologic: No hematuria, dysuria Abdominal:   No nausea, vomiting, diarrhea, bright red blood per rectum, melena, or hematemesis Neurologic:  No visual changes, wkns, changes in mental status. All other systems reviewed and are otherwise negative except as noted above.     Physical Exam    VS:  BP 114/70 (BP Location: Right Arm, Patient Position: Sitting, Cuff Size: Normal)   Pulse 85   SpO2 95%  , BMI There is no height or weight on file to calculate BMI.     GEN: Well nourished, well developed, in no acute distress. HEENT:  normal. Neck: Supple, no JVD, carotid bruits, or masses. Cardiac: RRR, no murmurs, rubs, or gallops. No clubbing, cyanosis, edema.  Radials 2+/PT 2+ and equal bilaterally.  Respiratory:  Respirations regular and unlabored, faint diffuse crackles posteriorly. GI: Soft, nontender, nondistended, BS + x 4. MS: no deformity or atrophy. Skin: warm and dry, no rash. Neuro:  Strength and sensation are intact. Psych: Normal affect.  Accessory Clinical Findings    ECG personally reviewed by me today- EKG Interpretation Date/Time:  Wednesday November 10 2023 09:39:23 EDT Ventricular Rate:  85 PR Interval:  168 QRS Duration:  100 QT Interval:  398 QTC Calculation: 473 R Axis:   2  Text Interpretation: Normal sinus rhythm Incomplete right bundle branch block Minimal voltage criteria for LVH, may be normal variant ( R in aVL ) ST & T wave abnormality, consider inferior ischemia ST & T wave abnormality, consider anterior ischemia Prolonged QT Confirmed by Laneta Pintos [phone]) on 11/10/2023 10:04:15 AM   - No acute changes  Lab Results  Component Value Date   WBC 8.7 10/29/2023   HGB 14.8 10/29/2023   HCT 44.0 10/29/2023   MCV 99.8 10/29/2023   PLT 218.0 10/29/2023   Lab Results  Component Value Date   CREATININE 0.57 10/29/2023   BUN 18 10/29/2023   NA 139 10/29/2023   K 3.6 10/29/2023   CL 105 10/29/2023   CO2 22 10/29/2023   Lab Results  Component Value Date  ALT 19 10/29/2023   ALT 19 10/29/2023   AST 15 10/29/2023   AST 15 10/29/2023   ALKPHOS 41 10/29/2023   ALKPHOS 41 10/29/2023   BILITOT 1.1 10/29/2023   BILITOT 1.1 10/29/2023   Lab Results  Component Value Date   CHOL 135 08/25/2023   HDL 68 08/25/2023   LDLCALC 47 08/25/2023   LDLDIRECT 213.1 06/29/2013   TRIG 116 08/25/2023   CHOLHDL 2.0 08/25/2023    Lab Results  Component Value Date   HGBA1C 5.9 11/04/2022      Assessment & Plan   1.  Pulmonary hypertension: In the context of chronic hypoxic  respiratory failure and interstitial lung disease, echocardiogram in November 2024 showed normal LV function, grade 1 diastolic dysfunction, and RVSP of 43.2 mmHg.  High resolution CT in April 2025 showed pulmonary parenchymal pattern of fibrosis with enlarged pulmonary trunk and cardiomegaly.  Patient has had progressive dyspnea with minimal activity and was recently seen by pulmonology with recommendation for right heart catheterization.  He is currently scheduled for right heart cath tomorrow, June 12, with Dr. Addie Holstein.  Patient had labs earlier today.  The patient understands that risks include but are not limited to bleeding or vascular complication (1 in 500), pneumothorax (1 in 1600), arrhythmia (1 in 1000), and death (1 in 5000), and agrees to proceed.    2.  Coronary artery disease: Status post prior stenting with most recent catheterization in 2016 showing patent LAD, OM 2, and RCA stents.  Despite progressive dyspnea, he does not experience angina.  He sometimes notes a fleeting chest pain which she describes as feeling like electrical current, which has been intermittently present for a long time.  He remains on statin therapy.  He used to take aspirin  81 mg daily but says he stopped it at some point along the way.  Encouraged him to resume given his history.  3.  Ischemic cardiomyopathy/chronic heart failure with improved ejection fraction: EF previously as low as 40 to 45% with most recent echo in November 2024 showing improvement to 60 to 65%.  He is euvolemic on examination and does not require diuretic therapy.  Heart rate and blood pressure stable.  4.  Primary hypertension: Blood pressure stable today in the absence of antihypertensives.  5.  Paroxysmal atrial fibrillation: This occurred in the setting of cholecystitis and sepsis in 2016 with no known recurrence.  He is no longer on anticoagulation due to isolated episode and history of bleeding issues.  He is not on any AV nodal blocking  agents and is unaware of any palpitations.  6.  Hyperlipidemia: On rosuvastatin  therapy with an LDL of 47 earlier this year.  7.  Interstitial lung disease: See #1.  Followed closely by pulmonology.  On CellCept , nintedanib, and daily prednisone .  8.  Disposition: Plan for right heart catheterization tomorrow.  Follow-up in clinic in 3 months or sooner if necessary.   Laneta Pintos, NP 11/10/2023, 12:19 PM

## 2023-11-10 NOTE — Addendum Note (Signed)
 Addended by: Gerry Krone on: 11/10/2023 08:43 AM   Modules accepted: Orders

## 2023-11-10 NOTE — Patient Instructions (Signed)
 Medication Instructions:  Your physician recommends the following medication changes.  START TAKING: Aspirin  81 mg daily  *If you need a refill on your cardiac medications before your next appointment, please call your pharmacy*  Lab Work: None ordered at this time  If you have labs (blood work) drawn today and your tests are completely normal, you will receive your results only by: MyChart Message (if you have MyChart) OR A paper copy in the mail If you have any lab test that is abnormal or we need to change your treatment, we will call you to review the results.  Follow-Up: At [facility], you and your health needs are our priority.  As part of our continuing mission to provide you with exceptional heart care, our providers are all part of one team.  This team includes your primary Cardiologist (physician) and Advanced Practice Providers or APPs (Physician Assistants and Nurse Practitioners) who all work together to provide you with the care you need, when you need it.  Your next appointment:   3 month(s)  Provider:   Randene Bustard, MD

## 2023-11-11 ENCOUNTER — Other Ambulatory Visit: Payer: Self-pay

## 2023-11-11 ENCOUNTER — Ambulatory Visit: Payer: Self-pay | Admitting: Family Medicine

## 2023-11-11 ENCOUNTER — Ambulatory Visit
Admission: RE | Admit: 2023-11-11 | Discharge: 2023-11-11 | Disposition: A | Discharge status: Home / Self Care | Source: Ambulatory Visit | Attending: Cardiology | Admitting: Cardiology

## 2023-11-11 ENCOUNTER — Encounter
Admission: RE | Disposition: A | Discharge status: Home / Self Care | Payer: Self-pay | Source: Ambulatory Visit | Attending: Cardiology

## 2023-11-11 DIAGNOSIS — Z7952 Long term (current) use of systemic steroids: Secondary | ICD-10-CM | POA: Insufficient documentation

## 2023-11-11 DIAGNOSIS — I480 Paroxysmal atrial fibrillation: Secondary | ICD-10-CM | POA: Insufficient documentation

## 2023-11-11 DIAGNOSIS — Z79899 Other long term (current) drug therapy: Secondary | ICD-10-CM | POA: Diagnosis not present

## 2023-11-11 DIAGNOSIS — I110 Hypertensive heart disease with heart failure: Secondary | ICD-10-CM | POA: Diagnosis not present

## 2023-11-11 DIAGNOSIS — Z79624 Long term (current) use of inhibitors of nucleotide synthesis: Secondary | ICD-10-CM | POA: Insufficient documentation

## 2023-11-11 DIAGNOSIS — E785 Hyperlipidemia, unspecified: Secondary | ICD-10-CM | POA: Diagnosis not present

## 2023-11-11 DIAGNOSIS — Z87891 Personal history of nicotine dependence: Secondary | ICD-10-CM | POA: Diagnosis not present

## 2023-11-11 DIAGNOSIS — I5032 Chronic diastolic (congestive) heart failure: Secondary | ICD-10-CM | POA: Diagnosis not present

## 2023-11-11 DIAGNOSIS — I2510 Atherosclerotic heart disease of native coronary artery without angina pectoris: Secondary | ICD-10-CM | POA: Insufficient documentation

## 2023-11-11 DIAGNOSIS — J849 Interstitial pulmonary disease, unspecified: Secondary | ICD-10-CM | POA: Diagnosis present

## 2023-11-11 DIAGNOSIS — I272 Pulmonary hypertension, unspecified: Secondary | ICD-10-CM | POA: Diagnosis not present

## 2023-11-11 DIAGNOSIS — J8410 Pulmonary fibrosis, unspecified: Secondary | ICD-10-CM | POA: Insufficient documentation

## 2023-11-11 HISTORY — PX: RIGHT HEART CATH: CATH118263

## 2023-11-11 LAB — POCT I-STAT EG7
Acid-Base Excess: 0 mmol/L (ref 0.0–2.0)
Acid-Base Excess: 0 mmol/L (ref 0.0–2.0)
Bicarbonate: 24.5 mmol/L (ref 20.0–28.0)
Bicarbonate: 24.6 mmol/L (ref 20.0–28.0)
Calcium, Ion: 1.19 mmol/L (ref 1.15–1.40)
Calcium, Ion: 1.24 mmol/L (ref 1.15–1.40)
HCT: 41 % (ref 39.0–52.0)
HCT: 41 % (ref 39.0–52.0)
Hemoglobin: 13.9 g/dL (ref 13.0–17.0)
Hemoglobin: 13.9 g/dL (ref 13.0–17.0)
O2 Saturation: 65 %
O2 Saturation: 67 %
Potassium: 3.6 mmol/L (ref 3.5–5.1)
Potassium: 3.6 mmol/L (ref 3.5–5.1)
Sodium: 141 mmol/L (ref 135–145)
Sodium: 141 mmol/L (ref 135–145)
TCO2: 26 mmol/L (ref 22–32)
TCO2: 26 mmol/L (ref 22–32)
pCO2, Ven: 39.7 mmHg — ABNORMAL LOW (ref 44–60)
pCO2, Ven: 40.6 mmHg — ABNORMAL LOW (ref 44–60)
pH, Ven: 7.389 (ref 7.25–7.43)
pH, Ven: 7.4 (ref 7.25–7.43)
pO2, Ven: 34 mmHg (ref 32–45)
pO2, Ven: 35 mmHg (ref 32–45)

## 2023-11-11 LAB — BASIC METABOLIC PANEL WITH GFR
BUN/Creatinine Ratio: 28 (calc) — ABNORMAL HIGH (ref 6–22)
BUN: 15 mg/dL (ref 7–25)
CO2: 26 mmol/L (ref 20–32)
Calcium: 9.5 mg/dL (ref 8.6–10.3)
Chloride: 103 mmol/L (ref 98–110)
Creat: 0.53 mg/dL — ABNORMAL LOW (ref 0.70–1.28)
Glucose, Bld: 97 mg/dL (ref 65–99)
Potassium: 4.4 mmol/L (ref 3.5–5.3)
Sodium: 140 mmol/L (ref 135–146)
eGFR: 106 mL/min/{1.73_m2} (ref >=60)

## 2023-11-11 LAB — HEMOGLOBIN A1C
Hgb A1c MFr Bld: 5.7 % — ABNORMAL HIGH (ref <5.7)
Mean Plasma Glucose: 117 mg/dL
eAG (mmol/L): 6.5 mmol/L

## 2023-11-11 LAB — LIPID PANEL
Cholesterol: 130 mg/dL (ref <200)
HDL: 65 mg/dL (ref >=40)
LDL Cholesterol (Calc): 47 mg/dL
Non-HDL Cholesterol (Calc): 65 mg/dL (ref <130)
Total CHOL/HDL Ratio: 2 (calc) (ref <5.0)
Triglycerides: 94 mg/dL (ref <150)

## 2023-11-11 LAB — VITAMIN D 25 HYDROXY (VIT D DEFICIENCY, FRACTURES): Vit D, 25-Hydroxy: 40 ng/mL (ref 30–100)

## 2023-11-11 LAB — PSA: PSA: 2.93 ng/mL (ref <=4.00)

## 2023-11-11 SURGERY — RIGHT HEART CATH
Anesthesia: Moderate Sedation | Laterality: Right

## 2023-11-11 MED ORDER — FENTANYL CITRATE (PF) 100 MCG/2ML IJ SOLN
INTRAMUSCULAR | Status: AC
Start: 2023-11-11 — End: 2023-11-11
  Filled 2023-11-11: qty 2

## 2023-11-11 MED ORDER — SODIUM CHLORIDE 0.9 % IV SOLN: INTRAVENOUS | Status: DC

## 2023-11-11 MED ORDER — SODIUM CHLORIDE 0.9% FLUSH: 3.0000 mL | Freq: Two times a day (BID) | INTRAVENOUS | Status: DC

## 2023-11-11 MED ORDER — LIDOCAINE HCL (PF) 1 % IJ SOLN
INTRAMUSCULAR | Status: DC | PRN
  Administered 2023-11-11: 2 mL

## 2023-11-11 MED ORDER — SODIUM CHLORIDE 0.9 % IV SOLN: 250.0000 mL | INTRAVENOUS | Status: DC | PRN

## 2023-11-11 MED ORDER — LIDOCAINE HCL 1 % IJ SOLN
INTRAMUSCULAR | Status: AC
Start: 2023-11-11 — End: 2023-11-11
  Filled 2023-11-11: qty 20

## 2023-11-11 MED ORDER — HEPARIN (PORCINE) IN NACL 1000-0.9 UT/500ML-% IV SOLN
INTRAVENOUS | Status: AC
  Filled 2023-11-11: qty 1000

## 2023-11-11 MED ORDER — HEPARIN (PORCINE) IN NACL 1000-0.9 UT/500ML-% IV SOLN
INTRAVENOUS | Status: DC | PRN
  Administered 2023-11-11: 1000 mL

## 2023-11-11 MED ORDER — SODIUM CHLORIDE 0.9% FLUSH: 3.0000 mL | INTRAVENOUS | Status: DC | PRN

## 2023-11-11 MED ORDER — MIDAZOLAM HCL 2 MG/2ML IJ SOLN
INTRAMUSCULAR | Status: AC
  Filled 2023-11-11: qty 2

## 2023-11-11 MED ORDER — LABETALOL HCL 5 MG/ML IV SOLN: 10.0000 mg | INTRAVENOUS | Status: DC | PRN

## 2023-11-11 MED ORDER — HYDRALAZINE HCL 20 MG/ML IJ SOLN: 10.0000 mg | INTRAMUSCULAR | Status: DC | PRN

## 2023-11-11 SURGICAL SUPPLY — 7 items
CATH BALLN WEDGE 5F 110CM (CATHETERS) IMPLANT
DRAPE BRACHIAL (DRAPES) IMPLANT
GUIDEWIRE .025 260CM (WIRE) IMPLANT
PACK CARDIAC CATH (CUSTOM PROCEDURE TRAY) ×1 IMPLANT
SET ATX-X65L (MISCELLANEOUS) IMPLANT
SHEATH GLIDE SLENDER 4/5FR (SHEATH) IMPLANT
STATION PROTECTION PRESSURIZED (MISCELLANEOUS) IMPLANT

## 2023-11-11 NOTE — Discharge Instructions (Signed)
Right Heart Cath, Care After This sheet gives you information about how to care for yourself after your procedure. Your health care provider may also give you more specific instructions. If you have problems or questions, contact your health care provider. What can I expect after the procedure? After the procedure, it is common to have: Bruising or mild discomfort in the area where the IV was inserted (insertion site). Follow these instructions at home: Eating and drinking  You may eat and drink after your procedure.  Drink a lot of fluids for the first several days after the procedure, as directed by your health care provider. This helps to wash (flush) the contrast out of your body. Examples of healthy fluids include water or low-calorie drinks. General instructions Check your IV insertion area and also your venous access site every day for signs of infection. Check for: Redness, swelling, or pain. Fluid or blood. Warmth. Pus or a bad smell. Take over-the-counter and prescription medicines only as told by your health care provider. Rest and return to your normal activities as told by your health care provider. Ask your health care provider what activities are safe for you. Do not drive for 24 hours if you were given a medicine to help you relax (sedative), or until your health care provider approves. Keep all follow-up visits as told by your health care provider. This is important. Contact a health care provider if: Your skin becomes itchy or you develop a rash or hives. You have a fever that does not get better with medicine. You feel nauseous. You vomit. You have redness, swelling, or pain around the insertion site. You have fluid or blood coming from the insertion site. Your insertion area feels warm to the touch. You have pus or a bad smell coming from the insertion site. Get help right away if: You have difficulty breathing or shortness of breath. You develop chest pain. You  faint. You feel very dizzy. These symptoms may represent a serious problem that is an emergency. Do not wait to see if the symptoms will go away. Get medical help right away. Call your local emergency services (911 in the [place] Do not drive yourself to the hospital. Summary After your procedure, it is common to have bruising or mild discomfort in the area where the IV was inserted. You should check your IV insertion area every day for signs of infection. Take over-the-counter and prescription medicines only as told by your health care provider. You should drink a lot of fluids for the first several days after the procedure to help flush the contrast from your body. This information is not intended to replace advice given to you by your health care provider. Make sure you discuss any questions you have with your health care provider. Document Released: 03/08/2013 Document Revised: 04/30/2017 Document Reviewed: 04/11/2016 Elsevier Patient Education  2020 Elsevier Inc. 

## 2023-11-11 NOTE — Interval H&P Note (Signed)
 History and Physical Interval Note:  11/11/2023 2:01 PM  Alexander [name]  has presented today for surgery, with the diagnosis of R Cath   Interstitial lung disease   Shortness of breath.  The various methods of treatment have been discussed with the patient and family. After consideration of risks, benefits and other options for treatment, the patient has consented to  Procedure(s): RIGHT HEART CATH (Right) as a surgical intervention.  [name] history has been reviewed, patient examined, no change in status, stable for surgery.  I have reviewed the patient's chart and labs.  Questions were answered to the patient's satisfaction.     Randene Bustard

## 2023-11-12 ENCOUNTER — Encounter: Payer: Self-pay | Admitting: Cardiology

## 2023-11-17 ENCOUNTER — Telehealth: Payer: Self-pay | Admitting: Internal Medicine

## 2023-11-17 ENCOUNTER — Ambulatory Visit (INDEPENDENT_AMBULATORY_CARE_PROVIDER_SITE_OTHER): Payer: Medicare Other | Admitting: Family Medicine

## 2023-11-17 ENCOUNTER — Encounter: Payer: Self-pay | Admitting: Family Medicine

## 2023-11-17 VITALS — BP 128/80 | HR 96 | Temp 98.0°F | Ht 70.0 in | Wt 198.0 lb

## 2023-11-17 DIAGNOSIS — I10 Essential (primary) hypertension: Secondary | ICD-10-CM

## 2023-11-17 DIAGNOSIS — R3912 Poor urinary stream: Secondary | ICD-10-CM | POA: Diagnosis not present

## 2023-11-17 DIAGNOSIS — E785 Hyperlipidemia, unspecified: Secondary | ICD-10-CM

## 2023-11-17 DIAGNOSIS — E559 Vitamin D deficiency, unspecified: Secondary | ICD-10-CM | POA: Diagnosis not present

## 2023-11-17 DIAGNOSIS — J849 Interstitial pulmonary disease, unspecified: Secondary | ICD-10-CM | POA: Diagnosis not present

## 2023-11-17 DIAGNOSIS — R7303 Prediabetes: Secondary | ICD-10-CM

## 2023-11-17 DIAGNOSIS — Z7189 Other specified counseling: Secondary | ICD-10-CM

## 2023-11-17 DIAGNOSIS — Z0000 Encounter for general adult medical examination without abnormal findings: Secondary | ICD-10-CM

## 2023-11-17 DIAGNOSIS — R0981 Nasal congestion: Secondary | ICD-10-CM

## 2023-11-17 DIAGNOSIS — K219 Gastro-esophageal reflux disease without esophagitis: Secondary | ICD-10-CM

## 2023-11-17 DIAGNOSIS — I2510 Atherosclerotic heart disease of native coronary artery without angina pectoris: Secondary | ICD-10-CM

## 2023-11-17 DIAGNOSIS — N401 Enlarged prostate with lower urinary tract symptoms: Secondary | ICD-10-CM

## 2023-11-17 DIAGNOSIS — N3941 Urge incontinence: Secondary | ICD-10-CM | POA: Diagnosis not present

## 2023-11-17 DIAGNOSIS — Z0001 Encounter for general adult medical examination with abnormal findings: Secondary | ICD-10-CM

## 2023-11-17 DIAGNOSIS — M6281 Muscle weakness (generalized): Secondary | ICD-10-CM

## 2023-11-17 MED ORDER — MIRABEGRON ER 50 MG PO TB24: 50.0000 mg | ORAL_TABLET | Freq: Every day | ORAL | 3 refills | Status: AC

## 2023-11-17 MED ORDER — OMEPRAZOLE 40 MG PO CPDR: 40.0000 mg | DELAYED_RELEASE_CAPSULE | Freq: Two times a day (BID) | ORAL | 3 refills | Status: AC

## 2023-11-17 MED ORDER — TAMSULOSIN HCL 0.4 MG PO CAPS: 0.4000 mg | ORAL_CAPSULE | Freq: Every day | ORAL | 3 refills | Status: DC

## 2023-11-17 NOTE — Patient Instructions (Addendum)
 Ok to try higher Myrbetriq  dose 50mg  daily - watch blood pressures on higher dose. Let me know how you do with this.  Try flonase nasal steroid 1-2 squirts into each nostril daily + nasal saline irrigation  Good to see you today Return as needed or in 1 year for next physical

## 2023-11-17 NOTE — Telephone Encounter (Signed)
 Selma M Buboltz   DEvki  PVR < 3 mPAP < 25 but 24  Do you t ink we can get him tyvaso for who-3 PAH as standard of care?     SIGNATURE    Dr. Maire Scot, M.D., F.C.C.P,  Pulmonary and Critical Care Medicine Staff Physician, [facility] Director - Interstitial Lung Disease  Program  Pulmonary Fibrosis [facility] at [facility] [place], [place], [address]   Pager: [phone], If no answer  -> Check AMION or Try [phone] Telephone (clinical office): [phone] Telephone (research): [phone]  2:25 PM 11/17/2023

## 2023-11-17 NOTE — Progress Notes (Unsigned)
 Ph: [phone] Fax: [phone]   Patient ID: Alexander [name], male    DOB: 07-27-1950, 73 y.o.   MRN: [id number]  This visit was conducted in person.  BP 128/80   Pulse 96   Temp 98 F (36.7 C) (Oral)   SpO2 94%    CC: AMW/CPE Subjective:   HPI: Alexander [name] is a 73 y.o. male presenting on 11/17/2023 for Medicare Wellness (Pt accompanied by wife, Jerrold Morgan. )   Did not see health advisor this year.   Hearing Screening   500Hz  1000Hz  2000Hz  4000Hz   Right ear 25 25 25  0  Left ear 25 25 20  0  Vision Screening - Comments:: Last eye exam, today.   Flowsheet Row Office Visit from 11/17/2023 in [facility] [place]  PHQ-2 Total Score 2       11/17/2023    2:58 PM 11/08/2023   11:21 AM 10/29/2023   10:52 AM 08/19/2023    1:09 PM 11/10/2022    9:00 AM  Fall Risk   Falls in the past year? 0 1 0 1 0  Number falls in past yr:  0 0 0 0  Injury with Fall?  0 0 0 0  Risk for fall due to :     No Fall Risks  Follow up     Falls prevention discussed;Falls evaluation completed   ILD closely followed by pulmonology Dr Bertrum Brodie, now on cellcept , nintedanib, daily prednisone , and supplemental oxygen  to 4L [place]. On bactrim  for PJP ppx.   Saw cardiology for known CAD with ischemic CM/chronic CHF, s/p remote stent placement as well as pulmonary hypertension - most recently had R heart catheterization - borderline pulm HTN  H/o post polio syndrome /poliomyelitis sequelae with residual lower extremity (L>R) weakness/atrophy on disability, uses fitted lower extremity braces from hips to toes bilaterally regularly.    H/o recurrent ascending cholangitis s/p ERCP for choledocholithiasis removal and duodenal stricture dilation latest 08/2021.   Preventative: COLONOSCOPY WITH ESOPHAGOGASTRODUODENOSCOPY (EGD) 06/2018 - reactive gastropathy, tubular adenoma, rpt colonoscopy 5 yrs ([facility]) - rpt planned 11/2023.  Prostate cancer screening - continues yearly screening.  Nocturia x2-3, continues flomax  and myrbetriq  - will increase dose to 50mg  daily.  Lung cancer screening - not eligible  Flu shot yearly COVID vaccine Pfizer 07/2019, 08/2019, booster 03/2020, 08/2020, 01/2023 Td 2013 Prevnar-13 2016, pnuemovax 2018.  zostavax - 06/2013 shingrix - 12/2021, 05/2022 RSV 12/2021 Advanced directive: scanned 07/2021. Wife Jerrold Morgan then son Raechel Bulla are HCPOA. Doesn't want prolonged life support if terminal condition. Doesn't think would want tube feed. Wants living will followed.  Seat belt use discussed Sunscreen use discussed. No changing moles on skin.  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 Interim medical history since our last visit reviewed. Allergies and medications reviewed and updated. Outpatient Medications Prior to Visit  Medication Sig Dispense Refill  acetaminophen  (TYLENOL ) 500 MG tablet Take 1,000 mg by mouth every 6 (six) hours as needed for moderate pain or headache.      Ascorbic Acid (VITAMIN C ) 1000 MG tablet Take 1,000 mg by mouth daily.     aspirin  EC 81 MG tablet Take 1 tablet (81 mg total) by mouth daily. Swallow whole.     Carboxymethylcellulose Sodium (LUBRICANT EYE DROPS OP) Place 2 drops into both eyes daily as needed (for dry eyes).     Cholecalciferol  (VITAMIN D3) 50 MCG (2000 UT) TABS Take 2,000 Units by mouth  daily.     fluticasone (FLONASE) 50 MCG/ACT nasal spray Place 1-2 sprays into both nostrils daily as needed for allergies or rhinitis.     gabapentin  (NEURONTIN ) 600 MG tablet TAKE 2 TABLETS BY MOUTH 3 TIMES  DAILY 600 tablet 3   ibuprofen  (ADVIL ) 200 MG tablet Take 400 mg by mouth every 6 (six) hours as needed for headache or moderate pain.     Magnesium  250 MG TABS Take 1 tablet (250 mg total) by mouth at bedtime.  0   mycophenolate  (CELLCEPT ) 500 MG tablet Take 3 tablets (1,500 mg total) by mouth 2 (two) times daily. 540 tablet 1   Nintedanib (OFEV ) 150 MG CAPS Take 1 capsule (150 mg total) by mouth 2 (two) times daily. 180 capsule 1   predniSONE  (DELTASONE ) 10 MG tablet TAKE 1 TABLET BY MOUTH DAILY  WITH BREAKFAST 100 tablet 1   rosuvastatin  (CRESTOR ) 20 MG tablet Take 1 tablet (20 mg total) by mouth daily. 90 tablet 3   sulfamethoxazole -trimethoprim  (BACTRIM  DS) 800-160 MG tablet TAKE 1 TABLET BY MOUTH 3 TIMES  WEEKLY 43 tablet 2   vitamin B-12 (CYANOCOBALAMIN) 1000 MCG tablet Take 1,000 mcg by mouth daily.     vitamin E 180 MG (400 UNITS) capsule Take 400 Units by mouth daily.     mirabegron  ER (MYRBETRIQ ) 25 MG TB24 tablet TAKE 1 TABLET BY MOUTH DAILY 100 tablet 0   omeprazole  (PRILOSEC) 40 MG capsule Take 1 capsule (40 mg total) by mouth 2 (two) times daily before a meal. 180 capsule 4   tamsulosin  (FLOMAX ) 0.4 MG CAPS capsule TAKE 1 CAPSULE BY MOUTH EVERY DAY 90 capsule 3   No facility-administered medications prior to visit.     Per HPI unless specifically indicated in ROS section below Review of Systems  Constitutional:  Negative for activity change, appetite change, chills, fatigue, fever and unexpected weight change.  HENT:  Negative for congestion and hearing loss.   Eyes:  Negative for visual disturbance.  Respiratory:  Positive for cough and shortness of breath. Negative for chest tightness and wheezing.        H/o ILD  Cardiovascular:  Negative for chest pain, palpitations  and leg swelling.  Gastrointestinal:  Negative for abdominal distention, abdominal pain, blood in stool, constipation, diarrhea, nausea and vomiting.  Genitourinary:  Negative for difficulty urinating and hematuria.  Musculoskeletal:  Negative for arthralgias, myalgias and neck pain.  Skin:  Negative for rash.  Neurological:  Negative for dizziness, seizures, syncope and headaches.  Hematological:  Negative for adenopathy. Bruises/bleeds easily.  Psychiatric/Behavioral:  Negative for dysphoric mood. The patient is not nervous/anxious.     Objective:  BP 128/80   Pulse 96   Temp 98 F (36.7 C) (Oral)   SpO2 94%   Wt Readings from Last 3 Encounters:  11/11/23 199 lb (90.3 kg)  10/29/23 201 lb (91.2 kg)  09/20/23 202 lb (91.6 kg)  Physical Exam Vitals and nursing note reviewed.  Constitutional:      General: He is not in acute distress.    Appearance: Normal appearance. He is well-developed. He is not ill-appearing.  HENT:     Head: Normocephalic and atraumatic.     Right Ear: Hearing, tympanic membrane, ear canal and external ear normal.     Left Ear: Hearing, tympanic membrane, ear canal and external ear normal.     Mouth/Throat:     Mouth: Mucous membranes are moist.     Pharynx: Oropharynx is clear. No oropharyngeal exudate.   Eyes:     General: No scleral icterus.    Extraocular Movements: Extraocular movements intact.     Conjunctiva/sclera: Conjunctivae normal.     Pupils: Pupils are equal, round, and reactive to light.   Neck:     Thyroid : No thyroid  mass or thyromegaly.     Vascular: No carotid bruit.   Cardiovascular:     Rate and Rhythm: Normal rate and regular rhythm.     Pulses: Normal pulses.          Radial pulses are 2+ on the right side and 2+ on the left side.     Heart sounds: Normal heart sounds. No murmur heard. Pulmonary:     Effort: Pulmonary effort is normal. No respiratory distress.     Breath sounds: No wheezing, rhonchi or rales.      Comments: Mild crackles bibasilarly Abdominal:     General: Bowel sounds are normal. There is no distension.     Palpations: Abdomen is soft. There is no mass.     Tenderness: There is no abdominal tenderness. There is no guarding or rebound.     Hernia: No hernia is present.   Musculoskeletal:        General: Normal range of motion.     Cervical back: Normal range of motion and neck supple.     Right lower leg: No edema.     Left lower leg: No edema.     Comments: Ambulates with bilateral arm braces  Lymphadenopathy:     Cervical: No cervical adenopathy.   Skin:    General: Skin is warm and dry.     Findings: No rash.   Neurological:     General: No focal deficit present.     Mental Status: He is alert and oriented to person, place, and time.     Comments:  Recall 3/3 Calculation 5/5 serial 7s  Psychiatric:        Mood and Affect: Mood normal.        Behavior: Behavior normal.        Thought Content: Thought content normal.        Judgment: Judgment normal.       Results for orders placed or performed during the hospital encounter of 11/11/23  POCT I-Stat EG7   Collection Time: 11/11/23  2:33 PM  Result Value Ref Range   pH, Ven 7.400 7.25 - 7.43   pCO2, Ven 39.7 (L) 44 - 60 mmHg   pO2, Ven 35 32 - 45 mmHg   Bicarbonate 24.6 20.0 - 28.0 mmol/L   TCO2 26 22 - 32 mmol/L   O2 Saturation 67 %   Acid-Base Excess 0.0 0.0 - 2.0 mmol/L   Sodium 141 135 - 145 mmol/L   Potassium 3.6 3.5 - 5.1 mmol/L   Calcium , Ion 1.19 1.15 - 1.40 mmol/L   HCT 41.0 39.0 - 52.0 %   Hemoglobin  13.9 13.0 - 17.0 g/dL   Sample type VENOUS    Comment NOTIFIED PHYSICIAN   POCT I-Stat EG7   Collection Time: 11/11/23  2:39 PM  Result Value Ref Range   pH, Ven 7.389 7.25 - 7.43   pCO2, Ven 40.6 (L) 44 - 60 mmHg   pO2, Ven 34 32 - 45 mmHg   Bicarbonate 24.5 20.0 - 28.0 mmol/L   TCO2 26 22 - 32 mmol/L   O2 Saturation 65 %   Acid-Base Excess 0.0 0.0 - 2.0 mmol/L   Sodium 141 135 - 145 mmol/L    Potassium 3.6 3.5 - 5.1 mmol/L   Calcium , Ion 1.24 1.15 - 1.40 mmol/L   HCT 41.0 39.0 - 52.0 %   Hemoglobin 13.9 13.0 - 17.0 g/dL   Sample type VENOUS    Comment NOTIFIED PHYSICIAN    Lab Results  Component Value Date   WBC 8.7 10/29/2023   HGB 13.9 11/11/2023   HCT 41.0 11/11/2023   MCV 99.8 10/29/2023   PLT 218.0 10/29/2023    Assessment & Plan:   Problem List Items Addressed This Visit     GERD (gastroesophageal reflux disease)   Relevant Medications   omeprazole  (PRILOSEC) 40 MG capsule   BPH (benign prostatic hyperplasia) - Primary   Relevant Medications   tamsulosin  (FLOMAX ) 0.4 MG CAPS capsule   Urinary incontinence   Relevant Medications   mirabegron  ER (MYRBETRIQ ) 50 MG TB24 tablet   tamsulosin  (FLOMAX ) 0.4 MG CAPS capsule     Meds ordered this encounter  Medications   mirabegron  ER (MYRBETRIQ ) 50 MG TB24 tablet    Sig: Take 1 tablet (50 mg total) by mouth daily.    Dispense:  100 tablet    Refill:  3   omeprazole  (PRILOSEC) 40 MG capsule    Sig: Take 1 capsule (40 mg total) by mouth 2 (two) times daily before a meal.    Dispense:  200 capsule    Refill:  3   tamsulosin  (FLOMAX ) 0.4 MG CAPS capsule    Sig: Take 1 capsule (0.4 mg total) by mouth daily.    Dispense:  100 capsule    Refill:  3    No orders of the defined types were placed in this encounter.   Patient Instructions  Ok to try higher Myrbetriq  dose 50mg  daily - watch blood pressures on higher dose. Let me know how you do with this.  Try flonase nasal steroid 1-2 squirts into each nostril daily + nasal saline irrigation  Good to see you today Return as needed or in 1 year for next physical   Follow up plan: Return in about 1 year (around 11/16/2024) for medicare wellness visit.  Claire Crick, MD

## 2023-11-19 ENCOUNTER — Telehealth: Payer: Self-pay

## 2023-11-19 NOTE — Telephone Encounter (Signed)
 Patient was called to confirm that he was NOT taking Eliquis . Patient states that he is not on a blood thinning medication. Medications were reviewed and confirmed that he is not on a blood thinner.  We will proceed with PV on 11/29/23.   Luanna Rung, LPN (PV )

## 2023-11-20 DIAGNOSIS — R0981 Nasal congestion: Secondary | ICD-10-CM | POA: Insufficient documentation

## 2023-11-20 NOTE — Assessment & Plan Note (Signed)
 Managed with omeprazole  40mg  bid - continue this.  Upcoming GI eval.

## 2023-11-20 NOTE — Assessment & Plan Note (Signed)
 Continue flomax , trial titrating myrbetriq  dosing

## 2023-11-20 NOTE — Assessment & Plan Note (Signed)
 Previously discussed.

## 2023-11-20 NOTE — Assessment & Plan Note (Signed)
 Chronic, stable on vit D3 2000 units daily - continue

## 2023-11-20 NOTE — Assessment & Plan Note (Addendum)
 Notes ongoing trouble with contribution of BPH - continues flomax , myrbetriq . Requests trial higher myrbetriq  - sent 50mg  dose, to monitor for hypertension.

## 2023-11-20 NOTE — Assessment & Plan Note (Signed)
 Preventative protocols reviewed and updated unless pt declined. Discussed healthy diet and lifestyle.

## 2023-11-20 NOTE — Assessment & Plan Note (Signed)
 Discussed OTC flonase, nasal saline use.

## 2023-11-20 NOTE — Assessment & Plan Note (Addendum)
 Chronic, stable with A1c 5.7% - continue to watch carb/sugar  intake.

## 2023-11-20 NOTE — Assessment & Plan Note (Addendum)
 Hypersensitivity pneumonitis - appreciate pulm care Dr Geronimo, no on Cellcept  with nintedanib (Ofev ) and daily prednisone  10mg . Also on daily bactrim  PJP ppx.

## 2023-11-20 NOTE — Assessment & Plan Note (Addendum)
 Chronic, continues Crestor  20mg  daily - with LDL 47, nonHDL 65 - overall well controlled. Continue crestor .  The ASCVD Risk score (Arnett DK, et al., 2019) failed to calculate for the following reasons:   Risk score cannot be calculated because patient has a medical history suggesting prior/existing ASCVD

## 2023-11-20 NOTE — Assessment & Plan Note (Signed)

## 2023-11-20 NOTE — Assessment & Plan Note (Signed)
Chronic, stable off medication.  

## 2023-11-24 NOTE — Telephone Encounter (Signed)
 Tyvaso DPI (through part D) may be approved through insurance and Tyvaso nebs are not likely (since nebs are billed through Part B)  Submitted a Prior Authorization request to [facility] for TYVASO DPI via CoverMyMeds. Will update once we receive a response.  Key: DAWSON Sherry Pennant, PharmD, MPH, BCPS, CPP Clinical Pharmacist (Rheumatology and Pulmonology)

## 2023-11-25 ENCOUNTER — Telehealth: Payer: Self-pay

## 2023-11-25 NOTE — Telephone Encounter (Signed)
 Received notification from [facility] regarding a prior authorization for TYVASO DPI. Authorization has been APPROVED from 11/24/2023 to 05/31/2024. Approval letter sent to scan center.  Authorization # [id number]  Submitted completed referral paperwork to [facility] for TYVASO along with HRCT, right heart cath results, and most recent progress note.  Fax# [phone] Phone# [phone]

## 2023-11-25 NOTE — Telephone Encounter (Signed)
 Dr. Geronimo requesting initiation of Tyvaso if approved   Tyvaso DPI (through part D) may be approved through insurance and Tyvaso nebs are not likely (since nebs are billed through Part B)   Submitted a Prior Authorization request to [facility] for TYVASO DPI via CoverMyMeds. Will update once we receive a response.   Key: DAWSON Sherry Pennant, PharmD, MPH, BCPS, CPP Clinical Pharmacist (Rheumatology and Pulmonology)       11/17/23  2:26 PM Geronimo Amel, MD routed this conversation to Me (Selected Message) Geronimo Amel, MD    11/17/23  2:26 PM Note Alexander [name]     Alexander [name]   PVR < 3 mPAP < 25 but 24   Do you t ink we can get him tyvaso for who-3 PAH as standard of care?

## 2023-11-29 ENCOUNTER — Ambulatory Visit (AMBULATORY_SURGERY_CENTER): Admitting: *Deleted

## 2023-11-29 ENCOUNTER — Telehealth: Payer: Self-pay | Admitting: *Deleted

## 2023-11-29 VITALS — Ht 70.0 in | Wt 198.0 lb

## 2023-11-29 DIAGNOSIS — Z860100 Personal history of colon polyps, unspecified: Secondary | ICD-10-CM

## 2023-11-29 MED ORDER — NA SULFATE-K SULFATE-MG SULF 17.5-3.13-1.6 GM/177ML PO SOLN: 1.0000 | Freq: Once | ORAL | 0 refills | Status: AC

## 2023-11-29 NOTE — Progress Notes (Addendum)
 Pt's name and DOB verified at the beginning of the pre-visit wit 2 identifiers  Permission given to speak with wife while call  Pt denies any difficulty with ambulating,sitting, laying down or rolling side to side  Pt has no issues moving head neck or swallowing  No egg or soy allergy known to patient  Only  one episode of agitation after anesthesia  No FH of Malignant Hyperthermia  Pt is not on home 02   Pt is not on blood thinners   Pt denies issues with constipation   Pt is not on dialysis  Pt hx 2026 Afib no issues since 6 months on med  Pt denies any upcoming cardiac testing  Patient's chart reviewed by Norleen Schillings CNRA prior to pre-visit and patient appropriate for the [facility].  Pre-visit completed and red dot placed by patient's name on their procedure day (on provider's schedule).    Visit by phone  Pt states weight is 198 lb  IInstructions reviewed. Pt given  both [facility] main # and MD on call # prior to instructions.  Pt states understanding of instructions. Instructed pt to review instructions again prior to procedure and call main # given if has questions.. Pt states they will.   Instructed pt on where to find instructions on My Chart. 'Pt currently on O2 at home and will need to be done at the hospital TE to be sent to MD and OV to be made  OV made for 8/26 @ 1:50 pm with Jessica Zehr. TE sent to MD CRNA and office staff POD C

## 2023-11-29 NOTE — Telephone Encounter (Signed)
 Noted

## 2023-11-30 NOTE — Telephone Encounter (Signed)
 OV made for pt to evaluate and help vacillate getting the hospital procedure

## 2023-12-01 ENCOUNTER — Telehealth (HOSPITAL_BASED_OUTPATIENT_CLINIC_OR_DEPARTMENT_OTHER): Payer: Self-pay | Admitting: *Deleted

## 2023-12-01 NOTE — Telephone Encounter (Signed)
 Lmtcb X 1 See phone note 11/25/23  Copied from CRM [id number]. Topic: Clinical - Prescription Issue >> Dec 01, 2023  2:42 PM Corean SAUNDERS wrote: Reason for CRM: Please call patients wife back as she has been receiving calls about a medication prescribed by Dr. Geronimo called Tyvaso but patient was not made aware about anything to due with medication or that it would be ordered. Please call patients wife back  Takari Lundahl (wife) [phone]

## 2023-12-01 NOTE — Telephone Encounter (Signed)
 Hey could you give patient's spouse a call back about Tyvaso? They were unaware that it was ordered. She has received letters and a phone call from [place]. She has some questions re: cost to them.

## 2023-12-08 NOTE — Telephone Encounter (Signed)
 Dayne Sherry RAMAN, RPH-CPP    12/08/23 11:14 AM Note Received fax from [employer]. Patient received first shipment of Tyvaso DPI on 12/06/2023

## 2023-12-08 NOTE — Telephone Encounter (Signed)
 Received fax from [employer]. Patient received first shipment of Tyvaso DPI on 12/06/2023

## 2023-12-11 ENCOUNTER — Other Ambulatory Visit: Payer: Self-pay | Admitting: Cardiology

## 2023-12-11 DIAGNOSIS — E785 Hyperlipidemia, unspecified: Secondary | ICD-10-CM

## 2023-12-14 ENCOUNTER — Encounter: Admitting: Gastroenterology

## 2023-12-19 ENCOUNTER — Other Ambulatory Visit: Payer: Self-pay | Admitting: Internal Medicine

## 2023-12-19 DIAGNOSIS — J849 Interstitial pulmonary disease, unspecified: Secondary | ICD-10-CM

## 2023-12-19 DIAGNOSIS — J679 Hypersensitivity pneumonitis due to unspecified organic dust: Secondary | ICD-10-CM

## 2023-12-21 NOTE — Telephone Encounter (Signed)
 Refill sent for mycophenolate  to [facility] Delivery  Dose: 1500mg  twice daily  Last OV: 10/29/2023 Provider: Dr. Geronimo  Next OV: 01/27/2024  Sherry Pennant, PharmD, MPH, BCPS Clinical Pharmacist (Rheumatology and Pulmonology)

## 2023-12-23 NOTE — Addendum Note (Signed)
 Addended by: DAYNE SHERRY RAMAN on: 12/23/2023 02:14 PM   Modules accepted: Orders

## 2023-12-23 NOTE — Telephone Encounter (Signed)
 Per [facility] portal, patient started Tyvaso DPI on 12/13/2023. Added to med list today. F/u appointment is scheduled for 01/27/2024

## 2023-12-27 ENCOUNTER — Other Ambulatory Visit: Payer: Self-pay | Admitting: Cardiology

## 2023-12-27 DIAGNOSIS — E785 Hyperlipidemia, unspecified: Secondary | ICD-10-CM

## 2024-01-25 ENCOUNTER — Ambulatory Visit: Admitting: Gastroenterology

## 2024-01-25 ENCOUNTER — Encounter: Payer: Self-pay | Admitting: Gastroenterology

## 2024-01-25 VITALS — BP 114/80 | HR 96 | Ht 70.0 in | Wt 194.0 lb

## 2024-01-25 DIAGNOSIS — Z9981 Dependence on supplemental oxygen: Secondary | ICD-10-CM | POA: Diagnosis not present

## 2024-01-25 DIAGNOSIS — Z860100 Personal history of colon polyps, unspecified: Secondary | ICD-10-CM | POA: Diagnosis not present

## 2024-01-25 DIAGNOSIS — Z01818 Encounter for other preprocedural examination: Secondary | ICD-10-CM | POA: Diagnosis not present

## 2024-01-25 NOTE — Patient Instructions (Signed)
 Dr. Wilhelmenia nurse will reach out to schedule procedure.   _______________________________________________________  If your blood pressure at your visit was 140/90 or greater, please contact your primary care physician to follow up on this.  _______________________________________________________  If you are age 74 or older, your body mass index should be between 23-30. Your Body mass index is 27.84 kg/m. If this is out of the aforementioned range listed, please consider follow up with your Primary Care Provider.  If you are age 78 or younger, your body mass index should be between 19-25. Your Body mass index is 27.84 kg/m. If this is out of the aformentioned range listed, please consider follow up with your Primary Care Provider.   ________________________________________________________  The [facility] providers would like to encourage you to use MYCHART to communicate with providers for non-urgent requests or questions.  Due to long hold times on the telephone, sending your provider a message by [facility] may be a faster and more efficient way to get a response.  Please allow 48 business hours for a response.  Please remember that this is for non-urgent requests.  _______________________________________________________  [facility] is using a team-based approach to care.  Your team is made up of your doctor and two to three APPS. Our APPS (Nurse Practitioners and Physician Assistants) work with your physician to ensure care continuity for you. They are fully qualified to address your health concerns and develop a treatment plan. They communicate directly with your gastroenterologist to care for you. Seeing the Advanced Practice Practitioners on your physician's team can help you by facilitating care more promptly, often allowing for earlier appointments, access to diagnostic testing, procedures, and other specialty referrals.

## 2024-01-25 NOTE — Progress Notes (Signed)
 01/25/2024 Alexander [name] [id number] 1950-12-27   HISTORY OF PRESENT ILLNESS:  This is a 73 year old male who is a patient of Dr. Melba.  He is here today to schedule colonoscopy.  Has PMH of pulmonary fibrosis, on Ofev  and cellcept  and uses O2.  Also has pulmonary HTN, history of polio with chronic pain related to that and had issues with recurrent choledocholithiasis/cholangitis.  Has some issues with diarrhea due to medications and pulmonary recommended Carob powder, which he uses to help regulate his bowels.  No rectal bleeding.  Colonoscopy 06/2018:  - Non- thrombosed internal hemorrhoids and perianal erythema found on digital rectal exam. - The examined portion of the ileum was normal. Biopsied. - One 2 mm polyp at the hepatic flexure, removed with a jumbo cold forceps. Resected and retrieved. - Normal mucosa in the entire examined colon. Biopsied. - Normal mucosa in the rectum. Biopsied. - Non- bleeding non- thrombosed internal hemorrhoids.  5. Ileum, biopsy, Terminal Ileum - SMALL BOWEL MUCOSA WITH LYMPHOID AGGREGATES - NO ACUTE INFLAMMATION, GRANULOMAS OR MALIGNANCY IDENTIFIED 6. Colon, biopsy, Random - BENIGN COLONIC MUCOSA - NO ACTIVE INFLAMMATION OR EVIDENCE OF MICROSCOPIC COLITIS - NO HIGH GRADE DYSPLASIA OR MALIGNANCY IDENTIFIED 7. Colon, polyp(s), Hepatic Flexure - TUBULAR ADENOMA (1 OF 2 FRAGMENTS) - BENIGN COLONIC MUCOSA (1 OF 2 FRAGMENTS) - NO HIGH GRADE DYSPLASIA OR MALIGNANCY IDENTIFIED 8. Rectum, biopsy - BENIGN COLONIC MUCOSA - NO ACTIVE INFLAMMATION OR EVIDENCE OF MICROSCOPIC COLITIS - NO HIGH GRADE DYSPLASIA OR MALIGNANCY IDENTIFIED   Past Medical History:  Diagnosis Date   A-fib (HCC)    2016 No issues after 38month on meds   Acute cholecystitis 03/2016   with sepsis   Arthritis    CAD S/P percutaneous coronary angioplasty 1995, 2000, 2005   a. 1995 s/p BMS;  b. 2000 s/p BMS;  c. 2005 s/p stent - All stents in [facility] (RCA, LAD & OM -  unknown on which date);  d. 04/2015 NSTEMI/Cath: LM nl, ost/p LAD 20%, patent mLAD stent, RI small, OM2 patent stent, pRCA 20%, patent stent, EF 45-50%-->Med Rx.   Calculus of bile duct with acute cholangitis with obstruction    Chronic pain syndrome    a. Followed @ [facility];  b. Uses 12-14 excedrins per day.   Complication of anesthesia 04/01/2021   agiated- battling nurses   Duodenal stricture    Essential hypertension    Fatty liver    Gallstones    GERD (gastroesophageal reflux disease)    Heart failure with improved ejection fraction (HFimpEF) (HCC)    a. 04/2015 Echo: EF 40-45%; b. 04/2023 Echo: EF of 60 to 65% with grade 1 diastolic dysfunction, mildly reduced RV function, RVSP of 43.2 mmHg, mild to moderate AI, and aortic sclerosis without stenosis.   History of depression    History of post poliomyelitis muscular atrophy    HLD (hyperlipidemia)    Ischemic cardiomyopathy    a. 04/2015 Echo: EF 40-45%, mod antsept, ant, antlat, apical HK, mild AI/MR, PASP ; b. 04/2023 Echo: EF of 60 to 65% with grade 1 diastolic dysfunction, mildly reduced RV function, RVSP of 43.2 mmHg, mild to moderate AI, and aortic sclerosis without stenosis   Migraines    Migraines    Muscle weakness following poliomyelitis    a. ambulates with braces.   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 04/2015 in setting of cholecystits and sepsis -->Amio;  b. CHA2DS2VASc = 4--> Eliquis .   Pneumonia 04/2015   Polio    Pulmonary fibrosis (HCC)    diagnosed 10/24   Pulmonary hypertension (HCC)    a. 04/2023 Echo: RVSP 43.2 mmHg.   Septic shock (HCC) 01/07/2018   Shoulder pain, right    Sleep apnea    Past Surgical History:  Procedure Laterality Date   BALLOON DILATION N/A 06/06/2018   Procedure:  BALLOON DILATION;  Surgeon: Wilhelmenia Aloha Raddle., MD;  Location: [facility] ENDOSCOPY;  Service: Gastroenterology;  Laterality: N/A;   BALLOON DILATION N/A 07/06/2018   Procedure: BALLOON DILATION;  Surgeon: Wilhelmenia Aloha Raddle., MD;  Location: [facility] ENDOSCOPY;  Service: Gastroenterology;  Laterality: N/A;   BALLOON DILATION N/A 07/24/2021   Procedure: BALLOON DILATION;  Surgeon: Wilhelmenia Aloha Raddle., MD;  Location: [facility] ENDOSCOPY;  Service: Gastroenterology;  Laterality: N/A;   BILIARY BRUSHING  06/05/2021   Procedure: BILIARY BRUSHING;  Surgeon: Wilhelmenia Aloha Raddle., MD;  Location: [facility] ENDOSCOPY;  Service: Gastroenterology;;   BILIARY DILATION  06/05/2021   Procedure: BILIARY DILATION;  Surgeon: Wilhelmenia Aloha Raddle., MD;  Location: [facility] ENDOSCOPY;  Service: Gastroenterology;;   BILIARY DILATION  09/15/2021   Procedure: BILIARY DILATION;  Surgeon: Wilhelmenia Aloha Raddle., MD;  Location: [facility]OSCOPY;  Service: Gastroenterology;;   BILIARY STENT PLACEMENT  01/08/2018   Procedure: BILIARY STENT PLACEMENT;  Surgeon: Charlanne Groom, MD;  Location: [facility] ENDOSCOPY;  Service: Endoscopy;;   BILIARY STENT PLACEMENT N/A 02/09/2018   Procedure: BILIARY STENT PLACEMENT;  Surgeon: Wilhelmenia Aloha Raddle., MD;  Location: [facility] ENDOSCOPY;  Service: Gastroenterology;  Laterality: N/A;   BILIARY STENT PLACEMENT  04/01/2021   Procedure: BILIARY STENT PLACEMENT;  Surgeon: Charlanne Groom, MD;  Location: [facility] ENDOSCOPY;  Service: Endoscopy;;   BIOPSY  06/06/2018   Procedure: BIOPSY;  Surgeon: Wilhelmenia Aloha Raddle., MD;  Location: [facility] ENDOSCOPY;  Service: Gastroenterology;;   BIOPSY  04/01/2021   Procedure: BIOPSY;  Surgeon: Charlanne Groom, MD;  Location: [facility] ENDOSCOPY;  Service: Endoscopy;;   BIOPSY  06/05/2021   Procedure: BIOPSY;  Surgeon: Wilhelmenia Aloha Raddle., MD;  Location: [facility] ENDOSCOPY;  Service: Gastroenterology;;   BRONCHIAL BIOPSY  10/29/2022   Procedure: BRONCHIAL BIOPSIES;  Surgeon: Gladis Leonor HERO, MD;   Location: [facility] ENDOSCOPY;  Service: Pulmonary;;   BRONCHIAL WASHINGS  10/29/2022   Procedure: BRONCHIAL WASHINGS;  Surgeon: Gladis Leonor HERO, MD;  Location: [facility] ENDOSCOPY;  Service: Pulmonary;;   BRONCHOSCOPY  09/2022   benign biopsies, negative for eosinophils, granuloma, fungal ([place])   CARDIAC CATHETERIZATION N/A 04/30/2015   Procedure: Left Heart Cath and Coronary Angiography;  Surgeon: Debby DELENA Sor, MD;  Location: [facility] INVASIVE CV LAB;  Service: Cardiovascular; LM nl, ost/p LAD 20%, patent mLAD stent, RI small, OM2 patent stent, pRCA 20%, patent stent, EF 45-50%-->Med Rx    cervical spine stimulator  08/2012   CHOLECYSTECTOMY  06/10/2015   Procedure: LAPAROSCOPIC CHOLECYSTECTOMY;  Surgeon: Herlene Righter Kinsinger, MD;  Location: [facility] OR;  Service: General;;   COLONOSCOPY  09/2013   TA x2, rpt 5 yrs (Pyrtle)   COLONOSCOPY WITH ESOPHAGOGASTRODUODENOSCOPY (EGD)  06/2018   reactive gastropathy, tubular adenoma, rpt colonoscopy 5 yrs (Mansouraty)   COLONOSCOPY WITH PROPOFOL  N/A 06/06/2018   Procedure: COLONOSCOPY WITH PROPOFOL ;  Surgeon: Wilhelmenia Aloha Raddle., MD;  Location: [facility] ENDOSCOPY;  Service: Gastroenterology;  Laterality: N/A;   CORONARY ANGIOPLASTY WITH STENT PLACEMENT  1995, 2000, 2005   stents in RCA, LAD & Cx-OM. -Stents were patent by cardiac catheterization in November 2016   ENDOSCOPIC RETROGRADE CHOLANGIOPANCREATOGRAPHY (ERCP) WITH PROPOFOL  N/A 02/09/2018   Procedure: ENDOSCOPIC RETROGRADE CHOLANGIOPANCREATOGRAPHY (ERCP) WITH PROPOFOL ;  Surgeon: Wilhelmenia Aloha Raddle., MD;  Location: [facility] ENDOSCOPY;  Service: Gastroenterology;  Laterality: N/A;   ENDOSCOPIC RETROGRADE CHOLANGIOPANCREATOGRAPHY (ERCP) WITH PROPOFOL  N/A 03/30/2018   Procedure: ENDOSCOPIC RETROGRADE CHOLANGIOPANCREATOGRAPHY (ERCP) WITH PROPOFOL ;  Surgeon: Wilhelmenia Aloha Raddle., MD;  Location: [facility] ENDOSCOPY;  Service: Gastroenterology;  Laterality: N/A;   ENDOSCOPIC RETROGRADE CHOLANGIOPANCREATOGRAPHY (ERCP) WITH PROPOFOL   N/A 06/05/2021   Procedure: ENDOSCOPIC RETROGRADE CHOLANGIOPANCREATOGRAPHY (ERCP) WITH PROPOFOL ;  Surgeon: Wilhelmenia Aloha Raddle., MD;  Location: [facility] ENDOSCOPY;  Service: Gastroenterology;  Laterality: N/A;   ENDOSCOPIC RETROGRADE CHOLANGIOPANCREATOGRAPHY (ERCP) WITH PROPOFOL  N/A 09/15/2021   Procedure: ENDOSCOPIC RETROGRADE CHOLANGIOPANCREATOGRAPHY (ERCP) WITH PROPOFOL ;  Surgeon: Wilhelmenia Aloha Raddle., MD;  Location: [facility] ENDOSCOPY;  Service: Gastroenterology;  Laterality: N/A;   ERCP N/A 01/08/2018   Procedure: ENDOSCOPIC RETROGRADE CHOLANGIOPANCREATOGRAPHY (ERCP);  Surgeon: Charlanne Groom, MD;  Location: [facility] ENDOSCOPY;  Service: Endoscopy;  Laterality: N/A;   ERCP N/A 04/01/2021   Procedure: ENDOSCOPIC RETROGRADE CHOLANGIOPANCREATOGRAPHY (ERCP);  Surgeon: Charlanne Groom, MD;  Location: [facility] ENDOSCOPY;  Service: Endoscopy;  Laterality: N/A;  Please schedule after 1 PM   ESOPHAGOGASTRODUODENOSCOPY N/A 03/30/2018   Procedure: ESOPHAGOGASTRODUODENOSCOPY (EGD);  Surgeon: Wilhelmenia Aloha Raddle., MD;  Location: [facility] ENDOSCOPY;  Service: Gastroenterology;  Laterality: N/A;   ESOPHAGOGASTRODUODENOSCOPY (EGD) WITH PROPOFOL  N/A 06/06/2018   Procedure: ESOPHAGOGASTRODUODENOSCOPY (EGD) WITH PROPOFOL ;  Surgeon: Wilhelmenia Aloha Raddle., MD;  Location: [facility] ENDOSCOPY;  Service: Gastroenterology;  Laterality: N/A;   ESOPHAGOGASTRODUODENOSCOPY (EGD) WITH PROPOFOL  N/A 07/06/2018   Procedure: ESOPHAGOGASTRODUODENOSCOPY (EGD) WITH PROPOFOL ;  Surgeon: Wilhelmenia Aloha Raddle., MD;  Location: [facility] ENDOSCOPY;  Service: Gastroenterology;  Laterality: N/A;   ESOPHAGOGASTRODUODENOSCOPY (EGD) WITH PROPOFOL  N/A 07/24/2021   Procedure: ESOPHAGOGASTRODUODENOSCOPY (EGD) WITH PROPOFOL  WITH FLUOROSCOPY;  Surgeon: Mansouraty, Aloha Raddle., MD;  Location: [facility] ENDOSCOPY;  Service: Gastroenterology;  Laterality: N/A;   EUS N/A 07/24/2021   Procedure: UPPER ENDOSCOPIC ULTRASOUND (EUS) RADIAL;  Surgeon: Wilhelmenia Aloha Raddle., MD;  Location: [facility]  ENDOSCOPY;  Service: Gastroenterology;  Laterality: N/A;   lumbar spine stimulator     POLYPECTOMY  06/06/2018   Procedure: POLYPECTOMY;  Surgeon: Mansouraty, Aloha Raddle., MD;  Location: [facility] ENDOSCOPY;  Service: Gastroenterology;;   REMOVAL OF STONES  03/30/2018   Procedure: REMOVAL OF STONES;  Surgeon: Wilhelmenia Aloha Raddle., MD;  Location: [facility] ENDOSCOPY;  Service: Gastroenterology;;   REMOVAL OF STONES  06/05/2021   Procedure: REMOVAL OF STONES;  Surgeon: Wilhelmenia Aloha Raddle., MD;  Location: [facility] ENDOSCOPY;  Service: Gastroenterology;;   REMOVAL OF STONES  09/15/2021   Procedure: REMOVAL OF STONES;  Surgeon: Wilhelmenia Aloha Raddle., MD;  Location: [facility] ENDOSCOPY;  Service: Gastroenterology;;   RIGHT HEART CATH Right 11/11/2023   Procedure: RIGHT HEART CATH;  Surgeon: Anner Alm ORN, MD;  Location: [facility] INVASIVE CV LAB;  Service: Cardiovascular;  Laterality: Right;   SPHINCTEROTOMY  01/08/2018   Procedure: SPHINCTEROTOMY;  Surgeon: Charlanne Groom, MD;  Location: [facility] ENDOSCOPY;  Service: Endoscopy;;   SPHINCTEROTOMY  02/09/2018   Procedure: ANNETT;  Surgeon: Mansouraty, Aloha Raddle., MD;  Location: [facility] ENDOSCOPY;  Service: Gastroenterology;;   LAHOMA CHOLANGIOSCOPY N/A 06/05/2021   Procedure: DEBHOJDD CHOLANGIOSCOPY;  Surgeon: Wilhelmenia Aloha Raddle., MD;  Location: [facility] ENDOSCOPY;  Service: Gastroenterology;  Laterality: N/A;   STENT REMOVAL  02/09/2018  Procedure: STENT REMOVAL;  Surgeon: Wilhelmenia Aloha Raddle., MD;  Location: [facility] ENDOSCOPY;  Service: Gastroenterology;;   CLEDA REMOVAL  03/30/2018   Procedure: STENT REMOVAL;  Surgeon: Wilhelmenia Aloha Raddle., MD;  Location: [facility] ENDOSCOPY;  Service: Gastroenterology;;   CLEDA REMOVAL  06/05/2021   Procedure: STENT REMOVAL;  Surgeon: Wilhelmenia Aloha Raddle., MD;  Location: [facility] ENDOSCOPY;  Service: Gastroenterology;;   TRANSTHORACIC ECHOCARDIOGRAM  04/2015   EF 40-45%, mod antsept, ant, antlat, apical HK, mild AI/MR, PASP .    TRANSTHORACIC ECHOCARDIOGRAM  08/23/2020   EF 55 to 60%.  No RWMA.  Mild LVH.  GR 1 DD.  Normal RV size and function with normal RVP.  Normal mitral and aortic valve.   UVULOPALATOPHARYNGOPLASTY, TONSILLECTOMY AND SEPTOPLASTY  2002   VIDEO BRONCHOSCOPY N/A 10/29/2022   Procedure: VIDEO BRONCHOSCOPY WITH FLUORO;  Surgeon: Gladis Leonor HERO, MD;  Location: [facility] ENDOSCOPY;  Service: Pulmonary;  Laterality: N/A;    reports that he quit smoking about 53 years ago. His smoking use included cigarettes. He has never used smokeless tobacco. He reports that he does not currently use alcohol . He reports that he does not use drugs. family history includes Breast cancer (age of onset: 42) in his brother; Cancer in his maternal uncle; Cancer (age of onset: 47) in his sister; Diabetes in his mother; Heart disease in his mother; Hypertension in his maternal aunt. No Known Allergies    Outpatient Encounter Medications as of 01/25/2024  Medication Sig   acetaminophen  (TYLENOL ) 500 MG tablet Take 1,000 mg by mouth every 6 (six) hours as needed for moderate pain or headache.    Ascorbic Acid (VITAMIN C ) 1000 MG tablet Take 1,000 mg by mouth daily.   aspirin  EC 81 MG tablet Take 1 tablet (81 mg total) by mouth daily. Swallow whole.   Carboxymethylcellulose Sodium (LUBRICANT EYE DROPS OP) Place 2 drops into both eyes daily as needed (for dry eyes).   Carob, Locust Bean Gum, POWD 1 Scoop by Does not apply route daily.   Cholecalciferol  (VITAMIN D3) 50 MCG (2000 UT) TABS Take 2,000 Units by mouth daily.   fluticasone (FLONASE) 50 MCG/ACT nasal spray Place 1-2 sprays into both nostrils daily as needed for allergies or rhinitis.   gabapentin  (NEURONTIN ) 600 MG tablet TAKE 2 TABLETS BY MOUTH 3 TIMES  DAILY   ibuprofen  (ADVIL ) 200 MG tablet Take 400 mg by mouth every 6 (six) hours as needed for headache or moderate pain.   Magnesium  250 MG TABS Take 1 tablet (250 mg total) by mouth at bedtime.   mirabegron  ER (MYRBETRIQ ) 50  MG TB24 tablet Take 1 tablet (50 mg total) by mouth daily.   mycophenolate  (CELLCEPT ) 500 MG tablet Take 3 tablets (1,500 mg total) by mouth 2 (two) times daily.   Nintedanib (OFEV ) 150 MG CAPS Take 1 capsule (150 mg total) by mouth 2 (two) times daily.   omeprazole  (PRILOSEC) 40 MG capsule Take 1 capsule (40 mg total) by mouth 2 (two) times daily before a meal.   predniSONE  (DELTASONE ) 10 MG tablet TAKE 1 TABLET BY MOUTH DAILY  WITH BREAKFAST   rosuvastatin  (CRESTOR ) 20 MG tablet TAKE 1 TABLET BY MOUTH DAILY   sulfamethoxazole -trimethoprim  (BACTRIM  DS) 800-160 MG tablet TAKE 1 TABLET BY MOUTH 3 TIMES  WEEKLY   Treprostinil (TYVASO DPI MAINTENANCE KIT) 64 MCG POWD Inhale 64 mcg into the lungs in the morning, at noon, in the evening, and at bedtime.   vitamin B-12 (CYANOCOBALAMIN) 1000 MCG tablet Take 1,000 mcg  by mouth daily.   vitamin E 180 MG (400 UNITS) capsule Take 400 Units by mouth daily.   [DISCONTINUED] tamsulosin  (FLOMAX ) 0.4 MG CAPS capsule Take 1 capsule (0.4 mg total) by mouth daily.   No facility-administered encounter medications on file as of 01/25/2024.    REVIEW OF SYSTEMS  : All other systems reviewed and negative except where noted in the History of Present Illness.   PHYSICAL EXAM: BP 114/80   Pulse 96   Ht 5' 10 (1.778 m)   Wt 194 lb (88 kg) Comment: per patient  SpO2 94%   BMI 27.84 kg/m  General: Well developed male in no acute distress Head: Normocephalic and atraumatic Eyes:  Sclerae anicteric, conjunctiva pink. Ears: Normal auditory acuity Lungs: Clear throughout to auscultation; no W/R/R. Heart: Regular rate and rhythm; no M/R/G. Rectal:  Will be done at the time of colonoscopy. Skin: No lesions on visible extremities Neurological: Alert oriented x 4, grossly non-focal Psychological:  Alert and cooperative. Normal mood and affect  ASSESSMENT AND PLAN: *Personal history of colon polyps:  Last colonoscopy 06/2018 with TA and repeat recommended in 5 years.   Needs to be performed at [facility] hospital due to O2 use.  Will send to Dr. Wilhelmenia for scheduling. *Oxygen  dependent   CC:  Rilla Baller, MD

## 2024-01-27 ENCOUNTER — Ambulatory Visit: Admitting: Internal Medicine

## 2024-01-27 ENCOUNTER — Encounter: Payer: Self-pay | Admitting: Internal Medicine

## 2024-01-27 ENCOUNTER — Ambulatory Visit (INDEPENDENT_AMBULATORY_CARE_PROVIDER_SITE_OTHER): Admitting: Internal Medicine

## 2024-01-27 ENCOUNTER — Ambulatory Visit: Payer: Self-pay | Admitting: Internal Medicine

## 2024-01-27 VITALS — BP 114/62 | HR 81 | Ht 70.0 in | Wt 194.0 lb

## 2024-01-27 DIAGNOSIS — Z5181 Encounter for therapeutic drug level monitoring: Secondary | ICD-10-CM

## 2024-01-27 DIAGNOSIS — R0902 Hypoxemia: Secondary | ICD-10-CM

## 2024-01-27 DIAGNOSIS — J679 Hypersensitivity pneumonitis due to unspecified organic dust: Secondary | ICD-10-CM | POA: Diagnosis not present

## 2024-01-27 DIAGNOSIS — Z79899 Other long term (current) drug therapy: Secondary | ICD-10-CM | POA: Diagnosis not present

## 2024-01-27 DIAGNOSIS — I2723 Pulmonary hypertension due to lung diseases and hypoxia: Secondary | ICD-10-CM

## 2024-01-27 DIAGNOSIS — J849 Interstitial pulmonary disease, unspecified: Secondary | ICD-10-CM

## 2024-01-27 DIAGNOSIS — Z2989 Encounter for other specified prophylactic measures: Secondary | ICD-10-CM

## 2024-01-27 DIAGNOSIS — R0609 Other forms of dyspnea: Secondary | ICD-10-CM

## 2024-01-27 DIAGNOSIS — R053 Chronic cough: Secondary | ICD-10-CM

## 2024-01-27 LAB — CBC WITH DIFFERENTIAL/PLATELET
Basophils Absolute: 0.1 K/uL (ref 0.0–0.1)
Basophils Relative: 0.5 % (ref 0.0–3.0)
Eosinophils Absolute: 0.2 K/uL (ref 0.0–0.7)
Eosinophils Relative: 1.5 % (ref 0.0–5.0)
HCT: 43.5 % (ref 39.0–52.0)
Hemoglobin: 14.5 g/dL (ref 13.0–17.0)
Lymphocytes Relative: 10.4 % — ABNORMAL LOW (ref 12.0–46.0)
Lymphs Abs: 1.1 K/uL (ref 0.7–4.0)
MCHC: 33.2 g/dL (ref 30.0–36.0)
MCV: 102.2 fl — ABNORMAL HIGH (ref 78.0–100.0)
Monocytes Absolute: 0.6 K/uL (ref 0.1–1.0)
Monocytes Relative: 5.5 % (ref 3.0–12.0)
Neutro Abs: 9 K/uL — ABNORMAL HIGH (ref 1.4–7.7)
Neutrophils Relative %: 82.1 % — ABNORMAL HIGH (ref 43.0–77.0)
Platelets: 220 K/uL (ref 150.0–400.0)
RBC: 4.26 Mil/uL (ref 4.22–5.81)
RDW: 13.8 % (ref 11.5–15.5)
WBC: 11 K/uL — ABNORMAL HIGH (ref 4.0–10.5)

## 2024-01-27 LAB — PULMONARY FUNCTION TEST
DL/VA % pred: 75 %
DL/VA: 3.01 ml/min/mmHg/L
DLCO cor % pred: 39 %
DLCO cor: 10.09 ml/min/mmHg
DLCO unc % pred: 39 %
DLCO unc: 10.09 ml/min/mmHg
FEF 25-75 Pre: 2.28 L/s
FEF2575-%Pred-Pre: 98 %
FEV1-%Pred-Pre: 58 %
FEV1-Pre: 1.83 L
FEV1FVC-%Pred-Pre: 115 %
FEV6-%Pred-Pre: 53 %
FEV6-Pre: 2.17 L
FEV6FVC-%Pred-Pre: 106 %
FVC-%Pred-Pre: 50 %
FVC-Pre: 2.17 L
Pre FEV1/FVC ratio: 84 %
Pre FEV6/FVC Ratio: 100 %

## 2024-01-27 LAB — BASIC METABOLIC PANEL WITH GFR
BUN: 19 mg/dL (ref 6–23)
CO2: 23 meq/L (ref 19–32)
Calcium: 8.8 mg/dL (ref 8.4–10.5)
Chloride: 105 meq/L (ref 96–112)
Creatinine, Ser: 0.56 mg/dL (ref 0.40–1.50)
GFR: 97.51 mL/min (ref >60.00)
Glucose, Bld: 109 mg/dL — ABNORMAL HIGH (ref 70–99)
Potassium: 4.7 meq/L (ref 3.5–5.1)
Sodium: 139 meq/L (ref 135–145)

## 2024-01-27 LAB — HEPATIC FUNCTION PANEL
ALT: 19 U/L (ref 0–53)
AST: 21 U/L (ref 0–37)
Albumin: 4.1 g/dL (ref 3.5–5.2)
Alkaline Phosphatase: 34 U/L — ABNORMAL LOW (ref 39–117)
Bilirubin, Direct: 0.2 mg/dL (ref 0.0–0.3)
Total Bilirubin: 1 mg/dL (ref 0.2–1.2)
Total Protein: 7.1 g/dL (ref 6.0–8.3)

## 2024-01-27 NOTE — Progress Notes (Signed)
Spirometry/DLCO performed today,

## 2024-01-27 NOTE — Patient Instructions (Signed)
 Spirometry/DLCO performed today.

## 2024-01-27 NOTE — Patient Instructions (Addendum)
#  Interstitial lung disease due to HP #Encounter Therapeutic monitoring #High risk medication use with PJP prophylasix #Chronic steroids  - Slowly progressive  over time though clinically stable 01/27/2024 versus spring 2025 esp after staring tyvaso  - on max medical therapy  Plan  - check cbc, bmet, lft  - continue cellcept  - continue prednisone  10mg  per day (taper in future)  - continue ofev  - continue bactirim    #Diarrhea: This is due to nintedanib on top of CellCept   - much better after CAROB  Plan - continue CAROB  -Do spirometry and DLCO in 4 months  #WHO-3 Pulmonary Hypertension - new dx June/July 2025  - on tyvaso since July 2025 and helping  Plan  - continue tyvso 64mcg DPI  Follow-up - 16-week 30-minute visit with Dr. Geronimo but after spirometry and DLCO

## 2024-01-27 NOTE — Progress Notes (Signed)
 73yM with history of CAD, HTN, ICM, OSA not on CPAP, pAF, very light smoking in past. Had covid-19 a year ago in January was not hospitalized with it.   Coughing, short of breath, little energy over last 4 months. Cough is dry occasionally productive of sputum but he just swallows it. He does have sinus congestion and postnasal drainage. He tried flonase on and off since November. Benadryl here and there. He is on omeprazole  BID for reflux. No solid/liquid food dysphagia.   Got steroid course in October/Nov, steroids did not help. Had another round of steroids/antibiotic which did not help.   Had polio as a kid and started to need braces/crutches in 2002.   Some morning stiffness but takes 10-15 min to loosen up, no rashes, raynaud's phenomenon, ulcers in mouth or nose.   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 Some exposure to cleaning agents in past, some solvents. No MJ, vaping. Born in [place], lived for 40y in [place] and then was in [place] in [place] , moved to [place]. 3 dogs. No hot tub.   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 Gladis  Time of Conference: 7.00am- 8.00am Date of conference: 09/08/2022 Location of Conference: -  Virtual  Participating Pulmonary: Dr. Dorethia [name], Dr Alexander [name], Dr. Leonor Alexander [name] Pathology: Dr Alexander [name] Radiology: Dr Alexander [name]  Others:   Brief History: 73 year old was on amio for 6 months. Has post polio syndrome. Has severe GERD . [place] Does a LOT of GARDENING. When lived in [place] it was damp. Serology negative. 5-6 months of symptoms. HAd mild covid a  year ago. Does this seem to be consistent with chronic hp from dust exposure enough to treat empirically?   Should I alternatively send for surgical lung biopsy or instead consider bronchoscopy/BAL for cell count/diff first?   MDD discussion of CT scan    - Date or time period of scan: HRCT: 07/24/2022   - Discussion synopsis:  clearest case of alternative diagnosis. Has quite a bit of traction bronchiectasis.  There is lot of air trapping . Upper lobe predminant.   - What is the final conclusion per 2018 ATS/Fleischner Criteria - Leading differential is Chronic HP  - Concordance with official report: CONCORDANT  Pathology discussion of biopsy: No lung biopsy    MDD Impression/Recs: Suspect chronic HP ., REC a) send HP panel; b) ILD questionnaire; c) shared decision making for bronch BAL/Ttbx for histopath and envisia  OV May 2024   Interval HPI:  ILD conference recommends shared decision-making discussion wrt bronch/BAL/TBLB  PSG with moderate OSA AHI of 20 and SpO2 low of 75%  Does wear mask when he heads out to do yardwork.  Overall feels more energetic than at last visit.   Otherwise pertinent review of systems is negative.  xxxxxxxxxxxxxxxxxxxxxxxxxxxxxxxxxxxx[id number] Interval HPI: June 2024  Underwent bronch/BAL/tblb 5/30. BAL diff with 18% lymphs, 11% eos.  NEGATIVE ENVISIA. Non-diagnostic TTBX for Histo pat  Gabapentin , ibuprofen  with case report of eosinophilic pna but not this pattern Rosuvastatin  with ILD but not this pattern  Overall still with a lack of energy. No change in DOE, cough. Has not yet set up appointment to get  autoPAP, playing phone tag with [employer].   Otherwise pertinent review of systems is negative.  - Start prednisone  30 mg daily - see instructions for taper  - Spirometry next visit - see you in 2-3 months or sooner if need be!  OV 01/28/2023 -transfer of care from Dr. Leonor Alexander [name] to Dr. Geronimo in the ILD center.  Subjective:  Patient ID: Alexander [name], male , DOB: 1951/05/23 , age 73 y.o. , MRN: [id number] , ADDRESS: [address] [place] [address] PCP Alexander Baller, MD Patient Care Team: Alexander Baller, MD as PCP - General (Family Medicine) Anner Alm ORN, MD as PCP - Cardiology (Cardiology) Anner Alm ORN, MD as Consulting Physician (Cardiology) Linard Alm NOVAK, MD (Inactive) as Consulting Physician (Pulmonary Disease)  This Provider for this visit: Treatment Team:  Attending Provider: Geronimo Amel, MD    01/28/2023 -   Chief Complaint  Patient presents with   Consult    Former pt of Dr. Craft.  Discuss labs and cxr/scan     HPI Alexander [name] 73 y.o. -presents for follow-up.  History is provided by reviewing of the prior records and also talking to his wife and talking to him   #background: He grew up in [place] .  At age 73 he suffered from infantile polio.  After that he recovered he just walked with a limp.  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  He also then worked for [employer]  during the same time.[place]  He is to smoke occasionally during this time.  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  No visible mold.  He then retired and then moved to [place] because his wife is originally from [place]  and his sister-in-law lives in this area.  In the last few years his polio has flared up and is now using crutches.  #ILD reviewed the CT scans of the chest suggest  that he had ILD even in 2016 and even in  and 2022.  In early 2023 he suffered from COVID-19.  Then by October 2023 started having a cough and then established with Dr. Leonor Alexander [name] in early 2024.  In my personal visualization the ILD has progressed.  In conference they have raised the specter of hypersensitive pneumonitis because of air trapping and the features.  I personally agree with this.  His main symptoms was cough followed by shortness of breath.  During this time he had a bronchoscopy with lavage is mixed cellularity.  Nondiagnostic on RNA genomic classifier and histopathology transbronchial biopsies.  However this does not preclude chronic HP.  His serologies are essentially normal.  By mid June 2024 after the ILD conference started on prednisone  30 mg/day.  He slowly taper this and currently on 10 mg.  The cough resolved but according to his wife is coming back now at the lower dose of prednisone .  Uses oxygen  for the last 1  month.  Today room air at rest was 94%.       OV 03/18/2023  Subjective:  Patient ID: Alexander [name], male , DOB: 1951-05-12 , age 49 y.o. , MRN: [id number] , ADDRESS: [address] [place] [address] PCP Alexander Baller, MD Patient Care Team: Alexander Baller, MD as PCP - General (Family Medicine) Anner Alm ORN, MD as PCP - Cardiology (Cardiology) Anner Alm ORN, MD as Consulting Physician (Cardiology) Linard Alm NOVAK, MD (Inactive) as Consulting Physician (Pulmonary Disease)  This Provider for this visit: Treatment Team:  Attending Provider: Geronimo Amel, MD    03/18/2023 -   Chief Complaint  Patient presents with   Follow-up    ILD- Doing ok SOB is getting worse and his cough is worse now that he is done with prednisone . UTD on flu, covid, pneumo and rsv vaccines. Wants to go over labs. Lots of mucous.  Before sit stand O2 on room air 94 pulse of 101. After sit stand O2 was 82 and pulse was 129.   HPI Alexander [name]  73 y.o. -returns for follow-up.    #Interstitial lung disease and chronic hypersensitive pneumonitis associate with high risk prescription and encounter for therapeutic monitoring  -Oxygen  requirement is the same.  Symptom scores around stable but he does tell me that and his wife also affirms that since going up on the CellCept  1000 mg twice daily he is a little more fatigued.  This also corresponds with him going down on the prednisone  to 10 mg/day.  Took a shared decision making for him to hold at this dose without having to either taper the prednisone  further or escalate the CellCept  to 1500 mg twice daily.  He is okay with this.  Today we will get safety labs.  He continues on his 2 L oxygen  with exercise and at night.  Unclear if he stopped his fish oil  #New complaint: He is a lot of grandkids around him and he feels because of this exposure he is having chronic sinus complaint.  For the last few days it is also worse with greenish drainage.  He is already on Bactrim  for PCP prophylaxis.   OV 08/19/2023  Subjective:  Patient ID: Alexander [name], male , DOB: 14-Aug-1950 , age 30 y.o. , MRN: [id number] , ADDRESS: [address] [place] [address] PCP Alexander Baller, MD Patient Care Team: Alexander Baller, MD as PCP - General (Family Medicine) Anner Alm ORN, MD as PCP - Cardiology (Cardiology) Anner Alm ORN, MD as Consulting Physician (Cardiology) Linard Alm NOVAK, MD (Inactive) as Consulting Physician (Pulmonary Disease)  This Provider for this visit: Treatment Team:  Attending Provider: Geronimo Amel, MD     08/19/2023 -   Chief Complaint  Patient presents with   Follow-up     HPI Alexander [name] 73 y.o. -presents with his wife.  It appears that overall he is stable.  It is dyspnea score shows he is stable.  However when he did exercise hypoxemia test he seemed to desaturate faster than back in October 2024.  Back in October 2024 for sinus complaints  work the biggest issue but CT scan sinus was noncontributory.  He also improved to Augmentin .  In January 2025 he did have influenza treated as an outpatient.  He feels stable but his biggest complaint is his quality of life.  Minimal exertion makes him desaturate and minimal exertion makes him cough significantly.  He does not always use his  oxygen  with exertion because of his polio braces.  But he feels miserable.  He is asking for stronger medications.  He is now completed for few months on CellCept  overall he is tolerating it well.  He needs a safety check today.     We discussed nintedanib - Nintedanib/Ofev  requires intensive drug monitoring due to high concerns for Adverse effects of , including  Drug Induced Liver Injury, significant GI side effects that include but not limited to Diarrhea, Nausea, Vomiting,  and other system side effects that include Fatigue,  weight loss. Cardiac side effects are a black box warning as well. These will be monitored with  blood work such as LFT initially once a month for 6 months and then quarterly  - He agreed to start      OV 10/29/2023  Subjective:  Patient ID: Alexander [name], male , DOB: Aug 24, 1950 , age 56 y.o. , MRN: [id number] , ADDRESS: [address] [place] [address] PCP Alexander Baller, MD Patient Care Team: Alexander Baller, MD as PCP - General (Family Medicine) Anner Alm ORN, MD as PCP - Cardiology (Cardiology) Anner Alm ORN, MD as Consulting Physician (Cardiology) Linard Alm NOVAK, MD (Inactive) as Consulting Physician (Pulmonary Disease)  This Provider for this visit: Treatment Team:  Attending Provider: Geronimo Amel, MD    10/29/2023 -   Chief Complaint  Patient presents with   Follow-up    Productive cough (clear), SOB     HPI Alexander [name] 73 y.o. -returns for follow-up.  He is now on nintedanib since the last 30 or 40 days.  He states he is tolerating it well except he has a mild diarrhea.   From a shortness of breath perspective he feels he is stable although exertional dyspnea slightly worse.  His cough itself is better.  Of note he sustained a bruise in his left arm after he lifted it in the suspender got stuck there.  I did indicate to him that nintedanib was extremely weak anticoagulant but I do not think the bruising was related to that but we going to monitor for the future.  He did pulmonary function test and CT scan of the chest both show progressive disease [progressive phenotype].  I did indicate to him that was the right decision to start nintedanib.  We discussed about other care options.  Particularly clinical trials as a care option.  Went over the following points.    He and his wife are interested in clinical trial as a care option.  Reach out to the research coordinator and discuss prescreening for to nebulizer by studies which inhaled treprostinil or inhaled pirfenidone.    OV 01/27/2024  Subjective:  Patient ID: Alexander [name], male , DOB: Jun 01, 1951 , age 83 y.o. , MRN: [id number] , ADDRESS: [address] [place] [address] PCP Alexander Baller, MD Patient Care Team: Alexander Baller, MD as PCP - General (Family Medicine) Anner Alm ORN, MD as PCP - Cardiology (Cardiology) Anner Alm ORN, MD as Consulting Physician (Cardiology) Linard Alm NOVAK, MD (Inactive) as Consulting Physician (Pulmonary Disease)  This Provider for this visit: Treatment Team:  Attending Provider: Geronimo Amel, MD    01/27/2024 -   Chief Complaint  Patient presents with   Medical Management of Chronic Issues   Interstitial Lung Disease    Breathing has improved with Tyvaso use. No new co's.     Interstitial lung disease chronic hypersensitive pneumonitis  -Etiology mold exposure while working in  [place] many years ago  -Diagnosis given end of August 2024  -Progressive phenotype with COVID in 2022 stirring the pot   -   on prednisone  since mid June  2024   -Started CellCept  and prophylactic Bactrim  mid September 2024  Sleep apnea: CPAP compliance report July 2024 through mid October 2024: 77% compliance for greater than 4 hours and 19% compliance for less than 4 hours a total of 96% compliance.  Severe chronic cough   - exertional  -  - RAST panel  negative   #Braces for walking-history of polio.  #High risk medication with CellCept   -Immunosuppressant with multiple toxicities including opportunistic infections that require intensive therapeutic monitoring  - sTrted OFEV  spring 2025 (March 2025)  #High risk medication with chronic prednisone   - cellcept  -    - prednisone    - Bactrim  for PJP prophylaxis.    - ofev  :  Nintedanib/Ofev  requires intensive drug monitoring due to high concerns for Adverse effects of , including  Drug Induced Liver Injury, significant GI side effects that include but not limited to Diarrhea, Nausea, Vomiting,  and other system side effects that include Fatigue,  weight loss. Cardiac side effects are a black box warning as well. These will be monitored with  blood work such as LFT initially once a month for 6 months and then quarterly   #WHO-3 [facility] 11/11/23 -  Pulmonary Hypertension: PAP-mean 35/17-24 mmHg. PCWP ~10 mmHg. Ao sat 95% W PA sat 66%: Cardiac Output-Index by Fick 5. 0 5-2.43  - STARTED TYVASO July 2025     HPI Alexander [name] 73 y.o. -presents for interstitial lung disease secondary to hypersensitive pneumonitis.  Progressive phenotype.  Since last visit June 2025 had right heart catheterization.  He has WHO group 3 pulmonary hypertension with a mean pulmonary artery pressure of 24 that meets diagnostic criteria.  By July 2025 he started him on inhaled treprostinil DPI.  He is now taking 64 mcg 4 times daily and tolerating it quite well except for mild occasional sore throat.  His wife is here with him she is independent historian and she attends the same.  Ever since starting time also has  upper tolerance better if dyspnea is better is better.  He is able to do more things in the house such as vacuuming.  He is quite pleased about this.  For his interstitial lung disease he feels this   For high risk medication use is stable continues CellCept  and nintedanib chronic prednisone  and Bactrim  prophylaxis.  His diarrhea is much better after he started drinking CAROB flour.  He needs safety labs today.  His medication regimen was discussed with in-house pharmacist  Aleck Puls.  And medications were reviewed.  For chronic hypoxic respiratory failure: He feels he easily desaturates but when we tested him this is not the case.  But he continues to use oxygen .        SYMPTOM SCALE - ILD 01/28/2023 03/18/2023  08/19/2023  10/29/2023 Cellcept  prednisone  Ofev  x 30-40 days 01/27/2024 Cellcept  Pred Ofev  Tyvaso (July 2025) O2   Current weight Ra 94%      O2 use 2L  Not really good about wearing his portable oxygen  with exertion    Shortness of Breath 0 -> 5 scale with 5 being worst (score 6 If unable to do)      At rest 3 1 2 2 1   Simple tasks - showers, clothes change, eating, shaving 3 3 3 4 3   Household (dishes,  doing bed, laundry) 3 3 3 4 3   Shopping 4 1 0 na na  Walking level at own pace 5 5 3 5 3   Walking up Stairs 5 5 5 5 4   Total (30-36) Dyspnea Score 23 18 16 20 14   How bad is your cough? 3 4 4 4 3   How bad is your fatigue 3 4 4 4 3   How bad is nausea 0 0 0 0 0  How bad is vomiting?  0 0 0 0 2  How bad is diarrhea? 0 1 0 2 3  How bad is anxiety? 0 0 3 3 1   How bad is depression 0 0 1 2 1   Any chronic pain - if so where and how bad 0  0 0 3    Simple office walk 224 (66+46 x 2) feet Pod A at [facility] x  3 laps goal with forehead probe 03/18/2023  08/19/2023  01/27/2024   O2 used ra ra ra  Number laps completed Sit stand x 15 Sit stand times 10 Sit stand x 1  Comments about pace Nrmal pace    Resting Pulse Ox/HR 94% and 102/min 100% with heart rate of 103  97%   Final Pulse Ox/HR 82% and 129/min 85% with a heart rate of 142 96%  Desaturated </= 88% yes Yes   Desaturated <= 3% points yes Yes yes   Got Tachycardic >/= 90/min yes Yes   Symptoms at end of test Dyspnea  Very dyspneic and labored   Miscellaneous comments x ?  Worse       PFT     Latest Ref Rng & Units 01/27/2024    7:51 AM 09/20/2023    9:33 AM 04/02/2023   10:40 AM 08/21/2022   10:42 AM 08/01/2015    2:22 PM  PFT Results  FVC-Pre L 2.17  P 2.24  2.41  2.42  5.42  P  FVC-Predicted Pre % 50  P 51  55  55  113  P  FVC-Post L    2.12  5.35  P  FVC-Predicted Post %    48  112  P  Pre FEV1/FVC % % 84  P 86  86  84  85  P  Post FEV1/FCV % %    90  87  P  FEV1-Pre L 1.83  P 1.92  2.07  2.04  4.58  P  FEV1-Predicted Pre % 58  P 61  65  64  129  P  FEV1-Post L    1.92  4.65  P  DLCO uncorrected ml/min/mmHg 10.09  P 11.78  9.61  17.86  20.37  P  DLCO UNC% % 39  P 46  37  69  60  P  DLCO corrected ml/min/mmHg 10.09  P 11.65  9.50  17.86    DLCO COR %Predicted % 39  P 45  37  69    DLVA Predicted % 75  P 87  68  93  57  P  TLC L    4.08  7.55  P  TLC % Predicted %    58  104  P  RV % Predicted %    59  82  P    P Preliminary result       LAB RESULTS last 96 hours No results found.       has a past medical history of A-fib (HCC), Acute cholecystitis (03/2016), Arthritis, CAD S/P percutaneous coronary angioplasty (1995,  2000, 2005), Calculus of bile duct with acute cholangitis with obstruction, Chronic pain syndrome, Complication of anesthesia (04/01/2021), Duodenal stricture, Essential hypertension, Fatty liver, Gallstones, GERD (gastroesophageal reflux disease), Heart failure with improved ejection fraction (HFimpEF) (HCC), History of depression, History of post poliomyelitis muscular atrophy, HLD (hyperlipidemia), Ischemic cardiomyopathy, Migraines, Migraines, Muscle weakness following poliomyelitis, Neuromuscular disorder (HCC), Neuropathy, OSA (obstructive sleep apnea),  Oxygen  deficiency, PAF (paroxysmal atrial fibrillation) (HCC), Pneumonia (04/2015), Polio, Pulmonary fibrosis (HCC), Pulmonary hypertension (HCC), Septic shock (HCC) (01/07/2018), Shoulder pain, right, and Sleep apnea.   reports that he quit smoking about 53 years ago. His smoking use included cigarettes. He has never used smokeless tobacco.  Past Surgical History:  Procedure Laterality Date   BALLOON DILATION N/A 06/06/2018   Procedure: BALLOON DILATION;  Surgeon: Mansouraty, Aloha Raddle., MD;  Location: [facility] ENDOSCOPY;  Service: Gastroenterology;  Laterality: N/A;   BALLOON DILATION N/A 07/06/2018   Procedure: BALLOON DILATION;  Surgeon: Wilhelmenia Aloha Raddle., MD;  Location: [facility] ENDOSCOPY;  Service: Gastroenterology;  Laterality: N/A;   BALLOON DILATION N/A 07/24/2021   Procedure: BALLOON DILATION;  Surgeon: Wilhelmenia Aloha Raddle., MD;  Location: [facility] ENDOSCOPY;  Service: Gastroenterology;  Laterality: N/A;   BILIARY BRUSHING  06/05/2021   Procedure: BILIARY BRUSHING;  Surgeon: Wilhelmenia Aloha Raddle., MD;  Location[facility] ENDOSCOPY;  Service: Gastroenterology;;   BILIARY DILATION  06/05/2021   Procedure: BILIARY DILATION;  Surgeon: Wilhelmenia Aloha Raddle., MD;  Location: [facility] ENDOSCOPY;  Service: Gastroenterology;;   BILIARY DILATION  09/15/2021   Procedure: BILIARY DILATION;  Surgeon: Wilhelmenia Aloha Raddle., MD;  Location: [facility] ENDOSCOPY;  Service: Gastroenterology;;   BILIARY STENT PLACEMENT  01/08/2018   Procedure: BILIARY STENT PLACEMENT;  Surgeon: Charlanne Groom, MD;  Location: [facility] ENDOSCOPY;  Service: Endoscopy;;   BILIARY STENT PLACEMENT N/A 02/09/2018   Procedure: BILIARY STENT PLACEMENT;  Surgeon: Wilhelmenia Aloha Raddle., MD;  Location: [facility] ENDOSCOPY;  Service: Gastroenterology;  Laterality: N/A;   BILIARY STENT PLACEMENT  04/01/2021   Procedure: BILIARY STENT PLACEMENT;  Surgeon: Charlanne Groom, MD;  Location: [facility] ENDOSCOPY;  Service: Endoscopy;;   BIOPSY  06/06/2018   Procedure: BIOPSY;   Surgeon: Wilhelmenia Aloha Raddle., MD;  Location: [facility] ENDOSCOPY;  Service: Gastroenterology;;   BIOPSY  04/01/2021   Procedure: BIOPSY;  Surgeon: Charlanne Groom, MD;  Location: [facility] ENDOSCOPY;  Service: Endoscopy;;   BIOPSY  06/05/2021   Procedure: BIOPSY;  Surgeon: Wilhelmenia Aloha Raddle., MD;  Location: [facility] ENDOSCOPY;  Service: Gastroenterology;;   BRONCHIAL BIOPSY  10/29/2022   Procedure: BRONCHIAL BIOPSIES;  Surgeon: Alexander [name] Alexander HERO, MD;  Location: [facility] ENDOSCOPY;  Service: Pulmonary;;   BRONCHIAL WASHINGS  10/29/2022   Procedure: BRONCHIAL WASHINGS;  Surgeon: Alexander [name] Alexander HERO, MD;  Location: [facility] ENDOSCOPY;  Service: Pulmonary;;   BRONCHOSCOPY  09/2022   benign biopsies, negative for eosinophils, granuloma, fungal (Meier)   CARDIAC CATHETERIZATION N/A 04/30/2015   Procedure: Left Heart Cath and Coronary Angiography;  Surgeon: Debby DELENA Sor, MD;  Location: [facility] INVASIVE CV LAB;  Service: Cardiovascular; LM nl, ost/p LAD 20%, patent mLAD stent, RI small, OM2 patent stent, pRCA 20%, patent stent, EF 45-50%-->Med Rx    cervical spine stimulator  08/2012   CHOLECYSTECTOMY  06/10/2015   Procedure: LAPAROSCOPIC CHOLECYSTECTOMY;  Surgeon: Herlene Righter Kinsinger, MD;  Location: [facility] OR;  Service: General;;   COLONOSCOPY  09/2013   TA x2, rpt 5 yrs (Pyrtle)   COLONOSCOPY WITH ESOPHAGOGASTRODUODENOSCOPY (EGD)  06/2018   reactive gastropathy, tubular adenoma, rpt colonoscopy 5 yrs (Mansouraty)   COLONOSCOPY WITH PROPOFOL   N/A 06/06/2018   Procedure: COLONOSCOPY WITH PROPOFOL ;  Surgeon: Mansouraty, Aloha Raddle., MD;  Location: [facility] ENDOSCOPY;  Service: Gastroenterology;  Laterality: N/A;   CORONARY ANGIOPLASTY WITH STENT PLACEMENT  1995, 2000, 2005   stents in RCA, LAD & Cx-OM. -Stents were patent by cardiac catheterization in November 2016   ENDOSCOPIC RETROGRADE CHOLANGIOPANCREATOGRAPHY (ERCP) WITH PROPOFOL  N/A 02/09/2018   Procedure: ENDOSCOPIC RETROGRADE CHOLANGIOPANCREATOGRAPHY (ERCP) WITH PROPOFOL ;  Surgeon:  Wilhelmenia Aloha Raddle., MD;  Location: [facility] ENDOSCOPY;  Service: Gastroenterology;  Laterality: N/A;   ENDOSCOPIC RETROGRADE CHOLANGIOPANCREATOGRAPHY (ERCP) WITH PROPOFOL  N/A 03/30/2018   Procedure: ENDOSCOPIC RETROGRADE CHOLANGIOPANCREATOGRAPHY (ERCP) WITH PROPOFOL ;  Surgeon: Wilhelmenia Aloha Raddle., MD;  Location: [facility] ENDOSCOPY;  Service: Gastroenterology;  Laterality: N/A;   ENDOSCOPIC RETROGRADE CHOLANGIOPANCREATOGRAPHY (ERCP) WITH PROPOFOL  N/A 06/05/2021   Procedure: ENDOSCOPIC RETROGRADE CHOLANGIOPANCREATOGRAPHY (ERCP) WITH PROPOFOL ;  Surgeon: Wilhelmenia Aloha Raddle., MD;  Location: [facility] ENDOSCOPY;  Service: Gastroenterology;  Laterality: N/A;   ENDOSCOPIC RETROGRADE CHOLANGIOPANCREATOGRAPHY (ERCP) WITH PROPOFOL  N/A 09/15/2021   Procedure: ENDOSCOPIC RETROGRADE CHOLANGIOPANCREATOGRAPHY (ERCP) WITH PROPOFOL ;  Surgeon: Wilhelmenia Aloha Raddle., MD;  Location: [facility] ENDOSCOPY;  Service: Gastroenterology;  Laterality: N/A;   ERCP N/A 01/08/2018   Procedure: ENDOSCOPIC RETROGRADE CHOLANGIOPANCREATOGRAPHY (ERCP);  Surgeon: Charlanne Groom, MD;  Location: [facility] ENDOSCOPY;  Service: Endoscopy;  Laterality: N/A;   ERCP N/A 04/01/2021   Procedure: ENDOSCOPIC RETROGRADE CHOLANGIOPANCREATOGRAPHY (ERCP);  Surgeon: Charlanne Groom, MD;  Location: [facility] ENDOSCOPY;  Service: Endoscopy;  Laterality: N/A;  Please schedule after 1 PM   ESOPHAGOGASTRODUODENOSCOPY N/A 03/30/2018   Procedure: ESOPHAGOGASTRODUODENOSCOPY (EGD);  Surgeon: Wilhelmenia Aloha Raddle., MD;  Location: [facility]COPY;  Service: Gastroenterology;  Laterality: N/A;   ESOPHAGOGASTRODUODENOSCOPY (EGD) WITH PROPOFOL  N/A 06/06/2018   Procedure: ESOPHAGOGASTRODUODENOSCOPY (EGD) WITH PROPOFOL ;  Surgeon: Wilhelmenia Aloha Raddle., MD;  Location: [facility] ENDOSCOPY;  Service: Gastroenterology;  Laterality: N/A;   ESOPHAGOGASTRODUODENOSCOPY (EGD) WITH PROPOFOL  N/A 07/06/2018   Procedure: ESOPHAGOGASTRODUODENOSCOPY (EGD) WITH PROPOFOL ;  Surgeon: Wilhelmenia Aloha Raddle., MD;   Location: [facility] ENDOSCOPY;  Service: Gastroenterology;  Laterality: N/A;   ESOPHAGOGASTRODUODENOSCOPY (EGD) WITH PROPOFOL  N/A 07/24/2021   Procedure: ESOPHAGOGASTRODUODENOSCOPY (EGD) WITH PROPOFOL  WITH FLUOROSCOPY;  Surgeon: Mansouraty, Aloha Raddle., MD;  Location: [facility] ENDOSCOPY;  Service: Gastroenterology;  Laterality: N/A;   EUS N/A 07/24/2021   Procedure: UPPER ENDOSCOPIC ULTRASOUND (EUS) RADIAL;  Surgeon: Wilhelmenia Aloha Raddle., MD;  Location: [facility] ENDOSCOPY;  Service: Gastroenterology;  Laterality: N/A;   lumbar spine stimulator     POLYPECTOMY  06/06/2018   Procedure: POLYPECTOMY;  Surgeon: Mansouraty, Aloha Raddle., MD;  Location: [facility] ENDOSCOPY;  Service: Gastroenterology;;   REMOVAL OF STONES  03/30/2018   Procedure: REMOVAL OF STONES;  Surgeon: Wilhelmenia Aloha Raddle., MD;  Location: [facility]OSCOPY;  Service: Gastroenterology;;   REMOVAL OF STONES  06/05/2021   Procedure: REMOVAL OF STONES;  Surgeon: Wilhelmenia Aloha Raddle., MD;  Location: [facility] ENDOSCOPY;  Service: Gastroenterology;;   REMOVAL OF STONES  09/15/2021   Procedure: REMOVAL OF STONES;  Surgeon: Wilhelmenia Aloha Raddle., MD;  Location: [facility]OSCOPY;  Service: Gastroenterology;;   RIGHT HEART CATH Right 11/11/2023   Procedure: RIGHT HEART CATH;  Surgeon: Anner Alm ORN, MD;  Location: [facility] INVASIVE CV LAB;  Service: Cardiovascular;  Laterality: Right;   SPHINCTEROTOMY  01/08/2018   Procedure: SPHINCTEROTOMY;  Surgeon: Charlanne Groom, MD;  Location: [facility] ENDOSCOPY;  Service: Endoscopy;;   SPHINCTEROTOMY  02/09/2018   Procedure: ANNETT;  Surgeon: Mansouraty, Aloha Raddle., MD;  Location: [facility];  Service: Gastroenterology;;   LAHOMA CHOLANGIOSCOPY N/A 06/05/2021   Procedure: DEBHOJDD CHOLANGIOSCOPY;  Surgeon: Wilhelmenia Aloha  Mickey., MD;  Location: [facility] ENDOSCOPY;  Service: Gastroenterology;  Laterality: N/A;   STENT REMOVAL  02/09/2018   Procedure: STENT REMOVAL;  Surgeon: Wilhelmenia Aloha Mickey., MD;  Location: [facility]OSCOPY;   Service: Gastroenterology;;   CLEDA REMOVAL  03/30/2018   Procedure: STENT REMOVAL;  Surgeon: Wilhelmenia Aloha Mickey., MD;  Location: [facility] ENDOSCOPY;  Service: Gastroenterology;;   CLEDA REMOVAL  06/05/2021   Procedure: STENT REMOVAL;  Surgeon: Wilhelmenia Aloha Mickey., MD;  Location: [facility] ENDOSCOPY;  Service: Gastroenterology;;   TRANSTHORACIC ECHOCARDIOGRAM  04/2015   EF 40-45%, mod antsept, ant, antlat, apical HK, mild AI/MR, PASP .   TRANSTHORACIC ECHOCARDIOGRAM  08/23/2020   EF 55 to 60%.  No RWMA.  Mild LVH.  GR 1 DD.  Normal RV size and function with normal RVP.  Normal mitral and aortic valve.   UVULOPALATOPHARYNGOPLASTY, TONSILLECTOMY AND SEPTOPLASTY  2002   VIDEO BRONCHOSCOPY N/A 10/29/2022   Procedure: VIDEO BRONCHOSCOPY WITH FLUORO;  Surgeon: Alexander [name] Alexander HERO, MD;  Location: [facility] ENDOSCOPY;  Service: Pulmonary;  Laterality: N/A;    No Known Allergies  Immunization History  Administered Date(s) Administered   Fluad Quad(high Dose 65+) 01/20/2022   INFLUENZA, HIGH DOSE SEASONAL PF 03/04/2016, 02/21/2018, 02/23/2019, 03/11/2021, 02/03/2023   Influenza, Seasonal, Injecte, Preservative Fre 05/03/2012, 05/05/2014, 02/28/2015   Influenza,inj,Quad PF,6+ Mos 02/27/2013   Influenza-Unspecified 03/17/2017   PFIZER Comirnaty(Gray Top)Covid-19 Tri-Sucrose Vaccine 03/23/2020, 09/28/2020   PFIZER(Purple Top)SARS-COV-2 Vaccination 08/25/2019, 09/19/2019   Pfizer(Comirnaty)Fall Seasonal Vaccine 12 years and older 02/03/2023   Pneumococcal Conjugate-13 04/28/2015   Pneumococcal Polysaccharide-23 10/30/2016   Respiratory Syncytial Virus Vaccine,Recomb Aduvanted(Arexvy) 01/20/2022   Td 05/03/2012   Zoster Recombinant(Shingrix) 01/20/2022, 05/01/2022   Zoster, Live 06/29/2013    Family History  Problem Relation Age of Onset   Diabetes Mother    Heart disease Mother    Cancer Sister 16       male cancer   Breast cancer Brother 65   Hypertension Maternal Aunt    Cancer Maternal Uncle         spinal   Coronary artery disease Neg Hx    Stroke Neg Hx    Colon cancer Neg Hx    Pancreatic cancer Neg Hx    Stomach cancer Neg Hx    Esophageal cancer Neg Hx    Inflammatory bowel disease Neg Hx    Liver disease Neg Hx    Rectal cancer Neg Hx    Colon polyps Neg Hx      Current Outpatient Medications:    acetaminophen  (TYLENOL ) 500 MG tablet, Take 1,000 mg by mouth every 6 (six) hours as needed for moderate pain or headache. , Disp: , Rfl:    Ascorbic Acid (VITAMIN C ) 1000 MG tablet, Take 1,000 mg by mouth daily., Disp: , Rfl:    aspirin  EC 81 MG tablet, Take 1 tablet (81 mg total) by mouth daily. Swallow whole., Disp: , Rfl:    Carboxymethylcellulose Sodium (LUBRICANT EYE DROPS OP), Place 2 drops into both eyes daily as needed (for dry eyes)., Disp: , Rfl:    Carob, Locust Bean Gum, POWD, 1 Scoop by Does not apply route daily., Disp: , Rfl:    Cholecalciferol  (VITAMIN D3) 50 MCG (2000 UT) TABS, Take 2,000 Units by mouth daily., Disp: , Rfl:    fluticasone (FLONASE) 50 MCG/ACT nasal spray, Place 1-2 sprays into both nostrils daily as needed for allergies or rhinitis., Disp: , Rfl:    gabapentin  (NEURONTIN ) 600 MG tablet, TAKE 2 TABLETS BY MOUTH 3  TIMES  DAILY, Disp: 600 tablet, Rfl: 3   ibuprofen  (ADVIL ) 200 MG tablet, Take 400 mg by mouth every 6 (six) hours as needed for headache or moderate pain., Disp: , Rfl:    Magnesium  250 MG TABS, Take 1 tablet (250 mg total) by mouth at bedtime., Disp: , Rfl: 0   mirabegron  ER (MYRBETRIQ ) 50 MG TB24 tablet, Take 1 tablet (50 mg total) by mouth daily., Disp: 100 tablet, Rfl: 3   mycophenolate  (CELLCEPT ) 500 MG tablet, Take 3 tablets (1,500 mg total) by mouth 2 (two) times daily., Disp: 540 tablet, Rfl: 0   Nintedanib (OFEV ) 150 MG CAPS, Take 1 capsule (150 mg total) by mouth 2 (two) times daily., Disp: 180 capsule, Rfl: 1   omeprazole  (PRILOSEC) 40 MG capsule, Take 1 capsule (40 mg total) by mouth 2 (two) times daily before a meal.,  Disp: 200 capsule, Rfl: 3   predniSONE  (DELTASONE ) 10 MG tablet, TAKE 1 TABLET BY MOUTH DAILY  WITH BREAKFAST, Disp: 100 tablet, Rfl: 1   rosuvastatin  (CRESTOR ) 20 MG tablet, TAKE 1 TABLET BY MOUTH DAILY, Disp: 100 tablet, Rfl: 2   sulfamethoxazole -trimethoprim  (BACTRIM  DS) 800-160 MG tablet, TAKE 1 TABLET BY MOUTH 3 TIMES  WEEKLY, Disp: 43 tablet, Rfl: 2   Treprostinil (TYVASO DPI MAINTENANCE KIT) 64 MCG POWD, Inhale 64 mcg into the lungs in the morning, at noon, in the evening, and at bedtime., Disp: , Rfl:    vitamin B-12 (CYANOCOBALAMIN) 1000 MCG tablet, Take 1,000 mcg by mouth daily., Disp: , Rfl:    vitamin E 180 MG (400 UNITS) capsule, Take 400 Units by mouth daily., Disp: , Rfl:       Objective:   Vitals:   01/27/24 0856  BP: 114/62  Pulse: 81  SpO2: 95%  Weight: 194 lb (88 kg)  Height: 5' 10 (1.778 m)    Estimated body mass index is 27.84 kg/m as calculated from the following:   Height as of this encounter: 5' 10 (1.778 m).   Weight as of this encounter: 194 lb (88 kg).  @WEIGHTCHANGE @  [employer]   01/27/24 0856  Weight: 194 lb (88 kg)     Physical Exam   General: No distress. Looks happirer O2 at rest: yes but now turned off   Cane present: YES CRUTCHES Sitting in wheel chair: no Frail: no Obese: no Neuro: Alert and Oriented x 3. GCS 15. Speech normal Psych: Pleasant Resp:  Barrel Chest - no.  Wheeze - no, Crackles - ye, No overt respiratory distress CVS: Normal heart sounds. Murmurs - no Ext: Stigmata of Connective Tissue Disease - no HEENT: Normal upper airway. PEERL +. No post nasal drip        Assessment/     Assessment & Plan Interstitial lung disease (HCC)  Hypersensitivity pneumonitis (HCC)  Encounter for therapeutic drug monitoring  Long-term use of high-risk medication  Need for pneumocystis prophylaxis  Exercise hypoxemia  WHO group 3 pulmonary arterial hypertension (HCC)    PLAN Patient Instructions   #Interstitial  lung disease due to HP #Encounter Therapeutic monitoring #High risk medication use with PJP prophylasix #Chronic steroids  - Slowly progressive  over time though clinically stable 01/27/2024 versus spring 2025 esp after staring tyvaso  - on max medical therapy  Plan  - check cbc, bmet, lft  - continue cellcept  - continue prednisone  10mg  per day (taper in future)  - continue ofev  - continue bactirim    #Diarrhea: This is due to nintedanib on top  of CellCept   - much better after CAROB  Plan - continue CAROB  -Do spirometry and DLCO in 4 months  #WHO-3 Pulmonary Hypertension - new dx June/July 2025  - on tyvaso since July 2025 and helping  Plan  - continue tyvso 64mcg DPI  Follow-up - 16-week 30-minute visit with Dr. Geronimo but after spirometry and DLCO    FOLLOWUP    No follow-ups on file.    SIGNATURE    Dr. Dorethia Alexander, M.D., F.C.C.P,  Pulmonary and Critical Care Medicine Staff Physician, [facility] Director - Interstitial Lung Disease  Program  Pulmonary Fibrosis [facility] [place], [place], [address]  Pager: [phone], If no answer or between  15:00h - 7:00h: call [phone] Telephone: [phone]  9:37 AM 01/27/2024   HIGh Complexity  OFFICE   2021 E/M guidelines, first released in 2021, with minor revisions added in 2023. Must meet the requirements for 2 out of 3 dimensions to qualify.    Number and complexity of problems addressed Amount and/or complexity of data reviewed Risk of complications and/or morbidity  Severe exacerbation of chronic illness  Acute or chronic illnesses that may pose a threat to life or bodily function, e.g., multiple trauma, acute MI, pulmonary embolus, severe respiratory distress, progressive rheumatoid arthritis, psychiatric illness with potential threat to self or others, peritonitis, acute renal failure, abrupt change in neurological status Must meet  the requirements for 2 of 3 of the categories)  Category 1: Tests and documents, historian  Any combination of 3 of the following:  Assessment requiring an independent historian  Review of prior external note(s) from each unique source  Review of results of each unique test  Ordering of each unique test    Category 2: Interpretation of tests    Independent interpretation of a test performed by another physician/other qualified health care professional (not separately reported)  Category 3: Discuss management/tests  Discussion of management or test interpretation with external physician/other qualified health care professional/appropriate source (not separately reported)  HIGH risk of morbidity from additional diagnostic testing or treatment Examples only:  Drug therapy requiring intensive monitoring for toxicity  Decision for elective major surgery with identified pateint or procedure risk factors  Decision regarding hospitalization or escalation of level of care  Decision for DNR or to de-escalate care   Parenteral controlled  substances

## 2024-02-01 ENCOUNTER — Telehealth: Payer: Self-pay

## 2024-02-01 NOTE — Progress Notes (Signed)
 Attending Physician's Attestation   I have reviewed the chart.   I agree with the Advanced Practitioner's note, impression, and recommendations with any updates as below. We can move forward with scheduling his colonoscopy.  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  If he wants to hold on an upper endoscopy because he is doing quite well as is outlined, then that is okay to.    Aloha Finner, MD [facility] Advanced Endoscopy Office # [phone]

## 2024-02-01 NOTE — Telephone Encounter (Signed)
 Author: Mansouraty, Aloha Raddle., MD Service: Gastroenterology Author Type: Physician  Filed: 02/01/2024  9:31 AM Encounter Date: 01/25/2024 Status: Signed  Editor: Mansouraty, Aloha Raddle., MD (Physician)    Attending Physician's Attestation    I have reviewed the chart.    I agree with the Advanced Practitioner's note, impression, and recommendations with any updates as below. We can move forward with scheduling his colonoscopy.  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  If he wants to hold on an upper endoscopy because he is doing quite well as is outlined, then that is okay to.      Aloha Finner, MD [facility] Advanced Endoscopy Office # [phone]

## 2024-02-03 ENCOUNTER — Other Ambulatory Visit: Payer: Self-pay

## 2024-02-03 DIAGNOSIS — K315 Obstruction of duodenum: Secondary | ICD-10-CM

## 2024-02-03 DIAGNOSIS — Z860100 Personal history of colon polyps, unspecified: Secondary | ICD-10-CM

## 2024-02-03 MED ORDER — NA SULFATE-K SULFATE-MG SULF 17.5-3.13-1.6 GM/177ML PO SOLN: 1.0000 | Freq: Once | ORAL | 0 refills | Status: AC

## 2024-02-03 NOTE — Telephone Encounter (Signed)
 Endo Colon scheduled, pt instructed via My Chart and using interpreter and medications reviewed.  Patient instructions mailed to home.  Patient to call with any questions or concerns.

## 2024-02-03 NOTE — Telephone Encounter (Signed)
 Endo colon has been set up for 10/22 at 845 am at [facility] with GM

## 2024-02-10 ENCOUNTER — Ambulatory Visit: Attending: Cardiology | Admitting: Cardiology

## 2024-02-10 VITALS — BP 102/70 | HR 97 | Resp 20 | Ht 70.0 in | Wt 194.0 lb

## 2024-02-10 DIAGNOSIS — Z9861 Coronary angioplasty status: Secondary | ICD-10-CM

## 2024-02-10 DIAGNOSIS — E785 Hyperlipidemia, unspecified: Secondary | ICD-10-CM

## 2024-02-10 DIAGNOSIS — I10 Essential (primary) hypertension: Secondary | ICD-10-CM | POA: Diagnosis not present

## 2024-02-10 DIAGNOSIS — Z8679 Personal history of other diseases of the circulatory system: Secondary | ICD-10-CM | POA: Diagnosis not present

## 2024-02-10 DIAGNOSIS — I2510 Atherosclerotic heart disease of native coronary artery without angina pectoris: Secondary | ICD-10-CM | POA: Diagnosis not present

## 2024-02-10 DIAGNOSIS — I252 Old myocardial infarction: Secondary | ICD-10-CM

## 2024-02-10 DIAGNOSIS — J9611 Chronic respiratory failure with hypoxia: Secondary | ICD-10-CM

## 2024-02-10 NOTE — Patient Instructions (Signed)
 Medication Instructions:  Your physician recommends that you continue on your current medications as directed. Please refer to the Current Medication list given to you today.   *If you need a refill on your cardiac medications before your next appointment, please call your pharmacy*  Lab Work: None ordered at this time  If you have labs (blood work) drawn today and your tests are completely normal, you will receive your results only by: MyChart Message (if you have MyChart) OR A paper copy in the mail If you have any lab test that is abnormal or we need to change your treatment, we will call you to review the results.  Testing/Procedures: None ordered at this time   Follow-Up: At [facility], you and your health needs are our priority.  As part of our continuing mission to provide you with exceptional heart care, our providers are all part of one team.  This team includes your primary Cardiologist (physician) and Advanced Practice Providers or APPs (Physician Assistants and Nurse Practitioners) who all work together to provide you with the care you need, when you need it.  Your next appointment:   1 year(s)  Provider:   You may see Alm Clay, MD or one of the following Advanced Practice Providers on your designated Care Team:   Lonni Meager, NP Lesley Maffucci, PA-C Bernardino Bring, PA-C Cadence Brandon, PA-C Tylene Lunch, NP Barnie Hila, NP    We recommend signing up for the patient portal called MyChart.  Sign up information is provided on this After Visit Summary.  MyChart is used to connect with patients for Virtual Visits (Telemedicine).  Patients are able to view lab/test results, encounter notes, upcoming appointments, etc.  Non-urgent messages can be sent to your provider as well.   To learn more about what you can do with MyChart, go to [email or web address].

## 2024-02-10 NOTE — Progress Notes (Signed)
 Cardiology Office Note:  .   Date:  02/14/2024  ID:  Alexander [name], DOB Apr 27, 1951, MRN [id number] PCP: Alexander Baller, MD  [facility] Providers Cardiologist:  Alexander Clay, MD     Chief Complaint  Patient presents with   Follow-up    Major issue is shortness of breath from lungs   Coronary Artery Disease    No angina    Patient Profile: .     Alexander [name] is a 73 y.o. male  with a PMH noted below who presents here for 3 month And post-cath f/u at the request of Alexander Baller, MD.  PMH: Cardiac  CAD: 1995 s/p BMS;  b. 2000 s/p BMS;  c. 2005 s/p DES- All stents in [facility] (RCA, LAD & OM - unknown on which date);   04/2015: Type II MI in setting of  cholecystitis and sepsis) Cath (in the setting of: LM nl, ost/p LAD 20%, patent mLAD stent, RI small, OM2 patent stent, pRCA 20%, patent stent,>Med Rx.  (See below) Mild ICM-EF 45 to 50% - resolved; Echo EF 07/2021 55-60% PAROXYSMAL ATRIAL FIBRILLATION - 04/2015-in the setting of cholecystitis and sepsis HTN, HLD => troubled by orthostatic hypotension and bradycardia Mild OSA-not on CPAP PAF-during sepsis History of Polio with Post-Polio Syndrome/neuropathy- - Bilateral Leg involvement -> walks with ankle to knee braces for support and bilateral hand crutches Chronic Hepatobiliary Disease with recurrent choledocholithiasis /recurrent cholangitis-status postcholecystectomy with several biliary stent placements.       Alexander [name] was last seen on 11/10/2023 by Alexander Meager, NP for Pre-Cath evaluation for a RHC requested by Alexander [name] (Alexander [name]).  Despite progressive dyspnea, he does not experience angina. He sometimes notes a fleeting chest pain which she describes as feeling like electrical current, which has been intermittently present for a long time. He remains on statin therapy. He used to take aspirin  81 mg daily but says he stopped it at some point along the way. Encouraged him to resume given his history.   He is euvolemic on examination and does not require diuretic therapy. Heart rate and blood pressure stable.   Subjective  Discussed the use of AI scribe software for clinical note transcription with the patient, who gave verbal consent to proceed.  History of Present Illness Alexander [name] is a 73 year old male with coronary artery disease who presents with low energy and breathing difficulties.  He experiences low energy and difficulty breathing, describing it as 'breathing hard.' Some days are better than others, but today is particularly bad. He uses a CPAP machine with a full mouth mask at night and occasionally during the day, which helps increase his oxygen  levels to 96-97%.  No current chest pain or heart racing, but he feels anxious at times, causing his heart to beat faster. These episodes last about a minute and resolve with relaxation. He uses a pulse oximeter to monitor his oxygen  levels and pulse, noting that exertion can cause his pulse to rise above 100 and his oxygen  levels to drop.  He experiences occasional coughing fits with mucus production, leading to difficulty breathing and a drop in oxygen  saturation to the 80s or 70s. During these episodes, he finds it difficult to use his oxygen  equipment due to mucus obstruction but manages to recover by resting and using his CPAP.  He has a history of atrial fibrillation but has not experienced any episodes recently. He was previously on Eliquis  but discontinued it six months after a  hospitalization for sepsis. He has not had any breakthrough episodes since stopping the medication.  He is currently taking aspirin  and Crestor , with the Crestor  dose reduced to 20 mg due to muscle aches and fatigue. He is not on any blood pressure medications and notes that his blood pressure was low today, which was surprising to him.  He describes finger symptoms, including shaking and tightness, which he refers to as 'getting busted by a ball,'  affecting his fingers.  He has a history of post-polio syndrome, impacting his mobility. He uses braces and is active in daily activities, including helping with his grandchildren.  Cardiovascular ROS: positive for - dyspnea on exertion, rapid heart rate, and shortness of breath negative for - chest pain, edema, irregular heartbeat, orthopnea, palpitations, paroxysmal nocturnal dyspnea, or syncope or near syncope, TIA or amaurosis fugax  ROS:  Review of Systems - Negative except symptoms noted above    Objective   Pertinent Cardiac Medications: Aspirin  81 mg daily, Crestor  20 mg daily Pulmonary medicines: Tyvaso 64 mcg inhaler, prednisone  10 mg daily, Ofev  150 mg twice daily CellCept  500 mg tabs-3 tabs twice daily (50 mg) Flonase 1 to 2 spray PRN  Studies Reviewed: SABRA   EKG Interpretation Date/Time:  Thursday February 10 2024 10:18:13 EDT Ventricular Rate:  86 PR Interval:  170 QRS Duration:  142 QT Interval:  416 QTC Calculation: 497 R Axis:   0  Text Interpretation: Normal sinus rhythm Right bundle branch block Septal infarct , age undetermined When compared with ECG of 10-Nov-2023 09:39, QRS duration has increased Confirmed by Alexander [name] ([id number]) on 02/10/2024 10:53:10 AM    Lab Results  Component Value Date   CHOL 130 11/10/2023   HDL 65 11/10/2023   LDLCALC 47 11/10/2023   LDLDIRECT 213.1 06/29/2013   TRIG 94 11/10/2023   CHOLHDL 2.0 11/10/2023   Results DIAGNOSTIC Echocardiogram: EF 60-65%, no wall motion abnormality, grade 1 diastolic dysfunction, mildly reduced right ventricular systolic function, mildly elevated pulmonary pressures, normal right atrial pressure, calcium  on aortic valve (04/22/2023) Right Heart Catheterization: Borderline Pulmonary Hypertension: PAP-mean 35/17-24 mmHg. PCWP ~10 mmHg. Ao sat 95% W PA sat 66%: Cardiac Output-Index by Fick 5. 0 5-2.43 (11/11/2023)  Pressures     Saturations Phases Resting  Phases Resting  Right   PA  % 66    RA Mean   mmHg 8  Arterial  % 95    RA A-Wave  mmHg 10       RA V-Wave  mmHg 9     Pulmonary        PA  mmHg 35/17 (24)       PCW Mean  mmHg 9.0       PCW A-Wave  mmHg 12.0       PCW V-Wave  mmHg 11.0       PAPi   2.3       =================================================================================  CAD: 1995 s/p BMS; b. 2000 s/p BMS; c. 2005 s/p stent - All stents in [facility] (RCA, LAD & OM - unknown on which date); NonSTEMI 05/2015 Cardiac Cath 04/30/2015: LM nl, ost/p LAD 20%, patent mLAD stent, RI small, OM2 patent stent, pRCA 20%, patent stent, EF 45-50%-->Med Rx    Risk Assessment/Calculations:    CHA2DS2-VASc Score = 3   This indicates a 3.2% annual risk of stroke. [name] score is based upon: CHF History: 0 HTN History: 1 Diabetes History: 0 Stroke History: 0 Vascular Disease History: 1 Age Score: 1 Gender Score:  0        Physical Exam:   VS:  BP 102/70 (BP Location: Left Arm, Patient Position: Sitting, Cuff Size: Normal)   Pulse 97   Resp 20   Ht 5' 10 (1.778 m)   Wt 194 lb (88 kg)   SpO2 90%   BMI 27.84 kg/m    Wt Readings from Last 3 Encounters:  02/10/24 194 lb (88 kg)  01/27/24 194 lb (88 kg)  01/25/24 194 lb (88 kg)    Physical Exam VITALS: BP- 102/70 MEASUREMENTS: Weight- 194. MUSCULOSKELETAL: Very soft aortic murmur. No jugular venous distention. PULMONARY: Crackles heard on auscultation.   GEN: Well nourished, well groomed in no acute distress; robust appearing torso and shoulders.  NECK: No JVD; No carotid bruits CARDIAC: Normal S1, S2; RRR, no murmurs, rubs, gallops RESPIRATORY: Diffuse bibasal dry crackles without rhonchi or rales.  No wheezes.  Nonlabored. ABDOMEN: Soft, non-tender, non-distended EXTREMITIES:  No edema;;Both legs in braces.  Walks with bilateral crutches    ASSESSMENT AND PLAN: .    Problem List Items Addressed This Visit       Cardiology Problems   CAD S/P percutaneous coronary angioplasty (Chronic)    Coronary artery disease with myocardial infarction and stents in three vessels. No current chest pain or angina. Last heart catheterization in 2016 showed open stents. Blood pressure is low but asymptomatic.  Due to low blood pressures, not on beta-blocker or ACE inhibitor/ARB. - Continue aspirin  81 mg and Crestor  40 mg. - Monitor blood pressure and symptoms; maintain current management as long as no dizziness or syncope occurs.      Essential hypertension - Primary (Chronic)   No longer hypertensive.  Off of all medications.      Relevant Orders   EKG 12-Lead (Completed)   Hyperlipidemia with target LDL less than 70 (Chronic)   Most recent lipids in June had excellent LDL level on current dose of rosuvastatin  20 mg daily  Continue to follow-up labs with PCP.  No change in management.        Other   Chronic respiratory failure with hypoxia (HCC) (Chronic)   Related to underlying lung disease and OSA.  He does use nasal cannula oxygen  and bleed in oxygen  with CPAP.  Every now and then he wears out and uses CPAP.  Chronic dyspnea managed with CPAP and supplemental oxygen .  Reports episodes of low oxygen  saturation, particularly during exertion and coughing fits, with levels dropping to the 80s and 70s. Utilizes CPAP during the day to improve oxygenation, which raises levels to 96-97%. - Continue CPAP therapy with supplemental oxygen  as needed. - Advise using CPAP during the day if experiencing low oxygen  levels. - Instruct to use CPAP to bring oxygen  levels up during episodes of shortness of breath.      History of atrial fibrillation (Chronic)   No recent atrial fibrillation since his hospitalization for sepsis.  Was likely opportunistic. With no recurrence and concerns for falls, we discussed the risks of stroke and with shared decision making decided to stay with aspirin .      History of myocardial infarction due to demand ischemia (Chronic)   Distant history of non-STEMI, but  recent demand ischemia/infarct in setting of sepsis.  Likely sepsis with existing disease. Echo now shows EF back to 60 to 65%.       Post-polio syndrome with underlying weakness. Emphasized the importance of maintaining activity to prevent further decline in function. - Encourage regular physical activity, even if  it requires frequent rest breaks. - Advise to continue daily activities and avoid prolonged periods of inactivity.         Follow-Up: Return in about 1 year (around 02/09/2025).     Signed, Alexander MICAEL Clay, MD, MS Alexander [name], M.D., M.S. Interventional Cardiologist  [facility] Pager # [phone]

## 2024-02-14 ENCOUNTER — Encounter: Payer: Self-pay | Admitting: Cardiology

## 2024-02-14 DIAGNOSIS — J9611 Chronic respiratory failure with hypoxia: Secondary | ICD-10-CM | POA: Insufficient documentation

## 2024-02-14 NOTE — Assessment & Plan Note (Signed)
 No recent atrial fibrillation since his hospitalization for sepsis.  Was likely opportunistic. With no recurrence and concerns for falls, we discussed the risks of stroke and with shared decision making decided to stay with aspirin .

## 2024-02-14 NOTE — Assessment & Plan Note (Signed)
 Coronary artery disease with myocardial infarction and stents in three vessels. No current chest pain or angina. Last heart catheterization in 2016 showed open stents. Blood pressure is low but asymptomatic.  Due to low blood pressures, not on beta-blocker or ACE inhibitor/ARB. - Continue aspirin  81 mg and Crestor  40 mg. - Monitor blood pressure and symptoms; maintain current management as long as no dizziness or syncope occurs.

## 2024-02-14 NOTE — Assessment & Plan Note (Signed)
 Distant history of non-STEMI, but recent demand ischemia/infarct in setting of sepsis.  Likely sepsis with existing disease. Echo now shows EF back to 60 to 65%.

## 2024-02-14 NOTE — Assessment & Plan Note (Signed)
 No longer hypertensive.  Off of all medications.

## 2024-02-14 NOTE — Assessment & Plan Note (Signed)
 Most recent lipids in June had excellent LDL level on current dose of rosuvastatin  20 mg daily  Continue to follow-up labs with PCP.  No change in management.

## 2024-02-14 NOTE — Assessment & Plan Note (Signed)
 Related to underlying lung disease and OSA.  He does use nasal cannula oxygen  and bleed in oxygen  with CPAP.  Every now and then he wears out and uses CPAP.  Chronic dyspnea managed with CPAP and supplemental oxygen .  Reports episodes of low oxygen  saturation, particularly during exertion and coughing fits, with levels dropping to the 80s and 70s. Utilizes CPAP during the day to improve oxygenation, which raises levels to 96-97%. - Continue CPAP therapy with supplemental oxygen  as needed. - Advise using CPAP during the day if experiencing low oxygen  levels. - Instruct to use CPAP to bring oxygen  levels up during episodes of shortness of breath.

## 2024-02-21 ENCOUNTER — Other Ambulatory Visit: Payer: Self-pay | Admitting: Internal Medicine

## 2024-02-21 DIAGNOSIS — J679 Hypersensitivity pneumonitis due to unspecified organic dust: Secondary | ICD-10-CM

## 2024-02-21 DIAGNOSIS — J849 Interstitial pulmonary disease, unspecified: Secondary | ICD-10-CM

## 2024-02-21 NOTE — Telephone Encounter (Signed)
 Refill sent for OFEV  to [facility]: [phone]   Dose: 150mg  every 12 hours   Last OV: 01/27/24 Provider: Dr. Geronimo Pertinent labs: LFTs stable 01/27/24  Next OV: due after spirometry and DLCO; PFTs scheduled 06/07/24  Aleck Puls, PharmD, BCPS Clinical Pharmacist  [facility]

## 2024-02-21 NOTE — Telephone Encounter (Signed)
 Pt requesting refill of specialty medication. Routing to Rx team.

## 2024-02-24 ENCOUNTER — Encounter: Payer: Self-pay | Admitting: Family Medicine

## 2024-03-13 ENCOUNTER — Other Ambulatory Visit: Payer: Self-pay | Admitting: Internal Medicine

## 2024-03-13 DIAGNOSIS — J679 Hypersensitivity pneumonitis due to unspecified organic dust: Secondary | ICD-10-CM

## 2024-03-13 DIAGNOSIS — J849 Interstitial pulmonary disease, unspecified: Secondary | ICD-10-CM

## 2024-03-15 ENCOUNTER — Encounter (HOSPITAL_COMMUNITY): Payer: Self-pay | Admitting: Gastroenterology

## 2024-03-15 NOTE — Progress Notes (Signed)
 Attempted to obtain medical history for pre op call via telephone, unable to reach at this time. HIPAA compliant voicemail message left requesting return call to pre surgical testing department.

## 2024-03-20 ENCOUNTER — Telehealth: Payer: Self-pay

## 2024-03-20 NOTE — Telephone Encounter (Signed)
 Procedure:COLON Procedure date: 03/22/24 Procedure location: [facility] Arrival Time: 7:23 Spoke with the patient Y/N: Y Any prep concerns? N  Has the patient obtained the prep from the pharmacy ? Y Do you have a care partner and transportation: Y Any additional concerns? N

## 2024-03-22 ENCOUNTER — Other Ambulatory Visit: Payer: Self-pay

## 2024-03-22 ENCOUNTER — Ambulatory Visit (HOSPITAL_COMMUNITY): Admitting: Certified Registered Nurse Anesthetist

## 2024-03-22 ENCOUNTER — Ambulatory Visit (HOSPITAL_COMMUNITY)
Admission: RE | Admit: 2024-03-22 | Discharge: 2024-03-22 | Disposition: A | Discharge status: Home / Self Care | Attending: Gastroenterology | Admitting: Gastroenterology

## 2024-03-22 ENCOUNTER — Encounter (HOSPITAL_COMMUNITY)
Admission: RE | Disposition: A | Discharge status: Home / Self Care | Payer: Self-pay | Source: Home / Self Care | Attending: Gastroenterology

## 2024-03-22 ENCOUNTER — Encounter (HOSPITAL_COMMUNITY): Payer: Self-pay | Admitting: Gastroenterology

## 2024-03-22 DIAGNOSIS — K644 Residual hemorrhoidal skin tags: Secondary | ICD-10-CM | POA: Insufficient documentation

## 2024-03-22 DIAGNOSIS — R1010 Upper abdominal pain, unspecified: Secondary | ICD-10-CM | POA: Insufficient documentation

## 2024-03-22 DIAGNOSIS — Z87891 Personal history of nicotine dependence: Secondary | ICD-10-CM | POA: Insufficient documentation

## 2024-03-22 DIAGNOSIS — K5520 Angiodysplasia of colon without hemorrhage: Secondary | ICD-10-CM | POA: Diagnosis not present

## 2024-03-22 DIAGNOSIS — K315 Obstruction of duodenum: Secondary | ICD-10-CM | POA: Insufficient documentation

## 2024-03-22 DIAGNOSIS — M1990 Unspecified osteoarthritis, unspecified site: Secondary | ICD-10-CM | POA: Insufficient documentation

## 2024-03-22 DIAGNOSIS — Z860101 Personal history of adenomatous and serrated colon polyps: Secondary | ICD-10-CM

## 2024-03-22 DIAGNOSIS — K31819 Angiodysplasia of stomach and duodenum without bleeding: Secondary | ICD-10-CM | POA: Insufficient documentation

## 2024-03-22 DIAGNOSIS — K562 Volvulus: Secondary | ICD-10-CM | POA: Diagnosis not present

## 2024-03-22 DIAGNOSIS — K2289 Other specified disease of esophagus: Secondary | ICD-10-CM | POA: Diagnosis not present

## 2024-03-22 DIAGNOSIS — I5032 Chronic diastolic (congestive) heart failure: Secondary | ICD-10-CM | POA: Insufficient documentation

## 2024-03-22 DIAGNOSIS — K641 Second degree hemorrhoids: Secondary | ICD-10-CM | POA: Diagnosis not present

## 2024-03-22 DIAGNOSIS — I2510 Atherosclerotic heart disease of native coronary artery without angina pectoris: Secondary | ICD-10-CM | POA: Insufficient documentation

## 2024-03-22 DIAGNOSIS — K219 Gastro-esophageal reflux disease without esophagitis: Secondary | ICD-10-CM | POA: Diagnosis not present

## 2024-03-22 DIAGNOSIS — K3189 Other diseases of stomach and duodenum: Secondary | ICD-10-CM | POA: Diagnosis not present

## 2024-03-22 DIAGNOSIS — G4733 Obstructive sleep apnea (adult) (pediatric): Secondary | ICD-10-CM | POA: Diagnosis not present

## 2024-03-22 DIAGNOSIS — I10 Essential (primary) hypertension: Secondary | ICD-10-CM | POA: Diagnosis not present

## 2024-03-22 DIAGNOSIS — Z1211 Encounter for screening for malignant neoplasm of colon: Secondary | ICD-10-CM | POA: Diagnosis not present

## 2024-03-22 DIAGNOSIS — Z860100 Personal history of colon polyps, unspecified: Secondary | ICD-10-CM

## 2024-03-22 DIAGNOSIS — K2970 Gastritis, unspecified, without bleeding: Secondary | ICD-10-CM

## 2024-03-22 DIAGNOSIS — R197 Diarrhea, unspecified: Secondary | ICD-10-CM | POA: Diagnosis not present

## 2024-03-22 DIAGNOSIS — I110 Hypertensive heart disease with heart failure: Secondary | ICD-10-CM | POA: Insufficient documentation

## 2024-03-22 HISTORY — PX: COLONOSCOPY: SHX5424

## 2024-03-22 HISTORY — PX: ESOPHAGOGASTRODUODENOSCOPY: SHX5428

## 2024-03-22 SURGERY — COLONOSCOPY
Anesthesia: Monitor Anesthesia Care

## 2024-03-22 MED ORDER — SODIUM CHLORIDE 0.9 % IV SOLN: INTRAVENOUS | Status: DC

## 2024-03-22 MED ORDER — PROPOFOL 10 MG/ML IV BOLUS
INTRAVENOUS | Status: DC | PRN
  Administered 2024-03-22: 20 mg via INTRAVENOUS

## 2024-03-22 MED ORDER — PROPOFOL 500 MG/50ML IV EMUL
INTRAVENOUS | Status: DC | PRN
  Administered 2024-03-22: 150 ug/kg/min via INTRAVENOUS

## 2024-03-22 MED ORDER — PROPOFOL 1000 MG/100ML IV EMUL
INTRAVENOUS | Status: AC
  Filled 2024-03-22: qty 100

## 2024-03-22 MED ORDER — LIDOCAINE 2% (20 MG/ML) 5 ML SYRINGE
INTRAMUSCULAR | Status: DC | PRN
  Administered 2024-03-22: 60 mg via INTRAVENOUS

## 2024-03-22 MED ORDER — ONDANSETRON HCL 4 MG/2ML IJ SOLN
INTRAMUSCULAR | Status: DC | PRN
  Administered 2024-03-22: 4 mg via INTRAVENOUS

## 2024-03-22 NOTE — Anesthesia Procedure Notes (Signed)
 Procedure Name: MAC Date/Time: 03/22/2024 9:24 AM  Performed by: Zulema Leita PARAS, CRNAPre-anesthesia Checklist: Patient identified, Emergency Drugs available, Suction available and Patient being monitored Oxygen  Delivery Method: Simple face mask

## 2024-03-22 NOTE — Anesthesia Postprocedure Evaluation (Signed)
 Anesthesia Post Note  Patient: Alexander [name]  Procedure(s) Performed: COLONOSCOPY EGD (ESOPHAGOGASTRODUODENOSCOPY)     Patient location during evaluation: PACU Anesthesia Type: MAC Level of consciousness: awake and alert Pain management: pain level controlled Vital Signs Assessment: post-procedure vital signs reviewed and stable Respiratory status: spontaneous breathing, nonlabored ventilation, respiratory function stable and patient connected to nasal cannula oxygen  Cardiovascular status: stable and blood pressure returned to baseline Postop Assessment: no apparent nausea or vomiting Anesthetic complications: no   No notable events documented.  Last Vitals:  Vitals:   03/22/24 1030 03/22/24 1040  BP: 120/82 133/83  Pulse: 78 71  Resp: (!) 23 17  Temp:    SpO2: 97% 99%    Last Pain:  Vitals:   03/22/24 1030  TempSrc:   PainSc: 0-No pain                 Cordella P Marqueta Pulley

## 2024-03-22 NOTE — Op Note (Signed)
 [facility] Patient Name: Alexander [name] Procedure Date: 03/22/2024 MRN: [id number] Attending MD: Aloha Finner , MD, [id number] Date of Birth: [birth year] CSN: [id number] Age: 73 Admit Type: Outpatient Procedure:                Colonoscopy Indications:              High risk colon cancer surveillance: Personal                            history of colonic polyps, Incidental diarrhea noted Providers:                Aloha Finner, MD, Willy Hummer, RN, Corene Southgate, Technician Referring MD:              Medicines:                Monitored Anesthesia Care Complications:            No immediate complications. Estimated Blood Loss:     Estimated blood loss was minimal. Procedure:                Pre-Anesthesia Assessment:                           - Prior to the procedure, a History and Physical                            was performed, and patient medications and                            allergies were reviewed. [name] tolerance of                            previous anesthesia was also reviewed. The risks                            and benefits of the procedure and the sedation                            options and risks were discussed with the patient.                            All questions were answered, and informed consent                            was obtained. Prior Anticoagulants: The patient has                            taken no anticoagulant or antiplatelet agents                            except for aspirin . ASA Grade Assessment: III - A  patient with severe systemic disease. After                            reviewing the risks and benefits, the patient was                            deemed in satisfactory condition to undergo the                            procedure.                           After obtaining informed consent, the colonoscope                            was passed  under direct vision. Throughout the                            procedure, the patient's blood pressure, pulse, and                            oxygen  saturations were monitored continuously. The                            [id number]-HQ190L ([id number] Olympus colonoscope was                            introduced through the anus and advanced to the the                            cecum, identified by appendiceal orifice and                            ileocecal valve. The colonoscopy was performed                            without difficulty. The patient tolerated the                            procedure. The quality of the bowel preparation was                            adequate. The ileocecal valve, appendiceal orifice,                            and rectum were photographed. Scope In: 9:48:20 AM Scope Out: 10:02:45 AM Scope Withdrawal Time: 0 hours 8 minutes 47 seconds  Total Procedure Duration: 0 hours 14 minutes 25 seconds  Findings:      The digital rectal exam findings include hemorrhoids. Pertinent       negatives include no palpable rectal lesions.      The colon (entire examined portion) revealed significantly excessive       looping.      A single large angioectasia with typical arborization was found in the       cecum.  Normal mucosa was found in the entire colon otherwise. Biopsies for       histology were taken with a cold forceps from the entire colon for       evaluation of microscopic colitis.      Non-bleeding non-thrombosed external and internal hemorrhoids were found       during retroflexion, during perianal exam and during digital exam. The       hemorrhoids were Grade II (internal hemorrhoids that prolapse but reduce       spontaneously). Impression:               - Hemorrhoids found on digital rectal exam.                           - There was significant looping of the colon.                           - Normal mucosa in the entire examined colon.                             Biopsied.                           - Non-bleeding non-thrombosed external and internal                            hemorrhoids. Moderate Sedation:      Not Applicable - Patient had care per Anesthesia. Recommendation:           - The patient will be observed post-procedure,                            until all discharge criteria are met.                           - Discharge patient to home.                           - Patient has a contact number available for                            emergencies. The signs and symptoms of potential                            delayed complications were discussed with the                            patient. Return to normal activities tomorrow.                            Written discharge instructions were provided to the                            patient.                           - Resume previous diet.                           -  Await pathology results.                           - Consider Cholestyramine if diarrhea issues                            continue to be an issue and current therapies are                            not helpful.                           - Continue present medications otherwise.                           - He would not need another colonoscopy for up to 7                            years with prior history, but with medical                            comorbidities, it is not clear that he will require                            any other procedures moving forward.                           - The findings and recommendations were discussed                            with the patient.                           - The findings and recommendations were discussed                            with the patient's family. Procedure Code(s):        --- Professional ---                           [phone], Colonoscopy, flexible; with biopsy, single                            or multiple Diagnosis Code(s):        --- Professional  ---                           Z86.010, Personal history of colonic polyps                           K64.1, Second degree hemorrhoids CPT copyright 2022 [employer]. All rights reserved. The codes documented in this report are preliminary and upon coder review may  be revised to meet current compliance requirements. Aloha Finner, MD 03/22/2024 10:22:04 AM Number of Addenda: 0

## 2024-03-22 NOTE — Transfer of Care (Signed)
 Immediate Anesthesia Transfer of Care Note  Patient: Alexander [name]  Procedure(s) Performed: COLONOSCOPY EGD (ESOPHAGOGASTRODUODENOSCOPY)  Patient Location: PACU and Endoscopy Unit  Anesthesia Type:MAC  Level of Consciousness: drowsy  Airway & Oxygen  Therapy: Patient Spontanous Breathing and Patient connected to face mask oxygen   Post-op Assessment: Report given to RN and Post -op Vital signs reviewed and stable  Post vital signs: Reviewed and stable  Last Vitals:  Vitals Value Taken Time  BP    Temp    Pulse 73 03/22/24 10:10  Resp 12 03/22/24 10:10  SpO2 97 % 03/22/24 10:10  Vitals shown include unfiled device data.  Last Pain:  Vitals:   03/22/24 0806  TempSrc: Temporal  PainSc: 0-No pain         Complications: No notable events documented.

## 2024-03-22 NOTE — H&P (Addendum)
 GASTROENTEROLOGY PROCEDURE H&P NOTE   Primary Care Physician: Rilla Baller, MD  HPI: Alexander [name] is a 73 y.o. male who presents for Colonoscopy for surveillance of previous adenomas and for EGD for evaluation of Upper GI pain and previous history of duodenal strictures requiring dilations.  Past Medical History:  Diagnosis Date   A-fib (HCC)    2016 No issues after 25month on meds   Acute cholecystitis 03/2016   with sepsis   Arthritis    CAD S/P percutaneous coronary angioplasty 1995, 2000, 2005   a. 1995 s/p BMS;  b. 2000 s/p BMS;  c. 2005 s/p stent - All stents in [facility] (RCA, LAD & OM - unknown on which date);  d. 04/2015 NSTEMI/Cath: LM nl, ost/p LAD 20%, patent mLAD stent, RI small, OM2 patent stent, pRCA 20%, patent stent, EF 45-50%-->Med Rx.   Calculus of bile duct with acute cholangitis with obstruction    Chronic pain syndrome    a. Followed @ [facility];  b. Uses 12-14 excedrins per day.   Complication of anesthesia 04/01/2021   agiated- battling nurses   Duodenal stricture    Essential hypertension    Fatty liver    Gallstones    GERD (gastroesophageal reflux disease)    Heart failure with improved ejection fraction (HFimpEF) (HCC)    a. 04/2015 Echo: EF 40-45%; b. 04/2023 Echo: EF of 60 to 65% with grade 1 diastolic dysfunction, mildly reduced RV function, RVSP of 43.2 mmHg, mild to moderate AI, and aortic sclerosis without stenosis.   History of depression    History of post poliomyelitis muscular atrophy    HLD (hyperlipidemia)    Ischemic cardiomyopathy    a. 04/2015 Echo: EF 40-45%, mod antsept, ant, antlat, apical HK, mild AI/MR, PASP ; b. 04/2023 Echo: EF of 60 to 65% with grade 1 diastolic dysfunction, mildly reduced RV function, RVSP of 43.2 mmHg, mild to moderate AI, and aortic sclerosis without stenosis   Migraines    Migraines    Muscle weakness following poliomyelitis    a. ambulates with braces.   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 04/2015 in setting of cholecystits and sepsis -->Amio;  b. CHA2DS2VASc = 4--> Eliquis .   Pneumonia 04/2015   Polio    Pulmonary fibrosis (HCC)    diagnosed 10/24   Pulmonary hypertension (HCC)    a. 04/2023 Echo: RVSP 43.2 mmHg.   Septic shock (HCC) 01/07/2018   Shoulder pain, right    Sleep apnea    Past Surgical History:  Procedure Laterality Date   BALLOON DILATION N/A 06/06/2018   Procedure: BALLOON DILATION;  Surgeon: Wilhelmenia Aloha Raddle., MD;  Location: [facility] ENDOSCOPY;  Service: Gastroenterology;  Laterality: N/A;   BALLOON DILATION N/A 07/06/2018   Procedure: BALLOON DILATION;  Surgeon: Wilhelmenia Aloha Raddle., MD;  Location: [facility] ENDOSCOPY;  Service: Gastroenterology;  Laterality: N/A;   BALLOON DILATION N/A 07/24/2021   Procedure: BALLOON DILATION;  Surgeon: Wilhelmenia Aloha Raddle., MD;  Location: [facility] ENDOSCOPY;  Service: Gastroenterology;  Laterality: N/A;   BILIARY BRUSHING  06/05/2021   Procedure: BILIARY BRUSHING;  Surgeon: Wilhelmenia Aloha Raddle., MD;  Location: [facility] ENDOSCOPY;  Service: Gastroenterology;;   BILIARY DILATION  06/05/2021   Procedure:  BILIARY DILATION;  Surgeon: Wilhelmenia Aloha Raddle., MD;  Location: [facility] ENDOSCOPY;  Service: Gastroenterology;;   BILIARY DILATION  09/15/2021   Procedure: BILIARY DILATION;  Surgeon: Wilhelmenia Aloha Raddle., MD;  Location: [facility] ENDOSCOPY;  Service: Gastroenterology;;   BILIARY STENT PLACEMENT  01/08/2018   Procedure: BILIARY STENT PLACEMENT;  Surgeon: Charlanne Groom, MD;  Location: [facility] ENDOSCOPY;  Service: Endoscopy;;   BILIARY STENT PLACEMENT N/A 02/09/2018   Procedure: BILIARY STENT PLACEMENT;  Surgeon: Wilhelmenia Aloha Raddle., MD;  Location: [facility] ENDOSCOPY;  Service: Gastroenterology;  Laterality: N/A;   BILIARY STENT  PLACEMENT  04/01/2021   Procedure: BILIARY STENT PLACEMENT;  Surgeon: Charlanne Groom, MD;  Location: [facility] ENDOSCOPY;  Service: Endoscopy;;   BIOPSY  06/06/2018   Procedure: BIOPSY;  Surgeon: Wilhelmenia Aloha Raddle., MD;  Location: [facility] ENDOSCOPY;  Service: Gastroenterology;;   BIOPSY  04/01/2021   Procedure: BIOPSY;  Surgeon: Charlanne Groom, MD;  Location: [facility] ENDOSCOPY;  Service: Endoscopy;;   BIOPSY  06/05/2021   Procedure: BIOPSY;  Surgeon: Wilhelmenia Aloha Raddle., MD;  Location: [facility] ENDOSCOPY;  Service: Gastroenterology;;   BRONCHIAL BIOPSY  10/29/2022   Procedure: BRONCHIAL BIOPSIES;  Surgeon: Gladis Leonor HERO, MD;  Location: [facility] ENDOSCOPY;  Service: Pulmonary;;   BRONCHIAL WASHINGS  10/29/2022   Procedure: BRONCHIAL WASHINGS;  Surgeon: Gladis Leonor HERO, MD;  Location: [facility] ENDOSCOPY;  Service: Pulmonary;;   BRONCHOSCOPY  09/2022   benign biopsies, negative for eosinophils, granuloma, fungal (Meier)   CARDIAC CATHETERIZATION N/A 04/30/2015   Procedure: Left Heart Cath and Coronary Angiography;  Surgeon: Debby DELENA Sor, MD;  Location: [facility] INVASIVE CV LAB;  Service: Cardiovascular; LM nl, ost/p LAD 20%, patent mLAD stent, RI small, OM2 patent stent, pRCA 20%, patent stent, EF 45-50%-->Med Rx    cervical spine stimulator  08/2012   CHOLECYSTECTOMY  06/10/2015   Procedure: LAPAROSCOPIC CHOLECYSTECTOMY;  Surgeon: Herlene Righter Kinsinger, MD;  Location: [facility] OR;  Service: General;;   COLONOSCOPY  09/2013   TA x2, rpt 5 yrs (Pyrtle)   COLONOSCOPY WITH ESOPHAGOGASTRODUODENOSCOPY (EGD)  06/2018   reactive gastropathy, tubular adenoma, rpt colonoscopy 5 yrs (Mansouraty)   COLONOSCOPY WITH PROPOFOL  N/A 06/06/2018   Procedure: COLONOSCOPY WITH PROPOFOL ;  Surgeon: Wilhelmenia Aloha Raddle., MD;  Location: [facility] ENDOSCOPY;  Service: Gastroenterology;  Laterality: N/A;   CORONARY ANGIOPLASTY WITH STENT PLACEMENT  1995, 2000, 2005   stents in RCA, LAD & Cx-OM. -Stents were patent by cardiac catheterization in November  2016   ENDOSCOPIC RETROGRADE CHOLANGIOPANCREATOGRAPHY (ERCP) WITH PROPOFOL  N/A 02/09/2018   Procedure: ENDOSCOPIC RETROGRADE CHOLANGIOPANCREATOGRAPHY (ERCP) WITH PROPOFOL ;  Surgeon: Wilhelmenia Aloha Raddle., MD;  Location: [facility] ENDOSCOPY;  Service: Gastroenterology;  Laterality: N/A;   ENDOSCOPIC RETROGRADE CHOLANGIOPANCREATOGRAPHY (ERCP) WITH PROPOFOL  N/A 03/30/2018   Procedure: ENDOSCOPIC RETROGRADE CHOLANGIOPANCREATOGRAPHY (ERCP) WITH PROPOFOL ;  Surgeon: Wilhelmenia Aloha Raddle., MD;  Location: [facility] ENDOSCOPY;  Service: Gastroenterology;  Laterality: N/A;   ENDOSCOPIC RETROGRADE CHOLANGIOPANCREATOGRAPHY (ERCP) WITH PROPOFOL  N/A 06/05/2021   Procedure: ENDOSCOPIC RETROGRADE CHOLANGIOPANCREATOGRAPHY (ERCP) WITH PROPOFOL ;  Surgeon: Wilhelmenia Aloha Raddle., MD;  Location: [facility] ENDOSCOPY;  Service: Gastroenterology;  Laterality: N/A;   ENDOSCOPIC RETROGRADE CHOLANGIOPANCREATOGRAPHY (ERCP) WITH PROPOFOL  N/A 09/15/2021   Procedure: ENDOSCOPIC RETROGRADE CHOLANGIOPANCREATOGRAPHY (ERCP) WITH PROPOFOL ;  Surgeon: Wilhelmenia Aloha Raddle., MD;  Location: [facility] ENDOSCOPY;  Service: Gastroenterology;  Laterality: N/A;   ERCP N/A 01/08/2018   Procedure: ENDOSCOPIC RETROGRADE CHOLANGIOPANCREATOGRAPHY (ERCP);  Surgeon: Charlanne Groom, MD;  Location: [facility] ENDOSCOPY;  Service: Endoscopy;  Laterality: N/A;   ERCP N/A 04/01/2021   Procedure: ENDOSCOPIC RETROGRADE CHOLANGIOPANCREATOGRAPHY (ERCP);  Surgeon: Charlanne,  Lynnie, MD;  Location: [facility] ENDOSCOPY;  Service: Endoscopy;  Laterality: N/A;  Please schedule after 1 PM   ESOPHAGOGASTRODUODENOSCOPY N/A 03/30/2018   Procedure: ESOPHAGOGASTRODUODENOSCOPY (EGD);  Surgeon: Wilhelmenia Aloha Raddle., MD;  Location: [facility] ENDOSCOPY;  Service: Gastroenterology;  Laterality: N/A;   ESOPHAGOGASTRODUODENOSCOPY (EGD) WITH PROPOFOL  N/A 06/06/2018   Procedure: ESOPHAGOGASTRODUODENOSCOPY (EGD) WITH PROPOFOL ;  Surgeon: Wilhelmenia Aloha Raddle., MD;  Location: [facility] ENDOSCOPY;  Service: Gastroenterology;   Laterality: N/A;   ESOPHAGOGASTRODUODENOSCOPY (EGD) WITH PROPOFOL  N/A 07/06/2018   Procedure: ESOPHAGOGASTRODUODENOSCOPY (EGD) WITH PROPOFOL ;  Surgeon: Wilhelmenia Aloha Raddle., MD;  Location: [facility] ENDOSCOPY;  Service: Gastroenterology;  Laterality: N/A;   ESOPHAGOGASTRODUODENOSCOPY (EGD) WITH PROPOFOL  N/A 07/24/2021   Procedure: ESOPHAGOGASTRODUODENOSCOPY (EGD) WITH PROPOFOL  WITH FLUOROSCOPY;  Surgeon: Mansouraty, Aloha Raddle., MD;  Location: [facility] ENDOSCOPY;  Service: Gastroenterology;  Laterality: N/A;   EUS N/A 07/24/2021   Procedure: UPPER ENDOSCOPIC ULTRASOUND (EUS) RADIAL;  Surgeon: Wilhelmenia Aloha Raddle., MD;  Location: [facility] ENDOSCOPY;  Service: Gastroenterology;  Laterality: N/A;   lumbar spine stimulator     POLYPECTOMY  06/06/2018   Procedure: POLYPECTOMY;  Surgeon: Mansouraty, Aloha Raddle., MD;  Location: [facility] ENDOSCOPY;  Service: Gastroenterology;;   REMOVAL OF STONES  03/30/2018   Procedure: REMOVAL OF STONES;  Surgeon: Wilhelmenia Aloha Raddle., MD;  Location: [facility] ENDOSCOPY;  Service: Gastroenterology;;   REMOVAL OF STONES  06/05/2021   Procedure: REMOVAL OF STONES;  Surgeon: Wilhelmenia Aloha Raddle., MD;  Location: [facility] ENDOSCOPY;  Service: Gastroenterology;;   REMOVAL OF STONES  09/15/2021   Procedure: REMOVAL OF STONES;  Surgeon: Wilhelmenia Aloha Raddle., MD;  Location: [facility] ENDOSCOPY;  Service: Gastroenterology;;   RIGHT HEART CATH Right 11/11/2023   Procedure: RIGHT HEART CATH;  Surgeon: Anner Alm ORN, MD;  Location: [facility] INVASIVE CV LAB;  Service: Cardiovascular;  Laterality: Right;   SPHINCTEROTOMY  01/08/2018   Procedure: SPHINCTEROTOMY;  Surgeon: Charlanne Lynnie, MD;  Location: [facility] ENDOSCOPY;  Service: Endoscopy;;   SPHINCTEROTOMY  02/09/2018   Procedure: ANNETT;  Surgeon: Mansouraty, Aloha Raddle., MD;  Location: [facility] ENDOSCOPY;  Service: Gastroenterology;;   LAHOMA CHOLANGIOSCOPY N/A 06/05/2021   Procedure: DEBHOJDD CHOLANGIOSCOPY;  Surgeon: Wilhelmenia Aloha Raddle., MD;  Location:  [facility] ENDOSCOPY;  Service: Gastroenterology;  Laterality: N/A;   STENT REMOVAL  02/09/2018   Procedure: STENT REMOVAL;  Surgeon: Wilhelmenia Aloha Raddle., MD;  Location: [facility] ENDOSCOPY;  Service: Gastroenterology;;   CLEDA REMOVAL  03/30/2018   Procedure: STENT REMOVAL;  Surgeon: Wilhelmenia Aloha Raddle., MD;  Location: [facility] ENDOSCOPY;  Service: Gastroenterology;;   CLEDA REMOVAL  06/05/2021   Procedure: STENT REMOVAL;  Surgeon: Wilhelmenia Aloha Raddle., MD;  Location: [facility] ENDOSCOPY;  Service: Gastroenterology;;   TRANSTHORACIC ECHOCARDIOGRAM  04/2015   EF 40-45%, mod antsept, ant, antlat, apical HK, mild AI/MR, PASP .   TRANSTHORACIC ECHOCARDIOGRAM  08/23/2020   EF 55 to 60%.  No RWMA.  Mild LVH.  GR 1 DD.  Normal RV size and function with normal RVP.  Normal mitral and aortic valve.   UVULOPALATOPHARYNGOPLASTY, TONSILLECTOMY AND SEPTOPLASTY  2002   VIDEO BRONCHOSCOPY N/A 10/29/2022   Procedure: VIDEO BRONCHOSCOPY WITH FLUORO;  Surgeon: Gladis Leonor HERO, MD;  Location: [facility] ENDOSCOPY;  Service: Pulmonary;  Laterality: N/A;   Current Facility-Administered Medications  Medication Dose Route Frequency Provider Last Rate Last Admin   0.9 %  sodium chloride  infusion   Intravenous Continuous Mansouraty, Eldon Zietlow Jr., MD        Current Facility-Administered Medications:    0.9 %  sodium chloride  infusion, , Intravenous, Continuous, Mansouraty, Chestina Komatsu Jr.,  MD No Known Allergies Family History  Problem Relation Age of Onset   Diabetes Mother    Heart disease Mother    Cancer Sister 60       male cancer   Breast cancer Brother 64   Hypertension Maternal Aunt    Cancer Maternal Uncle        spinal   Coronary artery disease Neg Hx    Stroke Neg Hx    Colon cancer Neg Hx    Pancreatic cancer Neg Hx    Stomach cancer Neg Hx    Esophageal cancer Neg Hx    Inflammatory bowel disease Neg Hx    Liver disease Neg Hx    Rectal cancer Neg Hx    Colon polyps Neg Hx    Social History    Socioeconomic History   Marital status: Married    Spouse name: Robyn   Number of children: 3   Years of education: Not on file   Highest education level: Associate degree: occupational, Scientist, product/process development, or vocational program  Occupational History   Occupation: retired  Tobacco Use   Smoking status: Former    Current packs/day: 0.00    Types: Cigarettes    Quit date: 1972    Years since quitting: 53.8   Smokeless tobacco: Never   Tobacco comments:    was very light  Vaping Use   Vaping status: Never Used  Substance and Sexual Activity   Alcohol  use: Not Currently    Comment: ocassional -maybe 2 beers   Drug use: No   Sexual activity: Yes  Other Topics Concern   Not on file  Social History Narrative   Caffeine : 1 cup soda, occasional coffee   Lives with wife Quentin) and 28 yo son, 4 dogs   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   Activity: no regular activity   Diet: overall healthy, good fruits and vegetables, good amt water    Social Drivers of Corporate investment banker Strain: Low Risk  (11/08/2023)   Overall Financial Resource Strain (CARDIA)    Difficulty of Paying Living Expenses: Not very hard  Food Insecurity: No Food Insecurity (11/08/2023)   Hunger Vital Sign    Worried About Running Out of Food in the Last Year: Never true    Ran Out of Food in the Last Year: Never true  Transportation Needs: No Transportation Needs (11/08/2023)   PRAPARE - Administrator, Civil Service (Medical): No    Lack of Transportation (Non-Medical): No  Physical Activity: Insufficiently Active (11/08/2023)   Exercise Vital Sign    Days of Exercise per Week: 2 days    Minutes of Exercise per Session: 30 min  Stress: No Stress Concern Present (11/08/2023)   [employer] of Occupational Health - Occupational Stress Questionnaire    Feeling of Stress : Not at all  Social Connections: Moderately Integrated (11/08/2023)   Social  Connection and Isolation Panel    Frequency of Communication with Friends and Family: More than three times a week    Frequency of Social Gatherings with Friends and Family: More than three times a week    Attends Religious Services: Never    Database administrator or Organizations: Yes    Attends Engineer, structural: More than 4 times per year    Marital Status: Married  Catering manager Violence: Not At Risk (11/10/2022)   Humiliation, Afraid, Rape, and Kick questionnaire  Fear of Current or Ex-Partner: No    Emotionally Abused: No    Physically Abused: No    Sexually Abused: No    Physical Exam: There were no vitals filed for this visit. There is no height or weight on file to calculate BMI. GEN: NAD EYE: Sclerae anicteric ENT: MMM CV: Non-tachycardic GI: Soft, NT/ND NEURO:  Alert & Oriented x 3  Lab Results: No results for input(s): WBC, HGB, HCT, PLT in the last 72 hours. BMET No results for input(s): NA, K, CL, CO2, GLUCOSE, BUN, CREATININE, CALCIUM  in the last 72 hours. LFT No results for input(s): PROT, ALBUMIN, AST, ALT, ALKPHOS, BILITOT, BILIDIR, IBILI in the last 72 hours. PT/INR No results for input(s): LABPROT, INR in the last 72 hours.   Impression / Plan: This is a 73 y.o.male who presents for Colonoscopy for surveillance of previous adenomas and for EGD for evaluation of Upper GI pain and previous history of duodenal strictures requiring dilations.  The risks and benefits of endoscopic evaluation/treatment were discussed with the patient and/or family; these include but are not limited to the risk of perforation, infection, bleeding, missed lesions, lack of diagnosis, severe illness requiring hospitalization, as well as anesthesia and sedation related illnesses.  [name] history has been reviewed, patient examined, no change in status, and deemed stable for procedure.  The patient and/or family is  agreeable to proceed.    Aloha Finner, MD [facility] Advanced Endoscopy Office # [phone]

## 2024-03-22 NOTE — Discharge Instructions (Signed)

## 2024-03-22 NOTE — Op Note (Signed)
 [facility] Patient Name: Alexander [name] Procedure Date: 03/22/2024 MRN: [id number] Attending MD: Aloha Finner , MD, [id number] Date of Birth: 1950-07-27 CSN: [id number] Age: 73 Admit Type: Outpatient Procedure:                Upper GI endoscopy Indications:              Upper abdominal pain, Follow-up of duodenal                            stenosis; incidental diarrhea Providers:                Aloha Finner, MD, Willy Hummer, RN, Corene Southgate, Technician Referring MD:              Medicines:                Monitored Anesthesia Care Complications:            No immediate complications. Estimated Blood Loss:     Estimated blood loss was minimal. Procedure:                Pre-Anesthesia Assessment:                           - Prior to the procedure, a History and Physical                            was performed, and patient medications and                            allergies were reviewed. [name] tolerance of                            previous anesthesia was also reviewed. The risks                            and benefits of the procedure and the sedation                            options and risks were discussed with the patient.                            All questions were answered, and informed consent                            was obtained. Prior Anticoagulants: The patient has                            taken no anticoagulant or antiplatelet agents                            except for aspirin . ASA Grade Assessment: III - A  patient with severe systemic disease. After                            reviewing the risks and benefits, the patient was                            deemed in satisfactory condition to undergo the                            procedure.                           After obtaining informed consent, the endoscope was                            passed under direct vision.  Throughout the                            procedure, the patient's blood pressure, pulse, and                            oxygen  saturations were monitored continuously. The                            [id number]-H190 ([id number]) Olympus endoscope was introduced                            through the mouth, and advanced to the third part                            of duodenum. The upper GI endoscopy was                            accomplished without difficulty. The patient                            tolerated the procedure. Scope In: Scope Out: Findings:      No gross lesions were noted in the entire esophagus.      The Z-line was irregular and was found 37 cm from the incisors.      A J-shaped gastric deformity was found.      A single small angioectasia with no bleeding was found in the gastric       body.      Patchy mildly erythematous mucosa without bleeding was found in the       entire examined stomach. Biopsies were taken with a cold forceps for       histology and Helicobacter pylori testing.      A mild angulation deformity was found in the second portion of the       duodenum but the scope passes with ease.      No other gross lesions were noted in the duodenal bulb, in the first       portion of the duodenum, in the second portion of the duodenum, in the       area of the papilla and in the third portion of the  duodenum. Biopsies       were taken with a cold forceps for histology. Impression:               - No gross lesions in the entire esophagus. Z-line                            irregular, 37 cm from the incisors.                           - J-shaped gastric deformity.                           - A single non-bleeding angioectasia in the stomach.                           - Erythematous mucosa in the stomach. Biopsied.                           - Duodenal angulation deformity in D2 (scope passes                            with ease).                           - No gross lesions  in the duodenal bulb, in the                            first portion of the duodenum, in the second                            portion of the duodenum, in the area of the papilla                            and in the third portion of the duodenum. Biopsied. Moderate Sedation:      Not Applicable - Patient had care per Anesthesia. Recommendation:           - Proceed to scheduled colonoscopy.                           - Continue present medications.                           - Await pathology results.                           - Not clear he needs dilation of the angulation                            deformity with relative ease of passage of the                            endoscope into D3 at this time. If bloating  symptoms start recurring could consider.                           - The findings and recommendations were discussed                            with the patient.                           - The findings and recommendations were discussed                            with the patient's family. Procedure Code(s):        --- Professional ---                           [phone], Esophagogastroduodenoscopy, flexible,                            transoral; with biopsy, single or multiple Diagnosis Code(s):        --- Professional ---                           K22.89, Other specified disease of esophagus                           K31.89, Other diseases of stomach and duodenum                           R10.10, Upper abdominal pain, unspecified                           K31.5, Obstruction of duodenum CPT copyright 2022 American Medical Association. All rights reserved. The codes documented in this report are preliminary and upon coder review may  be revised to meet current compliance requirements. Aloha Finner, MD 03/22/2024 10:15:42 AM Number of Addenda: 0

## 2024-03-22 NOTE — Anesthesia Preprocedure Evaluation (Addendum)
 Anesthesia Evaluation  Patient identified by MRN, date of birth, ID band Patient awake    Reviewed: Allergy & Precautions, NPO status , Patient's Chart, lab work & pertinent test results  Airway Mallampati: II  TM Distance: >3 FB Neck ROM: Full    Dental no notable dental hx. (+) Partial Upper, Partial Lower   Pulmonary sleep apnea , former smoker   Pulmonary exam normal        Cardiovascular hypertension, + CAD   Rhythm:Regular Rate:Normal     Neuro/Psych  Headaches  negative psych ROS   GI/Hepatic Neg liver ROS,GERD  Medicated,,Colon polyps    Endo/Other  negative endocrine ROS    Renal/GU negative Renal ROS  negative genitourinary   Musculoskeletal  (+) Arthritis , Osteoarthritis,    Abdominal Normal abdominal exam  (+)   Peds  Hematology negative hematology ROS (+)   Anesthesia Other Findings   Reproductive/Obstetrics                              Anesthesia Physical Anesthesia Plan  ASA: 3  Anesthesia Plan: MAC   Post-op Pain Management:    Induction: Intravenous  PONV Risk Score and Plan: 1 and Propofol  infusion and Treatment may vary due to age or medical condition  Airway Management Planned: Simple Face Mask and Nasal Cannula  Additional Equipment: None  Intra-op Plan:   Post-operative Plan:   Informed Consent: I have reviewed the patients History and Physical, chart, labs and discussed the procedure including the risks, benefits and alternatives for the proposed anesthesia with the patient or authorized representative who has indicated his/her understanding and acceptance.     Dental advisory given  Plan Discussed with: CRNA  Anesthesia Plan Comments:          Anesthesia Quick Evaluation

## 2024-03-23 ENCOUNTER — Telehealth: Payer: Self-pay | Admitting: *Deleted

## 2024-03-23 LAB — SURGICAL PATHOLOGY

## 2024-03-23 NOTE — Telephone Encounter (Signed)
 Copied from CRM [id number]. Topic: Clinical - Medication Question >> Mar 23, 2024  1:47 PM [place] wrote: Reason for CRM: Patient wanting to know if he is able to increase medication tamsulosin  (FLOMAX ) 0.4 MG CAPS capsule, he think that current does is not working  anymore

## 2024-03-24 ENCOUNTER — Ambulatory Visit: Payer: Self-pay | Admitting: Gastroenterology

## 2024-03-24 NOTE — Telephone Encounter (Signed)
 Called patient Wife on dpr reviewed all information and repeated back to me. Will call if any questions. Will let us  know if any issues or if working so we can send in new script.

## 2024-03-24 NOTE — Telephone Encounter (Signed)
 Ok to try 2 tablets at home, watching for dizziness with standing side effect. If doing better, let me know and I will send in Rx with new instructions.

## 2024-03-30 ENCOUNTER — Encounter: Payer: Self-pay | Admitting: Family Medicine

## 2024-04-04 ENCOUNTER — Telehealth: Payer: Self-pay

## 2024-04-04 MED ORDER — TAMSULOSIN HCL 0.4 MG PO CAPS: 0.8000 mg | ORAL_CAPSULE | Freq: Every day | ORAL | 3 refills | Status: AC

## 2024-04-04 NOTE — Addendum Note (Signed)
 Addended by: RILLA BALLER on: 04/04/2024 05:41 PM   Modules accepted: Orders

## 2024-04-04 NOTE — Telephone Encounter (Signed)
 Copied from CRM [id number]. Topic: General - Other >> Apr 04, 2024 11:06 AM Vena HERO wrote: Reason for CRM: Pt called in to tell Dr KANDICE that the increased dosage of tamsulosin  is working well and to please send it into the [facility]

## 2024-04-04 NOTE — Telephone Encounter (Signed)
 Flomax refilled.

## 2024-05-08 ENCOUNTER — Other Ambulatory Visit: Payer: Self-pay | Admitting: Internal Medicine

## 2024-06-07 ENCOUNTER — Ambulatory Visit

## 2024-06-07 DIAGNOSIS — J849 Interstitial pulmonary disease, unspecified: Secondary | ICD-10-CM | POA: Diagnosis not present

## 2024-06-07 DIAGNOSIS — Z5181 Encounter for therapeutic drug level monitoring: Secondary | ICD-10-CM

## 2024-06-07 DIAGNOSIS — Z79899 Other long term (current) drug therapy: Secondary | ICD-10-CM

## 2024-06-07 DIAGNOSIS — R0902 Hypoxemia: Secondary | ICD-10-CM

## 2024-06-07 DIAGNOSIS — I2723 Pulmonary hypertension due to lung diseases and hypoxia: Secondary | ICD-10-CM

## 2024-06-07 DIAGNOSIS — Z2989 Encounter for other specified prophylactic measures: Secondary | ICD-10-CM

## 2024-06-07 DIAGNOSIS — J679 Hypersensitivity pneumonitis due to unspecified organic dust: Secondary | ICD-10-CM

## 2024-06-07 LAB — PULMONARY FUNCTION TEST
DL/VA % pred: 61 %
DL/VA: 2.44 ml/min/mmHg/L
DLCO unc % pred: 33 %
DLCO unc: 8.37 ml/min/mmHg
FEF 25-75 Pre: 2.69 L/s
FEF2575-%Pred-Pre: 118 %
FEV1-%Pred-Pre: 61 %
FEV1-Pre: 1.92 L
FEV1FVC-%Pred-Pre: 116 %
FEV6-%Pred-Pre: 56 %
FEV6-Pre: 2.26 L
FEV6FVC-%Pred-Pre: 106 %
FVC-%Pred-Pre: 52 %
FVC-Pre: 2.26 L
Pre FEV1/FVC ratio: 85 %
Pre FEV6/FVC Ratio: 100 %

## 2024-06-07 NOTE — Patient Instructions (Signed)
Spiro/DLCO performed today. 

## 2024-06-07 NOTE — Progress Notes (Signed)
Spiro/DLCO performed today. 

## 2024-06-08 ENCOUNTER — Other Ambulatory Visit: Payer: Self-pay | Admitting: Family Medicine

## 2024-06-08 ENCOUNTER — Other Ambulatory Visit: Payer: Self-pay | Admitting: Internal Medicine

## 2024-06-08 DIAGNOSIS — J849 Interstitial pulmonary disease, unspecified: Secondary | ICD-10-CM

## 2024-06-08 DIAGNOSIS — J679 Hypersensitivity pneumonitis due to unspecified organic dust: Secondary | ICD-10-CM

## 2024-06-14 ENCOUNTER — Telehealth: Payer: Self-pay

## 2024-06-14 ENCOUNTER — Other Ambulatory Visit (HOSPITAL_COMMUNITY): Payer: Self-pay

## 2024-06-14 NOTE — Telephone Encounter (Signed)
.  Submitted a Prior Authorization request to [facility] for TYVASO DPI via CoverMyMeds. Authorization has been CANCELLED due to there already being an approval on file from 06/01/24 - 05/31/25     Patient can continue to fill through [facility]: [phone]   Authorization#: [id number]   Per test claim copay for 28 day supply is $2100. Pt enrolled in Pulmonary Hypertension grant through [employer]:  Amount: $6500 Award Period: 05/15/24 - 05/14/25 BIN: [id number] PCN: [id number] Group: [id number] ID: [id number]  Helpdesk: [phone]

## 2024-06-14 NOTE — Telephone Encounter (Signed)
 Refill sent for CellCept  to [facility]  Dose: 1500mg  BID   Last OV: 01/27/24 Provider: Dr. Geronimo Pertinent labs: CBC, BMP, LFTs 01/27/24  Next OV: overdue - due December 2025  Routing to scheduling team for follow-up on appt scheduling  Aleck Puls, PharmD, BCPS Clinical Pharmacist  [facility]

## 2024-06-27 ENCOUNTER — Ambulatory Visit: Admitting: Internal Medicine

## 2024-06-29 ENCOUNTER — Telehealth: Payer: Self-pay | Admitting: *Deleted

## 2024-06-29 NOTE — Telephone Encounter (Signed)
 Copied from CRM [id number]. Topic: Clinical - Medication Refill >> Jun 26, 2024  8:35 AM Leila C wrote: Patient's spouse Alexander [name] [phone] states the weather is bad and cannot make 06/27/24 at 8:30 am with Dr. Geronimo to 10/12/24 at 8:30 am and placed on wait list. Please can patient be seen sooner, had to reschedule due to the weather? Alexander [name] states patient will not have enough medications until office visit 10/12/24. Please advise and call back/Mychart.  Patient reached out to the pharmacy for refill. [facility]ill [facility] and has not heard from the office. Alexander [name] is asking for refills for predniSONE  (DELTASONE ) 10 MG tablet, OFEV  150 MG CAPS, and sulfamethoxazole -trimethoprim  (BACTRIM  DS) 800-160 MG tablet.   [facility] - [place], [place] -  [address] [place] [address] Phone:[phone]  Alexander [name] states received message on Treprostinil (TYVASO DPI MAINTENANCE KIT) 64 MCG POWD and will contact the pharmacy.

## 2024-07-03 ENCOUNTER — Other Ambulatory Visit (HOSPITAL_BASED_OUTPATIENT_CLINIC_OR_DEPARTMENT_OTHER): Payer: Self-pay

## 2024-07-03 DIAGNOSIS — Z2989 Encounter for other specified prophylactic measures: Secondary | ICD-10-CM

## 2024-07-03 DIAGNOSIS — Z79899 Other long term (current) drug therapy: Secondary | ICD-10-CM

## 2024-07-03 MED ORDER — PREDNISONE 10 MG PO TABS: 10.0000 mg | ORAL_TABLET | Freq: Every day | ORAL | 0 refills | Status: AC

## 2024-07-03 MED ORDER — SULFAMETHOXAZOLE-TRIMETHOPRIM 800-160 MG PO TABS: 1.0000 | ORAL_TABLET | ORAL | 1 refills | Status: AC

## 2024-07-03 NOTE — Telephone Encounter (Signed)
 OFEV  refill

## 2024-10-12 ENCOUNTER — Ambulatory Visit: Admitting: Internal Medicine
